# Patient Record
Sex: Female | Born: 1950 | Race: Black or African American | Hispanic: No | Smoking: Current every day smoker
Health system: Southern US, Community
[De-identification: ages and names within clinical notes are randomized; demographics above are authoritative.]

## PROBLEM LIST (undated history)

## (undated) DIAGNOSIS — F329 Major depressive disorder, single episode, unspecified: Secondary | ICD-10-CM

## (undated) DIAGNOSIS — G4730 Sleep apnea, unspecified: Secondary | ICD-10-CM

## (undated) DIAGNOSIS — I10 Essential (primary) hypertension: Secondary | ICD-10-CM

## (undated) DIAGNOSIS — E785 Hyperlipidemia, unspecified: Secondary | ICD-10-CM

## (undated) DIAGNOSIS — K219 Gastro-esophageal reflux disease without esophagitis: Secondary | ICD-10-CM

## (undated) DIAGNOSIS — K5790 Diverticulosis of intestine, part unspecified, without perforation or abscess without bleeding: Secondary | ICD-10-CM

## (undated) DIAGNOSIS — G5601 Carpal tunnel syndrome, right upper limb: Secondary | ICD-10-CM

## (undated) DIAGNOSIS — K5520 Angiodysplasia of colon without hemorrhage: Secondary | ICD-10-CM

## (undated) DIAGNOSIS — R519 Headache, unspecified: Secondary | ICD-10-CM

## (undated) DIAGNOSIS — F32A Depression, unspecified: Secondary | ICD-10-CM

## (undated) DIAGNOSIS — E079 Disorder of thyroid, unspecified: Secondary | ICD-10-CM

## (undated) DIAGNOSIS — N289 Disorder of kidney and ureter, unspecified: Secondary | ICD-10-CM

## (undated) DIAGNOSIS — G4700 Insomnia, unspecified: Secondary | ICD-10-CM

## (undated) DIAGNOSIS — R809 Proteinuria, unspecified: Secondary | ICD-10-CM

## (undated) HISTORY — DX: Diverticulosis of intestine, part unspecified, without perforation or abscess without bleeding: K57.90

## (undated) HISTORY — DX: Hyperlipidemia, unspecified: E78.5

## (undated) HISTORY — DX: Proteinuria, unspecified: R80.9

## (undated) HISTORY — DX: Insomnia, unspecified: G47.00

## (undated) HISTORY — DX: Headache, unspecified: R51.9

## (undated) HISTORY — DX: Gastro-esophageal reflux disease without esophagitis: K21.9

## (undated) HISTORY — DX: Essential (primary) hypertension: I10

## (undated) HISTORY — DX: Angiodysplasia of colon without hemorrhage: K55.20

## (undated) HISTORY — DX: Sleep apnea, unspecified: G47.30

## (undated) HISTORY — PX: BACK SURGERY: SHX140

## (undated) HISTORY — PX: KNEE ARTHROSCOPY: SUR90

## (undated) HISTORY — DX: Carpal tunnel syndrome, right upper limb: G56.01

## (undated) HISTORY — DX: Disorder of thyroid, unspecified: E07.9

## (undated) HISTORY — PX: TOTAL ABDOMINAL HYSTERECTOMY W/ BILATERAL SALPINGOOPHORECTOMY: SHX83

## (undated) HISTORY — PX: BILATERAL OOPHORECTOMY: SHX1221

## (undated) HISTORY — DX: Disorder of kidney and ureter, unspecified: N28.9

## (undated) HISTORY — DX: Depression, unspecified: F32.A

## (undated) HISTORY — DX: Major depressive disorder, single episode, unspecified: F32.9

---

## 1982-10-16 HISTORY — PX: ABDOMINAL HYSTERECTOMY: SHX81

## 2000-05-08 ENCOUNTER — Other Ambulatory Visit: Admission: RE | Admit: 2000-05-08 | Discharge: 2000-05-08 | Payer: Self-pay | Admitting: *Deleted

## 2001-03-25 ENCOUNTER — Encounter: Payer: Self-pay | Admitting: Family Medicine

## 2001-03-25 ENCOUNTER — Ambulatory Visit (HOSPITAL_COMMUNITY): Admission: RE | Admit: 2001-03-25 | Discharge: 2001-03-25 | Payer: Self-pay | Admitting: Family Medicine

## 2001-07-04 ENCOUNTER — Emergency Department (HOSPITAL_COMMUNITY): Admission: EM | Admit: 2001-07-04 | Discharge: 2001-07-04 | Payer: Self-pay | Admitting: Emergency Medicine

## 2002-08-29 ENCOUNTER — Ambulatory Visit (HOSPITAL_COMMUNITY): Admission: RE | Admit: 2002-08-29 | Discharge: 2002-08-29 | Payer: Self-pay | Admitting: Family Medicine

## 2002-08-29 ENCOUNTER — Encounter: Payer: Self-pay | Admitting: Family Medicine

## 2003-09-03 ENCOUNTER — Ambulatory Visit (HOSPITAL_COMMUNITY): Admission: RE | Admit: 2003-09-03 | Discharge: 2003-09-03 | Payer: Self-pay | Admitting: Family Medicine

## 2005-01-03 ENCOUNTER — Ambulatory Visit (HOSPITAL_COMMUNITY): Admission: RE | Admit: 2005-01-03 | Discharge: 2005-01-03 | Payer: Self-pay | Admitting: Family Medicine

## 2005-01-17 ENCOUNTER — Encounter: Admission: RE | Admit: 2005-01-17 | Discharge: 2005-01-17 | Payer: Self-pay | Admitting: Family Medicine

## 2005-03-09 ENCOUNTER — Ambulatory Visit (HOSPITAL_COMMUNITY): Admission: RE | Admit: 2005-03-09 | Discharge: 2005-03-09 | Payer: Self-pay | Admitting: Family Medicine

## 2005-08-07 ENCOUNTER — Emergency Department (HOSPITAL_COMMUNITY): Admission: EM | Admit: 2005-08-07 | Discharge: 2005-08-07 | Payer: Self-pay | Admitting: Emergency Medicine

## 2005-08-09 ENCOUNTER — Ambulatory Visit (HOSPITAL_COMMUNITY): Admission: RE | Admit: 2005-08-09 | Discharge: 2005-08-09 | Payer: Self-pay | Admitting: Family Medicine

## 2005-12-21 ENCOUNTER — Ambulatory Visit (HOSPITAL_COMMUNITY): Admission: RE | Admit: 2005-12-21 | Discharge: 2005-12-21 | Payer: Self-pay | Admitting: Family Medicine

## 2007-05-16 ENCOUNTER — Ambulatory Visit (HOSPITAL_BASED_OUTPATIENT_CLINIC_OR_DEPARTMENT_OTHER): Admission: RE | Admit: 2007-05-16 | Discharge: 2007-05-16 | Payer: Self-pay | Admitting: Orthopedic Surgery

## 2007-11-08 ENCOUNTER — Ambulatory Visit (HOSPITAL_COMMUNITY): Admission: RE | Admit: 2007-11-08 | Discharge: 2007-11-08 | Payer: Self-pay | Admitting: Family Medicine

## 2007-11-22 ENCOUNTER — Encounter: Admission: RE | Admit: 2007-11-22 | Discharge: 2007-11-22 | Payer: Self-pay | Admitting: Family Medicine

## 2008-10-16 DIAGNOSIS — N289 Disorder of kidney and ureter, unspecified: Secondary | ICD-10-CM

## 2008-10-16 HISTORY — DX: Disorder of kidney and ureter, unspecified: N28.9

## 2008-11-10 ENCOUNTER — Ambulatory Visit (HOSPITAL_COMMUNITY): Admission: RE | Admit: 2008-11-10 | Discharge: 2008-11-10 | Payer: Self-pay | Admitting: Family Medicine

## 2008-11-17 ENCOUNTER — Ambulatory Visit (HOSPITAL_COMMUNITY): Admission: RE | Admit: 2008-11-17 | Discharge: 2008-11-17 | Payer: Self-pay | Admitting: Family Medicine

## 2008-12-29 ENCOUNTER — Ambulatory Visit: Payer: Self-pay | Admitting: Thoracic Surgery

## 2009-03-24 ENCOUNTER — Encounter: Admission: RE | Admit: 2009-03-24 | Discharge: 2009-03-24 | Payer: Self-pay | Admitting: Thoracic Surgery

## 2009-03-24 ENCOUNTER — Ambulatory Visit: Payer: Self-pay | Admitting: Thoracic Surgery

## 2009-12-22 ENCOUNTER — Encounter: Admission: RE | Admit: 2009-12-22 | Discharge: 2009-12-22 | Payer: Self-pay | Admitting: Cardiothoracic Surgery

## 2009-12-22 ENCOUNTER — Ambulatory Visit: Payer: Self-pay | Admitting: Thoracic Surgery

## 2011-01-10 ENCOUNTER — Ambulatory Visit (HOSPITAL_COMMUNITY)
Admission: RE | Admit: 2011-01-10 | Discharge: 2011-01-10 | Disposition: A | Discharge status: Home / Self Care | Payer: BC Managed Care – PPO | Source: Ambulatory Visit | Attending: Family Medicine | Admitting: Family Medicine

## 2011-01-10 ENCOUNTER — Other Ambulatory Visit (HOSPITAL_COMMUNITY): Payer: Self-pay | Admitting: Family Medicine

## 2011-01-10 DIAGNOSIS — M25559 Pain in unspecified hip: Secondary | ICD-10-CM | POA: Insufficient documentation

## 2011-01-10 DIAGNOSIS — M545 Low back pain, unspecified: Secondary | ICD-10-CM | POA: Insufficient documentation

## 2011-01-10 DIAGNOSIS — M5430 Sciatica, unspecified side: Secondary | ICD-10-CM

## 2011-02-28 NOTE — Op Note (Signed)
Joanna [name], Draghi Asmaa                  ACCOUNT NO.:  [id number]   MEDICAL RECORD NO.:  [id number]          PATIENT TYPE:  AMB   LOCATION:  [facility]                         FACILITY:  [facility]   PHYSICIAN:  Gaynelle Arabian, M.D.    DATE OF BIRTH:  24-Mar-1951   DATE OF PROCEDURE:  05/16/2007  DATE OF DISCHARGE:                               OPERATIVE REPORT   PREOPERATIVE DIAGNOSIS:  Right knee medial meniscal tear.   POSTOPERATIVE DIAGNOSIS:  Right knee medial meniscal tear.   PROCEDURE:  Right knee arthroscopy with debridement.   SURGEON:  Gaynelle Arabian, M.D.   ASSISTANT:  None.   ANESTHESIA:  General.   BLOOD LOSS:  Minimal.   DRAINS:  None.   COMPLICATIONS:  None.   CONDITION:  Stable to the recovery room.   CLINICAL NOTE:  [name] is a 60 year old female with a long history right  knee pain and mechanical symptoms.  She had a medial meniscal tear with  progressively worsening discomfort.  She presents now for arthroscopy  and debridement.   PROCEDURE IN DETAIL:  After the successful administration of general  anesthetic, a tourniquet was placed high on the right thigh.  The right  lower extremity was prepped and draped in the usual sterile fashion.  Standard superomedial and inferolateral incisions were made and inflow  cannula passed superomedial, camera passed inferolateral.  Arthroscopic  visualization proceeds.  The under surface of the patella and trochlea  looked normal.  The medial and lateral gutters were visualized and  looked normal.  Flexion and valgus force applied to the knee and the  medial compartment was entered.  The spinal needle was used to localize  the inferomedial portal.  A small incision made, dilator placed, and we  noted that there was a significant tear in the body and posterior horn  of the medial meniscus.  The chondral surfaces looked normal.  I used a  combination of baskets and 4.2 mm shaver to debride the meniscus back to  a stable base and then  sealed it off with the ArthroCare device.  It is  found to be stable post debridement.  Again, the chondral surfaces are  inspected and they looked normal.  I then visualized the intercondylar  notch and the ACL was normal.  The lateral compartment was also normal.  The joint was again inspected and there was no evidence of any other  tears, loose bodies, or chondral defects.  I then removed the  arthroscopic equipment from the inferior portals which are closed with  interrupted 4-0 nylon.  20 mL of 0.25% Marcaine with epi are injected  through the inflow cannula and then that is removed and that portal  closed with nylon.  A bulky sterile dressing is applied and she is  awakened and transported to recovery in stable condition.      Gaynelle Arabian, M.D.  Electronically Signed     FA/MEDQ  D:  05/16/2007  T:  05/16/2007  Job:  [id number]

## 2011-02-28 NOTE — Letter (Signed)
March 24, 2009   W. Rosemary Holms, MD  [address] [place], [place] [address]   Re:  [name], Joanna R                  DOB:  12-Jan-1951   Dear Dr. Wolfgang Phoenix:   I saw the patient back today.  Her blood pressure is 137/100, pulse 88,  respirations 18, and saturations were 95%.  Her lungs were clear to  auscultation and percussion.  Her CT scan showed no other change in her  right lower lobe nodule.  Because this has been stable and given the  size of this nodule, we will see her again in 9 months with a CT scan.   Nicanor Alcon, M.D.  Electronically Signed   DPB/MEDQ  D:  03/24/2009  T:  03/25/2009  Job:  [id number]

## 2011-02-28 NOTE — Letter (Signed)
December 29, 2008   [name]. Wolfgang Phoenix, [place], [place], [place] [address]   Re:  Joanna [name], Joanna [name] [place] R                  DOB:  03-22-1951   Dear Dr. Wolfgang Phoenix:   I appreciate the opportunity of seeing the patient.  She is a 60-year-  old smoker was found to have a 3-mm nodule on the right lower lobe on CT  scan.  She is referred here for evaluation.  This is not evidenced  apparently on previous x-rays.  She also has chronic disease and chronic  obstructive pulmonary disease.  Her problems include hypertension,  diabetes mellitus type 2, hypothyroidism, and depression.   ALLERGIES:  She has no allergies.   FAMILY HISTORY:  Positive for cardiac disease.   MEDICATIONS:  Norvasc 10 mg a day, Synthroid 0.15 mcg a day, potassium,  Demadex 20 mg a day, omeprazole 40 mg a day, Wellbutrin 150 mg twice a  day, and metformin 500 mg twice a day, Ambien 10 mg p.r.n., indapamide  2.5 mg daily, and benzoate p.r.n.   SOCIAL HISTORY:  She is single and has 2 children.  Works as an  Mining engineer.  She smokes a 13 pack a day.  Does not drink alcohol on a  regular basis.   REVIEW OF SYSTEMS:  Vital Signs:  She is 5 feet 2 inches.  Cardiac:  Her  weight has been stable.  Cardiac:  No angina or atrial fibrillation.  Pulmonary:  She has had bronchitis or wheezing.  GI:  She got reflux,  diarrhea, and constipation.  GU:  She had frequent urination.  No kidney  disease.  Vascular:  No claudication, DVT, or TIAs.  Neurological:  He  has got headaches.  No blackouts or seizures.  Musculoskeletal:  She has  had a rash.  No arthritis.  Psychiatric:  Depression.  Eye/ENT:  No  change in eyesight or hearing.  Hematological:  No problems with  bleeding, clotting disorders, or anemia.   PHYSICAL EXAMINATION:  VITAL SIGNS:  Her blood pressure is 132/92, pulse  100, respirations 18, and sats are 95%.  HEAD, EYES, EARS, NOSE, AND THROAT:  Unremarkable.  NECK:  Supple without thyromegaly.  There is no  supraclavicular or  axillary adenopathy.  LUNGS:  Clear to auscultation and percussion.  HEART:  Regular sinus rhythm.  No murmur.  ABDOMEN:  Soft.  There is no hepatosplenomegaly.  EXTREMITIES:  Pulses are 2+.  There is no clubbing or edema.  NEUROLOGICAL:  She is oriented x3.  Sensory and motor intact.  Cranial  nerves intact.   IMPRESSION:  I feel that a 3-mm nodule just needed to be followed.  I  did explain to her that there is no chest that we can do other than open  biopsy to find out about this fresh her and I would not recommend  surgery at this time.  We will see her again in 3 months with another CT  scan to explain to her if this starts getting larger, then intervention  would be recommended.   Nicanor Alcon, M.D.  Electronically Signed   DPB/MEDQ  D:  12/29/2008  T:  12/29/2008  Job:  [id number]

## 2011-02-28 NOTE — Letter (Signed)
December 22, 2009   [name]. Wolfgang Phoenix, MD  [address] [place], [place] [address]   Re:  Joanna [name], Joanna R                  DOB:  December 28, 1950   Dear Dr. Wolfgang Phoenix:   I saw the patient back today.  We were following her for a small 2-mm  nodule in the right lower lobe.  We got a chest x-ray today and really  did not show the nodule.  I feel since there is although that is a  really small nodule, I have seen that not increased in size over the  last 9 months.  I would recommend that she get a CT scan, chest x-ray at  least once a year for the next 2-3 years, and I will be happy to see her  back if there is any more concern regarding her nodule.  Her blood  pressure is 132/93, pulse 100, respirations 16, sats were 97%.   Joanna [name], M.D.  Electronically Signed   DPB/MEDQ  D:  12/22/2009  T:  12/23/2009  Job:  [id number]

## 2011-05-26 ENCOUNTER — Other Ambulatory Visit: Payer: Self-pay | Admitting: Family Medicine

## 2011-05-26 DIAGNOSIS — M549 Dorsalgia, unspecified: Secondary | ICD-10-CM

## 2011-05-30 ENCOUNTER — Ambulatory Visit (HOSPITAL_COMMUNITY)
Admission: RE | Admit: 2011-05-30 | Discharge: 2011-05-30 | Disposition: A | Discharge status: Home / Self Care | Payer: BC Managed Care – PPO | Source: Ambulatory Visit | Attending: Family Medicine | Admitting: Family Medicine

## 2011-05-30 DIAGNOSIS — M5126 Other intervertebral disc displacement, lumbar region: Secondary | ICD-10-CM | POA: Insufficient documentation

## 2011-05-30 DIAGNOSIS — M79609 Pain in unspecified limb: Secondary | ICD-10-CM | POA: Insufficient documentation

## 2011-05-30 DIAGNOSIS — M545 Low back pain, unspecified: Secondary | ICD-10-CM | POA: Insufficient documentation

## 2011-05-30 DIAGNOSIS — M549 Dorsalgia, unspecified: Secondary | ICD-10-CM

## 2011-07-31 LAB — POCT I-STAT 4, (NA,K, GLUC, HGB,HCT)
Glucose, Bld: 104 — ABNORMAL HIGH
HCT: 38
Hemoglobin: 12.9
Operator id: 268271
Potassium: 3.6
Sodium: 142

## 2011-12-22 ENCOUNTER — Telehealth: Payer: Self-pay

## 2011-12-22 NOTE — Telephone Encounter (Signed)
Per Crystal, Dr. [name] called and wanted this pt referred for colonoscopy. When I called pt, she is not having any rectal bleeding, diarrhea or constipation but is having some reflux. OV scheduled for 12/27/2011 with Vickey Huger, NP. Letter being sent to Dr. Wolfgang Phoenix to inform of appt.

## 2011-12-27 ENCOUNTER — Encounter: Payer: Self-pay | Admitting: Urgent Care

## 2011-12-27 ENCOUNTER — Ambulatory Visit (INDEPENDENT_AMBULATORY_CARE_PROVIDER_SITE_OTHER): Payer: BC Managed Care – PPO | Admitting: Urgent Care

## 2011-12-27 DIAGNOSIS — K219 Gastro-esophageal reflux disease without esophagitis: Secondary | ICD-10-CM

## 2011-12-27 DIAGNOSIS — Z1211 Encounter for screening for malignant neoplasm of colon: Secondary | ICD-10-CM

## 2011-12-27 MED ORDER — SOD PICOSULFATE-MAG OX-CIT ACD 10-3.5-12 MG-GM-GM PO PACK: 1.0000 | PACK | Freq: Once | ORAL | Status: DC

## 2011-12-27 NOTE — Progress Notes (Signed)
Referring Provider: [name], MD Primary Care Physician:  [name], MD, MD Primary Gastroenterologist:  Dr. Oneida Alar  Chief Complaint  Patient presents with  . Colonoscopy  . Gastrophageal Reflux    Refractory   HPI:  Joanna [name] is a 61 y.o. female here as a referral from Dr. Wolfgang Phoenix for screening colonoscopy. Upon triage by the nurse, she complains of refractory GERD. She was brought in the office for further evaluation. She describes a 12 yr hx of GERD.  She admits to eating late due to working 12-hr shifts.  +Heartburn several days per month.  Takes omeprazole 20mg  daily.  Not skipping doses.  Denies dysphagia or pdynophagia.  Denies anorexia, N/V.   Denies any lower GI symptoms including constipation, diarrhea, rectal bleeding, melena or weight loss. She an EGD 11 years ago elsewhere but does not remember findings. She denies any problems with sedation.  Past Medical History  Diagnosis Date  . GERD (gastroesophageal reflux disease)   . Diabetes mellitus   . Hypertension   . Depression   . Insomnia     Past Surgical History  Procedure Date  . Total abdominal hysterectomy w/ bilateral salpingoophorectomy   . Knee arthroscopy     Current Outpatient Prescriptions  Medication Sig Dispense Refill  . buPROPion (WELLBUTRIN SR) 150 MG 12 hr tablet Take 150 mg by mouth 2 (two) times daily. for tobacco cessation      . HYDROcodone-acetaminophen (NORCO) 10-325 MG per tablet Take 1 tablet by mouth every 6 (six) hours as needed.       . indapamide (LOZOL) 2.5 MG tablet Take 2.5 mg by mouth every morning.      Marland Kitchen ketorolac (TORADOL) 10 MG tablet Take 10 mg by mouth Once daily as needed.      Marland Kitchen levothyroxine (SYNTHROID, LEVOTHROID) 100 MCG tablet Take 100 mcg by mouth daily.       Marland Kitchen losartan (COZAAR) 100 MG tablet Take 100 mg by mouth daily.       . metFORMIN (GLUCOPHAGE) 500 MG tablet Take 500 mg by mouth 2 (two) times daily with a meal.       . omeprazole (PRILOSEC) 40 MG capsule  Take 40 mg by mouth daily.       . pravastatin (PRAVACHOL) 80 MG tablet Take 40 mg by mouth daily. 1/2 tablet      . triamcinolone cream (KENALOG) 0.1 %       . zolpidem (AMBIEN) 5 MG tablet Take 5 mg by mouth at bedtime as needed. 2 tablets at bedtime-only when she works nights       . Sod Picosulfate-Mag Ox-Cit Acd ([place]) 10-3.5-12 MG-GM-GM PACK Take 1 kit by mouth once.  1 each  0    Allergies as of 12/27/2011  . (No Known Allergies)    Family History:There is no known family history of colorectal carcinoma , liver disease, or inflammatory bowel disease.  Problem Relation Age of Onset  . Cardiomyopathy Father   . Heart failure Mother   . Stroke Brother   . Kidney cancer Brother     History   Social History  . Marital Status: Divorced    Spouse Name: N/A    Number of Children: 2  . 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 Smoking status: Former Smoker -- 0.5 packs/day for 30 years    Types: Cigarettes    Quit date: 10/16/2010  .  Smokeless tobacco: Former Systems developer    Quit date: 10/28/2010  . Alcohol Use: Yes     once per mo, socially  . Drug Use: No  . Sexually Active: Not on file  Review of Systems: Gen: Denies any fever, chills, sweats, anorexia, fatigue, weakness, malaise, weight loss, and sleep disorder CV: Denies chest pain, angina, palpitations, syncope, orthopnea, PND, peripheral edema, and claudication. Resp: Denies dyspnea at rest, dyspnea with exercise, cough, sputum, wheezing, coughing up blood, and pleurisy. GI: Denies vomiting blood, jaundice, and fecal incontinence.  GU : Denies urinary burning, blood in urine, urinary frequency, urinary hesitancy, nocturnal urination, and urinary incontinence. MS: Denies joint pain, limitation of movement, and swelling, stiffness, low back pain, extremity pain. Denies muscle weakness, cramps, atrophy.  Derm: Denies rash, itching, dry skin, hives, moles, warts, or  unhealing ulcers.  Psych: Denies anxiety, memory loss, suicidal ideation, hallucinations, paranoia, and confusion. Heme: Denies bruising, bleeding, and enlarged lymph nodes. Neuro:  Denies any headaches, dizziness, paresthesias. Endo:  Denies any problems with DM, thyroid, adrenal function.  Physical Exam: BP 121/80  Pulse 114  Temp(Src) 97.3 F (36.3 C) (Temporal)  Ht 5\' 2"  (1.575 m)  Wt 185 lb (83.915 kg)  BMI 33.84 kg/m2 General:   Alert,  Well-developed, well-nourished, pleasant and cooperative in NAD Head:  Normocephalic and atraumatic. Eyes:  Sclera clear, no icterus.   Conjunctiva pink. Ears:  Normal auditory acuity. Nose:  No deformity, discharge, or lesions. Mouth:  No deformity or lesions,oropharynx pink & moist. Neck:  Supple; no masses or thyromegaly. Lungs:  Clear throughout to auscultation.   No wheezes, crackles, or rhonchi. No acute distress. Heart:  Regular rate and rhythm; no murmurs, clicks, rubs,  or gallops. Abdomen:  Normal bowel sounds.  No bruits.  Soft, non-tender and non-distended without masses, hepatosplenomegaly or hernias noted.  No guarding or rebound tenderness.   Rectal:  Deferred. Msk:  Symmetrical without gross deformities. Normal posture. Pulses:  Normal pulses noted. Extremities:  No clubbing or edema. Neurologic:  Alert and oriented x4;  grossly normal neurologically. Skin:  Intact without significant lesions or rashes. Lymph Nodes:  No significant cervical adenopathy. Psych:  Alert and cooperative. Normal mood and affect.

## 2011-12-27 NOTE — Progress Notes (Signed)
Faxed to PCP

## 2011-12-27 NOTE — Patient Instructions (Signed)
You need a colonoscopy & EGD with Dr. Oneida Alar  Continue omeprazole 20mg  daily for now, this may need to be increased or changed after procedure  Gastroesophageal Reflux Disease, Adult Gastroesophageal reflux disease (GERD) happens when acid from your stomach flows up into the esophagus. When acid comes in contact with the esophagus, the acid causes soreness (inflammation) in the esophagus. Over time, GERD may create small holes (ulcers) in the lining of the esophagus. CAUSES   Increased body weight. This puts pressure on the stomach, making acid rise from the stomach into the esophagus.   Smoking. This increases acid production in the stomach.   Drinking alcohol. This causes decreased pressure in the lower esophageal sphincter (valve or ring of muscle between the esophagus and stomach), allowing acid from the stomach into the esophagus.   Late evening meals and a full stomach. This increases pressure and acid production in the stomach.   A malformed lower esophageal sphincter.  Sometimes, no cause is found. SYMPTOMS   Burning pain in the lower part of the mid-chest behind the breastbone and in the mid-stomach area. This may occur twice a week or more often.   Trouble swallowing.   Sore throat.   Dry cough.   Asthma-like symptoms including chest tightness, shortness of breath, or wheezing.  DIAGNOSIS  Your caregiver may be able to diagnose GERD based on your symptoms. In some cases, X-rays and other tests may be done to check for complications or to check the condition of your stomach and esophagus. TREATMENT  Your caregiver may recommend over-the-counter or prescription medicines to help decrease acid production. Ask your caregiver before starting or adding any new medicines.  HOME CARE INSTRUCTIONS   Change the factors that you can control. Ask your caregiver for guidance concerning weight loss, quitting smoking, and alcohol consumption.   Avoid foods and drinks that make your  symptoms worse, such as:   Caffeine or alcoholic drinks.   Chocolate.   Peppermint or mint flavorings.   Garlic and onions.   Spicy foods.   Citrus fruits, such as oranges, lemons, or limes.   Tomato-based foods such as sauce, chili, salsa, and pizza.   Fried and fatty foods.   Avoid lying down for the 3 hours prior to your bedtime or prior to taking a nap.   Eat small, frequent meals instead of large meals.   Wear loose-fitting clothing. Do not wear anything tight around your waist that causes pressure on your stomach.   Raise the head of your bed 6 to 8 inches with wood blocks to help you sleep. Extra pillows will not help.   Only take over-the-counter or prescription medicines for pain, discomfort, or fever as directed by your caregiver.   Do not take aspirin, ibuprofen, or other nonsteroidal anti-inflammatory drugs (NSAIDs).  SEEK IMMEDIATE MEDICAL CARE IF:   You have pain in your arms, neck, jaw, teeth, or back.   Your pain increases or changes in intensity or duration.   You develop nausea, vomiting, or sweating (diaphoresis).   You develop shortness of breath, or you faint.   Your vomit is green, yellow, black, or looks like coffee grounds or blood.   Your stool is red, bloody, or black.  These symptoms could be signs of other problems, such as heart disease, gastric bleeding, or esophageal bleeding. MAKE SURE YOU:   Understand these instructions.   Will watch your condition.   Will get help right away if you are not doing well or get  worse.  Document Released: 07/12/2005 Document Revised: 09/21/2011 Document Reviewed: 04/21/2011 [facility] Patient Information 2012 [place].

## 2011-12-27 NOTE — Assessment & Plan Note (Signed)
12 history of chronic GERD with refractory heartburn despite daily omeprazole. EGD for further evaluation rule out complicated esophageal reflux disease.  I have discussed risks & benefits which include, but are not limited to, bleeding, infection, perforation & drug reaction.  The patient agrees with this plan & written consent will be obtained.    Continue omeprazole 20 mg daily, although PPI may need to be increased to twice a day or change to a different agent pending endoscopic findings. GERD precautions given including no late night eating.

## 2011-12-27 NOTE — Assessment & Plan Note (Signed)
Joanna [name] is a pleasant 61 y.o. female due for screening colonoscopy.  She denies any lower GI concerns.  I have discussed risks & benefits which include, but are not limited to, bleeding, infection, perforation & drug reaction.  The patient agrees with this plan & written consent will be obtained.

## 2012-01-26 MED ORDER — SODIUM CHLORIDE 0.45 % IV SOLN
Freq: Once | INTRAVENOUS | Status: AC
  Administered 2012-01-29: 10:00:00 via INTRAVENOUS

## 2012-01-28 NOTE — Progress Notes (Signed)
REVIEWED.  

## 2012-01-29 ENCOUNTER — Encounter (HOSPITAL_COMMUNITY)
Admission: RE | Disposition: A | Discharge status: Home / Self Care | Payer: Self-pay | Source: Ambulatory Visit | Attending: Gastroenterology

## 2012-01-29 ENCOUNTER — Encounter (HOSPITAL_COMMUNITY): Payer: Self-pay | Admitting: *Deleted

## 2012-01-29 ENCOUNTER — Ambulatory Visit (HOSPITAL_COMMUNITY)
Admission: RE | Admit: 2012-01-29 | Discharge: 2012-01-29 | Disposition: A | Discharge status: Home / Self Care | Payer: BC Managed Care – PPO | Source: Ambulatory Visit | Attending: Gastroenterology | Admitting: Gastroenterology

## 2012-01-29 DIAGNOSIS — D126 Benign neoplasm of colon, unspecified: Secondary | ICD-10-CM

## 2012-01-29 DIAGNOSIS — Z1211 Encounter for screening for malignant neoplasm of colon: Secondary | ICD-10-CM | POA: Insufficient documentation

## 2012-01-29 DIAGNOSIS — K5730 Diverticulosis of large intestine without perforation or abscess without bleeding: Secondary | ICD-10-CM | POA: Insufficient documentation

## 2012-01-29 DIAGNOSIS — K2970 Gastritis, unspecified, without bleeding: Secondary | ICD-10-CM

## 2012-01-29 DIAGNOSIS — K3189 Other diseases of stomach and duodenum: Secondary | ICD-10-CM

## 2012-01-29 DIAGNOSIS — K449 Diaphragmatic hernia without obstruction or gangrene: Secondary | ICD-10-CM | POA: Insufficient documentation

## 2012-01-29 DIAGNOSIS — K2990 Gastroduodenitis, unspecified, without bleeding: Secondary | ICD-10-CM

## 2012-01-29 DIAGNOSIS — I10 Essential (primary) hypertension: Secondary | ICD-10-CM | POA: Insufficient documentation

## 2012-01-29 DIAGNOSIS — K621 Rectal polyp: Secondary | ICD-10-CM

## 2012-01-29 DIAGNOSIS — D128 Benign neoplasm of rectum: Secondary | ICD-10-CM | POA: Insufficient documentation

## 2012-01-29 DIAGNOSIS — K620 Anal polyp: Secondary | ICD-10-CM

## 2012-01-29 DIAGNOSIS — K219 Gastro-esophageal reflux disease without esophagitis: Secondary | ICD-10-CM

## 2012-01-29 DIAGNOSIS — R1013 Epigastric pain: Secondary | ICD-10-CM

## 2012-01-29 DIAGNOSIS — K2940 Chronic atrophic gastritis without bleeding: Secondary | ICD-10-CM | POA: Insufficient documentation

## 2012-01-29 DIAGNOSIS — Z79899 Other long term (current) drug therapy: Secondary | ICD-10-CM | POA: Insufficient documentation

## 2012-01-29 DIAGNOSIS — K648 Other hemorrhoids: Secondary | ICD-10-CM | POA: Insufficient documentation

## 2012-01-29 DIAGNOSIS — Z6833 Body mass index (BMI) 33.0-33.9, adult: Secondary | ICD-10-CM | POA: Insufficient documentation

## 2012-01-29 DIAGNOSIS — E119 Type 2 diabetes mellitus without complications: Secondary | ICD-10-CM | POA: Insufficient documentation

## 2012-01-29 HISTORY — PX: ESOPHAGOGASTRODUODENOSCOPY: SHX1529

## 2012-01-29 HISTORY — PX: COLONOSCOPY: SHX174

## 2012-01-29 LAB — GLUCOSE, CAPILLARY: Glucose-Capillary: 119 mg/dL — ABNORMAL HIGH (ref 70–99)

## 2012-01-29 SURGERY — COLONOSCOPY WITH ESOPHAGOGASTRODUODENOSCOPY (EGD)
Anesthesia: Moderate Sedation

## 2012-01-29 MED ORDER — MEPERIDINE HCL 100 MG/ML IJ SOLN
INTRAMUSCULAR | Status: DC | PRN
  Administered 2012-01-29: 25 mg via INTRAVENOUS
  Administered 2012-01-29: 50 mg via INTRAVENOUS

## 2012-01-29 MED ORDER — STERILE WATER FOR IRRIGATION IR SOLN
Status: DC | PRN
  Administered 2012-01-29: 11:00:00

## 2012-01-29 MED ORDER — PROMETHAZINE HCL 25 MG/ML IJ SOLN
INTRAMUSCULAR | Status: DC | PRN
  Administered 2012-01-29: 12.5 mg via INTRAVENOUS

## 2012-01-29 MED ORDER — MEPERIDINE HCL 100 MG/ML IJ SOLN
INTRAMUSCULAR | Status: AC
  Filled 2012-01-29: qty 2

## 2012-01-29 MED ORDER — MIDAZOLAM HCL 5 MG/5ML IJ SOLN
INTRAMUSCULAR | Status: AC
  Filled 2012-01-29: qty 10

## 2012-01-29 MED ORDER — HYDRALAZINE HCL 20 MG/ML IJ SOLN
10.0000 mg | Freq: Once | INTRAMUSCULAR | Status: AC
  Administered 2012-01-29: 10 mg via INTRAVENOUS

## 2012-01-29 MED ORDER — HYDRALAZINE HCL 20 MG/ML IJ SOLN
INTRAMUSCULAR | Status: AC
  Filled 2012-01-29: qty 1

## 2012-01-29 MED ORDER — MIDAZOLAM HCL 5 MG/5ML IJ SOLN
INTRAMUSCULAR | Status: DC | PRN
  Administered 2012-01-29: 1 mg via INTRAVENOUS
  Administered 2012-01-29: 2 mg via INTRAVENOUS
  Administered 2012-01-29 (×2): 1 mg via INTRAVENOUS

## 2012-01-29 NOTE — Discharge Instructions (Signed)
YOUR UPPER ENDOSCOPY SHOWED A HIATAL HERNIA AND GASTRITIS DUE TO ASPIRIN USE. YOU HAD 3 small POLYPs REMOVED. You have internal hemorrhoids, WHICH can CAUSE RECTAL BLEEDING. I biopsied your stomach, & colon.  CONTINUE OMEPRAZOLE.  AVOID ITEMS THAT TRIGGER GASTRITIS. SEE INFO BELOW.  FOLLOW YOUR HIGH FIBER/LOW FAT DIET. AVOID ITEMS THAT CAUSE BLOATING. SEE INFO BELOW.  LOSE 10 TO 20 LBS. IT WILL HELP YOUR REFLUX BE BETTER CONTROLLED AND DECREASE YOUR RISK FOR COLON CANCER.  YOUR BIOPSY RESULTS WILL BE BACK IN 7 DAYS.  FOLLOW UP IN 4 MOS.   ENDOSCOPY Care After Read the instructions outlined below and refer to this sheet in the next week. These discharge instructions provide you with general information on caring for yourself after you leave the hospital. While your treatment has been planned according to the most current medical practices available, unavoidable complications occasionally occur. If you have any problems or questions after discharge, call DR. Gudrun Axe, [phone].  ACTIVITY  You may resume your regular activity, but move at a slower pace for the next 24 hours.   Take frequent rest periods for the next 24 hours.   Walking will help get rid of the air and reduce the bloated feeling in your belly (abdomen).   No driving for 24 hours (because of the medicine (anesthesia) used during the test).   You may shower.   Do not sign any important legal documents or operate any machinery for 24 hours (because of the anesthesia used during the test).    NUTRITION  Drink plenty of fluids.   You may resume your normal diet as instructed by your doctor.   Begin with a light meal and progress to your normal diet. Heavy or fried foods are harder to digest and may make you feel sick to your stomach (nauseated).   Avoid alcoholic beverages for 24 hours or as instructed.    MEDICATIONS  You may resume your normal medications.   WHAT YOU CAN EXPECT TODAY  Some feelings of  bloating in the abdomen.   Passage of more gas than usual.   Spotting of blood in your stool or on the toilet paper  .  IF YOU HAD POLYPS REMOVED DURING THE ENDOSCOPY:  Eat a soft diet IF YOU HAVE NAUSEA, BLOATING, ABDOMINAL PAIN, OR VOMITING.    FINDING OUT THE RESULTS OF YOUR TEST Not all test results are available during your visit. DR. Oneida Alar WILL CALL YOU WITHIN 7 DAYS OF YOUR PROCEDUE WITH YOUR RESULTS. Do not assume everything is normal if you have not heard from DR. Gyneth Hubka IN ONE WEEK, CALL HER OFFICE AT [phone].  SEEK IMMEDIATE MEDICAL ATTENTION AND CALL THE OFFICE: [phone] IF:  You have more than a spotting of blood in your stool.   Your belly is swollen (abdominal distention).   You are nauseated or vomiting.   You have a temperature over 101F.   You have abdominal pain or discomfort that is severe or gets worse throughout the day.  Polyps, Colon  A polyp is extra tissue that grows inside your body. Colon polyps grow in the large intestine. The large intestine, also called the colon, is part of your digestive system. It is a long, hollow tube at the end of your digestive tract where your body makes and stores stool. Most polyps are not dangerous. They are benign. This means they are not cancerous. But over time, some types of polyps can turn into cancer. Polyps that are smaller than a  pea are usually not harmful. But larger polyps could someday become or may already be cancerous. To be safe, doctors remove all polyps and test them.   WHO GETS POLYPS? Anyone can get polyps, but certain people are more likely than others. You may have a greater chance of getting polyps if:  You are over 50.   You have had polyps before.   Someone in your family has had polyps.   Someone in your family has had cancer of the large intestine.   Find out if someone in your family has had polyps. You may also be more likely to get polyps if you:   Eat a lot of fatty foods    Smoke   Drink alcohol   Do not exercise  Eat too much   TREATMENT  The caregiver will remove the polyp during sigmoidoscopy or colonoscopy.  PREVENTION There is not one sure way to prevent polyps. You might be able to lower your risk of getting them if you:  Eat more fruits and vegetables and less fatty food.   Do not smoke.   Avoid alcohol.   Exercise every day.   Lose weight if you are overweight.   Eating more calcium and folate can also lower your risk of getting polyps. Some foods that are rich in calcium are milk, cheese, and broccoli. Some foods that are rich in folate are chickpeas, kidney beans, and spinach.     Gastritis  Gastritis is an inflammation (the body's way of reacting to injury and/or infection) of the stomach. It is often caused by viral or bacterial (germ) infections. It can also be caused BY ASPIRIN, TORADOL, BC/GOODY POWDER'S, (IBUPROFEN) MOTRIN, OR ALEVE (NAPROXEN), chemicals (including alcohol), SPICY FOODS, and medications. This illness may be associated with generalized malaise (feeling tired, not well), UPPER ABDOMINAL STOMACH cramps, and fever. One common bacterial cause of gastritis is an organism known as H. Pylori. This can be treated with antibiotics.   Hiatal Hernia A hiatal hernia occurs when a part of the stomach slides above the diaphragm. The diaphragm is the thin muscle separating the belly (abdomen) from the chest. A hiatal hernia can be something you are born with or develop over time. Hiatal hernias may allow stomach acid to flow back into your esophagus, the tube which carries food from your mouth to your stomach. If this acid causes problems it is called GERD (gastro-esophageal reflux disease).   SYMPTOMS Common symptoms of GERD are heartburn (burning in your chest). This is worse when lying down or bending over. It may also cause belching and indigestion. Some of the things which make GERD worse are:  Increased weight pushes on  stomach making acid rise more easily.   Smoking markedly increases acid production.   Alcohol decreases lower esophageal sphincter pressure (valve between stomach and esophagus), allowing acid from stomach into esophagus.   Late evening meals and going to bed with a full stomach increases pressure.   Anything that causes an increase in acid production.    HOME CARE INSTRUCTIONS  Try to achieve and maintain an ideal body weight.   Avoid drinking alcoholic beverages.   DO NOT smokE.   Do not wear tight clothing around your chest or stomach.   Eat smaller meals and eat more frequently. This keeps your stomach from getting too full. Eat slowly.   Do not lie down for 2 or 3 hours after eating. Do not eat or drink anything 1 to 2 hours before going  to bed.   Avoid caffeine beverages (colas, coffee, cocoa, tea), fatty foods, citrus fruits and all other foods and drinks that contain acid and that seem to increase the problems.   Avoid bending over, especially after eating OR STRAINING. Anything that increases the pressure in your belly increases the amount of acid that may be pushed up into your esophagus.    High-Fiber Diet A high-fiber diet changes your normal diet to include more whole grains, legumes, fruits, and vegetables. Changes in the diet involve replacing refined carbohydrates with unrefined foods. The calorie level of the diet is essentially unchanged. The Dietary Reference Intake (recommended amount) for adult males is 38 grams per day. For adult females, it is 25 grams per day. Pregnant and lactating women should consume 28 grams of fiber per day. Fiber is the intact part of a plant that is not broken down during digestion. Functional fiber is fiber that has been isolated from the plant to provide a beneficial effect in the body. PURPOSE  Increase stool bulk.   Ease and regulate bowel movements.   Lower cholesterol.  INDICATIONS THAT YOU NEED MORE FIBER  Constipation  and hemorrhoids.   Uncomplicated diverticulosis (intestine condition) and irritable bowel syndrome.   Weight management.   As a protective measure against hardening of the arteries (atherosclerosis), diabetes, and cancer.   GUIDELINES FOR INCREASING FIBER IN THE DIET  Start adding fiber to the diet slowly. A gradual increase of about 5 more grams (2 slices of whole-wheat bread, 2 servings of most fruits or vegetables, or 1 bowl of high-fiber cereal) per day is best. Too rapid an increase in fiber may result in constipation, flatulence, and bloating.   Drink enough water and fluids to keep your urine clear or pale yellow. Water, juice, or caffeine-free drinks are recommended. Not drinking enough fluid may cause constipation.   Eat a variety of high-fiber foods rather than one type of fiber.   Try to increase your intake of fiber through using high-fiber foods rather than fiber pills or supplements that contain small amounts of fiber.   The goal is to change the types of food eaten. Do not supplement your present diet with high-fiber foods, but replace foods in your present diet.  INCLUDE A VARIETY OF FIBER SOURCES  Replace refined and processed grains with whole grains, canned fruits with fresh fruits, and incorporate other fiber sources. White rice, white breads, and most bakery goods contain little or no fiber.   Brown whole-grain rice, buckwheat oats, and many fruits and vegetables are all good sources of fiber. These include: broccoli, Brussels sprouts, cabbage, cauliflower, beets, sweet potatoes, white potatoes (skin on), carrots, tomatoes, eggplant, squash, berries, fresh fruits, and dried fruits.   Cereals appear to be the richest source of fiber. Cereal fiber is found in whole grains and bran. Bran is the fiber-rich outer coat of cereal grain, which is largely removed in refining. In whole-grain cereals, the bran remains. In breakfast cereals, the largest amount of fiber is found in  those with "bran" in their names. The fiber content is sometimes indicated on the label.   You may need to include additional fruits and vegetables each day.   In baking, for 1 cup white flour, you may use the following substitutions:   1 cup whole-wheat flour minus 2 tablespoons.   1/2 cup white flour plus 1/2 cup whole-wheat flour.   Low-Fat Diet BREADS, CEREALS, PASTA, RICE, DRIED PEAS, AND BEANS These products are high in carbohydrates  and most are low in fat. Therefore, they can be increased in the diet as substitutes for fatty foods. They too, however, contain calories and should not be eaten in excess. Cereals can be eaten for snacks as well as for breakfast.  Include foods that contain fiber (fruits, vegetables, whole grains, and legumes). Research shows that fiber may lower blood cholesterol levels, especially the water-soluble fiber found in fruits, vegetables, oat products, and legumes. FRUITS AND VEGETABLES It is good to eat fruits and vegetables. Besides being sources of fiber, both are rich in vitamins and some minerals. They help you get the daily allowances of these nutrients. Fruits and vegetables can be used for snacks and desserts. MEATS Limit lean meat, chicken, [place], and fish to no more than 6 ounces per day. Beef, Pork, and Lamb Use lean cuts of beef, pork, and lamb. Lean cuts include:  Extra-lean ground beef.  Arm roast.  Sirloin tip.  Center-cut ham.  Round steak.  Loin chops.  Rump roast.  Tenderloin.  Trim all fat off the outside of meats before cooking. It is not necessary to severely decrease the intake of red meat, but lean choices should be made. Lean meat is rich in protein and contains a highly absorbable form of iron. Premenopausal women, in particular, should avoid reducing lean red meat because this could increase the risk for low red blood cells (iron-deficiency anemia). The organ meats, such as liver, sweetbreads, kidneys, and brain are very rich in  cholesterol. They should be limited. Chicken and [place] These are good sources of protein. The fat of poultry can be reduced by removing the skin and underlying fat layers before cooking. Chicken and [place] can be substituted for lean red meat in the diet. Poultry should not be fried or covered with high-fat sauces. Fish and Shellfish Fish is a good source of protein. Shellfish contain cholesterol, but they usually are low in saturated fatty acids. The preparation of fish is important. Like chicken and [place], they should not be fried or covered with high-fat sauces. EGGS Egg whites contain no fat or cholesterol. They can be eaten often. Try 1 to 2 egg whites instead of whole eggs in recipes or use egg substitutes that do not contain yolk. MILK AND DAIRY PRODUCTS Use skim or 1% milk instead of 2% or whole milk. Decrease whole milk, natural, and processed cheeses. Use nonfat or low-fat (2%) cottage cheese or low-fat cheeses made from vegetable oils. Choose nonfat or low-fat (1 to 2%) yogurt. Experiment with evaporated skim milk in recipes that call for heavy cream. Substitute low-fat yogurt or low-fat cottage cheese for sour cream in dips and salad dressings. Have at least 2 servings of low-fat dairy products, such as 2 glasses of skim (or 1%) milk each day to help get your daily calcium intake.  FATS AND OILS Reduce the total intake of fats, especially saturated fat. Butterfat, lard, and beef fats are high in saturated fat and cholesterol. These should be avoided as much as possible. Vegetable fats do not contain cholesterol, but certain vegetable fats, such as coconut oil, palm oil, and palm kernel oil are very high in saturated fats. These should be limited. These fats are often used in bakery goods, processed foods, popcorn, oils, and nondairy creamers. Vegetable shortenings and some peanut butters contain hydrogenated oils, which are also saturated fats. Read the labels on these foods and check for  saturated vegetable oils. Unsaturated vegetable oils and fats do not raise blood cholesterol. However, they  should be limited because they are fats and are high in calories. Total fat should still be limited to 30% of your daily caloric intake. Desirable liquid vegetable oils are corn oil, cottonseed oil, olive oil, canola oil, safflower oil, soybean oil, and sunflower oil. Peanut oil is not as good, but small amounts are acceptable. Buy a heart-healthy tub margarine that has no partially hydrogenated oils in the ingredients. Mayonnaise and salad dressings often are made from unsaturated fats, but they should also be limited because of their high calorie and fat content. Seeds, nuts, peanut butter, olives, and avocados are high in fat, but the fat is mainly the unsaturated type. These foods should be limited mainly to avoid excess calories and fat. OTHER EATING TIPS Snacks  Most sweets should be limited as snacks. They tend to be rich in calories and fats, and their caloric content outweighs their nutritional value. Some good choices in snacks are graham crackers, melba toast, soda crackers, bagels (no egg), English muffins, fruits, and vegetables. These snacks are preferable to snack crackers, [place] fries, and chips. Popcorn should be air-popped or cooked in small amounts of liquid vegetable oil. Desserts Eat fruit, low-fat yogurt, and fruit ices. AVOID pastries, cake, and cookies. Sherbet, angel food cake, gelatin dessert, frozen low-fat yogurt, or other frozen products that do not contain saturated fat (pure fruit juice bars, frozen ice pops) are also acceptable.  COOKING METHODS Choose those methods that use little or no fat. They include: Poaching.  Braising.  Steaming.  Grilling.  Baking.  Stir-frying.  Broiling.  Microwaving.  Foods can be cooked in a nonstick pan without added fat, or use a nonfat cooking spray in regular cookware. Limit fried foods and avoid frying in saturated fat. Add  moisture to lean meats by using water, broth, cooking wines, and other nonfat or low-fat sauces along with the cooking methods mentioned above. Soups and stews should be chilled after cooking. The fat that forms on top after a few hours in the refrigerator should be skimmed off. When preparing meals, avoid using excess salt. Salt can contribute to raising blood pressure in some people. EATING AWAY FROM HOME Order entres, potatoes, and vegetables without sauces or butter. When meat exceeds the size of a deck of cards (3 to 4 ounces), the rest can be taken home for another meal. Choose vegetable or fruit salads and ask for low-calorie salad dressings to be served on the side. Use dressings sparingly. Limit high-fat toppings, such as bacon, crumbled eggs, cheese, sunflower seeds, and olives. Ask for heart-healthy tub margarine instead of butter.   Diverticulosis Diverticulosis is a common condition that develops when small pouches (diverticula) form in the wall of the colon. The risk of diverticulosis increases with age. It happens more often in people who eat a low-fiber diet. Most individuals with diverticulosis have no symptoms. Those individuals with symptoms usually experience belly (abdominal) pain, constipation, or loose stools (diarrhea).  HOME CARE INSTRUCTIONS  Increase the amount of fiber in your diet as directed by your caregiver or dietician. This may reduce symptoms of diverticulosis.   Drink at least 6 to 8 glasses of water each day to prevent constipation.   Try not to strain when you have a bowel movement.   Avoiding nuts and seeds to prevent complications is still an uncertain benefit.       FOODS HAVING HIGH FIBER CONTENT INCLUDE:  Fruits. Apple, peach, pear, tangerine, raisins, prunes.   Vegetables. Brussels sprouts, asparagus, broccoli, cabbage, carrot,  cauliflower, romaine lettuce, spinach, summer squash, tomato, winter squash, zucchini.   Starchy Vegetables. Baked  beans, kidney beans, lima beans, split peas, lentils, potatoes (with skin).   Grains. Whole wheat bread, brown rice, bran flake cereal, plain oatmeal, white rice, shredded wheat, bran muffins.    SEEK IMMEDIATE MEDICAL CARE IF:  You develop increasing pain or severe bloating.   You have an oral temperature above 101F.   You develop vomiting or bowel movements that are bloody or black.    Hemorrhoids Hemorrhoids are dilated (enlarged) veins around the rectum. Sometimes clots will form in the veins. This makes them swollen and painful. These are called thrombosed hemorrhoids. Causes of hemorrhoids include:  Constipation.   Straining to have a bowel movement.   HEAVY LIFTING HOME CARE INSTRUCTIONS  Eat a well balanced diet and drink 6 to 8 glasses of water every day to avoid constipation. You may also use a bulk laxative.   Avoid straining to have bowel movements.   Keep anal area dry and clean.   Do not use a donut shaped pillow or sit on the toilet for long periods. This increases blood pooling and pain.   Move your bowels when your body has the urge; this will require less straining and will decrease pain and pressure.

## 2012-01-29 NOTE — OR Nursing (Signed)
Dr. Oneida Alar notified of increased BP. Orders received and carried out.

## 2012-01-29 NOTE — Op Note (Signed)
[facility] [address] [place], [place]  [address]  DR. FIELD'S TCS EGD PROCEDURE REPORT  PATIENT:  Joanna [name], Joanna [name]  MR#:  [id number] BIRTHDATE:   25 yrs. old  GENDER:  female  ENDOSCOPIST:  Barney Drain, MD REF. BY:  [name], M.D. ASSISTANT:  PROCEDURE DATE:  01/29/2012 PROCEDURE:  EGD with biopsy, Colonoscopy with biopsy  INDICATIONS:  SCREENING & DYSPEPSIA. REFLUX CONTROLLED IF SHE EATS THE RIGHT THING, BMI 33.  MEDICATIONS:   Demerol 75 mg IV, promethazine (Phenergan) 12.5 mg IV, Versed 5 mg IV  DESCRIPTION OF PROCEDURE:    Physical exam was performed. Informed consent was obtained from the patient after explaining the benefits, risks, and alternatives to procedure.  The patient was connected to monitor and placed in left lateral position. Continuous oxygen was provided by nasal cannula and IV medicine administered through an indwelling cannula.  After administration of sedation, the patient's esophagus was intubated and the [id number]) and [id number]) endoscope was advanced under direct visualization to the second portion of the duodenum. The scope was removed slowly by carefully examining the color, texture, anatomy, and integrity of the mucosa on the way out.  After administration of sedation and rectal exam, the patient's rectum was intubated and the [id number]) and [id number]) colonoscope was advanced under direct visualization to the cecum.  The scope was removed slowly by carefully examining the color, texture, anatomy, and integrity mucosa on the way out. The patient was recovered in endoscopy and discharged home in satisfactory condition. <<PROCEDUREIMAGES>>  FINDINGS:  There were THREE (2-39M) polyps identified and removed. in the rectum VIA COLD FORCEPS.  Diverticula were found throughout the colon.  MODERATE Internal Hemorrhoids were found. NO BARRETT'S. 2-3 CM SLIDING HIATAL HERNIA. DIFFUSE ERYTHEM/EDEMAIN THE  STOMACH WITH RARE EROSIONS IN THE ANTRU BIOPSIED VIA COLD FORCEPS. NL DUODENUM.  PREP QUALITY: GOOD CECAL W/D TIME:    18 minutes  COMPLICATIONS:    None  ENDOSCOPIC IMPRESSION: 1) Polyps, multiple in the rectum-HYPERPLASTIC APPEARING 2) PAN-COLONIC DiverticulOSIS, MODERATE 3) Internal hemorrhoids 4) SMALL SLIDING HIATAL HERNIA 5) MODERATE GASTRITIS  RECOMMENDATIONS: OMEPRAZOLE DAILY-30 MINUTES PRIOR TO MEALS ADHERE TO A LOW FAT/HIGH FIBER DIET LOSE 10-20 LBS AWAIT BIOPSIES OPV IN 4 MOS TCS IN 10 YEARS  REPEAT EXAM:  No  ______________________________ Barney Drain, MD  CC:  [name], M.D.  n. eSIGNED:   Lidiya Reise at 01/29/2012 12:15 PM  Awanda Mink, [id number]

## 2012-01-29 NOTE — H&P (Signed)
Primary Care Physician:  [name], MD, MD Primary Gastroenterologist:  Dr. Oneida Alar  Pre-Procedure History & Physical: HPI:  Joanna [name] is a 61 y.o. female here for dyspepsia & screening.  Past Medical History  Diagnosis Date  . GERD (gastroesophageal reflux disease)   . Diabetes mellitus   . Hypertension   . Depression   . Insomnia     Past Surgical History  Procedure Date  . Total abdominal hysterectomy w/ bilateral salpingoophorectomy   . Knee arthroscopy     Prior to Admission medications   Medication Sig Start Date End Date Taking? Authorizing Provider  aspirin 81 MG tablet Take 81 mg by mouth daily.   Yes Historical Provider, MD  buPROPion (WELLBUTRIN SR) 150 MG 12 hr tablet Take 150 mg by mouth 2 (two) times daily. for tobacco cessation 09/23/11  Yes Historical Provider, MD  levothyroxine (SYNTHROID, LEVOTHROID) 100 MCG tablet Take 100 mcg by mouth daily.  10/12/11  Yes Historical Provider, MD  losartan-hydrochlorothiazide (HYZAAR) 100-12.5 MG per tablet Take 1 tablet by mouth daily.   Yes Historical Provider, MD  metFORMIN (GLUCOPHAGE) 500 MG tablet Take 500 mg by mouth 2 (two) times daily with a meal.  09/23/11  Yes Historical Provider, MD  NIFEdipine (PROCARDIA XL/ADALAT-CC) 30 MG 24 hr tablet Take 30 mg by mouth daily.   Yes Historical Provider, MD  omeprazole (PRILOSEC) 40 MG capsule Take 40 mg by mouth daily.  10/17/11  Yes Historical Provider, MD  pravastatin (PRAVACHOL) 80 MG tablet Take 40 mg by mouth daily. 1/2 tablet 11/22/11  Yes Historical Provider, MD  Sod Picosulfate-Mag Ox-Cit Acd ([place]) 10-3.5-12 MG-GM-GM PACK Take 1 kit by mouth once. 12/27/11  Yes [name], MD  HYDROcodone-acetaminophen (NORCO) 10-325 MG per tablet Take 1 tablet by mouth every 6 (six) hours as needed.  10/06/11   Historical Provider, MD  indapamide (LOZOL) 2.5 MG tablet Take 2.5 mg by mouth every morning.    Historical Provider, MD  ketorolac (TORADOL) 10 MG tablet Take 10 mg by  mouth Once daily as needed. 10/11/11   Historical Provider, MD  losartan (COZAAR) 100 MG tablet Take 100 mg by mouth daily.  10/03/11   Historical Provider, MD  triamcinolone cream (KENALOG) 0.1 %  12/22/11   Historical Provider, MD  zolpidem (AMBIEN) 5 MG tablet Take 5 mg by mouth at bedtime as needed. 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 (No Known Allergies)    Family History  Problem Relation Age of Onset  . Cardiomyopathy Father   . Heart failure Mother   . Stroke Brother   . Kidney cancer Brother   NO COLON CA OR POLYPS   History   Social History  . Marital Status: Divorced    Spouse Name: N/A    Number of Children: 2  . 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 Smoking status: Former Smoker -- 0.5 packs/day for 30 years    Types: Cigarettes    Quit date: 10/16/2010  . Smokeless tobacco: Former Systems developer    Quit date: 10/28/2010  . Alcohol Use: Yes     once per mo, socially  . Drug Use: No  . Sexually Active: Not on file   Other Topics Concern  . Not on file   Social History Narrative  . No narrative on file  Review of Systems: See HPI, otherwise negative ROS   Physical Exam: BP 151/103  Resp 18  Ht 5\' 2"  (1.575 m)  Wt 185 lb (83.915 kg)  BMI 33.84 kg/m2  SpO2 96% General:   Alert,  pleasant and cooperative in NAD Head:  Normocephalic and atraumatic. Neck:  Supple; Lungs:  Clear throughout to auscultation.    Heart:  Regular rate and rhythm. Abdomen:  Soft, nontender and nondistended. Normal bowel sounds, without guarding, and without rebound.   Neurologic:  Alert and  oriented x4;  grossly normal neurologically.  Impression/Plan:    dyspepsia AVERAGE RISK  PLAN: /egd/TCS TODAY

## 2012-02-07 ENCOUNTER — Telehealth: Payer: Self-pay | Admitting: Gastroenterology

## 2012-02-07 NOTE — Telephone Encounter (Signed)
Called and informed pt.  

## 2012-02-07 NOTE — Telephone Encounter (Addendum)
Please call pt. She had HYPERPLASTIC POLYPS removed from her colon. HER STOMACH BIOPSIES SHOW BENIGN STOMACH POLYPS.   CONTINUE OMEPRAZOLE. AVOID ITEMS THAT TRIGGER GASTRITIS. SEE INFO BELOW. FOLLOW YOUR HIGH FIBER/LOW FAT DIET. AVOID ITEMS THAT CAUSE BLOATING.  LOSE 10 TO 20 LBS. IT WILL HELP YOUR REFLUX BE BETTER CONTROLLED AND DECREASE YOUR RISK FOR COLON CANCER. FOLLOW UP IN 4 MOS. COLONOSCOPY IN 10 YEARS.

## 2012-02-07 NOTE — Telephone Encounter (Signed)
Results Cc to PCP  

## 2012-02-22 NOTE — Telephone Encounter (Signed)
Pt is aware of OV on 8/15 at 0930 with KJ and reminder in epic to have tcs in 10 years

## 2012-05-02 ENCOUNTER — Other Ambulatory Visit (HOSPITAL_COMMUNITY): Payer: Self-pay | Admitting: Physical Medicine and Rehabilitation

## 2012-05-02 DIAGNOSIS — M5137 Other intervertebral disc degeneration, lumbosacral region: Secondary | ICD-10-CM

## 2012-05-06 ENCOUNTER — Ambulatory Visit (HOSPITAL_COMMUNITY)
Admission: RE | Admit: 2012-05-06 | Discharge: 2012-05-06 | Disposition: A | Discharge status: Home / Self Care | Payer: BC Managed Care – PPO | Source: Ambulatory Visit | Attending: Physical Medicine and Rehabilitation | Admitting: Physical Medicine and Rehabilitation

## 2012-05-06 DIAGNOSIS — M5137 Other intervertebral disc degeneration, lumbosacral region: Secondary | ICD-10-CM

## 2012-05-07 ENCOUNTER — Ambulatory Visit (HOSPITAL_COMMUNITY)
Admission: RE | Admit: 2012-05-07 | Discharge: 2012-05-07 | Disposition: A | Discharge status: Home / Self Care | Payer: BC Managed Care – PPO | Source: Ambulatory Visit | Attending: Physical Medicine and Rehabilitation | Admitting: Physical Medicine and Rehabilitation

## 2012-05-07 DIAGNOSIS — M545 Low back pain, unspecified: Secondary | ICD-10-CM | POA: Insufficient documentation

## 2012-05-07 DIAGNOSIS — M79609 Pain in unspecified limb: Secondary | ICD-10-CM | POA: Insufficient documentation

## 2012-05-07 DIAGNOSIS — M5126 Other intervertebral disc displacement, lumbar region: Secondary | ICD-10-CM | POA: Insufficient documentation

## 2012-05-29 ENCOUNTER — Encounter: Payer: Self-pay | Admitting: Gastroenterology

## 2012-05-30 ENCOUNTER — Encounter: Payer: Self-pay | Admitting: Urgent Care

## 2012-05-30 ENCOUNTER — Ambulatory Visit (INDEPENDENT_AMBULATORY_CARE_PROVIDER_SITE_OTHER): Payer: BC Managed Care – PPO | Admitting: Urgent Care

## 2012-05-30 VITALS — BP 117/89 | HR 90 | Temp 97.8°F | Ht 62.0 in | Wt 182.4 lb

## 2012-05-30 DIAGNOSIS — K219 Gastro-esophageal reflux disease without esophagitis: Secondary | ICD-10-CM

## 2012-05-30 MED ORDER — PANTOPRAZOLE SODIUM 40 MG PO TBEC: 40.0000 mg | DELAYED_RELEASE_TABLET | Freq: Two times a day (BID) | ORAL | Status: DC

## 2012-05-30 NOTE — Progress Notes (Signed)
Faxed to PCP

## 2012-05-30 NOTE — Progress Notes (Signed)
Referring Provider: [name], MD Primary Care Physician:  [name], MD Primary Gastroenterologist:  Dr. Barney Drain  Chief Complaint  Patient presents with  . Follow-up    HPI:  Joanna [name] is a 61 y.o. female here for follow up for GERD.  Recent EGD & screening colonoscopy benign as below.  Pt was supposed to increase omeprazole to 20mg  BID, but doesn't remember being told that so it was never done. She does have occasional heartburn or water brash, but otherwise feels fine. She is having some chronic back pain.  Previously taking BC powders since back pain x 1 yr, worse lately followed by Dr Ace Gins.  Last injection 2 days ago and she is feeling a bit better.    Past Medical History  Diagnosis Date  . GERD (gastroesophageal reflux disease)   . Diabetes mellitus   . Hypertension   . Depression   . Insomnia   . Diverticulosis     Past Surgical History  Procedure Date  . Total abdominal hysterectomy w/ bilateral salpingoophorectomy   . Knee arthroscopy   . Esophagogastroduodenoscopy  01/29/2012    Fields-2-3cm sliding HH, fundic gland polyps, NEGATIVE H pylori  . Colonoscopy 01/29/12    Fields-2-3 hyperplastic polyps, mod internal hemorrhoids, diverticulosis throughout colon    Current Outpatient Prescriptions  Medication Sig Dispense Refill  . aspirin 81 MG tablet Take 81 mg by mouth daily.      Marland Kitchen buPROPion (WELLBUTRIN SR) 150 MG 12 hr tablet Take 150 mg by mouth 2 (two) times daily. for tobacco cessation      . desipramine (NORPRAMIN) 25 MG tablet Take 25 mg by mouth 2 (two) times daily.       Marland Kitchen levothyroxine (SYNTHROID, LEVOTHROID) 100 MCG tablet Take 100 mcg by mouth daily.       Marland Kitchen losartan-hydrochlorothiazide (HYZAAR) 100-12.5 MG per tablet Take 1 tablet by mouth daily.      Marland Kitchen NIFEdipine (PROCARDIA XL/ADALAT-CC) 30 MG 24 hr tablet Take 30 mg by mouth daily.      Marland Kitchen omeprazole (PRILOSEC) 40 MG capsule Take 40 mg by mouth daily.       . pravastatin (PRAVACHOL) 80  MG tablet Take 40 mg by mouth daily. 1/2 tablet      . HYDROcodone-acetaminophen (NORCO) 10-325 MG per tablet Take 1 tablet by mouth every 6 (six) hours as needed.       . indapamide (LOZOL) 2.5 MG tablet Take 2.5 mg by mouth every morning.      Marland Kitchen ketorolac (TORADOL) 10 MG tablet Take 10 mg by mouth Once daily as needed.      Marland Kitchen losartan (COZAAR) 100 MG tablet Take 100 mg by mouth daily.       . metFORMIN (GLUCOPHAGE) 500 MG tablet Take 500 mg by mouth 2 (two) times daily with a meal.       . triamcinolone cream (KENALOG) 0.1 % Apply 1 application topically 2 (two) times daily.       Marland Kitchen zolpidem (AMBIEN) 5 MG tablet Take 5 mg by mouth at bedtime as needed. 2 tablets at bedtime-only when she works nights         Allergies as of 05/30/2012  . (No Known Allergies)    Review of Systems: See history of present illness otherwise negative review of systems  Physical Exam: BP 117/89  Pulse 113  Temp 97.8 F (36.6 C) (Temporal)  Ht 5\' 2"  (1.575 m)  Wt 182 lb 6.4 oz (82.736 kg)  BMI  33.36 kg/m2 General:   Alert,  Well-developed, well-nourished, pleasant and cooperative in NAD Eyes:  Sclera clear, no icterus.   Conjunctiva pink. Mouth:  No deformity or lesions, oropharynx pink and moist. Neck:  Supple; no masses or thyromegaly. Heart:  Regular rate and rhythm; no murmurs, clicks, rubs,  or gallops. Abdomen:  Normal bowel sounds.  No bruits.  Soft, non-tender and non-distended without masses, hepatosplenomegaly or hernias noted.  No guarding or rebound tenderness.   Rectal:  Deferred. Msk:  Symmetrical without gross deformities.  Pulses:  Normal pulses noted. Extremities:  No clubbing or edema. Neurologic:  Alert and oriented x4;  grossly normal neurologically. Skin:  Intact without significant lesions or rashes.

## 2012-05-30 NOTE — Patient Instructions (Addendum)
Stop omeprazole  Try protonix 40mg  before breakfast & supper for 3 months. If you aren't doing better at that time, would suggest decreasing to once daily.  Please call with a progress report for a new prescription. Office visit in one year with Dr. Oneida Alar Next colonoscopy April 2023 unless problems

## 2012-05-30 NOTE — Assessment & Plan Note (Addendum)
Breakthrough on once daily omeprazole. Trial Protonix 40 mg before breakfast and dinner for 3 months.  If she is doing better at that time, would suggest decreasing to Protonix 40 mg daily.  Please call with a progress report for a new prescription. Stop omeprazole   Office visit in one year with Dr. Oneida Alar Next colonoscopy April 2023 unless problems

## 2012-06-20 ENCOUNTER — Other Ambulatory Visit: Payer: Self-pay | Admitting: Family Medicine

## 2012-06-20 DIAGNOSIS — N289 Disorder of kidney and ureter, unspecified: Secondary | ICD-10-CM

## 2012-06-21 ENCOUNTER — Ambulatory Visit (HOSPITAL_COMMUNITY)
Admission: RE | Admit: 2012-06-21 | Discharge: 2012-06-21 | Disposition: A | Discharge status: Home / Self Care | Payer: BC Managed Care – PPO | Source: Ambulatory Visit | Attending: Family Medicine | Admitting: Family Medicine

## 2012-06-21 DIAGNOSIS — N289 Disorder of kidney and ureter, unspecified: Secondary | ICD-10-CM

## 2012-12-11 ENCOUNTER — Emergency Department (HOSPITAL_COMMUNITY): Payer: BC Managed Care – PPO

## 2012-12-11 ENCOUNTER — Emergency Department (HOSPITAL_COMMUNITY)
Admission: EM | Admit: 2012-12-11 | Discharge: 2012-12-12 | Disposition: A | Discharge status: Home / Self Care | Payer: BC Managed Care – PPO | Attending: Emergency Medicine | Admitting: Emergency Medicine

## 2012-12-11 ENCOUNTER — Encounter (HOSPITAL_COMMUNITY): Payer: Self-pay | Admitting: Emergency Medicine

## 2012-12-11 DIAGNOSIS — K219 Gastro-esophageal reflux disease without esophagitis: Secondary | ICD-10-CM | POA: Insufficient documentation

## 2012-12-11 DIAGNOSIS — M65979 Unspecified synovitis and tenosynovitis, unspecified ankle and foot: Secondary | ICD-10-CM | POA: Insufficient documentation

## 2012-12-11 DIAGNOSIS — F329 Major depressive disorder, single episode, unspecified: Secondary | ICD-10-CM | POA: Insufficient documentation

## 2012-12-11 DIAGNOSIS — Z7982 Long term (current) use of aspirin: Secondary | ICD-10-CM | POA: Insufficient documentation

## 2012-12-11 DIAGNOSIS — I10 Essential (primary) hypertension: Secondary | ICD-10-CM | POA: Insufficient documentation

## 2012-12-11 DIAGNOSIS — M659 Synovitis and tenosynovitis, unspecified: Secondary | ICD-10-CM | POA: Insufficient documentation

## 2012-12-11 DIAGNOSIS — F3289 Other specified depressive episodes: Secondary | ICD-10-CM | POA: Insufficient documentation

## 2012-12-11 DIAGNOSIS — E119 Type 2 diabetes mellitus without complications: Secondary | ICD-10-CM | POA: Insufficient documentation

## 2012-12-11 DIAGNOSIS — M7750 Other enthesopathy of unspecified foot: Secondary | ICD-10-CM

## 2012-12-11 DIAGNOSIS — Z87891 Personal history of nicotine dependence: Secondary | ICD-10-CM | POA: Insufficient documentation

## 2012-12-11 DIAGNOSIS — Z8719 Personal history of other diseases of the digestive system: Secondary | ICD-10-CM | POA: Insufficient documentation

## 2012-12-11 DIAGNOSIS — Z79899 Other long term (current) drug therapy: Secondary | ICD-10-CM | POA: Insufficient documentation

## 2012-12-11 NOTE — ED Provider Notes (Signed)
History     CSN: [id number]  Arrival date & time 12/11/12  2222   First MD Initiated Contact with Patient 12/11/12 2346      Chief Complaint  Patient presents with  . Foot Pain    (Consider location/radiation/quality/duration/timing/severity/associated sxs/prior treatment) HPI Joanna [name] is a 62 y.o. female who presents to the ED with foot pain. The pain is located in the right foot. The pain starts at the little toe and goes up the side of the foot.  The pain started 2 weeks ago. She is unsure of any injury. The onset was gradual and has gotten worse. She denies any other problems. The history was provided by the patient.  Past Medical History  Diagnosis Date  . GERD (gastroesophageal reflux disease)   . Diabetes mellitus   . Hypertension   . Depression   . Insomnia   . Diverticulosis     Past Surgical History  Procedure Laterality Date  . Total abdominal hysterectomy w/ bilateral salpingoophorectomy    . Knee arthroscopy    . Esophagogastroduodenoscopy   01/29/2012    Fields-2-3cm sliding HH, fundic gland polyps, NEGATIVE H pylori  . Colonoscopy  01/29/12    Fields-2-3 hyperplastic polyps, mod internal hemorrhoids, diverticulosis throughout colon  . Back surgery      Family History  Problem Relation Age of Onset  . Cardiomyopathy Father   . Heart failure Mother   . Stroke Brother   . Kidney cancer Brother     History  Substance Use Topics  . Smoking status: Former Smoker -- 0.50 packs/day for 30 years    Types: Cigarettes    Quit date: 10/16/2010  . Smokeless tobacco: Former Systems developer    Quit date: 10/28/2010  . Alcohol Use: Yes     Comment: once per mo, socially    OB History   Grav Para Term Preterm Abortions TAB SAB Ect Mult Living                  Review of Systems  Constitutional: Negative for fever, chills, diaphoresis and fatigue.  HENT: Negative for ear pain, congestion, sore throat, facial swelling, neck pain, neck stiffness, dental problem and  sinus pressure.   Eyes: Negative for photophobia, pain and discharge.  Respiratory: Negative for cough, chest tightness and wheezing.   Cardiovascular: Negative for chest pain and palpitations.  Gastrointestinal: Negative for nausea, vomiting, abdominal pain, diarrhea, constipation and abdominal distention.  Genitourinary: Negative for dysuria, urgency, frequency, flank pain and difficulty urinating.  Musculoskeletal: Positive for back pain (since surgery 5 weeks ago for disc problem). Negative for myalgias.       Left foot pain  Skin: Negative for color change and rash.  Allergic/Immunologic: Negative for immunocompromised state.  Neurological: Negative for dizziness, speech difficulty, weakness, light-headedness, numbness and headaches.  Psychiatric/Behavioral: Negative for confusion and agitation. Nervous/anxious: dx with depression.     Allergies  Review of patient's allergies indicates no known allergies.  Home Medications   Current Outpatient Rx  Name  Route  Sig  Dispense  Refill  . aspirin 81 MG tablet   Oral   Take 81 mg by mouth daily.         Marland Kitchen buPROPion (WELLBUTRIN SR) 150 MG 12 hr tablet   Oral   Take 150 mg by mouth 2 (two) times daily. for tobacco cessation         . desipramine (NORPRAMIN) 25 MG tablet   Oral   Take 25 mg by  mouth 2 (two) times daily.          . fluticasone (FLONASE) 50 MCG/ACT nasal spray   Nasal   Place 2 sprays into the nose as needed.         Marland Kitchen levothyroxine (SYNTHROID, LEVOTHROID) 100 MCG tablet   Oral   Take 100 mcg by mouth daily.          Marland Kitchen losartan-hydrochlorothiazide (HYZAAR) 100-12.5 MG per tablet   Oral   Take 1 tablet by mouth daily.         Marland Kitchen NIFEdipine (PROCARDIA XL/ADALAT-CC) 30 MG 24 hr tablet   Oral   Take 30 mg by mouth daily.         Marland Kitchen omeprazole (PRILOSEC) 40 MG capsule   Oral   Take 40 mg by mouth daily.          . pantoprazole (PROTONIX) 40 MG tablet   Oral   Take 1 tablet (40 mg total) by  mouth 2 (two) times daily before a meal.   180 tablet   3   . pravastatin (PRAVACHOL) 80 MG tablet   Oral   Take 40 mg by mouth daily. 1/2 tablet           BP 124/93  Pulse 121  Temp(Src) 98.1 F (36.7 C) (Oral)  Resp 18  Ht 5\' 2"  (1.575 m)  Wt 180 lb (81.647 kg)  BMI 32.91 kg/m2  SpO2 100%  Physical Exam  Nursing note and vitals reviewed. Constitutional: She is oriented to person, place, and time. She appears well-developed and well-nourished. No distress.  HENT:  Head: Normocephalic and atraumatic.  Eyes: EOM are normal.  Neck: Neck supple.  Cardiovascular:  tachycardia  Pulmonary/Chest: Effort normal. No respiratory distress.  Musculoskeletal:       Right foot: She exhibits tenderness. She exhibits normal range of motion, normal capillary refill and no deformity.       Feet:  Pedal pulses strong and equal bilateral. Adequate circulation. Good touch sensation. Tenderness lateral aspect of the right foot with palpation.   Neurological: She is alert and oriented to person, place, and time. No cranial nerve deficit.  Skin: Skin is warm and dry.  Psychiatric: She has a normal mood and affect. Her behavior is normal. Judgment and thought content normal.    ED Course  Procedures Dg Foot Complete Right  12/12/2012  *RADIOLOGY REPORT*  Clinical Data: Right foot pain.  RIGHT FOOT COMPLETE - 3+ VIEW  Comparison: None  Findings: There is no evidence of acute fracture, subluxation, or dislocation. The Lisfranc joints are intact. No focal bony lesions are identified. There is no evidence of radiopaque foreign body.  The joint spaces are unremarkable.  IMPRESSION: Unremarkable right foot.   Original Report Authenticated By: Margarette Canada, M.D.     Assessment: 62 y.o. female with tendonitis right foot  Plan:  Patient has oxycodone that she is taking for her recent back surgery  Discussed with the patient and all questioned fully answered. She will follow up with Dr. Wolfgang Phoenix or  return here if any problems arise.    Medication List    TAKE these medications       ibuprofen 600 MG tablet  Commonly known as:  ADVIL,MOTRIN  Take 1 tablet (600 mg total) by mouth every 6 (six) hours as needed for pain.      ASK your doctor about these medications       aspirin 81 MG tablet  Take 81 mg by  mouth daily.     buPROPion 150 MG 12 hr tablet  Commonly known as:  WELLBUTRIN SR  Take 150 mg by mouth 2 (two) times daily. for tobacco cessation     desipramine 25 MG tablet  Commonly known as:  NORPRAMIN  Take 25 mg by mouth 2 (two) times daily.     fluticasone 50 MCG/ACT nasal spray  Commonly known as:  FLONASE  Place 2 sprays into the nose as needed.     levothyroxine 100 MCG tablet  Commonly known as:  SYNTHROID, LEVOTHROID  Take 100 mcg by mouth daily.     losartan-hydrochlorothiazide 100-12.5 MG per tablet  Commonly known as:  HYZAAR  Take 1 tablet by mouth daily.     NIFEdipine 30 MG 24 hr tablet  Commonly known as:  PROCARDIA-XL/ADALAT-CC/NIFEDICAL-XL  Take 30 mg by mouth daily.     omeprazole 40 MG capsule  Commonly known as:  PRILOSEC  Take 40 mg by mouth daily.     pantoprazole 40 MG tablet  Commonly known as:  PROTONIX  Take 1 tablet (40 mg total) by mouth 2 (two) times daily before a meal.     pravastatin 80 MG tablet  Commonly known as:  PRAVACHOL  Take 40 mg by mouth daily. 1/2 tablet             [place], [place] 12/12/12 [phone]

## 2012-12-11 NOTE — ED Notes (Signed)
Patient complaining of right foot pain and swelling. Reports started two weeks ago and has gotten worse.

## 2012-12-12 MED ORDER — IBUPROFEN 600 MG PO TABS: 600.0000 mg | ORAL_TABLET | Freq: Four times a day (QID) | ORAL | Status: DC | PRN

## 2012-12-12 NOTE — ED Provider Notes (Signed)
Medical screening examination/treatment/procedure(s) were performed by non-physician practitioner and as supervising physician I was immediately available for consultation/collaboration.   Imogene Gravelle M Orman Matsumura, MD 12/12/12 0351 

## 2012-12-16 ENCOUNTER — Other Ambulatory Visit (HOSPITAL_COMMUNITY): Payer: Self-pay | Admitting: Family Medicine

## 2012-12-16 DIAGNOSIS — M79671 Pain in right foot: Secondary | ICD-10-CM

## 2012-12-23 ENCOUNTER — Encounter (HOSPITAL_COMMUNITY)
Admission: RE | Admit: 2012-12-23 | Discharge: 2012-12-23 | Disposition: A | Discharge status: Home / Self Care | Payer: BC Managed Care – PPO | Source: Ambulatory Visit | Attending: Family Medicine | Admitting: Family Medicine

## 2012-12-23 ENCOUNTER — Encounter (HOSPITAL_COMMUNITY): Payer: Self-pay

## 2012-12-23 DIAGNOSIS — M25579 Pain in unspecified ankle and joints of unspecified foot: Secondary | ICD-10-CM | POA: Insufficient documentation

## 2012-12-23 DIAGNOSIS — M79671 Pain in right foot: Secondary | ICD-10-CM

## 2012-12-23 DIAGNOSIS — R937 Abnormal findings on diagnostic imaging of other parts of musculoskeletal system: Secondary | ICD-10-CM | POA: Insufficient documentation

## 2012-12-23 MED ORDER — TECHNETIUM TC 99M MEDRONATE IV KIT
25.0000 | PACK | Freq: Once | INTRAVENOUS | Status: AC | PRN
  Administered 2012-12-23: 24 via INTRAVENOUS

## 2013-01-16 ENCOUNTER — Ambulatory Visit (HOSPITAL_COMMUNITY)
Admission: RE | Admit: 2013-01-16 | Discharge: 2013-01-16 | Disposition: A | Discharge status: Home / Self Care | Payer: BC Managed Care – PPO | Source: Ambulatory Visit | Attending: Family Medicine | Admitting: Family Medicine

## 2013-01-16 ENCOUNTER — Ambulatory Visit (INDEPENDENT_AMBULATORY_CARE_PROVIDER_SITE_OTHER): Payer: BC Managed Care – PPO | Admitting: Family Medicine

## 2013-01-16 ENCOUNTER — Encounter: Payer: Self-pay | Admitting: Family Medicine

## 2013-01-16 VITALS — BP 126/88 | HR 80 | Ht 61.75 in | Wt 181.0 lb

## 2013-01-16 DIAGNOSIS — E785 Hyperlipidemia, unspecified: Secondary | ICD-10-CM | POA: Insufficient documentation

## 2013-01-16 DIAGNOSIS — M2550 Pain in unspecified joint: Secondary | ICD-10-CM

## 2013-01-16 DIAGNOSIS — M25559 Pain in unspecified hip: Secondary | ICD-10-CM | POA: Insufficient documentation

## 2013-01-16 DIAGNOSIS — E1122 Type 2 diabetes mellitus with diabetic chronic kidney disease: Secondary | ICD-10-CM | POA: Insufficient documentation

## 2013-01-16 DIAGNOSIS — J019 Acute sinusitis, unspecified: Secondary | ICD-10-CM

## 2013-01-16 DIAGNOSIS — E119 Type 2 diabetes mellitus without complications: Secondary | ICD-10-CM | POA: Insufficient documentation

## 2013-01-16 LAB — POCT GLYCOSYLATED HEMOGLOBIN (HGB A1C): Hemoglobin A1C: 7.8

## 2013-01-16 MED ORDER — LEVOFLOXACIN 500 MG PO TABS: 500.0000 mg | ORAL_TABLET | Freq: Every day | ORAL | Status: DC

## 2013-01-16 MED ORDER — GLIPIZIDE 5 MG PO TABS: 5.0000 mg | ORAL_TABLET | Freq: Two times a day (BID) | ORAL | Status: DC

## 2013-01-16 NOTE — Progress Notes (Signed)
  Subjective:    Patient ID: Joanna [name], female    DOB: [birth year], 62 y.o.   MRN: [id number]  Sinusitis This is a new problem. The current episode started 1 to 4 weeks ago. The problem has been gradually worsening since onset. There has been no fever. Her pain is at a severity of 8/10. The pain is moderate. Associated symptoms include congestion, headaches and sinus pressure. Treatments tried: allegra. The treatment provided no relief.   Patient relates head congestion sinus pressure drainage coughing not feeling good symptoms over this past several weeks. She also relates multiple other issues She is having back pain discomfort radiates into both hips worse on the right side than left side hurts with certain movements twisting bending sitting. She has been seen by the neurosurgeon who told her that she may have some hip arthritis has never had x-rays of that area She also relates chronic pain in both legs she takes pain medication and Neurontin a try to help this out she needs it states very difficult to move around and twists lift. She is currently on leave from work. She also has a stress fracture in the right foot she is wearing a postop shoe on a regular basis to try to help with this is been doing so for the past several weeks  Patient also has diabetes she's trying to watch the sugars in her diet as best she can she take the medicine as directed she also has renal insufficiency she cannot take metformin. She is taking glipizide 5 mg in the morning and half of that at supper time. Review of Systems  HENT: Positive for congestion and sinus pressure.   Neurological: Positive for headaches.       Objective:   Physical Exam Eardrums normal throat is normal neck is supple moderate sinus tenderness Lungs are clear no crackle heart is regular Abdomen is soft Hips fair R. With him subjective discomfort more so on the right side than the left side Right foot some tenderness in the fifth  metatarsal where the stress fracture is.       Assessment & Plan:  Arthralgia - Plan: DG Hip Complete Right  Type II or unspecified type diabetes mellitus without mention of complication, not stated as uncontrolled - Plan: POCT glycosylated hemoglobin (Hb A1C), glipiZIDE (GLUCOTROL) 5 MG tablet  Acute sinusitis - Plan: levofloxacin (LEVAQUIN) 500 MG tablet  Watch diet closely keep carbs down in addition to this continue to wear the boot for the stress fracture or for the next 6 weeks followup in 6 weeks. Followup with back specialist as well x-rays of the hip recommended. Glipizide 5 mg in the morning 5 mg at suppertime notify us if any problems with her sugar A1c looks better than what it was but it still could see some improvement. Currently 7.8. 25-30 minutes spent with patient. Recheck within 3 months. [id number].

## 2013-01-16 NOTE — Patient Instructions (Addendum)
#  1 sinus infection-Levaquin 500 milligram 1 daily for the next 10 days this should clear you updated does not please call we may have to do an additional antibiotic.  #2 diabetes-try to minimize starches in the diet. Occasionally check your sugar in the morning. In the morning sugars should run less than 120. If you check it later in the day I would recommend checking a sugar level II hours after eating. The goal would be being less than 150. You will now start taking glipizide 5 mg 1 in the morning and one at supper time. If you start experiencing any low sugar spells please let us know.  #3 hip arthralgia-please do the x-ray at hospital the order is already over there. When we get the results we will notify you of the room of what it findings.  #4 stress fracture of the foot this should gradually get better continue to wear the boot and I will see back in 6 weeks hopefully at that point we can take that off  #5 disability-I. Would support shoe with disability please think about the decision talk with your family if you filed for disability please give me a heads up that you've done so.

## 2013-02-15 ENCOUNTER — Encounter: Payer: Self-pay | Admitting: *Deleted

## 2013-02-15 ENCOUNTER — Other Ambulatory Visit: Payer: Self-pay | Admitting: Family Medicine

## 2013-02-18 ENCOUNTER — Other Ambulatory Visit: Payer: Self-pay | Admitting: Neurosurgery

## 2013-02-18 DIAGNOSIS — IMO0002 Reserved for concepts with insufficient information to code with codable children: Secondary | ICD-10-CM

## 2013-02-18 DIAGNOSIS — M47817 Spondylosis without myelopathy or radiculopathy, lumbosacral region: Secondary | ICD-10-CM

## 2013-02-25 ENCOUNTER — Ambulatory Visit
Admission: RE | Admit: 2013-02-25 | Discharge: 2013-02-25 | Disposition: A | Discharge status: Home / Self Care | Payer: BC Managed Care – PPO | Source: Ambulatory Visit | Attending: Neurosurgery | Admitting: Neurosurgery

## 2013-02-25 DIAGNOSIS — IMO0002 Reserved for concepts with insufficient information to code with codable children: Secondary | ICD-10-CM

## 2013-02-25 DIAGNOSIS — M47817 Spondylosis without myelopathy or radiculopathy, lumbosacral region: Secondary | ICD-10-CM

## 2013-02-25 MED ORDER — GADOBENATE DIMEGLUMINE 529 MG/ML IV SOLN
8.0000 mL | Freq: Once | INTRAVENOUS | Status: AC | PRN
  Administered 2013-02-25: 8 mL via INTRAVENOUS

## 2013-02-27 ENCOUNTER — Encounter: Payer: Self-pay | Admitting: Family Medicine

## 2013-02-27 ENCOUNTER — Ambulatory Visit (INDEPENDENT_AMBULATORY_CARE_PROVIDER_SITE_OTHER): Payer: BC Managed Care – PPO | Admitting: Family Medicine

## 2013-02-27 VITALS — BP 130/82 | Wt 183.4 lb

## 2013-02-27 DIAGNOSIS — IMO0002 Reserved for concepts with insufficient information to code with codable children: Secondary | ICD-10-CM

## 2013-02-27 DIAGNOSIS — M84374D Stress fracture, right foot, subsequent encounter for fracture with routine healing: Secondary | ICD-10-CM

## 2013-02-27 DIAGNOSIS — E119 Type 2 diabetes mellitus without complications: Secondary | ICD-10-CM

## 2013-02-27 DIAGNOSIS — M84374A Stress fracture, right foot, initial encounter for fracture: Secondary | ICD-10-CM | POA: Insufficient documentation

## 2013-02-27 DIAGNOSIS — E785 Hyperlipidemia, unspecified: Secondary | ICD-10-CM

## 2013-02-27 DIAGNOSIS — G8929 Other chronic pain: Secondary | ICD-10-CM | POA: Insufficient documentation

## 2013-02-27 DIAGNOSIS — M84469D Pathological fracture, unspecified tibia and fibula, subsequent encounter for fracture with routine healing: Secondary | ICD-10-CM

## 2013-02-27 DIAGNOSIS — M5416 Radiculopathy, lumbar region: Secondary | ICD-10-CM

## 2013-02-27 MED ORDER — OMEPRAZOLE 40 MG PO CPDR: 40.0000 mg | DELAYED_RELEASE_CAPSULE | Freq: Every day | ORAL | Status: DC

## 2013-02-27 MED ORDER — LEVOTHYROXINE SODIUM 100 MCG PO TABS: 100.0000 ug | ORAL_TABLET | Freq: Every day | ORAL | Status: DC

## 2013-02-27 NOTE — Progress Notes (Signed)
  Subjective:    Patient ID: Joanna [name], female    DOB: 1950-12-12, 62 y.o.   MRN: [id number]  Foot Pain This is a chronic problem. The current episode started more than 1 month ago. The problem occurs constantly. The problem has been unchanged. Associated symptoms include arthralgias (Lumbar) and weakness (Weakness secondary to the pain). Pertinent negatives include no abdominal pain, anorexia, headaches, joint swelling, myalgias or nausea. The symptoms are aggravated by walking. She has tried acetaminophen and immobilization for the symptoms. The treatment provided mild relief.  Back Pain This is a chronic problem. The current episode started more than 1 year ago. The problem occurs constantly. The problem has been gradually worsening since onset. The pain is present in the lumbar spine and sacro-iliac. The quality of the pain is described as aching and burning. The pain radiates to the right thigh. The pain is at a severity of 7/10. The pain is moderate. The pain is the same all the time. The symptoms are aggravated by stress, twisting, standing, position and bending. Stiffness is present all day. Associated symptoms include leg pain, paresis (Occasional tingling in the right buttock) and weakness (Weakness secondary to the pain). Pertinent negatives include no abdominal pain, bladder incontinence, bowel incontinence, dysuria, headaches or weight loss. Risk factors include obesity and lack of exercise. She has tried analgesics, home exercises, ice and walking for the symptoms. The treatment provided no relief.      Review of Systems  Constitutional: Negative for weight loss.  Gastrointestinal: Negative for nausea, abdominal pain, anorexia and bowel incontinence.  Genitourinary: Negative for bladder incontinence and dysuria.  Musculoskeletal: Positive for back pain and arthralgias (Lumbar). Negative for myalgias and joint swelling.  Neurological: Positive for weakness (Weakness secondary to the  pain). Negative for headaches.       Objective:   Physical Exam  Neck no masses lungs are clear no crackles. Heart is regular no murmurs. Pulses normal. Blood pressure noted. Subjective tenderness in the lower back. Positive straight leg raise on the right. Also tenderness at the base of the fifth metatarsal.      Assessment & Plan:  Foot pain-she will use postop shoe intermittently gradually increase activity over the next 4 weeks if unable to walk without significant discomfort then call us we will set up with podiatry Chronic lumbar pain-sshe had a recent MRI completed. She will see the back specialist on Monday. They will evaluate whether or not to do further surgery. If no further surgery then they may need other medications or may send her back to [place] regarding pain medications. In addition to this patient is limited on her range of motion and her activity there is no way that she can return to work currently. She is thinking about applying for disability which I think is reasonable. She ought to followup in 3 months time.

## 2013-03-03 ENCOUNTER — Telehealth: Payer: Self-pay | Admitting: Family Medicine

## 2013-03-03 NOTE — Telephone Encounter (Signed)
Ms Joanna Fuller, RN is wanting to know what Ms Joanna [name] is supposed to be doing for her diabetes? Can we please call to advise her on the care for this patient.  Thanks

## 2013-03-03 NOTE — Telephone Encounter (Signed)
We can only communicate this information once the patient has signed a release allowing [place] to do so

## 2013-03-03 NOTE — Telephone Encounter (Signed)
Notified Ivin Booty that we can only communicate this information once the patient has signed a release allowing [place] to do so. She verbalized understanding.

## 2013-03-14 ENCOUNTER — Other Ambulatory Visit: Payer: Self-pay | Admitting: Family Medicine

## 2013-03-21 ENCOUNTER — Other Ambulatory Visit: Payer: Self-pay | Admitting: Family Medicine

## 2013-03-28 ENCOUNTER — Ambulatory Visit (INDEPENDENT_AMBULATORY_CARE_PROVIDER_SITE_OTHER): Payer: BC Managed Care – PPO | Admitting: Nurse Practitioner

## 2013-03-28 ENCOUNTER — Encounter: Payer: Self-pay | Admitting: Nurse Practitioner

## 2013-03-28 VITALS — BP 114/86 | Temp 98.7°F | Wt 181.0 lb

## 2013-03-28 DIAGNOSIS — J0100 Acute maxillary sinusitis, unspecified: Secondary | ICD-10-CM

## 2013-03-28 MED ORDER — METHYLPREDNISOLONE ACETATE 40 MG/ML IJ SUSP
40.0000 mg | Freq: Once | INTRAMUSCULAR | Status: AC
  Administered 2013-03-28: 40 mg via INTRAMUSCULAR

## 2013-03-28 MED ORDER — METHYLPREDNISOLONE ACETATE 40 MG/ML IJ SUSP: 40.0000 mg | Freq: Once | INTRAMUSCULAR | Status: DC

## 2013-03-28 MED ORDER — ALPRAZOLAM 0.5 MG PO TABS: ORAL_TABLET | ORAL | Status: DC

## 2013-03-28 MED ORDER — AMOXICILLIN-POT CLAVULANATE 875-125 MG PO TABS: 1.0000 | ORAL_TABLET | Freq: Two times a day (BID) | ORAL | Status: DC

## 2013-03-28 MED ORDER — PREDNISONE 20 MG PO TABS: ORAL_TABLET | ORAL | Status: DC

## 2013-04-01 ENCOUNTER — Encounter: Payer: Self-pay | Admitting: Nurse Practitioner

## 2013-04-01 NOTE — Progress Notes (Signed)
Subjective:  Presents for complaints of sinus headache for the past 5 days. No fever. Maxillary area sinus pressure. Slight cough. Head congestion. Slight wheeze at nighttime. No sore throat. Some ear pain.  Objective:   BP 114/86  Temp(Src) 98.7 F (37.1 C) (Oral)  Wt 181 lb (82.101 kg)  BMI 33.39 kg/m2 NAD. Alert, oriented. TMs significant clear effusion, no erythema. Pharynx injected with PND noted. Neck supple with mild soft slightly tender adenopathy. Lungs clear. Heart regular rate rhythm.  Assessment:Maxillary sinusitis, acute - Plan: methylPREDNISolone acetate (DEPO-MEDROL) injection 40 mg, methylPREDNISolone acetate (DEPO-MEDROL) injection 40 mg  Plan: Meds ordered this encounter  Medications  . methylPREDNISolone acetate (DEPO-MEDROL) injection 40 mg    Sig:   . amoxicillin-clavulanate (AUGMENTIN) 875-125 MG per tablet    Sig: Take 1 tablet by mouth 2 (two) times daily.    Dispense:  20 tablet    Refill:  0    Order Specific Question:  Supervising Provider    Answer:  [name] [[id number]]  . ALPRAZolam (XANAX) 0.5 MG tablet    Sig: 1/2 to 1 po BID prn anxiety    Dispense:  30 tablet    Refill:  0    Order Specific Question:  Supervising Provider    Answer:  [name] [[id number]]  . predniSONE (DELTASONE) 20 MG tablet    Sig: 3 po qd x 3 d then 2 po qd x 3 d then 1 po qd x 3 d    Dispense:  18 tablet    Refill:  0    Order Specific Question:  Supervising Provider    Answer:  [name] [[id number]]  . methylPREDNISolone acetate (DEPO-MEDROL) injection 40 mg    Sig:    Given prescription for prednisone taper in case symptoms are not any better over the next 48 hours. Call back next week if no improvement, sooner if worse. Also end of visit patient requested refill on her Xanax which she is taking sparingly. Recommend routine followup for other health issues.

## 2013-04-10 ENCOUNTER — Telehealth: Payer: Self-pay | Admitting: Family Medicine

## 2013-04-10 NOTE — Telephone Encounter (Signed)
Continue Omeprazole.  Add Carafate 1GM po AC and HS as needed for acid reflux.  Call back next week if no better.  Recommend OV with [place] or GI specialist.

## 2013-04-10 NOTE — Telephone Encounter (Signed)
Pt states her acid reflux meds are not working for her, can you call in any additional meds to [facility]

## 2013-04-11 ENCOUNTER — Other Ambulatory Visit: Payer: Self-pay

## 2013-04-11 MED ORDER — SUCRALFATE 1 G PO TABS: ORAL_TABLET | ORAL | Status: DC

## 2013-04-11 NOTE — Telephone Encounter (Signed)
Notified patient continue Omeprazole. Add Carafate 1GM po AC and HS as needed for acid reflux. Call back next week if no better. Recommend OV with [place] or GI specialist. [facility]. Patient verbalized understanding.

## 2013-05-14 ENCOUNTER — Other Ambulatory Visit: Payer: Self-pay | Admitting: Family Medicine

## 2013-05-21 ENCOUNTER — Ambulatory Visit (INDEPENDENT_AMBULATORY_CARE_PROVIDER_SITE_OTHER): Payer: BC Managed Care – PPO | Admitting: Family Medicine

## 2013-05-21 ENCOUNTER — Encounter: Payer: Self-pay | Admitting: Family Medicine

## 2013-05-21 VITALS — BP 130/82 | Temp 98.3°F | Ht 62.5 in | Wt 191.4 lb

## 2013-05-21 DIAGNOSIS — E785 Hyperlipidemia, unspecified: Secondary | ICD-10-CM

## 2013-05-21 DIAGNOSIS — M109 Gout, unspecified: Secondary | ICD-10-CM

## 2013-05-21 DIAGNOSIS — E119 Type 2 diabetes mellitus without complications: Secondary | ICD-10-CM

## 2013-05-21 DIAGNOSIS — E039 Hypothyroidism, unspecified: Secondary | ICD-10-CM

## 2013-05-21 DIAGNOSIS — Z79899 Other long term (current) drug therapy: Secondary | ICD-10-CM

## 2013-05-21 DIAGNOSIS — R5381 Other malaise: Secondary | ICD-10-CM

## 2013-05-21 DIAGNOSIS — R5383 Other fatigue: Secondary | ICD-10-CM

## 2013-05-21 LAB — POCT GLYCOSYLATED HEMOGLOBIN (HGB A1C): Hemoglobin A1C: 7.1

## 2013-05-21 NOTE — Progress Notes (Signed)
  Subjective:    Patient ID: Joanna [name], female    DOB: 1951/07/01, 62 y.o.   MRN: [id number]  Rash This is a new problem. The current episode started 1 to 4 weeks ago. The problem has been gradually worsening since onset. Pain location: all over body. The rash is characterized by itchiness. It is unknown if there was an exposure to a precipitant. Past treatments include nothing. The treatment provided no relief.   Patient states that she has had a gout flare up now for about 3 days in her feet. She also needs a refill on her Accu Check strips.  Patient is also having significant ongoing trouble with low back pain radiates down the legs she relates that it's very uncomfortable. She has tried multiple different things for it she even is seen a back specialist they or contemplating further intervention. They're going to be doing some injections as well.  Review of Systems  Skin: Positive for rash.   it should be noted that the patient did submit a letter regarding the problems that her back is causing her. Between her multiple health problems including blood pressure diabetes thyroid in addition to this flareups of gallop plus also renal insufficiency and also significant back problems not responding so far to injections and therapy this patient has not been able to work. I do not believe that she is able to do her work. I do not believe that she can do any significant pushing pulling lifting twisting. Any significant standing past 30-40 minutes require sitting down. Sitting for any length of time is also something for which she has to change frequently in positions and often has to lay down. She is currently seeing a pain management doctor for injections.     Objective:   Physical Exam Lungs are clear hearts regular pulse normal subjective discomfort in lower back positive straight leg raise bilateral foot exam is normal pulses are normal calves nontender  Hemoglobin A1c much improved 7.1      Assessment & Plan:  Diabetes-improvement she will get lab work done followup next scheduled visit to discuss this #2 patient has significant renal insufficiency will look at this issue again. If creatinine starts getting in the 2.0 range or greater she will need referral back to the nephrologist #3 chronic back pain-patient right now does not appear that she's can be of a work in in my opinion I feel she is disabled. Please see the medications above. S patient submitted will be scanned into the record.

## 2013-05-26 LAB — URIC ACID: Uric Acid, Serum: 9.6 mg/dL — ABNORMAL HIGH (ref 2.4–7.0)

## 2013-05-26 LAB — BASIC METABOLIC PANEL
BUN: 26 mg/dL — ABNORMAL HIGH (ref 6–23)
CO2: 24 mEq/L (ref 19–32)
Calcium: 10.2 mg/dL (ref 8.4–10.5)
Chloride: 106 mEq/L (ref 96–112)
Creat: 1.68 mg/dL — ABNORMAL HIGH (ref 0.50–1.10)
Glucose, Bld: 135 mg/dL — ABNORMAL HIGH (ref 70–99)
Potassium: 4.2 mEq/L (ref 3.5–5.3)
Sodium: 141 mEq/L (ref 135–145)

## 2013-05-26 LAB — LIPID PANEL
Cholesterol: 173 mg/dL (ref 0–200)
HDL: 55 mg/dL (ref >39)
LDL Cholesterol: 88 mg/dL (ref 0–99)
Total CHOL/HDL Ratio: 3.1 Ratio
Triglycerides: 151 mg/dL — ABNORMAL HIGH (ref <150)
VLDL: 30 mg/dL (ref 0–40)

## 2013-05-26 LAB — HEPATIC FUNCTION PANEL
ALT: 13 U/L (ref 0–35)
AST: 14 U/L (ref 0–37)
Albumin: 4 g/dL (ref 3.5–5.2)
Alkaline Phosphatase: 85 U/L (ref 39–117)
Bilirubin, Direct: <0.1 mg/dL (ref 0.0–0.3)
Total Bilirubin: 0.2 mg/dL — ABNORMAL LOW (ref 0.3–1.2)
Total Protein: 6.5 g/dL (ref 6.0–8.3)

## 2013-05-26 LAB — VITAMIN B12: Vitamin B-12: 531 pg/mL (ref 211–911)

## 2013-05-26 LAB — TSH: TSH: 2.142 u[IU]/mL (ref 0.350–4.500)

## 2013-05-30 ENCOUNTER — Ambulatory Visit: Payer: BC Managed Care – PPO | Admitting: Family Medicine

## 2013-06-04 ENCOUNTER — Ambulatory Visit (INDEPENDENT_AMBULATORY_CARE_PROVIDER_SITE_OTHER): Payer: BC Managed Care – PPO | Admitting: Family Medicine

## 2013-06-04 ENCOUNTER — Encounter: Payer: Self-pay | Admitting: Family Medicine

## 2013-06-04 VITALS — BP 100/68 | Ht 63.0 in | Wt 189.6 lb

## 2013-06-04 DIAGNOSIS — M109 Gout, unspecified: Secondary | ICD-10-CM

## 2013-06-04 DIAGNOSIS — E039 Hypothyroidism, unspecified: Secondary | ICD-10-CM

## 2013-06-04 DIAGNOSIS — N289 Disorder of kidney and ureter, unspecified: Secondary | ICD-10-CM

## 2013-06-04 DIAGNOSIS — E119 Type 2 diabetes mellitus without complications: Secondary | ICD-10-CM

## 2013-06-04 DIAGNOSIS — I10 Essential (primary) hypertension: Secondary | ICD-10-CM

## 2013-06-04 DIAGNOSIS — E785 Hyperlipidemia, unspecified: Secondary | ICD-10-CM

## 2013-06-04 MED ORDER — TRIAMCINOLONE ACETONIDE 0.1 % EX CREA: TOPICAL_CREAM | Freq: Two times a day (BID) | CUTANEOUS | Status: DC

## 2013-06-04 MED ORDER — COLCHICINE 0.6 MG PO TABS: 0.6000 mg | ORAL_TABLET | Freq: Two times a day (BID) | ORAL | Status: DC

## 2013-06-04 MED ORDER — FEBUXOSTAT 40 MG PO TABS: 80.0000 mg | ORAL_TABLET | Freq: Every day | ORAL | Status: DC

## 2013-06-04 MED ORDER — LOSARTAN POTASSIUM 50 MG PO TABS: 50.0000 mg | ORAL_TABLET | Freq: Every day | ORAL | Status: DC

## 2013-06-04 NOTE — Patient Instructions (Addendum)
Gout Gout is an inflammatory condition (arthritis) caused by a buildup of uric acid crystals in the joints. Uric acid is a chemical that is normally present in the blood. Under some circumstances, uric acid can form into crystals in your joints. This causes joint redness, soreness, and swelling (inflammation). Repeat attacks are common. Over time, uric acid crystals can form into masses (tophi) near a joint, causing disfigurement. Gout is treatable and often preventable. CAUSES  The disease begins with elevated levels of uric acid in the blood. Uric acid is produced by your body when it breaks down a naturally found substance called purines. This also happens when you eat certain foods such as meats and fish. Causes of an elevated uric acid level include:  Being passed down from parent to child (heredity).  Diseases that cause increased uric acid production (obesity, psoriasis, some cancers).  Excessive alcohol use.  Diet, especially diets rich in meat and seafood.  Medicines, including certain cancer-fighting drugs (chemotherapy), diuretics, and aspirin.  Chronic kidney disease. The kidneys are no longer able to remove uric acid well.  Problems with metabolism. Conditions strongly associated with gout include:  Obesity.  High blood pressure.  High cholesterol.  Diabetes. Not everyone with elevated uric acid levels gets gout. It is not understood why some people get gout and others do not. Surgery, joint injury, and eating too much of certain foods are some of the factors that can lead to gout. SYMPTOMS   An attack of gout comes on quickly. It causes intense pain with redness, swelling, and warmth in a joint.  Fever can occur.  Often, only one joint is involved. Certain joints are more commonly involved:  Base of the big toe.  Knee.  Ankle.  Wrist.  Finger. Without treatment, an attack usually goes away in a few days to weeks. Between attacks, you usually will not have  symptoms, which is different from many other forms of arthritis. DIAGNOSIS  Your caregiver will suspect gout based on your symptoms and exam. Removal of fluid from the joint (arthrocentesis) is done to check for uric acid crystals. Your caregiver will give you a medicine that numbs the area (local anesthetic) and use a needle to remove joint fluid for exam. Gout is confirmed when uric acid crystals are seen in joint fluid, using a special microscope. Sometimes, blood, urine, and X-ray tests are also used. TREATMENT  There are 2 phases to gout treatment: treating the sudden onset (acute) attack and preventing attacks (prophylaxis). Treatment of an Acute Attack  Medicines are used. These include anti-inflammatory medicines or steroid medicines.  An injection of steroid medicine into the affected joint is sometimes necessary.  The painful joint is rested. Movement can worsen the arthritis.  You may use warm or cold treatments on painful joints, depending which works best for you.  Discuss the use of coffee, vitamin C, or cherries with your caregiver. These may be helpful treatment options. Treatment to Prevent Attacks After the acute attack subsides, your caregiver may advise prophylactic medicine. These medicines either help your kidneys eliminate uric acid from your body or decrease your uric acid production. You may need to stay on these medicines for a very long time. The early phase of treatment with prophylactic medicine can be associated with an increase in acute gout attacks. For this reason, during the first few months of treatment, your caregiver may also advise you to take medicines usually used for acute gout treatment. Be sure you understand your caregiver's directions.   You should also discuss dietary treatment with your caregiver. Certain foods such as meats and fish can increase uric acid levels. Other foods such as dairy can decrease levels. Your caregiver can give you a list of foods  to avoid. HOME CARE INSTRUCTIONS   Do not take aspirin to relieve pain. This raises uric acid levels.  Only take over-the-counter or prescription medicines for pain, discomfort, or fever as directed by your caregiver.  Rest the joint as much as possible. When in bed, keep sheets and blankets off painful areas.  Keep the affected joint raised (elevated).  Use crutches if the painful joint is in your leg.  Drink enough water and fluids to keep your urine clear or pale yellow. This helps your body get rid of uric acid. Do not drink alcoholic beverages. They slow the passage of uric acid.  Follow your caregiver's dietary instructions. Pay careful attention to the amount of protein you eat. Your daily diet should emphasize fruits, vegetables, whole grains, and fat-free or low-fat milk products.  Maintain a healthy body weight. SEEK MEDICAL CARE IF:   You have an oral temperature above 102 F (38.9 C).  You develop diarrhea, vomiting, or any side effects from medicines.  You do not feel better in 24 hours, or you are getting worse. SEEK IMMEDIATE MEDICAL CARE IF:   Your joint becomes suddenly more tender and you have:  Chills.  An oral temperature above 102 F (38.9 C), not controlled by medicine. MAKE SURE YOU:   Understand these instructions.  Will watch your condition.  Will get help right away if you are not doing well or get worse. Document Released: 09/29/2000 Document Revised: 12/25/2011 Document Reviewed: 01/10/2010 [facility] Patient Information 2014 [place].  Stop losartan [id number]  Use losartan 50 one daily  Gout colcyrs one twice a day for 3 weeks (if diarrhea then 1 per day) Uloric one per day  Recheck 4 to 6 weeks

## 2013-06-04 NOTE — Progress Notes (Addendum)
  Subjective:    Patient ID: Joanna [name], female    DOB: [birth year], 62 y.o.   MRN: [id number]  HPI Patient is here for 3 month follow up  Patient is concerned about an rash that is now peeling. Patient says the rash is all over her body and it came up after she had an injection from the neurosurgeon Long discussion in today to go over the results of her lab work. We went over both glucose control thyroid triglyceride and uric acid results. We also discussed at length renal insufficiency and the treatment help keep it under control. We also discussed blood pressure control  Review of Systems Denies chest tightness shortness breath swelling in the legs denies vomiting diarrhea rectal bleeding    Objective:   Physical Exam Lungs are clear no crackles heart is regular pulse normal blood pressure is low laying sitting standing was low abdomen soft extremities no edema       Assessment & Plan:  Gout- Uloric recommended. Recheck uric acid level in approximately 6-8 weeks, colcrys will be used twice daily for the next few weeks to reduce risk of flaring a gout attack with starting the other medicine. Renal insufficiency keep blood pressure cholesterol and control low-protein diet also reduce uric acid level. Check metabolic 7 level 6-8 weeks, stop all anti-inflammatories. Hypertension reduce medication losartan 50 mg once daily. Patient followup in 3 months time Diabetes good control  It should also be noted that because patient did have multiple hives all over when she took the medication she would like to have an EpiPen. She states that she has used EpiPen before in the past with other allergies. [facility]

## 2013-06-05 ENCOUNTER — Telehealth: Payer: Self-pay | Admitting: Family Medicine

## 2013-06-05 DIAGNOSIS — E039 Hypothyroidism, unspecified: Secondary | ICD-10-CM | POA: Insufficient documentation

## 2013-06-05 DIAGNOSIS — N289 Disorder of kidney and ureter, unspecified: Secondary | ICD-10-CM | POA: Insufficient documentation

## 2013-06-05 DIAGNOSIS — I10 Essential (primary) hypertension: Secondary | ICD-10-CM | POA: Insufficient documentation

## 2013-06-05 NOTE — Telephone Encounter (Signed)
Pharmacy does have prescriptions. Patient notified.

## 2013-06-05 NOTE — Telephone Encounter (Signed)
[facility] [place] [facility] that was supposed to be called in yesterday for Uric acid, gout and epi pen

## 2013-06-20 DIAGNOSIS — Z0289 Encounter for other administrative examinations: Secondary | ICD-10-CM

## 2013-06-30 LAB — BASIC METABOLIC PANEL
BUN: 14 mg/dL (ref 6–23)
CO2: 26 mEq/L (ref 19–32)
Calcium: 10 mg/dL (ref 8.4–10.5)
Chloride: 109 mEq/L (ref 96–112)
Creat: 1.33 mg/dL — ABNORMAL HIGH (ref 0.50–1.10)
Glucose, Bld: 130 mg/dL — ABNORMAL HIGH (ref 70–99)
Potassium: 3.9 mEq/L (ref 3.5–5.3)
Sodium: 142 mEq/L (ref 135–145)

## 2013-06-30 LAB — URIC ACID: Uric Acid, Serum: 3.5 mg/dL (ref 2.4–7.0)

## 2013-07-07 ENCOUNTER — Encounter: Payer: Self-pay | Admitting: Family Medicine

## 2013-07-07 ENCOUNTER — Ambulatory Visit (INDEPENDENT_AMBULATORY_CARE_PROVIDER_SITE_OTHER): Payer: BC Managed Care – PPO | Admitting: Family Medicine

## 2013-07-07 VITALS — BP 130/98 | Temp 98.6°F | Ht 62.0 in | Wt 189.4 lb

## 2013-07-07 DIAGNOSIS — E119 Type 2 diabetes mellitus without complications: Secondary | ICD-10-CM

## 2013-07-07 DIAGNOSIS — J019 Acute sinusitis, unspecified: Secondary | ICD-10-CM

## 2013-07-07 DIAGNOSIS — Z23 Encounter for immunization: Secondary | ICD-10-CM

## 2013-07-07 MED ORDER — NIFEDIPINE ER OSMOTIC RELEASE 30 MG PO TB24: ORAL_TABLET | ORAL | Status: DC

## 2013-07-07 MED ORDER — AMOXICILLIN 500 MG PO TABS: ORAL_TABLET | ORAL | Status: DC

## 2013-07-07 MED ORDER — GLIPIZIDE 5 MG PO TABS: ORAL_TABLET | ORAL | Status: DC

## 2013-07-07 MED ORDER — LOSARTAN POTASSIUM 50 MG PO TABS: 50.0000 mg | ORAL_TABLET | Freq: Every day | ORAL | Status: DC

## 2013-07-07 MED ORDER — FLUTICASONE PROPIONATE 50 MCG/ACT NA SUSP: 2.0000 | NASAL | Status: DC | PRN

## 2013-07-07 MED ORDER — HYDROCODONE-ACETAMINOPHEN 5-325 MG PO TABS: 1.0000 | ORAL_TABLET | Freq: Four times a day (QID) | ORAL | Status: DC | PRN

## 2013-07-07 MED ORDER — PRAVASTATIN SODIUM 20 MG PO TABS: ORAL_TABLET | ORAL | Status: DC

## 2013-07-07 NOTE — Progress Notes (Signed)
  Subjective:    Patient ID: Joanna [name], female    DOB: [birth year], 62 y.o.   MRN: [id number]  HPI Patient here today for f/u visit from 8/6. She got BW done and is ready to discuss results.  She's had some problems with renal insufficiency as well as gout. She is currently taking her medicines watching her diet. Also complaining of sinus infection that started Saturday. Denies fever.  The patient was seen today as part of a comprehensive diabetic check up. The patient had the following elements completed: -Review of medication compliance -Review of glucose monitoring results -Review of any complications do to high or low sugars -Diabetic foot exam was completed as part of today's visit. The following was also discussed: -Importance of yearly eye exams -Importance of following diabetic/low sugar-starch diet -Importance of exercise and regular activity -Importance of regular followup visits. -Most recent hemoglobin A1c were reviewed with the patient along with goals regarding diabetes.   Review of Systems Denies any chest tightness joint pains sweats chills nausea vomiting    Objective:   Physical Exam Lungs are clear hearts regular pulse normal foot exam normal pulses are good       Assessment & Plan:  #1 diabetes decent control check hemoglobin A1c later this year #2 uric acid level great. Continue Uloric no reason to take colchicine currently #3 renal insufficiency actually much improved continue current measures Patient currently is disabled. She is seeing specialists for her back they've tried injections without help she's had surgery. She is not capable of doing her job currently. She is found for disability. I support her with this. I do not feel that she is capable of holding her regular job I do not feel she is capable of any significant squatting pushing pulling. She has to take pain medicines on a regular basis to help her with her discomfort.

## 2013-08-01 ENCOUNTER — Other Ambulatory Visit: Payer: Self-pay | Admitting: *Deleted

## 2013-08-01 MED ORDER — LOSARTAN POTASSIUM 50 MG PO TABS: 50.0000 mg | ORAL_TABLET | Freq: Every day | ORAL | Status: DC

## 2013-08-25 ENCOUNTER — Ambulatory Visit (INDEPENDENT_AMBULATORY_CARE_PROVIDER_SITE_OTHER): Payer: BC Managed Care – PPO | Admitting: Family Medicine

## 2013-08-25 ENCOUNTER — Encounter: Payer: Self-pay | Admitting: Family Medicine

## 2013-08-25 VITALS — BP 132/90 | Temp 98.6°F | Ht 62.0 in | Wt 194.0 lb

## 2013-08-25 DIAGNOSIS — E119 Type 2 diabetes mellitus without complications: Secondary | ICD-10-CM

## 2013-08-25 DIAGNOSIS — I10 Essential (primary) hypertension: Secondary | ICD-10-CM

## 2013-08-25 DIAGNOSIS — M109 Gout, unspecified: Secondary | ICD-10-CM

## 2013-08-25 DIAGNOSIS — E785 Hyperlipidemia, unspecified: Secondary | ICD-10-CM

## 2013-08-25 DIAGNOSIS — N289 Disorder of kidney and ureter, unspecified: Secondary | ICD-10-CM

## 2013-08-25 MED ORDER — LEVOFLOXACIN 500 MG PO TABS: 500.0000 mg | ORAL_TABLET | Freq: Every day | ORAL | Status: AC

## 2013-08-25 NOTE — Progress Notes (Signed)
  Subjective:    Patient ID: Joanna [name], female    DOB: Jan 09, 1951, 62 y.o.   MRN: [id number]  Cough This is a new problem. The current episode started in the past 7 days. Associated symptoms include headaches. Treatments tried: tylenol.   she relates head congestion drainage coughing sinus pressure symptoms been going on for the past several days denies any high fever denies nausea vomiting diarrhea This patient also relates needs followup regarding her other medications. Also her blood pressure is slightly elevated.   Review of Systems  Respiratory: Positive for cough.   Neurological: Positive for headaches.       Objective:   Physical Exam  Vitals reviewed. Constitutional: She appears well-developed and well-nourished.  HENT:  Head: Normocephalic.  Right Ear: External ear normal.  Left Ear: External ear normal.  Neck: Neck supple.  Cardiovascular: Normal rate, regular rhythm and normal heart sounds.   No murmur heard. Pulmonary/Chest: Effort normal and breath sounds normal. No respiratory distress. She has no wheezes.  Musculoskeletal: She exhibits no edema.  Lymphadenopathy:    She has no cervical adenopathy.  Skin: No erythema.          Assessment & Plan:  #1 adjust blood pressure upward. Number to followup her diabetes mid-December do lab work performed following up #3 sinusitis antibiotics prescribed

## 2013-09-01 ENCOUNTER — Telehealth: Payer: Self-pay | Admitting: Family Medicine

## 2013-09-01 ENCOUNTER — Other Ambulatory Visit: Payer: Self-pay

## 2013-09-01 MED ORDER — NIFEDIPINE ER 60 MG PO TB24: 60.0000 mg | ORAL_TABLET | Freq: Every day | ORAL | Status: DC

## 2013-09-01 MED ORDER — FEBUXOSTAT 40 MG PO TABS: 80.0000 mg | ORAL_TABLET | Freq: Every day | ORAL | Status: DC

## 2013-09-01 NOTE — Telephone Encounter (Signed)
Medication sent to pharmacy in [place]. Patient was notified and to schedule follow up visit in 2-3 months to keep log of blood pressure readings. Patient stated that she is having swelling in her ankles also. Please advise.

## 2013-09-01 NOTE — Telephone Encounter (Signed)
Patient would also like [facility] day Rx [facility]

## 2013-09-01 NOTE — Telephone Encounter (Signed)
She may have 90 day prescription for Uloric. Also increase nifedipine extended release from 30 mg to 60 mg. Have patient followup office visit in approximately 2-3 months to recheck blood pressure. Let her know I did review over her blood pressure readings I would like to see the majority of her readings in the 130s/80 range or less

## 2013-09-01 NOTE — Telephone Encounter (Signed)
BP Readings for last week  Tues-132/89 Wed-124/80 Thurs-135/91 Fri-125/78 Sat-135/100 Sun-130/95 Mon-130/91

## 2013-09-02 MED ORDER — VERAPAMIL HCL ER 240 MG PO TBCR: 240.0000 mg | EXTENDED_RELEASE_TABLET | Freq: Every day | ORAL | Status: DC

## 2013-09-02 NOTE — Telephone Encounter (Signed)
Due to edema in ankles stop Nifedipine ( as discussed with Calandra) instead now start verapamil SR 240 mg,#30,1 qd. I rec f/u OV in a few weeks to check BP please

## 2013-09-02 NOTE — Telephone Encounter (Signed)
Notified patient due to edema in ankles to stop Nifedipine and start Verapamil SR 240 mg #30 1 qd. Rec f.u OV in a few weeks to check BP. Medication was sent to pharmacy. Patient verbalized understanding.

## 2013-09-15 ENCOUNTER — Encounter: Payer: Self-pay | Admitting: Family Medicine

## 2013-09-15 ENCOUNTER — Ambulatory Visit (INDEPENDENT_AMBULATORY_CARE_PROVIDER_SITE_OTHER): Payer: BC Managed Care – PPO | Admitting: Family Medicine

## 2013-09-15 VITALS — BP 136/86 | Ht 62.0 in | Wt 200.0 lb

## 2013-09-15 DIAGNOSIS — I10 Essential (primary) hypertension: Secondary | ICD-10-CM

## 2013-09-15 MED ORDER — LOSARTAN POTASSIUM 100 MG PO TABS: 100.0000 mg | ORAL_TABLET | Freq: Every day | ORAL | Status: DC

## 2013-09-15 MED ORDER — COLCHICINE 0.6 MG PO TABS: 0.6000 mg | ORAL_TABLET | Freq: Two times a day (BID) | ORAL | Status: DC

## 2013-09-15 NOTE — Progress Notes (Signed)
   Subjective:    Patient ID: Joanna [name], female    DOB: June 13, 1951, 62 y.o.   MRN: [id number]  HPI  Patient arrives to discuss high blood pressure and swelling in legs and ankles. Verapamil was tried but unfortunately patient had problems with it Also Cozaar was reduced from 100/25 down to 50 because her renal insufficiency/patient will be getting lab work later this month in followup for diabetes. Denies any chest tightness pressure pain shortness breath history of hypertension family history hypertension Review of Systems     Objective:   Physical Exam Lungs clear heart regular blood pressure are borderline but acceptable pedal edema 1+ more in the ankles and mid shin      Assessment & Plan:  #1 stop verapamil due to side effects of swelling #2 HTN increase Cozaar 100 mg daily Send [place] blood pressure readings next week may need to add Diuril take if still having swelling. Lab work in a few weeks time for followup diabetic care

## 2013-09-15 NOTE — Patient Instructions (Addendum)
Stop Verapamil  We will increase Losartan from 50 to 100mg   We may need to start a diuretic BUT I would like to see if your swelling goes down AND your blood pressure improves by the changes above  Send me some readings later next week

## 2013-09-25 ENCOUNTER — Telehealth: Payer: Self-pay | Admitting: Family Medicine

## 2013-09-25 MED ORDER — INDAPAMIDE 1.25 MG PO TABS: 1.2500 mg | ORAL_TABLET | Freq: Every day | ORAL | Status: DC

## 2013-09-25 NOTE — Telephone Encounter (Signed)
pts BP readings for this week are:  12/04 - 159/115 12/05 - 163/118 120/6 - 165/117 120/7 - 148/85 120/8 - 152/95 12/09 - 159/123 12/10 - 159/106 12/11 - 144/100

## 2013-09-25 NOTE — Telephone Encounter (Signed)
Add Lozol 1.25 daily , this is a diarrhea reticulocyte. It should help with bringing blood pressure down. I would recommend #30 with 4 refills. One each morning. I also recommend repeat metabolic 7 in 2-3 weeks followup office visit in 6-8 weeks. Send [place] the readings in a few weeks time.

## 2013-09-25 NOTE — Telephone Encounter (Signed)
Notified patient add Lozol 1.25 daily , this is a diarrhea reticulocyte. It should help with bringing blood pressure.  Recommend repeat metabolic 7 in 2-3 weeks followup office visit in 6-8 weeks. Send [place] the readings in a few weeks time. Blood work order already in the system from 08/25/13. Patient states she will have blood work done and already has appointment scheduled for the 23rd. Medication sent to pharmacy.

## 2013-09-27 ENCOUNTER — Other Ambulatory Visit: Payer: Self-pay | Admitting: Family Medicine

## 2013-09-27 DIAGNOSIS — Z0289 Encounter for other administrative examinations: Secondary | ICD-10-CM

## 2013-09-29 LAB — LIPID PANEL
Cholesterol: 150 mg/dL (ref 0–200)
HDL: 53 mg/dL (ref >39)
LDL Cholesterol: 67 mg/dL (ref 0–99)
Total CHOL/HDL Ratio: 2.8 Ratio
Triglycerides: 150 mg/dL — ABNORMAL HIGH (ref <150)
VLDL: 30 mg/dL (ref 0–40)

## 2013-09-29 LAB — BASIC METABOLIC PANEL
BUN: 20 mg/dL (ref 6–23)
CO2: 26 mEq/L (ref 19–32)
Calcium: 10.1 mg/dL (ref 8.4–10.5)
Chloride: 106 mEq/L (ref 96–112)
Creat: 1.43 mg/dL — ABNORMAL HIGH (ref 0.50–1.10)
Glucose, Bld: 114 mg/dL — ABNORMAL HIGH (ref 70–99)
Potassium: 4.2 mEq/L (ref 3.5–5.3)
Sodium: 140 mEq/L (ref 135–145)

## 2013-09-29 LAB — HEPATIC FUNCTION PANEL
ALT: 30 U/L (ref 0–35)
AST: 22 U/L (ref 0–37)
Albumin: 4.2 g/dL (ref 3.5–5.2)
Alkaline Phosphatase: 104 U/L (ref 39–117)
Bilirubin, Direct: 0.1 mg/dL (ref 0.0–0.3)
Indirect Bilirubin: 0.2 mg/dL (ref 0.0–0.9)
Total Bilirubin: 0.3 mg/dL (ref 0.3–1.2)
Total Protein: 6.4 g/dL (ref 6.0–8.3)

## 2013-09-29 LAB — URIC ACID: Uric Acid, Serum: 5.8 mg/dL (ref 2.4–7.0)

## 2013-09-29 LAB — HEMOGLOBIN A1C
Hgb A1c MFr Bld: 6.9 % — ABNORMAL HIGH (ref <5.7)
Mean Plasma Glucose: 151 mg/dL — ABNORMAL HIGH (ref <117)

## 2013-09-30 LAB — MICROALBUMIN, URINE: Microalb, Ur: 58.4 mg/dL — ABNORMAL HIGH (ref 0.00–1.89)

## 2013-10-07 ENCOUNTER — Encounter: Payer: Self-pay | Admitting: Family Medicine

## 2013-10-07 ENCOUNTER — Ambulatory Visit (INDEPENDENT_AMBULATORY_CARE_PROVIDER_SITE_OTHER): Payer: BC Managed Care – PPO | Admitting: Family Medicine

## 2013-10-07 VITALS — BP 136/90 | Ht 62.0 in | Wt 196.1 lb

## 2013-10-07 DIAGNOSIS — E119 Type 2 diabetes mellitus without complications: Secondary | ICD-10-CM

## 2013-10-07 DIAGNOSIS — I10 Essential (primary) hypertension: Secondary | ICD-10-CM

## 2013-10-07 DIAGNOSIS — N289 Disorder of kidney and ureter, unspecified: Secondary | ICD-10-CM

## 2013-10-07 MED ORDER — HYDROCODONE-ACETAMINOPHEN 5-325 MG PO TABS: 1.0000 | ORAL_TABLET | Freq: Four times a day (QID) | ORAL | Status: DC | PRN

## 2013-10-07 MED ORDER — INDAPAMIDE 2.5 MG PO TABS: 2.5000 mg | ORAL_TABLET | Freq: Every day | ORAL | Status: DC

## 2013-10-07 MED ORDER — CEFPROZIL 500 MG PO TABS: 500.0000 mg | ORAL_TABLET | Freq: Two times a day (BID) | ORAL | Status: DC

## 2013-10-07 NOTE — Patient Instructions (Signed)
DASH Diet  The DASH diet stands for "Dietary Approaches to Stop Hypertension." It is a healthy eating plan that has been shown to reduce high blood pressure (hypertension) in as little as 14 days, while also possibly providing other significant health benefits. These other health benefits include reducing the risk of breast cancer after menopause and reducing the risk of type 2 diabetes, heart disease, colon cancer, and stroke. Health benefits also include weight loss and slowing kidney failure in patients with chronic kidney disease.   DIET GUIDELINES  · Limit salt (sodium). Your diet should contain less than 1500 mg of sodium daily.  · Limit refined or processed carbohydrates. Your diet should include mostly whole grains. Desserts and added sugars should be used sparingly.  · Include small amounts of heart-healthy fats. These types of fats include nuts, oils, and tub margarine. Limit saturated and trans fats. These fats have been shown to be harmful in the body.  CHOOSING FOODS   The following food groups are based on a 2000 calorie diet. See your Registered Dietitian for individual calorie needs.  Grains and Grain Products (6 to 8 servings daily)  · Eat More Often: Whole-wheat bread, brown rice, whole-grain or wheat pasta, quinoa, popcorn without added fat or salt (air popped).  · Eat Less Often: White bread, white pasta, white rice, cornbread.  Vegetables (4 to 5 servings daily)  · Eat More Often: Fresh, frozen, and canned vegetables. Vegetables may be raw, steamed, roasted, or grilled with a minimal amount of fat.  · Eat Less Often/Avoid: Creamed or fried vegetables. Vegetables in a cheese sauce.  Fruit (4 to 5 servings daily)  · Eat More Often: All fresh, canned (in natural juice), or frozen fruits. Dried fruits without added sugar. One hundred percent fruit juice (½ cup [237 mL] daily).  · Eat Less Often: Dried fruits with added sugar. Canned fruit in light or heavy syrup.  Lean Meats, Fish, and Poultry (2  servings or less daily. One serving is 3 to 4 oz [85-114 g]).  · Eat More Often: Ninety percent or leaner ground beef, tenderloin, sirloin. Round cuts of beef, chicken breast, turkey breast. All fish. Grill, bake, or broil your meat. Nothing should be fried.  · Eat Less Often/Avoid: Fatty cuts of meat, turkey, or chicken leg, thigh, or wing. Fried cuts of meat or fish.  Dairy (2 to 3 servings)  · Eat More Often: Low-fat or fat-free milk, low-fat plain or light yogurt, reduced-fat or part-skim cheese.  · Eat Less Often/Avoid: Milk (whole, 2%). Whole milk yogurt. Full-fat cheeses.  Nuts, Seeds, and Legumes (4 to 5 servings per week)  · Eat More Often: All without added salt.  · Eat Less Often/Avoid: Salted nuts and seeds, canned beans with added salt.  Fats and Sweets (limited)  · Eat More Often: Vegetable oils, tub margarines without trans fats, sugar-free gelatin. Mayonnaise and salad dressings.  · Eat Less Often/Avoid: Coconut oils, palm oils, butter, stick margarine, cream, half and half, cookies, candy, pie.  FOR MORE INFORMATION  The Dash Diet Eating Plan: www.dashdiet.org  Document Released: 09/21/2011 Document Revised: 12/25/2011 Document Reviewed: 09/21/2011  [employer]® Patient Information ©2014 [employer].

## 2013-10-07 NOTE — Progress Notes (Signed)
   Subjective:    Patient ID: Joanna [name], female    DOB: 1951-08-19, 62 y.o.   MRN: [id number]  Diabetes She presents for her follow-up diabetic visit. She has type 2 diabetes mellitus. Her disease course has been stable. There are no hypoglycemic associated symptoms. Pertinent negatives for hypoglycemia include no confusion. There are no diabetic associated symptoms. Pertinent negatives for diabetes include no fatigue, no polydipsia, no polyphagia and no weakness. There are no hypoglycemic complications. Symptoms are stable. There are no diabetic complications. There are no known risk factors for coronary artery disease.  Patient already had blood work completed. Lab work reviewed in detail including her cholesterol looks good liver looks good A1c is much improved urine micro-protein present and mild renal insufficiency  Patient states she is having problems with her allergies. We discussed these at length it seems to be more allergies rather than true infection at this point in time if progressive troubles may need to be on antibiotics.  Patient is having high BP readings also.     Review of Systems  Constitutional: Negative for activity change, appetite change and fatigue.  Endocrine: Negative for polydipsia and polyphagia.  Genitourinary: Negative for frequency.  Neurological: Negative for weakness.  Psychiatric/Behavioral: Negative for confusion.       Objective:   Physical Exam  Vitals reviewed. Constitutional: She appears well-nourished. No distress.  Cardiovascular: Normal rate, regular rhythm and normal heart sounds.   No murmur heard. Pulmonary/Chest: Effort normal and breath sounds normal. No respiratory distress.  Musculoskeletal: She exhibits no edema.  Lymphadenopathy:    She has no cervical adenopathy.  Neurological: She is alert. She exhibits normal muscle tone.  Psychiatric: Her behavior is normal.          Assessment & Plan:  HTN subpar control and adjust  diuretic patient to send [place] some readings followup again in 4 weeks' time bring blood pressure cuff with her when she comes. We will watch renal insufficiency very closely over the course of the next year if it progressively gets worse will get nephrology involved

## 2013-10-13 ENCOUNTER — Ambulatory Visit: Payer: BC Managed Care – PPO | Admitting: Family Medicine

## 2013-10-21 ENCOUNTER — Other Ambulatory Visit: Payer: Self-pay | Admitting: Family Medicine

## 2013-11-04 ENCOUNTER — Ambulatory Visit (INDEPENDENT_AMBULATORY_CARE_PROVIDER_SITE_OTHER): Payer: BC Managed Care – PPO | Admitting: Family Medicine

## 2013-11-04 ENCOUNTER — Encounter: Payer: Self-pay | Admitting: Family Medicine

## 2013-11-04 VITALS — BP 150/98 | Ht 62.0 in | Wt 194.2 lb

## 2013-11-04 DIAGNOSIS — N289 Disorder of kidney and ureter, unspecified: Secondary | ICD-10-CM

## 2013-11-04 DIAGNOSIS — I10 Essential (primary) hypertension: Secondary | ICD-10-CM

## 2013-11-04 DIAGNOSIS — E785 Hyperlipidemia, unspecified: Secondary | ICD-10-CM

## 2013-11-04 LAB — BASIC METABOLIC PANEL
BUN: 23 mg/dL (ref 6–23)
CO2: 27 mEq/L (ref 19–32)
Calcium: 9.7 mg/dL (ref 8.4–10.5)
Chloride: 104 mEq/L (ref 96–112)
Creat: 1.54 mg/dL — ABNORMAL HIGH (ref 0.50–1.10)
Glucose, Bld: 135 mg/dL — ABNORMAL HIGH (ref 70–99)
Potassium: 4 mEq/L (ref 3.5–5.3)
Sodium: 138 mEq/L (ref 135–145)

## 2013-11-04 MED ORDER — DOXAZOSIN MESYLATE 2 MG PO TABS: 2.0000 mg | ORAL_TABLET | Freq: Every day | ORAL | Status: DC

## 2013-11-04 NOTE — Progress Notes (Signed)
   Subjective:    Patient ID: Joanna [name], female    DOB: April 29, 1951, 63 y.o.   MRN: [id number]  Hypertension This is a chronic problem. The current episode started more than 1 year ago. The problem has been gradually worsening since onset. The problem is uncontrolled. Pertinent negatives include no chest pain. There are no associated agents to hypertension. There are no known risk factors for coronary artery disease. The current treatment provides mild improvement. There are no compliance problems.   Foot Injury  The incident occurred more than 1 week ago. The incident occurred at home. The injury mechanism was a direct blow. The pain is present in the right foot. The quality of the pain is described as shooting. The pain is at a severity of 7/10. The pain is moderate. The pain has been constant since onset. Associated symptoms include an inability to bear weight. She reports no foreign bodies present. The symptoms are aggravated by movement. She has tried immobilization for the symptoms. The treatment provided no relief.  happened 9 days ago, can fell on it. Using post op shoe and buddy tape    Review of Systems  Constitutional: Negative for activity change, appetite change and fatigue.  Respiratory: Negative for cough and chest tightness.   Cardiovascular: Negative for chest pain.  Gastrointestinal: Negative for abdominal pain.  Endocrine: Negative for polydipsia and polyphagia.  Genitourinary: Negative for frequency.  Neurological: Negative for weakness.  Psychiatric/Behavioral: Negative for confusion.       Objective:   Physical Exam  Vitals reviewed. Constitutional: She appears well-nourished. No distress.  Cardiovascular: Normal rate, regular rhythm and normal heart sounds.   No murmur heard. Pulmonary/Chest: Effort normal and breath sounds normal. No respiratory distress.  Musculoskeletal: She exhibits no edema.  Mild tenderness on the fourth toe of the right foot. No  deformity noted. Some bruising noted.  Lymphadenopathy:    She has no cervical adenopathy.  Neurological: She is alert. She exhibits normal muscle tone.  Psychiatric: Her behavior is normal.          Assessment & Plan:  #1 HTN-subpar control add Cardura 2 mg each evening. She will check her blood pressure readings on a regular basis and is back to [place] over the next month her blood pressure reading does correlate with our wall unit #2 proteinuria her creatinine will be followed if it is going up she will need a 24-hour urine for protein and creatinine. #3 patient encouraged stay physically active and active watch diet try to reduce weight #4 possible toe fracture no x-ray indicated she would prefer to just wear the postop shoe the next 3 weeks she will followup if ongoing troubles

## 2013-11-06 ENCOUNTER — Other Ambulatory Visit (HOSPITAL_COMMUNITY): Payer: Self-pay | Admitting: Family Medicine

## 2013-11-06 ENCOUNTER — Ambulatory Visit (HOSPITAL_COMMUNITY)
Admission: RE | Admit: 2013-11-06 | Discharge: 2013-11-06 | Disposition: A | Discharge status: Home / Self Care | Payer: Disability Insurance | Source: Ambulatory Visit | Attending: Family Medicine | Admitting: Family Medicine

## 2013-11-06 DIAGNOSIS — M25559 Pain in unspecified hip: Secondary | ICD-10-CM

## 2013-11-06 DIAGNOSIS — M545 Low back pain, unspecified: Secondary | ICD-10-CM

## 2013-11-06 DIAGNOSIS — M5137 Other intervertebral disc degeneration, lumbosacral region: Secondary | ICD-10-CM | POA: Insufficient documentation

## 2013-11-06 DIAGNOSIS — M51379 Other intervertebral disc degeneration, lumbosacral region without mention of lumbar back pain or lower extremity pain: Secondary | ICD-10-CM | POA: Insufficient documentation

## 2013-11-07 ENCOUNTER — Encounter: Payer: Self-pay | Admitting: Family Medicine

## 2013-11-17 ENCOUNTER — Other Ambulatory Visit: Payer: Self-pay | Admitting: Family Medicine

## 2013-11-20 DIAGNOSIS — Z0289 Encounter for other administrative examinations: Secondary | ICD-10-CM

## 2013-11-24 ENCOUNTER — Other Ambulatory Visit: Payer: Self-pay | Admitting: Family Medicine

## 2014-01-07 ENCOUNTER — Encounter: Payer: Self-pay | Admitting: Family Medicine

## 2014-01-07 ENCOUNTER — Ambulatory Visit (INDEPENDENT_AMBULATORY_CARE_PROVIDER_SITE_OTHER): Payer: BC Managed Care – PPO | Admitting: Family Medicine

## 2014-01-07 VITALS — BP 136/82 | Ht 62.0 in | Wt 198.0 lb

## 2014-01-07 DIAGNOSIS — R6884 Jaw pain: Secondary | ICD-10-CM

## 2014-01-07 MED ORDER — PREDNISONE 20 MG PO TABS: ORAL_TABLET | ORAL | Status: AC

## 2014-01-07 MED ORDER — FEBUXOSTAT 40 MG PO TABS: 80.0000 mg | ORAL_TABLET | Freq: Every day | ORAL | Status: DC

## 2014-01-07 MED ORDER — DOXAZOSIN MESYLATE 2 MG PO TABS: 2.0000 mg | ORAL_TABLET | Freq: Every day | ORAL | Status: DC

## 2014-01-07 NOTE — Progress Notes (Signed)
   Subjective:    Patient ID: Joanna [name], female    DOB: 1951-10-01, 63 y.o.   MRN: [id number]  HPI  Patient states she woke up Sunday morning with left side ear and jaw pain- ear is popping and face seems swollen in jaw area. Tender when she chews denies any injury denies head congestion drainage coughing. She has history of sinus but she really doesn't feel it's her sinus. 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 Due to patient's kidneys cannot due to NSAIDs. Followup in April as previously planned. Followup sooner if any issues. Warnings discussed. Minimize chewing for the next several days. May need oral specialist if ongoing troubles

## 2014-02-02 ENCOUNTER — Encounter: Payer: Self-pay | Admitting: Family Medicine

## 2014-02-02 ENCOUNTER — Ambulatory Visit (INDEPENDENT_AMBULATORY_CARE_PROVIDER_SITE_OTHER): Payer: BC Managed Care – PPO | Admitting: Family Medicine

## 2014-02-02 VITALS — BP 130/82 | Ht 62.0 in | Wt 199.4 lb

## 2014-02-02 DIAGNOSIS — Z23 Encounter for immunization: Secondary | ICD-10-CM

## 2014-02-02 DIAGNOSIS — I10 Essential (primary) hypertension: Secondary | ICD-10-CM

## 2014-02-02 DIAGNOSIS — E119 Type 2 diabetes mellitus without complications: Secondary | ICD-10-CM

## 2014-02-02 LAB — POCT GLYCOSYLATED HEMOGLOBIN (HGB A1C): Hemoglobin A1C: 7.3

## 2014-02-02 MED ORDER — COLCHICINE 0.6 MG PO TABS: 0.6000 mg | ORAL_TABLET | Freq: Two times a day (BID) | ORAL | Status: DC

## 2014-02-02 MED ORDER — DULOXETINE HCL 30 MG PO CPEP: 30.0000 mg | ORAL_CAPSULE | Freq: Every day | ORAL | Status: DC

## 2014-02-02 MED ORDER — HYDROCODONE-ACETAMINOPHEN 5-325 MG PO TABS: 1.0000 | ORAL_TABLET | Freq: Four times a day (QID) | ORAL | Status: DC | PRN

## 2014-02-02 NOTE — Progress Notes (Signed)
   Subjective:    Patient ID: Joanna [name], female    DOB: [birth year], 63 y.o.   MRN: [id number]  Diabetes She presents for her follow-up diabetic visit. She has type 2 diabetes mellitus. Current diabetic treatment includes oral agent (monotherapy). She is compliant with treatment all of the time. She is following a diabetic diet.  Patient reports trouble sleeping and crying spells lately. Patient states some loss of interest. She also has chronic pain which frustrates her. She denies being suicidal.    Review of Systems    negative chest pain nausea vomiting shortness breath vomiting diarrhea fever chills sweats Objective:   Physical Exam  Lungs clear heart regular pulse normal blood pressure good extremities no edema      Assessment & Plan:  #1 diabetes-fair control. Exercise watch the starches try to bring weight down  #2 chronic pain hydrocodone as prescribed she uses anywhere between one or 2 per day some days none 30 tablets usually last her for while we gave her 14 for today she will call back when she needs a new prescription  #3 cholesterol continue watch diet and take medication  #4 gout-changed to generic colchicine

## 2014-03-25 LAB — HM DIABETES EYE EXAM

## 2014-03-31 ENCOUNTER — Other Ambulatory Visit: Payer: Self-pay | Admitting: *Deleted

## 2014-03-31 MED ORDER — DULOXETINE HCL 30 MG PO CPEP: 30.0000 mg | ORAL_CAPSULE | Freq: Every day | ORAL | Status: DC

## 2014-04-01 ENCOUNTER — Telehealth: Payer: Self-pay | Admitting: Family Medicine

## 2014-04-01 MED ORDER — DULOXETINE HCL 30 MG PO CPEP: 30.0000 mg | ORAL_CAPSULE | Freq: Every day | ORAL | Status: DC

## 2014-04-01 NOTE — Telephone Encounter (Signed)
Patient needs Rx for duloxetine HCL DR 30 mg caps

## 2014-04-03 DIAGNOSIS — Z0289 Encounter for other administrative examinations: Secondary | ICD-10-CM

## 2014-04-07 ENCOUNTER — Encounter: Payer: Self-pay | Admitting: Family Medicine

## 2014-04-07 ENCOUNTER — Ambulatory Visit (INDEPENDENT_AMBULATORY_CARE_PROVIDER_SITE_OTHER): Payer: BC Managed Care – PPO | Admitting: Family Medicine

## 2014-04-07 VITALS — BP 130/84 | Ht 62.0 in | Wt 195.6 lb

## 2014-04-07 DIAGNOSIS — M6530 Trigger finger, unspecified finger: Secondary | ICD-10-CM

## 2014-04-07 DIAGNOSIS — I10 Essential (primary) hypertension: Secondary | ICD-10-CM

## 2014-04-07 DIAGNOSIS — D509 Iron deficiency anemia, unspecified: Secondary | ICD-10-CM

## 2014-04-07 DIAGNOSIS — E785 Hyperlipidemia, unspecified: Secondary | ICD-10-CM

## 2014-04-07 DIAGNOSIS — N289 Disorder of kidney and ureter, unspecified: Secondary | ICD-10-CM

## 2014-04-07 DIAGNOSIS — E119 Type 2 diabetes mellitus without complications: Secondary | ICD-10-CM

## 2014-04-07 DIAGNOSIS — E031 Congenital hypothyroidism without goiter: Secondary | ICD-10-CM

## 2014-04-07 DIAGNOSIS — J3489 Other specified disorders of nose and nasal sinuses: Secondary | ICD-10-CM

## 2014-04-07 MED ORDER — HYDROCODONE-ACETAMINOPHEN 7.5-325 MG PO TABS: 1.0000 | ORAL_TABLET | Freq: Four times a day (QID) | ORAL | Status: DC | PRN

## 2014-04-07 NOTE — Progress Notes (Signed)
   Subjective:    Patient ID: Joanna [name], female    DOB: 14-Sep-1951, 63 y.o.   MRN: [id number]  HPI Patient arrives with complaint of headaches off and on for 3 weeks- currently taking Tylenol and Hydrocodone without relief.some nausea Had migraines years ago Also with sinus pain  Most headahes across the top of frontal sinus She denies vomiting diarrhea sweats chills.  She does need her lab work completed. She also relates that she's eating a lot of ice and craves ice  Patient also complaining of knot on right thumb for 2 weeks. She also has trigger finger has difficult time opening the ring finger on the right hand. She denies rectal bleeding Review of Systems  Constitutional: Negative for fever and activity change.  HENT: Positive for congestion and rhinorrhea. Negative for ear pain.   Eyes: Negative for discharge.  Respiratory: Positive for cough. Negative for shortness of breath and wheezing.   Cardiovascular: Negative for chest pain.       Objective:   Physical Exam  Vitals reviewed. Constitutional: She appears well-nourished. No distress.  HENT:  Head: Normocephalic.  Right Ear: External ear normal.  Left Ear: External ear normal.  Mouth/Throat: No oropharyngeal exudate.  Cardiovascular: Normal rate, regular rhythm and normal heart sounds.   No murmur heard. Pulmonary/Chest: Effort normal and breath sounds normal. No respiratory distress.  Musculoskeletal: She exhibits no edema.  Lymphadenopathy:    She has no cervical adenopathy.  Neurological: She is alert. She exhibits normal muscle tone.  Psychiatric: Her behavior is normal.          Assessment & Plan:  Sinus-related headaches I believe this patient would be best served by going ahead and being seen by ear nose throat specialist. I believe that she will need to have fiberoptic evaluation with possible dilation of the sinuses she would benefit from having this done this summer.  Trigger finger in the hand  she will benefit from seeing orthopedics specialist she also has ganglionic cyst  HTN good control  Hyperlipidemia check lab work  Hyperglycemia check lab work she ought to keep her followup visit later this summer

## 2014-04-27 ENCOUNTER — Encounter: Payer: Self-pay | Admitting: Family Medicine

## 2014-04-29 LAB — LIPID PANEL
Cholesterol: 133 mg/dL (ref 0–200)
HDL: 47 mg/dL (ref >39)
LDL Cholesterol: 57 mg/dL (ref 0–99)
Total CHOL/HDL Ratio: 2.8 Ratio
Triglycerides: 145 mg/dL (ref <150)
VLDL: 29 mg/dL (ref 0–40)

## 2014-04-29 LAB — CBC WITH DIFFERENTIAL/PLATELET
Basophils Absolute: 0.1 10*3/uL (ref 0.0–0.1)
Basophils Relative: 1 % (ref 0–1)
Eosinophils Absolute: 0.2 10*3/uL (ref 0.0–0.7)
Eosinophils Relative: 3 % (ref 0–5)
HCT: 28.1 % — ABNORMAL LOW (ref 36.0–46.0)
Hemoglobin: 8.8 g/dL — ABNORMAL LOW (ref 12.0–15.0)
Lymphocytes Relative: 54 % — ABNORMAL HIGH (ref 12–46)
Lymphs Abs: 3.2 10*3/uL (ref 0.7–4.0)
MCH: 22.3 pg — ABNORMAL LOW (ref 26.0–34.0)
MCHC: 31.3 g/dL (ref 30.0–36.0)
MCV: 71.3 fL — ABNORMAL LOW (ref 78.0–100.0)
Monocytes Absolute: 0.4 10*3/uL (ref 0.1–1.0)
Monocytes Relative: 7 % (ref 3–12)
Neutro Abs: 2.1 10*3/uL (ref 1.7–7.7)
Neutrophils Relative %: 35 % — ABNORMAL LOW (ref 43–77)
Platelets: 371 10*3/uL (ref 150–400)
RBC: 3.94 MIL/uL (ref 3.87–5.11)
RDW: 18.7 % — ABNORMAL HIGH (ref 11.5–15.5)
WBC: 5.9 10*3/uL (ref 4.0–10.5)

## 2014-04-29 LAB — BASIC METABOLIC PANEL
BUN: 22 mg/dL (ref 6–23)
CO2: 26 mEq/L (ref 19–32)
Calcium: 9.8 mg/dL (ref 8.4–10.5)
Chloride: 106 mEq/L (ref 96–112)
Creat: 1.6 mg/dL — ABNORMAL HIGH (ref 0.50–1.10)
Glucose, Bld: 98 mg/dL (ref 70–99)
Potassium: 4 mEq/L (ref 3.5–5.3)
Sodium: 142 mEq/L (ref 135–145)

## 2014-04-29 LAB — TSH: TSH: 0.086 u[IU]/mL — ABNORMAL LOW (ref 0.350–4.500)

## 2014-04-29 LAB — HEPATIC FUNCTION PANEL
ALT: 19 U/L (ref 0–35)
AST: 22 U/L (ref 0–37)
Albumin: 3.9 g/dL (ref 3.5–5.2)
Alkaline Phosphatase: 86 U/L (ref 39–117)
Bilirubin, Direct: 0.1 mg/dL (ref 0.0–0.3)
Indirect Bilirubin: 0.2 mg/dL (ref 0.2–1.2)
Total Bilirubin: 0.3 mg/dL (ref 0.2–1.2)
Total Protein: 6.2 g/dL (ref 6.0–8.3)

## 2014-04-29 LAB — FERRITIN: Ferritin: 10 ng/mL (ref 10–291)

## 2014-04-29 LAB — HEMOGLOBIN A1C
Hgb A1c MFr Bld: 7.2 % — ABNORMAL HIGH (ref <5.7)
Mean Plasma Glucose: 160 mg/dL — ABNORMAL HIGH (ref <117)

## 2014-05-04 ENCOUNTER — Encounter: Payer: Self-pay | Admitting: Family Medicine

## 2014-05-04 ENCOUNTER — Ambulatory Visit (INDEPENDENT_AMBULATORY_CARE_PROVIDER_SITE_OTHER): Payer: BC Managed Care – PPO | Admitting: Family Medicine

## 2014-05-04 VITALS — BP 122/84 | Ht 62.0 in | Wt 194.2 lb

## 2014-05-04 DIAGNOSIS — E038 Other specified hypothyroidism: Secondary | ICD-10-CM

## 2014-05-04 DIAGNOSIS — D649 Anemia, unspecified: Secondary | ICD-10-CM

## 2014-05-04 DIAGNOSIS — E119 Type 2 diabetes mellitus without complications: Secondary | ICD-10-CM

## 2014-05-04 DIAGNOSIS — I10 Essential (primary) hypertension: Secondary | ICD-10-CM

## 2014-05-04 DIAGNOSIS — G4700 Insomnia, unspecified: Secondary | ICD-10-CM

## 2014-05-04 LAB — IRON AND TIBC
%SAT: 8 % — ABNORMAL LOW (ref 20–55)
Iron: 34 ug/dL — ABNORMAL LOW (ref 42–145)
TIBC: 442 ug/dL (ref 250–470)
UIBC: 408 ug/dL — ABNORMAL HIGH (ref 125–400)

## 2014-05-04 MED ORDER — GLIPIZIDE 5 MG PO TABS: 5.0000 mg | ORAL_TABLET | Freq: Two times a day (BID) | ORAL | Status: DC

## 2014-05-04 MED ORDER — TRAZODONE HCL 100 MG PO TABS: ORAL_TABLET | ORAL | Status: DC

## 2014-05-04 MED ORDER — LOSARTAN POTASSIUM 100 MG PO TABS: 100.0000 mg | ORAL_TABLET | Freq: Every day | ORAL | Status: DC

## 2014-05-04 MED ORDER — OMEPRAZOLE 40 MG PO CPDR: DELAYED_RELEASE_CAPSULE | ORAL | Status: DC

## 2014-05-04 MED ORDER — PRAVASTATIN SODIUM 20 MG PO TABS: ORAL_TABLET | ORAL | Status: DC

## 2014-05-04 NOTE — Addendum Note (Signed)
Addended by: [name] A on: 05/04/2014 09:12 PM   Modules accepted: Orders, Medications

## 2014-05-04 NOTE — Progress Notes (Signed)
   Subjective:    Patient ID: Joanna [name], female    DOB: Jun 19, 1951, 63 y.o.   MRN: [id number]  Diabetes She presents for her follow-up diabetic visit. She has type 2 diabetes mellitus. Pertinent negatives for hypoglycemia include no confusion. Pertinent negatives for diabetes include no chest pain, no fatigue, no polydipsia, no polyphagia and no weakness. Risk factors for coronary artery disease include diabetes mellitus, dyslipidemia and hypertension. Current diabetic treatment includes oral agent (monotherapy).   Discuss blood work results. She tries to do that she can't watching her diet she relates she takes her medications she does not exercise ways that she should her physical disabilities prevent her from doing so. Her lab work was reviewed showing what probable arm deficient anemia Dr. Oneida Alar as her gastroenterologist. She has had a colonoscopy but not EGD Her thyroid testing shows it needs adjusting renal function shows stability yet renal insufficiency Cholesterol looks good Liver functions look good A1c acceptable patient should try to improve diet and activity continue medications Review of Systems  Constitutional: Negative for activity change, appetite change and fatigue.  HENT: Negative for congestion.   Respiratory: Negative for cough and shortness of breath.   Cardiovascular: Negative for chest pain.  Gastrointestinal: Negative for abdominal pain.  Endocrine: Negative for polydipsia and polyphagia.  Genitourinary: Negative for frequency.  Neurological: Negative for weakness.  Psychiatric/Behavioral: Negative for confusion.       Objective:   Physical Exam  Vitals reviewed. Constitutional: She appears well-nourished. No distress.  Cardiovascular: Normal rate, regular rhythm and normal heart sounds.   No murmur heard. Pulmonary/Chest: Effort normal and breath sounds normal. No respiratory distress.  Musculoskeletal: She exhibits no edema.  Lymphadenopathy:    She  has no cervical adenopathy.  Neurological: She is alert. She exhibits normal muscle tone.  Psychiatric: Her behavior is normal.          Assessment & Plan:  htn stable  Iron def anemia- check heme cards x3, referral back to gastroenterology  Renal insuff-stable continue current measures  hypothyr-overtreatment reduce the medication to a half tablet on Monday one on all other days  hyperlip-continue current measures LDL at goal  Chronic back pain- meds, exercises prescriptions given  Insomnia- trazadone hopefully this will help. Could not perform Cymbalta.  Gout- can't afford Uloric , diet recommended.  The patient followup in 3-4 months

## 2014-05-06 MED ORDER — GLIPIZIDE 5 MG PO TABS: 5.0000 mg | ORAL_TABLET | Freq: Two times a day (BID) | ORAL | Status: DC

## 2014-05-06 NOTE — Addendum Note (Signed)
Addended byCharolotte Capuchin D on: 05/06/2014 01:57 PM   Modules accepted: Orders

## 2014-05-06 NOTE — Addendum Note (Signed)
Addended byCharolotte Capuchin D on: 05/06/2014 01:46 PM   Modules accepted: Orders

## 2014-05-07 MED ORDER — LEVOTHYROXINE SODIUM 88 MCG PO TABS: 88.0000 ug | ORAL_TABLET | Freq: Every day | ORAL | Status: DC

## 2014-05-07 NOTE — Progress Notes (Signed)
Patient notified and verbalized understanding. 

## 2014-05-07 NOTE — Addendum Note (Signed)
Addended byCharolotte Capuchin D on: 05/07/2014 08:44 AM   Modules accepted: Orders, Medications

## 2014-05-15 ENCOUNTER — Encounter: Payer: Self-pay | Admitting: Gastroenterology

## 2014-05-16 ENCOUNTER — Other Ambulatory Visit: Payer: Self-pay | Admitting: Family Medicine

## 2014-05-29 DIAGNOSIS — Z0289 Encounter for other administrative examinations: Secondary | ICD-10-CM

## 2014-06-10 LAB — POC HEMOCCULT BLD/STL (HOME/3-CARD/SCREEN)
Card #2 Fecal Occult Blod, POC: NEGATIVE
Card #3 Fecal Occult Blood, POC: NEGATIVE
Fecal Occult Blood, POC: NEGATIVE

## 2014-06-17 ENCOUNTER — Ambulatory Visit (INDEPENDENT_AMBULATORY_CARE_PROVIDER_SITE_OTHER): Payer: 59 | Admitting: Gastroenterology

## 2014-06-17 ENCOUNTER — Encounter: Payer: Self-pay | Admitting: Gastroenterology

## 2014-06-17 ENCOUNTER — Other Ambulatory Visit: Payer: Self-pay | Admitting: Gastroenterology

## 2014-06-17 ENCOUNTER — Encounter (INDEPENDENT_AMBULATORY_CARE_PROVIDER_SITE_OTHER): Payer: Self-pay

## 2014-06-17 ENCOUNTER — Other Ambulatory Visit: Payer: Self-pay

## 2014-06-17 VITALS — BP 128/85 | HR 111 | Temp 98.5°F | Ht 62.0 in | Wt 193.4 lb

## 2014-06-17 DIAGNOSIS — K625 Hemorrhage of anus and rectum: Secondary | ICD-10-CM

## 2014-06-17 DIAGNOSIS — D509 Iron deficiency anemia, unspecified: Secondary | ICD-10-CM | POA: Insufficient documentation

## 2014-06-17 NOTE — Progress Notes (Signed)
PATIENT NIC'D FOR 4 MONTH FU

## 2014-06-17 NOTE — Assessment & Plan Note (Signed)
[facility] BRBPR-LAST TCS/EGD 2013-HYPERPLASTIC POLYPS/GASTRITIS  TCS/EGD SEP 18-MOVIPREP MAY HOLD ASA HOLD GLIPIZIDE ON PM PRIRO TO AND AM OF ENDOSCOPY HOLD INDAPAMIDE ON DAY BEFORE AND DAY OF ENDO FOLLOW UP IN 4 MOS.

## 2014-06-17 NOTE — Progress Notes (Signed)
Subjective:    Patient ID: Joanna [name], female    DOB: [birth year], 63 y.o.   MRN: [id number]  Joanna Lange, MD  HPI Reports BRBPR IN JUL 2015-LASTED ONE DAY JUST WHEN SHE WIPED. NO RECTAL PAIN OR PRESSURE. OCCASIONAL RECTAL ITCHING DURING THAT TIME USED PREP H AND THINGS GOT BETTER. Marland Kitchen HAD TCS 2 YRS AGO.NL Hb 2008 AND THEN NOT CHECKED AGAIN UNTIL 2015 HB 8.8 WITH MCV 71.4. WEIGHT UP SINCE 2014: 188 TO 193.4 LBS. BMs: DAILY(#5). REMEMBERED TRYING WAKE UP DURING THE ENDO. APPETITE: SO/SO. FEELING STRESSED. RETIRED FROM [employer]. HAD TO DUE TO BACK PROBLEMS.  PT DENIES FEVER, CHILLS, HEMATEMESIS, nausea, vomiting, melena, diarrhea, CHEST PAIN, SHORTNESS OF BREATH,  CHANGE IN BOWEL IN HABITS, constipation, abdominal pain, problems swallowing, OR heartburn or indigestion.  Past Medical History  Diagnosis Date  . GERD (gastroesophageal reflux disease)   . Diabetes mellitus   . Hypertension   . Depression   . Insomnia   . Diverticulosis   . Thyroid disease     hypothyroid  . Sleep apnea   . Dyslipidemia   . Microproteinuria   . Renal insufficiency 2010   Past Surgical History  Procedure Laterality Date  . Total abdominal hysterectomy w/ bilateral salpingoophorectomy    . Knee arthroscopy Right   . Esophagogastroduodenoscopy   01/29/2012    Joanna [name] sliding HH, fundic gland polyps, NEGATIVE H pylori  . Colonoscopy  01/29/12    Joanna [name] hyperplastic polyps, mod internal hemorrhoids, diverticulosis throughout colon  . Back surgery    . Abdominal hysterectomy  1984  . Bilateral oophorectomy     Allergies  Allergen Reactions  . Ivp Dye [Iodinated Diagnostic Agents]     Current Outpatient Prescriptions  Medication Sig Dispense Refill  . aspirin 81 MG tablet Take 81 mg by mouth daily. NOT TAKING   . doxazosin (CARDURA) 2 MG tablet Take 1 tablet (2 mg total) by mouth daily.    . fexofenadine (ALLEGRA) 180 MG tablet Take 180 mg by mouth daily. PRN   . glipiZIDE (GLUCOTROL) 5  MG tablet  TAKE 1 TABLET AM AND 1 TABLET IN THE EVENING    . indapamide (LOZOL) 2.5 MG tablet Take 1 tablet (2.5 mg total) by mouth daily.    Marland Kitchen levothyroxine (SYNTHROID, LEVOTHROID) 88 MCG tablet Take 1 tablet (88 mcg total) by mouth daily.    Marland Kitchen losartan (COZAAR) 100 MG tablet TAKE 1 TABLET BY MOUTH EVERY DAY ~3 YEARS   . omeprazole (PRILOSEC) 40 MG capsule TAKE 1 CAPSULE EVERY DAY    . pravastatin (PRAVACHOL) 20 MG tablet TAKE 1 TABLET EVERY DAY    . ALPRAZolam (XANAX) 0.5 MG tablet 1/2 to 1 po BID prn anxiety    . colchicine 0.6 MG tablet Take 1 tablet (0.6 mg total) by mouth 2 (two) times daily. Patient wants to switch to generic PRN   . cyclobenzaprine (FLEXERIL) 5 MG tablet     . fluticasone (FLONASE) 50 MCG/ACT nasal spray Place 2 sprays into the nose as needed. PRN   .      Marland Kitchen HYDROcodone-acetaminophen (NORCO) 7.5-325 MG per tablet Take 1 tablet by mouth every 6 (six) hours as needed. PRN   . Magnesium 250 MG TABS Take by mouth daily. PRN   . traZODone (DESYREL) 100 MG tablet 0.5 to one tablet at bedtime PRN   . triamcinolone cream (KENALOG) 0.1 % Apply topically 2 (two) times daily.     Family History  Problem Relation Age  of Onset  . Cardiomyopathy Father   . Heart failure Mother   . Hyperlipidemia Mother   . Stroke Brother   . Cancer Brother     renal  . Kidney cancer Brother   . Diabetes Paternal Uncle   . Heart disease Paternal Grandmother     History   Social History Narrative  . No narrative on file     Review of Systems PER HPI OTHERWISE ALL SYSTEMS ARE NEGATIVE.     Objective:   Physical Exam  Vitals reviewed. Constitutional: She is oriented to person, place, and time. She appears well-developed and well-nourished. No distress.  HENT:  Head: Normocephalic and atraumatic.  Mouth/Throat: Oropharynx is clear and moist. No oropharyngeal exudate.  Eyes: Pupils are equal, round, and reactive to light. No scleral icterus.  Neck: Normal range of motion. Neck  supple.  Cardiovascular: Normal rate, regular rhythm and normal heart sounds.   Pulmonary/Chest: Effort normal and breath sounds normal. No respiratory distress.  Abdominal: Soft. Bowel sounds are normal. She exhibits no distension. There is no tenderness.  Musculoskeletal: She exhibits no edema.  Lymphadenopathy:    She has no cervical adenopathy.  Neurological: She is alert and oriented to person, place, and time.  NO  NEW FOCAL DEFICITS   Psychiatric: She has a normal mood and affect.          Assessment & Plan:

## 2014-06-17 NOTE — Progress Notes (Signed)
Cc to pcp °

## 2014-06-17 NOTE — Patient Instructions (Signed)
HOLD ASPIRIN.   ON SEP 17-FULL LIQUID DIET. SEE INFO BELOW.  HOLD GLIPIZIDE AND INDAPAMINE.   ON SEP 18: HOLD GLIPIZIDE AND INDAPAMINE. UPPER AND LOWER ENDOSCOPY ON SEP 18.   FOLLOW UP IN 4 MOS.    Full Liquid Diet A high-calorie, high-protein supplement should be used to meet your nutritional requirements when the full liquid diet is continued for more than 2 or 3 days. If this diet is to be used for an extended period of time (more than 7 days), a multivitamin should be considered.  Breads and Starches  Allowed: None are allowed except crackers WHOLE OR pureed (made into a thick, smooth soup) in soup.   Avoid: Any others.    Potatoes/Pasta/Rice  Allowed: ANY ITEM AS A SOUP OR SMALL PLATE OF MASHED POTATOES.       Vegetables  Allowed: Strained tomato or vegetable juice. Vegetables pureed in soup.   Avoid: Any others.    Fruit  Allowed: Any strained fruit juices and fruit drinks. Include 1 serving of citrus or vitamin C-enriched fruit juice daily.   Avoid: Any others.  Meat and Meat Substitutes  Allowed: Egg  Avoid: Any meat, fish, or fowl. All cheese.  Milk  Allowed: Milk beverages, including milk shakes and instant breakfast mixes. Smooth yogurt.   Avoid: Any others. Avoid dairy products if not tolerated.    Soups and Combination Foods  Allowed: Broth, strained cream soups. Strained, broth-based soups.   Avoid: Any others.    Desserts and Sweets  Allowed: flavored gelatin, tapioca, plain ice cream, sherbet, smooth pudding, junket, fruit ices, frozen ice pops, pudding pops,, frozen fudge pops, chocolate syrup. Sugar, honey, jelly, syrup.   Avoid: Any others.  Fats and Oils  Allowed: Margarine, butter, cream, sour cream, oils.   Avoid: Any others.  Beverages  Allowed: All.   Avoid: None.  Condiments  Allowed: Iodized salt, pepper, spices, flavorings. Cocoa powder.   Avoid: Any others.    SAMPLE MEAL PLAN Breakfast   cup orange juice.    1 cup cooked wheat cereal.   1 cup  milk.   1 cup beverage (coffee or tea).   Cream or sugar, if desired.    Midmorning Snack  2 SCRAMBLED OR HARD BOILED EGG   Lunch  1 cup cream soup.    cup fruit juice.   1 cup milk.    cup custard.   1 cup beverage (coffee or tea).   Cream or sugar, if desired.    Midafternoon Snack  1 cup milk shake.  Dinner  1 cup cream soup.    cup fruit juice.   1 cup milk.    cup pudding.   1 cup beverage (coffee or tea).   Cream or sugar, if desired.  Evening Snack  1 cup supplement.  To increase calories, add sugar, cream, butter, or margarine if possible. Nutritional supplements will also increase the total calories.

## 2014-06-23 ENCOUNTER — Encounter (HOSPITAL_COMMUNITY): Payer: Self-pay | Admitting: Pharmacy Technician

## 2014-06-30 ENCOUNTER — Telehealth: Payer: Self-pay | Admitting: Gastroenterology

## 2014-06-30 ENCOUNTER — Other Ambulatory Visit: Payer: Self-pay

## 2014-06-30 MED ORDER — PEG 3350-KCL-NA BICARB-NACL 420 G PO SOLR: 4000.0000 mL | ORAL | Status: DC

## 2014-06-30 NOTE — Telephone Encounter (Signed)
Pt called this afternoon to let us know that her TCS/EGD with [facility] is this Friday and she said that her pharmacy said that they haven't received a rx for her prep. She uses [facility] in [place].

## 2014-06-30 NOTE — Telephone Encounter (Signed)
Sent prep to pharmacy and patient is aware

## 2014-07-03 ENCOUNTER — Encounter (HOSPITAL_COMMUNITY): Payer: Self-pay | Admitting: *Deleted

## 2014-07-03 ENCOUNTER — Ambulatory Visit (HOSPITAL_COMMUNITY)
Admission: RE | Admit: 2014-07-03 | Discharge: 2014-07-03 | Disposition: A | Discharge status: Home / Self Care | Payer: 59 | Source: Ambulatory Visit | Attending: Gastroenterology | Admitting: Gastroenterology

## 2014-07-03 ENCOUNTER — Encounter (HOSPITAL_COMMUNITY)
Admission: RE | Disposition: A | Discharge status: Home / Self Care | Payer: Self-pay | Source: Ambulatory Visit | Attending: Gastroenterology

## 2014-07-03 DIAGNOSIS — K621 Rectal polyp: Secondary | ICD-10-CM | POA: Diagnosis not present

## 2014-07-03 DIAGNOSIS — Z7982 Long term (current) use of aspirin: Secondary | ICD-10-CM | POA: Diagnosis not present

## 2014-07-03 DIAGNOSIS — K2970 Gastritis, unspecified, without bleeding: Secondary | ICD-10-CM | POA: Diagnosis not present

## 2014-07-03 DIAGNOSIS — K648 Other hemorrhoids: Secondary | ICD-10-CM | POA: Diagnosis not present

## 2014-07-03 DIAGNOSIS — I10 Essential (primary) hypertension: Secondary | ICD-10-CM | POA: Diagnosis not present

## 2014-07-03 DIAGNOSIS — Z79899 Other long term (current) drug therapy: Secondary | ICD-10-CM | POA: Diagnosis not present

## 2014-07-03 DIAGNOSIS — K625 Hemorrhage of anus and rectum: Secondary | ICD-10-CM

## 2014-07-03 DIAGNOSIS — Q438 Other specified congenital malformations of intestine: Secondary | ICD-10-CM | POA: Diagnosis not present

## 2014-07-03 DIAGNOSIS — D509 Iron deficiency anemia, unspecified: Secondary | ICD-10-CM

## 2014-07-03 DIAGNOSIS — F329 Major depressive disorder, single episode, unspecified: Secondary | ICD-10-CM | POA: Insufficient documentation

## 2014-07-03 DIAGNOSIS — N289 Disorder of kidney and ureter, unspecified: Secondary | ICD-10-CM | POA: Diagnosis not present

## 2014-07-03 DIAGNOSIS — K2990 Gastroduodenitis, unspecified, without bleeding: Secondary | ICD-10-CM

## 2014-07-03 DIAGNOSIS — E785 Hyperlipidemia, unspecified: Secondary | ICD-10-CM | POA: Diagnosis not present

## 2014-07-03 DIAGNOSIS — K620 Anal polyp: Secondary | ICD-10-CM | POA: Diagnosis not present

## 2014-07-03 DIAGNOSIS — F3289 Other specified depressive episodes: Secondary | ICD-10-CM | POA: Diagnosis not present

## 2014-07-03 DIAGNOSIS — E039 Hypothyroidism, unspecified: Secondary | ICD-10-CM | POA: Insufficient documentation

## 2014-07-03 DIAGNOSIS — Z90710 Acquired absence of both cervix and uterus: Secondary | ICD-10-CM | POA: Diagnosis not present

## 2014-07-03 DIAGNOSIS — D126 Benign neoplasm of colon, unspecified: Secondary | ICD-10-CM

## 2014-07-03 DIAGNOSIS — E119 Type 2 diabetes mellitus without complications: Secondary | ICD-10-CM | POA: Insufficient documentation

## 2014-07-03 HISTORY — PX: ESOPHAGOGASTRODUODENOSCOPY: SHX5428

## 2014-07-03 HISTORY — PX: COLONOSCOPY: SHX5424

## 2014-07-03 LAB — GLUCOSE, CAPILLARY: Glucose-Capillary: 140 mg/dL — ABNORMAL HIGH (ref 70–99)

## 2014-07-03 SURGERY — COLONOSCOPY
Anesthesia: Moderate Sedation

## 2014-07-03 MED ORDER — PROMETHAZINE HCL 25 MG/ML IJ SOLN
12.5000 mg | Freq: Once | INTRAMUSCULAR | Status: AC
  Administered 2014-07-03: 12.5 mg via INTRAVENOUS

## 2014-07-03 MED ORDER — MEPERIDINE HCL 100 MG/ML IJ SOLN
INTRAMUSCULAR | Status: DC | PRN
  Administered 2014-07-03 (×3): 25 mg via INTRAVENOUS

## 2014-07-03 MED ORDER — LIDOCAINE VISCOUS 2 % MT SOLN
OROMUCOSAL | Status: DC | PRN
  Administered 2014-07-03: 1 via OROMUCOSAL

## 2014-07-03 MED ORDER — MEPERIDINE HCL 100 MG/ML IJ SOLN
INTRAMUSCULAR | Status: DC
Start: 2014-07-03 — End: 2014-07-03
  Filled 2014-07-03: qty 2

## 2014-07-03 MED ORDER — LIDOCAINE VISCOUS 2 % MT SOLN
OROMUCOSAL | Status: AC
  Filled 2014-07-03: qty 15

## 2014-07-03 MED ORDER — SIMETHICONE 40 MG/0.6ML PO SUSP
ORAL | Status: DC | PRN
  Administered 2014-07-03: 09:00:00

## 2014-07-03 MED ORDER — PROMETHAZINE HCL 25 MG/ML IJ SOLN
INTRAMUSCULAR | Status: AC
  Filled 2014-07-03: qty 1

## 2014-07-03 MED ORDER — SODIUM CHLORIDE 0.9 % IJ SOLN
INTRAMUSCULAR | Status: AC
  Filled 2014-07-03: qty 10

## 2014-07-03 MED ORDER — MIDAZOLAM HCL 5 MG/5ML IJ SOLN
INTRAMUSCULAR | Status: DC | PRN
  Administered 2014-07-03 (×2): 1 mg via INTRAVENOUS
  Administered 2014-07-03: 2 mg via INTRAVENOUS
  Administered 2014-07-03: 1 mg via INTRAVENOUS

## 2014-07-03 MED ORDER — MIDAZOLAM HCL 5 MG/5ML IJ SOLN
INTRAMUSCULAR | Status: AC
  Filled 2014-07-03: qty 10

## 2014-07-03 MED ORDER — SODIUM CHLORIDE 0.9 % IV SOLN
INTRAVENOUS | Status: DC
  Administered 2014-07-03: 08:00:00 via INTRAVENOUS

## 2014-07-03 NOTE — Op Note (Signed)
Joanna [name], [id number]   COLONOSCOPY PROCEDURE REPORT  PATIENT: Joanna [name], Joanna [name]  MR#: [id number] BIRTHDATE: 11-07-1950 , 63  yrs. old GENDER: Female ENDOSCOPIST: Barney Drain, MD REFERRED [id number] Wolfgang Phoenix, M.D. PROCEDURE DATE:  07/03/2014 PROCEDURE:   Colonoscopy with cold biopsy polypectomy INDICATIONS:microcytic anemia-JUL 2015 Hb 8.8 MCV 71, FERRITIN 10, Cr 1.60.  LAST EGD/TCS  APR 2013: HYPERPLASTIC POLYPS/IH MEDICATIONS: PREOP: Promethazine (Phenergan) 12.5mg  IV, Demerol 50 mg IV, and Versed 3 mg IV  DESCRIPTION OF PROCEDURE:    Physical exam was performed.  Informed consent was obtained from the patient after explaining the benefits, risks, and alternatives to procedure.  The patient was connected to monitor and placed in left lateral position. Continuous oxygen was provided by nasal cannula and IV medicine administered through an indwelling cannula.  After administration of sedation and rectal exam, the patients rectum was intubated and the [id number] and [id number])  colonoscope was advanced under direct visualization to the ileum.  The scope was removed slowly by carefully examining the color, texture, anatomy, and integrity mucosa on the way out.  The patient was recovered in endoscopy and discharged home in satisfactory condition.    COLON FINDINGS: The mucosa appeared normal in the terminal ileum.  , There was severe diverticulosis noted throughout the entire examined colon with associated angulation, tortuosity and muscular hypertrophy.  , The LEFT colon IS redundant.  Manual abdominal counter-pressure was used to reach the cecum, Small internal hemorrhoids were found.  , and Three polyps measuring 2-3 mm in size were found in the rectum.  A polypectomy was performed with cold forceps.  PREP QUALITY: good.  CECAL W/D TIME: 18 minutes     COMPLICATIONS: None  ENDOSCOPIC IMPRESSION: 1.   Normal mucosa in the  terminal ileum 2.   Severe diverticulosis hroughout the entire examined colon 3.   The LEFT COLON colon IS redundant 4.   Small internal hemorrhoids 5.   Three COLON polyps REMOVED  RECOMMENDATIONS: Use OMEPRAZOLE 30 minutes prior to your first meal. AVOID ITEMS THAT TRIGGER GASTRITIS. FOLLOW A HIGH FIBER/LOW FAT DIET.  AVOID ITEMS THAT CAUSE BLOATING.  BIOPSY RESULTS WILL BE BACK IN 14 DAYS.  Next colonoscopy in 5-10 years WITH AN OVERTUBE.     _______________________________ Lorrin MaisBarney Drain, MD 07/03/2014 2:09 PM

## 2014-07-03 NOTE — Op Note (Addendum)
[name], [id number]   ENDOSCOPY PROCEDURE REPORT  PATIENT: Joanna [name], Joanna [name]  MR#: [id number] BIRTHDATE: [birth year] , 63  yrs. old GENDER: Female  ENDOSCOPIST: Barney Drain, MD REFERRED [id number] Wolfgang Phoenix, M.D.  PROCEDURE DATE: 07/03/2014 PROCEDURE:   EGD w/ biopsy  INDICATIONS:Anemia. JUL 2015 FERRITIN 10 Hb 8.8 MCV 71 MEDICATIONS: TCS+ Demerol 25 mg IV and Versed 2 mg IV TOPICAL ANESTHETIC:   Viscous Xylocaine  DESCRIPTION OF PROCEDURE:     Physical exam was performed.  Informed consent was obtained from the patient after explaining the benefits, risks, and alternatives to the procedure.  The patient was connected to the monitor and placed in the left lateral position.  Continuous oxygen was provided by nasal cannula and IV medicine administered through an indwelling cannula.  After administration of sedation, the patients esophagus was intubated and the [id number]  endoscope was advanced under direct visualization to the second portion of the duodenum.  The scope was removed slowly by carefully examining the color, texture, anatomy, and integrity of the mucosa on the way out.  The patient was recovered in endoscopy and discharged home in satisfactory condition.   ESOPHAGUS: The mucosa of the esophagus appeared normal.   STOMACH: Moderate non-erosive gastritis (inflammation) was found in the gastric antrum and gastric body.  Multiple biopsies were performed using cold forceps.   DUODENUM: The duodenal mucosa showed no abnormalities in the bulb and second portion of the duodenum.  Cold forceps biopsies were taken in the bulb and second portion. COMPLICATIONS:   None  ENDOSCOPIC IMPRESSION: 1.   NO OBVIOUS SOURCE FOR EARLY IRON DEFICINCY ANEMIA IDENTIFIED 2.   MODERATE Non-erosive gastritis  RECOMMENDATIONS: Use OMEPRAZOLE 30 minutes prior to your first meal. AVOID ITEMS THAT TRIGGER GASTRITIS. FOLLOW A HIGH FIBER/LOW FAT  DIET.  AVOID ITEMS THAT CAUSE BLOATING.  BIOPSY RESULTS WILL BE BACK IN 14 DAYS.  Next colonoscopy in 5-10 years WITH AN OVERTUBE.   REPEAT EXAM:   _______________________________ Lorrin MaisBarney Drain, MD 07/03/2014 1:57 PM

## 2014-07-03 NOTE — Interval H&P Note (Signed)
History and Physical Interval Note:  07/03/2014 8:41 AM  Joanna [name]  has presented today for surgery, with the diagnosis of rectal bleeding  The various methods of treatment have been discussed with the patient and family. After consideration of risks, benefits and other options for treatment, the patient has consented to  Procedure(s) with comments: COLONOSCOPY (N/A) - 230-moved to 8:30 - Ginger notified pt ESOPHAGOGASTRODUODENOSCOPY (EGD) (N/A) as a surgical intervention .  The patient's history has been reviewed, patient examined, no change in status, stable for surgery.  I have reviewed the patient's chart and labs.  Questions were answered to the patient's satisfaction.     [employer]

## 2014-07-03 NOTE — H&P (View-Only) (Signed)
Subjective:    Patient ID: Joanna [name], female    DOB: 1951/04/05, 63 y.o.   MRN: [id number]  [name], MD  HPI Reports BRBPR IN JUL 2015-LASTED ONE DAY JUST WHEN SHE WIPED. NO RECTAL PAIN OR PRESSURE. OCCASIONAL RECTAL ITCHING DURING THAT TIME USED PREP H AND THINGS GOT BETTER. Marland Kitchen HAD TCS 2 YRS AGO.NL Hb 2008 AND THEN NOT CHECKED AGAIN UNTIL 2015 HB 8.8 WITH MCV 71.4. WEIGHT UP SINCE 2014: 188 TO 193.4 LBS. BMs: DAILY(#5). REMEMBERED TRYING WAKE UP DURING THE ENDO. APPETITE: SO/SO. FEELING STRESSED. RETIRED FROM [employer]. HAD TO DUE TO BACK PROBLEMS.  PT DENIES FEVER, CHILLS, HEMATEMESIS, nausea, vomiting, melena, diarrhea, CHEST PAIN, SHORTNESS OF BREATH,  CHANGE IN BOWEL IN HABITS, constipation, abdominal pain, problems swallowing, OR heartburn or indigestion.  Past Medical History  Diagnosis Date  . GERD (gastroesophageal reflux disease)   . Diabetes mellitus   . Hypertension   . Depression   . Insomnia   . Diverticulosis   . Thyroid disease     hypothyroid  . Sleep apnea   . Dyslipidemia   . Microproteinuria   . Renal insufficiency 2010   Past Surgical History  Procedure Laterality Date  . Total abdominal hysterectomy w/ bilateral salpingoophorectomy    . Knee arthroscopy Right   . Esophagogastroduodenoscopy   01/29/2012    Sarea Fyfe-2-3cm sliding HH, fundic gland polyps, NEGATIVE H pylori  . Colonoscopy  01/29/12    Slayde Brault-2-3 hyperplastic polyps, mod internal hemorrhoids, diverticulosis throughout colon  . Back surgery    . Abdominal hysterectomy  1984  . Bilateral oophorectomy     Allergies  Allergen Reactions  . Ivp Dye [Iodinated Diagnostic Agents]     Current Outpatient Prescriptions  Medication Sig Dispense Refill  . aspirin 81 MG tablet Take 81 mg by mouth daily. NOT TAKING   . doxazosin (CARDURA) 2 MG tablet Take 1 tablet (2 mg total) by mouth daily.    . fexofenadine (ALLEGRA) 180 MG tablet Take 180 mg by mouth daily. PRN   . glipiZIDE (GLUCOTROL) 5  MG tablet  TAKE 1 TABLET AM AND 1 TABLET IN THE EVENING    . indapamide (LOZOL) 2.5 MG tablet Take 1 tablet (2.5 mg total) by mouth daily.    Marland Kitchen levothyroxine (SYNTHROID, LEVOTHROID) 88 MCG tablet Take 1 tablet (88 mcg total) by mouth daily.    Marland Kitchen losartan (COZAAR) 100 MG tablet TAKE 1 TABLET BY MOUTH EVERY DAY ~3 YEARS   . omeprazole (PRILOSEC) 40 MG capsule TAKE 1 CAPSULE EVERY DAY    . pravastatin (PRAVACHOL) 20 MG tablet TAKE 1 TABLET EVERY DAY    . ALPRAZolam (XANAX) 0.5 MG tablet 1/2 to 1 po BID prn anxiety    . colchicine 0.6 MG tablet Take 1 tablet (0.6 mg total) by mouth 2 (two) times daily. Patient wants to switch to generic PRN   . cyclobenzaprine (FLEXERIL) 5 MG tablet     . fluticasone (FLONASE) 50 MCG/ACT nasal spray Place 2 sprays into the nose as needed. PRN   .      Marland Kitchen HYDROcodone-acetaminophen (NORCO) 7.5-325 MG per tablet Take 1 tablet by mouth every 6 (six) hours as needed. PRN   . Magnesium 250 MG TABS Take by mouth daily. PRN   . traZODone (DESYREL) 100 MG tablet 0.5 to one tablet at bedtime PRN   . triamcinolone cream (KENALOG) 0.1 % Apply topically 2 (two) times daily.     Family History  Problem Relation Age  of Onset  . Cardiomyopathy Father   . Heart failure Mother   . Hyperlipidemia Mother   . Stroke Brother   . Cancer Brother     renal  . Kidney cancer Brother   . Diabetes Paternal Uncle   . Heart disease Paternal Grandmother     History   Social History Narrative  . No narrative on file     Review of Systems PER HPI OTHERWISE ALL SYSTEMS ARE NEGATIVE.     Objective:   Physical Exam  Vitals reviewed. Constitutional: She is oriented to person, place, and time. She appears well-developed and well-nourished. No distress.  HENT:  Head: Normocephalic and atraumatic.  Mouth/Throat: Oropharynx is clear and moist. No oropharyngeal exudate.  Eyes: Pupils are equal, round, and reactive to light. No scleral icterus.  Neck: Normal range of motion. Neck  supple.  Cardiovascular: Normal rate, regular rhythm and normal heart sounds.   Pulmonary/Chest: Effort normal and breath sounds normal. No respiratory distress.  Abdominal: Soft. Bowel sounds are normal. She exhibits no distension. There is no tenderness.  Musculoskeletal: She exhibits no edema.  Lymphadenopathy:    She has no cervical adenopathy.  Neurological: She is alert and oriented to person, place, and time.  NO  NEW FOCAL DEFICITS   Psychiatric: She has a normal mood and affect.          Assessment & Plan:

## 2014-07-03 NOTE — Discharge Instructions (Signed)
NO SOURCE FOR YOUR LOW BLOOD COUNT WAS IDENTIFIED. You had 3 polyps removed. YOU HAVE DIVERTICULOSIS IN THE ENTIRE COLON. Your RECTAL BLEEDING IS MOST LIKELY DUE TO internal hemorrhoids. You have gastritis.  I biopsied your stomach.   Use OMEPRAZOLE 30 minutes prior to your first meal.  AVOID ITEMS THAT TRIGGER GASTRITIS. SEE INFO BELOW.  FOLLOW A HIGH FIBER/LOW FAT DIET. AVOID ITEMS THAT CAUSE BLOATING. SEE INFO BELOW.  YOUR BIOPSY RESULTS WILL BE BACK IN 14 DAYS.  Next colonoscopy in 5-10 years.   ENDOSCOPY Care After Read the instructions outlined below and refer to this sheet in the next week. These discharge instructions provide you with general information on caring for yourself after you leave the hospital. While your treatment has been planned according to the most current medical practices available, unavoidable complications occasionally occur. If you have any problems or questions after discharge, call DR. Farooq Petrovich, [phone].  ACTIVITY  You may resume your regular activity, but move at a slower pace for the next 24 hours.   Take frequent rest periods for the next 24 hours.   Walking will help get rid of the air and reduce the bloated feeling in your belly (abdomen).   No driving for 24 hours (because of the medicine (anesthesia) used during the test).   You may shower.   Do not sign any important legal documents or operate any machinery for 24 hours (because of the anesthesia used during the test).    NUTRITION  Drink plenty of fluids.   You may resume your normal diet as instructed by your doctor.   Begin with a light meal and progress to your normal diet. Heavy or fried foods are harder to digest and may make you feel sick to your stomach (nauseated).   Avoid alcoholic beverages for 24 hours or as instructed.    MEDICATIONS  You may resume your normal medications.   WHAT YOU CAN EXPECT TODAY  Some feelings of bloating in the abdomen.   Passage of  more gas than usual.   Spotting of blood in your stool or on the toilet paper  .  IF YOU HAD POLYPS REMOVED DURING THE ENDOSCOPY:  Eat a soft diet IF YOU HAVE NAUSEA, BLOATING, ABDOMINAL PAIN, OR VOMITING.    FINDING OUT THE RESULTS OF YOUR TEST Not all test results are available during your visit. DR. Oneida Alar WILL CALL YOU WITHIN 14 DAYS OF YOUR PROCEDUE WITH YOUR RESULTS. Do not assume everything is normal if you have not heard from DR. Nakai Yard IN TWO WEEKS, CALL HER OFFICE AT [phone].  SEEK IMMEDIATE MEDICAL ATTENTION AND CALL THE OFFICE: [phone] IF:  You have more than a spotting of blood in your stool.   Your belly is swollen (abdominal distention).   You are nauseated or vomiting.   You have a temperature over 101F.   You have abdominal pain or discomfort that is severe or gets worse throughout the day.   Gastritis  Gastritis is an inflammation (the body's way of reacting to injury and/or infection) of the stomach. It is often caused by viral or bacterial (germ) infections. It can also be caused BY ALCOHOL, ASPIRIN, BC/GOODY POWDER'S, (IBUPROFEN) MOTRIN, OR ALEVE (NAPROXEN), chemicals (including alcohol), SPICY FOODS, and medications. This illness may be associated with generalized malaise (feeling tired, not well), UPPER ABDOMINAL STOMACH cramps, and fever. One common bacterial cause of gastritis is an organism known as H. Pylori. This can be treated with antibiotics.    High-Fiber  Diet A high-fiber diet changes your normal diet to include more whole grains, legumes, fruits, and vegetables. Changes in the diet involve replacing refined carbohydrates with unrefined foods. The calorie level of the diet is essentially unchanged. The Dietary Reference Intake (recommended amount) for adult males is 38 grams per day. For adult females, it is 25 grams per day. Pregnant and lactating women should consume 28 grams of fiber per day. Fiber is the intact part of a plant that is  not broken down during digestion. Functional fiber is fiber that has been isolated from the plant to provide a beneficial effect in the body. PURPOSE  Increase stool bulk.   Ease and regulate bowel movements.   Lower cholesterol.  INDICATIONS THAT YOU NEED MORE FIBER  Constipation and hemorrhoids.   Uncomplicated diverticulosis (intestine condition) and irritable bowel syndrome.   Weight management.   As a protective measure against hardening of the arteries (atherosclerosis), diabetes, and cancer.   GUIDELINES FOR INCREASING FIBER IN THE DIET  Start adding fiber to the diet slowly. A gradual increase of about 5 more grams (2 slices of whole-wheat bread, 2 servings of most fruits or vegetables, or 1 bowl of high-fiber cereal) per day is best. Too rapid an increase in fiber may result in constipation, flatulence, and bloating.   Drink enough water and fluids to keep your urine clear or pale yellow. Water, juice, or caffeine-free drinks are recommended. Not drinking enough fluid may cause constipation.   Eat a variety of high-fiber foods rather than one type of fiber.   Try to increase your intake of fiber through using high-fiber foods rather than fiber pills or supplements that contain small amounts of fiber.   The goal is to change the types of food eaten. Do not supplement your present diet with high-fiber foods, but replace foods in your present diet.  INCLUDE A VARIETY OF FIBER SOURCES  Replace refined and processed grains with whole grains, canned fruits with fresh fruits, and incorporate other fiber sources. White rice, white breads, and most bakery goods contain little or no fiber.   Brown whole-grain rice, buckwheat oats, and many fruits and vegetables are all good sources of fiber. These include: broccoli, Brussels sprouts, cabbage, cauliflower, beets, sweet potatoes, white potatoes (skin on), carrots, tomatoes, eggplant, squash, berries, fresh fruits, and dried fruits.    Cereals appear to be the richest source of fiber. Cereal fiber is found in whole grains and bran. Bran is the fiber-rich outer coat of cereal grain, which is largely removed in refining. In whole-grain cereals, the bran remains. In breakfast cereals, the largest amount of fiber is found in those with "bran" in their names. The fiber content is sometimes indicated on the label.   You may need to include additional fruits and vegetables each day.   In baking, for 1 cup white flour, you may use the following substitutions:   1 cup whole-wheat flour minus 2 tablespoons.   1/2 cup white flour plus 1/2 cup whole-wheat flour.   Low-Fat Diet BREADS, CEREALS, PASTA, RICE, DRIED PEAS, AND BEANS These products are high in carbohydrates and most are low in fat. Therefore, they can be increased in the diet as substitutes for fatty foods. They too, however, contain calories and should not be eaten in excess. Cereals can be eaten for snacks as well as for breakfast.  Include foods that contain fiber (fruits, vegetables, whole grains, and legumes). Research shows that fiber may lower blood cholesterol levels, especially the water-soluble  fiber found in fruits, vegetables, oat products, and legumes. FRUITS AND VEGETABLES It is good to eat fruits and vegetables. Besides being sources of fiber, both are rich in vitamins and some minerals. They help you get the daily allowances of these nutrients. Fruits and vegetables can be used for snacks and desserts. MEATS Limit lean meat, chicken, [place], and fish to no more than 6 ounces per day. Beef, Pork, and Lamb Use lean cuts of beef, pork, and lamb. Lean cuts include:  Extra-lean ground beef.  Arm roast.  Sirloin tip.  Center-cut ham.  Round steak.  Loin chops.  Rump roast.  Tenderloin.  Trim all fat off the outside of meats before cooking. It is not necessary to severely decrease the intake of red meat, but lean choices should be made. Lean meat is rich in  protein and contains a highly absorbable form of iron. Premenopausal women, in particular, should avoid reducing lean red meat because this could increase the risk for low red blood cells (iron-deficiency anemia). The organ meats, such as liver, sweetbreads, kidneys, and brain are very rich in cholesterol. They should be limited. Chicken and [place] These are good sources of protein. The fat of poultry can be reduced by removing the skin and underlying fat layers before cooking. Chicken and [place] can be substituted for lean red meat in the diet. Poultry should not be fried or covered with high-fat sauces. Fish and Shellfish Fish is a good source of protein. Shellfish contain cholesterol, but they usually are low in saturated fatty acids. The preparation of fish is important. Like chicken and [place], they should not be fried or covered with high-fat sauces. EGGS Egg whites contain no fat or cholesterol. They can be eaten often. Try 1 to 2 egg whites instead of whole eggs in recipes or use egg substitutes that do not contain yolk. MILK AND DAIRY PRODUCTS Use skim or 1% milk instead of 2% or whole milk. Decrease whole milk, natural, and processed cheeses. Use nonfat or low-fat (2%) cottage cheese or low-fat cheeses made from vegetable oils. Choose nonfat or low-fat (1 to 2%) yogurt. Experiment with evaporated skim milk in recipes that call for heavy cream. Substitute low-fat yogurt or low-fat cottage cheese for sour cream in dips and salad dressings. Have at least 2 servings of low-fat dairy products, such as 2 glasses of skim (or 1%) milk each day to help get your daily calcium intake.  FATS AND OILS Reduce the total intake of fats, especially saturated fat. Butterfat, lard, and beef fats are high in saturated fat and cholesterol. These should be avoided as much as possible. Vegetable fats do not contain cholesterol, but certain vegetable fats, such as coconut oil, palm oil, and palm kernel oil are very  high in saturated fats. These should be limited. These fats are often used in bakery goods, processed foods, popcorn, oils, and nondairy creamers. Vegetable shortenings and some peanut butters contain hydrogenated oils, which are also saturated fats. Read the labels on these foods and check for saturated vegetable oils. Unsaturated vegetable oils and fats do not raise blood cholesterol. However, they should be limited because they are fats and are high in calories. Total fat should still be limited to 30% of your daily caloric intake. Desirable liquid vegetable oils are corn oil, cottonseed oil, olive oil, canola oil, safflower oil, soybean oil, and sunflower oil. Peanut oil is not as good, but small amounts are acceptable. Buy a heart-healthy tub margarine that has no partially hydrogenated  oils in the ingredients. Mayonnaise and salad dressings often are made from unsaturated fats, but they should also be limited because of their high calorie and fat content. Seeds, nuts, peanut butter, olives, and avocados are high in fat, but the fat is mainly the unsaturated type. These foods should be limited mainly to avoid excess calories and fat. OTHER EATING TIPS Snacks  Most sweets should be limited as snacks. They tend to be rich in calories and fats, and their caloric content outweighs their nutritional value. Some good choices in snacks are graham crackers, melba toast, soda crackers, bagels (no egg), English muffins, fruits, and vegetables. These snacks are preferable to snack crackers, [place] fries, and chips. Popcorn should be air-popped or cooked in small amounts of liquid vegetable oil. Desserts Eat fruit, low-fat yogurt, and fruit ices. AVOID pastries, cake, and cookies. Sherbet, angel food cake, gelatin dessert, frozen low-fat yogurt, or other frozen products that do not contain saturated fat (pure fruit juice bars, frozen ice pops) are also acceptable.  COOKING METHODS Choose those methods that use  little or no fat. They include: Poaching.  Braising.  Steaming.  Grilling.  Baking.  Stir-frying.  Broiling.  Microwaving.  Foods can be cooked in a nonstick pan without added fat, or use a nonfat cooking spray in regular cookware. Limit fried foods and avoid frying in saturated fat. Add moisture to lean meats by using water, broth, cooking wines, and other nonfat or low-fat sauces along with the cooking methods mentioned above. Soups and stews should be chilled after cooking. The fat that forms on top after a few hours in the refrigerator should be skimmed off. When preparing meals, avoid using excess salt. Salt can contribute to raising blood pressure in some people. EATING AWAY FROM HOME Order entres, potatoes, and vegetables without sauces or butter. When meat exceeds the size of a deck of cards (3 to 4 ounces), the rest can be taken home for another meal. Choose vegetable or fruit salads and ask for low-calorie salad dressings to be served on the side. Use dressings sparingly. Limit high-fat toppings, such as bacon, crumbled eggs, cheese, sunflower seeds, and olives. Ask for heart-healthy tub margarine instead of butter.  Diverticulosis Diverticulosis is a common condition that develops when small pouches (diverticula) form in the wall of the colon. The risk of diverticulosis increases with age. It happens more often in people who eat a low-fiber diet. Most individuals with diverticulosis have no symptoms. Those individuals with symptoms usually experience belly (abdominal) pain, constipation, or loose stools (diarrhea).  HOME CARE INSTRUCTIONS  Increase the amount of fiber in your diet as directed by your caregiver or dietician. This may reduce symptoms of diverticulosis.   Drink at least 6 to 8 glasses of water each day to prevent constipation.   Try not to strain when you have a bowel movement.   NO NEED TO Avoid nuts and seeds to prevent AN INFECTION.  FOODS HAVING HIGH FIBER  CONTENT INCLUDE:  Fruits. Apple, peach, pear, tangerine, raisins, prunes.   Vegetables. Brussels sprouts, asparagus, broccoli, cabbage, carrot, cauliflower, romaine lettuce, spinach, summer squash, tomato, winter squash, zucchini.   Starchy Vegetables. Baked beans, kidney beans, lima beans, split peas, lentils, potatoes (with skin).   Grains. Whole wheat bread, brown rice, bran flake cereal, plain oatmeal, white rice, shredded wheat, bran muffins.    SEEK IMMEDIATE MEDICAL CARE IF:  You develop increasing pain or severe bloating.   You have an oral temperature above 101F.  You develop vomiting or bowel movements that are bloody or black.   Hemorrhoids Hemorrhoids are dilated (enlarged) veins around the rectum. Sometimes clots will form in the veins. This makes them swollen and painful. These are called thrombosed hemorrhoids. Causes of hemorrhoids include:  Constipation.   Straining to have a bowel movement.   HEAVY LIFTING HOME CARE INSTRUCTIONS  Eat a well balanced diet and drink 6 to 8 glasses of water every day to avoid constipation. You may also use a bulk laxative.   Avoid straining to have bowel movements.   Keep anal area dry and clean.   Do not use a donut shaped pillow or sit on the toilet for long periods. This increases blood pooling and pain.   Move your bowels when your body has the urge; this will require less straining and will decrease pain and pressure.

## 2014-07-03 NOTE — Progress Notes (Signed)
Pt placed on CPAP auto with nasal mask with a 3L oxygen bleed in.  Pt tolerating well at this time.  RT will continue to monitor.

## 2014-07-20 ENCOUNTER — Encounter: Payer: Self-pay | Admitting: Family Medicine

## 2014-08-04 ENCOUNTER — Other Ambulatory Visit: Payer: Self-pay

## 2014-08-04 ENCOUNTER — Telehealth: Payer: Self-pay | Admitting: Gastroenterology

## 2014-08-04 DIAGNOSIS — D509 Iron deficiency anemia, unspecified: Secondary | ICD-10-CM

## 2014-08-04 NOTE — Telephone Encounter (Signed)
Referral has been made and she is set up for [facility] on 08/13/14 @ 730

## 2014-08-04 NOTE — Telephone Encounter (Signed)
Please call pt. She had HYPERPLASTIC POLYPS removed. HER stomach Bx shows gastritis.  HER SMALL BOWEL BIOPSIES ARE NORMAL. NO SOURCE FOR HER LOW BLOOD COUNT WAS IDENTIFIED ON BIOPSY.   SHE NEEDS A [facility] CAPSULE STUDY WITHIN THE NEXT 2 WEEKS, [id number] GI BLEED/IRON DEFICIENCY ANEMIA(FEDA). SHE NEEDS TO SEE THE BLOOD SPECIALIST(HEMATOLOGY) AS WELL TO CONSIDER IV IRON  Use OMEPRAZOLE 30 minutes prior to your first meal.  AVOID ITEMS THAT TRIGGER GASTRITIS.   FOLLOW A HIGH FIBER/LOW FAT DIET. AVOID ITEMS THAT CAUSE BLOATING.  FOLLOW UP IN FEB 2016 E30 ANEMIA/OBSCURE GI BLEED.  Next colonoscopy in 10 years WITH AN OVERTUBE.

## 2014-08-04 NOTE — Telephone Encounter (Signed)
OV NIC'D

## 2014-08-04 NOTE — Telephone Encounter (Signed)
Reminders in epic °

## 2014-08-04 NOTE — Telephone Encounter (Signed)
Pt is aware.  

## 2014-08-04 NOTE — Telephone Encounter (Signed)
REVIEWED-NO ADDITIONAL RECOMMENDATIONS. 

## 2014-08-05 ENCOUNTER — Encounter (HOSPITAL_COMMUNITY): Payer: Self-pay | Admitting: Pharmacy Technician

## 2014-08-06 ENCOUNTER — Encounter: Payer: Self-pay | Admitting: Family Medicine

## 2014-08-06 ENCOUNTER — Ambulatory Visit (INDEPENDENT_AMBULATORY_CARE_PROVIDER_SITE_OTHER): Payer: 59 | Admitting: Family Medicine

## 2014-08-06 VITALS — BP 124/86 | Ht 62.0 in | Wt 196.0 lb

## 2014-08-06 DIAGNOSIS — Z23 Encounter for immunization: Secondary | ICD-10-CM

## 2014-08-06 DIAGNOSIS — M25511 Pain in right shoulder: Secondary | ICD-10-CM

## 2014-08-06 DIAGNOSIS — M6530 Trigger finger, unspecified finger: Secondary | ICD-10-CM

## 2014-08-06 DIAGNOSIS — E038 Other specified hypothyroidism: Secondary | ICD-10-CM

## 2014-08-06 DIAGNOSIS — E119 Type 2 diabetes mellitus without complications: Secondary | ICD-10-CM

## 2014-08-06 DIAGNOSIS — I10 Essential (primary) hypertension: Secondary | ICD-10-CM

## 2014-08-06 LAB — BASIC METABOLIC PANEL
BUN: 19 mg/dL (ref 6–23)
CO2: 27 mEq/L (ref 19–32)
Calcium: 9.4 mg/dL (ref 8.4–10.5)
Chloride: 104 mEq/L (ref 96–112)
Creat: 1.41 mg/dL — ABNORMAL HIGH (ref 0.50–1.10)
Glucose, Bld: 133 mg/dL — ABNORMAL HIGH (ref 70–99)
Potassium: 3.7 mEq/L (ref 3.5–5.3)
Sodium: 139 mEq/L (ref 135–145)

## 2014-08-06 LAB — CBC WITH DIFFERENTIAL/PLATELET
Basophils Absolute: 0.1 10*3/uL (ref 0.0–0.1)
Basophils Relative: 1 % (ref 0–1)
Eosinophils Absolute: 0.2 10*3/uL (ref 0.0–0.7)
Eosinophils Relative: 3 % (ref 0–5)
HCT: 34.6 % — ABNORMAL LOW (ref 36.0–46.0)
Hemoglobin: 11.2 g/dL — ABNORMAL LOW (ref 12.0–15.0)
Lymphocytes Relative: 42 % (ref 12–46)
Lymphs Abs: 2.4 10*3/uL (ref 0.7–4.0)
MCH: 25.3 pg — ABNORMAL LOW (ref 26.0–34.0)
MCHC: 32.4 g/dL (ref 30.0–36.0)
MCV: 78.1 fL (ref 78.0–100.0)
Monocytes Absolute: 0.3 10*3/uL (ref 0.1–1.0)
Monocytes Relative: 6 % (ref 3–12)
Neutro Abs: 2.7 10*3/uL (ref 1.7–7.7)
Neutrophils Relative %: 48 % (ref 43–77)
Platelets: 314 10*3/uL (ref 150–400)
RBC: 4.43 MIL/uL (ref 3.87–5.11)
RDW: 18.5 % — ABNORMAL HIGH (ref 11.5–15.5)
WBC: 5.7 10*3/uL (ref 4.0–10.5)

## 2014-08-06 LAB — TSH: TSH: 1.393 u[IU]/mL (ref 0.350–4.500)

## 2014-08-06 LAB — POCT GLYCOSYLATED HEMOGLOBIN (HGB A1C): Hemoglobin A1C: 8.2

## 2014-08-06 MED ORDER — ALPRAZOLAM 0.5 MG PO TABS: ORAL_TABLET | ORAL | Status: DC

## 2014-08-06 MED ORDER — TRAZODONE HCL 100 MG PO TABS: ORAL_TABLET | ORAL | Status: DC

## 2014-08-06 MED ORDER — HYDROCODONE-ACETAMINOPHEN 10-325 MG PO TABS: 1.0000 | ORAL_TABLET | ORAL | Status: DC | PRN

## 2014-08-06 MED ORDER — INDAPAMIDE 2.5 MG PO TABS: 2.5000 mg | ORAL_TABLET | Freq: Every day | ORAL | Status: DC

## 2014-08-06 NOTE — Progress Notes (Signed)
   Subjective:    Patient ID: Joanna [name], female    DOB: [birth year], 64 y.o.   MRN: [id number]  Diabetes She presents for her follow-up diabetic visit. She has type 2 diabetes mellitus. There are no hypoglycemic associated symptoms. Pertinent negatives for hypoglycemia include no confusion. There are no diabetic associated symptoms. Pertinent negatives for diabetes include no fatigue, no polydipsia, no polyphagia and no weakness. Symptoms are stable. Current diabetic treatment includes oral agent (monotherapy). She is compliant with treatment all of the time. She monitors blood glucose at home 1-2 x per day. Blood glucose monitoring compliance is good. There is no change in her home blood glucose trend. Her overall blood glucose range is 110-130 mg/dl. She does not see a podiatrist.Eye exam is current.   Would like flu vaccine.  Right shoulder pain for a week now. She cannot remember if she did anything to injure it.  We talked at length about this causes pain and discomfort with rotation and extension She also has trigger finger and right hand would like to see orthopedics  Review of Systems  Constitutional: Negative for activity change, appetite change and fatigue.  Endocrine: Negative for polydipsia and polyphagia.  Genitourinary: Negative for frequency.  Neurological: Negative for weakness.  Psychiatric/Behavioral: Negative for confusion.       Objective:   Physical Exam Lungs clear heart regular pulse normal blood pressure good extremities no edema abdomen soft      trigger finger right small fingfer and right shoulder bursitis Assessment & Plan:  Trigger finger in the hand referral to orthopedics Right shoulder pain exercises shown probable bursitis cannot take anti-inflammatories do stretching exercises this should help if not doing better by the time she sees orthopedics consider injection Diabetes subpar control she did not do well with watching her diet she is going to do a  better job and try to stay more physically active as well  We increase the strength of her pain medicine today to hopefully give her some better relief

## 2014-08-13 ENCOUNTER — Ambulatory Visit (HOSPITAL_COMMUNITY): Admission: RE | Admit: 2014-08-13 | Payer: 59 | Source: Ambulatory Visit | Admitting: Gastroenterology

## 2014-08-13 ENCOUNTER — Ambulatory Visit (HOSPITAL_COMMUNITY): Payer: 59

## 2014-08-13 SURGERY — IMAGING PROCEDURE, GI TRACT, INTRALUMINAL, VIA CAPSULE

## 2014-08-13 NOTE — Progress Notes (Signed)
This encounter was created in error - please disregard.

## 2014-08-25 ENCOUNTER — Encounter (HOSPITAL_COMMUNITY): Payer: 59 | Attending: Hematology and Oncology

## 2014-08-25 ENCOUNTER — Encounter (HOSPITAL_COMMUNITY): Payer: Self-pay

## 2014-08-25 VITALS — BP 134/86 | HR 102 | Temp 98.5°F | Resp 18 | Wt 197.8 lb

## 2014-08-25 DIAGNOSIS — E039 Hypothyroidism, unspecified: Secondary | ICD-10-CM | POA: Diagnosis not present

## 2014-08-25 DIAGNOSIS — I10 Essential (primary) hypertension: Secondary | ICD-10-CM | POA: Diagnosis not present

## 2014-08-25 DIAGNOSIS — M109 Gout, unspecified: Secondary | ICD-10-CM | POA: Diagnosis not present

## 2014-08-25 DIAGNOSIS — J309 Allergic rhinitis, unspecified: Secondary | ICD-10-CM | POA: Diagnosis not present

## 2014-08-25 DIAGNOSIS — K909 Intestinal malabsorption, unspecified: Secondary | ICD-10-CM

## 2014-08-25 DIAGNOSIS — E119 Type 2 diabetes mellitus without complications: Secondary | ICD-10-CM | POA: Insufficient documentation

## 2014-08-25 DIAGNOSIS — K219 Gastro-esophageal reflux disease without esophagitis: Secondary | ICD-10-CM

## 2014-08-25 DIAGNOSIS — E611 Iron deficiency: Secondary | ICD-10-CM | POA: Diagnosis not present

## 2014-08-25 DIAGNOSIS — E780 Pure hypercholesterolemia: Secondary | ICD-10-CM | POA: Insufficient documentation

## 2014-08-25 DIAGNOSIS — Z86010 Personal history of colonic polyps: Secondary | ICD-10-CM

## 2014-08-25 DIAGNOSIS — R58 Hemorrhage, not elsewhere classified: Secondary | ICD-10-CM | POA: Diagnosis not present

## 2014-08-25 LAB — IRON AND TIBC
Iron: 52 ug/dL (ref 42–135)
Saturation Ratios: 15 % — ABNORMAL LOW (ref 20–55)
TIBC: 350 ug/dL (ref 250–470)
UIBC: 298 ug/dL (ref 125–400)

## 2014-08-25 LAB — CBC WITH DIFFERENTIAL/PLATELET
Basophils Absolute: 0 10*3/uL (ref 0.0–0.1)
Basophils Relative: 1 % (ref 0–1)
Eosinophils Absolute: 0.1 10*3/uL (ref 0.0–0.7)
Eosinophils Relative: 2 % (ref 0–5)
HCT: 34.2 % — ABNORMAL LOW (ref 36.0–46.0)
Hemoglobin: 11.2 g/dL — ABNORMAL LOW (ref 12.0–15.0)
Lymphocytes Relative: 36 % (ref 12–46)
Lymphs Abs: 1.9 10*3/uL (ref 0.7–4.0)
MCH: 26.5 pg (ref 26.0–34.0)
MCHC: 32.7 g/dL (ref 30.0–36.0)
MCV: 81 fL (ref 78.0–100.0)
Monocytes Absolute: 0.3 10*3/uL (ref 0.1–1.0)
Monocytes Relative: 6 % (ref 3–12)
Neutro Abs: 3 10*3/uL (ref 1.7–7.7)
Neutrophils Relative %: 55 % (ref 43–77)
Platelets: 290 10*3/uL (ref 150–400)
RBC: 4.22 MIL/uL (ref 3.87–5.11)
RDW: 16.5 % — ABNORMAL HIGH (ref 11.5–15.5)
WBC: 5.3 10*3/uL (ref 4.0–10.5)

## 2014-08-25 LAB — FOLATE: Folate: >20 ng/mL

## 2014-08-25 LAB — LACTATE DEHYDROGENASE: LDH: 219 U/L (ref 94–250)

## 2014-08-25 LAB — VITAMIN B12: Vitamin B-12: 692 pg/mL (ref 211–911)

## 2014-08-25 LAB — FERRITIN: Ferritin: 29 ng/mL (ref 10–291)

## 2014-08-25 NOTE — Progress Notes (Signed)
[name]'s reason for visit today is for labs as scheduled per MD orders.  Venipuncture performed with a 23 gauge butterfly needle to L Antecubital.  Joanna [name] tolerated procedure well and without incident; questions were answered and patient was discharged.

## 2014-08-25 NOTE — Patient Instructions (Signed)
[facility] Discharge Instructions  RECOMMENDATIONS MADE BY THE CONSULTANT AND ANY TEST RESULTS WILL BE SENT TO YOUR REFERRING PHYSICIAN.  We will see you on the 12th for Feraheme infusion and again on the 19th for your repeat infusion. You will see the doctor in 6 weeks for follow up and repeat lab work.  Please call for any questions or concerns.   Thank you for choosing [facility] to provide your oncology and hematology care.  To afford each patient quality time with our providers, please arrive at least 15 minutes before your scheduled appointment time.  With your help, our goal is to use those 15 minutes to complete the necessary work-up to ensure our physicians have the information they need to help with your evaluation and healthcare recommendations.    Effective January 1st, 2014, we ask that you re-schedule your appointment with our physicians should you arrive 10 or more minutes late for your appointment.  We strive to give you quality time with our providers, and arriving late affects you and other patients whose appointments are after yours.    Again, thank you for choosing [facility].  Our hope is that these requests will decrease the amount of time that you wait before being seen by our physicians.       _____________________________________________________________  Should you have questions after your visit to [facility], please contact our office at [phone] between the hours of 8:30 a.m. and 4:30 p.m.  Voicemails left after 4:30 p.m. will not be returned until the following business day.  For prescription refill requests, have your pharmacy contact our office with your prescription refill request.    _______________________________________________________________  We hope that we have given you very good care.  You may receive a patient satisfaction survey in the mail, please complete it and return it as soon  as possible.  We value your feedback!  _______________________________________________________________  Have you asked about our STAR program?  STAR stands for Survivorship Training and Rehabilitation, and this is a nationally recognized cancer care program that focuses on survivorship and rehabilitation.  Cancer and cancer treatments may cause problems, such as, pain, making you feel tired and keeping you from doing the things that you need or want to do. Cancer rehabilitation can help. Our goal is to reduce these troubling effects and help you have the best quality of life possible.  You may receive a survey from a nurse that asks questions about your current state of health.  Based on the survey results, all eligible patients will be referred to the [facility] program for an evaluation so we can better serve you!  A frequently asked questions sheet is available upon request.

## 2014-08-25 NOTE — Progress Notes (Signed)
[place] A. Barnet Glasgow, M.D.  NEW PATIENT EVALUATION   Name: Joanna [name] Date: 08/26/2014 MRN: [id number] DOB: [birth year]  PCP: [name], MD   REFERRING PHYSICIAN: [name], MD  REASON FOR REFERRAL: IRON DEFICIENCY     HISTORY OF PRESENT ILLNESS:Joanna [name] is a 63 y.o. female who is referred by family physician and gastroenterologist for evaluation and management of iron deficiency with a history of gastroesophageal reflux disease on long-term proton pump inhibitors as well as findings of colonic polyps on colonoscopy. Outpatient ferritin determination on 04/28/2014 was 10.  She does crave ice and has been more fatigued of late. She was started on iron supplements with consequent melena. She denies any nausea, vomiting, but is constipated since being on oral iron supplements and analgesics. She denies any cough, shortness of breath, wheezing, lower extremity swelling or redness, nail changes, hair changes, skin rash, muscle aches, hematuria, vaginal bleeding, hemoptysis, epistaxis, or hematochezia.   PAST MEDICAL HISTORY:  has a past medical history of GERD (gastroesophageal reflux disease); Diabetes mellitus; Hypertension; Depression; Insomnia; Diverticulosis; Thyroid disease; Sleep apnea; Dyslipidemia; Microproteinuria; and Renal insufficiency (2010).     PAST SURGICAL HISTORY: Past Surgical History  Procedure Laterality Date  . Total abdominal hysterectomy w/ bilateral salpingoophorectomy    . Knee arthroscopy Right   . Esophagogastroduodenoscopy   01/29/2012    Fields-2-3cm sliding HH, fundic gland polyps, NEGATIVE H pylori  . Colonoscopy  01/29/12    Fields-2-3 hyperplastic polyps, mod internal hemorrhoids, diverticulosis throughout colon  . Back surgery    . Abdominal hysterectomy  1984  . Bilateral oophorectomy    . Colonoscopy N/A 07/03/2014    Procedure: COLONOSCOPY;  Surgeon: [name], MD;  Location: [facility]  ENDO SUITE;  Service: Endoscopy;  Laterality: N/A;  230-moved to 8:30 - Ginger notified pt  . Esophagogastroduodenoscopy N/A 07/03/2014    Procedure: ESOPHAGOGASTRODUODENOSCOPY (EGD);  Surgeon: [name], MD;  Location: [facility] ENDO SUITE;  Service: Endoscopy;  Laterality: N/A;     CURRENT MEDICATIONS: has a current medication list which includes the following prescription(s): alprazolam, colchicine, fexofenadine, fluticasone, glipizide, hydrocodone-acetaminophen, indapamide, levothyroxine, losartan, magnesium, omeprazole, pravastatin, and trazodone.   ALLERGIES: Ivp dye   SOCIAL HISTORY:  reports that she quit smoking about 3 years ago. Her smoking use included Cigarettes. She has a 15 pack-year smoking history. She quit smokeless tobacco use about 3 years ago. She reports that she drinks alcohol. She reports that she does not use illicit drugs.   FAMILY HISTORY: family history includes Cancer in her brother; Cardiomyopathy in her father; Diabetes in her paternal uncle; Heart disease in her paternal grandmother; Heart failure in her mother; Hyperlipidemia in her mother; Kidney cancer in her brother; Stroke in her brother.    REVIEW OF SYSTEMS:  Other than that discussed above is noncontributory.    PHYSICAL EXAM:  weight is 197 lb 12.8 oz (89.721 kg). Her oral temperature is 98.5 F (36.9 C). Her blood pressure is 134/86 and her pulse is 102. Her respiration is 18 and oxygen saturation is 98%.    GENERAL:alert, no distress and comfortable. Moderately obese. SKIN: skin color, texture, turgor are normal, no rashes or significant lesions EYES: normal, Conjunctiva are pink and non-injected, sclera clear OROPHARYNX:no exudate, no erythema and lips, buccal mucosa, and tongue normal  NECK: supple, thyroid normal size, non-tender, without nodularity CHEST: normal AP diameter with no breast masses. LYMPH:  no palpable  lymphadenopathy in the cervical, axillary or inguinal LUNGS: clear to  auscultation and percussion with normal breathing effort HEART: regular rate & rhythm and no murmurs ABDOMEN:abdomen soft, non-tender and normal bowel sounds. Obese and soft with no organomegaly, ascites, or CVA tenderness. MUSCULOSKELETALl:no cyanosis of digits, no clubbing or edema  NEURO: alert & oriented x 3 with fluent speech, no focal motor/sensory deficits    LABORATORY DATA:   04/28/2014: Ferritin 10  Office Visit on 08/25/2014  Component Date Value Ref Range Status  . WBC 08/25/2014 5.3  4.0 - 10.5 K/uL Final  . RBC 08/25/2014 4.22  3.87 - 5.11 MIL/uL Final  . Hemoglobin 08/25/2014 11.2* 12.0 - 15.0 g/dL Final  . HCT 08/25/2014 34.2* 36.0 - 46.0 % Final  . MCV 08/25/2014 81.0  78.0 - 100.0 fL Final  . MCH 08/25/2014 26.5  26.0 - 34.0 pg Final  . MCHC 08/25/2014 32.7  30.0 - 36.0 g/dL Final  . RDW 08/25/2014 16.5* 11.5 - 15.5 % Final  . Platelets 08/25/2014 290  150 - 400 K/uL Final  . Neutrophils Relative % 08/25/2014 55  43 - 77 % Final  . Neutro Abs 08/25/2014 3.0  1.7 - 7.7 K/uL Final  . Lymphocytes Relative 08/25/2014 36  12 - 46 % Final  . Lymphs Abs 08/25/2014 1.9  0.7 - 4.0 K/uL Final  . Monocytes Relative 08/25/2014 6  3 - 12 % Final  . Monocytes Absolute 08/25/2014 0.3  0.1 - 1.0 K/uL Final  . Eosinophils Relative 08/25/2014 2  0 - 5 % Final  . Eosinophils Absolute 08/25/2014 0.1  0.0 - 0.7 K/uL Final  . Basophils Relative 08/25/2014 1  0 - 1 % Final  . Basophils Absolute 08/25/2014 0.0  0.0 - 0.1 K/uL Final  . LDH 08/25/2014 219  94 - 250 U/L Final  . Iron 08/25/2014 52  42 - 135 ug/dL Final  . TIBC 08/25/2014 350  250 - 470 ug/dL Final  . Saturation Ratios 08/25/2014 15* 20 - 55 % Final  . UIBC 08/25/2014 298  125 - 400 ug/dL Final   Performed at [facility]  . Ferritin 08/25/2014 29  10 - 291 ng/mL Final   Performed at [facility]  . Vitamin B-12 08/25/2014 692  211 - 911 pg/mL Final   Performed at [facility]  . Folate  08/25/2014 >20.0   Final   Comment: (NOTE) Reference Ranges        Deficient:       0.4 - 3.3 ng/mL        Indeterminate:   3.4 - 5.4 ng/mL        Normal:              > 5.4 ng/mL Performed at [facility] Visit on 08/06/2014  Component Date Value Ref Range Status  . Hemoglobin A1C 08/06/2014 8.2   Final  . TSH 08/06/2014 1.393  0.350 - 4.500 uIU/mL Final  . Sodium 08/06/2014 139  135 - 145 mEq/L Final  . Potassium 08/06/2014 3.7  3.5 - 5.3 mEq/L Final  . Chloride 08/06/2014 104  96 - 112 mEq/L Final  . CO2 08/06/2014 27  19 - 32 mEq/L Final  . Glucose, Bld 08/06/2014 133* 70 - 99 mg/dL Final  . BUN 08/06/2014 19  6 - 23 mg/dL Final  . Creat 08/06/2014 1.41* 0.50 - 1.10 mg/dL Final  . Calcium 08/06/2014 9.4  8.4 - 10.5 mg/dL Final  .  WBC 08/06/2014 5.7  4.0 - 10.5 K/uL Final  . RBC 08/06/2014 4.43  3.87 - 5.11 MIL/uL Final  . Hemoglobin 08/06/2014 11.2* 12.0 - 15.0 g/dL Final  . HCT 08/06/2014 34.6* 36.0 - 46.0 % Final  . MCV 08/06/2014 78.1  78.0 - 100.0 fL Final  . MCH 08/06/2014 25.3* 26.0 - 34.0 pg Final  . MCHC 08/06/2014 32.4  30.0 - 36.0 g/dL Final  . RDW 08/06/2014 18.5* 11.5 - 15.5 % Final  . Platelets 08/06/2014 314  150 - 400 K/uL Final  . Neutrophils Relative % 08/06/2014 48  43 - 77 % Final  . Neutro Abs 08/06/2014 2.7  1.7 - 7.7 K/uL Final  . Lymphocytes Relative 08/06/2014 42  12 - 46 % Final  . Lymphs Abs 08/06/2014 2.4  0.7 - 4.0 K/uL Final  . Monocytes Relative 08/06/2014 6  3 - 12 % Final  . Monocytes Absolute 08/06/2014 0.3  0.1 - 1.0 K/uL Final  . Eosinophils Relative 08/06/2014 3  0 - 5 % Final  . Eosinophils Absolute 08/06/2014 0.2  0.0 - 0.7 K/uL Final  . Basophils Relative 08/06/2014 1  0 - 1 % Final  . Basophils Absolute 08/06/2014 0.1  0.0 - 0.1 K/uL Final  . Smear Review 08/06/2014 Criteria for review not met   Final    Urinalysis No results found for: COLORURINE, APPEARANCEUR, LABSPEC, PHURINE, GLUCOSEU, HGBUR, BILIRUBINUR,  KETONESUR, PROTEINUR, UROBILINOGEN, NITRITE, LEUKOCYTESUR    _0 : No results found.   ENDOSCOPY:   07/03/2014: EGD ENDOSCOPIC IMPRESSION: 1. NO OBVIOUS SOURCE FOR EARLY IRON DEFICINCY ANEMIA IDENTIFIED 2. MODERATE Non-erosive gastritis RECOMMENDATIONS: Use OMEPRAZOLE 30 minutes prior to your first meal. AVOID ITEMS THAT TRIGGER GASTRITIS. FOLLOW A HIGH FIBER/LOW FAT DIET. AVOID ITEMS THAT CAUSE BLOATING. BIOPSY RESULTS WILL   COLONOSCOPY: 07/03/2014 ENDOSCOPIC IMPRESSION: 1. Normal mucosa in the terminal ileum 2. Severe diverticulosis hroughout the entire examined colon 3. The LEFT COLON colon IS redundant 4. Small internal hemorrhoids 5. Three COLON polyps REMOVED  PATHOLOGY:  9/18/2015NOSIS Diagnosis 1. Rectum, polyp(s) - HYPERPLASTIC POLYPS AND POLYPOID FRAGMENTS OF BENIGN COLONIC MUCOSA WITH INTRAMUCOSAL LYMPHOID AGGREGATES. - NO EVIDENCE OF ADENOMATOUS CHANGES OR MALIGNANCY. 2. Duodenum, NOS biopsy - BENIGN SMALL BOWEL MUCOSA. NO VILLOUS ATROPHY, INFLAMMATION OR OTHER ABNORMALITIES PRESENT. 3. Stomach, biopsy - BENIGN GASTRIC BODY AND ANTRAL TYPE MUCOSA WITH SURFACE EROSION. - NO EVIDENCE OF HELICOBACTER PYLORI, INTESTINAL METAPLASIA, DYSPLASIA OR MALIGNANCY.    IMPRESSION:  #1. 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 #2. Gastroesophageal reflux disease, on treatment. #3. Hypothyroidism, on treatment. #4. Allergic rhinitis, on treatment. #5. Hypertension, controlled. #6. Hypercholesterolemia, on treatment. #7. Diabetes mellitus, type II, non-insulin requiring. #8. Gout, controlled with culture seen.   PLAN:  #1. Intravenous Feraheme on 08/27/2014 and 09/03/2014. #2. May discontinue oral iron. #3. Follow-up in 6 weeks with CBC, ferritin.  I appreciate the opportunity of sharing in her care.   Doroteo Bradford, MD 08/26/2014  6:51 AM   DISCLAIMER:  This note was dictated with voice recognition softwre.  Similar sounding words can inadvertently be transcribed inaccurately and may not be corrected upon review.

## 2014-08-26 LAB — INTRINSIC FACTOR ANTIBODIES: Intrinsic Factor: NEGATIVE

## 2014-08-27 ENCOUNTER — Encounter (HOSPITAL_BASED_OUTPATIENT_CLINIC_OR_DEPARTMENT_OTHER): Payer: 59

## 2014-08-27 DIAGNOSIS — E611 Iron deficiency: Secondary | ICD-10-CM

## 2014-08-27 DIAGNOSIS — D500 Iron deficiency anemia secondary to blood loss (chronic): Secondary | ICD-10-CM

## 2014-08-27 LAB — ANTI-PARIETAL ANTIBODY: Parietal Cell Antibody-IgG: NEGATIVE

## 2014-08-27 MED ORDER — SODIUM CHLORIDE 0.9 % IJ SOLN
10.0000 mL | INTRAMUSCULAR | Status: DC | PRN
  Administered 2014-08-27: 10 mL
  Filled 2014-08-27: qty 10

## 2014-08-27 MED ORDER — SODIUM CHLORIDE 0.9 % IV SOLN
Freq: Once | INTRAVENOUS | Status: AC
  Administered 2014-08-27: 13:00:00 via INTRAVENOUS

## 2014-08-27 MED ORDER — SODIUM CHLORIDE 0.9 % IV SOLN
510.0000 mg | Freq: Once | INTRAVENOUS | Status: AC
  Administered 2014-08-27: 510 mg via INTRAVENOUS
  Filled 2014-08-27: qty 17

## 2014-08-27 NOTE — Progress Notes (Signed)
Joanna [name] Tolerated iron infusion well today.  Discharged ambulatory

## 2014-08-27 NOTE — Patient Instructions (Signed)
Feraheme(iron infusion) today. Return for dose # 2 next Thursday

## 2014-09-03 ENCOUNTER — Encounter (HOSPITAL_BASED_OUTPATIENT_CLINIC_OR_DEPARTMENT_OTHER): Payer: 59

## 2014-09-03 ENCOUNTER — Encounter (HOSPITAL_COMMUNITY): Payer: Self-pay

## 2014-09-03 DIAGNOSIS — Z79899 Other long term (current) drug therapy: Secondary | ICD-10-CM

## 2014-09-03 DIAGNOSIS — D508 Other iron deficiency anemias: Secondary | ICD-10-CM

## 2014-09-03 DIAGNOSIS — E611 Iron deficiency: Secondary | ICD-10-CM

## 2014-09-03 DIAGNOSIS — D500 Iron deficiency anemia secondary to blood loss (chronic): Secondary | ICD-10-CM

## 2014-09-03 MED ORDER — FERUMOXYTOL INJECTION 510 MG/17 ML
510.0000 mg | Freq: Once | INTRAVENOUS | Status: AC
  Administered 2014-09-03: 510 mg via INTRAVENOUS
  Filled 2014-09-03: qty 17

## 2014-09-03 MED ORDER — SODIUM CHLORIDE 0.9 % IV SOLN
Freq: Once | INTRAVENOUS | Status: AC
  Administered 2014-09-03: 14:00:00 via INTRAVENOUS

## 2014-09-03 NOTE — Progress Notes (Signed)
Patient tolerated infusion well.

## 2014-09-03 NOTE — Patient Instructions (Signed)
[facility] Discharge Instructions  RECOMMENDATIONS MADE BY THE CONSULTANT AND ANY TEST RESULTS WILL BE SENT TO YOUR REFERRING PHYSICIAN. Today your were given Feraheme. Please call for any questions or concerns. Follow up as scheduled.   Thank you for choosing [facility] to provide your oncology and hematology care.  To afford each patient quality time with our providers, please arrive at least 15 minutes before your scheduled appointment time.  With your help, our goal is to use those 15 minutes to complete the necessary work-up to ensure our physicians have the information they need to help with your evaluation and healthcare recommendations.    Effective January 1st, 2014, we ask that you re-schedule your appointment with our physicians should you arrive 10 or more minutes late for your appointment.  We strive to give you quality time with our providers, and arriving late affects you and other patients whose appointments are after yours.    Again, thank you for choosing [facility].  Our hope is that these requests will decrease the amount of time that you wait before being seen by our physicians.       _____________________________________________________________  Should you have questions after your visit to [facility], please contact our office at [phone] between the hours of 8:30 a.m. and 4:30 p.m.  Voicemails left after 4:30 p.m. will not be returned until the following business day.  For prescription refill requests, have your pharmacy contact our office with your prescription refill request.    _______________________________________________________________  We hope that we have given you very good care.  You may receive a patient satisfaction survey in the mail, please complete it and return it as soon as possible.  We value your feedback!  _______________________________________________________________  Have you  asked about our STAR program?  STAR stands for Survivorship Training and Rehabilitation, and this is a nationally recognized cancer care program that focuses on survivorship and rehabilitation.  Cancer and cancer treatments may cause problems, such as, pain, making you feel tired and keeping you from doing the things that you need or want to do. Cancer rehabilitation can help. Our goal is to reduce these troubling effects and help you have the best quality of life possible.  You may receive a survey from a nurse that asks questions about your current state of health.  Based on the survey results, all eligible patients will be referred to the [facility] program for an evaluation so we can better serve you!  A frequently asked questions sheet is available upon request.

## 2014-09-21 ENCOUNTER — Encounter: Payer: Self-pay | Admitting: Orthopedic Surgery

## 2014-09-24 ENCOUNTER — Other Ambulatory Visit: Payer: Self-pay | Admitting: Family Medicine

## 2014-10-01 ENCOUNTER — Ambulatory Visit (INDEPENDENT_AMBULATORY_CARE_PROVIDER_SITE_OTHER): Payer: 59 | Admitting: Orthopedic Surgery

## 2014-10-01 ENCOUNTER — Encounter: Payer: Self-pay | Admitting: Orthopedic Surgery

## 2014-10-01 ENCOUNTER — Ambulatory Visit (INDEPENDENT_AMBULATORY_CARE_PROVIDER_SITE_OTHER): Payer: 59

## 2014-10-01 VITALS — BP 117/66 | Ht 62.0 in | Wt 197.8 lb

## 2014-10-01 DIAGNOSIS — M75101 Unspecified rotator cuff tear or rupture of right shoulder, not specified as traumatic: Secondary | ICD-10-CM

## 2014-10-01 DIAGNOSIS — M25511 Pain in right shoulder: Secondary | ICD-10-CM

## 2014-10-01 NOTE — Progress Notes (Signed)
Patient ID: Joanna [name], female   DOB: [birth year], 63 y.o.   MRN: [id number]  Chief Complaint  Patient presents with  . Shoulder Pain    right shoulder pain with radicular pain down right arm to elbow, no known injury REFER SCOTT [name]     Joanna [name] is a 63 y.o. female who presents with right shoulder pain radiating into the upper right arm to the elbow but not below denies numbness or tingling in the right hand. She reached for something thought she felt some pain in her right shoulder about 4 months ago and has continued pain since then. As a side note she also complains of occasional triggering in the right ring finger.  Shoulder pain is described as sharp aching constant 9 out of 10 associated with for elevation loss of motion and using her right arm. Unrelieved by Tylenol and hydrocodone 10 mg. Patient was taken off anti-inflammatories in the past for bleeding and is on iron supplementation will have another check on that soon  Medical history has been reviewed.   Review of Systems Review of Systems Review of systems fatigue sinus problems vision disturbance depression anxiety joint pain back pain chronic. Seasonal allergies constipation all other systems reviewed were negative    Past Medical History  Diagnosis Date  . GERD (gastroesophageal reflux disease)   . Diabetes mellitus   . Hypertension   . Depression   . Insomnia   . Diverticulosis   . Thyroid disease     hypothyroid  . Sleep apnea   . Dyslipidemia   . Microproteinuria   . Renal insufficiency 2010    Past Surgical History  Procedure Laterality Date  . Total abdominal hysterectomy w/ bilateral salpingoophorectomy    . Knee arthroscopy Right   . Esophagogastroduodenoscopy   01/29/2012    Fields-2-3cm sliding HH, fundic gland polyps, NEGATIVE H pylori  . Colonoscopy  01/29/12    Fields-2-3 hyperplastic polyps, mod internal hemorrhoids, diverticulosis throughout colon  . Back surgery    . Abdominal  hysterectomy  1984  . Bilateral oophorectomy    . Colonoscopy N/A 07/03/2014    Procedure: COLONOSCOPY;  Surgeon: [name], MD;  [facility];  Service: Endoscopy;  Laterality: N/A;  230-moved to 8:30 - Ginger notified pt  . Esophagogastroduodenoscopy N/A 07/03/2014    Procedure: ESOPHAGOGASTRODUODENOSCOPY (EGD);  Surgeon: [name], MD;  [facility]: [facility];  Service: Endoscopy;  Laterality: N/A;    Family History  Problem Relation Age of Onset  . Cardiomyopathy Father   . Heart failure Mother   . Hyperlipidemia Mother   . Stroke Brother   . Cancer Brother     renal  . Kidney cancer Brother   . Diabetes Paternal Uncle   . Heart disease Paternal Grandmother     Social History History  Substance Use Topics  . Smoking status: Former Smoker -- 0.50 packs/day for 30 years    Types: Cigarettes    Quit date: 10/16/2010  . Smokeless tobacco: Former Systems developer    Quit date: 10/28/2010  . Alcohol Use: Yes     Comment: once per mo, socially    Allergies  Allergen Reactions  . Ivp Dye [Iodinated Diagnostic Agents]     Current Outpatient Prescriptions  Medication Sig Dispense Refill  . ALPRAZolam (XANAX) 0.5 MG tablet 1/2 to 1 po BID prn anxiety 30 tablet 2  . colchicine 0.6 MG tablet Take 0.6 mg by mouth daily as needed (gout).    Marland Kitchen  doxazosin (CARDURA) 2 MG tablet Take 2 mg by mouth daily.    . fexofenadine (ALLEGRA) 180 MG tablet Take 180 mg by mouth daily.    . fluticasone (FLONASE) 50 MCG/ACT nasal spray Place 2 sprays into the nose as needed. 16 g 12  . glipiZIDE (GLUCOTROL) 5 MG tablet TAKE 1 TABLET TWICE A DAY BEFORE A MEAL 180 tablet 0  . HYDROcodone-acetaminophen (NORCO) 10-325 MG per tablet Take 1 tablet by mouth every 4 (four) hours as needed. 60 tablet 0  . indapamide (LOZOL) 2.5 MG tablet Take 1 tablet (2.5 mg total) by mouth daily. 90 tablet 6  . levothyroxine (SYNTHROID, LEVOTHROID) 88 MCG tablet Take 1 tablet (88 mcg total) by mouth daily. 30  tablet 5  . losartan (COZAAR) 100 MG tablet TAKE 1 TABLET BY MOUTH EVERY DAY 90 tablet 1  . Magnesium 250 MG TABS Take 1 tablet by mouth daily.     Marland Kitchen omeprazole (PRILOSEC) 40 MG capsule TAKE 1 CAPSULE EVERY DAY 90 capsule 1  . pravastatin (PRAVACHOL) 20 MG tablet TAKE 1 TABLET EVERY DAY 90 tablet 1  . traZODone (DESYREL) 100 MG tablet 0.5 to one tablet at bedtime 30 tablet 6   No current facility-administered medications for this visit.       Physical Exam Blood pressure 117/66, height 5\' 2"  (1.575 m), weight 197 lb 12.8 oz (89.721 kg). Physical Exam  Objective:    General:  alert, cooperative, appears stated age, no distress and Normal grooming normal hygiene  Gait:  Normal.   The patient is oriented to person place and time and the mood and affect are normal   Cervical spine exam no palpable tenderness slight decreased range of motion normal muscle tone and skin  Lumbar spine mild tenderness decreased range of motion no increase in muscle tension normal skin except for surgical incision which is healed.  Lower extremities no contracture subluxation atrophy or tremor skin normal  Lymph nodes are negative in the upper extremities axillary regions and cervical regions.  2+ symmetric reflexes are noted in the upper extremities and coordination is normal as well.  Left shoulder full range of motion apprehension negative cuff strength grade 5 skin normal no palpable tenderness or swelling  Right shoulder diffuse palpable tenderness around the acromion upper arm decreased active range of motion painful external rotation 40 elevation positive impingement sign negative apprehension sign negative drop test skin intact again good pulses and sensation and no lymphadenopathy SKIN normal times   Data Reviewed My interpretation of the xrays: Normal x-ray today  Assessment Encounter Diagnoses  Name Primary?  . Right shoulder pain Yes  . Rotator cuff syndrome, right      Plan Subacromial injection Physical therapy Return if symptoms persist after therapy   Procedure note the subacromial injection shoulder RT  Verbal consent was obtained to inject the  RT  Shoulder  Timeout was completed to confirm the injection site is a subacromial space of the  RT shoulder   Medication used Depo-Medrol 40 mg and lidocaine 1% 3 cc  Anesthesia was provided by ethyl chloride  The injection was performed in the RT posterior subacromial space. After pinning the skin with alcohol and anesthetized the skin with ethyl chloride the subacromial space was injected using a 20-gauge needle. There were no complications  Sterile dressing was applied.

## 2014-10-01 NOTE — Patient Instructions (Addendum)
Call [facility] therapy dept to schedule therapy  Joint Injection Care After Refer to this sheet in the next few days. These instructions provide you with information on caring for yourself after you have had a joint injection. Your caregiver also may give you more specific instructions. Your treatment has been planned according to current medical practices, but problems sometimes occur. Call your caregiver if you have any problems or questions after your procedure. After any type of joint injection, it is not uncommon to experience:  Soreness, swelling, or bruising around the injection site.  Mild numbness, tingling, or weakness around the injection site caused by the numbing medicine used before or with the injection. It also is possible to experience the following effects associated with the specific agent after injection:  Iodine-based contrast agents:  Allergic reaction (itching, hives, widespread redness, and swelling beyond the injection site).  Corticosteroids (These effects are rare.):  Allergic reaction.  Increased blood sugar levels (If you have diabetes and you notice that your blood sugar levels have increased, notify your caregiver).  Increased blood pressure levels.  Mood swings.  Hyaluronic acid in the use of viscosupplementation.  Temporary heat or redness.  Temporary rash and itching.  Increased fluid accumulation in the injected joint. These effects all should resolve within a day after your procedure.  HOME CARE INSTRUCTIONS  Limit yourself to light activity the day of your procedure. Avoid lifting heavy objects, bending, stooping, or twisting.  Take prescription or over-the-counter pain medication as directed by your caregiver.  You may apply ice to your injection site to reduce pain and swelling the day of your procedure. Ice may be applied 03-04 times:  Put ice in a plastic bag.  Place a towel between your skin and the bag.  Leave the ice on for no longer  than 15-20 minutes each time. SEEK IMMEDIATE MEDICAL CARE IF:   Pain and swelling get worse rather than better or extend beyond the injection site.  Numbness does not go away.  Blood or fluid continues to leak from the injection site.  You have chest pain.  You have swelling of your face or tongue.  You have trouble breathing or you become dizzy.  You develop a fever, chills, or severe tenderness at the injection site that last longer than 1 day. MAKE SURE YOU:  Understand these instructions.  Watch your condition.  Get help right away if you are not doing well or if you get worse. Document Released: 06/15/2011 Document Revised: 12/25/2011 Document Reviewed: 06/15/2011 [facility] Patient Information 2015 [place], [place]. This information is not intended to replace advice given to you by your health care provider. Make sure you discuss any questions you have with your health care provider.

## 2014-10-05 ENCOUNTER — Encounter (HOSPITAL_COMMUNITY): Payer: Self-pay

## 2014-10-05 ENCOUNTER — Ambulatory Visit (HOSPITAL_COMMUNITY)
Admission: RE | Admit: 2014-10-05 | Discharge: 2014-10-05 | Disposition: A | Discharge status: Home / Self Care | Payer: 59 | Source: Ambulatory Visit | Attending: Family Medicine | Admitting: Family Medicine

## 2014-10-05 ENCOUNTER — Other Ambulatory Visit: Payer: Self-pay | Admitting: Family Medicine

## 2014-10-05 ENCOUNTER — Ambulatory Visit (HOSPITAL_COMMUNITY): Payer: 59 | Admitting: Hematology & Oncology

## 2014-10-05 DIAGNOSIS — Z5189 Encounter for other specified aftercare: Secondary | ICD-10-CM | POA: Diagnosis not present

## 2014-10-05 DIAGNOSIS — M25611 Stiffness of right shoulder, not elsewhere classified: Secondary | ICD-10-CM | POA: Insufficient documentation

## 2014-10-05 DIAGNOSIS — M25511 Pain in right shoulder: Secondary | ICD-10-CM | POA: Diagnosis not present

## 2014-10-05 DIAGNOSIS — R29898 Other symptoms and signs involving the musculoskeletal system: Secondary | ICD-10-CM

## 2014-10-05 NOTE — Patient Instructions (Signed)
SHOULDER: Flexion On Table   Place hands on table, elbows straight. Gently lean forward, stretching arms forward, before sitting tall again.  Repeat for 1-2 minutes, 2-3 times per day.   Abduction (Passive)   With arm out to side, resting on table, lean to the side toward the table, making sure to keep body facing forward.  Lean tall again.  Repeat for 1-2 minutes, 2-3 times per day.    Copyright  VHI. All rights reserved.     Internal Rotation (Assistive)   Seated with elbow bent at right angle and held against side, slide arm on table surface in an inward arc, and back outward again. Repeat for 1-2 minutes, 2-3 times per day.   Activity: Use this motion to brush crumbs off the table.  Copyright  VHI. All rights reserved.     Bea Graff Vernette Moise, MS, OTR/L [place]

## 2014-10-05 NOTE — Therapy (Signed)
[place], [place], [address] Phone: [phone]   Fax:  [phone]  Occupational Therapy Evaluation  Patient Details  Name: KIRTI MOYNAHAN MRN: [id number] Date of Birth: 1951/09/11  Encounter Date: 10/05/2014      OT End of Session - 10/05/14 1607    Visit Number 1   Number of Visits 24   Date for OT Re-Evaluation 11/02/14   Authorization Type [employer]   Authorization Time Period 46 visits   Authorization - Visit Number 1   Authorization - Number of Visits 29   OT Start Time 1350   OT Stop Time 1425   OT Time Calculation (min) 35 min   Activity Tolerance Patient tolerated treatment well   Behavior During Therapy [facility] for tasks assessed/performed      Past Medical History  Diagnosis Date  . GERD (gastroesophageal reflux disease)   . Diabetes mellitus   . Hypertension   . Depression   . Insomnia   . Diverticulosis   . Thyroid disease     hypothyroid  . Sleep apnea   . Dyslipidemia   . Microproteinuria   . Renal insufficiency 2010    Past Surgical History  Procedure Laterality Date  . Total abdominal hysterectomy w/ bilateral salpingoophorectomy    . Knee arthroscopy Right   . Esophagogastroduodenoscopy   01/29/2012    Fields-2-3cm sliding HH, fundic gland polyps, NEGATIVE H pylori  . Colonoscopy  01/29/12    Fields-2-3 hyperplastic polyps, mod internal hemorrhoids, diverticulosis throughout colon  . Back surgery    . Abdominal hysterectomy  1984  . Bilateral oophorectomy    . Colonoscopy N/A 07/03/2014    Procedure: COLONOSCOPY;  Surgeon: [name], MD;  [facility];  Service: Endoscopy;  Laterality: N/A;  230-moved to 8:30 - Ginger notified pt  . Esophagogastroduodenoscopy N/A 07/03/2014    Procedure: ESOPHAGOGASTRODUODENOSCOPY (EGD);  Surgeon: [name], MD;  [facility];  Service: Endoscopy;  Laterality: N/A;    There were no vitals taken for this visit.  Visit  Diagnosis:  Pain in joint, shoulder region, right  Decreased range of motion of shoulder, right  Weakness of shoulder      Subjective Assessment - 10/05/14 1425    Symptoms "i don't know what i did. It just started hurting. But today is the first day since that shot that its really felt good!"   Pertinent History Pt is 63 yo female presenting to outpatient OT with rotator cuff syndrome of her right shoulder.  pt began exerpeincing increased pain about 4 months ago, which got progressively worse and began hindering her ADL and IADL engagement.  Pt began having increased pain with all tasks involving RUE.  pt with to Dr. Wolfgang Phoenix in October, who reccommended ROM exercises, and had an appointment with Dr. Aline Brochure on thursday 12/17, where she received a cortisone injection.  the injection had helped significanly with pain and ROM.   Limitations Progress as tolerated   Special Tests FOTO 31/100   Currently in Pain? No/denies  10/10 average prior to cortisone injection, with all activites          [facility] OT Assessment - 10/05/14 1431    Assessment   Diagnosis Right Rotator Cuff Syndrome   Onset Date 06/05/14   Prior Therapy "all over my body"   Precautions   Precautions None   Restrictions   Weight Bearing Restrictions No   Balance Screen   Has  the patient fallen in the past 6 months No   Has the patient had a decrease in activity level because of a fear of falling?  No   Is the patient reluctant to leave their home because of a fear of falling?  No   Home  Environment   Family/patient expects to be discharged to: Private residence   Living Arrangements Alone   Prior Function   Level of Independence Independent with basic ADLs;Independent with homemaking with ambulation   Vocation Retired   [employer] Previously worked for [employer], [employer] reading, cooking   ADL   ADL comments pt reports increased difficulty with all ADL and IADL tasks.  particulary mixing  and reaching into cabinets when in the kitchen   Written Expression   Dominant Hand Right   Vision - History   Baseline Vision Wears glasses only for reading   Cognition   Overall Cognitive Status Within Functional Limits for tasks assessed   Observation/Other Assessments   Observations pt has moderate fascial restrictions and moderate tenderness in RUE upper arm, upper bicep, upper trap, and scapular regions.   Focus on Therapeutic Outcomes (FOTO)  FOTO 31/100   Sensation   Light Touch Appears Intact   Additional Comments Pt reports tingling over the past month in RUE fingertips   Coordination   Gross Motor Movements are Fluid and Coordinated Yes   Fine Motor Movements are Fluid and Coordinated Yes   AROM   Overall AROM  Deficits   Overall AROM Comments Assessed in sitting, with ER/IR adducted   Right Shoulder Flexion 88 Degrees  with 8/10 pain   Right Shoulder ABduction 123 Degrees   Right Shoulder Internal Rotation 94 Degrees   Right Shoulder External Rotation 40 Degrees   PROM   Overall PROM  Deficits   Overall PROM Comments Assessed in supine, with ER/IR adducted   Right Shoulder Flexion 112 Degrees   Right Shoulder ABduction 164 Degrees   Right Shoulder Internal Rotation 98 Degrees   Right Shoulder External Rotation 38 Degrees            OT Education - 10/05/14 1606    Education provided Yes   Education Details Table Slides HEP   Person(s) Educated Patient   Methods Explanation;Demonstration;Handout   Comprehension Verbalized understanding          OT Short Term Goals - 10/05/14 1612    OT SHORT TERM GOAL #1   Title Pt will be educated on HEP   Time 4   Period Weeks   Status New   OT SHORT TERM GOAL #2   Title Pt will improve RUE PROM to [facility] for improved ability to reach into kitchen cabinets.   Time 4   Period Weeks   Status New   OT SHORT TERM GOAL #3   Title Pt will imporve fascial restrictions in RUE to minimal, for decreased pain with motion    Time 4   Period Weeks   Status New   OT SHORT TERM GOAL #4   Title Pt will have pain of less than 5/10 when engaging in reaching tasks.   Time 4   Period Weeks   Status New           OT Long Term Goals - 10/05/14 1616    OT LONG TERM GOAL #1   Title Pt will achieve highest level of functioning in all ADL, IADL, and leisure tasks.   Time 8  Period Weeks   Status New   OT LONG TERM GOAL #2   Title Pt will improve RUE AROM to atleast [facility] for improved ability to get mixing bowls out of cabinets.   Time 8   Period Weeks   Status New   OT LONG TERM GOAL #3   Title pt will improve strength in RUE to atleast 4+/5 for improved ability to mix batters in the kitchen   Time 8   Period Weeks   Status New   OT LONG TERM GOAL #4   Title pt will decrease fascial restrictions to trace, for decreased pain with motion   Time 8   Period Weeks   Status New   OT LONG TERM GOAL #5   Title Pt will have pain in RUE of less than 3/10 when engaged in reaching tasks   Time 8   Period Weeks   Status New               Plan - 10/05/14 1608    Clinical Impression Statement Pt is presenting to outpatient OT services with R rotator cuff syndrome.  Pt has increased pain, decreased ROM, increased fascial restrictions, decreased strength, and decreased overall RUE functional use, leading to ADL and IADL deficits.   Rehab Potential Good   OT Frequency 3x / week   OT Duration 8 weeks   OT Treatment/Interventions Self-care/ADL training;Therapeutic exercise;Patient/family education;Manual Therapy;Therapeutic exercises;Therapeutic activities;Moist Heat;Cryotherapy;Passive range of motion   Plan Pt will benefit from skilled OT services to improve ROM, decrease fascial restrictions, decrase pain, promote inproved strength, and improve overall RUE functional use.      Treatment Plan: Myofascial release (MFR) and manual stretching, PROM, AAROM, AROM, general strengthening, proximal shoudler strengthening,  scapular strengthening, HEP updates   OT Home Exercise Plan Table Slides 12/21   Consulted and Agree with Plan of Care Patient        Problem List Patient Active Problem List   Diagnosis Date Noted  . Iron deficiency 08/25/2014  . Microcytic anemia 06/17/2014  . Insomnia 05/04/2014  . Hypothyroidism 06/05/2013  . Renal insufficiency 06/05/2013  . Essential hypertension, benign 06/05/2013  . Stress fracture of right foot 02/27/2013  . Chronic radicular lumbar pain 02/27/2013  . Other and unspecified hyperlipidemia 01/16/2013  . Type 2 diabetes mellitus with HbA1C goal below 7.5 01/16/2013  . GERD (gastroesophageal reflux disease) 12/27/2011    Bea Graff Vitor Overbaugh, MS, OTR/L [place] [phone] 10/05/2014, 4:22 PM  [place] [address] [place], [place], [address] Phone: [phone]   Fax:  [phone]

## 2014-10-06 ENCOUNTER — Encounter (HOSPITAL_COMMUNITY): Payer: Self-pay

## 2014-10-06 ENCOUNTER — Ambulatory Visit (HOSPITAL_COMMUNITY)
Admission: RE | Admit: 2014-10-06 | Discharge: 2014-10-06 | Disposition: A | Discharge status: Home / Self Care | Payer: 59 | Source: Ambulatory Visit | Attending: Family Medicine | Admitting: Family Medicine

## 2014-10-06 DIAGNOSIS — M25611 Stiffness of right shoulder, not elsewhere classified: Secondary | ICD-10-CM

## 2014-10-06 DIAGNOSIS — R29898 Other symptoms and signs involving the musculoskeletal system: Secondary | ICD-10-CM

## 2014-10-06 DIAGNOSIS — Z5189 Encounter for other specified aftercare: Secondary | ICD-10-CM | POA: Diagnosis not present

## 2014-10-06 DIAGNOSIS — M25511 Pain in right shoulder: Secondary | ICD-10-CM

## 2014-10-06 NOTE — Progress Notes (Signed)
Joanna Lange, MD [place] [address]  Iron deficiency anemia Pagophagia Colonoscopy 07/03/2014 with severe diverticulosis/polyps EGD 07/03/2014 with moderate non-erosive gastritis Capsule Endoscopy scheduled 08/04/2014 (patient cancelled)  CURRENT THERAPY: Joanna [name] 08/2014  INTERVAL HISTORY: Joanna [name] 63 y.o. female returns for iron deficiency anemia. She has noticed a definite change in her pagophagia since iron replacement. She had a capsule endoscopy scheduled but cancelled because she did not have a ride.  She did not reschedule.  This time of the year is difficult for her as she has lost many relatives during the Holidays.  Her back bothers her, that is chronic. She has had surgery and "injections." She is overdue on her mammogram.  MEDICAL HISTORY: Past Medical History  Diagnosis Date  . GERD (gastroesophageal reflux disease)   . Diabetes mellitus   . Hypertension   . Depression   . Insomnia   . Diverticulosis   . Thyroid disease     hypothyroid  . Sleep apnea   . Dyslipidemia   . Microproteinuria   . Renal insufficiency 2010    has GERD (gastroesophageal reflux disease); Other and unspecified hyperlipidemia; Type 2 diabetes mellitus with HbA1C goal below 7.5; Stress fracture of right foot; Chronic radicular lumbar pain; Hypothyroidism; Renal insufficiency; Essential hypertension, benign; Insomnia; Microcytic anemia; and Iron deficiency on her problem list.     No history exists.     is allergic to ivp dye.  Joanna [name] does not currently have medications on file.  SURGICAL HISTORY: Past Surgical History  Procedure Laterality Date  . Total abdominal hysterectomy w/ bilateral salpingoophorectomy    . Knee arthroscopy Right   . Esophagogastroduodenoscopy   01/29/2012    Fields-2-3cm sliding HH, fundic gland polyps, NEGATIVE H pylori  . Colonoscopy  01/29/12    Fields-2-3 hyperplastic polyps, mod internal hemorrhoids, diverticulosis  throughout colon  . Back surgery    . Abdominal hysterectomy  1984  . Bilateral oophorectomy    . Colonoscopy N/A 07/03/2014    Procedure: COLONOSCOPY;  Surgeon: [name], MD;  [facility];  Service: Endoscopy;  Laterality: N/A;  230-moved to 8:30 - Ginger notified pt  . Esophagogastroduodenoscopy N/A 07/03/2014    Procedure: ESOPHAGOGASTRODUODENOSCOPY (EGD);  Surgeon: [name], MD;  [facility];  Service: Endoscopy;  Laterality: N/A;    SOCIAL HISTORY: History   Social History  . Marital Status: Divorced    Spouse Name: N/A    Number of Children: 2  . 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 Smoking status: Former Smoker -- 0.50 packs/day for 30 years    Types: Cigarettes    Quit date: 10/16/2010  . Smokeless tobacco: Former Systems developer    Quit date: 10/28/2010  . Alcohol Use: Yes     Comment: once per mo, socially  . Drug Use: No  . Sexual Activity: Not on file   Other Topics Concern  . Not on file   Social History Narrative    FAMILY HISTORY: Family History  Problem Relation Age of Onset  . Cardiomyopathy Father   . Heart failure Mother   . Hyperlipidemia Mother   . Stroke Brother   . Cancer Brother     renal  . Kidney cancer Brother   . Diabetes Paternal Uncle   . Heart disease Paternal Grandmother     Review of Systems  Constitutional: Negative  for fever, chills, weight loss, malaise/fatigue and diaphoresis.  HENT: Negative for congestion, hearing loss, nosebleeds, sore throat and tinnitus.   Eyes: Negative.   Respiratory: Negative.  Negative for stridor.   Cardiovascular: Negative.   Gastrointestinal: Negative for heartburn, nausea, vomiting, abdominal pain, diarrhea, constipation, blood in stool and melena.  Genitourinary: Negative.   Musculoskeletal: Positive for back pain and joint pain. Negative for myalgias, falls and neck pain.  Skin: Negative for itching and rash.    Neurological: Positive for tingling and headaches. Negative for dizziness, tremors, sensory change, speech change, focal weakness, seizures, loss of consciousness and weakness.       Tingling in R hand from rotator cuff syndrome  Endo/Heme/Allergies: Negative.   Psychiatric/Behavioral: Negative.     PHYSICAL EXAMINATION  ECOG PERFORMANCE STATUS: 0 - Asymptomatic  Filed Vitals:   10/07/14 1506  BP: 121/83  Pulse: 97  Temp: 97.8 F (36.6 C)  Resp: 18    Physical Exam  Constitutional: She is oriented to person, place, and time and well-developed, well-nourished, and in no distress.  HENT:  Head: Normocephalic and atraumatic.  Nose: Nose normal.  Mouth/Throat: Oropharynx is clear and moist. No oropharyngeal exudate.  Eyes: Conjunctivae and EOM are normal. Pupils are equal, round, and reactive to light. Right eye exhibits no discharge. Left eye exhibits no discharge. No scleral icterus.  Neck: Normal range of motion. Neck supple. No JVD present. No tracheal deviation present. No thyromegaly present.  Cardiovascular: Normal rate, regular rhythm and normal heart sounds.   No murmur heard. Pulmonary/Chest: Effort normal and breath sounds normal. No stridor. No respiratory distress. She has no wheezes. She has no rales. She exhibits no tenderness.  Abdominal: Soft. Bowel sounds are normal. She exhibits no distension and no mass. There is no tenderness. There is no rebound and no guarding.  Musculoskeletal: Normal range of motion. She exhibits no edema.  Lymphadenopathy:    She has no cervical adenopathy.  Neurological: She is alert and oriented to person, place, and time. No cranial nerve deficit. Coordination normal.  Skin: Skin is warm and dry. She is not diaphoretic.  Psychiatric: Mood, memory, affect and judgment normal.    LABORATORY DATA:  CBC    Component Value Date/Time   WBC 7.1 10/07/2014 1530   RBC 4.27 10/07/2014 1530   HGB 12.0 10/07/2014 1530   HCT 37.0  10/07/2014 1530   PLT 282 10/07/2014 1530   MCV 86.7 10/07/2014 1530   MCH 28.1 10/07/2014 1530   MCHC 32.4 10/07/2014 1530   RDW 16.0* 10/07/2014 1530   LYMPHSABS 2.3 10/07/2014 1530   MONOABS 0.4 10/07/2014 1530   EOSABS 0.1 10/07/2014 1530   BASOSABS 0.0 10/07/2014 1530   CMP     Component Value Date/Time   NA 140 10/07/2014 1530   K 3.7 10/07/2014 1530   CL 107 10/07/2014 1530   CO2 25 10/07/2014 1530   GLUCOSE 111* 10/07/2014 1530   BUN 26* 10/07/2014 1530   CREATININE 1.39* 10/07/2014 1530   CREATININE 1.41* 08/06/2014 1029   CALCIUM 9.8 10/07/2014 1530   PROT 6.2 04/28/2014 1031   ALBUMIN 3.9 04/28/2014 1031   AST 22 04/28/2014 1031   ALT 19 04/28/2014 1031   ALKPHOS 86 04/28/2014 1031   BILITOT 0.3 04/28/2014 1031   GFRNONAA 40* 10/07/2014 1530   GFRAA 46* 10/07/2014 1530     PENDING LABS: Ferritin CBC Iron studies   ASSESSMENT and THERAPY PLAN:    Iron deficiency 63 year old female  with iron deficiency anemia. Counts are improved after iron replacement. She notices that her pagophagia is also decreased. She has to reschedule her capsule endoscopy as she had to cancel the procedure but did not reschedule. Her remaining GI evaluation is complete. We addressed causes of iron deficiency including malabsorption and GI related losses. We will call her with the results of her ferritin when available and advise if she needs additional replacement.  I will see her back in 3 months with labs including CBC, ferritin and iron studies. Sooner should she have any problems or concerns prior.  Microcytic anemia Her counts are markedly improved. Microcytosis has resolved.  She may still need additional iron to completely replete her iron stores. She will be notified of ferritin when available.  If her H/H do not completely normalize prior to f/u labs we will investigate further then. I anticipate however given her current H/H and RDW that she still has ongoing iron  requirements.    All questions were answered. The patient knows to call the clinic with any problems, questions or concerns. We can certainly see the patient much sooner if necessary.  Molli Hazard 10/11/2014

## 2014-10-06 NOTE — Therapy (Signed)
[place], [place], [address] Phone: [phone]   Fax:  [phone]  Occupational Therapy Treatment  Patient Details  Name: Joanna [name] MRN: [id number] Date of Birth: 1951/09/13  Encounter Date: 10/06/2014      OT End of Session - 10/06/14 1158    Visit Number 2   Number of Visits 24   Date for OT Re-Evaluation 11/02/14   Authorization Type UHC   Authorization Time Period 65 visits   Authorization - Visit Number 2   Authorization - Number of Visits 29   OT Start Time 1017   OT Stop Time 1055   OT Time Calculation (min) 38 min   Behavior During Therapy [facility] for tasks assessed/performed      Past Medical History  Diagnosis Date  . GERD (gastroesophageal reflux disease)   . Diabetes mellitus   . Hypertension   . Depression   . Insomnia   . Diverticulosis   . Thyroid disease     hypothyroid  . Sleep apnea   . Dyslipidemia   . Microproteinuria   . Renal insufficiency 2010    Past Surgical History  Procedure Laterality Date  . Total abdominal hysterectomy w/ bilateral salpingoophorectomy    . Knee arthroscopy Right   . Esophagogastroduodenoscopy   01/29/2012    Fields-2-3cm sliding HH, fundic gland polyps, NEGATIVE H pylori  . Colonoscopy  01/29/12    Fields-2-3 hyperplastic polyps, mod internal hemorrhoids, diverticulosis throughout colon  . Back surgery    . Abdominal hysterectomy  1984  . Bilateral oophorectomy    . Colonoscopy N/A 07/03/2014    Procedure: COLONOSCOPY;  Surgeon: [name], MD;  [facility];  Service: Endoscopy;  Laterality: N/A;  230-moved to 8:30 - Ginger notified pt  . Esophagogastroduodenoscopy N/A 07/03/2014    Procedure: ESOPHAGOGASTRODUODENOSCOPY (EGD);  Surgeon: [name], MD;  [facility];  Service: Endoscopy;  Laterality: N/A;    There were no vitals taken for this visit.  Visit Diagnosis:  Pain in joint, shoulder region,  right  Decreased range of motion of shoulder, right  Weakness of shoulder      Subjective Assessment - 10/06/14 1017    Symptoms "I tried some of thiem last night (the stretches), so much was going on."   Currently in Pain? No/denies          [facility] OT Assessment - 10/05/14 1431    Assessment   Diagnosis Right Rotator Cuff Syndrome   Onset Date 06/05/14   Prior Therapy "all over my body"   Precautions   Precautions None   Restrictions   Weight Bearing Restrictions No   Balance Screen   Has the patient fallen in the past 6 months No   Has the patient had a decrease in activity level because of a fear of falling?  No   Is the patient reluctant to leave their home because of a fear of falling?  No   Home  Environment   Family/patient expects to be discharged to: Private residence   Living Arrangements Alone   Prior Function   Level of Independence Independent with basic ADLs;Independent with homemaking with ambulation   Vocation Retired   [employer] Previously worked for [employer], [employer] reading, cooking   ADL   ADL comments pt reports increased difficulty with all ADL and IADL tasks.  particulary mixing and reaching into cabinets when in the kitchen  Written Expression   Dominant Hand Right   Vision - History   Baseline Vision Wears glasses only for reading   Cognition   Overall Cognitive Status Within Functional Limits for tasks assessed   Observation/Other Assessments   Observations pt has moderate fascial restrictions and moderate tenderness in RUE upper arm, upper bicep, upper trap, and scapular regions.   Focus on Therapeutic Outcomes (FOTO)  FOTO 31/100   Sensation   Light Touch Appears Intact   Additional Comments Pt reports tingling over the past month in RUE fingertips   Coordination   Gross Motor Movements are Fluid and Coordinated Yes   Fine Motor Movements are Fluid and Coordinated Yes   AROM   Overall AROM  Deficits    Overall AROM Comments Assessed in sitting, with ER/IR adducted   Right Shoulder Flexion 88 Degrees  with 8/10 pain   Right Shoulder ABduction 123 Degrees   Right Shoulder Internal Rotation 94 Degrees   Right Shoulder External Rotation 40 Degrees   PROM   Overall PROM  Deficits   Overall PROM Comments Assessed in supine, with ER/IR adducted   Right Shoulder Flexion 112 Degrees   Right Shoulder ABduction 164 Degrees   Right Shoulder Internal Rotation 98 Degrees   Right Shoulder External Rotation 38 Degrees               OT Treatments/Exercises (OP) - 10/06/14 1019    Shoulder Exercises: Supine   Protraction PROM;10 reps   Horizontal ABduction PROM;10 reps   External Rotation PROM;10 reps   Internal Rotation PROM;10 reps   Flexion PROM;10 reps   ABduction PROM;10 reps   Shoulder Exercises: Seated   Elevation AROM;10 reps   Extension AROM;10 reps   Row AROM;10 reps   Shoulder Exercises: Therapy Ball   Flexion 10 reps   ABduction 10 reps   Shoulder Exercises: ROM/Strengthening   Thumb Tacks 1 min   Prot/Ret//Elev/Dep 1 min   Shoulder Exercises: Stretch   Corner Stretch 5 reps;10 seconds   Manual Therapy   Manual Therapy Myofascial release   Myofascial Release MFR and manual stretching to RUE bicep, upper arm, anterior shoulder, and scapular regions                OT Education - 10/05/14 1606    Education provided Yes   Education Details Table Slides HEP   Person(s) Educated Patient   Methods Explanation;Demonstration;Handout   Comprehension Verbalized understanding          OT Short Term Goals - 10/05/14 1612    OT SHORT TERM GOAL #1   Title Pt will be educated on HEP   Time 4   Period Weeks   Status New   OT SHORT TERM GOAL #2   Title Pt will improve RUE PROM to [facility] for improved ability to reach into kitchen cabinets.   Time 4   Period Weeks   Status New   OT SHORT TERM GOAL #3   Title Pt will imporve fascial restrictions in RUE to minimal,  for decreased pain with motion   Time 4   Period Weeks   Status New   OT SHORT TERM GOAL #4   Title Pt will have pain of less than 5/10 when engaging in reaching tasks.   Time 4   Period Weeks   Status New           OT Long Term Goals - 10/05/14 1616    OT LONG TERM GOAL #1  Title Pt will achieve highest level of functioning in all ADL, IADL, and leisure tasks.   Time 8   Period Weeks   Status New   OT LONG TERM GOAL #2   Title Pt will improve RUE AROM to atleast [facility] for improved ability to get mixing bowls out of cabinets.   Time 8   Period Weeks   Status New   OT LONG TERM GOAL #3   Title pt will improve strength in RUE to atleast 4+/5 for improved ability to mix batters in the kitchen   Time 8   Period Weeks   Status New   OT LONG TERM GOAL #4   Title pt will decrease fascial restrictions to trace, for decreased pain with motion   Time 8   Period Weeks   Status New   OT LONG TERM GOAL #5   Title Pt will have pain in RUE of less than 3/10 when engaged in reaching tasks   Time 8   Period Weeks   Status New               Plan - 10/06/14 1159    Clinical Impression Statement Initiated MFR, PROM, seated AROM stretches this session. Pt tolerated all stretches well, with some increased pain.  Demonstrated good form with thumbtacks and pro/ret/elev/dep.     Plan P: Add AAROM supine.        Problem List Patient Active Problem List   Diagnosis Date Noted  . Iron deficiency 08/25/2014  . Microcytic anemia 06/17/2014  . Insomnia 05/04/2014  . Hypothyroidism 06/05/2013  . Renal insufficiency 06/05/2013  . Essential hypertension, benign 06/05/2013  . Stress fracture of right foot 02/27/2013  . Chronic radicular lumbar pain 02/27/2013  . Other and unspecified hyperlipidemia 01/16/2013  . Type 2 diabetes mellitus with HbA1C goal below 7.5 01/16/2013  . GERD (gastroesophageal reflux disease) 12/27/2011    Bea Graff Arno Cullers, MS, OTR/L [place] [phone] 10/06/2014, 12:00 PM  [facility] [address] [place], [place], [address] Phone: [phone]   Fax:  [phone]

## 2014-10-07 ENCOUNTER — Encounter (HOSPITAL_COMMUNITY): Payer: 59 | Attending: Hematology and Oncology | Admitting: Hematology & Oncology

## 2014-10-07 ENCOUNTER — Encounter (HOSPITAL_COMMUNITY): Payer: Self-pay | Admitting: Hematology & Oncology

## 2014-10-07 VITALS — BP 121/83 | HR 97 | Temp 97.8°F | Resp 18 | Wt 193.8 lb

## 2014-10-07 DIAGNOSIS — D509 Iron deficiency anemia, unspecified: Secondary | ICD-10-CM

## 2014-10-07 DIAGNOSIS — E611 Iron deficiency: Secondary | ICD-10-CM

## 2014-10-07 DIAGNOSIS — R58 Hemorrhage, not elsewhere classified: Secondary | ICD-10-CM | POA: Insufficient documentation

## 2014-10-07 DIAGNOSIS — M109 Gout, unspecified: Secondary | ICD-10-CM | POA: Insufficient documentation

## 2014-10-07 DIAGNOSIS — I10 Essential (primary) hypertension: Secondary | ICD-10-CM | POA: Diagnosis not present

## 2014-10-07 DIAGNOSIS — E039 Hypothyroidism, unspecified: Secondary | ICD-10-CM | POA: Insufficient documentation

## 2014-10-07 DIAGNOSIS — E780 Pure hypercholesterolemia: Secondary | ICD-10-CM | POA: Insufficient documentation

## 2014-10-07 DIAGNOSIS — K219 Gastro-esophageal reflux disease without esophagitis: Secondary | ICD-10-CM | POA: Diagnosis not present

## 2014-10-07 DIAGNOSIS — J309 Allergic rhinitis, unspecified: Secondary | ICD-10-CM | POA: Insufficient documentation

## 2014-10-07 DIAGNOSIS — E119 Type 2 diabetes mellitus without complications: Secondary | ICD-10-CM | POA: Insufficient documentation

## 2014-10-07 LAB — CBC WITH DIFFERENTIAL/PLATELET
Basophils Absolute: 0 10*3/uL (ref 0.0–0.1)
Basophils Relative: 0 % (ref 0–1)
Eosinophils Absolute: 0.1 10*3/uL (ref 0.0–0.7)
Eosinophils Relative: 2 % (ref 0–5)
HCT: 37 % (ref 36.0–46.0)
Hemoglobin: 12 g/dL (ref 12.0–15.0)
Lymphocytes Relative: 33 % (ref 12–46)
Lymphs Abs: 2.3 10*3/uL (ref 0.7–4.0)
MCH: 28.1 pg (ref 26.0–34.0)
MCHC: 32.4 g/dL (ref 30.0–36.0)
MCV: 86.7 fL (ref 78.0–100.0)
Monocytes Absolute: 0.4 10*3/uL (ref 0.1–1.0)
Monocytes Relative: 6 % (ref 3–12)
Neutro Abs: 4.2 10*3/uL (ref 1.7–7.7)
Neutrophils Relative %: 59 % (ref 43–77)
Platelets: 282 10*3/uL (ref 150–400)
RBC: 4.27 MIL/uL (ref 3.87–5.11)
RDW: 16 % — ABNORMAL HIGH (ref 11.5–15.5)
WBC: 7.1 10*3/uL (ref 4.0–10.5)

## 2014-10-07 LAB — BASIC METABOLIC PANEL
Anion gap: 8 (ref 5–15)
BUN: 26 mg/dL — ABNORMAL HIGH (ref 6–23)
CO2: 25 mmol/L (ref 19–32)
Calcium: 9.8 mg/dL (ref 8.4–10.5)
Chloride: 107 mEq/L (ref 96–112)
Creatinine, Ser: 1.39 mg/dL — ABNORMAL HIGH (ref 0.50–1.10)
GFR calc Af Amer: 46 mL/min — ABNORMAL LOW (ref >90)
GFR calc non Af Amer: 40 mL/min — ABNORMAL LOW (ref >90)
Glucose, Bld: 111 mg/dL — ABNORMAL HIGH (ref 70–99)
Potassium: 3.7 mmol/L (ref 3.5–5.1)
Sodium: 140 mmol/L (ref 135–145)

## 2014-10-07 LAB — IRON AND TIBC
Iron: 74 ug/dL (ref 42–135)
Saturation Ratios: 27 % (ref 20–55)
TIBC: 270 ug/dL (ref 250–470)
UIBC: 196 ug/dL (ref 125–400)

## 2014-10-07 NOTE — Patient Instructions (Signed)
[facility] Discharge Instructions  RECOMMENDATIONS MADE BY THE CONSULTANT AND ANY TEST RESULTS WILL BE SENT TO YOUR REFERRING PHYSICIAN. Call Dr. Oneida Alar office to reschedule your pill cam Please call us with problems or concerns We will call you next week with your lab results. If you need more iron we will get that arranged for you.  Thank you for choosing [facility] to provide your oncology and hematology care.  To afford each patient quality time with our providers, please arrive at least 15 minutes before your scheduled appointment time.  With your help, our goal is to use those 15 minutes to complete the necessary work-up to ensure our physicians have the information they need to help with your evaluation and healthcare recommendations.    Effective January 1st, 2014, we ask that you re-schedule your appointment with our physicians should you arrive 10 or more minutes late for your appointment.  We strive to give you quality time with our providers, and arriving late affects you and other patients whose appointments are after yours.    Again, thank you for choosing [facility].  Our hope is that these requests will decrease the amount of time that you wait before being seen by our physicians.       _____________________________________________________________  Should you have questions after your visit to [facility], please contact our office at [phone] between the hours of 8:30 a.m. and 5:00 p.m.  Voicemails left after 4:30 p.m. will not be returned until the following business day.  For prescription refill requests, have your pharmacy contact our office with your prescription refill request.

## 2014-10-07 NOTE — Progress Notes (Signed)
Joanna [name]'s reason for visit today is for labs as scheduled per MD orders.  Venipuncture performed with a 23 gauge butterfly needle to L Antecubital.  Joanna [name] tolerated procedure well and without incident; questions were answered and patient was discharged.

## 2014-10-08 ENCOUNTER — Ambulatory Visit (HOSPITAL_COMMUNITY)
Admission: RE | Admit: 2014-10-08 | Discharge: 2014-10-08 | Disposition: A | Discharge status: Home / Self Care | Payer: 59 | Source: Ambulatory Visit | Attending: Family Medicine | Admitting: Family Medicine

## 2014-10-08 ENCOUNTER — Encounter (HOSPITAL_COMMUNITY): Payer: Self-pay

## 2014-10-08 DIAGNOSIS — R29898 Other symptoms and signs involving the musculoskeletal system: Secondary | ICD-10-CM

## 2014-10-08 DIAGNOSIS — M25611 Stiffness of right shoulder, not elsewhere classified: Secondary | ICD-10-CM

## 2014-10-08 DIAGNOSIS — Z5189 Encounter for other specified aftercare: Secondary | ICD-10-CM | POA: Diagnosis not present

## 2014-10-08 DIAGNOSIS — M25511 Pain in right shoulder: Secondary | ICD-10-CM

## 2014-10-08 LAB — FERRITIN: Ferritin: 688 ng/mL — ABNORMAL HIGH (ref 10–291)

## 2014-10-08 NOTE — Therapy (Signed)
[place], [place], [address] Phone: [phone]   Fax:  [phone]  Occupational Therapy Treatment  Patient Details  Name: Joanna [name] MRN: [id number] Date of Birth: [birth year]  Encounter Date: 10/08/2014      OT End of Session - 10/08/14 0928    Visit Number 3   Number of Visits 24   Date for OT Re-Evaluation 11/02/14   Authorization Type UHC   Authorization Time Period 65 visits   Authorization - Visit Number 3   Authorization - Number of Visits 29   OT Start Time 0849   OT Stop Time 0927   OT Time Calculation (min) 38 min   Activity Tolerance Patient tolerated treatment well   Behavior During Therapy [facility] for tasks assessed/performed      Past Medical History  Diagnosis Date  . GERD (gastroesophageal reflux disease)   . Diabetes mellitus   . Hypertension   . Depression   . Insomnia   . Diverticulosis   . Thyroid disease     hypothyroid  . Sleep apnea   . Dyslipidemia   . Microproteinuria   . Renal insufficiency 2010    Past Surgical History  Procedure Laterality Date  . Total abdominal hysterectomy w/ bilateral salpingoophorectomy    . Knee arthroscopy Right   . Esophagogastroduodenoscopy   01/29/2012    Fields-2-3cm sliding HH, fundic gland polyps, NEGATIVE H pylori  . Colonoscopy  01/29/12    Fields-2-3 hyperplastic polyps, mod internal hemorrhoids, diverticulosis throughout colon  . Back surgery    . Abdominal hysterectomy  1984  . Bilateral oophorectomy    . Colonoscopy N/A 07/03/2014    Procedure: COLONOSCOPY;  Surgeon: [name], MD;  [facility];  Service: Endoscopy;  Laterality: N/A;  230-moved to 8:30 - Ginger notified pt  . Esophagogastroduodenoscopy N/A 07/03/2014    Procedure: ESOPHAGOGASTRODUODENOSCOPY (EGD);  Surgeon: [name], MD;  [facility];  Service: Endoscopy;  Laterality: N/A;    There were no vitals taken for this visit.  Visit  Diagnosis:  Pain in joint, shoulder region, right  Decreased range of motion of shoulder, right  Weakness of shoulder      Subjective Assessment - 10/08/14 0847    Limitations Progress as tolerated   Currently in Pain? Yes   Pain Score 5    Pain Location Arm   Pain Orientation Right  bicep   Pain Descriptors / Indicators Aching   Pain Type Acute pain          [facility] OT Assessment - 10/08/14 0850    Precautions   Precautions None               OT Treatments/Exercises (OP) - 10/08/14 0850    Shoulder Exercises: Supine   Protraction PROM;AAROM;10 reps   Horizontal ABduction PROM;AAROM;10 reps   External Rotation PROM;AAROM;10 reps   Internal Rotation PROM;AAROM;10 reps   Flexion PROM;AAROM;10 reps   ABduction PROM;AAROM;10 reps   Shoulder Exercises: Seated   Elevation AROM;12 reps   Extension AROM;12 reps   Row AROM;12 reps   Shoulder Exercises: ROM/Strengthening   Thumb Tacks 1 min   Prot/Ret//Elev/Dep 1 min   Manual Therapy   Manual Therapy Myofascial release   Myofascial Release MFR and manual stretching to RUE bicep, upper arm, anterior shoulder, and scapular regions.                    OT  Short Term Goals - 10/05/14 1612    OT SHORT TERM GOAL #1   Title Pt will be educated on HEP   Time 4   Period Weeks   Status New   OT SHORT TERM GOAL #2   Title Pt will improve RUE PROM to [facility] for improved ability to reach into kitchen cabinets.   Time 4   Period Weeks   Status New   OT SHORT TERM GOAL #3   Title Pt will imporve fascial restrictions in RUE to minimal, for decreased pain with motion   Time 4   Period Weeks   Status New   OT SHORT TERM GOAL #4   Title Pt will have pain of less than 5/10 when engaging in reaching tasks.   Time 4   Period Weeks   Status New           OT Long Term Goals - 10/05/14 1616    OT LONG TERM GOAL #1   Title Pt will achieve highest level of functioning in all ADL, IADL, and leisure tasks.   Time 8    Period Weeks   Status New   OT LONG TERM GOAL #2   Title Pt will improve RUE AROM to atleast [facility] for improved ability to get mixing bowls out of cabinets.   Time 8   Period Weeks   Status New   OT LONG TERM GOAL #3   Title pt will improve strength in RUE to atleast 4+/5 for improved ability to mix batters in the kitchen   Time 8   Period Weeks   Status New   OT LONG TERM GOAL #4   Title pt will decrease fascial restrictions to trace, for decreased pain with motion   Time 8   Period Weeks   Status New   OT LONG TERM GOAL #5   Title Pt will have pain in RUE of less than 3/10 when engaged in reaching tasks   Time 8   Period Weeks   Status New               Plan - 10/08/14 9574    Clinical Impression Statement pt with increased sorenss in BUe this session.  Continue MFR and PROM, with some increased pain.  Added supine AAROM this session.  Pt had good form and completed all exercises well, but with some discomfort in RUE.  Continued thumbtacks and pro/ret/elev/dep with good form.     Plan Add seated AAROM and red scapular theraband.        Problem List Patient Active Problem List   Diagnosis Date Noted  . Iron deficiency 08/25/2014  . Microcytic anemia 06/17/2014  . Insomnia 05/04/2014  . Hypothyroidism 06/05/2013  . Renal insufficiency 06/05/2013  . Essential hypertension, benign 06/05/2013  . Stress fracture of right foot 02/27/2013  . Chronic radicular lumbar pain 02/27/2013  . Other and unspecified hyperlipidemia 01/16/2013  . Type 2 diabetes mellitus with HbA1C goal below 7.5 01/16/2013  . GERD (gastroesophageal reflux disease) 12/27/2011    Bea Graff , MS, OTR/L [place] [phone] 10/08/2014, 9:30 AM  [place], [place], [address] Phone: [phone]   Fax:  [phone]

## 2014-10-11 NOTE — Assessment & Plan Note (Signed)
63 year old female with iron deficiency anemia. Counts are improved after iron replacement. She notices that her pagophagia is also decreased. She has to reschedule her capsule endoscopy as she had to cancel the procedure but did not reschedule. Her remaining GI evaluation is complete. We addressed causes of iron deficiency including malabsorption and GI related losses. We will call her with the results of her ferritin when available and advise if she needs additional replacement.  I will see her back in 3 months with labs including CBC, ferritin and iron studies. Sooner should she have any problems or concerns prior.

## 2014-10-11 NOTE — Assessment & Plan Note (Signed)
Her counts are markedly improved. Microcytosis has resolved.  She may still need additional iron to completely replete her iron stores. She will be notified of ferritin when available.  If her H/H do not completely normalize prior to f/u labs we will investigate further then. I anticipate however given her current H/H and RDW that she still has ongoing iron requirements.

## 2014-10-12 ENCOUNTER — Encounter (HOSPITAL_COMMUNITY): Payer: Self-pay

## 2014-10-12 ENCOUNTER — Telehealth: Payer: Self-pay

## 2014-10-12 ENCOUNTER — Ambulatory Visit (HOSPITAL_COMMUNITY)
Admission: RE | Admit: 2014-10-12 | Discharge: 2014-10-12 | Disposition: A | Discharge status: Home / Self Care | Payer: 59 | Source: Ambulatory Visit | Attending: Orthopedic Surgery | Admitting: Orthopedic Surgery

## 2014-10-12 DIAGNOSIS — M25611 Stiffness of right shoulder, not elsewhere classified: Secondary | ICD-10-CM

## 2014-10-12 DIAGNOSIS — R29898 Other symptoms and signs involving the musculoskeletal system: Secondary | ICD-10-CM

## 2014-10-12 DIAGNOSIS — Z5189 Encounter for other specified aftercare: Secondary | ICD-10-CM | POA: Diagnosis not present

## 2014-10-12 DIAGNOSIS — M25511 Pain in right shoulder: Secondary | ICD-10-CM

## 2014-10-12 NOTE — Therapy (Signed)
[place], [place], [address] Phone: [phone]   Fax:  [phone]  Occupational Therapy Treatment  Patient Details  Name: Joanna [name] MRN: [id number] Date of Birth: Dec 21, 1950  Encounter Date: 10/12/2014      OT End of Session - 10/12/14 1444    Visit Number 4   Number of Visits 24   Date for OT Re-Evaluation 11/02/14   Authorization Type UHC   Authorization Time Period 59 visits   Authorization - Visit Number 4   Authorization - Number of Visits 29   OT Start Time 1350   OT Stop Time 1429   OT Time Calculation (min) 39 min   Activity Tolerance Patient tolerated treatment well   Behavior During Therapy [facility] for tasks assessed/performed      Past Medical History  Diagnosis Date  . GERD (gastroesophageal reflux disease)   . Diabetes mellitus   . Hypertension   . Depression   . Insomnia   . Diverticulosis   . Thyroid disease     hypothyroid  . Sleep apnea   . Dyslipidemia   . Microproteinuria   . Renal insufficiency 2010    Past Surgical History  Procedure Laterality Date  . Total abdominal hysterectomy w/ bilateral salpingoophorectomy    . Knee arthroscopy Right   . Esophagogastroduodenoscopy   01/29/2012    Fields-2-3cm sliding HH, fundic gland polyps, NEGATIVE H pylori  . Colonoscopy  01/29/12    Fields-2-3 hyperplastic polyps, mod internal hemorrhoids, diverticulosis throughout colon  . Back surgery    . Abdominal hysterectomy  1984  . Bilateral oophorectomy    . Colonoscopy N/A 07/03/2014    Procedure: COLONOSCOPY;  Surgeon: [name], MD;  [facility];  Service: Endoscopy;  Laterality: N/A;  230-moved to 8:30 - Ginger notified pt  . Esophagogastroduodenoscopy N/A 07/03/2014    Procedure: ESOPHAGOGASTRODUODENOSCOPY (EGD);  Surgeon: [name], MD;  [facility];  Service: Endoscopy;  Laterality: N/A;    There were no vitals taken for this visit.  Visit  Diagnosis:  Pain in joint, shoulder region, right  Decreased range of motion of shoulder, right  Weakness of shoulder      Subjective Assessment - 10/12/14 1349    Symptoms "Its hurting today. And it was hurting Christmas day, too."   Limitations Progress as tolerated   Currently in Pain? Yes   Pain Score 5    Pain Location Shoulder   Pain Orientation Right   Pain Descriptors / Indicators Aching   Pain Type Acute pain          [facility] OT Assessment - 10/12/14 1352    Precautions   Precautions None               OT Treatments/Exercises (OP) - 10/12/14 0001    Shoulder Exercises: Supine   Protraction PROM;5 reps;AAROM;12 reps   Horizontal ABduction PROM;5 reps;AAROM;12 reps   External Rotation PROM;5 reps;AAROM;12 reps   Internal Rotation PROM;5 reps;AAROM;12 reps   Flexion PROM;5 reps;AAROM;12 reps   ABduction PROM;5 reps;AAROM;12 reps   Shoulder Exercises: Seated   Elevation AROM;12 reps   Extension AROM;12 reps   Row AROM;12 reps   Protraction AAROM;10 reps   Horizontal ABduction AAROM;10 reps   External Rotation AAROM;10 reps   Internal Rotation AAROM;10 reps   Flexion AAROM;10 reps   Shoulder Exercises: ROM/Strengthening   Wall Wash 1 min   Manual Therapy   Manual Therapy  Myofascial release   Myofascial Release MFR and manual stretching to RUE bicep, upper arm, anterior shoulder, and scapular regions.                    OT Short Term Goals - 10/05/14 1612    OT SHORT TERM GOAL #1   Title Pt will be educated on HEP   Time 4   Period Weeks   Status New   OT SHORT TERM GOAL #2   Title Pt will improve RUE PROM to [facility] for improved ability to reach into kitchen cabinets.   Time 4   Period Weeks   Status New   OT SHORT TERM GOAL #3   Title Pt will imporve fascial restrictions in RUE to minimal, for decreased pain with motion   Time 4   Period Weeks   Status New   OT SHORT TERM GOAL #4   Title Pt will have pain of less than 5/10 when  engaging in reaching tasks.   Time 4   Period Weeks   Status New           OT Long Term Goals - 10/05/14 1616    OT LONG TERM GOAL #1   Title Pt will achieve highest level of functioning in all ADL, IADL, and leisure tasks.   Time 8   Period Weeks   Status New   OT LONG TERM GOAL #2   Title Pt will improve RUE AROM to atleast [facility] for improved ability to get mixing bowls out of cabinets.   Time 8   Period Weeks   Status New   OT LONG TERM GOAL #3   Title pt will improve strength in RUE to atleast 4+/5 for improved ability to mix batters in the kitchen   Time 8   Period Weeks   Status New   OT LONG TERM GOAL #4   Title pt will decrease fascial restrictions to trace, for decreased pain with motion   Time 8   Period Weeks   Status New   OT LONG TERM GOAL #5   Title Pt will have pain in RUE of less than 3/10 when engaged in reaching tasks   Time 8   Period Weeks   Status New               Plan - 10/12/14 1445    Clinical Impression Statement Continued MFR, PROM, and supine AAROM this session, with good tolerance.  Added seated AAROM, with some pain and discomfort.  Educated pt on importance of pushing to point of stretch, rather than point of pain.  Added wall wash, and pt completed with good tolerance.     Plan P: Add scapular theraband.         Problem List Patient Active Problem List   Diagnosis Date Noted  . Iron deficiency 08/25/2014  . Microcytic anemia 06/17/2014  . Insomnia 05/04/2014  . Hypothyroidism 06/05/2013  . Renal insufficiency 06/05/2013  . Essential hypertension, benign 06/05/2013  . Stress fracture of right foot 02/27/2013  . Chronic radicular lumbar pain 02/27/2013  . Other and unspecified hyperlipidemia 01/16/2013  . Type 2 diabetes mellitus with HbA1C goal below 7.5 01/16/2013  . GERD (gastroesophageal reflux disease) 12/27/2011    Bea Graff Brennin Durfee, MS, OTR/L [place] [phone] 10/12/2014, 2:46  PM  [place] [address] [place], [place], [address] Phone: [phone]   Fax:  [phone]

## 2014-10-12 NOTE — Telephone Encounter (Signed)
Pt is calling to set up her [employer]ivens Capsule. She never had it done in Oct. Please advise if it is ok to go ahead with setting it up.

## 2014-10-13 ENCOUNTER — Other Ambulatory Visit: Payer: Self-pay

## 2014-10-13 ENCOUNTER — Encounter (HOSPITAL_COMMUNITY): Payer: 59 | Admitting: Specialist

## 2014-10-13 DIAGNOSIS — K922 Gastrointestinal hemorrhage, unspecified: Secondary | ICD-10-CM

## 2014-10-13 DIAGNOSIS — D509 Iron deficiency anemia, unspecified: Secondary | ICD-10-CM

## 2014-10-13 NOTE — Telephone Encounter (Signed)
Called [facility] and spoke with Loralee Pacas. And no pre-cert is required

## 2014-10-13 NOTE — Telephone Encounter (Signed)
OK TO SCHEDULE.  FOR DX: FEDA, OBSCURE GI BLEED

## 2014-10-13 NOTE — Telephone Encounter (Signed)
Pt is scheduled for Givens on 10/26/2014. Pt aware

## 2014-10-14 ENCOUNTER — Ambulatory Visit (HOSPITAL_COMMUNITY)
Admission: RE | Admit: 2014-10-14 | Discharge: 2014-10-14 | Disposition: A | Discharge status: Home / Self Care | Payer: 59 | Source: Ambulatory Visit | Attending: Family Medicine | Admitting: Family Medicine

## 2014-10-14 ENCOUNTER — Encounter (HOSPITAL_COMMUNITY): Payer: Self-pay

## 2014-10-14 DIAGNOSIS — M25511 Pain in right shoulder: Secondary | ICD-10-CM

## 2014-10-14 DIAGNOSIS — R29898 Other symptoms and signs involving the musculoskeletal system: Secondary | ICD-10-CM

## 2014-10-14 DIAGNOSIS — M25611 Stiffness of right shoulder, not elsewhere classified: Secondary | ICD-10-CM

## 2014-10-14 DIAGNOSIS — Z5189 Encounter for other specified aftercare: Secondary | ICD-10-CM | POA: Diagnosis not present

## 2014-10-14 NOTE — Therapy (Signed)
[place], [place], [address] Phone: [phone]   Fax:  [phone]  Occupational Therapy Treatment  Patient Details  Name: Joanna [name] MRN: [id number] Date of Birth: 01-Apr-1951  Encounter Date: 10/14/2014      OT End of Session - 10/14/14 1105    Visit Number 5   Number of Visits 24   Date for OT Re-Evaluation 11/02/14   Authorization Type UHC   Authorization Time Period 18 visits   Authorization - Visit Number 5   Authorization - Number of Visits 29   OT Start Time 1020   OT Stop Time 1105   OT Time Calculation (min) 45 min   Activity Tolerance Patient tolerated treatment well   Behavior During Therapy [facility] for tasks assessed/performed      Past Medical History  Diagnosis Date  . GERD (gastroesophageal reflux disease)   . Diabetes mellitus   . Hypertension   . Depression   . Insomnia   . Diverticulosis   . Thyroid disease     hypothyroid  . Sleep apnea   . Dyslipidemia   . Microproteinuria   . Renal insufficiency 2010    Past Surgical History  Procedure Laterality Date  . Total abdominal hysterectomy w/ bilateral salpingoophorectomy    . Knee arthroscopy Right   . Esophagogastroduodenoscopy   01/29/2012    Fields-2-3cm sliding HH, fundic gland polyps, NEGATIVE H pylori  . Colonoscopy  01/29/12    Fields-2-3 hyperplastic polyps, mod internal hemorrhoids, diverticulosis throughout colon  . Back surgery    . Abdominal hysterectomy  1984  . Bilateral oophorectomy    . Colonoscopy N/A 07/03/2014    Procedure: COLONOSCOPY;  Surgeon: [name], MD;  [facility];  Service: Endoscopy;  Laterality: N/A;  230-moved to 8:30 - Ginger notified pt  . Esophagogastroduodenoscopy N/A 07/03/2014    Procedure: ESOPHAGOGASTRODUODENOSCOPY (EGD);  Surgeon: [name], MD;  [facility];  Service: Endoscopy;  Laterality: N/A;    There were no vitals taken for this visit.  Visit  Diagnosis:  Pain in joint, shoulder region, right  Decreased range of motion of shoulder, right  Weakness of shoulder      Subjective Assessment - 10/14/14 1034    Symptoms S: It hurts a little today.    Currently in Pain? Yes   Pain Score 4    Pain Location Shoulder   Pain Orientation Right   Pain Descriptors / Indicators Aching   Pain Type Acute pain          [facility] OT Assessment - 10/14/14 1035    Precautions   Precautions None               OT Treatments/Exercises (OP) - 10/14/14 1035    Shoulder Exercises: Supine   Protraction PROM;5 reps;AAROM;12 reps   Horizontal ABduction PROM;5 reps;AAROM;12 reps   External Rotation PROM;5 reps;AAROM;12 reps   Internal Rotation PROM;5 reps;AAROM;12 reps   Flexion PROM;5 reps;AAROM;12 reps   ABduction PROM;5 reps;AAROM;12 reps   Shoulder Exercises: Seated   Elevation AROM;12 reps   Extension AROM;12 reps   Row AROM;12 reps   Protraction AAROM;12 reps   Horizontal ABduction AAROM;12 reps   External Rotation AAROM;12 reps   Internal Rotation AAROM;12 reps   Flexion AAROM;12 reps   Modalities   Modalities Electrical Stimulation;Moist Heat   Moist Heat Therapy   Number Minutes Moist Heat 15 Minutes   Moist Heat Location Shoulder  right   Acupuncturist Location right shoulder   Electrical Stimulation Action interferential   Electrical Stimulation Parameters 5.3 CV; 15'   Electrical Stimulation Goals Pain   Manual Therapy   Manual Therapy Myofascial release   Myofascial Release MFyofascial release and manual stretching to RUE bicep, upper arm, anterior shoulder, and scapular regions                   OT Short Term Goals - 10/05/14 1612    OT SHORT TERM GOAL #1   Title Pt will be educated on HEP   Time 4   Period Weeks   Status New   OT SHORT TERM GOAL #2   Title Pt will improve RUE PROM to [facility] for improved ability to reach into kitchen cabinets.   Time 4   Period  Weeks   Status New   OT SHORT TERM GOAL #3   Title Pt will imporve fascial restrictions in RUE to minimal, for decreased pain with motion   Time 4   Period Weeks   Status New   OT SHORT TERM GOAL #4   Title Pt will have pain of less than 5/10 when engaging in reaching tasks.   Time 4   Period Weeks   Status New           OT Long Term Goals - 10/05/14 1616    OT LONG TERM GOAL #1   Title Pt will achieve highest level of functioning in all ADL, IADL, and leisure tasks.   Time 8   Period Weeks   Status New   OT LONG TERM GOAL #2   Title Pt will improve RUE AROM to atleast [facility] for improved ability to get mixing bowls out of cabinets.   Time 8   Period Weeks   Status New   OT LONG TERM GOAL #3   Title pt will improve strength in RUE to atleast 4+/5 for improved ability to mix batters in the kitchen   Time 8   Period Weeks   Status New   OT LONG TERM GOAL #4   Title pt will decrease fascial restrictions to trace, for decreased pain with motion   Time 8   Period Weeks   Status New   OT LONG TERM GOAL #5   Title Pt will have pain in RUE of less than 3/10 when engaged in reaching tasks   Time 8   Period Weeks   Status New               Plan - 10/14/14 1105    Clinical Impression Statement A: Pt presented with increased tender points throughout upper right arm. Ended session with ES and moist heat for pain management. DId not complete scapular theraband due to pain level.    Plan P: Add scapular theraband.         Problem List Patient Active Problem List   Diagnosis Date Noted  . Iron deficiency 08/25/2014  . Microcytic anemia 06/17/2014  . Insomnia 05/04/2014  . Hypothyroidism 06/05/2013  . Renal insufficiency 06/05/2013  . Essential hypertension, benign 06/05/2013  . Stress fracture of right foot 02/27/2013  . Chronic radicular lumbar pain 02/27/2013  . Other and unspecified hyperlipidemia 01/16/2013  . Type 2 diabetes mellitus with HbA1C goal below  7.5 01/16/2013  . GERD (gastroesophageal reflux disease) 12/27/2011    Ailene Ravel, OTR/L,CBIS  [phone]  10/14/2014, 11:08 AM  [place]  [place], [place], [address] Phone: [phone]   Fax:  [phone]

## 2014-10-19 ENCOUNTER — Ambulatory Visit (HOSPITAL_COMMUNITY): Payer: 59 | Admitting: Specialist

## 2014-10-19 DIAGNOSIS — Z5189 Encounter for other specified aftercare: Secondary | ICD-10-CM | POA: Insufficient documentation

## 2014-10-19 DIAGNOSIS — M25511 Pain in right shoulder: Secondary | ICD-10-CM | POA: Insufficient documentation

## 2014-10-19 DIAGNOSIS — M25611 Stiffness of right shoulder, not elsewhere classified: Secondary | ICD-10-CM | POA: Insufficient documentation

## 2014-10-21 ENCOUNTER — Ambulatory Visit (HOSPITAL_COMMUNITY)
Admission: RE | Admit: 2014-10-21 | Discharge: 2014-10-21 | Disposition: A | Discharge status: Home / Self Care | Payer: 59 | Source: Ambulatory Visit | Attending: Family Medicine | Admitting: Family Medicine

## 2014-10-21 ENCOUNTER — Encounter (HOSPITAL_COMMUNITY): Payer: Self-pay

## 2014-10-21 DIAGNOSIS — R29898 Other symptoms and signs involving the musculoskeletal system: Secondary | ICD-10-CM

## 2014-10-21 DIAGNOSIS — M25511 Pain in right shoulder: Secondary | ICD-10-CM

## 2014-10-21 DIAGNOSIS — M25611 Stiffness of right shoulder, not elsewhere classified: Secondary | ICD-10-CM | POA: Diagnosis not present

## 2014-10-21 DIAGNOSIS — Z5189 Encounter for other specified aftercare: Secondary | ICD-10-CM | POA: Diagnosis not present

## 2014-10-21 NOTE — Therapy (Signed)
[place], [place], [address] Phone: [phone]   Fax:  [phone]  Occupational Therapy Treatment  Patient Details  Name: Joanna [name] MRN: [id number] Date of Birth: [birth year] Referring Provider:  [name], MD  Encounter Date: 10/21/2014      OT End of Session - 10/21/14 1620    Visit Number 6   Number of Visits 24   Date for OT Re-Evaluation 11/02/14   Authorization Type UHC   Authorization Time Period 65 visits   Authorization - Visit Number 6   Authorization - Number of Visits 29   OT Start Time 1351   OT Stop Time 1429   OT Time Calculation (min) 38 min   Activity Tolerance Patient tolerated treatment well   Behavior During Therapy [facility] for tasks assessed/performed      Past Medical History  Diagnosis Date  . GERD (gastroesophageal reflux disease)   . Diabetes mellitus   . Hypertension   . Depression   . Insomnia   . Diverticulosis   . Thyroid disease     hypothyroid  . Sleep apnea   . Dyslipidemia   . Microproteinuria   . Renal insufficiency 2010    Past Surgical History  Procedure Laterality Date  . Total abdominal hysterectomy w/ bilateral salpingoophorectomy    . Knee arthroscopy Right   . Esophagogastroduodenoscopy   01/29/2012    Fields-2-3cm sliding HH, fundic gland polyps, NEGATIVE H pylori  . Colonoscopy  01/29/12    Fields-2-3 hyperplastic polyps, mod internal hemorrhoids, diverticulosis throughout colon  . Back surgery    . Abdominal hysterectomy  1984  . Bilateral oophorectomy    . Colonoscopy N/A 07/03/2014    Procedure: COLONOSCOPY;  Surgeon: [name], MD;  [facility];  Service: Endoscopy;  Laterality: N/A;  230-moved to 8:30 - Ginger notified pt  . Esophagogastroduodenoscopy N/A 07/03/2014    Procedure: ESOPHAGOGASTRODUODENOSCOPY (EGD);  Surgeon: [name], MD;  [facility];  Service: Endoscopy;  Laterality: N/A;    There were  no vitals taken for this visit.  Visit Diagnosis:  Pain in joint, shoulder region, right  Decreased range of motion of shoulder, right  Weakness of shoulder      Subjective Assessment - 10/21/14 1354    Symptoms "My shoulder's not bothering me today... but my back's been real bad over the past 4 days or so."   Limitations Progress as tolerated   Currently in Pain? Yes   Pain Score 8    Pain Location Back   Pain Descriptors / Indicators Aching;Sharp   Pain Type Acute pain          [facility] OT Assessment - 10/21/14 0001    Precautions   Precautions None               OT Treatments/Exercises (OP) - 10/21/14 1355    Shoulder Exercises: Supine   Protraction PROM;5 reps;AAROM;15 reps   Horizontal ABduction PROM;5 reps;AAROM;15 reps   External Rotation PROM;5 reps;AAROM;15 reps   Internal Rotation PROM;5 reps;AAROM;15 reps   Flexion PROM;5 reps;AAROM;15 reps   ABduction PROM;5 reps;AAROM;15 reps   Shoulder Exercises: Standing   Extension Theraband;12 reps   Theraband Level (Shoulder Extension) Level 2 (Red)   Row Theraband;12 reps   Theraband Level (Shoulder Row) Level 2 (Red)   Retraction Theraband;12 reps   Theraband Level (Shoulder Retraction) Level 2 (Red)   Manual Therapy   Manual Therapy Myofascial  release   Myofascial Release MFR and manual stretching to RUE bicep, upper arm, anterior shoulder, and scapular regions.                    OT Short Term Goals - 10/05/14 1612    OT SHORT TERM GOAL #1   Title Pt will be educated on HEP   Time 4   Period Weeks   Status New   OT SHORT TERM GOAL #2   Title Pt will improve RUE PROM to [facility] for improved ability to reach into kitchen cabinets.   Time 4   Period Weeks   Status New   OT SHORT TERM GOAL #3   Title Pt will imporve fascial restrictions in RUE to minimal, for decreased pain with motion   Time 4   Period Weeks   Status New   OT SHORT TERM GOAL #4   Title Pt will have pain of less than 5/10  when engaging in reaching tasks.   Time 4   Period Weeks   Status New           OT Long Term Goals - 10/05/14 1616    OT LONG TERM GOAL #1   Title Pt will achieve highest level of functioning in all ADL, IADL, and leisure tasks.   Time 8   Period Weeks   Status New   OT LONG TERM GOAL #2   Title Pt will improve RUE AROM to [facility] for improved ability to get mixing bowls out of cabinets.   Time 8   Period Weeks   Status New   OT LONG TERM GOAL #3   Title pt will improve strength in RUE to atleast 4+/5 for improved ability to mix batters in the kitchen   Time 8   Period Weeks   Status New   OT LONG TERM GOAL #4   Title pt will decrease fascial restrictions to trace, for decreased pain with motion   Time 8   Period Weeks   Status New   OT LONG TERM GOAL #5   Title Pt will have pain in RUE of less than 3/10 when engaged in reaching tasks   Time 8   Period Weeks   Status New               Plan - 10/21/14 1621    Clinical Impression Statement Pt with increased tenderness in right upper arm this session - some releif with MFR.  Continued PROM with good tolerance and increased supine AAROM to 15 reps. Added red scapular theraband this session. pt verbalized feeling good stretch and no increased pain in shoulder at end of session.     Plan P: continued seated AAROM.  Attempt few reps supine AROM.        Problem List Patient Active Problem List   Diagnosis Date Noted  . Iron deficiency 08/25/2014  . Microcytic anemia 06/17/2014  . Insomnia 05/04/2014  . Hypothyroidism 06/05/2013  . Renal insufficiency 06/05/2013  . Essential hypertension, benign 06/05/2013  . Stress fracture of right foot 02/27/2013  . Chronic radicular lumbar pain 02/27/2013  . Other and unspecified hyperlipidemia 01/16/2013  . Type 2 diabetes mellitus with HbA1C goal below 7.5 01/16/2013  . GERD (gastroesophageal reflux disease) 12/27/2011    Bea Graff Frida Wahlstrom, MS, OTR/L [place] 10/21/2014, 4:23 PM  [place], [place], [address] Phone: [phone]  Fax:  [phone]

## 2014-10-21 NOTE — Patient Instructions (Signed)

## 2014-10-23 ENCOUNTER — Encounter (HOSPITAL_COMMUNITY): Payer: Self-pay

## 2014-10-23 ENCOUNTER — Ambulatory Visit (HOSPITAL_COMMUNITY)
Admission: RE | Admit: 2014-10-23 | Discharge: 2014-10-23 | Disposition: A | Discharge status: Home / Self Care | Payer: 59 | Source: Ambulatory Visit | Attending: Family Medicine | Admitting: Family Medicine

## 2014-10-23 DIAGNOSIS — M25511 Pain in right shoulder: Secondary | ICD-10-CM

## 2014-10-23 DIAGNOSIS — M25611 Stiffness of right shoulder, not elsewhere classified: Secondary | ICD-10-CM

## 2014-10-23 DIAGNOSIS — Z5189 Encounter for other specified aftercare: Secondary | ICD-10-CM | POA: Diagnosis not present

## 2014-10-23 DIAGNOSIS — R29898 Other symptoms and signs involving the musculoskeletal system: Secondary | ICD-10-CM

## 2014-10-23 NOTE — Therapy (Signed)
[place], [place], [address] Phone: [phone]   Fax:  [phone]  Occupational Therapy Treatment  Patient Details  Name: Joanna [name] MRN: [id number] Date of Birth: [birth year] Referring Provider:  [name], MD  Encounter Date: 10/23/2014      OT End of Session - 10/23/14 1610    Visit Number 7   Number of Visits 24   Date for OT Re-Evaluation 11/02/14   Authorization Type UHC   Authorization Time Period 59 visits   Authorization - Visit Number 7   Authorization - Number of Visits 29   OT Start Time 1435   OT Stop Time 1516   OT Time Calculation (min) 41 min   Activity Tolerance Patient tolerated treatment well   Behavior During Therapy [facility] for tasks assessed/performed      Past Medical History  Diagnosis Date  . GERD (gastroesophageal reflux disease)   . Diabetes mellitus   . Hypertension   . Depression   . Insomnia   . Diverticulosis   . Thyroid disease     hypothyroid  . Sleep apnea   . Dyslipidemia   . Microproteinuria   . Renal insufficiency 2010    Past Surgical History  Procedure Laterality Date  . Total abdominal hysterectomy w/ bilateral salpingoophorectomy    . Knee arthroscopy Right   . Esophagogastroduodenoscopy   01/29/2012    Fields-2-3cm sliding HH, fundic gland polyps, NEGATIVE H pylori  . Colonoscopy  01/29/12    Fields-2-3 hyperplastic polyps, mod internal hemorrhoids, diverticulosis throughout colon  . Back surgery    . Abdominal hysterectomy  1984  . Bilateral oophorectomy    . Colonoscopy N/A 07/03/2014    Procedure: COLONOSCOPY;  Surgeon: [name], MD;  [facility];  Service: Endoscopy;  Laterality: N/A;  230-moved to 8:30 - Ginger notified pt  . Esophagogastroduodenoscopy N/A 07/03/2014    Procedure: ESOPHAGOGASTRODUODENOSCOPY (EGD);  Surgeon: [name], MD;  [facility];  Service: Endoscopy;  Laterality: N/A;    There were  no vitals taken for this visit.  Visit Diagnosis:  Pain in joint, shoulder region, right  Decreased range of motion of shoulder, right  Weakness of shoulder      Subjective Assessment - 10/23/14 1437    Symptoms "it feels ok today."   Limitations Progress as tolerated   Currently in Pain? Yes   Pain Score 4    Pain Location Shoulder   Pain Orientation Right   Pain Descriptors / Indicators Aching   Pain Type Acute pain          [facility] OT Assessment - 10/23/14 0001    Precautions   Precautions None               OT Treatments/Exercises (OP) - 10/23/14 1437    Shoulder Exercises: Supine   Protraction PROM;5 reps;AROM;10 reps   Horizontal ABduction PROM;5 reps;AROM;10 reps   External Rotation PROM;5 reps;AROM;10 reps   Internal Rotation PROM;5 reps;AROM;10 reps   Flexion PROM;5 reps;AROM;10 reps   ABduction PROM;5 reps;AROM;10 reps   Shoulder Exercises: Standing   Extension Theraband;12 reps   Theraband Level (Shoulder Extension) Level 2 (Red)   Row Theraband;12 reps   Theraband Level (Shoulder Row) Level 2 (Red)   Retraction Theraband;12 reps   Theraband Level (Shoulder Retraction) Level 2 (Red)   Shoulder Exercises: Stretch   Other Shoulder Stretches Wall stretch in flexion and abduction.  3 reps  wtih 10 " holds.    Manual Therapy   Manual Therapy Myofascial release   Myofascial Release MFR and manual stretching to RUE bicep, upper arm, anterior shoulder, and scapular regions                  OT Education - 10/23/14 1610    Education provided Yes   Education Details Flexion and abduction wall stretches   Person(s) Educated Patient   Methods Explanation;Demonstration;Handout   Comprehension Verbalized understanding;Returned demonstration          OT Short Term Goals - 10/05/14 1612    OT SHORT TERM GOAL #1   Title Pt will be educated on HEP   Time 4   Period Weeks   Status New   OT SHORT TERM GOAL #2   Title Pt will improve RUE PROM to  [facility] for improved ability to reach into kitchen cabinets.   Time 4   Period Weeks   Status New   OT SHORT TERM GOAL #3   Title Pt will imporve fascial restrictions in RUE to minimal, for decreased pain with motion   Time 4   Period Weeks   Status New   OT SHORT TERM GOAL #4   Title Pt will have pain of less than 5/10 when engaging in reaching tasks.   Time 4   Period Weeks   Status New           OT Long Term Goals - 10/05/14 1616    OT LONG TERM GOAL #1   Title Pt will achieve highest level of functioning in all ADL, IADL, and leisure tasks.   Time 8   Period Weeks   Status New   OT LONG TERM GOAL #2   Title Pt will improve RUE AROM to atleast [facility] for improved ability to get mixing bowls out of cabinets.   Time 8   Period Weeks   Status New   OT LONG TERM GOAL #3   Title pt will improve strength in RUE to atleast 4+/5 for improved ability to mix batters in the kitchen   Time 8   Period Weeks   Status New   OT LONG TERM GOAL #4   Title pt will decrease fascial restrictions to trace, for decreased pain with motion   Time 8   Period Weeks   Status New   OT LONG TERM GOAL #5   Title Pt will have pain in RUE of less than 3/10 when engaged in reaching tasks   Time 8   Period Weeks   Status New               Plan - 10/23/14 1610    Clinical Impression Statement Pt remains with max fascial restrictison and tenderness in R upper arm - decrased tightness with MFR.  Progressed to supine AROM this session - pt tolerated well.  Pt only has increased pain during movement when her shoulder pops.  Educated pt on flexion and abduction wall slides for HEP - pt verbalized and demonstrated good understanding and good stretch.  continued red theraband.     Plan P: Add proximal shoulder strengthening supine.  follow up on wall slides for HEP.          Problem List Patient Active Problem List   Diagnosis Date Noted  . Iron deficiency 08/25/2014  . Microcytic anemia  06/17/2014  . Insomnia 05/04/2014  . Hypothyroidism 06/05/2013  . Renal insufficiency 06/05/2013  . Essential hypertension,  benign 06/05/2013  . Stress fracture of right foot 02/27/2013  . Chronic radicular lumbar pain 02/27/2013  . Other and unspecified hyperlipidemia 01/16/2013  . Type 2 diabetes mellitus with HbA1C goal below 7.5 01/16/2013  . GERD (gastroesophageal reflux disease) 12/27/2011     Bea Graff Savier Trickett, MS, OTR/L [place] 10/23/2014, [place], [place], [address] Phone: [phone]   Fax:  [phone]

## 2014-10-23 NOTE — Patient Instructions (Signed)
Closed Chain: Shoulder Abduction / Adduction - on Wall   One hand on wall to the side, slide hand up the wall and lean towards the wall for a good stretch. Hold 10 seconds, repeat 3-5 times. 2-3 times per day.   http://ss.exer.us/267   Copyright  VHI. All rights reserved.  SHOULDER: Flexion At Wall   Slide both arms up wall (or just right arm) while leaning gently into wall. Maintain upright posture and tuck in stomach. Hold 10 seconds. Repeat 3-5 times, 2-3 times per day.  Copyright  VHI. All rights reserved.    Bea Graff Annaly Skop, MS, OTR/L [place]

## 2014-10-26 ENCOUNTER — Ambulatory Visit (HOSPITAL_COMMUNITY)
Admission: RE | Admit: 2014-10-26 | Discharge: 2014-10-26 | Disposition: A | Discharge status: Home / Self Care | Payer: 59 | Source: Ambulatory Visit | Attending: Gastroenterology | Admitting: Gastroenterology

## 2014-10-26 ENCOUNTER — Encounter (HOSPITAL_COMMUNITY)
Admission: RE | Disposition: A | Discharge status: Home / Self Care | Payer: Self-pay | Source: Ambulatory Visit | Attending: Gastroenterology

## 2014-10-26 ENCOUNTER — Other Ambulatory Visit: Payer: Self-pay | Admitting: Family Medicine

## 2014-10-26 DIAGNOSIS — D509 Iron deficiency anemia, unspecified: Secondary | ICD-10-CM | POA: Diagnosis present

## 2014-10-26 DIAGNOSIS — Q2733 Arteriovenous malformation of digestive system vessel: Secondary | ICD-10-CM

## 2014-10-26 DIAGNOSIS — K922 Gastrointestinal hemorrhage, unspecified: Secondary | ICD-10-CM | POA: Insufficient documentation

## 2014-10-26 DIAGNOSIS — K921 Melena: Secondary | ICD-10-CM

## 2014-10-26 HISTORY — PX: GIVENS CAPSULE STUDY: SHX5432

## 2014-10-26 SURGERY — IMAGING PROCEDURE, GI TRACT, INTRALUMINAL, VIA CAPSULE

## 2014-10-27 ENCOUNTER — Encounter (HOSPITAL_COMMUNITY): Payer: Self-pay | Admitting: Gastroenterology

## 2014-10-28 ENCOUNTER — Ambulatory Visit (HOSPITAL_COMMUNITY)
Admission: RE | Admit: 2014-10-28 | Discharge: 2014-10-28 | Disposition: A | Discharge status: Home / Self Care | Payer: 59 | Source: Ambulatory Visit | Attending: Family Medicine | Admitting: Family Medicine

## 2014-10-28 ENCOUNTER — Encounter (HOSPITAL_COMMUNITY): Payer: Self-pay

## 2014-10-28 DIAGNOSIS — M25611 Stiffness of right shoulder, not elsewhere classified: Secondary | ICD-10-CM

## 2014-10-28 DIAGNOSIS — R29898 Other symptoms and signs involving the musculoskeletal system: Secondary | ICD-10-CM

## 2014-10-28 DIAGNOSIS — M25511 Pain in right shoulder: Secondary | ICD-10-CM

## 2014-10-28 DIAGNOSIS — Z5189 Encounter for other specified aftercare: Secondary | ICD-10-CM | POA: Diagnosis not present

## 2014-10-28 NOTE — Therapy (Signed)
[place], [place], [address] Phone: [phone]   Fax:  [phone]  Occupational Therapy Treatment  Patient Details  Name: Joanna [name] MRN: [id number] Date of Birth: [birth year] Referring Provider:  [name], MD  Encounter Date: 10/28/2014      OT End of Session - 10/28/14 1508    Visit Number 8   Number of Visits 24   Date for OT Re-Evaluation 11/02/14   Authorization Type UHC   Authorization Time Period 41 visits   Authorization - Visit Number 8   Authorization - Number of Visits 29   OT Start Time 1430   OT Stop Time 1518   OT Time Calculation (min) 48 min      Past Medical History  Diagnosis Date  . GERD (gastroesophageal reflux disease)   . Diabetes mellitus   . Hypertension   . Depression   . Insomnia   . Diverticulosis   . Thyroid disease     hypothyroid  . Sleep apnea   . Dyslipidemia   . Microproteinuria   . Renal insufficiency 2010    Past Surgical History  Procedure Laterality Date  . Total abdominal hysterectomy w/ bilateral salpingoophorectomy    . Knee arthroscopy Right   . Esophagogastroduodenoscopy   01/29/2012    Fields-2-3cm sliding HH, fundic gland polyps, NEGATIVE H pylori  . Colonoscopy  01/29/12    Fields-2-3 hyperplastic polyps, mod internal hemorrhoids, diverticulosis throughout colon  . Back surgery    . Abdominal hysterectomy  1984  . Bilateral oophorectomy    . Colonoscopy N/A 07/03/2014    Procedure: COLONOSCOPY;  Surgeon: [name], MD;  [facility];  Service: Endoscopy;  Laterality: N/A;  230-moved to 8:30 - Ginger notified pt  . Esophagogastroduodenoscopy N/A 07/03/2014    Procedure: ESOPHAGOGASTRODUODENOSCOPY (EGD);  Surgeon: [name], MD;  [facility];  Service: Endoscopy;  Laterality: N/A;  . Givens capsule study N/A 10/26/2014    Procedure: GIVENS CAPSULE STUDY;  Surgeon: [name], MD;  [facility];   Service: Endoscopy;  Laterality: N/A;    There were no vitals taken for this visit.  Visit Diagnosis:  Pain in joint, shoulder region, right  Decreased range of motion of shoulder, right  Weakness of shoulder      Subjective Assessment - 10/28/14 1428    Symptoms "its ok today."   Limitations Progress as tolerated   Currently in Pain? Yes   Pain Score 3    Pain Location Shoulder   Pain Orientation Right   Pain Descriptors / Indicators Aching   Pain Type Acute pain          [facility] OT Assessment - 10/28/14 0001    Precautions   Precautions None               OT Treatments/Exercises (OP) - 10/28/14 1430    Shoulder Exercises: Supine   Protraction PROM;5 reps;AROM;12 reps   Horizontal ABduction PROM;5 reps;AROM;12 reps   External Rotation PROM;5 reps;AROM;12 reps   Internal Rotation PROM;5 reps;AROM;12 reps   Flexion PROM;5 reps;AROM;12 reps   ABduction PROM;5 reps;AROM;12 reps   Shoulder Exercises: Seated   Protraction AAROM;12 reps   Horizontal ABduction AAROM;12 reps   External Rotation AAROM;12 reps   Internal Rotation AAROM;12 reps   Flexion AAROM;12 reps   Shoulder Exercises: ROM/Strengthening   Proximal Shoulder Strengthening, Supine 10x AROM    Shoulder Exercises: Stretch  Other Shoulder Stretches Posterior capsule stretch - 3 reps with 10" holds   Manual Therapy   Manual Therapy Myofascial release   Myofascial Release MFR and manual stretching to RUE bicep, upper arm, anterior shoulder, and scapular regions                    OT Short Term Goals - 10/05/14 1612    OT SHORT TERM GOAL #1   Title Pt will be educated on HEP   Time 4   Period Weeks   Status New   OT SHORT TERM GOAL #2   Title Pt will improve RUE PROM to [facility] for improved ability to reach into kitchen cabinets.   Time 4   Period Weeks   Status New   OT SHORT TERM GOAL #3   Title Pt will imporve fascial restrictions in RUE to minimal, for decreased pain with motion    Time 4   Period Weeks   Status New   OT SHORT TERM GOAL #4   Title Pt will have pain of less than 5/10 when engaging in reaching tasks.   Time 4   Period Weeks   Status New           OT Long Term Goals - 10/05/14 1616    OT LONG TERM GOAL #1   Title Pt will achieve highest level of functioning in all ADL, IADL, and leisure tasks.   Time 8   Period Weeks   Status New   OT LONG TERM GOAL #2   Title Pt will improve RUE AROM to atleast [facility] for improved ability to get mixing bowls out of cabinets.   Time 8   Period Weeks   Status New   OT LONG TERM GOAL #3   Title pt will improve strength in RUE to atleast 4+/5 for improved ability to mix batters in the kitchen   Time 8   Period Weeks   Status New   OT LONG TERM GOAL #4   Title pt will decrease fascial restrictions to trace, for decreased pain with motion   Time 8   Period Weeks   Status New   OT LONG TERM GOAL #5   Title Pt will have pain in RUE of less than 3/10 when engaged in reaching tasks   Time 8   Period Weeks   Status New               Plan - 10/28/14 1511    Clinical Impression Statement Pt with increased tenderness in right upper arm this session - minimal releif with MFR this date, but was able to tolerate exercises. Increased supine AROM reps to 12. Added proxiimal shoudler strenghting, with increased pain during circles. Pt indicates she is completing new flexion and abduction wall stretches at home and has no questions. Continued current AAROM reps, due to increased pain with AROM this session. Provided heatpack at end of session to decrease pain and tightness.     Plan P: Begin session with heatpack to right upper arm.  Continue posterior capsule stretch. Re-Evaluation.        Problem List Patient Active Problem List   Diagnosis Date Noted  . Iron deficiency 08/25/2014  . Microcytic anemia 06/17/2014  . Insomnia 05/04/2014  . Hypothyroidism 06/05/2013  . Renal insufficiency 06/05/2013  .  Essential hypertension, benign 06/05/2013  . Stress fracture of right foot 02/27/2013  . Chronic radicular lumbar pain 02/27/2013  . Other and unspecified hyperlipidemia  01/16/2013  . Type 2 diabetes mellitus with HbA1C goal below 7.5 01/16/2013  . GERD (gastroesophageal reflux disease) 12/27/2011     Bea Graff Tracey Stewart, MS, OTR/L [place] [phone] 10/28/2014, 3:14 PM   [place], [place], [address] Phone: [phone]   Fax:  [phone]

## 2014-10-30 ENCOUNTER — Ambulatory Visit (HOSPITAL_COMMUNITY)
Admission: RE | Admit: 2014-10-30 | Discharge: 2014-10-30 | Disposition: A | Discharge status: Home / Self Care | Payer: 59 | Source: Ambulatory Visit | Attending: Orthopedic Surgery | Admitting: Orthopedic Surgery

## 2014-10-30 ENCOUNTER — Encounter (HOSPITAL_COMMUNITY): Payer: Self-pay

## 2014-10-30 DIAGNOSIS — R29898 Other symptoms and signs involving the musculoskeletal system: Secondary | ICD-10-CM

## 2014-10-30 DIAGNOSIS — M25611 Stiffness of right shoulder, not elsewhere classified: Secondary | ICD-10-CM

## 2014-10-30 DIAGNOSIS — M25511 Pain in right shoulder: Secondary | ICD-10-CM

## 2014-10-30 DIAGNOSIS — Z5189 Encounter for other specified aftercare: Secondary | ICD-10-CM | POA: Diagnosis not present

## 2014-10-30 NOTE — Patient Instructions (Signed)
Complete each with canned soup/vegetables, water bottles. Start with 10 reps - increase to 15 as you're able.  Not pictured: -Punches forward -Arms out in front of you, opening to each side and closing back in front of you.   ROM: Abduction (Standing)   Bring arms straight out from sides and raise as high as possible without pain.  http://orth.exer.us/910   Copyright  VHI. All rights reserved.   Extension (Active) ROM: Extension (Standing)   Bring arms straight back as far as possible without pain.  http://orth.exer.us/916   Copyright  VHI. All rights reserved.   ROM: External / Internal Rotation - in Abduction (Standing)   With upper arms parallel to floor and elbows bent at right angles, gently rotate arms up then down as far as possible without pain.  **alternative completed in thrapy- elbow bent and at your sides, rotating in across your stomach and out to the side.**  http://orth.exer.us/912   Copyright  VHI. All rights reserved.    Flexors Stretch (Active)   Stand, arms straight at sides. Bring arms straight forward and upward as high as possible without pain. Copyright  VHI. All rights reserved.    Posterior Capsule Stretch   Stand or sit, one arm across body so hand rests over opposite shoulder. Gently push on crossed elbow with other hand until stretch is felt in shoulder of crossed arm. Hold 10-15 seconds.  Repeat 5 times per session.   Copyright  VHI. All rights reserved.    Bea Graff Deaisha Welborn, MS, OTR/L [place]

## 2014-10-30 NOTE — Therapy (Signed)
[place], [place], [address] Phone: [phone]   Fax:  [phone]  Occupational Therapy Treatment  Patient Details  Name: Joanna [name] MRN: [id number] Date of Birth: 1951-01-22 Referring Provider:  Carole Civil, MD  Encounter Date: 10/30/2014      OT End of Session - 10/30/14 1155    Visit Number 9   Number of Visits 24   Authorization Type UHC   Authorization Time Period 29 visits   Authorization - Visit Number 9   Authorization - Number of Visits 29   OT Start Time 1104   OT Stop Time 1151   OT Time Calculation (min) 47 min   Activity Tolerance Patient tolerated treatment well   Behavior During Therapy [facility] for tasks assessed/performed      Past Medical History  Diagnosis Date  . GERD (gastroesophageal reflux disease)   . Diabetes mellitus   . Hypertension   . Depression   . Insomnia   . Diverticulosis   . Thyroid disease     hypothyroid  . Sleep apnea   . Dyslipidemia   . Microproteinuria   . Renal insufficiency 2010    Past Surgical History  Procedure Laterality Date  . Total abdominal hysterectomy w/ bilateral salpingoophorectomy    . Knee arthroscopy Right   . Esophagogastroduodenoscopy   01/29/2012    Fields-2-3cm sliding HH, fundic gland polyps, NEGATIVE H pylori  . Colonoscopy  01/29/12    Fields-2-3 hyperplastic polyps, mod internal hemorrhoids, diverticulosis throughout colon  . Back surgery    . Abdominal hysterectomy  1984  . Bilateral oophorectomy    . Colonoscopy N/A 07/03/2014    Procedure: COLONOSCOPY;  Surgeon: [name], MD;  [facility];  Service: Endoscopy;  Laterality: N/A;  230-moved to 8:30 - Ginger notified pt  . Esophagogastroduodenoscopy N/A 07/03/2014    Procedure: ESOPHAGOGASTRODUODENOSCOPY (EGD);  Surgeon: [name], MD;  [facility];  Service: Endoscopy;  Laterality: N/A;  . Givens capsule study N/A 10/26/2014   Procedure: GIVENS CAPSULE STUDY;  Surgeon: [name], MD;  [facility];  Service: Endoscopy;  Laterality: N/A;    There were no vitals taken for this visit.  Visit Diagnosis:  Pain in joint, shoulder region, right  Decreased range of motion of shoulder, right  Weakness of shoulder      Subjective Assessment - 10/30/14 1108    Symptoms "I can move my arm now!"   Limitations Progress as tolerated   Currently in Pain? Yes   Pain Score 5    Pain Location Shoulder   Pain Orientation Right   Pain Descriptors / Indicators Aching;Sore   Pain Type Acute pain          [facility] OT Assessment - 10/30/14 0001    Precautions   Precautions None   ADL   ADL comments Pt reports improvement sin ADL and IADL activites.  She can now better toelrate mixing items in the kitchen, but still has difficulty reaching into kitchen cabinets.   Observation/Other Assessments   Focus on Therapeutic Outcomes (FOTO)  FOTO 68.7/100   Sensation   Additional Comments Pt reports less tingling in R fingertips.   AROM   Right Shoulder Flexion 170 Degrees  88 (no pain this date, previous 8/10)   Right Shoulder ABduction 170 Degrees  123   Right Shoulder Internal Rotation 100 Degrees  94   Right Shoulder External Rotation 71 Degrees  40   PROM   Right Shoulder Flexion 177 Degrees  112   Right Shoulder ABduction 180 Degrees  164   Right Shoulder Internal Rotation 90 Degrees  98   Right Shoulder External Rotation 90 Degrees  38   Strength   Right Shoulder Flexion 4+/5   Right Shoulder ABduction 4+/5   Right Shoulder Internal Rotation 4+/5   Right Shoulder External Rotation 4/5               OT Treatments/Exercises (OP) - 10/30/14 1154    Shoulder Exercises: Supine   Protraction PROM;5 reps   Horizontal ABduction PROM;5 reps   External Rotation PROM;5 reps   Internal Rotation PROM;5 reps   Flexion PROM;5 reps   ABduction PROM;5 reps   Moist Heat Therapy   Number Minutes  Moist Heat 10 Minutes   Moist Heat Location Shoulder  right   Manual Therapy   Manual Therapy Myofascial release   Myofascial Release MFR and manual stretching to RUE bicep, upper arm, anterior shoulder, and scapular regions                  OT Education - 10/30/14 1154    Education provided Yes   Education Details standing AROm and posterior capsule stretch   Person(s) Educated Patient   Methods Explanation;Demonstration;Handout   Comprehension Verbalized understanding;Returned demonstration          OT Short Term Goals - 10/30/14 1138    OT SHORT TERM GOAL #1   Title Pt will be educated on HEP   Status Achieved   OT SHORT TERM GOAL #2   Title Pt will improve RUE PROM to [facility] for improved ability to reach into kitchen cabinets.   Status Achieved   OT SHORT TERM GOAL #3   Title Pt will imporve fascial restrictions in RUE to minimal, for decreased pain with motion   Status Achieved   OT SHORT TERM GOAL #4   Title Pt will have pain of less than 5/10 when engaging in reaching tasks.   Status Achieved           OT Long Term Goals - 10/30/14 1139    OT LONG TERM GOAL #1   Title Pt will achieve highest level of functioning in all ADL, IADL, and leisure tasks.   Status Achieved   OT LONG TERM GOAL #2   Title Pt will improve RUE AROM to atleast [facility] for improved ability to get mixing bowls out of cabinets.   Status Achieved   OT LONG TERM GOAL #3   Title pt will improve strength in RUE to atleast 4+/5 for improved ability to mix batters in the kitchen   Status Partially Met   OT LONG TERM GOAL #4   Title pt will decrease fascial restrictions to trace, for decreased pain with motion   Status On-going   OT LONG TERM GOAL #5   Title Pt will have pain in RUE of less than 3/10 when engaged in reaching tasks   Status Achieved               Plan - 10/30/14 1155    Clinical Impression Statement Re-evaluation completed this session.  Pt has made great progress  during OT sessions, meeting all STG and all but one LTG.  Pt continues to have minimal tightness in R upper arm.  Educated pt on use of tennis ball for self massage and sue of heatpack.  Also provided pt with additional stretches and  updated AROM HEP for home sue.  Pt needs no further OT services at this time.     Plan Pt is discharged from OT services.        Problem List Patient Active Problem List   Diagnosis Date Noted  . Iron deficiency 08/25/2014  . Microcytic anemia 06/17/2014  . Insomnia 05/04/2014  . Hypothyroidism 06/05/2013  . Renal insufficiency 06/05/2013  . Essential hypertension, benign 06/05/2013  . Stress fracture of right foot 02/27/2013  . Chronic radicular lumbar pain 02/27/2013  . Other and unspecified hyperlipidemia 01/16/2013  . Type 2 diabetes mellitus with HbA1C goal below 7.5 01/16/2013  . GERD (gastroesophageal reflux disease) 12/27/2011     OCCUPATIONAL THERAPY DISCHARGE SUMMARY  Visits from Start of Care: 9  Current functional level related to goals / functional outcomes: Pt is indepdnent in all ADL/IADLs and has returned to PLOF.   Remaining deficits: Min fascial restrictions   Education / Equipment: Updated HEP Plan: Patient agrees to discharge.  Patient goals were met. Patient is being discharged due to meeting the stated rehab goals.  ?????         Bea Graff Rebekkah Powless, MS, OTR/L [place] [phone] 10/30/2014, 12:01 PM  [place], [place], [address] Phone: [phone]   Fax:  [phone]

## 2014-11-03 ENCOUNTER — Encounter (HOSPITAL_COMMUNITY): Payer: 59 | Admitting: Specialist

## 2014-11-04 ENCOUNTER — Telehealth: Payer: Self-pay | Admitting: Gastroenterology

## 2014-11-04 ENCOUNTER — Encounter: Payer: Self-pay | Admitting: Gastroenterology

## 2014-11-04 NOTE — Telephone Encounter (Signed)
PLEASE CALL PT. Joanna [name] GIVENS CAPSULE SHOW LOW BLOOD COUNT DUE TO SMALL BOWEL AVMs. THESE ARE RED SPOTS ON THE SURFACE OF TH SMALL BOWEL THAT HAVE A TENDENCY TO BLEED AND CAUSE Joanna [name] IRON STORES TO BE LOW.  Plan: 1. SHE SHOULD DISCUSS BENEFITS V. RISKS OF ASA USE WITH DR. Whitney Muse. DR. Oneida Alar WOULD FAVOR HOLDING ASA UNLESS MEDICALLY NECESSARY FOR MI OR CVA. 2. DISCUSS NEED FOR ORAL IRON  V. IV IRON WITH DR. Whitney Muse. 3. OPV JUL 2016 E30 ANEMIA/SMALL BOWEL AVMs.

## 2014-11-04 NOTE — Procedures (Signed)
Indication: 64 yo female with microcytic anemia( JUL 2015 HB 8.8 MCV 71.4.)and low normal FERRITIN(10) IN AUG 2015. RARE BRBPR. DEC 201 HB 12.0 MCV 86.7 AFTER IVFE.   PATIENT DATA: WEIGHT: 190 LBS     WAIST:  40 IN    HEIGHT:  62 IN   GASTRIC PASSAGE TIME: 63 m, SB PASSAGE TIME: 4H 67m  RESULTS: LIMITED views of gastric mucosa due to retained contents.  FREQUENT AVMS SEEN STARTING IN DUODENUM(36:34, 11 % SB TIME) AND EXTENDING TO THE JEJUNUM(3:00:22, 56% SB TIME)LIMITED VIEWS OF THE COLON DUE TO RETAINED CONTENTS. No old blood or fresh blood in the stomach. MELENA SEEN IN small bowel, AND colon.  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 IN THE DUODENUM  Plan: 1. PT SHOULD DISCUSS BENEFITS V. RISKS OF ASA USE WITH DR. Whitney Muse. I WOULD FAVOR HOLDING ASA UNLESS MEDICALLY NECESSARY 2. DISCUSS NEED FOR PO V. IVFE WITH DR. Whitney Muse. 3. OPV JUL 2016

## 2014-11-04 NOTE — Telephone Encounter (Signed)
Pt is aware.  

## 2014-11-04 NOTE — Telephone Encounter (Signed)
ON RECALL LIST  °

## 2014-11-05 ENCOUNTER — Encounter (HOSPITAL_COMMUNITY): Payer: 59

## 2014-11-10 ENCOUNTER — Encounter (HOSPITAL_COMMUNITY): Payer: 59 | Admitting: Specialist

## 2014-11-12 ENCOUNTER — Encounter (HOSPITAL_COMMUNITY): Payer: 59

## 2014-11-13 ENCOUNTER — Encounter: Payer: Self-pay | Admitting: Gastroenterology

## 2014-11-22 ENCOUNTER — Other Ambulatory Visit: Payer: Self-pay | Admitting: Family Medicine

## 2014-11-25 ENCOUNTER — Encounter: Payer: Self-pay | Admitting: Family Medicine

## 2014-11-25 ENCOUNTER — Ambulatory Visit (INDEPENDENT_AMBULATORY_CARE_PROVIDER_SITE_OTHER): Payer: 59 | Admitting: Family Medicine

## 2014-11-25 VITALS — BP 124/80 | Ht 62.0 in | Wt 192.0 lb

## 2014-11-25 DIAGNOSIS — I10 Essential (primary) hypertension: Secondary | ICD-10-CM

## 2014-11-25 DIAGNOSIS — E038 Other specified hypothyroidism: Secondary | ICD-10-CM

## 2014-11-25 DIAGNOSIS — E785 Hyperlipidemia, unspecified: Secondary | ICD-10-CM

## 2014-11-25 DIAGNOSIS — E119 Type 2 diabetes mellitus without complications: Secondary | ICD-10-CM

## 2014-11-25 DIAGNOSIS — G4700 Insomnia, unspecified: Secondary | ICD-10-CM

## 2014-11-25 DIAGNOSIS — Z79899 Other long term (current) drug therapy: Secondary | ICD-10-CM

## 2014-11-25 DIAGNOSIS — N289 Disorder of kidney and ureter, unspecified: Secondary | ICD-10-CM

## 2014-11-25 DIAGNOSIS — Z1231 Encounter for screening mammogram for malignant neoplasm of breast: Secondary | ICD-10-CM

## 2014-11-25 LAB — BASIC METABOLIC PANEL
BUN: 26 mg/dL — ABNORMAL HIGH (ref 6–23)
CO2: 27 mEq/L (ref 19–32)
Calcium: 9.4 mg/dL (ref 8.4–10.5)
Chloride: 106 mEq/L (ref 96–112)
Creat: 1.68 mg/dL — ABNORMAL HIGH (ref 0.50–1.10)
Glucose, Bld: 140 mg/dL — ABNORMAL HIGH (ref 70–99)
Potassium: 3.8 mEq/L (ref 3.5–5.3)
Sodium: 144 mEq/L (ref 135–145)

## 2014-11-25 LAB — HEPATIC FUNCTION PANEL
ALT: 19 U/L (ref 0–35)
AST: 18 U/L (ref 0–37)
Albumin: 4.1 g/dL (ref 3.5–5.2)
Alkaline Phosphatase: 94 U/L (ref 39–117)
Bilirubin, Direct: 0.1 mg/dL (ref 0.0–0.3)
Indirect Bilirubin: 0.2 mg/dL (ref 0.2–1.2)
Total Bilirubin: 0.3 mg/dL (ref 0.2–1.2)
Total Protein: 6.4 g/dL (ref 6.0–8.3)

## 2014-11-25 LAB — POCT GLYCOSYLATED HEMOGLOBIN (HGB A1C): Hemoglobin A1C: 7.1

## 2014-11-25 MED ORDER — OMEPRAZOLE 40 MG PO CPDR: 40.0000 mg | DELAYED_RELEASE_CAPSULE | Freq: Every day | ORAL | Status: DC

## 2014-11-25 MED ORDER — TRAZODONE HCL 100 MG PO TABS: ORAL_TABLET | ORAL | Status: DC

## 2014-11-25 MED ORDER — AMOXICILLIN-POT CLAVULANATE 875-125 MG PO TABS: 1.0000 | ORAL_TABLET | Freq: Two times a day (BID) | ORAL | Status: DC

## 2014-11-25 MED ORDER — LOSARTAN POTASSIUM 100 MG PO TABS: 100.0000 mg | ORAL_TABLET | Freq: Every day | ORAL | Status: DC

## 2014-11-25 MED ORDER — HYDROCODONE-ACETAMINOPHEN 10-325 MG PO TABS: 1.0000 | ORAL_TABLET | ORAL | Status: DC | PRN

## 2014-11-25 MED ORDER — LEVOTHYROXINE SODIUM 88 MCG PO TABS: ORAL_TABLET | ORAL | Status: DC

## 2014-11-25 MED ORDER — GLIPIZIDE 5 MG PO TABS: ORAL_TABLET | ORAL | Status: DC

## 2014-11-25 MED ORDER — PRAVASTATIN SODIUM 20 MG PO TABS: 20.0000 mg | ORAL_TABLET | Freq: Every day | ORAL | Status: DC

## 2014-11-25 NOTE — Progress Notes (Signed)
   Subjective:    Patient ID: Joanna [name], female    DOB: [birth year], 64 y.o.   MRN: [id number]  Diabetes She presents for her follow-up diabetic visit. She has type 2 diabetes mellitus. Pertinent negatives for diabetes include no chest pain. Current diabetic treatment includes oral agent (monotherapy). There is no compliance with monitoring of blood glucose. She does not see a podiatrist.Eye exam is current.   The patient was seen today as part of a comprehensive diabetic check up. The patient had the following elements completed: -Review of medication compliance -Review of glucose monitoring results -Review of any complications do to high or low sugars -Diabetic foot exam was completed as part of today's visit. The following was also discussed: -Importance of yearly eye exams -Importance of following diabetic/low sugar-starch diet -Importance of exercise and regular activity -Importance of regular followup visits. -Most recent hemoglobin A1c were reviewed with the patient along with goals regarding diabetes.  Also c/o of cough that started last Friday.   Review of Systems  Constitutional: Negative for fever and activity change.  HENT: Positive for congestion and rhinorrhea. Negative for ear pain.   Eyes: Negative for discharge.  Respiratory: Positive for cough. Negative for shortness of breath and wheezing.   Cardiovascular: Negative for chest pain.       Objective:   Physical Exam  Constitutional: She appears well-developed and well-nourished. No distress.  HENT:  Head: Normocephalic.  Nose: Nose normal.  Mouth/Throat: Oropharynx is clear and moist. No oropharyngeal exudate.  Neck: Neck supple.  Cardiovascular: Normal rate, regular rhythm and normal heart sounds.   No murmur heard. Pulmonary/Chest: Effort normal and breath sounds normal. No respiratory distress. She has no wheezes.  Musculoskeletal: She exhibits no edema.  Lymphadenopathy:    She has no cervical  adenopathy.  Neurological: She is alert. She exhibits normal muscle tone.  Skin: Skin is warm and dry.  Psychiatric: Her behavior is normal.  Nursing note and vitals reviewed.    The importance of watching diet staying physically active stressed. A1c does look improved     Assessment & Plan:  1. Type 2 diabetes mellitus without complication 123456 does look improved - POCT glycosylated hemoglobin (Hb A1C) - Microalbumin, urine  2. High risk medication use Patient has history of renal insufficiency also cholesterol medicine check lab work - Hepatic function panel - Basic metabolic panel  3. Visit for screening mammogram Mammogram recommended scheduled - MM DIGITAL SCREENING BILATERAL  4. Essential hypertension, benign Blood pressure under good control continue current measures  5. Other specified hypothyroidism Thyroid under good control continue current measures  6. Renal insufficiency Renal insufficiency check metabolic 7  7. Insomnia Insomnia trazodone  8. Hyperlipidemia Continue pravastatin watch diet  Sinusitis antibiotics prescribed

## 2014-11-26 LAB — MICROALBUMIN, URINE: Microalb, Ur: 12.1 mg/dL — ABNORMAL HIGH (ref <2.0)

## 2014-12-01 ENCOUNTER — Other Ambulatory Visit: Payer: Self-pay | Admitting: *Deleted

## 2014-12-01 DIAGNOSIS — Z79899 Other long term (current) drug therapy: Secondary | ICD-10-CM

## 2014-12-01 DIAGNOSIS — R7989 Other specified abnormal findings of blood chemistry: Secondary | ICD-10-CM

## 2014-12-02 ENCOUNTER — Encounter: Payer: Self-pay | Admitting: Gastroenterology

## 2014-12-03 ENCOUNTER — Ambulatory Visit (HOSPITAL_COMMUNITY): Payer: 59

## 2014-12-08 LAB — BASIC METABOLIC PANEL
BUN: 15 mg/dL (ref 6–23)
CO2: 28 mEq/L (ref 19–32)
Calcium: 9.7 mg/dL (ref 8.4–10.5)
Chloride: 106 mEq/L (ref 96–112)
Creat: 1.4 mg/dL — ABNORMAL HIGH (ref 0.50–1.10)
Glucose, Bld: 180 mg/dL — ABNORMAL HIGH (ref 70–99)
Potassium: 3.5 mEq/L (ref 3.5–5.3)
Sodium: 143 mEq/L (ref 135–145)

## 2014-12-09 ENCOUNTER — Ambulatory Visit: Payer: 59 | Admitting: Family Medicine

## 2014-12-15 ENCOUNTER — Ambulatory Visit (HOSPITAL_COMMUNITY)
Admission: RE | Admit: 2014-12-15 | Discharge: 2014-12-15 | Disposition: A | Discharge status: Home / Self Care | Payer: 59 | Source: Ambulatory Visit | Attending: Family Medicine | Admitting: Family Medicine

## 2014-12-15 DIAGNOSIS — Z1231 Encounter for screening mammogram for malignant neoplasm of breast: Secondary | ICD-10-CM | POA: Insufficient documentation

## 2014-12-22 ENCOUNTER — Encounter: Payer: Self-pay | Admitting: Family Medicine

## 2014-12-22 ENCOUNTER — Ambulatory Visit (INDEPENDENT_AMBULATORY_CARE_PROVIDER_SITE_OTHER): Payer: 59 | Admitting: Family Medicine

## 2014-12-22 VITALS — BP 136/90 | Ht 62.0 in | Wt 192.0 lb

## 2014-12-22 DIAGNOSIS — N289 Disorder of kidney and ureter, unspecified: Secondary | ICD-10-CM | POA: Diagnosis not present

## 2014-12-22 DIAGNOSIS — D509 Iron deficiency anemia, unspecified: Secondary | ICD-10-CM | POA: Diagnosis not present

## 2014-12-22 LAB — POCT HEMOGLOBIN: Hemoglobin: 12.1 g/dL — AB (ref 12.2–16.2)

## 2014-12-22 MED ORDER — DOXAZOSIN MESYLATE 4 MG PO TABS: 4.0000 mg | ORAL_TABLET | Freq: Every day | ORAL | Status: DC

## 2014-12-22 NOTE — Progress Notes (Signed)
   Subjective:    Patient ID: Joanna [name], female    DOB: Dec 05, 1950, 64 y.o.   MRN: [id number]  HPI Patient is here today for a follow up.  She got BW done on 2/10 and her creatinine level was elevated. Pt was advised to stop taking Lozol and to repeat Met7 in 7-10 days. We reviewed over her blood work. Her creatinine is improved. Pt had repeat BW and is here today to discuss results.  No questions/concerns.     Review of Systems  Constitutional: Negative for activity change, appetite change and fatigue.  HENT: Negative for congestion.   Respiratory: Negative for cough.   Cardiovascular: Negative for chest pain.  Gastrointestinal: Negative for abdominal pain.  Endocrine: Negative for polydipsia and polyphagia.  Neurological: Negative for weakness.  Psychiatric/Behavioral: Negative for confusion.       Objective:   Physical Exam  Constitutional: She appears well-nourished. No distress.  Cardiovascular: Normal rate, regular rhythm and normal heart sounds.   No murmur heard. Pulmonary/Chest: Effort normal and breath sounds normal. No respiratory distress.  Musculoskeletal: She exhibits no edema.  Lymphadenopathy:    She has no cervical adenopathy.  Neurological: She is alert. She exhibits normal muscle tone.  Psychiatric: Her behavior is normal.  Vitals reviewed.         Assessment & Plan:  HTN-increase Cardura, 4 mg each evening, watch diet closely, try to lose weight although it has been difficult for this patient  Chronic low back pain uses hydrocodone sparingly tries to do some exercise  Renal insufficiency-patient was told that the best way to keep this under control his avoid excessive protein in the diet. Also keep weight down. Keep blood pressure under good control. Keep glucose under good control. Avoid diuretics currently.  Anemia hemoglobin (or following up with hematology later this month. Patient denies any rectal bleeding recently.  Patient is to  follow-up for a nurse visit for blood pressure in 2 weeks. This blood pressure should be seen by myself as well.

## 2015-01-07 ENCOUNTER — Encounter (HOSPITAL_COMMUNITY): Payer: Self-pay | Admitting: Hematology & Oncology

## 2015-01-07 ENCOUNTER — Encounter (HOSPITAL_COMMUNITY): Payer: 59 | Attending: Hematology & Oncology | Admitting: Hematology & Oncology

## 2015-01-07 ENCOUNTER — Encounter (HOSPITAL_COMMUNITY): Payer: 59

## 2015-01-07 VITALS — BP 157/114 | HR 98 | Temp 98.0°F | Resp 20 | Wt 194.8 lb

## 2015-01-07 DIAGNOSIS — D500 Iron deficiency anemia secondary to blood loss (chronic): Secondary | ICD-10-CM

## 2015-01-07 DIAGNOSIS — K5520 Angiodysplasia of colon without hemorrhage: Secondary | ICD-10-CM | POA: Diagnosis not present

## 2015-01-07 DIAGNOSIS — K922 Gastrointestinal hemorrhage, unspecified: Secondary | ICD-10-CM | POA: Diagnosis not present

## 2015-01-07 DIAGNOSIS — D509 Iron deficiency anemia, unspecified: Secondary | ICD-10-CM | POA: Diagnosis present

## 2015-01-07 DIAGNOSIS — E611 Iron deficiency: Secondary | ICD-10-CM

## 2015-01-07 LAB — CBC WITH DIFFERENTIAL/PLATELET
Basophils Absolute: 0 10*3/uL (ref 0.0–0.1)
Basophils Relative: 1 % (ref 0–1)
Eosinophils Absolute: 0.1 10*3/uL (ref 0.0–0.7)
Eosinophils Relative: 2 % (ref 0–5)
HCT: 36.1 % (ref 36.0–46.0)
Hemoglobin: 11.9 g/dL — ABNORMAL LOW (ref 12.0–15.0)
Lymphocytes Relative: 40 % (ref 12–46)
Lymphs Abs: 2 10*3/uL (ref 0.7–4.0)
MCH: 28.9 pg (ref 26.0–34.0)
MCHC: 33 g/dL (ref 30.0–36.0)
MCV: 87.6 fL (ref 78.0–100.0)
Monocytes Absolute: 0.3 10*3/uL (ref 0.1–1.0)
Monocytes Relative: 6 % (ref 3–12)
Neutro Abs: 2.5 10*3/uL (ref 1.7–7.7)
Neutrophils Relative %: 51 % (ref 43–77)
Platelets: 290 10*3/uL (ref 150–400)
RBC: 4.12 MIL/uL (ref 3.87–5.11)
RDW: 13.9 % (ref 11.5–15.5)
WBC: 5 10*3/uL (ref 4.0–10.5)

## 2015-01-07 NOTE — Progress Notes (Signed)
Joanna [name] presented for labwork. Labs per MD order drawn via Peripheral Line 23 gauge needle inserted in left antecubital.  Good blood return present. Procedure without incident.  Needle removed intact. Patient tolerated procedure well.

## 2015-01-07 NOTE — Patient Instructions (Signed)
[place] at [facility] Discharge Instructions  RECOMMENDATIONS MADE BY THE CONSULTANT AND ANY TEST RESULTS WILL BE SENT TO YOUR REFERRING PHYSICIAN.  Lab work every 3 months.  MD appointment again in 6 months. Return as scheduled.  Thank you for choosing [place] at [facility] to provide your oncology and hematology care.  To afford each patient quality time with our provider, please arrive at least 15 minutes before your scheduled appointment time.    You need to re-schedule your appointment should you arrive 10 or more minutes late.  We strive to give you quality time with our providers, and arriving late affects you and other patients whose appointments are after yours.  Also, if you no show three or more times for appointments you may be dismissed from the clinic at the providers discretion.     Again, thank you for choosing [facility].  Our hope is that these requests will decrease the amount of time that you wait before being seen by our physicians.       _____________________________________________________________  Should you have questions after your visit to [facility], please contact our office at [phone] between the hours of 8:30 a.m. and 4:30 p.m.  Voicemails left after 4:30 p.m. will not be returned until the following business day.  For prescription refill requests, have your pharmacy contact our office.

## 2015-01-07 NOTE — Progress Notes (Signed)
[name], MD [place] [address]  Iron deficiency anemia Pagophagia Colonoscopy 07/03/2014 with severe diverticulosis/polyps EGD 07/03/2014 with moderate non-erosive gastritis Capsule Endoscopy scheduled 08/04/2014 (patient cancelled) Capsule endoscopy 10/26/2014 with small bowel AVM's in the duodenum  CURRENT THERAPY: Skipper Cliche 08/2014  INTERVAL HISTORY: Orion R Pattillo 64 y.o. female returns for iron deficiency anemia. She had an episode of gout over the weekend. She is currently better. She has colchicine at home. She sees Dr. Wolfgang Phoenix for her primary care needs. She states she follows up with him tomorrow as he has recently discontinued one of her blood pressure medications because she states "it was interfering with her kidney function." Her blood pressure today is high and we will is down for her to take to his office tomorrow. She currently denies any headaches, blurry vision, or chest pain. She denies any dark or tarry stool. She denies any pagophagia.  MEDICAL HISTORY: Past Medical History  Diagnosis Date  . GERD (gastroesophageal reflux disease)   . Diabetes mellitus   . Hypertension   . Depression   . Insomnia   . Diverticulosis   . Thyroid disease     hypothyroid  . Sleep apnea   . Dyslipidemia   . Microproteinuria   . Renal insufficiency 2010  . AVM (arteriovenous malformation) of small bowel, acquired JAN 2016 [facility]    has GERD (gastroesophageal reflux disease); Hyperlipidemia; Type 2 diabetes mellitus with HbA1C goal below 7.5; Stress fracture of right foot; Chronic radicular lumbar pain; Hypothyroidism; Renal insufficiency; Essential hypertension, benign; Insomnia; Microcytic anemia; and Iron deficiency on her problem list.     No history exists.     is allergic to ivp dye.  Ms. Lasyone does not currently have medications on file.  SURGICAL HISTORY: Past Surgical History  Procedure Laterality Date  . Total abdominal  hysterectomy w/ bilateral salpingoophorectomy    . Knee arthroscopy Right   . Esophagogastroduodenoscopy   01/29/2012    Fields-2-3cm sliding HH, fundic gland polyps, NEGATIVE H pylori  . Colonoscopy  01/29/12    Fields-2-3 hyperplastic polyps, mod internal hemorrhoids, diverticulosis throughout colon  . Back surgery    . Abdominal hysterectomy  1984  . Bilateral oophorectomy    . Colonoscopy N/A 07/03/2014    Procedure: COLONOSCOPY;  Surgeon: [name], MD;  Location: [facility];  Service: Endoscopy;  Laterality: N/A;  230-moved to 8:30 - Ginger notified pt  . Esophagogastroduodenoscopy N/A 07/03/2014    Procedure: ESOPHAGOGASTRODUODENOSCOPY (EGD);  Surgeon: [name], MD;  Location: [facility];  Service: Endoscopy;  Laterality: N/A;  . Givens capsule study N/A 10/26/2014    SMALL BOWEL AVMs    SOCIAL HISTORY: History   Social History  . Marital Status: Divorced    Spouse Name: N/A  . Number of Children: 2  . 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 Smoking status: Former Smoker -- 0.50 packs/day for 30 years    Types: Cigarettes    Quit date: 10/16/2010  . Smokeless tobacco: Former Systems developer    Quit date: 10/28/2010  . Alcohol Use: Yes     Comment: once per mo, socially  . Drug Use: No  . Sexual Activity: Not on file   Other Topics Concern  . Not on file   Social History Narrative    FAMILY HISTORY: Family History  Problem Relation Age of Onset  .  Cardiomyopathy Father   . Heart failure Mother   . Hyperlipidemia Mother   . Stroke Brother   . Cancer Brother     renal  . Kidney cancer Brother   . Diabetes Paternal Uncle   . Heart disease Paternal Grandmother     Review of Systems  Constitutional: Negative for fever, chills, weight loss and malaise/fatigue.  HENT: Negative for congestion, hearing loss, nosebleeds, sore throat and tinnitus.   Eyes: Negative for blurred vision, double vision, pain  and discharge.  Respiratory: Negative for cough, hemoptysis, sputum production, shortness of breath and wheezing.   Cardiovascular: Negative for chest pain, palpitations, claudication, leg swelling and PND.  Gastrointestinal: Negative for heartburn, nausea, vomiting, abdominal pain, diarrhea, constipation, blood in stool and melena.  Genitourinary: Negative for dysuria, urgency, frequency and hematuria.  Musculoskeletal: Negative for myalgias, joint pain and falls.  Skin: Negative for itching and rash.  Neurological: Negative for dizziness, tingling, tremors, sensory change, speech change, focal weakness, seizures, loss of consciousness, weakness and headaches.  Endo/Heme/Allergies: Does not bruise/bleed easily.  Psychiatric/Behavioral: Negative for depression, suicidal ideas, memory loss and substance abuse. The patient is not nervous/anxious and does not have insomnia.     PHYSICAL EXAMINATION  ECOG PERFORMANCE STATUS: 0 - Asymptomatic  There were no vitals filed for this visit.  Physical Exam  Constitutional: She is oriented to person, place, and time and well-developed, well-nourished, and in no distress.  HENT:  Head: Normocephalic and atraumatic.  Nose: Nose normal.  Mouth/Throat: Oropharynx is clear and moist. No oropharyngeal exudate.  Eyes: Conjunctivae and EOM are normal. Pupils are equal, round, and reactive to light. Right eye exhibits no discharge. Left eye exhibits no discharge. No scleral icterus.  Neck: Normal range of motion. Neck supple. No JVD present. No tracheal deviation present. No thyromegaly present.  Cardiovascular: Normal rate, regular rhythm and normal heart sounds.   No murmur heard. Pulmonary/Chest: Effort normal and breath sounds normal. No stridor. No respiratory distress. She has no wheezes. She has no rales. She exhibits no tenderness.  Abdominal: Soft. Bowel sounds are normal. She exhibits no distension and no mass. There is no tenderness. There is no  rebound and no guarding.  Musculoskeletal: Normal range of motion. She exhibits no edema.  Lymphadenopathy:    She has no cervical adenopathy.  Neurological: She is alert and oriented to person, place, and time. No cranial nerve deficit. Coordination normal.  Skin: Skin is warm and dry. She is not diaphoretic.  Psychiatric: Mood, memory, affect and judgment normal.    LABORATORY DATA:  CBC    Component Value Date/Time   WBC 7.1 10/07/2014 1530   RBC 4.27 10/07/2014 1530   HGB 12.1* 12/22/2014 0904   HGB 12.0 10/07/2014 1530   HCT 37.0 10/07/2014 1530   PLT 282 10/07/2014 1530   MCV 86.7 10/07/2014 1530   MCH 28.1 10/07/2014 1530   MCHC 32.4 10/07/2014 1530   RDW 16.0* 10/07/2014 1530   LYMPHSABS 2.3 10/07/2014 1530   MONOABS 0.4 10/07/2014 1530   EOSABS 0.1 10/07/2014 1530   BASOSABS 0.0 10/07/2014 1530   CMP     Component Value Date/Time   NA 143 12/07/2014 1450   K 3.5 12/07/2014 1450   CL 106 12/07/2014 1450   CO2 28 12/07/2014 1450   GLUCOSE 180* 12/07/2014 1450   BUN 15 12/07/2014 1450   CREATININE 1.40* 12/07/2014 1450   CREATININE 1.39* 10/07/2014 1530   CALCIUM 9.7 12/07/2014 1450   PROT 6.4 11/25/2014  1052   ALBUMIN 4.1 11/25/2014 1052   AST 18 11/25/2014 1052   ALT 19 11/25/2014 1052   ALKPHOS 94 11/25/2014 1052   BILITOT 0.3 11/25/2014 1052   GFRNONAA 40* 10/07/2014 1530   GFRAA 46* 10/07/2014 1530     PENDING LABS: Ferritin CBC Iron studies   ASSESSMENT and THERAPY PLAN:   Iron 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 She was instructed that should she develop fatigue or pagophagia prior to any laboratory study appointments she is to notify us and we can check labs sooner. Her labs today are pending; we will call her when the results are available. If she needs additional IV iron we will set her up. Otherwise I will  formally see her back in 6 months.  All questions were answered. The patient knows to call the clinic with any problems, questions or concerns. We can certainly see the patient much sooner if necessary.  Molli Hazard 01/07/2015

## 2015-01-08 ENCOUNTER — Ambulatory Visit: Payer: 59 | Admitting: *Deleted

## 2015-01-08 VITALS — BP 152/102

## 2015-01-08 DIAGNOSIS — Z0130 Encounter for examination of blood pressure without abnormal findings: Secondary | ICD-10-CM

## 2015-01-08 LAB — IRON AND TIBC
Iron: 72 ug/dL (ref 42–145)
Saturation Ratios: 27 % (ref 20–55)
TIBC: 262 ug/dL (ref 250–470)
UIBC: 190 ug/dL (ref 125–400)

## 2015-01-08 LAB — FERRITIN: Ferritin: 353 ng/mL — ABNORMAL HIGH (ref 10–291)

## 2015-01-08 MED ORDER — DOXAZOSIN MESYLATE 4 MG PO TABS: 4.0000 mg | ORAL_TABLET | Freq: Two times a day (BID) | ORAL | Status: DC

## 2015-01-08 MED ORDER — INDAPAMIDE 1.25 MG PO TABS: 1.2500 mg | ORAL_TABLET | ORAL | Status: DC

## 2015-01-08 NOTE — Progress Notes (Signed)
Cbc drawn

## 2015-01-21 LAB — HM DIABETES EYE EXAM

## 2015-01-22 ENCOUNTER — Ambulatory Visit: Payer: 59 | Admitting: *Deleted

## 2015-01-22 DIAGNOSIS — I10 Essential (primary) hypertension: Secondary | ICD-10-CM

## 2015-01-22 MED ORDER — AMLODIPINE BESYLATE 2.5 MG PO TABS: 2.5000 mg | ORAL_TABLET | Freq: Every day | ORAL | Status: DC

## 2015-01-22 NOTE — Progress Notes (Signed)
Patient ID: Joanna [name], female   DOB: 1951/08/25, 64 y.o.   MRN: [id number] Pt arrives today for a nurse visit for blood pressure check up. bp today 130/94. Pt states yesterday at eye dr it was 135/88. Pt taking doxazosin 4mg  one bid and indapamide 1.25mg  one qam. Consult with Dr.Scott. Add amlodipine 2.5mg  one daily. Recheck in office with DR. Scott in 3 weeks. Discussed with pt. Med sent to pharm. appt scheduled.

## 2015-02-05 ENCOUNTER — Ambulatory Visit: Payer: 59 | Admitting: Family Medicine

## 2015-02-11 ENCOUNTER — Encounter: Payer: Self-pay | Admitting: Family Medicine

## 2015-02-11 ENCOUNTER — Ambulatory Visit (INDEPENDENT_AMBULATORY_CARE_PROVIDER_SITE_OTHER): Payer: 59 | Admitting: Family Medicine

## 2015-02-11 ENCOUNTER — Encounter: Payer: Self-pay | Admitting: *Deleted

## 2015-02-11 VITALS — BP 110/80 | Ht 62.0 in | Wt 192.0 lb

## 2015-02-11 DIAGNOSIS — G8929 Other chronic pain: Secondary | ICD-10-CM

## 2015-02-11 DIAGNOSIS — M7581 Other shoulder lesions, right shoulder: Secondary | ICD-10-CM

## 2015-02-11 DIAGNOSIS — M5416 Radiculopathy, lumbar region: Secondary | ICD-10-CM

## 2015-02-11 DIAGNOSIS — I10 Essential (primary) hypertension: Secondary | ICD-10-CM

## 2015-02-11 DIAGNOSIS — M25561 Pain in right knee: Secondary | ICD-10-CM

## 2015-02-11 MED ORDER — HYDROCODONE-ACETAMINOPHEN 10-325 MG PO TABS: 1.0000 | ORAL_TABLET | Freq: Four times a day (QID) | ORAL | Status: DC | PRN

## 2015-02-11 NOTE — Progress Notes (Signed)
   Subjective:    Patient ID: Joanna [name], female    DOB: 1951/08/25, 64 y.o.   MRN: [id number]  Hypertension This is a chronic problem. The current episode started more than 1 year ago. The problem has been gradually improving since onset. Pertinent negatives include no chest pain. There are no associated agents to hypertension. There are no known risk factors for coronary artery disease. Treatments tried: Cardura. There are no compliance problems.    This is a follow up blood pressure check.  Patient states that she is having right shoulder and right knee pain that has been present for several days now.  Review of Systems  Constitutional: Negative for activity change, appetite change and fatigue.  HENT: Negative for congestion.   Respiratory: Negative for cough.   Cardiovascular: Negative for chest pain.  Gastrointestinal: Negative for abdominal pain.  Endocrine: Negative for polydipsia and polyphagia.  Neurological: Negative for weakness.  Psychiatric/Behavioral: Negative for confusion.       Objective:   Physical Exam  Constitutional: She appears well-nourished. No distress.  Cardiovascular: Normal rate, regular rhythm and normal heart sounds.   No murmur heard. Pulmonary/Chest: Effort normal and breath sounds normal. No respiratory distress.  Musculoskeletal: She exhibits no edema.  Lymphadenopathy:    She has no cervical adenopathy.  Neurological: She is alert. She exhibits normal muscle tone.  Psychiatric: Her behavior is normal.  Vitals reviewed.         Assessment & Plan:   HTN blood pressure doing much better continue current measures watch diet closely follow-up 3-4 months    patient with right shoulder pain discomfort 10 9 is should gradually get better with range of motion exercises   intermittent right knee pain probably osteoarthritis Tylenol when necessary   Patient with chronic low back pain uses hydrocodone sparingly    I believe between this  patient's chronic low back pain for osteoarthritis underneath her rotator cuff trouble in the right shoulder along with chronic pain I do not believe this patient is capable working in I support her disability

## 2015-02-18 ENCOUNTER — Other Ambulatory Visit: Payer: Self-pay | Admitting: Family Medicine

## 2015-03-18 ENCOUNTER — Ambulatory Visit (INDEPENDENT_AMBULATORY_CARE_PROVIDER_SITE_OTHER): Payer: 59 | Admitting: Family Medicine

## 2015-03-18 ENCOUNTER — Encounter: Payer: Self-pay | Admitting: Family Medicine

## 2015-03-18 VITALS — BP 122/84 | Temp 98.5°F | Ht 62.0 in | Wt 195.4 lb

## 2015-03-18 DIAGNOSIS — J329 Chronic sinusitis, unspecified: Secondary | ICD-10-CM

## 2015-03-18 DIAGNOSIS — J310 Chronic rhinitis: Secondary | ICD-10-CM

## 2015-03-18 MED ORDER — FLUCONAZOLE 150 MG PO TABS: ORAL_TABLET | ORAL | Status: DC

## 2015-03-18 MED ORDER — ALBUTEROL SULFATE HFA 108 (90 BASE) MCG/ACT IN AERS: 2.0000 | INHALATION_SPRAY | Freq: Four times a day (QID) | RESPIRATORY_TRACT | Status: DC | PRN

## 2015-03-18 MED ORDER — CEFDINIR 300 MG PO CAPS: 300.0000 mg | ORAL_CAPSULE | Freq: Two times a day (BID) | ORAL | Status: DC

## 2015-03-18 NOTE — Progress Notes (Signed)
   Subjective:    Patient ID: Joanna [name], female    DOB: [birth year], 64 y.o.   MRN: [id number]  Sinusitis This is a new problem. The current episode started in the past 7 days. The problem is unchanged. There has been no fever. The pain is moderate. Associated symptoms include congestion, coughing, headaches and sneezing. (Wheezing) Past treatments include oral decongestants (cough syrup otc). The treatment provided no relief.   Was exposed to sick folks  No fever sore all thru out  Whole thing headache  Energy level   Cough off and on, coughing bad again  Does not smoke   Review of Systems  HENT: Positive for congestion and sneezing.   Respiratory: Positive for cough.   Neurological: Positive for headaches.       Objective:   Physical Exam Alert moderate malaise. HEENT frontal maxillary tenderness pharynx normal. Neck supple. Lungs clear. Heart regular in rhythm.       Assessment & Plan:  Impression post viral rhinosinusitis patient may well have had parainfluenza at the start plan symptom care discussed. And bites prescribed. Warning signs discussed. WSL

## 2015-03-26 ENCOUNTER — Encounter: Payer: Self-pay | Admitting: Family Medicine

## 2015-03-26 ENCOUNTER — Ambulatory Visit (INDEPENDENT_AMBULATORY_CARE_PROVIDER_SITE_OTHER): Payer: 59 | Admitting: Family Medicine

## 2015-03-26 VITALS — BP 130/80 | Ht 62.0 in | Wt 193.5 lb

## 2015-03-26 DIAGNOSIS — D509 Iron deficiency anemia, unspecified: Secondary | ICD-10-CM

## 2015-03-26 DIAGNOSIS — N289 Disorder of kidney and ureter, unspecified: Secondary | ICD-10-CM

## 2015-03-26 DIAGNOSIS — E119 Type 2 diabetes mellitus without complications: Secondary | ICD-10-CM | POA: Diagnosis not present

## 2015-03-26 DIAGNOSIS — G4700 Insomnia, unspecified: Secondary | ICD-10-CM

## 2015-03-26 DIAGNOSIS — I10 Essential (primary) hypertension: Secondary | ICD-10-CM | POA: Diagnosis not present

## 2015-03-26 DIAGNOSIS — E785 Hyperlipidemia, unspecified: Secondary | ICD-10-CM | POA: Diagnosis not present

## 2015-03-26 DIAGNOSIS — E038 Other specified hypothyroidism: Secondary | ICD-10-CM | POA: Diagnosis not present

## 2015-03-26 LAB — POCT GLYCOSYLATED HEMOGLOBIN (HGB A1C): Hemoglobin A1C: 6.1

## 2015-03-26 NOTE — Progress Notes (Signed)
   Subjective:    Patient ID: Joanna [name], female    DOB: [birth year], 64 y.o.   MRN: [id number]  Diabetes She presents for her follow-up diabetic visit. She has type 2 diabetes mellitus. There are no hypoglycemic associated symptoms. Pertinent negatives for hypoglycemia include no confusion. There are no diabetic associated symptoms. Pertinent negatives for diabetes include no fatigue, no polydipsia and no polyphagia. There are no hypoglycemic complications. There are no diabetic complications. There are no known risk factors for coronary artery disease. Current diabetic treatment includes oral agent (monotherapy). She is compliant with treatment all of the time.  Thyroid Problem Presents for follow-up visit. Patient reports no fatigue or hair loss. The symptoms have been stable. The treatment provided moderate relief. Her past medical history is significant for hyperlipidemia.  Hypertension This is a chronic problem. The current episode started more than 1 year ago. The problem has been gradually improving since onset. The problem is controlled. Risk factors for coronary artery disease include dyslipidemia and family history. The current treatment provides significant improvement. There are no compliance problems.  Hypertensive end-organ damage includes a thyroid problem.  Hyperlipidemia This is a chronic problem. The current episode started more than 1 year ago. The problem is controlled. Recent lipid tests were reviewed and are normal. Current antihyperlipidemic treatment includes statins. The current treatment provides significant improvement of lipids. There are no compliance problems.    Patient has no concerns at this time.    Review of Systems  Constitutional: Negative for activity change, appetite change and fatigue.  HENT: Negative for congestion.   Respiratory: Negative for cough.   Endocrine: Negative for polydipsia and polyphagia.  Psychiatric/Behavioral: Negative for confusion.        Objective:   Physical Exam  Constitutional: She appears well-nourished. No distress.  Cardiovascular: Normal rate, regular rhythm and normal heart sounds.   No murmur heard. Pulmonary/Chest: Effort normal and breath sounds normal. No respiratory distress.  Musculoskeletal: She exhibits no edema.  Lymphadenopathy:    She has no cervical adenopathy.  Neurological: She is alert. She exhibits normal muscle tone.  Psychiatric: Her behavior is normal.  Vitals reviewed.         Assessment & Plan:  1. Type 2 diabetes mellitus without complication 123456 under very good control no low sugars. Watch diet continue medications if low sugars notify us. - POCT glycosylated hemoglobin (Hb A1C)  2. Essential hypertension, benign Blood pressure very good today check metabolic profile - Basic metabolic panel  3. Anemia, iron deficiency History of iron deficient anemia check lab work later this month - CBC with Differential/Platelet - Ferritin - Iron Binding Cap (TIBC)  4. Renal insufficiency Patient with renal insufficiency on low-dose Lozol because of peripheral edema I recommend seeing how her creatinine is doing  5. Other specified hypothyroidism Thyroid function is been good in the past on medication continue medicine check lab work - TSH - T4, free  6. Insomnia Uses trazodone helps sleep continue this  7. Hyperlipidemia Continue with pravastatin. Check lipid profile. - Lipid panel  Overall patient doing very well follow-up 4 months

## 2015-03-27 LAB — BASIC METABOLIC PANEL
BUN/Creatinine Ratio: 15 (ref 11–26)
BUN: 21 mg/dL (ref 8–27)
CO2: 25 mmol/L (ref 18–29)
Calcium: 10.2 mg/dL (ref 8.7–10.3)
Chloride: 104 mmol/L (ref 97–108)
Creatinine, Ser: 1.41 mg/dL — ABNORMAL HIGH (ref 0.57–1.00)
GFR calc Af Amer: 46 mL/min/{1.73_m2} — ABNORMAL LOW (ref >59)
GFR calc non Af Amer: 40 mL/min/{1.73_m2} — ABNORMAL LOW (ref >59)
Glucose: 136 mg/dL — ABNORMAL HIGH (ref 65–99)
Potassium: 3.8 mmol/L (ref 3.5–5.2)
Sodium: 146 mmol/L — ABNORMAL HIGH (ref 134–144)

## 2015-03-27 LAB — IRON AND TIBC
Iron Saturation: 24 % (ref 15–55)
Iron: 68 ug/dL (ref 27–139)
Total Iron Binding Capacity: 278 ug/dL (ref 250–450)
UIBC: 210 ug/dL (ref 118–369)

## 2015-03-27 LAB — CBC WITH DIFFERENTIAL/PLATELET
Basophils Absolute: 0.1 10*3/uL (ref 0.0–0.2)
Basos: 1 %
EOS (ABSOLUTE): 0.1 10*3/uL (ref 0.0–0.4)
Eos: 2 %
Hematocrit: 36.2 % (ref 34.0–46.6)
Hemoglobin: 12.3 g/dL (ref 11.1–15.9)
Immature Grans (Abs): 0 10*3/uL (ref 0.0–0.1)
Immature Granulocytes: 0 %
Lymphocytes Absolute: 2.7 10*3/uL (ref 0.7–3.1)
Lymphs: 42 %
MCH: 28.7 pg (ref 26.6–33.0)
MCHC: 34 g/dL (ref 31.5–35.7)
MCV: 84 fL (ref 79–97)
Monocytes Absolute: 0.4 10*3/uL (ref 0.1–0.9)
Monocytes: 7 %
Neutrophils Absolute: 3.1 10*3/uL (ref 1.4–7.0)
Neutrophils: 48 %
Platelets: 277 10*3/uL (ref 150–379)
RBC: 4.29 x10E6/uL (ref 3.77–5.28)
RDW: 14.1 % (ref 12.3–15.4)
WBC: 6.4 10*3/uL (ref 3.4–10.8)

## 2015-03-27 LAB — LIPID PANEL
Chol/HDL Ratio: 2.9 ratio units (ref 0.0–4.4)
Cholesterol, Total: 149 mg/dL (ref 100–199)
HDL: 52 mg/dL (ref >39)
LDL Calculated: 72 mg/dL (ref 0–99)
Triglycerides: 125 mg/dL (ref 0–149)
VLDL Cholesterol Cal: 25 mg/dL (ref 5–40)

## 2015-03-27 LAB — FERRITIN: Ferritin: 309 ng/mL — ABNORMAL HIGH (ref 15–150)

## 2015-03-27 LAB — TSH: TSH: 2.06 u[IU]/mL (ref 0.450–4.500)

## 2015-03-27 LAB — T4, FREE: Free T4: 1.5 ng/dL (ref 0.82–1.77)

## 2015-03-28 ENCOUNTER — Encounter: Payer: Self-pay | Admitting: Family Medicine

## 2015-03-31 ENCOUNTER — Encounter: Payer: Self-pay | Admitting: Gastroenterology

## 2015-04-08 ENCOUNTER — Other Ambulatory Visit (HOSPITAL_COMMUNITY): Payer: 59

## 2015-04-16 ENCOUNTER — Encounter (HOSPITAL_COMMUNITY): Payer: Self-pay

## 2015-04-20 ENCOUNTER — Telehealth: Payer: Self-pay | Admitting: Family Medicine

## 2015-04-20 MED ORDER — LEVOTHYROXINE SODIUM 88 MCG PO TABS: ORAL_TABLET | ORAL | Status: DC

## 2015-04-20 MED ORDER — GLIPIZIDE 5 MG PO TABS: ORAL_TABLET | ORAL | Status: DC

## 2015-04-20 MED ORDER — LOSARTAN POTASSIUM 100 MG PO TABS: 100.0000 mg | ORAL_TABLET | Freq: Every day | ORAL | Status: DC

## 2015-04-20 NOTE — Telephone Encounter (Signed)
levothyroxine (SYNTHROID, LEVOTHROID) 88 MCG tablet glipiZIDE (GLUCOTROL) 5 MG tablet losartan (COZAAR) 100 MG tablet   Pt had to change pharmacies, now [facility] Need refills sent on these meds to them please

## 2015-04-20 NOTE — Telephone Encounter (Signed)
Rx sent electronically to pharmacy. Patient notified. 

## 2015-06-09 ENCOUNTER — Other Ambulatory Visit: Payer: Self-pay | Admitting: *Deleted

## 2015-06-09 ENCOUNTER — Telehealth: Payer: Self-pay | Admitting: Family Medicine

## 2015-06-09 MED ORDER — HYDROCODONE-ACETAMINOPHEN 10-325 MG PO TABS: 1.0000 | ORAL_TABLET | Freq: Four times a day (QID) | ORAL | Status: DC | PRN

## 2015-06-09 NOTE — Telephone Encounter (Signed)
May have prescription refill. Keep follow-up visit in October.

## 2015-06-09 NOTE — Telephone Encounter (Signed)
Pt notified script ready for pick up.

## 2015-06-09 NOTE — Telephone Encounter (Signed)
Pt needs a refill on her hydrocodone.

## 2015-06-23 ENCOUNTER — Other Ambulatory Visit: Payer: Self-pay | Admitting: Family Medicine

## 2015-07-08 ENCOUNTER — Other Ambulatory Visit (HOSPITAL_COMMUNITY): Payer: 59

## 2015-07-08 ENCOUNTER — Ambulatory Visit (HOSPITAL_COMMUNITY): Payer: 59 | Admitting: Hematology & Oncology

## 2015-07-08 NOTE — Progress Notes (Signed)
This encounter was created in error - please disregard.

## 2015-07-12 ENCOUNTER — Encounter (HOSPITAL_COMMUNITY): Payer: Self-pay

## 2015-07-20 ENCOUNTER — Other Ambulatory Visit: Payer: Self-pay | Admitting: Family Medicine

## 2015-07-26 ENCOUNTER — Encounter: Payer: Self-pay | Admitting: Family Medicine

## 2015-07-26 ENCOUNTER — Ambulatory Visit (INDEPENDENT_AMBULATORY_CARE_PROVIDER_SITE_OTHER): Payer: 59 | Admitting: Family Medicine

## 2015-07-26 VITALS — BP 118/86 | Ht 62.0 in | Wt 196.0 lb

## 2015-07-26 DIAGNOSIS — E039 Hypothyroidism, unspecified: Secondary | ICD-10-CM

## 2015-07-26 DIAGNOSIS — N289 Disorder of kidney and ureter, unspecified: Secondary | ICD-10-CM

## 2015-07-26 DIAGNOSIS — E119 Type 2 diabetes mellitus without complications: Secondary | ICD-10-CM

## 2015-07-26 DIAGNOSIS — K219 Gastro-esophageal reflux disease without esophagitis: Secondary | ICD-10-CM

## 2015-07-26 DIAGNOSIS — Z23 Encounter for immunization: Secondary | ICD-10-CM | POA: Diagnosis not present

## 2015-07-26 DIAGNOSIS — I10 Essential (primary) hypertension: Secondary | ICD-10-CM | POA: Diagnosis not present

## 2015-07-26 LAB — POCT GLYCOSYLATED HEMOGLOBIN (HGB A1C)
Hemoglobin A1C: 6.4
Hemoglobin A1C: 6.4

## 2015-07-26 MED ORDER — PRAVASTATIN SODIUM 20 MG PO TABS: 20.0000 mg | ORAL_TABLET | Freq: Every day | ORAL | Status: DC

## 2015-07-26 MED ORDER — GLIPIZIDE 5 MG PO TABS: ORAL_TABLET | ORAL | Status: DC

## 2015-07-26 MED ORDER — HYDROCODONE-ACETAMINOPHEN 10-325 MG PO TABS: 1.0000 | ORAL_TABLET | Freq: Four times a day (QID) | ORAL | Status: DC | PRN

## 2015-07-26 MED ORDER — TRAZODONE HCL 50 MG PO TABS: ORAL_TABLET | ORAL | Status: DC

## 2015-07-26 MED ORDER — LEVOTHYROXINE SODIUM 88 MCG PO TABS: ORAL_TABLET | ORAL | Status: DC

## 2015-07-26 MED ORDER — LOSARTAN POTASSIUM 100 MG PO TABS: 100.0000 mg | ORAL_TABLET | Freq: Every day | ORAL | Status: DC

## 2015-07-26 MED ORDER — ALPRAZOLAM 0.5 MG PO TABS: ORAL_TABLET | ORAL | Status: DC

## 2015-07-26 NOTE — Progress Notes (Signed)
   Subjective:    Patient ID: Joanna [name], female    DOB: Mar 15, 1951, 64 y.o.   MRN: [id number]  Diabetes She presents for her follow-up diabetic visit. She has type 2 diabetes mellitus. Pertinent negatives for hypoglycemia include no confusion. Pertinent negatives for diabetes include no chest pain, no fatigue, no polydipsia, no polyphagia and no weakness. She is compliant with treatment all of the time. She is following a diabetic diet. She never participates in exercise. She does not see a podiatrist.Eye exam is current (april 2016).  A1C 6.4. Patient's cholesterol overall looks good from June no need to repeat Renal insufficiency stable from June repeat on follow-up office visit Patient taken thyroid medicine recent lab work in June looked good  Needs refill on hydrocodone. Pt states she takes one about three times a week. Takes for back pain. Pain all the time. Worse with activity. Walking worse pain. Trying low impact walkinmg. Patient taken her cholesterol medicine tolerating it well Patient is taking blood pressure medicine on a regular basis without difficulty blood pressure good today Patient not having any low sugar spells. Takes tradazone 100mg  one half tablet at night and still feels drowsy the next day.   Flu vaccine today.   Review of Systems  Constitutional: Negative for activity change, appetite change and fatigue.  HENT: Negative for congestion.   Respiratory: Negative for cough.   Cardiovascular: Negative for chest pain.  Gastrointestinal: Negative for abdominal pain.  Endocrine: Negative for polydipsia and polyphagia.  Neurological: Negative for weakness.  Psychiatric/Behavioral: Negative for confusion.       Objective:   Physical Exam  Constitutional: She appears well-nourished. No distress.  Cardiovascular: Normal rate, regular rhythm and normal heart sounds.   No murmur heard. Pulmonary/Chest: Effort normal and breath sounds normal. No respiratory distress.    Musculoskeletal: She exhibits no edema.  Lymphadenopathy:    She has no cervical adenopathy.  Neurological: She is alert. She exhibits normal muscle tone.  Psychiatric: Her behavior is normal.  Vitals reviewed.         Assessment & Plan:  Chronic low back pain patient unable to work. Patient is disabled with this. I do not feel patient can do any type of gainful employment on a regular basis.refill of hydrocodone given. Follow-up again in 3-4 months. Blood pressure good continue current measures Insomnia reduce trazodone 50 mg take a half at nighttime if this doesn't help next step Ambien 1. Type 2 diabetes mellitus without complication, without long-term current use of insulin (HCC) A1c looks good continue current measures work hard at keeping it down - POCT glycosylated hemoglobin (Hb A1C) - POCT glycosylated hemoglobin (Hb A1C)  2. Encounter for immunization Flu vaccine today

## 2015-08-18 ENCOUNTER — Other Ambulatory Visit: Payer: Self-pay | Admitting: Family Medicine

## 2015-08-21 ENCOUNTER — Other Ambulatory Visit: Payer: Self-pay | Admitting: Family Medicine

## 2015-08-31 ENCOUNTER — Other Ambulatory Visit: Payer: Self-pay | Admitting: Family Medicine

## 2015-09-19 ENCOUNTER — Other Ambulatory Visit: Payer: Self-pay | Admitting: Family Medicine

## 2015-10-22 ENCOUNTER — Other Ambulatory Visit: Payer: Self-pay | Admitting: Family Medicine

## 2015-12-22 ENCOUNTER — Other Ambulatory Visit: Payer: Self-pay | Admitting: Family Medicine

## 2015-12-23 ENCOUNTER — Ambulatory Visit (INDEPENDENT_AMBULATORY_CARE_PROVIDER_SITE_OTHER): Payer: BLUE CROSS/BLUE SHIELD | Admitting: Family Medicine

## 2015-12-23 ENCOUNTER — Encounter: Payer: Self-pay | Admitting: Family Medicine

## 2015-12-23 VITALS — BP 112/88 | Ht 62.0 in | Wt 199.0 lb

## 2015-12-23 DIAGNOSIS — I10 Essential (primary) hypertension: Secondary | ICD-10-CM | POA: Diagnosis not present

## 2015-12-23 DIAGNOSIS — G4700 Insomnia, unspecified: Secondary | ICD-10-CM

## 2015-12-23 DIAGNOSIS — E119 Type 2 diabetes mellitus without complications: Secondary | ICD-10-CM | POA: Diagnosis not present

## 2015-12-23 DIAGNOSIS — E785 Hyperlipidemia, unspecified: Secondary | ICD-10-CM | POA: Diagnosis not present

## 2015-12-23 DIAGNOSIS — E039 Hypothyroidism, unspecified: Secondary | ICD-10-CM | POA: Diagnosis not present

## 2015-12-23 DIAGNOSIS — M1000 Idiopathic gout, unspecified site: Secondary | ICD-10-CM

## 2015-12-23 LAB — POCT GLYCOSYLATED HEMOGLOBIN (HGB A1C): Hemoglobin A1C: 6.1

## 2015-12-23 MED ORDER — HYDROCODONE-ACETAMINOPHEN 10-325 MG PO TABS: 1.0000 | ORAL_TABLET | Freq: Four times a day (QID) | ORAL | Status: DC | PRN

## 2015-12-23 MED ORDER — ZOLPIDEM TARTRATE 5 MG PO TABS: 5.0000 mg | ORAL_TABLET | Freq: Every evening | ORAL | Status: DC | PRN

## 2015-12-23 NOTE — Progress Notes (Signed)
   Subjective:    Patient ID: Joanna [name], female    DOB: 12-Feb-1951, 65 y.o.   MRN: [id number]  Diabetes She presents for her follow-up diabetic visit. She has type 2 diabetes mellitus. There are no hypoglycemic associated symptoms. There are no diabetic associated symptoms. There are no hypoglycemic complications. There are no diabetic complications. There are no known risk factors for coronary artery disease. Current diabetic treatment includes oral agent (monotherapy). She is compliant with treatment all of the time.   Patient states that she has had some gout attacks. Having 2 or 3 per month. Describes minimal right foot. L1 time had to be on regular medications for this. Patient not really aware of dietary intake of excess meat.  Patient states that she is having knee pain and back pain. Chronic low back pain and chronic knee pain takes pain medication take the edge off she struggles with her weight. She knows that her weight puts increased pressure on this. She has had surgeries and injections of her back without much help   Patient would like to discuss weight loss options also.  Her weight is gone up she did tries to watch how she eats she is not able to exercise on a regular basis because of her back and knee pain   Insomnia -she has difficult time with sleep at night she has a hard time falling asleep in the past Ambien did help she would like to try Ambien again   Review of Systems  she denies any chest tightness pressure pain shortness breath nausea vomiting diarrhea she does relate intermittent gout attacks in her foot. Denies any low sugar spells.    Objective:   Physical Exam  lungs clear heart regular pulse normal abdomen obese extremities no edema diabetic foot exam normal      25 minutes was spent with the patient. Greater than half the time was spent in discussion and answering questions and counseling regarding the issues that the patient came in for  today.  Assessment & Plan:  1. Type 2 diabetes mellitus without complication, without long-term current use of insulin (HCC) We did discuss Victoza patient will look into this and let me know for now stick with current medication watch diet closely - POCT glycosylated hemoglobin (Hb A1C) - Lipid panel - Hepatic function panel - Basic metabolic panel - TSH - Microalbumin / creatinine urine ratio - Uric acid  2. Essential hypertension, benign Blood pressure good control continue current approach - Lipid panel - Hepatic function panel - Basic metabolic panel - TSH - Microalbumin / creatinine urine ratio - Uric acid  3. Type 2 diabetes mellitus with HbA1C goal below 7.5 See discussion above encourage healthy diet regular physical activity weight loss - Lipid panel - Hepatic function panel - Basic metabolic panel - TSH - Microalbumin / creatinine urine ratio - Uric acid  4. Hypothyroidism, unspecified hypothyroidism type Continue medication check TSH - Lipid panel - Hepatic function panel - Basic metabolic panel - TSH - Microalbumin / creatinine urine ratio - Uric acid  5. Hyperlipidemia Continue medication check lab work watch diet closely - Lipid panel - Hepatic function panel - Basic metabolic panel - TSH - Microalbumin / creatinine urine ratio - Uric acid  6. Insomnia Recommend Ambien 5 mg daily at bedtime.  Gout-check uric acid level

## 2015-12-24 LAB — BASIC METABOLIC PANEL
BUN/Creatinine Ratio: 13 (ref 11–26)
BUN: 17 mg/dL (ref 8–27)
CO2: 22 mmol/L (ref 18–29)
Calcium: 9.9 mg/dL (ref 8.7–10.3)
Chloride: 103 mmol/L (ref 96–106)
Creatinine, Ser: 1.33 mg/dL — ABNORMAL HIGH (ref 0.57–1.00)
GFR calc Af Amer: 49 mL/min/{1.73_m2} — ABNORMAL LOW (ref >59)
GFR calc non Af Amer: 43 mL/min/{1.73_m2} — ABNORMAL LOW (ref >59)
Glucose: 148 mg/dL — ABNORMAL HIGH (ref 65–99)
Potassium: 3.6 mmol/L (ref 3.5–5.2)
Sodium: 144 mmol/L (ref 134–144)

## 2015-12-24 LAB — HEPATIC FUNCTION PANEL
ALT: 21 IU/L (ref 0–32)
AST: 25 IU/L (ref 0–40)
Albumin: 4.1 g/dL (ref 3.6–4.8)
Alkaline Phosphatase: 84 IU/L (ref 39–117)
Bilirubin Total: 0.2 mg/dL (ref 0.0–1.2)
Bilirubin, Direct: 0.1 mg/dL (ref 0.00–0.40)
Total Protein: 6.7 g/dL (ref 6.0–8.5)

## 2015-12-24 LAB — LIPID PANEL
Chol/HDL Ratio: 2.7 ratio units (ref 0.0–4.4)
Cholesterol, Total: 145 mg/dL (ref 100–199)
HDL: 54 mg/dL (ref >39)
LDL Calculated: 62 mg/dL (ref 0–99)
Triglycerides: 145 mg/dL (ref 0–149)
VLDL Cholesterol Cal: 29 mg/dL (ref 5–40)

## 2015-12-24 LAB — MICROALBUMIN / CREATININE URINE RATIO
Creatinine, Urine: 224.2 mg/dL
MICROALB/CREAT RATIO: 127.3 mg/g creat — ABNORMAL HIGH (ref 0.0–30.0)
Microalbumin, Urine: 285.5 ug/mL

## 2015-12-24 LAB — TSH: TSH: 1.38 u[IU]/mL (ref 0.450–4.500)

## 2015-12-24 LAB — URIC ACID: Uric Acid: 5.1 mg/dL (ref 2.5–7.1)

## 2015-12-29 ENCOUNTER — Telehealth: Payer: Self-pay | Admitting: Family Medicine

## 2015-12-29 LAB — HM DIABETES EYE EXAM

## 2015-12-29 MED ORDER — LEVOTHYROXINE SODIUM 88 MCG PO TABS: ORAL_TABLET | ORAL | Status: DC

## 2015-12-29 MED ORDER — AMLODIPINE BESYLATE 2.5 MG PO TABS: 2.5000 mg | ORAL_TABLET | Freq: Every day | ORAL | Status: DC

## 2015-12-29 MED ORDER — OMEPRAZOLE 40 MG PO CPDR: DELAYED_RELEASE_CAPSULE | ORAL | Status: DC

## 2015-12-29 MED ORDER — LOSARTAN POTASSIUM 100 MG PO TABS: 100.0000 mg | ORAL_TABLET | Freq: Every day | ORAL | Status: DC

## 2015-12-29 MED ORDER — INDAPAMIDE 1.25 MG PO TABS: 1.2500 mg | ORAL_TABLET | Freq: Every morning | ORAL | Status: DC

## 2015-12-29 MED ORDER — PRAVASTATIN SODIUM 20 MG PO TABS: 20.0000 mg | ORAL_TABLET | Freq: Every day | ORAL | Status: DC

## 2015-12-29 MED ORDER — DOXAZOSIN MESYLATE 4 MG PO TABS: 4.0000 mg | ORAL_TABLET | Freq: Two times a day (BID) | ORAL | Status: DC

## 2015-12-29 MED ORDER — GLIPIZIDE 5 MG PO TABS: ORAL_TABLET | ORAL | Status: DC

## 2015-12-29 NOTE — Telephone Encounter (Signed)
It is possible that Victoza could allow her to be totally off of glipizide. I would recommend starting off Victoza 1.2 mg subcutaneous daily. I would also recommend stopping glipizide. In addition to this I recommend the patient periodically check her sugars in the morning and periodically check her sugars 2 hours after supper. She should check her morning sugars at least 3 days a week and later in the day at least 2 days a week. She should keep these on a sheet of paper and forward those readings to [place] in approximately 3 weeks. Then we can make a decision if the Victoza needs to be increased. It is important for the patient to know that a small number of people cannot tolerate Victoza because of nausea if this causes intense nausea she might 1 a consider stopping it. Also if it causes severe abdominal pain that would be necessary for her to let us know because sometimes it can trigger pancreatitis. The vast majority of people who take this medicine tolerate it well. If the patient needs help in being able to do the injection she may pick up her medicine schedule a nurse visit and have first injection completed here. Often the pharmacy will show the patient how to do an injection. Once she starts Victoza she needs to stop glipizide. Should she have any problems she is to let us know. She may have one month of Victoza with 3 refills. Standard follow-up visit in 4 to 6 weeks but patient should give [place] an update on how she is doing along with her glucose readings in 2 to 3 weeks.

## 2015-12-29 NOTE — Telephone Encounter (Signed)
Pt is calling to say her meds need to be sent to [facility]

## 2015-12-29 NOTE — Telephone Encounter (Signed)
Patient received 30 day supply of controlled substances at office visit last week.

## 2015-12-29 NOTE — Telephone Encounter (Signed)
Obviously controlled meds 30 day rx

## 2015-12-29 NOTE — Telephone Encounter (Signed)
Patient stated she has decided to try the [place] and would like the rx sent to mail order pharmacy.

## 2015-12-29 NOTE — Telephone Encounter (Addendum)
Other rxs [facility]. Patient notified.

## 2015-12-29 NOTE — Telephone Encounter (Signed)
plz assist pt 90 day with 2 rf fine with me

## 2015-12-30 MED ORDER — LIRAGLUTIDE 18 MG/3ML ~~LOC~~ SOPN: 1.2000 mg | PEN_INJECTOR | Freq: Every day | SUBCUTANEOUS | Status: DC

## 2015-12-30 NOTE — Telephone Encounter (Signed)
Notified patient it is possible that Victoza could allow her to be totally off of glipizide. Dr. Nicki Reaper would recommend starting off Victoza 1.2 mg subcutaneous daily. Dr. Nicki Reaper would also recommend stopping glipizide. In addition to this Dr. Nicki Reaper recommend the patient periodically check her sugars in the morning and periodically check her sugars 2 hours after supper. She should check her morning sugars at least 3 days a week and later in the day at least 2 days a week. She should keep these on a sheet of paper and forward those readings to [place] in approximately 3 weeks. Then we can make a decision if the Victoza needs to be increased. It is important for the patient to know that a small number of people cannot tolerate Victoza because of nausea if this causes intense nausea she might 1 a consider stopping it. Also if it causes severe abdominal pain that would be necessary for her to let us know because sometimes it can trigger pancreatitis. The vast majority of people who take this medicine tolerate it well. If the patient needs help in being able to do the injection she may pick up her medicine schedule a nurse visit and have first injection completed here. Often the pharmacy will show the patient how to do an injection. Once she starts Victoza she needs to stop glipizide. Should she have any problems she is to let us know. She may have one month of Victoza with 3 refills. Standard follow-up visit in 4 to 6 weeks but patient should give [place] an update on how she is doing along with her glucose readings in 2 to 3 weeks. Patient verbalized understanding. Patient wants script printed out and she will pick up later today.

## 2016-01-04 ENCOUNTER — Telehealth: Payer: Self-pay | Admitting: Family Medicine

## 2016-01-04 NOTE — Telephone Encounter (Signed)
Med sent to pharm. Pt notified.  

## 2016-01-04 NOTE — Telephone Encounter (Signed)
Pt is needing the needles for the victoza called in to [facility].

## 2016-01-27 ENCOUNTER — Other Ambulatory Visit: Payer: Self-pay | Admitting: Family Medicine

## 2016-02-04 ENCOUNTER — Telehealth: Payer: Self-pay | Admitting: Family Medicine

## 2016-02-04 NOTE — Telephone Encounter (Signed)
Pt dropped off glucose readings. In yellow folder in Dr's office.

## 2016-02-07 ENCOUNTER — Other Ambulatory Visit: Payer: Self-pay | Admitting: Family Medicine

## 2016-02-07 NOTE — Telephone Encounter (Signed)
Patient currently using Victoza. My understanding she has stop glipizide. Please verify that she has stop glipizide. Let her know her readings overall look good a few of him are higher than what I would like but most of them look good I would recommend that she periodically check sugars 2-3 times per week in the morning 2-3 times per week in the evening I recommend that she send [place] some readings again in 3-4 weeks time along with any concerns or problems she is having with the medicine. Possibly we will go up on the dose of the medicine at that time for now maintain at 1.2

## 2016-02-08 NOTE — Telephone Encounter (Signed)
[facility]

## 2016-02-08 NOTE — Telephone Encounter (Signed)
Patient is currently using Victoza. Patient states she has stopped glipizide. Notified patient her readings overall look good a few of them are higher than what Dr. Nicki Reaper would like but most of them look good he would recommend that she periodically check sugars 2-3 times per week in the morning 2-3 times per week in the evening recommend that she send [place] some readings again in 3-4 weeks time along with any concerns or problems she is having with the medicine. Possibly we will go up on the dose of the medicine at that time for now maintain at 1.2. Patient verbalized understanding.

## 2016-02-23 ENCOUNTER — Encounter: Payer: Self-pay | Admitting: Nurse Practitioner

## 2016-02-23 ENCOUNTER — Ambulatory Visit (INDEPENDENT_AMBULATORY_CARE_PROVIDER_SITE_OTHER): Payer: BLUE CROSS/BLUE SHIELD | Admitting: Nurse Practitioner

## 2016-02-23 VITALS — BP 112/74 | Ht 62.0 in | Wt 195.5 lb

## 2016-02-23 DIAGNOSIS — B372 Candidiasis of skin and nail: Secondary | ICD-10-CM

## 2016-02-23 DIAGNOSIS — E119 Type 2 diabetes mellitus without complications: Secondary | ICD-10-CM

## 2016-02-23 MED ORDER — NYSTATIN 100000 UNIT/GM EX CREA: 1.0000 "application " | TOPICAL_CREAM | Freq: Two times a day (BID) | CUTANEOUS | Status: DC

## 2016-02-23 MED ORDER — HYDROCODONE-ACETAMINOPHEN 10-325 MG PO TABS: 1.0000 | ORAL_TABLET | Freq: Four times a day (QID) | ORAL | Status: DC | PRN

## 2016-02-23 MED ORDER — ALPRAZOLAM 0.5 MG PO TABS: ORAL_TABLET | ORAL | Status: DC

## 2016-02-23 NOTE — Progress Notes (Signed)
Subjective:  Presents for c/o itching and burning around the border of the lips x 3 weeks. Has been very dry. Using petroleum jelly. Much improved. Some small bumps at times. No lesions or soreness inside the mouth. Requesting a new glucose testing meter and strips. Also needs refills on pain med and Xanax. Takes both very sparingly.   Objective:   BP 112/74 mmHg  Ht 5\' 2"  (1.575 m)  Wt 195 lb 8 oz (88.678 kg)  BMI 35.75 kg/m2 NAD. Alert, oriented. Faint pink mildly hyperpigmented area around the lips. No fissures noted. No excessive dryness at this time.   Assessment: Yeast dermatitis  Type 2 diabetes mellitus without complication, without long-term current use of insulin (HCC) - Plan: HYDROcodone-acetaminophen (NORCO) 10-325 MG tablet  Plan:  Meds ordered this encounter  Medications  . HYDROcodone-acetaminophen (NORCO) 10-325 MG tablet    Sig: Take 1 tablet by mouth every 6 (six) hours as needed.    Dispense:  60 tablet    Refill:  0    Use sparingly, no greater 2 per day    Order Specific Question:  Supervising Provider    Answer:  [name] [[id number]]  . ALPRAZolam (XANAX) 0.5 MG tablet    Sig: 1/2 to 1 po BID prn anxiety    Dispense:  30 tablet    Refill:  2    Order Specific Question:  Supervising Provider    Answer:  [name] [[id number]]  . nystatin cream (MYCOSTATIN)    Sig: Apply 1 application topically 2 (two) times daily. To affected area prn    Dispense:  30 g    Refill:  0    Order Specific Question:  Supervising Provider    Answer:  [name] [[id number]]   Call back if persists. Given Rx for glucose testing machine and strips.  Use meds sparingly; do not take together or with Ambien. Patient verbalizes understanding.

## 2016-04-21 ENCOUNTER — Ambulatory Visit: Payer: BLUE CROSS/BLUE SHIELD | Admitting: Family Medicine

## 2016-04-27 ENCOUNTER — Ambulatory Visit: Payer: BLUE CROSS/BLUE SHIELD | Admitting: Family Medicine

## 2016-05-05 ENCOUNTER — Ambulatory Visit (INDEPENDENT_AMBULATORY_CARE_PROVIDER_SITE_OTHER): Payer: BLUE CROSS/BLUE SHIELD | Admitting: Family Medicine

## 2016-05-05 VITALS — BP 128/88 | Ht 62.0 in | Wt 190.0 lb

## 2016-05-05 DIAGNOSIS — G4700 Insomnia, unspecified: Secondary | ICD-10-CM

## 2016-05-05 DIAGNOSIS — I10 Essential (primary) hypertension: Secondary | ICD-10-CM | POA: Diagnosis not present

## 2016-05-05 DIAGNOSIS — E039 Hypothyroidism, unspecified: Secondary | ICD-10-CM | POA: Diagnosis not present

## 2016-05-05 DIAGNOSIS — R5383 Other fatigue: Secondary | ICD-10-CM | POA: Diagnosis not present

## 2016-05-05 DIAGNOSIS — E119 Type 2 diabetes mellitus without complications: Secondary | ICD-10-CM | POA: Diagnosis not present

## 2016-05-05 DIAGNOSIS — E785 Hyperlipidemia, unspecified: Secondary | ICD-10-CM

## 2016-05-05 DIAGNOSIS — M1000 Idiopathic gout, unspecified site: Secondary | ICD-10-CM

## 2016-05-05 LAB — POCT GLYCOSYLATED HEMOGLOBIN (HGB A1C): Hemoglobin A1C: 6.7

## 2016-05-05 LAB — POCT HEMOGLOBIN: Hemoglobin: 12.1 g/dL — AB (ref 12.2–16.2)

## 2016-05-05 MED ORDER — ALPRAZOLAM 0.5 MG PO TABS: ORAL_TABLET | ORAL | Status: DC

## 2016-05-05 MED ORDER — LIRAGLUTIDE 18 MG/3ML ~~LOC~~ SOPN: 1.2000 mg | PEN_INJECTOR | Freq: Every day | SUBCUTANEOUS | Status: DC

## 2016-05-05 MED ORDER — NYSTATIN 100000 UNIT/GM EX CREA: 1.0000 "application " | TOPICAL_CREAM | Freq: Two times a day (BID) | CUTANEOUS | Status: DC

## 2016-05-05 MED ORDER — HYDROCODONE-ACETAMINOPHEN 10-325 MG PO TABS: 1.0000 | ORAL_TABLET | Freq: Four times a day (QID) | ORAL | Status: DC | PRN

## 2016-05-05 MED ORDER — LEVOTHYROXINE SODIUM 88 MCG PO TABS: ORAL_TABLET | ORAL | Status: DC

## 2016-05-05 MED ORDER — OMEPRAZOLE 40 MG PO CPDR: DELAYED_RELEASE_CAPSULE | ORAL | Status: DC

## 2016-05-05 MED ORDER — ZOLPIDEM TARTRATE 5 MG PO TABS: 5.0000 mg | ORAL_TABLET | Freq: Every evening | ORAL | Status: DC | PRN

## 2016-05-05 MED ORDER — INDAPAMIDE 1.25 MG PO TABS: 1.2500 mg | ORAL_TABLET | Freq: Every morning | ORAL | Status: DC

## 2016-05-05 MED ORDER — AMLODIPINE BESYLATE 2.5 MG PO TABS: 2.5000 mg | ORAL_TABLET | Freq: Every day | ORAL | Status: DC

## 2016-05-05 MED ORDER — LOSARTAN POTASSIUM 100 MG PO TABS: 100.0000 mg | ORAL_TABLET | Freq: Every day | ORAL | Status: DC

## 2016-05-05 MED ORDER — PRAVASTATIN SODIUM 20 MG PO TABS: 20.0000 mg | ORAL_TABLET | Freq: Every day | ORAL | Status: DC

## 2016-05-05 MED ORDER — DOXAZOSIN MESYLATE 4 MG PO TABS: 4.0000 mg | ORAL_TABLET | Freq: Two times a day (BID) | ORAL | Status: DC

## 2016-05-05 NOTE — Progress Notes (Signed)
Subjective:    Patient ID: Joanna [name], female    DOB: 1951-04-30, 65 y.o.   MRN: [id number]  Diabetes She presents for her follow-up diabetic visit. She has type 2 diabetes mellitus. Pertinent negatives for hypoglycemia include no headaches, mood changes, nervousness/anxiousness or pallor. Associated symptoms include fatigue and polyuria. Pertinent negatives for diabetes include no blurred vision, no chest pain, no foot ulcerations, no polydipsia and no polyphagia. Pertinent negatives for hypoglycemia complications include no blackouts. Risk factors for coronary artery disease include diabetes mellitus and dyslipidemia. Current diabetic treatment includes diet (Also on other treatments). She is compliant with treatment all of the time. Her weight is stable. She is following a diabetic diet. She rarely participates in exercise. Home blood sugar record trend: pt brought in blood sugar log. She does not see a podiatrist.Eye exam is current.  A1C 6.7  Right leg pain. Started 2 months ago.  Lower back and into the right leg that radiates down the right leg. Also with pain and discomfort with sitting standing and movement. In addition to this chronic pain in her back that makes her feel bad and at times makes her feel depressed denies being suicidal. Patient does use pain medication intermittently when necessary. Patient unable to do any significant lifting twisting bending. Patient also finds herself feeling distracted by the pain difficult time focusing difficult time staying on track.  Needs refill on nystatin for yeast infection on lip.  Refill given.  Fatigue. Wanted hb checked. 12.1 Hemoglobin is good patient has had other labs in the past that do not feel other labs are necessary right at the moment  Bilateral ankle swelling for 2 weeks.  Intermittent. Not all the time.  Needs refills on all meds. Some at mail order and some at [facility].  Refills were given.   Review of Systems    Constitutional: Positive for fatigue.  Eyes: Negative for blurred vision.  Cardiovascular: Negative for chest pain.  Endocrine: Positive for polyuria. Negative for polydipsia and polyphagia.  Skin: Negative for pallor.  Neurological: Negative for headaches.  Psychiatric/Behavioral: The patient is not nervous/anxious.        Objective:   Physical Exam  neck no masses lungs are clear no crackles heart regular increased pain in the right leg positive straight leg raise increase low back pain extremities no edema skin warm dry diabetic foot exam normal       Assessment & Plan:  1. Type 2 diabetes mellitus without complication, unspecified long term insulin use status (HCC)  A1c overall looks good she will do comprehensive lab work before next visit in 3-4 months - POCT glycosylated hemoglobin (Hb A1C) - CBC with Differential/Platelet - Lipid panel - Hepatic function panel - TSH - Basic metabolic panel - Hemoglobin A1c  2. Other fatigue  patient was encouraged to try to exercise but she's very limited in what she can do because of her severe back pain and sciatica - POCT hemoglobin - CBC with Differential/Platelet - Lipid panel - Hepatic function panel - TSH - Basic metabolic panel - Hemoglobin A1c  3. Type 2 diabetes mellitus without complication, without long-term current use of insulin (HCC)  diabetes doing well watch diet - HYDROcodone-acetaminophen (NORCO) 10-325 MG tablet; Take 1 tablet by mouth every 6 (six) hours as needed.  Dispense: 60 tablet; Refill: 0 - zolpidem (AMBIEN) 5 MG tablet; Take 1 tablet (5 mg total) by mouth at bedtime as needed for sleep.  Dispense: 30 tablet; Refill: 5 -  CBC with Differential/Platelet - Lipid panel - Hepatic function panel - TSH - Basic metabolic panel - Hemoglobin A1c  4. Essential hypertension, benign  blood pressure overall under good control avoid excessive salt - zolpidem (AMBIEN) 5 MG tablet; Take 1 tablet (5 mg total) by  mouth at bedtime as needed for sleep.  Dispense: 30 tablet; Refill: 5 - CBC with Differential/Platelet - Lipid panel - Hepatic function panel - TSH - Basic metabolic panel - Hemoglobin A1c  5. Type 2 diabetes mellitus with HbA1C goal below 7.5  see above - zolpidem (AMBIEN) 5 MG tablet; Take 1 tablet (5 mg total) by mouth at bedtime as needed for sleep.  Dispense: 30 tablet; Refill: 5 - CBC with Differential/Platelet - Lipid panel - Hepatic function panel - TSH - Basic metabolic panel - Hemoglobin A1c  6. Hypothyroidism, unspecified hypothyroidism type  thyroid under good control continue medication check lab work coming up previous labs including cholesterol thyroid kidney function reviewed with patient - zolpidem (AMBIEN) 5 MG tablet; Take 1 tablet (5 mg total) by mouth at bedtime as needed for sleep.  Dispense: 30 tablet; Refill: 5 - CBC with Differential/Platelet - Lipid panel - Hepatic function panel - TSH - Basic metabolic panel - Hemoglobin A1c  7. Hyperlipidemia  previous lab work reviewed future lab work ordered continue medication watch diet - zolpidem (AMBIEN) 5 MG tablet; Take 1 tablet (5 mg total) by mouth at bedtime as needed for sleep.  Dispense: 30 tablet; Refill: 5 - CBC with Differential/Platelet - Lipid panel - Hepatic function panel - TSH - Basic metabolic panel - Hemoglobin A1c  8. Insomnia  patient may use Ambien at nighttime to help her with sleep. - zolpidem (AMBIEN) 5 MG tablet; Take 1 tablet (5 mg total) by mouth at bedtime as needed for sleep.  Dispense: 30 tablet; Refill: 5 - CBC with Differential/Platelet - Lipid panel - Hepatic function panel - TSH - Basic metabolic panel - Hemoglobin A1c  9. Idiopathic gout, unspecified chronicity, unspecified site  I do not feel patient having gout currently - zolpidem (AMBIEN) 5 MG tablet; Take 1 tablet (5 mg total) by mouth at bedtime as needed for sleep.  Dispense: 30 tablet; Refill: 5 - CBC with  Differential/Platelet - Lipid panel - Hepatic function panel - TSH - Basic metabolic panel - Hemoglobin A1c   chronic back pain and sciatica pain medications when necessary Tylenol as needed avoid excessive use if ongoing troubles discuss with [place] gabapentin versus Lyrica we did talk about this today patient is thinking about it  Long discussion held regarding disability this patient is not capable working. There is no way that she would be able to maintain a steady job. Her back and her sciatica would flareup too often and would cause her to miss work too often to be able to work full-time in addition to this she has way too many limitations she cannot sit for more than 20 or 30 minutes without having to stand or move she cannot stand more than 2030 minutes at a time without having to lay down in addition to this she cannot move her legs frequently cannot go up and down ladders or steps or inclines. She cannot push or pull because of her back pain. In addition this the pain causes enough mental distraction keep her from being able to function properly at her workplace. Forms were filled out as requested by the patient see the patient back with him 4 months

## 2016-05-09 ENCOUNTER — Telehealth: Payer: Self-pay | Admitting: Family Medicine

## 2016-05-09 NOTE — Telephone Encounter (Signed)
Per patient's request her disability forms were filled out for physical as well as mental. Please put a copy into the chart please also for the originals to the patient it is permissible to call her and have her pick these up.

## 2016-05-22 DIAGNOSIS — Z0289 Encounter for other administrative examinations: Secondary | ICD-10-CM

## 2016-05-26 ENCOUNTER — Ambulatory Visit (INDEPENDENT_AMBULATORY_CARE_PROVIDER_SITE_OTHER): Payer: BLUE CROSS/BLUE SHIELD | Admitting: Family Medicine

## 2016-05-26 ENCOUNTER — Encounter: Payer: Self-pay | Admitting: Family Medicine

## 2016-05-26 VITALS — BP 128/84 | Ht 62.0 in | Wt 190.0 lb

## 2016-05-26 DIAGNOSIS — I951 Orthostatic hypotension: Secondary | ICD-10-CM | POA: Diagnosis not present

## 2016-05-26 DIAGNOSIS — E611 Iron deficiency: Secondary | ICD-10-CM | POA: Diagnosis not present

## 2016-05-26 DIAGNOSIS — M25561 Pain in right knee: Secondary | ICD-10-CM

## 2016-05-26 NOTE — Progress Notes (Signed)
   Subjective:    Patient ID: Joanna [name], female    DOB: January 12, 1951, 64 y.o.   MRN: [id number]  HPI  Patient arrives with c/o right knee and leg pain for 2 weeks-leg will give out on her and she almost falls at times. See below PMH HTN diabetes hypothyroidism Review of Systems Right knee locks gives way falls denies chest tightness pressure pain shortness breath denies wheezing difficulty breathing relates fatigue tiredness relates eats a lot of ice thinks she may have iron deficient anemia she has a history of this in addition to this feels lightheaded when she stands up    Objective:   Physical Exam Lungs clear heart regular pulse normal blood pressure is low is worse when she stands approximately 110/70 sitting and 90/60 standing extremities no edema right knee subjective discomfort and stiffness no swelling noted       Assessment & Plan:  Possible iron deficient anemia-lab work indicated await the results  Right knee locks referral to orthopedic surgery Orthostatic hypotension stop Doxyazo recheck in approximately recheck in a proximally 5 weekssin

## 2016-05-26 NOTE — Patient Instructions (Signed)
Stop doxazosin  Recheck here in about 6 weeks  We will set up ov with [facility]

## 2016-05-27 LAB — CBC WITH DIFFERENTIAL/PLATELET
Basophils Absolute: 0 10*3/uL (ref 0.0–0.2)
Basos: 1 %
EOS (ABSOLUTE): 0.1 10*3/uL (ref 0.0–0.4)
Eos: 2 %
Hematocrit: 36 % (ref 34.0–46.6)
Hemoglobin: 11.9 g/dL (ref 11.1–15.9)
Immature Grans (Abs): 0 10*3/uL (ref 0.0–0.1)
Immature Granulocytes: 0 %
Lymphocytes Absolute: 2 10*3/uL (ref 0.7–3.1)
Lymphs: 36 %
MCH: 26.9 pg (ref 26.6–33.0)
MCHC: 33.1 g/dL (ref 31.5–35.7)
MCV: 81 fL (ref 79–97)
Monocytes Absolute: 0.4 10*3/uL (ref 0.1–0.9)
Monocytes: 7 %
Neutrophils Absolute: 3.1 10*3/uL (ref 1.4–7.0)
Neutrophils: 54 %
Platelets: 313 10*3/uL (ref 150–379)
RBC: 4.43 x10E6/uL (ref 3.77–5.28)
RDW: 15.4 % (ref 12.3–15.4)
WBC: 5.7 10*3/uL (ref 3.4–10.8)

## 2016-05-27 LAB — FERRITIN: Ferritin: 115 ng/mL (ref 15–150)

## 2016-05-27 LAB — IRON AND TIBC
Iron Saturation: 22 % (ref 15–55)
Iron: 61 ug/dL (ref 27–139)
Total Iron Binding Capacity: 278 ug/dL (ref 250–450)
UIBC: 217 ug/dL (ref 118–369)

## 2016-06-15 ENCOUNTER — Encounter: Payer: Self-pay | Admitting: Family Medicine

## 2016-07-17 ENCOUNTER — Ambulatory Visit (INDEPENDENT_AMBULATORY_CARE_PROVIDER_SITE_OTHER): Payer: BLUE CROSS/BLUE SHIELD | Admitting: Family Medicine

## 2016-07-17 ENCOUNTER — Encounter: Payer: Self-pay | Admitting: Family Medicine

## 2016-07-17 VITALS — BP 132/82 | Ht 62.0 in | Wt 186.0 lb

## 2016-07-17 DIAGNOSIS — E119 Type 2 diabetes mellitus without complications: Secondary | ICD-10-CM

## 2016-07-17 DIAGNOSIS — J019 Acute sinusitis, unspecified: Secondary | ICD-10-CM

## 2016-07-17 DIAGNOSIS — B9689 Other specified bacterial agents as the cause of diseases classified elsewhere: Secondary | ICD-10-CM

## 2016-07-17 DIAGNOSIS — I10 Essential (primary) hypertension: Secondary | ICD-10-CM

## 2016-07-17 MED ORDER — AMOXICILLIN-POT CLAVULANATE 875-125 MG PO TABS: 1.0000 | ORAL_TABLET | Freq: Two times a day (BID) | ORAL | 0 refills | Status: DC

## 2016-07-17 MED ORDER — LINAGLIPTIN 5 MG PO TABS: 5.0000 mg | ORAL_TABLET | Freq: Every day | ORAL | 2 refills | Status: DC

## 2016-07-17 MED ORDER — AMLODIPINE BESYLATE 5 MG PO TABS: 5.0000 mg | ORAL_TABLET | Freq: Every day | ORAL | 2 refills | Status: DC

## 2016-07-17 NOTE — Progress Notes (Signed)
   Subjective:    Patient ID: Joanna [name], female    DOB: [birth year], 65 y.o.   MRN: [id number]  HPI Patient arrives with c/o sugars running higher than normal for a few weeks. Patient also reports her lips have not gotten better and she is having problems with a sinus infection. She states her sugars running higher because the orthopedist at a shot of prednisone as well as a taper of prednisone. She also relates her medicine and scaling ever since she started on Victoza she also relates that her sugar readings have been in the 200 range recently. Relates sinus pressure drainage coughing  Review of Systems Denies wheezing vomiting diarrhea high fever chills    Objective:   Physical Exam Mild sinus tenderness lips appear slightly dry and darker pigment lungs clear heart regular       Assessment & Plan:  Sinusitis antibiotics prescribed warning signs discussed  Diabetes stop Victoza because this could be causing side effect. I recommend Tradjenta 5 mg daily. She will send [place] some readings in a few weeks time  Comprehensive lab work in several weeks with a follow-up office visit in November

## 2016-07-21 ENCOUNTER — Telehealth: Payer: Self-pay

## 2016-07-21 DIAGNOSIS — Z79899 Other long term (current) drug therapy: Secondary | ICD-10-CM

## 2016-07-21 NOTE — Telephone Encounter (Signed)
Tradjenta was denied. Please see denial letter from insurance company in folder in [place] office.

## 2016-07-23 NOTE — Telephone Encounter (Signed)
Onglyza 2.5 mg 1 daily, #30, 3 refills, repeat metabolic 7 in approximately 2-3 weeks.(Lower dose was chosen because of mild renal impairment)

## 2016-07-24 MED ORDER — SAXAGLIPTIN HCL 2.5 MG PO TABS: 2.5000 mg | ORAL_TABLET | Freq: Every day | ORAL | 3 refills | Status: DC

## 2016-07-24 NOTE — Telephone Encounter (Signed)
Notified patient that Joanna [name] was denied and Onglyza was sent to pharmacy. Repeat Met 7 in 2-3 weeks. Patient verbalized understanding.

## 2016-09-01 LAB — BASIC METABOLIC PANEL
BUN/Creatinine Ratio: 13 (ref 12–28)
BUN: 17 mg/dL (ref 8–27)
CO2: 23 mmol/L (ref 18–29)
Calcium: 10.1 mg/dL (ref 8.7–10.3)
Chloride: 102 mmol/L (ref 96–106)
Creatinine, Ser: 1.3 mg/dL — ABNORMAL HIGH (ref 0.57–1.00)
GFR calc Af Amer: 50 mL/min/{1.73_m2} — ABNORMAL LOW (ref >59)
GFR calc non Af Amer: 43 mL/min/{1.73_m2} — ABNORMAL LOW (ref >59)
Glucose: 151 mg/dL — ABNORMAL HIGH (ref 65–99)
Potassium: 3.9 mmol/L (ref 3.5–5.2)
Sodium: 145 mmol/L — ABNORMAL HIGH (ref 134–144)

## 2016-09-05 ENCOUNTER — Encounter: Payer: Self-pay | Admitting: Family Medicine

## 2016-09-05 ENCOUNTER — Ambulatory Visit (INDEPENDENT_AMBULATORY_CARE_PROVIDER_SITE_OTHER): Payer: Medicare Other | Admitting: Family Medicine

## 2016-09-05 VITALS — Ht 62.0 in | Wt 188.0 lb

## 2016-09-05 DIAGNOSIS — I10 Essential (primary) hypertension: Secondary | ICD-10-CM | POA: Diagnosis not present

## 2016-09-05 DIAGNOSIS — E119 Type 2 diabetes mellitus without complications: Secondary | ICD-10-CM | POA: Diagnosis not present

## 2016-09-05 DIAGNOSIS — F5101 Primary insomnia: Secondary | ICD-10-CM | POA: Diagnosis not present

## 2016-09-05 DIAGNOSIS — E784 Other hyperlipidemia: Secondary | ICD-10-CM

## 2016-09-05 DIAGNOSIS — E7849 Other hyperlipidemia: Secondary | ICD-10-CM

## 2016-09-05 DIAGNOSIS — E039 Hypothyroidism, unspecified: Secondary | ICD-10-CM | POA: Diagnosis not present

## 2016-09-05 DIAGNOSIS — M1000 Idiopathic gout, unspecified site: Secondary | ICD-10-CM

## 2016-09-05 DIAGNOSIS — Z23 Encounter for immunization: Secondary | ICD-10-CM

## 2016-09-05 MED ORDER — LOSARTAN POTASSIUM 100 MG PO TABS: 100.0000 mg | ORAL_TABLET | Freq: Every day | ORAL | 1 refills | Status: DC

## 2016-09-05 MED ORDER — PRAVASTATIN SODIUM 20 MG PO TABS: 20.0000 mg | ORAL_TABLET | Freq: Every day | ORAL | 1 refills | Status: DC

## 2016-09-05 MED ORDER — LIRAGLUTIDE 18 MG/3ML ~~LOC~~ SOPN: PEN_INJECTOR | SUBCUTANEOUS | 0 refills | Status: DC

## 2016-09-05 MED ORDER — LEVOTHYROXINE SODIUM 88 MCG PO TABS: ORAL_TABLET | ORAL | 1 refills | Status: DC

## 2016-09-05 MED ORDER — ZOLPIDEM TARTRATE 5 MG PO TABS: 5.0000 mg | ORAL_TABLET | Freq: Every evening | ORAL | 5 refills | Status: DC | PRN

## 2016-09-05 MED ORDER — HYDROCODONE-ACETAMINOPHEN 10-325 MG PO TABS: 1.0000 | ORAL_TABLET | Freq: Four times a day (QID) | ORAL | 0 refills | Status: DC | PRN

## 2016-09-05 NOTE — Progress Notes (Signed)
   Subjective:    Patient ID: Joanna [name], female    DOB: 10/03/1951, 65 y.o.   MRN: [id number]  Diabetes  She presents for her follow-up diabetic visit. She has type 2 diabetes mellitus. Pertinent negatives for hypoglycemia include no confusion. Pertinent negatives for diabetes include no chest pain, no fatigue, no polydipsia, no polyphagia and no weakness. Risk factors for coronary artery disease include diabetes mellitus, hypertension and dyslipidemia. Current diabetic treatment includes oral agent (monotherapy). She is compliant with treatment all of the time. Her weight is stable. She is following a diabetic diet.   Greater than 25 minutes was spent with the patient discussing her diabetes options for diabetes importance again A1c down into the 6.5 range also discussing her compliance with thyroid cholesterol blood pressure medicines diet exercise. Patient denies any depression uses Xanax infrequently. Blood pressure under decent control continue current measures she is watching diet as best he can She states medication is helping her sleep. Without it she would not sleep well. She denies abusing it She does relate having to take pain medicine intermittently Review of Systems  Constitutional: Negative for activity change, appetite change and fatigue.  HENT: Negative for congestion.   Respiratory: Negative for cough.   Cardiovascular: Negative for chest pain.  Gastrointestinal: Negative for abdominal pain.  Endocrine: Negative for polydipsia and polyphagia.  Neurological: Negative for weakness.  Psychiatric/Behavioral: Negative for confusion.       Objective:   Physical Exam  Constitutional: She appears well-nourished. No distress.  Cardiovascular: Normal rate, regular rhythm and normal heart sounds.   No murmur heard. Pulmonary/Chest: Effort normal and breath sounds normal. No respiratory distress.  Musculoskeletal: She exhibits no edema.  Lymphadenopathy:    She has no cervical  adenopathy.  Neurological: She is alert. She exhibits normal muscle tone.  Psychiatric: Her behavior is normal.  Vitals reviewed. Diabetic foot exam completed normal        Assessment & Plan:  Diabetes-A1c was doing better on Victoza. After further discussion patient states she wasn't allergic Victoza. She thought it caused dry lips but she states she kept having it even after stopping Victoza. Therefore we will start Victoza again 1.2 mg daily. Stop Onglyza. Recheck A1c again in 3 months time watch diet stay active try to lose weight  Blood pressure decent control continue current measures watch diet minimize salt  Hyperlipidemia continue current measures watch diet previous labs reviewed check Before office visit  Insomnia Ambien nighttime patient is aware to give at least 8 hours of sleep to this caution drowsiness  History of gout but none recently check uric acid level  Thyroid issues takes medicine does well with it check lab work before next visit  Anxiety related issues uses Xanax sparingly is aware not to use it frequently  Chronic back pain and knee pain hydrocodone when necessary cautioned drowsiness she denies abusing the medication. Drug registry was checked.

## 2016-10-02 ENCOUNTER — Other Ambulatory Visit: Payer: Self-pay | Admitting: Family Medicine

## 2016-10-16 ENCOUNTER — Other Ambulatory Visit: Payer: Self-pay | Admitting: Family Medicine

## 2016-12-14 ENCOUNTER — Ambulatory Visit (INDEPENDENT_AMBULATORY_CARE_PROVIDER_SITE_OTHER): Payer: Medicare Other | Admitting: Family Medicine

## 2016-12-14 ENCOUNTER — Encounter: Payer: Self-pay | Admitting: Family Medicine

## 2016-12-14 VITALS — BP 118/86 | Ht 62.0 in

## 2016-12-14 DIAGNOSIS — E039 Hypothyroidism, unspecified: Secondary | ICD-10-CM

## 2016-12-14 DIAGNOSIS — E784 Other hyperlipidemia: Secondary | ICD-10-CM

## 2016-12-14 DIAGNOSIS — F320 Major depressive disorder, single episode, mild: Secondary | ICD-10-CM | POA: Diagnosis not present

## 2016-12-14 DIAGNOSIS — E611 Iron deficiency: Secondary | ICD-10-CM | POA: Diagnosis not present

## 2016-12-14 DIAGNOSIS — N289 Disorder of kidney and ureter, unspecified: Secondary | ICD-10-CM

## 2016-12-14 DIAGNOSIS — Z79899 Other long term (current) drug therapy: Secondary | ICD-10-CM

## 2016-12-14 DIAGNOSIS — E7849 Other hyperlipidemia: Secondary | ICD-10-CM

## 2016-12-14 DIAGNOSIS — E119 Type 2 diabetes mellitus without complications: Secondary | ICD-10-CM

## 2016-12-14 LAB — POCT GLYCOSYLATED HEMOGLOBIN (HGB A1C): Hemoglobin A1C: 6.3

## 2016-12-14 MED ORDER — CITALOPRAM HYDROBROMIDE 10 MG PO TABS: 10.0000 mg | ORAL_TABLET | Freq: Every day | ORAL | 2 refills | Status: DC

## 2016-12-14 NOTE — Progress Notes (Signed)
   Subjective:    Patient ID: Joanna [name], female    DOB: 1951/03/28, 66 y.o.   MRN: [id number]  Diabetes  She presents for her follow-up diabetic visit. She has type 2 diabetes mellitus. She is following a diabetic diet. Home blood sugar record trend: 150 's  She does not see a podiatrist.Eye exam is current.  A1C 6.3.  Headaches off and on for a few months.  Patient with frequent headaches off and on past few months feeling drained tired fatigue difficult time concentrating she admits to feeling down depressed blue partly because she is not working partly because a complexity of the healthcare system and also some personal stresses she denies being in suicidal  She does use Ambien at times to help her sleep She takes her cholesterol medicine on a regular basis watches her diet Takes her blood pressure medicine on a regular basis does not have any problem with it Does take pain medicine when necessary Uses Xanax sparingly she is wearing a cane cause drowsiness Takes a thyroid medicine regular basis Takes her acid reflux medicine on a regular basis does well Victoza has become expensive she is looking into a less expensive options  Review of Systems Patient denies any chest signs pressure pain shortness breath she does relate fatigue tiredness feeling rundown she has not done her labs in a while    Objective:   Physical Exam Lungs are clear hearts regular pulse normal BP good extremities no edema skin warm dry diabetic foot exam completed       Assessment & Plan:  Diabetes good control continue Victoza should patient find a less expensive measure she will let us know  History are deficiency check ferritin  Renal insufficiency continue blood pressure medicines Check kidney function  Thyroid continue current medicine check TSH  Hyperlipidemia watch diet previous labs reviewed check Labs  Depression add Celexa 10 mg daily patient not suicidal follow-up in 6 weeks'  time  Hypertension good control continue current measures  25 minutes was spent with the patient. Greater than half the time was spent in discussion and answering questions and counseling regarding the issues that the patient came in for today.

## 2017-01-02 ENCOUNTER — Other Ambulatory Visit: Payer: Self-pay | Admitting: Family Medicine

## 2017-01-02 DIAGNOSIS — Z1231 Encounter for screening mammogram for malignant neoplasm of breast: Secondary | ICD-10-CM

## 2017-01-18 ENCOUNTER — Ambulatory Visit (HOSPITAL_COMMUNITY)
Admission: RE | Admit: 2017-01-18 | Discharge: 2017-01-18 | Disposition: A | Discharge status: Home / Self Care | Payer: Medicare Other | Source: Ambulatory Visit | Attending: Family Medicine | Admitting: Family Medicine

## 2017-01-18 DIAGNOSIS — Z1231 Encounter for screening mammogram for malignant neoplasm of breast: Secondary | ICD-10-CM | POA: Insufficient documentation

## 2017-01-18 DIAGNOSIS — E784 Other hyperlipidemia: Secondary | ICD-10-CM | POA: Diagnosis not present

## 2017-01-18 DIAGNOSIS — Z79899 Other long term (current) drug therapy: Secondary | ICD-10-CM | POA: Diagnosis not present

## 2017-01-18 DIAGNOSIS — E611 Iron deficiency: Secondary | ICD-10-CM | POA: Diagnosis not present

## 2017-01-18 DIAGNOSIS — E039 Hypothyroidism, unspecified: Secondary | ICD-10-CM | POA: Diagnosis not present

## 2017-01-18 DIAGNOSIS — E119 Type 2 diabetes mellitus without complications: Secondary | ICD-10-CM | POA: Diagnosis not present

## 2017-01-19 LAB — TSH: TSH: 0.652 u[IU]/mL (ref 0.450–4.500)

## 2017-01-19 LAB — LIPID PANEL
Chol/HDL Ratio: 2.9 ratio (ref 0.0–4.4)
Cholesterol, Total: 168 mg/dL (ref 100–199)
HDL: 57 mg/dL (ref >39)
LDL Calculated: 87 mg/dL (ref 0–99)
Triglycerides: 118 mg/dL (ref 0–149)
VLDL Cholesterol Cal: 24 mg/dL (ref 5–40)

## 2017-01-19 LAB — HEPATIC FUNCTION PANEL
ALT: 19 IU/L (ref 0–32)
AST: 20 IU/L (ref 0–40)
Albumin: 4.2 g/dL (ref 3.6–4.8)
Alkaline Phosphatase: 87 IU/L (ref 39–117)
Bilirubin Total: 0.2 mg/dL (ref 0.0–1.2)
Bilirubin, Direct: 0.08 mg/dL (ref 0.00–0.40)
Total Protein: 6.7 g/dL (ref 6.0–8.5)

## 2017-01-19 LAB — MICROALBUMIN / CREATININE URINE RATIO
Creatinine, Urine: 72.2 mg/dL
Microalb/Creat Ratio: 524.8 mg/g creat — ABNORMAL HIGH (ref 0.0–30.0)
Microalbumin, Urine: 378.9 ug/mL

## 2017-01-19 LAB — FERRITIN: Ferritin: 58 ng/mL (ref 15–150)

## 2017-01-25 ENCOUNTER — Other Ambulatory Visit: Payer: Self-pay | Admitting: *Deleted

## 2017-01-25 ENCOUNTER — Encounter: Payer: Self-pay | Admitting: Family Medicine

## 2017-01-25 ENCOUNTER — Ambulatory Visit (INDEPENDENT_AMBULATORY_CARE_PROVIDER_SITE_OTHER): Payer: Medicare Other | Admitting: Family Medicine

## 2017-01-25 VITALS — BP 136/84 | Ht 62.0 in | Wt 184.4 lb

## 2017-01-25 DIAGNOSIS — E119 Type 2 diabetes mellitus without complications: Secondary | ICD-10-CM | POA: Diagnosis not present

## 2017-01-25 MED ORDER — PRAVASTATIN SODIUM 40 MG PO TABS: 40.0000 mg | ORAL_TABLET | Freq: Every day | ORAL | 1 refills | Status: DC

## 2017-01-25 NOTE — Progress Notes (Signed)
   Subjective:    Patient ID: Joanna [name], female    DOB: 10-13-1951, 66 y.o.   MRN: [id number]  Depression         This is a recurrent problem.  The current episode started more than 1 month ago.  States her diabetes doing okay lab work was reviewed with patient in detail  Patient states has not been able to take Celexa prescribed due to insurance. Patient not suicidal Has concerns of skin tag to inner thigh.  Review of Systems  Psychiatric/Behavioral: Positive for depression.  She is been through a lot of stressful things dealing with some mild depression but she plans on getting her Celexa filled in taking it she will follow-up in June sooner if any problems Denies any chest tightness pressure pain shortness of breath    Objective:   Physical Exam Lungs clear heart regular skin tag noted on the left leg       Assessment & Plan:  She will get skin tag removed 1 she is stable with her insurance  Unable to afford Victoza currently but she has a Medicare plan kicking in early May to get the prescription  Cholesterol patient will need to be on a higher dose of cholesterol medicine she'll start this once her new insurance kicks in in May

## 2017-02-13 ENCOUNTER — Ambulatory Visit (INDEPENDENT_AMBULATORY_CARE_PROVIDER_SITE_OTHER): Payer: Medicare Other | Admitting: Family Medicine

## 2017-02-13 ENCOUNTER — Encounter: Payer: Self-pay | Admitting: Family Medicine

## 2017-02-13 VITALS — BP 134/86 | Temp 98.5°F | Ht 62.0 in | Wt 184.0 lb

## 2017-02-13 DIAGNOSIS — M1000 Idiopathic gout, unspecified site: Secondary | ICD-10-CM

## 2017-02-13 DIAGNOSIS — J019 Acute sinusitis, unspecified: Secondary | ICD-10-CM | POA: Diagnosis not present

## 2017-02-13 DIAGNOSIS — F5101 Primary insomnia: Secondary | ICD-10-CM

## 2017-02-13 DIAGNOSIS — I10 Essential (primary) hypertension: Secondary | ICD-10-CM

## 2017-02-13 DIAGNOSIS — E7849 Other hyperlipidemia: Secondary | ICD-10-CM

## 2017-02-13 DIAGNOSIS — M5416 Radiculopathy, lumbar region: Secondary | ICD-10-CM | POA: Diagnosis not present

## 2017-02-13 DIAGNOSIS — E119 Type 2 diabetes mellitus without complications: Secondary | ICD-10-CM

## 2017-02-13 DIAGNOSIS — F419 Anxiety disorder, unspecified: Secondary | ICD-10-CM

## 2017-02-13 DIAGNOSIS — E784 Other hyperlipidemia: Secondary | ICD-10-CM

## 2017-02-13 DIAGNOSIS — G8929 Other chronic pain: Secondary | ICD-10-CM

## 2017-02-13 MED ORDER — OMEPRAZOLE 40 MG PO CPDR: DELAYED_RELEASE_CAPSULE | ORAL | 1 refills | Status: DC

## 2017-02-13 MED ORDER — HYDROCODONE-ACETAMINOPHEN 10-325 MG PO TABS: 1.0000 | ORAL_TABLET | Freq: Four times a day (QID) | ORAL | 0 refills | Status: DC | PRN

## 2017-02-13 MED ORDER — ALPRAZOLAM 0.5 MG PO TABS: ORAL_TABLET | ORAL | 5 refills | Status: DC

## 2017-02-13 MED ORDER — COLCHICINE 0.6 MG PO TABS: ORAL_TABLET | ORAL | 5 refills | Status: DC

## 2017-02-13 MED ORDER — AMOXICILLIN 500 MG PO TABS: 500.0000 mg | ORAL_TABLET | Freq: Three times a day (TID) | ORAL | 0 refills | Status: DC

## 2017-02-13 MED ORDER — LIRAGLUTIDE 18 MG/3ML ~~LOC~~ SOPN: PEN_INJECTOR | SUBCUTANEOUS | 5 refills | Status: DC

## 2017-02-13 MED ORDER — CITALOPRAM HYDROBROMIDE 10 MG PO TABS: 10.0000 mg | ORAL_TABLET | Freq: Every day | ORAL | 1 refills | Status: DC

## 2017-02-13 MED ORDER — ZOLPIDEM TARTRATE 5 MG PO TABS: 5.0000 mg | ORAL_TABLET | Freq: Every evening | ORAL | 5 refills | Status: DC | PRN

## 2017-02-13 MED ORDER — LEVOTHYROXINE SODIUM 88 MCG PO TABS: ORAL_TABLET | ORAL | 1 refills | Status: DC

## 2017-02-13 MED ORDER — PRAVASTATIN SODIUM 40 MG PO TABS: 40.0000 mg | ORAL_TABLET | Freq: Every day | ORAL | 1 refills | Status: DC

## 2017-02-13 MED ORDER — LOSARTAN POTASSIUM 100 MG PO TABS: 100.0000 mg | ORAL_TABLET | Freq: Every day | ORAL | 1 refills | Status: DC

## 2017-02-13 MED ORDER — INDAPAMIDE 1.25 MG PO TABS
1.2500 mg | ORAL_TABLET | Freq: Every morning | ORAL | 1 refills | Status: DC
Start: 2017-02-13 — End: 2017-02-23

## 2017-02-13 NOTE — Progress Notes (Signed)
   Subjective:    Patient ID: Joanna [name], female    DOB: [birth year], 66 y.o.   MRN: [id number]  Sinusitis  This is a new problem. Associated symptoms include coughing, ear pain and headaches. (Fever, wheezing)   Wants all prescriptions refilled. Had med check up on April 12. Patient with sinus pressure pain discomfort denies high fever chills sweats relates moderate allergies no wheezing or difficulty breathing  Review of Systems  HENT: Positive for ear pain.   Respiratory: Positive for cough.   Neurological: Positive for headaches.       Objective:   Physical Exam Mild sinus tenderness eardrums normal neck no masses lungs clear heart regular       Assessment & Plan:  Viral syndrome with possible allergies Sinusitis along with antibiotics prescribed warning signs discussed  Medication refills were given to the patient she had a medication recheck earlier this month  Patient was counseled to use pain medicine sparingly caution drowsiness  Patient was cautioned not to use nerve medication and pain medicine together on the same day. And not to take nerve medicine with her sleeping pill. She was cautioned regarding drowsiness. She was also cautioned regarding the potential for pain medicine the nerve medicine interaction with potential for accidental overdose

## 2017-02-23 ENCOUNTER — Other Ambulatory Visit: Payer: Self-pay

## 2017-02-23 ENCOUNTER — Telehealth: Payer: Self-pay | Admitting: Family Medicine

## 2017-02-23 MED ORDER — PRAVASTATIN SODIUM 40 MG PO TABS
40.0000 mg | ORAL_TABLET | Freq: Every day | ORAL | 0 refills | Status: DC
Start: 2017-02-23 — End: 2017-08-21

## 2017-02-23 MED ORDER — CITALOPRAM HYDROBROMIDE 10 MG PO TABS: 10.0000 mg | ORAL_TABLET | Freq: Every day | ORAL | 0 refills | Status: DC

## 2017-02-23 MED ORDER — INDAPAMIDE 1.25 MG PO TABS: 1.2500 mg | ORAL_TABLET | Freq: Every morning | ORAL | 0 refills | Status: DC

## 2017-02-23 MED ORDER — LEVOTHYROXINE SODIUM 88 MCG PO TABS: ORAL_TABLET | ORAL | 0 refills | Status: DC

## 2017-02-23 MED ORDER — LOSARTAN POTASSIUM 100 MG PO TABS: 100.0000 mg | ORAL_TABLET | Freq: Every day | ORAL | 0 refills | Status: DC

## 2017-02-23 MED ORDER — OMEPRAZOLE 40 MG PO CPDR: DELAYED_RELEASE_CAPSULE | ORAL | 0 refills | Status: DC

## 2017-02-23 MED ORDER — PRAVASTATIN SODIUM 40 MG PO TABS: 40.0000 mg | ORAL_TABLET | Freq: Every day | ORAL | 0 refills | Status: DC

## 2017-02-23 NOTE — Telephone Encounter (Signed)
Patient was seen 5/1 and got refills on medications for mail order but she is out of most of her prescription and needing them now and requesting refills on celexa 10 mg, lozol 1.25 mg, pravastatin 40 mg, synthroid 88 mcg, cozaar 100 mg, prilosec 40 mg called into her new pharmacy [facility]

## 2017-02-23 NOTE — Telephone Encounter (Signed)
Spoke with patient and informed her that refills were sent into local pharmacy. Patient verbalized understanding.

## 2017-02-26 ENCOUNTER — Telehealth: Payer: Self-pay | Admitting: Family Medicine

## 2017-02-26 NOTE — Telephone Encounter (Signed)
Error wasn't closed by nurse

## 2017-02-26 NOTE — Telephone Encounter (Addendum)
Nurse didn't close message when she finished.please close message clinical dept.

## 2017-04-03 DIAGNOSIS — H40053 Ocular hypertension, bilateral: Secondary | ICD-10-CM | POA: Diagnosis not present

## 2017-04-03 DIAGNOSIS — H524 Presbyopia: Secondary | ICD-10-CM | POA: Diagnosis not present

## 2017-04-24 ENCOUNTER — Ambulatory Visit (INDEPENDENT_AMBULATORY_CARE_PROVIDER_SITE_OTHER): Payer: Medicare Other | Admitting: Family Medicine

## 2017-04-24 ENCOUNTER — Encounter: Payer: Self-pay | Admitting: Family Medicine

## 2017-04-24 VITALS — BP 122/78 | Ht 62.0 in | Wt 175.6 lb

## 2017-04-24 DIAGNOSIS — I10 Essential (primary) hypertension: Secondary | ICD-10-CM | POA: Diagnosis not present

## 2017-04-24 DIAGNOSIS — E784 Other hyperlipidemia: Secondary | ICD-10-CM | POA: Diagnosis not present

## 2017-04-24 DIAGNOSIS — G4709 Other insomnia: Secondary | ICD-10-CM

## 2017-04-24 DIAGNOSIS — E119 Type 2 diabetes mellitus without complications: Secondary | ICD-10-CM | POA: Diagnosis not present

## 2017-04-24 DIAGNOSIS — F321 Major depressive disorder, single episode, moderate: Secondary | ICD-10-CM | POA: Diagnosis not present

## 2017-04-24 DIAGNOSIS — E039 Hypothyroidism, unspecified: Secondary | ICD-10-CM | POA: Diagnosis not present

## 2017-04-24 DIAGNOSIS — N289 Disorder of kidney and ureter, unspecified: Secondary | ICD-10-CM | POA: Diagnosis not present

## 2017-04-24 DIAGNOSIS — E7849 Other hyperlipidemia: Secondary | ICD-10-CM

## 2017-04-24 LAB — POCT GLYCOSYLATED HEMOGLOBIN (HGB A1C): Hemoglobin A1C: 6.3

## 2017-04-24 MED ORDER — SERTRALINE HCL 50 MG PO TABS: 50.0000 mg | ORAL_TABLET | Freq: Every day | ORAL | 3 refills | Status: DC

## 2017-04-24 MED ORDER — TRIAMCINOLONE ACETONIDE 0.1 % EX CREA: 1.0000 "application " | TOPICAL_CREAM | Freq: Two times a day (BID) | CUTANEOUS | 4 refills | Status: DC

## 2017-04-24 MED ORDER — INDAPAMIDE 2.5 MG PO TABS: 2.5000 mg | ORAL_TABLET | Freq: Every morning | ORAL | 5 refills | Status: DC

## 2017-04-24 NOTE — Progress Notes (Addendum)
   Subjective:    Patient ID: Joanna [name], female    DOB: 01/05/1951, 66 y.o.   MRN: [id number]  Diabetes  She presents for her follow-up diabetic visit. She has type 2 diabetes mellitus. Hypoglycemia symptoms include headaches. Risk factors for coronary artery disease include diabetes mellitus, post-menopausal, hypertension and dyslipidemia. Current diabetic treatment includes insulin injections. She is compliant with treatment all of the time. Her weight is stable. She is following a diabetic diet. She has not had a previous visit with a dietitian. She does not see a podiatrist.Eye exam is current.  Headache   This is a chronic problem. The current episode started more than 1 month ago. The problem occurs constantly. The pain is located in the right unilateral region. The pain does not radiate. The pain quality is not similar to prior headaches. The quality of the pain is described as throbbing. The pain is at a severity of 8/10. The pain is severe. Associated symptoms include nausea and vomiting. Pertinent negatives include no fever. Nothing aggravates the symptoms. She has tried acetaminophen for the symptoms. The treatment provided mild relief.   Patient with c/o rash on right arm -Papular rash on right arm around the wrist itches in intermittently   constant headaches-See above Patient very depressed recently feels down lost several family members also has another family member having open heart surgery at times more than she can take she does pray about things and has friends that talk to her she denies being suicidal  Review of Systems  Constitutional: Negative for fever.  Gastrointestinal: Positive for nausea and vomiting.  Neurological: Positive for headaches.       Objective:   Physical Exam Lungs clear hearts regular blood pressure elevated extremities no edema skin warm dry  The patient is not on insulin but is a type II diabetic. It is recommended for her to check her glucose  readings on a once daily basis no need to check more often.    patient not suicidal Assessment & Plan:  Diabetes decent control, watch diet stay physically active  HTN renal insufficiency adjust medications to get better improvement of blood pressure recheck metabolic 7 in approximate 3-4 weeks  Hypothyroidism previous labs reviewed continue current measures  Insomnia have chosen not to prescribe any medications other than occasional Xanax cautioned drowsiness  Hyperlipidemia previous labs reviewed continue current measures  Chronic back pain hydrocodone when necessary does not need any prescriptions currently  Under a tremendous amount of stress her grandson is having open heart surgery sometime later this fall in [place] she is also had several family members pass away recently dealing with some mild depression we will switch to Zoloft we will recheck her again in one month's time

## 2017-05-21 ENCOUNTER — Ambulatory Visit (INDEPENDENT_AMBULATORY_CARE_PROVIDER_SITE_OTHER): Payer: Medicare Other | Admitting: Family Medicine

## 2017-05-21 ENCOUNTER — Encounter: Payer: Self-pay | Admitting: Family Medicine

## 2017-05-21 VITALS — BP 122/78 | Ht 62.0 in | Wt 174.6 lb

## 2017-05-21 DIAGNOSIS — I10 Essential (primary) hypertension: Secondary | ICD-10-CM | POA: Diagnosis not present

## 2017-05-21 DIAGNOSIS — E119 Type 2 diabetes mellitus without complications: Secondary | ICD-10-CM | POA: Diagnosis not present

## 2017-05-21 DIAGNOSIS — N289 Disorder of kidney and ureter, unspecified: Secondary | ICD-10-CM | POA: Diagnosis not present

## 2017-05-21 NOTE — Progress Notes (Signed)
   Subjective:    Patient ID: Joanna [name], female    DOB: 1951-03-09, 66 y.o.   MRN: [id number]  HPI States she's been taking blood pressure medicine doing better with this denies any problems States she had side effects with Zoloft she does not want to take it She denies the depression occurring currently Patient arrives for a follow up on blood pressure. Review of Systems Denies chest tightness pressure pain shortness breath vomiting diarrhea fever chills    Objective:   Physical Exam  Lungs clear heart regular pulse normal extremities no edema blood pressure good      Assessment & Plan:  Blood pressure is doing surprisingly well on current medication but she has not been taking amlodipine for the past 6 weeks therefore I told her to hold off on amlodipine we are taking it off of her medicine list  She states her moods overall are doing well to Zoloft caused her problem she does not feel she needs an antidepressant situations are going better she will probably go see her grandson after his heart surgery  She will keep her regular follow-up visit with [place]

## 2017-05-22 LAB — BASIC METABOLIC PANEL
BUN/Creatinine Ratio: 12 (ref 12–28)
BUN: 18 mg/dL (ref 8–27)
CO2: 26 mmol/L (ref 20–29)
Calcium: 10.3 mg/dL (ref 8.7–10.3)
Chloride: 103 mmol/L (ref 96–106)
Creatinine, Ser: 1.54 mg/dL — ABNORMAL HIGH (ref 0.57–1.00)
GFR calc Af Amer: 41 mL/min/{1.73_m2} — ABNORMAL LOW (ref >59)
GFR calc non Af Amer: 35 mL/min/{1.73_m2} — ABNORMAL LOW (ref >59)
Glucose: 93 mg/dL (ref 65–99)
Potassium: 3.9 mmol/L (ref 3.5–5.2)
Sodium: 142 mmol/L (ref 134–144)

## 2017-05-23 NOTE — Addendum Note (Signed)
Addended by: Carmelina Noun on: 05/23/2017 12:06 PM   Modules accepted: Orders

## 2017-06-19 ENCOUNTER — Telehealth: Payer: Self-pay | Admitting: *Deleted

## 2017-06-19 NOTE — Telephone Encounter (Signed)
I believe it would be reasonable for this patient to do a follow-up office visit later this week to recheck her blood pressure. Patient should bring her medications with her when she comes

## 2017-06-19 NOTE — Telephone Encounter (Signed)
Discussed with pt. Pt transferred to front to schedule office visit this week for bp check up.

## 2017-06-19 NOTE — Telephone Encounter (Signed)
Pt called she was at dentist today and they would not pull her tooth because her bp was 146/114. Seen here on 8/6 for bp check up. Takes losartan 100mg  qd and indapamide 2.5mg  qd. Pt states she takes everyday. Has been having some headaches. One today also. Feels ok today.   [phone]

## 2017-06-21 ENCOUNTER — Ambulatory Visit (INDEPENDENT_AMBULATORY_CARE_PROVIDER_SITE_OTHER): Payer: Medicare Other | Admitting: Family Medicine

## 2017-06-21 ENCOUNTER — Encounter: Payer: Self-pay | Admitting: Family Medicine

## 2017-06-21 VITALS — BP 130/86 | Ht 62.0 in | Wt 172.6 lb

## 2017-06-21 DIAGNOSIS — Z23 Encounter for immunization: Secondary | ICD-10-CM | POA: Diagnosis not present

## 2017-06-21 DIAGNOSIS — F5101 Primary insomnia: Secondary | ICD-10-CM

## 2017-06-21 DIAGNOSIS — G4733 Obstructive sleep apnea (adult) (pediatric): Secondary | ICD-10-CM

## 2017-06-21 DIAGNOSIS — E7849 Other hyperlipidemia: Secondary | ICD-10-CM

## 2017-06-21 DIAGNOSIS — M1000 Idiopathic gout, unspecified site: Secondary | ICD-10-CM

## 2017-06-21 DIAGNOSIS — I10 Essential (primary) hypertension: Secondary | ICD-10-CM

## 2017-06-21 DIAGNOSIS — E119 Type 2 diabetes mellitus without complications: Secondary | ICD-10-CM

## 2017-06-21 MED ORDER — AMLODIPINE BESYLATE 2.5 MG PO TABS: 2.5000 mg | ORAL_TABLET | Freq: Every day | ORAL | 1 refills | Status: DC

## 2017-06-21 NOTE — Progress Notes (Signed)
   Subjective:    Patient ID: Joanna [name], female    DOB: Jan 09, 1951, 66 y.o.   MRN: [id number]  HPI  Patient arrives with c/o elevated blood pressure at dentist tis week- patient states she has fear of dentist. Patient states that she was somewhat stressed out at the dentist office and because of this her blood pressure went way up and they refused to pull her tooth should they have referred her to a oral surgeon  She does state she had atypical discomfort in her chest that last less than a minute occurred 2 separate times at rest she denies any chest pressure tightness or pain with walking or with activity. Review of Systems  Constitutional: Negative for activity change, fatigue and fever.  HENT: Negative for congestion.   Respiratory: Negative for cough, chest tightness and shortness of breath.   Cardiovascular: Negative for chest pain and leg swelling.  Gastrointestinal: Negative for abdominal pain.  Skin: Negative for color change.  Neurological: Negative for headaches.  Psychiatric/Behavioral: Negative for behavioral problems.       Objective:   Physical Exam  Constitutional: She appears well-developed and well-nourished. No distress.  HENT:  Head: Normocephalic and atraumatic.  Eyes: Right eye exhibits no discharge. Left eye exhibits no discharge.  Neck: No tracheal deviation present.  Cardiovascular: Normal rate, regular rhythm and normal heart sounds.   No murmur heard. Pulmonary/Chest: Effort normal and breath sounds normal. No respiratory distress. She has no wheezes. She has no rales.  Musculoskeletal: She exhibits no edema.  Lymphadenopathy:    She has no cervical adenopathy.  Neurological: She is alert. She exhibits normal muscle tone.  Skin: Skin is warm and dry. No erythema.  Psychiatric: Her behavior is normal.  Vitals reviewed.         Assessment & Plan:  High blood pressure-not as controlled as what I would like to see-add amlodipine 2.5 mg  daily-follow-up if progressive symptoms-follow-up later this year as previously scheduled  She will let us know which oral surgeon she will be seeing so we can send him a note regarding her blood pressure-she does have a tendency for blood pressure to rise when she is at the dental office for procedures  I think the likelihood of this being heart disease is low. But I will be bringing the patient back to have a EKG completed to be on the safe side

## 2017-06-22 NOTE — Progress Notes (Signed)
Spoke with patient and informed her that Dr.Scott would like to do a nurse visit for an EKG. Patient verbalized understanding and was transferred the front desk to schedule nurse visit for EKG.

## 2017-06-26 ENCOUNTER — Other Ambulatory Visit (INDEPENDENT_AMBULATORY_CARE_PROVIDER_SITE_OTHER): Payer: Medicare Other

## 2017-06-26 DIAGNOSIS — R079 Chest pain, unspecified: Secondary | ICD-10-CM

## 2017-06-26 MED ORDER — INDAPAMIDE 2.5 MG PO TABS: 2.5000 mg | ORAL_TABLET | Freq: Every morning | ORAL | 2 refills | Status: DC

## 2017-06-28 ENCOUNTER — Telehealth: Payer: Self-pay | Admitting: Family Medicine

## 2017-06-28 NOTE — Telephone Encounter (Signed)
EKG was completed on 06/26/2017 at my request patient when she was last seen in the office several days ago states she had atypical chest pain. This was not with activity did not include heaviness or tightness or shortness of breath. No angina pain. Her EKG has a normal appearance normal QRS no ST segment changes. Patient states she is no longer having chest discomfort of any sort she will report to [place] if she has any further.

## 2017-06-29 ENCOUNTER — Encounter: Payer: Self-pay | Admitting: Family Medicine

## 2017-07-02 ENCOUNTER — Encounter: Payer: Self-pay | Admitting: Family Medicine

## 2017-07-23 ENCOUNTER — Telehealth: Payer: Self-pay | Admitting: *Deleted

## 2017-07-23 NOTE — Telephone Encounter (Signed)
[facility] calling to get rx for cpap and supplies. Fax to [phone].

## 2017-07-23 NOTE — Telephone Encounter (Signed)
FYI - Spoke with patient, previous machine is broken & it's been about 10 years since sleep study Explained to pt that she'll need an OV to discuss, a new sleep study then we can order a new machine Pt wants to include this in her November OV, noted on appt notes Pt states she prefers to use a local DME company once machine is ordered Faxed notice to [place] that pt's preference

## 2017-07-30 ENCOUNTER — Encounter: Payer: Self-pay | Admitting: Family Medicine

## 2017-07-30 ENCOUNTER — Ambulatory Visit (INDEPENDENT_AMBULATORY_CARE_PROVIDER_SITE_OTHER): Payer: Medicare Other | Admitting: Family Medicine

## 2017-07-30 VITALS — BP 128/80 | Temp 98.2°F | Wt 168.1 lb

## 2017-07-30 DIAGNOSIS — J019 Acute sinusitis, unspecified: Secondary | ICD-10-CM | POA: Diagnosis not present

## 2017-07-30 DIAGNOSIS — J4520 Mild intermittent asthma, uncomplicated: Secondary | ICD-10-CM

## 2017-07-30 MED ORDER — ALBUTEROL SULFATE HFA 108 (90 BASE) MCG/ACT IN AERS: 2.0000 | INHALATION_SPRAY | Freq: Four times a day (QID) | RESPIRATORY_TRACT | 2 refills | Status: DC | PRN

## 2017-07-30 MED ORDER — AMOXICILLIN-POT CLAVULANATE 875-125 MG PO TABS: 1.0000 | ORAL_TABLET | Freq: Two times a day (BID) | ORAL | 0 refills | Status: AC

## 2017-07-30 NOTE — Progress Notes (Signed)
   Subjective:    Patient ID: Joanna [name], female    DOB: 1950-11-11, 66 y.o.   MRN: [id number]  Cough  This is a new problem. The current episode started in the past 7 days. Associated symptoms include headaches, rhinorrhea, a sore throat and wheezing. Associated symptoms comments: cough. Treatments tried: Tylenol Cold sinus.   Sudden onset last week  Cough cong and headache  Cough productive  Now wheezing   Dim enrergy   Patient states no other concerns this visit.   Review of Systems  HENT: Positive for rhinorrhea and sore throat.   Respiratory: Positive for cough and wheezing.   Neurological: Positive for headaches.       Objective:   Physical Exam Alert, mild malaise. Hydration good Vitals stable. frontal/ maxillary tenderness evident positive nasal congestion. pharynx normal neck supple  lungs clear/no crackles , positive rare wheezes. heart regular in rhythm        Assessment & Plan:  Impression rhinosinusitis/bronchitis elements of reactive airways advise albuterol when necessary likely post viral, discussed with patient. plan antibiotics prescribed. Questions answered. Symptomatic care discussed. warning signs discussed. WSL

## 2017-08-13 ENCOUNTER — Ambulatory Visit (HOSPITAL_COMMUNITY)
Admission: RE | Admit: 2017-08-13 | Discharge: 2017-08-13 | Disposition: A | Discharge status: Home / Self Care | Payer: Medicare Other | Source: Ambulatory Visit | Attending: Family Medicine | Admitting: Family Medicine

## 2017-08-13 ENCOUNTER — Other Ambulatory Visit: Payer: Self-pay | Admitting: *Deleted

## 2017-08-13 ENCOUNTER — Telehealth: Payer: Self-pay | Admitting: Family Medicine

## 2017-08-13 DIAGNOSIS — R05 Cough: Secondary | ICD-10-CM

## 2017-08-13 DIAGNOSIS — R0602 Shortness of breath: Secondary | ICD-10-CM | POA: Diagnosis not present

## 2017-08-13 DIAGNOSIS — R059 Cough, unspecified: Secondary | ICD-10-CM

## 2017-08-13 MED ORDER — DOXYCYCLINE HYCLATE 100 MG PO TABS: 100.0000 mg | ORAL_TABLET | Freq: Two times a day (BID) | ORAL | 0 refills | Status: DC

## 2017-08-13 NOTE — Telephone Encounter (Signed)
Recommend doxycycline 1 twice daily for 10 days take with a snack in the tall glass of liquids not with milk, also recommend a chest x-ray to be done either this evening or in the morning.  Also recommend the patient to do a follow-up office visit tomorrow afternoon

## 2017-08-13 NOTE — Telephone Encounter (Signed)
Patient seen Dr. Richardson Landry on 07/30/17 for rhinosinusitis.  She is still having cough, sneezing, congestion with green phlegm.  Please advise.  [name]

## 2017-08-13 NOTE — Telephone Encounter (Signed)
Discussed with pt. Pt verbalized understanding. Med sent to pharm. cxr order put in. Pt states she will do it tonight. Transferred to front to schedule office visit tomorrow.

## 2017-08-13 NOTE — Telephone Encounter (Signed)
Still wheezing, using inhaler, helps some. Low grade fever yesterday. None today. finsihed antbiotic last Friday. Got a little better on antibiotic. Started 4 weeks ago.

## 2017-08-14 ENCOUNTER — Ambulatory Visit (INDEPENDENT_AMBULATORY_CARE_PROVIDER_SITE_OTHER): Payer: Medicare Other | Admitting: Family Medicine

## 2017-08-14 ENCOUNTER — Encounter: Payer: Self-pay | Admitting: Family Medicine

## 2017-08-14 VITALS — BP 132/82 | Temp 98.7°F | Ht 62.0 in | Wt 172.4 lb

## 2017-08-14 DIAGNOSIS — R509 Fever, unspecified: Secondary | ICD-10-CM

## 2017-08-14 DIAGNOSIS — J019 Acute sinusitis, unspecified: Secondary | ICD-10-CM

## 2017-08-14 DIAGNOSIS — J209 Acute bronchitis, unspecified: Secondary | ICD-10-CM

## 2017-08-14 MED ORDER — CEFTRIAXONE SODIUM 1 G IJ SOLR
500.0000 mg | Freq: Once | INTRAMUSCULAR | Status: AC
  Administered 2017-08-14: 500 mg via INTRAMUSCULAR

## 2017-08-14 NOTE — Progress Notes (Signed)
   Subjective:    Patient ID: Joanna [name], female    DOB: 03/15/1951, 66 y.o.   MRN: [id number]  HPI  Patient arrives for a recheck on wheezing.  Patient relates it started 5-6 weeks ago head congestion drainage and coughing then started having sinus pressure pain discomfort with drainage along with chest congestion coughing wheezing some shortness of breath some low-grade fevers no vomiting or diarrhea.  Energy level subpar appetite subpar had tried antibiotics that she was prescribed did not really seem to help a whole lot.  Using albuterol as needed.  Recently called back regarding this and we directed her to do a chest x-ray.  Chest x-ray look good no pneumonia.  PMH benign.  Review of Systems  Constitutional: Positive for chills and fatigue. Negative for activity change and fever.  HENT: Positive for congestion and rhinorrhea. Negative for ear pain.   Eyes: Negative for discharge.  Respiratory: Positive for cough and wheezing. Negative for shortness of breath.   Cardiovascular: Negative for chest pain.       Objective:   Physical Exam  Constitutional: She appears well-developed.  HENT:  Head: Normocephalic.  Right Ear: External ear normal.  Left Ear: External ear normal.  Nose: Nose normal.  Mouth/Throat: Oropharynx is clear and moist. No oropharyngeal exudate.  Eyes: Right eye exhibits no discharge. Left eye exhibits no discharge.  Neck: Neck supple. No tracheal deviation present.  Cardiovascular: Normal rate and normal heart sounds.   No murmur heard. Pulmonary/Chest: Effort normal and breath sounds normal. She has no wheezes. She has no rales.  Lymphadenopathy:    She has no cervical adenopathy.  Skin: Skin is warm and dry.  Nursing note and vitals reviewed.         Assessment & Plan:  Viral syndrome Acute rhinosinusitis Febrile illness Acute bronchitis Reactive airway Albuterol as needed X-ray negative for pneumonia Because of persistent illness additional  antibiotics doxycycline 10 days as well as Rocephin 500 mg IM if progressive troubles or if worse follow-up

## 2017-08-17 DIAGNOSIS — E119 Type 2 diabetes mellitus without complications: Secondary | ICD-10-CM | POA: Diagnosis not present

## 2017-08-17 DIAGNOSIS — I10 Essential (primary) hypertension: Secondary | ICD-10-CM | POA: Diagnosis not present

## 2017-08-18 LAB — BASIC METABOLIC PANEL
BUN/Creatinine Ratio: 13 (ref 12–28)
BUN: 20 mg/dL (ref 8–27)
CO2: 27 mmol/L (ref 20–29)
Calcium: 10.1 mg/dL (ref 8.7–10.3)
Chloride: 102 mmol/L (ref 96–106)
Creatinine, Ser: 1.51 mg/dL — ABNORMAL HIGH (ref 0.57–1.00)
GFR calc Af Amer: 42 mL/min/{1.73_m2} — ABNORMAL LOW (ref >59)
GFR calc non Af Amer: 36 mL/min/{1.73_m2} — ABNORMAL LOW (ref >59)
Glucose: 97 mg/dL (ref 65–99)
Potassium: 3.4 mmol/L — ABNORMAL LOW (ref 3.5–5.2)
Sodium: 143 mmol/L (ref 134–144)

## 2017-08-18 LAB — HEMOGLOBIN A1C
Est. average glucose Bld gHb Est-mCnc: 143 mg/dL
Hgb A1c MFr Bld: 6.6 % — ABNORMAL HIGH (ref 4.8–5.6)

## 2017-08-18 LAB — MICROALBUMIN / CREATININE URINE RATIO
Creatinine, Urine: 215 mg/dL
Microalb/Creat Ratio: 390 mg/g creat — ABNORMAL HIGH (ref 0.0–30.0)
Microalbumin, Urine: 838.6 ug/mL

## 2017-08-21 ENCOUNTER — Ambulatory Visit (INDEPENDENT_AMBULATORY_CARE_PROVIDER_SITE_OTHER): Payer: Medicare Other | Admitting: Family Medicine

## 2017-08-21 ENCOUNTER — Encounter: Payer: Self-pay | Admitting: Family Medicine

## 2017-08-21 VITALS — BP 130/90 | Ht 62.0 in | Wt 173.0 lb

## 2017-08-21 DIAGNOSIS — I10 Essential (primary) hypertension: Secondary | ICD-10-CM

## 2017-08-21 DIAGNOSIS — E119 Type 2 diabetes mellitus without complications: Secondary | ICD-10-CM | POA: Diagnosis not present

## 2017-08-21 DIAGNOSIS — G4709 Other insomnia: Secondary | ICD-10-CM

## 2017-08-21 DIAGNOSIS — N289 Disorder of kidney and ureter, unspecified: Secondary | ICD-10-CM | POA: Diagnosis not present

## 2017-08-21 DIAGNOSIS — E039 Hypothyroidism, unspecified: Secondary | ICD-10-CM | POA: Diagnosis not present

## 2017-08-21 DIAGNOSIS — M5416 Radiculopathy, lumbar region: Secondary | ICD-10-CM | POA: Diagnosis not present

## 2017-08-21 DIAGNOSIS — G8929 Other chronic pain: Secondary | ICD-10-CM | POA: Diagnosis not present

## 2017-08-21 DIAGNOSIS — Z23 Encounter for immunization: Secondary | ICD-10-CM | POA: Diagnosis not present

## 2017-08-21 DIAGNOSIS — E7849 Other hyperlipidemia: Secondary | ICD-10-CM

## 2017-08-21 MED ORDER — LOSARTAN POTASSIUM 100 MG PO TABS: 100.0000 mg | ORAL_TABLET | Freq: Every day | ORAL | 1 refills | Status: DC

## 2017-08-21 MED ORDER — TRIAMCINOLONE ACETONIDE 0.1 % EX CREA: 1.0000 "application " | TOPICAL_CREAM | Freq: Two times a day (BID) | CUTANEOUS | 4 refills | Status: DC

## 2017-08-21 MED ORDER — ALPRAZOLAM 0.5 MG PO TABS: ORAL_TABLET | ORAL | 5 refills | Status: DC

## 2017-08-21 MED ORDER — FLUCONAZOLE 150 MG PO TABS: ORAL_TABLET | ORAL | 4 refills | Status: DC

## 2017-08-21 MED ORDER — OMEPRAZOLE 40 MG PO CPDR: DELAYED_RELEASE_CAPSULE | ORAL | 1 refills | Status: DC

## 2017-08-21 MED ORDER — PRAVASTATIN SODIUM 40 MG PO TABS: 40.0000 mg | ORAL_TABLET | Freq: Every day | ORAL | 1 refills | Status: DC

## 2017-08-21 MED ORDER — ZOLPIDEM TARTRATE 5 MG PO TABS: 5.0000 mg | ORAL_TABLET | Freq: Every evening | ORAL | 5 refills | Status: DC | PRN

## 2017-08-21 MED ORDER — LEVOTHYROXINE SODIUM 88 MCG PO TABS: ORAL_TABLET | ORAL | 1 refills | Status: DC

## 2017-08-21 MED ORDER — HYDROCODONE-ACETAMINOPHEN 10-325 MG PO TABS: 1.0000 | ORAL_TABLET | Freq: Four times a day (QID) | ORAL | 0 refills | Status: DC | PRN

## 2017-08-21 MED ORDER — COLCHICINE 0.6 MG PO TABS: ORAL_TABLET | ORAL | 1 refills | Status: DC

## 2017-08-21 MED ORDER — AMLODIPINE BESYLATE 5 MG PO TABS: 5.0000 mg | ORAL_TABLET | Freq: Every day | ORAL | 1 refills | Status: DC

## 2017-08-21 NOTE — Progress Notes (Signed)
   Subjective:    Patient ID: Joanna [name], female    DOB: 01/22/1951, 66 y.o.   MRN: [id number]  HPI Patient is here today to discuss getting a sleep study done and possibly another cpap machine-she had sleep study years ago but now needs a repeat sleep study in order to get new CPAP machine she has had sleep apnea for years Patient cannot afford her Victoza she is in the low currently unfortunately there are no good dietary choices  We went over her lab work in detail today  .Ptaient also states she has a rash that has come up on her all over her body it itches pretty bad. She has been using Kenalog cream on this and it is helping some.  She states it does help some Recently could not afford her toes are because of donut hole with Medicare she is trying to watch her diet   Her urine does show up obtain she does try to watch and in her diet She does have some flareup of the back pain she is trying to do the best she can with it It is Dr. Nicki Reaper looking at his history is trace called me regarding the lab results the her A1c 6.6 which is fair controlled diabetes she does have renal insufficiency we went over her the ways to keep this under control   Review of Systems  Constitutional: Negative for activity change, appetite change and fatigue.  HENT: Negative for congestion.   Respiratory: Negative for cough.   Cardiovascular: Negative for chest pain.  Gastrointestinal: Negative for abdominal pain.  Endocrine: Negative for polydipsia and polyphagia.  Skin: Negative for color change.  Neurological: Negative for weakness.  Psychiatric/Behavioral: Negative for confusion.       Objective:   Physical Exam  Constitutional: She appears well-developed and well-nourished. No distress.  HENT:  Head: Normocephalic and atraumatic.  Eyes: Right eye exhibits no discharge. Left eye exhibits no discharge.  Neck: No tracheal deviation present.  Cardiovascular: Normal rate, regular rhythm and normal  heart sounds.  No murmur heard. Pulmonary/Chest: Effort normal and breath sounds normal. No respiratory distress. She has no wheezes. She has no rales.  Musculoskeletal: She exhibits no edema.  Lymphadenopathy:    She has no cervical adenopathy.  Neurological: She is alert. She exhibits normal muscle tone.  Skin: Skin is warm and dry. No erythema.  Psychiatric: Her behavior is normal.  Vitals reviewed. She does have a rash although appears to be possibly due to by scan I doubt a drug related rash Diabetic foot exam normal 25 minutes was spent with the patient. Greater than half the time was spent in discussion and answering questions and counseling regarding the issues that the patient came in for today.       Assessment & Plan:  Hypothyroidism continue medication watch diet stay physically active Blood pressure decent control continue current measures minimize protein in diet Diabetes fair control fully she can afford her big toes and Renal insufficiency will monitor closely watch protein in diet stay active Hyperlipidemia continue medication watch diet closely old labs reviewed Insomnia Ambien only when necessary cautioned drowsiness Moderate amount of pain with her back hydrocodone when necessary not with any sleeping medicine also not with any nerve medicine she uses that infrequently

## 2017-08-21 NOTE — Addendum Note (Signed)
Addended by: [name] A on: 08/21/2017 03:05 PM   Modules accepted: Orders

## 2017-08-28 ENCOUNTER — Telehealth: Payer: Self-pay | Admitting: Family Medicine

## 2017-08-28 DIAGNOSIS — G4730 Sleep apnea, unspecified: Secondary | ICD-10-CM

## 2017-08-28 NOTE — Addendum Note (Signed)
Addended by: Ofilia Neas R on: 08/28/2017 08:20 AM   Modules accepted: Orders

## 2017-08-28 NOTE — Telephone Encounter (Signed)
Referral ordered in epic for sleep study. Message being forwarded to [facility]

## 2017-08-28 NOTE — Telephone Encounter (Signed)
This patient has sleep apnea.  She was seen just recently.  She completed her Peterson which rates her as a high risk of having sleep apnea.  Please proceed forward with referral for sleep study.  This can be done inpatient or at home depending on her insurance please put in referral then notify Izora Ribas thank you

## 2017-08-29 ENCOUNTER — Telehealth: Payer: Self-pay | Admitting: *Deleted

## 2017-08-29 MED ORDER — AMLODIPINE BESYLATE 5 MG PO TABS: 5.0000 mg | ORAL_TABLET | Freq: Every day | ORAL | 1 refills | Status: DC

## 2017-08-29 NOTE — Telephone Encounter (Signed)
[facility] called to clarify pt's med. Pt was filling amlodipine 2.5mg  and on 11/6 they got rx for 5mg . Pt was seen that day but I did not see in note where med was changed. Please advise.  Laurin Coder would like a call back [phone]

## 2017-08-29 NOTE — Telephone Encounter (Signed)
Nurses 5 mg amlodipine recommended her blood pressure was borderline at 130/90 we would like to see it better-it would be fine to send in 90-day prescription with refill-please inform the patient we are using 5 mg tablets to try to get her blood pressure more in the 120/70 if possible

## 2017-08-29 NOTE — Telephone Encounter (Signed)
New prescription sent to pharmacy with new dose-old prescription cancelled.

## 2017-09-10 ENCOUNTER — Encounter: Payer: Self-pay | Admitting: Family Medicine

## 2017-10-01 ENCOUNTER — Telehealth: Payer: Self-pay | Admitting: Family Medicine

## 2017-10-01 NOTE — Telephone Encounter (Signed)
Pt is requesting that her liraglutide (VICTOZA) 18 MG/3ML SOPN  Be changed to something cheaper. Pt states that it is now 598.   Joanna [name]

## 2017-10-01 NOTE — Telephone Encounter (Signed)
Patient advised Obviously this is now very expensive co-pay.  Unfortunately it would be difficult for [place] to know what her co-pays will be for her other medicines.  Even though this classification of medications tends to be rather expensive usually insurance companies will cover 1 of the choices at a more reasonable cost.  Dr Nicki Reaper would recommend that she speak with her insurance company or her pharmacist to try to find out which would have the lowest co-pay and if it is affordable to her she can tell us the name of that medicine and we can prescribe it.  The other medicine similar to Victoza is Trulicity, Byetta, Bydureon.  These other 3 medicines worked very similar but she will need to discuss this with her insurance company for her drug medicines-in other words Medicare drug plan to find out which one is the most affordable then let us know and we will go from there.  If all of them are tremendously expensive then we will need to change strategies. Patient verbalized understanding and will contact her insurance company and let us know the most cost effective option.

## 2017-10-01 NOTE — Telephone Encounter (Signed)
Patient states the co pay on her victoza went up to almost $600 dollars a month and she needs an alternative.

## 2017-10-01 NOTE — Telephone Encounter (Signed)
Obviously this is now very expensive co-pay.  Unfortunately it would be difficult for [place] to know what her co-pays will be for her other medicines.  Even though this classification of medications tends to be rather expensive usually insurance companies will cover 1 of the choices at a more reasonable cost.  I would recommend that she speak with her insurance company or her pharmacist to try to find out which would have the lowest co-pay and if it is affordable to her she can tell us the name of that medicine and we can prescribe it.  The other medicine similar to Victoza is Trulicity, Byetta, Bydureon.  These other 3 medicines worked very similar but she will need to discuss this with her insurance company for her drug medicines-in other words Medicare drug plan to find out which one is the most affordable then let us know and we will go from there.  If all of them are tremendously expensive then we will need to change strategies

## 2017-10-29 ENCOUNTER — Encounter: Payer: Medicare Other | Admitting: Family Medicine

## 2017-11-06 ENCOUNTER — Encounter: Payer: Medicare Other | Admitting: Family Medicine

## 2017-11-16 ENCOUNTER — Other Ambulatory Visit: Payer: Self-pay | Admitting: *Deleted

## 2017-11-16 MED ORDER — LIRAGLUTIDE 18 MG/3ML ~~LOC~~ SOPN: PEN_INJECTOR | SUBCUTANEOUS | 0 refills | Status: DC

## 2017-11-21 ENCOUNTER — Ambulatory Visit (INDEPENDENT_AMBULATORY_CARE_PROVIDER_SITE_OTHER): Payer: PPO | Admitting: Family Medicine

## 2017-11-21 ENCOUNTER — Encounter: Payer: Self-pay | Admitting: Family Medicine

## 2017-11-21 VITALS — BP 122/74 | Ht 62.0 in | Wt 168.0 lb

## 2017-11-21 DIAGNOSIS — R29898 Other symptoms and signs involving the musculoskeletal system: Secondary | ICD-10-CM

## 2017-11-21 DIAGNOSIS — Z0001 Encounter for general adult medical examination with abnormal findings: Secondary | ICD-10-CM

## 2017-11-21 DIAGNOSIS — M79643 Pain in unspecified hand: Secondary | ICD-10-CM | POA: Diagnosis not present

## 2017-11-21 DIAGNOSIS — Z0000 Encounter for general adult medical examination without abnormal findings: Secondary | ICD-10-CM

## 2017-11-21 DIAGNOSIS — G4730 Sleep apnea, unspecified: Secondary | ICD-10-CM | POA: Diagnosis not present

## 2017-11-21 MED ORDER — ZOSTER VAC RECOMB ADJUVANTED 50 MCG/0.5ML IM SUSR: 0.5000 mL | Freq: Once | INTRAMUSCULAR | 0 refills | Status: AC

## 2017-11-21 NOTE — Progress Notes (Signed)
Subjective:    Patient ID: Joanna [name], female    DOB: 01-03-1951, 67 y.o.   MRN: [id number]  HPI AWV- Annual Wellness Visit  The patient was seen for their annual wellness visit. The patient's past medical history, surgical history, and family history were reviewed. Pertinent vaccines were reviewed ( tetanus, pneumonia, shingles, flu) The patient's medication list was reviewed and updated.  The height and weight were entered.  BMI recorded in electronic record elsewhere  Cognitive screening was completed. Outcome of Mini - Cog: pass   Falls /depression screening electronically recorded within record elsewhere. Pt has had about 4 falls in the past year due to knee problem. Pt states no injuries with falls.   Current tobacco usage: none (All patients who use tobacco were given written and verbal information on quitting)  Recent listing of emergency department/hospitalizations over the past year were reviewed.  current specialist the patient sees on a regular basis: none   Medicare annual wellness visit patient questionnaire was reviewed.  A written screening schedule for the patient for the next 5-10 years was given. Appropriate discussion of followup regarding next visit was discussed.   Hard to grip things with left hand. Started about 5 -6 months ago.  She relates having some intermittent tingling in her hand but she also relates increasing weakness in her left hand had a difficult time grabbing things manipulating things she is having some issues where she dropped stuff she is never had this before  Pt states she was seeing a NP for her knee and was told her heart stopped twice during the exam.  The patient denies any chest tightness pressure pain shortness of breath.  Denies feeling her heart rhythm      Review of Systems  Constitutional: Negative for activity change, appetite change and fatigue.  HENT: Negative for congestion and rhinorrhea.   Eyes: Negative for  discharge.  Respiratory: Negative for cough, chest tightness and wheezing.   Cardiovascular: Negative for chest pain.  Gastrointestinal: Negative for abdominal pain, blood in stool and vomiting.  Endocrine: Negative for polyphagia.  Genitourinary: Negative for difficulty urinating and frequency.  Musculoskeletal: Negative for neck pain.  Skin: Negative for color change.  Allergic/Immunologic: Negative for environmental allergies and food allergies.  Neurological: Negative for weakness and headaches.  Psychiatric/Behavioral: Negative for agitation and behavioral problems.       Objective:   Physical Exam  Constitutional: She is oriented to person, place, and time. She appears well-developed and well-nourished.  HENT:  Head: Normocephalic and atraumatic.  Right Ear: External ear normal.  Left Ear: External ear normal.  Eyes: Right eye exhibits no discharge. Left eye exhibits no discharge.  Neck: Normal range of motion. No tracheal deviation present.  Cardiovascular: Normal rate, regular rhythm, normal heart sounds and intact distal pulses. Exam reveals no gallop.  No murmur heard. Pulmonary/Chest: Effort normal and breath sounds normal. No stridor. No respiratory distress. She has no wheezes. She has no rales.  Abdominal: Soft. Bowel sounds are normal. She exhibits no distension and no mass. There is no tenderness. There is no rebound and no guarding.  Musculoskeletal: Normal range of motion. She exhibits no edema or tenderness.  Lymphadenopathy:    She has no cervical adenopathy.  Neurological: She is alert and oriented to person, place, and time. She exhibits normal muscle tone.  Skin: Skin is warm and dry.  Psychiatric: She has a normal mood and affect. Her behavior is normal.  Significant weakness of the  intrinsic muscles of the left hand positive Tinel's no atrophy seen No sign of any type of skipped beats or heart arrhythmia issue  Pelvic exam normal no masses felt scar tissue  at the end of the  uterus is noted no masses felt also breast exam normal bilateral Patient denies being depressed    Assessment & Plan:  Adult wellness-complete.wellness physical was conducted today. Importance of diet and exercise were discussed in detail. In addition to this a discussion regarding safety was also covered. We also reviewed over immunizations and gave recommendations regarding current immunization needed for age. In addition to this additional areas were also touched on including: Preventative health exams needed: Colonoscopy up-to-date on colonoscopy  Patient was advised yearly wellness exam 1. Routine general medical examination at a health care facility See commentary above patient was encouraged to eat healthy continue her medication  2. Pain of hand, unspecified laterality Patient will need nerve conduction study possibly EMG having some tingling in the hand - Ambulatory referral to Neurology  3. Sleep apnea, unspecified type Patient will need sleep study it was scheduled a while back but it got canceled because of sickness she needs it rescheduled - Ambulatory referral to Sleep Studies  4. Left hand weakness Significant left hand weakness on physical exam this needs further looking into nerve conduction with possible EMG it is possible it could be a nerve impingement either in her wrist elbow or neck

## 2017-11-28 ENCOUNTER — Encounter: Payer: Self-pay | Admitting: Family Medicine

## 2017-11-29 ENCOUNTER — Ambulatory Visit: Payer: PPO | Attending: Family Medicine | Admitting: Neurology

## 2017-11-29 DIAGNOSIS — R5383 Other fatigue: Secondary | ICD-10-CM | POA: Insufficient documentation

## 2017-11-29 DIAGNOSIS — G4733 Obstructive sleep apnea (adult) (pediatric): Secondary | ICD-10-CM | POA: Insufficient documentation

## 2017-11-29 DIAGNOSIS — R400 Somnolence: Secondary | ICD-10-CM | POA: Insufficient documentation

## 2017-11-29 DIAGNOSIS — R0683 Snoring: Secondary | ICD-10-CM | POA: Diagnosis not present

## 2017-12-03 ENCOUNTER — Other Ambulatory Visit (HOSPITAL_BASED_OUTPATIENT_CLINIC_OR_DEPARTMENT_OTHER): Payer: Self-pay

## 2017-12-03 DIAGNOSIS — G4710 Hypersomnia, unspecified: Secondary | ICD-10-CM

## 2017-12-03 DIAGNOSIS — R0683 Snoring: Secondary | ICD-10-CM

## 2017-12-03 DIAGNOSIS — G4733 Obstructive sleep apnea (adult) (pediatric): Secondary | ICD-10-CM

## 2017-12-03 DIAGNOSIS — R5383 Other fatigue: Secondary | ICD-10-CM

## 2017-12-10 ENCOUNTER — Telehealth: Payer: Self-pay | Admitting: Family Medicine

## 2017-12-10 NOTE — Telephone Encounter (Signed)
Written on the counter for signature.

## 2017-12-10 NOTE — Telephone Encounter (Signed)
Patient is requesting new prescription for AcuChek Guide Meter with test stripes and prescription for needles called into [facility] [address]

## 2017-12-11 ENCOUNTER — Telehealth: Payer: Self-pay | Admitting: Family Medicine

## 2017-12-11 MED ORDER — INSULIN PEN NEEDLE 32G X 4 MM MISC: 1 refills | Status: DC

## 2017-12-11 NOTE — Telephone Encounter (Signed)
Pt is needing a prescription for nano needles for her victoza injections.     [facility] [address]

## 2017-12-11 NOTE — Telephone Encounter (Signed)
Prescription sent electronically to pharmacy. Patient notified. 

## 2017-12-11 NOTE — Procedures (Signed)
Orinda A. Merlene Laughter, MD     [email or web address]             NOCTURNAL POLYSOMNOGRAPHY   LOCATION: [facility]   Patient Name: Joanna [name], Joanna [name] Date: 11/29/2017 Gender: Female D.O.B: [birth year] Age (years): 65 Referring Provider: [name] Height (inches): 62 Interpreting Physician: Phillips Odor MD, ABSM Weight (lbs): 173 RPSGT: Peak, Robert BMI: 32 MRN: [id number] Neck Size: 15.50 CLINICAL INFORMATION Sleep Study Type: Split Night CPAP  Indication for sleep study: N/A  Epworth Sleepiness Score: 4  SLEEP STUDY TECHNIQUE As per the AASM Manual for the Scoring of Sleep and Associated Events v2.3 (April 2016) with a hypopnea requiring 4% desaturations.  The channels recorded and monitored were frontal, central and occipital EEG, electrooculogram (EOG), submentalis EMG (chin), nasal and oral airflow, thoracic and abdominal wall motion, anterior tibialis EMG, snore microphone, electrocardiogram, and pulse oximetry. Continuous positive airway pressure (CPAP) was initiated when the patient met split night criteria and was titrated according to treat sleep-disordered breathing.  MEDICATIONS Medications self-administered by patient taken the night of the study : N/A  Current Outpatient Medications:  .  albuterol (PROVENTIL HFA;VENTOLIN HFA) 108 (90 Base) MCG/ACT inhaler, Inhale 2 puffs into the lungs every 6 (six) hours as needed for wheezing or shortness of breath., Disp: 1 Inhaler, Rfl: 2 .  ALPRAZolam (XANAX) 0.5 MG tablet, 1/2 to 1 po BID prn anxiety, Disp: 30 tablet, Rfl: 5 .  amLODipine (NORVASC) 5 MG tablet, Take 1 tablet (5 mg total) daily by mouth., Disp: 90 tablet, Rfl: 1 .  colchicine 0.6 MG tablet, 1 bid prn gout, Disp: 90 tablet, Rfl: 1 .  fexofenadine (ALLEGRA) 180 MG tablet, Take 180 mg by mouth daily., Disp: , Rfl:  .  fluticasone (FLONASE) 50 MCG/ACT nasal spray, PLACE 2 SPRAYS INTO THE NOSE AS NEEDED., Disp: 16 g, Rfl: 5 .   HYDROcodone-acetaminophen (NORCO) 10-325 MG tablet, Take 1 tablet every 6 (six) hours as needed by mouth., Disp: 60 tablet, Rfl: 0 .  indapamide (LOZOL) 2.5 MG tablet, Take 1 tablet (2.5 mg total) by mouth every morning., Disp: 90 tablet, Rfl: 2 .  Insulin Pen Needle 32G X 4 MM MISC, Use with Victoza as directed, Disp: 100 each, Rfl: 1 .  levothyroxine (SYNTHROID, LEVOTHROID) 88 MCG tablet, TAKE 1 TABLET BY MOUTH DAILY. GENERIC EQUIVALENT FOR SYNTHROID, Disp: 90 tablet, Rfl: 1 .  liraglutide (VICTOZA) 18 MG/3ML SOPN, INJECT 1.2 MG INTO THE SKIN DAILY, Disp: 6 pen, Rfl: 0 .  losartan (COZAAR) 100 MG tablet, Take 1 tablet (100 mg total) daily by mouth., Disp: 90 tablet, Rfl: 1 .  Magnesium 250 MG TABS, Take 250 mg by mouth daily. , Disp: , Rfl:  .  omeprazole (PRILOSEC) 40 MG capsule, TAKE 1 CAPSULE BY MOUTH EVERY DAY, Disp: 90 capsule, Rfl: 1 .  pravastatin (PRAVACHOL) 40 MG tablet, Take 1 tablet (40 mg total) daily by mouth., Disp: 90 tablet, Rfl: 1 .  triamcinolone cream (KENALOG) 0.1 %, Apply 1 application topically 2 (two) times daily., Disp: 45 g, Rfl: 4 .  triamcinolone cream (KENALOG) 0.1 %, Apply 1 application 2 (two) times daily topically., Disp: 90 g, Rfl: 4 .  zolpidem (AMBIEN) 5 MG tablet, Take 1 tablet (5 mg total) at bedtime as needed by mouth for sleep., Disp: 30 tablet, Rfl: 5  RESPIRATORY PARAMETERS Diagnostic  Total AHI (/hr): 62.6 RDI (/hr): 68.8 OA Index (/hr): 12.3 CA Index (/hr): 0.0 REM AHI (/hr): N/A NREM AHI (/hr): 62.6  Supine AHI (/hr): N/A Non-supine AHI (/hr): 62.61 Min O2 Sat (%): 73.00 Mean O2 (%): 88.52 Time below 88% (min): 59.6   Titration  Optimal Pressure (cm): 11 AHI at Optimal Pressure (/hr): 1.2 Min O2 at Optimal Pressure (%): 92.0 Supine % at Optimal (%): 0 Sleep % at Optimal (%): 91   SLEEP ARCHITECTURE The recording time for the entire night was 421.5 minutes.  During a baseline period of 197.3 minutes, the patient slept for 126.5 minutes in REM and  nonREM, yielding a sleep efficiency of 64.1%. Sleep onset after lights out was 11.8 minutes with a REM latency of N/A minutes. The patient spent 37.15% of the night in stage N1 sleep, 62.45% in stage N2 sleep, 0.40% in stage N3 and 0.00% in REM.  During the titration period of 222.1 minutes, the patient slept for 200.1 minutes in REM and nonREM, yielding a sleep efficiency of 90.1%. Sleep onset after CPAP initiation was 10.5 minutes with a REM latency of 3.5 minutes. The patient spent 8.49% of the night in stage N1 sleep, 69.77% in stage N2 sleep, 0.25% in stage N3 and 21.48% in REM.  CARDIAC DATA The 2 lead EKG demonstrated sinus rhythm. The mean heart rate was 77.15 beats per minute. Other EKG findings include: None. LEG MOVEMENT DATA The total Periodic Limb Movements of Sleep (PLMS) were 0. The PLMS index was 0.00.  IMPRESSIONS Severe obstructive sleep apnea occurred during the diagnostic portion of the study (AHI = 62.6/hour). The optimal CPAP selected for this patient is (11 cm of water).   Delano Metz, MD Diplomate, American Board of Sleep Medicine.  ELECTRONICALLY SIGNED ON:  12/11/2017, 10:46 PM [place] PH: [phone]   FX: [phone] [place]

## 2017-12-12 ENCOUNTER — Telehealth: Payer: Self-pay | Admitting: Family Medicine

## 2017-12-12 NOTE — Telephone Encounter (Signed)
Results discussed with patient. Patient advised per Dr Nicki Reaper Sleep Study showed severe sleep apnea.  It is recommended for this patient to be on a CPAP machine.  Nurses-it is important talk with the patient let the patient know that very important for the patient to use a CPAP machine we can go ahead and issue a order for this, if the patient once to come in to discuss sleep apnea further we will be happy to do so with an office visit.  Otherwise we will help arrange for CPAP machine-please find out what the patient would like to do at this point. Patient verbalized understanding and declines appointment and would like to proceed with CPAP. Patient states she will contact her insurance company to see where she needs to get the CPAP machine and call us back and notify us.

## 2017-12-12 NOTE — Telephone Encounter (Signed)
Patient states her insurance states [place] would be the best place to have done at. [facility] let me know if there is something I can help with.

## 2017-12-12 NOTE — Telephone Encounter (Signed)
Patient with severe sleep apnea.  It is recommended for this patient to be on a CPAP machine.  Nurses-it is important talk with the patient let the patient know that very important for the patient to use a CPAP machine we can go ahead and issue a order for this, if the patient once to come in to discuss sleep apnea further we will be happy to do so with an office visit.  Otherwise we will help arrange for CPAP machine-please find out what the patient would like to do at this point

## 2017-12-12 NOTE — Telephone Encounter (Signed)
Please sign order for [employer] & forward to [facility] to fax with all required documentation  In red folder in yellow box

## 2017-12-14 ENCOUNTER — Encounter: Payer: Self-pay | Admitting: Nurse Practitioner

## 2017-12-14 ENCOUNTER — Ambulatory Visit (INDEPENDENT_AMBULATORY_CARE_PROVIDER_SITE_OTHER): Payer: PPO | Admitting: Nurse Practitioner

## 2017-12-14 VITALS — BP 124/86 | Temp 98.0°F | Ht 62.0 in | Wt 167.4 lb

## 2017-12-14 DIAGNOSIS — J019 Acute sinusitis, unspecified: Secondary | ICD-10-CM | POA: Diagnosis not present

## 2017-12-14 MED ORDER — AMOXICILLIN-POT CLAVULANATE 875-125 MG PO TABS: 1.0000 | ORAL_TABLET | Freq: Two times a day (BID) | ORAL | 0 refills | Status: DC

## 2017-12-14 NOTE — Progress Notes (Signed)
Subjective:    Patient ID: Joanna [name], female    DOB: 07-09-1951, 67 y.o.   MRN: [id number]  HPI patient presents today with complaints of R ear pain that has gradually worsened through the night. She describes the pain as a throbbing, stabbing pain. She denies fever, chill, or drainage. Patient states she has developed a dry cough and sneezing this morning. Nasal drainage is clear.   Past Medical History:  Diagnosis Date  . AVM (arteriovenous malformation) of small bowel, acquired ([place]) JAN 2016 GIVENS  . Depression   . Diabetes mellitus   . Diverticulosis   . Dyslipidemia   . GERD (gastroesophageal reflux disease)   . Hypertension   . Insomnia   . Microproteinuria   . Renal insufficiency 2010  . Sleep apnea   . Thyroid disease    hypothyroid    Past Surgical History:  Procedure Laterality Date  . ABDOMINAL HYSTERECTOMY  1984  . BACK SURGERY    . BILATERAL OOPHORECTOMY    . COLONOSCOPY  01/29/12   Fields-2-3 hyperplastic polyps, mod internal hemorrhoids, diverticulosis throughout colon  . COLONOSCOPY N/A 07/03/2014   Procedure: COLONOSCOPY;  Surgeon: [name], MD;  Location: [facility];  Service: Endoscopy;  Laterality: N/A;  230-moved to 8:30 - Ginger notified pt  . ESOPHAGOGASTRODUODENOSCOPY   01/29/2012   Fields-2-3cm sliding HH, fundic gland polyps, NEGATIVE H pylori  . ESOPHAGOGASTRODUODENOSCOPY N/A 07/03/2014   Procedure: ESOPHAGOGASTRODUODENOSCOPY (EGD);  Surgeon: [name], MD;  Location: [facility];  Service: Endoscopy;  Laterality: N/A;  . GIVENS CAPSULE STUDY N/A 10/26/2014   SMALL BOWEL AVMs  . KNEE ARTHROSCOPY Right   . TOTAL ABDOMINAL HYSTERECTOMY W/ BILATERAL SALPINGOOPHORECTOMY      Family History  Problem Relation Age of Onset  . Cardiomyopathy Father   . Heart failure Mother   . Hyperlipidemia Mother   . Stroke Brother   . Cancer Brother        renal  . Kidney cancer Brother   . Diabetes Paternal Uncle   . Heart disease Paternal  Grandmother    Social History   Tobacco Use  . Smoking status: Former Smoker    Packs/day: 0.50    Years: 30.00    Pack years: 15.00    Types: Cigarettes    Last attempt to quit: 10/16/2010    Years since quitting: 7.1  . Smokeless tobacco: Former Systems developer    Quit date: 10/28/2010  Substance Use Topics  . Alcohol use: Yes    Comment: once per mo, socially  . Drug use: No   Current Meds  Medication Sig  . albuterol (PROVENTIL HFA;VENTOLIN HFA) 108 (90 Base) MCG/ACT inhaler Inhale 2 puffs into the lungs every 6 (six) hours as needed for wheezing or shortness of breath.  . ALPRAZolam (XANAX) 0.5 MG tablet 1/2 to 1 po BID prn anxiety  . amLODipine (NORVASC) 5 MG tablet Take 1 tablet (5 mg total) daily by mouth.  . colchicine 0.6 MG tablet 1 bid prn gout  . fexofenadine (ALLEGRA) 180 MG tablet Take 180 mg by mouth daily.  . fluticasone (FLONASE) 50 MCG/ACT nasal spray PLACE 2 SPRAYS INTO THE NOSE AS NEEDED.  Marland Kitchen HYDROcodone-acetaminophen (NORCO) 10-325 MG tablet Take 1 tablet every 6 (six) hours as needed by mouth.  . indapamide (LOZOL) 2.5 MG tablet Take 1 tablet (2.5 mg total) by mouth every morning.  . Insulin Pen Needle 32G X 4 MM MISC Use with Victoza as directed  .  levothyroxine (SYNTHROID, LEVOTHROID) 88 MCG tablet TAKE 1 TABLET BY MOUTH DAILY. GENERIC EQUIVALENT FOR SYNTHROID  . liraglutide (VICTOZA) 18 MG/3ML SOPN INJECT 1.2 MG INTO THE SKIN DAILY  . losartan (COZAAR) 100 MG tablet Take 1 tablet (100 mg total) daily by mouth.  . Magnesium 250 MG TABS Take 250 mg by mouth daily.   Marland Kitchen omeprazole (PRILOSEC) 40 MG capsule TAKE 1 CAPSULE BY MOUTH EVERY DAY  . pravastatin (PRAVACHOL) 40 MG tablet Take 1 tablet (40 mg total) daily by mouth.  . triamcinolone cream (KENALOG) 0.1 % Apply 1 application topically 2 (two) times daily.  Marland Kitchen triamcinolone cream (KENALOG) 0.1 % Apply 1 application 2 (two) times daily topically.  Marland Kitchen zolpidem (AMBIEN) 5 MG tablet Take 1 tablet (5 mg total) at bedtime as  needed by mouth for sleep.   Allergies  Allergen Reactions  . Ivp Dye [Iodinated Diagnostic Agents]   . Zoloft [Sertraline Hcl]     Withdrawn,bad dreams     Review of Systems  Constitutional: Negative for chills and fever.  HENT: Positive for rhinorrhea, sinus pressure, sinus pain and sneezing. Negative for sore throat.        R ear pain. No drainage  Respiratory: Positive for cough. Negative for chest tightness and shortness of breath.        Dry, non productive cough       Objective:   Physical Exam  Constitutional: She appears well-developed and well-nourished.  HENT:  Sinuses-Frontal, non-tender. Maxillary, tenderness to palpation.  R Ear- External: no lesions, erythema, or edema. Canal: No erythema, exudate, or lesions. TM with cloudy fluid. No bulging or erythema to TM.  L Ear- External: no lesions, erythema, or edema. Canal: no erythema, exudate or lesions. TM with mild retraction and sclerotic changes.  Oropharynx- mild erythema with cloudy post nasal drainage.   Neck:  Mild, non-tender, anterior cervical adenopathy  Cardiovascular: Normal rate, regular rhythm and normal heart sounds.  Pulmonary/Chest: Effort normal and breath sounds normal.   Vitals:   12/14/17 1323  BP: 124/86  Temp: 98 F (36.7 C)       Assessment & Plan:   Problem List Items Addressed This Visit    None    Visit Diagnoses    Acute rhinosinusitis    -  Primary   Relevant Medications   amoxicillin-clavulanate (AUGMENTIN) 875-125 MG tablet     Meds ordered this encounter  Medications  . amoxicillin-clavulanate (AUGMENTIN) 875-125 MG tablet    Sig: Take 1 tablet by mouth 2 (two) times daily.    Dispense:  20 tablet    Refill:  0    Order Specific Question:   Supervising Provider    Answer:   [name] [[id number]]    Plan -Medication: begin taking Augmentin for acute rhinosinusitis. Ibuprofen and heating pad for ear pain. Flonase and allegra for congestion and allergies.    -Follow up: Warning signs reviewed. Return if symptoms persist or worsen.

## 2017-12-15 ENCOUNTER — Encounter: Payer: Self-pay | Admitting: Nurse Practitioner

## 2017-12-17 NOTE — Telephone Encounter (Signed)
These were signed

## 2017-12-17 NOTE — Telephone Encounter (Signed)
Order for AutoCPAP & all required documentation faxed to [facility] & notified pt that they will contact her to set up  [facility]

## 2017-12-24 ENCOUNTER — Ambulatory Visit (INDEPENDENT_AMBULATORY_CARE_PROVIDER_SITE_OTHER): Payer: PPO | Admitting: Neurology

## 2017-12-24 ENCOUNTER — Encounter: Payer: Self-pay | Admitting: Neurology

## 2017-12-24 DIAGNOSIS — G5601 Carpal tunnel syndrome, right upper limb: Secondary | ICD-10-CM | POA: Insufficient documentation

## 2017-12-24 HISTORY — DX: Carpal tunnel syndrome, right upper limb: G56.01

## 2017-12-24 NOTE — Procedures (Signed)
     HISTORY:  Joanna [name] is a 67 year old patient with a history of left hand numbness in the fourth and fifth fingers unassociated with any neck or shoulder discomfort that has been going on for about 10 months.  The patient has some slight tingling in the right hand as well.  She is being evaluated for the left hand symptoms.  NERVE CONDUCTION STUDIES:  Nerve conduction studies were performed on both upper extremities.  The distal motor latency for the right median nerve was prolonged with a low motor amplitude.  The distal motor latency and motor amplitude for the left median nerve is normal, and the study of the left ulnar nerve is also normal.  The nerve conduction velocities for the median nerves bilaterally and for the left ulnar nerve were normal. The sensory latencies for the median nerves were prolonged on the right and normal on the left and normal for the ulnar nerves bilaterally.  The F-wave latency for the left ulnar nerve was normal.   EMG STUDIES:  EMG study was performed on the left upper extremity:  The first dorsal interosseous muscle reveals 2 to 4 K units with full recruitment. No fibrillations or positive waves were noted. The abductor pollicis brevis muscle reveals 2 to 4 K units with full recruitment. No fibrillations or positive waves were noted. The extensor indicis proprius muscle reveals 1 to 3 K units with full recruitment. No fibrillations or positive waves were noted. The pronator teres muscle reveals 2 to 3 K units with full recruitment. No fibrillations or positive waves were noted. The biceps muscle reveals 1 to 2 K units with full recruitment. No fibrillations or positive waves were noted. The triceps muscle reveals 2 to 4 K units with full recruitment. No fibrillations or positive waves were noted. The anterior deltoid muscle reveals 2 to 3 K units with full recruitment. No fibrillations or positive waves were noted. The cervical paraspinal muscles were  tested at 2 levels. No abnormalities of insertional activity were seen at either level tested. There was good relaxation.   IMPRESSION:  The nerve conduction studies of both upper extremities shows evidence of a right mild carpal tunnel syndrome.  Nerve conduction studies of the left upper extremity do not show evidence of a neuropathy.  EMG evaluation of the left upper extremity was unremarkable, no evidence of an overlying cervical radiculopathy was seen.  Jill Alexanders MD 12/24/2017 1:38 PM  [facility] [address] [place], [place] [address]  Phone [phone] Fax [phone]

## 2017-12-24 NOTE — Progress Notes (Signed)
Please refer to EMG and nerve conduction study procedure note. 

## 2017-12-25 ENCOUNTER — Encounter: Payer: Self-pay | Admitting: Family Medicine

## 2017-12-25 ENCOUNTER — Telehealth: Payer: Self-pay | Admitting: Family Medicine

## 2017-12-25 DIAGNOSIS — G4730 Sleep apnea, unspecified: Secondary | ICD-10-CM

## 2017-12-25 HISTORY — DX: Sleep apnea, unspecified: G47.30

## 2017-12-25 NOTE — Telephone Encounter (Signed)
Patient is aware of all and wanting a follow up appt.I sent the pt up front to set up an appt.

## 2017-12-25 NOTE — Progress Notes (Signed)
Patient has been started on CPAP

## 2017-12-25 NOTE — Telephone Encounter (Signed)
Nurses-please tell the patient that I did receive the nerve conduction studies she had.  It does not show any evidence of cervical impingement causing her numbness.  She does have a with mild carpal tunnel in the right hand.  The left side came out okay.  Typically it is recommended to wear a Velcro brace at nighttime on the wrist that typically will help hold the wrist straight.  That can minimize some of the numbness.  Also if the patient is interested we can set her up with a hand specialist they can do injections to help this or in some cases surgery.  Please talk with the patient and find out what she would like to do-also if she would like to schedule a follow-up visit to discuss this further that would be perfectly fine

## 2017-12-26 NOTE — Progress Notes (Signed)
   [place]    Nerve / Sites Muscle Latency Ref. Amplitude Ref. Rel Amp Segments Distance Velocity Ref. Area    ms ms mV mV %  cm m/s m/s mVms  L Median - APB     Wrist APB 3.2 ?4.4 8.8 ?4.0 100 Wrist - APB 7   33.2     Upper arm APB 7.1  7.7  88.1 Upper arm - Wrist 21 53 ?49 30.1  R Median - APB     Wrist APB 4.4 ?4.4 3.6 ?4.0 100 Wrist - APB 7   15.9     Upper arm APB 8.4  3.2  87.7 Upper arm - Wrist 21 52 ?49 11.7  L Ulnar - ADM     Wrist ADM 2.5 ?3.3 9.7 ?6.0 100 Wrist - ADM 7   31.2     B.Elbow ADM 5.9  8.3  84.7 B.Elbow - Wrist 18 52 ?49 25.1     A.Elbow ADM 7.8  8.9  108 A.Elbow - B.Elbow 10 55 ?49 28.4         A.Elbow - Wrist               SNC    Nerve / Sites Rec. Site Peak Lat Ref.  Amp Ref. Segments Distance Peak Diff Ref.    ms ms V V  cm ms ms  L Median, Ulnar - Transcarpal comparison     Median Palm Wrist 2.2 ?2.2 36 ?35 Median Palm - Wrist 8       Ulnar Palm Wrist 1.8 ?2.2 15 ?12 Ulnar Palm - Wrist 8          Median Palm - Ulnar Palm  0.4 ?0.4  R Median, Ulnar - Transcarpal comparison     Median Palm Wrist 3.0 ?2.2 10 ?35 Median Palm - Wrist 8       Ulnar Palm Wrist 1.8 ?2.2 9 ?12 Ulnar Palm - Wrist 8          Median Palm - Ulnar Palm  1.2 ?0.4  L Median - Orthodromic (Dig II, Mid palm)     Dig II Wrist 2.9 ?3.4 13 ?10 Dig II - Wrist 13    R Median - Orthodromic (Dig II, Mid palm)     Dig II Wrist 4.0 ?3.4 7 ?10 Dig II - Wrist 13    L Ulnar - Orthodromic, (Dig V, Mid palm)     Dig V Wrist 2.3 ?3.1 11 ?5 Dig V - Wrist 11    R Ulnar - Orthodromic, (Dig V, Mid palm)     Dig V Wrist 2.2 ?3.1 9 ?5 Dig V - Wrist 54                   F  Wave    Nerve F Lat Ref.   ms ms  L Ulnar - ADM 25.7 ?32.0       EMG full

## 2017-12-27 ENCOUNTER — Ambulatory Visit (INDEPENDENT_AMBULATORY_CARE_PROVIDER_SITE_OTHER): Payer: PPO | Admitting: Family Medicine

## 2017-12-27 ENCOUNTER — Encounter: Payer: Self-pay | Admitting: Family Medicine

## 2017-12-27 VITALS — BP 112/80 | Ht 62.0 in | Wt 167.0 lb

## 2017-12-27 DIAGNOSIS — E039 Hypothyroidism, unspecified: Secondary | ICD-10-CM

## 2017-12-27 DIAGNOSIS — D509 Iron deficiency anemia, unspecified: Secondary | ICD-10-CM

## 2017-12-27 DIAGNOSIS — G5603 Carpal tunnel syndrome, bilateral upper limbs: Secondary | ICD-10-CM

## 2017-12-27 DIAGNOSIS — Z79899 Other long term (current) drug therapy: Secondary | ICD-10-CM | POA: Diagnosis not present

## 2017-12-27 DIAGNOSIS — E119 Type 2 diabetes mellitus without complications: Secondary | ICD-10-CM | POA: Diagnosis not present

## 2017-12-27 DIAGNOSIS — J019 Acute sinusitis, unspecified: Secondary | ICD-10-CM | POA: Diagnosis not present

## 2017-12-27 DIAGNOSIS — N289 Disorder of kidney and ureter, unspecified: Secondary | ICD-10-CM | POA: Diagnosis not present

## 2017-12-27 DIAGNOSIS — Z780 Asymptomatic menopausal state: Secondary | ICD-10-CM | POA: Diagnosis not present

## 2017-12-27 DIAGNOSIS — E7849 Other hyperlipidemia: Secondary | ICD-10-CM

## 2017-12-27 DIAGNOSIS — I10 Essential (primary) hypertension: Secondary | ICD-10-CM

## 2017-12-27 MED ORDER — CEFDINIR 300 MG PO CAPS: 300.0000 mg | ORAL_CAPSULE | Freq: Two times a day (BID) | ORAL | 0 refills | Status: AC

## 2017-12-27 NOTE — Progress Notes (Signed)
Subjective:    Patient ID: Joanna [name], female    DOB: [birth year], 67 y.o.   MRN: [id number]  HPI  Patient is here today to discuss bilateral carpal tunnel.Patient recently had a nerve conduction study,which showed mild carpal tunnel in right hand. Left side came out ok. She has not been wearing the night time brace as she states she does not have one. She states she does not know if she is willing to be referred to hand specialist as of yet.  The patient relates that she does not want to have any type of treatment for this at this time because her grandson has been drinking  Patient here for follow-up regarding cholesterol.  Patient does try to maintain a reasonable diet.  Patient does take the medication on a regular basis.  Denies missing a dose.  The patient denies any obvious side effects.  Prior blood work results reviewed with the patient.  The patient is aware of his cholesterol goals and the need to keep it under good control to lessen the risk of disease.  Patient suffers with insomnia.  This is been going on for a while.  The patient finds it necessary to use medication to help sleep.  Patient finds it if not using medication has significant troubles.  Denies abusing the medication.  Denies any negative side effects.  Patient has thyroid condition.  Takes thyroid medication on a regular basis.  States that the proper way.  Relates compliance.  States no negative side effects.  States condition seems to be under good control.  Patient for blood pressure check up. Patient relates compliance with meds. Todays BP reviewed with the patient. Patient denies issues with medication. Patient relates reasonable diet. Patient tries to minimize salt. Patient aware of BP goals.  Patient has intermittent history of anemia has not had any rectal bleeding recently we are at the point where we need to do testing on her  Patient has a history of postmenopausal she does need bone density  testing.  Review of Systems  Constitutional: Negative for activity change, appetite change, fatigue and fever.  HENT: Positive for congestion and rhinorrhea. Negative for ear pain.   Eyes: Negative for discharge.  Respiratory: Positive for cough. Negative for shortness of breath and wheezing.   Cardiovascular: Negative for chest pain.  Gastrointestinal: Negative for abdominal pain.  Endocrine: Negative for polydipsia and polyphagia.  Genitourinary: Negative for dysuria, flank pain and frequency.  Skin: Negative for color change.  Neurological: Negative for weakness.  Psychiatric/Behavioral: Negative for confusion.       Objective:   Physical Exam  Constitutional: She appears well-developed and well-nourished. No distress.  HENT:  Head: Normocephalic and atraumatic.  Eyes: Right eye exhibits no discharge. Left eye exhibits no discharge.  Neck: No tracheal deviation present.  Cardiovascular: Normal rate, regular rhythm and normal heart sounds.  No murmur heard. Pulmonary/Chest: Effort normal and breath sounds normal. No respiratory distress. She has no wheezes. She has no rales.  Musculoskeletal: She exhibits no edema.  Lymphadenopathy:    She has no cervical adenopathy.  Neurological: She is alert. She exhibits normal muscle tone.  Skin: Skin is warm and dry. No erythema.  Psychiatric: Her behavior is normal.  Vitals reviewed.  Patient with positive [place] Nerve conduction study reviewed in detail       Assessment & Plan:  Carpal tunnel-worse on the right than the left patient chooses to use braces for now she has good strength in  her hands she states that she will request referral after her grandsons heart surgery in June she will let us know  Sinusitis-Patient was seen today for upper respiratory illness. It is felt that the patient is dealing with sinusitis. Antibiotics were prescribed today. Importance of compliance with medication was discussed. Symptoms should  gradually resolve over the course of the next several days. If high fevers, progressive illness, difficulty breathing, worsening condition or failure for symptoms to improve over the next several days then the patient is to follow-up. If any emergent conditions the patient is to follow-up in the emergency department otherwise to follow-up in the office.  Moderate renal insufficiency she understands the importance of healthy eating avoiding excessive fluids plus also minimizing protein is  She has a history of anemia denies any rectal bleeding but we will check CBC  The patient was seen today as part of an evaluation regarding hyperlipidemia. Recent lab work has been reviewed with the patient as well as the goals for good cholesterol care. In addition to this medications have been discussed the importance of compliance with diet and medications discussed as well. Patient has been informed of potential side effects of medications in the importance to notify us should any problems occur. Finally the patient is aware that poor control of cholesterol, noncompliance can dramatically increase her risk of heart attack strokes and premature death. The patient will keep regular office visits and the patient does agreed to periodic lab work.  Patient does have hypothyroidism and takes medication will check TSH await the results  HTN- Patient was seen today as part of a visit regarding hypertension. The importance of healthy diet and regular physical activity was discussed. The importance of compliance with medications discussed. Ideal goal is to keep blood pressure low elevated levels certainly below 295/28 when possible. The patient was counseled that keeping blood pressure under control lessen his risk of heart attack, stroke, kidney failure, and early death. The importance of regular follow-ups was discussed with the patient. Low-salt diet such as DASH recommended. Regular physical activity was recommended as  well. Patient was advised to keep regular follow-ups. Blood pressure under good control today  Also needs bone density testing  Diabetes A1c under good control  Uses Xanax rarely.  Basis for anxiety  Has moderate insomnia uses medication for this without it she does not sleep she denies it causing problems

## 2018-01-01 DIAGNOSIS — G4733 Obstructive sleep apnea (adult) (pediatric): Secondary | ICD-10-CM | POA: Diagnosis not present

## 2018-01-03 ENCOUNTER — Encounter: Payer: Self-pay | Admitting: Family Medicine

## 2018-01-03 ENCOUNTER — Ambulatory Visit (INDEPENDENT_AMBULATORY_CARE_PROVIDER_SITE_OTHER): Payer: PPO | Admitting: Family Medicine

## 2018-01-03 VITALS — BP 120/80 | Temp 98.1°F | Ht 62.0 in | Wt 168.0 lb

## 2018-01-03 DIAGNOSIS — I10 Essential (primary) hypertension: Secondary | ICD-10-CM | POA: Diagnosis not present

## 2018-01-03 DIAGNOSIS — R309 Painful micturition, unspecified: Secondary | ICD-10-CM

## 2018-01-03 DIAGNOSIS — N289 Disorder of kidney and ureter, unspecified: Secondary | ICD-10-CM | POA: Diagnosis not present

## 2018-01-03 DIAGNOSIS — E7849 Other hyperlipidemia: Secondary | ICD-10-CM | POA: Diagnosis not present

## 2018-01-03 DIAGNOSIS — E119 Type 2 diabetes mellitus without complications: Secondary | ICD-10-CM | POA: Diagnosis not present

## 2018-01-03 DIAGNOSIS — E039 Hypothyroidism, unspecified: Secondary | ICD-10-CM | POA: Diagnosis not present

## 2018-01-03 DIAGNOSIS — Z79899 Other long term (current) drug therapy: Secondary | ICD-10-CM | POA: Diagnosis not present

## 2018-01-03 LAB — POCT URINALYSIS DIPSTICK
Blood, UA: NEGATIVE
Leukocytes, UA: NEGATIVE
Spec Grav, UA: 1.02 (ref 1.010–1.025)
pH, UA: 5 (ref 5.0–8.0)

## 2018-01-03 MED ORDER — CEFPROZIL 500 MG PO TABS: 500.0000 mg | ORAL_TABLET | Freq: Two times a day (BID) | ORAL | 0 refills | Status: DC

## 2018-01-03 NOTE — Progress Notes (Signed)
   Subjective:    Patient ID: Joanna [name], female    DOB: [birth year], 67 y.o.   MRN: [id number]  HPI  Patient is here today with complaints of a UTI.She states she has some burning when she is urinating on going since this past Monday. She has not taken anything for this. Dysuria urinary frequency symptoms over the past several days denies vomiting diarrhea sweats chills Review of Systems  Constitutional: Negative for activity change and fever.  HENT: Negative for congestion, ear pain and rhinorrhea.   Eyes: Negative for discharge.  Respiratory: Negative for cough, shortness of breath and wheezing.   Cardiovascular: Negative for chest pain.  Genitourinary: Positive for dysuria and frequency. Negative for flank pain and hematuria.       Results for orders placed or performed in visit on 01/03/18  POCT urinalysis dipstick  Result Value Ref Range   Color, UA     Clarity, UA     Glucose, UA     Bilirubin, UA     Ketones, UA     Spec Grav, UA 1.020 1.010 - 1.025   Blood, UA Negative    pH, UA 5.0 5.0 - 8.0   Protein, UA trace    Urobilinogen, UA  0.2 or 1.0 E.U./dL   Nitrite, UA     Leukocytes, UA Negative Negative   Appearance     Odor      Objective:   Physical Exam  Constitutional: She appears well-developed.  HENT:  Head: Normocephalic.  Right Ear: External ear normal.  Left Ear: External ear normal.  Nose: Nose normal.  Mouth/Throat: Oropharynx is clear and moist. No oropharyngeal exudate.  Eyes: Right eye exhibits no discharge. Left eye exhibits no discharge.  Neck: Neck supple. No tracheal deviation present.  Cardiovascular: Normal rate and normal heart sounds.  No murmur heard. Pulmonary/Chest: Effort normal and breath sounds normal. She has no wheezes. She has no rales.  Abdominal: Soft. She exhibits no distension. There is no tenderness. There is no rebound.  Lymphadenopathy:    She has no cervical adenopathy.  Skin: Skin is warm and dry.  Nursing note and  vitals reviewed.         Assessment & Plan:  Probable UTI antibiotic prescribed Culture sent If progressive troubles or worse follow-up

## 2018-01-03 NOTE — Patient Instructions (Signed)

## 2018-01-04 LAB — LIPID PANEL
Chol/HDL Ratio: 2.8 ratio (ref 0.0–4.4)
Cholesterol, Total: 147 mg/dL (ref 100–199)
HDL: 53 mg/dL (ref >39)
LDL Calculated: 72 mg/dL (ref 0–99)
Triglycerides: 108 mg/dL (ref 0–149)
VLDL Cholesterol Cal: 22 mg/dL (ref 5–40)

## 2018-01-04 LAB — BASIC METABOLIC PANEL
BUN/Creatinine Ratio: 12 (ref 12–28)
BUN: 19 mg/dL (ref 8–27)
CO2: 25 mmol/L (ref 20–29)
Calcium: 10.4 mg/dL — ABNORMAL HIGH (ref 8.7–10.3)
Chloride: 103 mmol/L (ref 96–106)
Creatinine, Ser: 1.61 mg/dL — ABNORMAL HIGH (ref 0.57–1.00)
GFR calc Af Amer: 38 mL/min/{1.73_m2} — ABNORMAL LOW (ref >59)
GFR calc non Af Amer: 33 mL/min/{1.73_m2} — ABNORMAL LOW (ref >59)
Glucose: 104 mg/dL — ABNORMAL HIGH (ref 65–99)
Potassium: 3.7 mmol/L (ref 3.5–5.2)
Sodium: 149 mmol/L — ABNORMAL HIGH (ref 134–144)

## 2018-01-04 LAB — HEPATIC FUNCTION PANEL
ALT: 15 IU/L (ref 0–32)
AST: 23 IU/L (ref 0–40)
Albumin: 3.9 g/dL (ref 3.6–4.8)
Alkaline Phosphatase: 87 IU/L (ref 39–117)
Bilirubin Total: 0.2 mg/dL (ref 0.0–1.2)
Bilirubin, Direct: 0.08 mg/dL (ref 0.00–0.40)
Total Protein: 6.6 g/dL (ref 6.0–8.5)

## 2018-01-04 LAB — HEMOGLOBIN A1C
Est. average glucose Bld gHb Est-mCnc: 143 mg/dL
Hgb A1c MFr Bld: 6.6 % — ABNORMAL HIGH (ref 4.8–5.6)

## 2018-01-04 LAB — TSH: TSH: 0.266 u[IU]/mL — ABNORMAL LOW (ref 0.450–4.500)

## 2018-01-06 LAB — URINE CULTURE

## 2018-01-06 LAB — SPECIMEN STATUS REPORT

## 2018-01-07 ENCOUNTER — Other Ambulatory Visit: Payer: Self-pay | Admitting: *Deleted

## 2018-01-07 ENCOUNTER — Other Ambulatory Visit (HOSPITAL_COMMUNITY): Payer: PRIVATE HEALTH INSURANCE

## 2018-01-07 DIAGNOSIS — E039 Hypothyroidism, unspecified: Secondary | ICD-10-CM

## 2018-01-07 DIAGNOSIS — Z79899 Other long term (current) drug therapy: Secondary | ICD-10-CM

## 2018-01-07 MED ORDER — LEVOTHYROXINE SODIUM 88 MCG PO TABS: ORAL_TABLET | ORAL | 1 refills | Status: DC

## 2018-01-07 MED ORDER — CIPROFLOXACIN HCL 250 MG PO TABS: ORAL_TABLET | ORAL | 0 refills | Status: DC

## 2018-01-07 NOTE — Addendum Note (Signed)
Addended by: Karle Barr on: 01/07/2018 09:02 AM   Modules accepted: Orders

## 2018-01-23 ENCOUNTER — Ambulatory Visit (INDEPENDENT_AMBULATORY_CARE_PROVIDER_SITE_OTHER): Payer: PPO | Admitting: Family Medicine

## 2018-01-23 ENCOUNTER — Encounter: Payer: Self-pay | Admitting: Family Medicine

## 2018-01-23 VITALS — Temp 99.0°F | Ht 62.0 in | Wt 168.0 lb

## 2018-01-23 DIAGNOSIS — R062 Wheezing: Secondary | ICD-10-CM

## 2018-01-23 DIAGNOSIS — R05 Cough: Secondary | ICD-10-CM

## 2018-01-23 MED ORDER — ALBUTEROL SULFATE (2.5 MG/3ML) 0.083% IN NEBU: 2.5000 mg | INHALATION_SOLUTION | Freq: Every day | RESPIRATORY_TRACT | Status: DC

## 2018-01-23 MED ORDER — ALBUTEROL SULFATE (2.5 MG/3ML) 0.083% IN NEBU
2.5000 mg | INHALATION_SOLUTION | Freq: Once | RESPIRATORY_TRACT | Status: AC
  Administered 2018-01-23: 2.5 mg via RESPIRATORY_TRACT

## 2018-01-23 MED ORDER — AZITHROMYCIN 250 MG PO TABS: ORAL_TABLET | ORAL | 0 refills | Status: DC

## 2018-01-23 MED ORDER — ALBUTEROL SULFATE HFA 108 (90 BASE) MCG/ACT IN AERS: 2.0000 | INHALATION_SPRAY | RESPIRATORY_TRACT | 5 refills | Status: DC | PRN

## 2018-01-23 NOTE — Progress Notes (Signed)
   Subjective:    Patient ID: Joanna [name], female    DOB: 02/08/1951, 67 y.o.   MRN: [id number]  Cough  This is a new problem. The current episode started in the past 7 days. Associated symptoms include chills, a fever, nasal congestion, rhinorrhea and wheezing. Pertinent negatives include no chest pain, ear pain or shortness of breath.   Patient with significant coughing congestion wheezing feels short of breath in addition to this allergy issues.  No nausea vomiting diarrhea   Review of Systems  Constitutional: Positive for chills, fatigue and fever. Negative for activity change.  HENT: Positive for congestion and rhinorrhea. Negative for ear pain.   Eyes: Negative for discharge.  Respiratory: Positive for cough and wheezing. Negative for shortness of breath.   Cardiovascular: Negative for chest pain.       Objective:   Physical Exam  Constitutional: She appears well-developed.  HENT:  Head: Normocephalic.  Right Ear: External ear normal.  Left Ear: External ear normal.  Nose: Nose normal.  Mouth/Throat: Oropharynx is clear and moist. No oropharyngeal exudate.  Eyes: Right eye exhibits no discharge. Left eye exhibits no discharge.  Neck: Neck supple. No tracheal deviation present.  Cardiovascular: Normal rate and normal heart sounds.  No murmur heard. Pulmonary/Chest: Effort normal and breath sounds normal. She has no wheezes. She has no rales.  Lymphadenopathy:    She has no cervical adenopathy.  Skin: Skin is warm and dry.  Nursing note and vitals reviewed.   Neb treatment given moderate improvement O2 sat 100%     Assessment & Plan:  Flulike illness Secondary sinusitis Antibiotics prescribed use albuterol. Prednisone taper if necessary but for now hold off on this patient is to give [place] update in the next 48 hours how she is doing

## 2018-01-25 ENCOUNTER — Other Ambulatory Visit: Payer: Self-pay | Admitting: Nurse Practitioner

## 2018-01-25 ENCOUNTER — Telehealth: Payer: Self-pay | Admitting: Family Medicine

## 2018-01-25 MED ORDER — AMOXICILLIN-POT CLAVULANATE 875-125 MG PO TABS: 1.0000 | ORAL_TABLET | Freq: Two times a day (BID) | ORAL | 0 refills | Status: DC

## 2018-01-25 NOTE — Telephone Encounter (Signed)
Sent in another antibiotic. Did she start Prednisone?

## 2018-01-25 NOTE — Telephone Encounter (Signed)
Patient was told from last visit 4/10 to call back if not better by 4/12. She states still has congestion ,bad cough.[place]

## 2018-01-25 NOTE — Telephone Encounter (Signed)
Patient stated that doctor wanted to avoid prednisone is she started feeling better but patient states she doesn't feel like she is getting better. Patein would need a prescription for prednisone sent in and would also like diflucan sent in as well.

## 2018-01-26 ENCOUNTER — Other Ambulatory Visit: Payer: Self-pay | Admitting: Nurse Practitioner

## 2018-01-26 ENCOUNTER — Encounter: Payer: Self-pay | Admitting: Nurse Practitioner

## 2018-01-26 MED ORDER — PREDNISONE 20 MG PO TABS: ORAL_TABLET | ORAL | 0 refills | Status: DC

## 2018-01-26 MED ORDER — FLUCONAZOLE 150 MG PO TABS
ORAL_TABLET | ORAL | 0 refills | Status: DC
Start: 2018-01-26 — End: 2018-05-16

## 2018-01-26 NOTE — Telephone Encounter (Signed)
My chart message and Rx sent.

## 2018-01-31 ENCOUNTER — Ambulatory Visit: Payer: PPO | Admitting: Family Medicine

## 2018-02-01 DIAGNOSIS — G4733 Obstructive sleep apnea (adult) (pediatric): Secondary | ICD-10-CM | POA: Diagnosis not present

## 2018-02-18 ENCOUNTER — Other Ambulatory Visit: Payer: Self-pay | Admitting: Family Medicine

## 2018-02-18 DIAGNOSIS — Z1231 Encounter for screening mammogram for malignant neoplasm of breast: Secondary | ICD-10-CM

## 2018-02-22 ENCOUNTER — Ambulatory Visit (HOSPITAL_COMMUNITY)
Admission: RE | Admit: 2018-02-22 | Discharge: 2018-02-22 | Disposition: A | Discharge status: Home / Self Care | Payer: PPO | Source: Ambulatory Visit | Attending: Family Medicine | Admitting: Family Medicine

## 2018-02-22 ENCOUNTER — Encounter (HOSPITAL_COMMUNITY): Payer: Self-pay

## 2018-02-22 DIAGNOSIS — R928 Other abnormal and inconclusive findings on diagnostic imaging of breast: Secondary | ICD-10-CM | POA: Diagnosis not present

## 2018-02-22 DIAGNOSIS — G4733 Obstructive sleep apnea (adult) (pediatric): Secondary | ICD-10-CM | POA: Diagnosis not present

## 2018-02-22 DIAGNOSIS — Z1231 Encounter for screening mammogram for malignant neoplasm of breast: Secondary | ICD-10-CM | POA: Insufficient documentation

## 2018-02-25 ENCOUNTER — Other Ambulatory Visit: Payer: Self-pay | Admitting: Family Medicine

## 2018-02-25 DIAGNOSIS — R928 Other abnormal and inconclusive findings on diagnostic imaging of breast: Secondary | ICD-10-CM

## 2018-03-03 DIAGNOSIS — G4733 Obstructive sleep apnea (adult) (pediatric): Secondary | ICD-10-CM | POA: Diagnosis not present

## 2018-03-05 ENCOUNTER — Ambulatory Visit (HOSPITAL_COMMUNITY)
Admission: RE | Admit: 2018-03-05 | Discharge: 2018-03-05 | Disposition: A | Discharge status: Home / Self Care | Payer: PPO | Source: Ambulatory Visit | Attending: Family Medicine | Admitting: Family Medicine

## 2018-03-05 DIAGNOSIS — R928 Other abnormal and inconclusive findings on diagnostic imaging of breast: Secondary | ICD-10-CM | POA: Insufficient documentation

## 2018-03-06 ENCOUNTER — Other Ambulatory Visit: Payer: Self-pay

## 2018-03-06 ENCOUNTER — Encounter: Payer: Self-pay | Admitting: Family Medicine

## 2018-03-06 ENCOUNTER — Ambulatory Visit (INDEPENDENT_AMBULATORY_CARE_PROVIDER_SITE_OTHER): Payer: PPO | Admitting: Family Medicine

## 2018-03-06 VITALS — BP 142/90 | Temp 98.8°F | Ht 62.0 in | Wt 168.0 lb

## 2018-03-06 DIAGNOSIS — L989 Disorder of the skin and subcutaneous tissue, unspecified: Secondary | ICD-10-CM

## 2018-03-06 DIAGNOSIS — J019 Acute sinusitis, unspecified: Secondary | ICD-10-CM | POA: Diagnosis not present

## 2018-03-06 DIAGNOSIS — D1724 Benign lipomatous neoplasm of skin and subcutaneous tissue of left leg: Secondary | ICD-10-CM | POA: Diagnosis not present

## 2018-03-06 MED ORDER — CEFPROZIL 250 MG PO TABS: 250.0000 mg | ORAL_TABLET | Freq: Two times a day (BID) | ORAL | 0 refills | Status: DC

## 2018-03-06 NOTE — Progress Notes (Signed)
   Subjective:    Patient ID: Joanna [name], female    DOB: Feb 20, 1951, 67 y.o.   MRN: [id number]  HPIpt arrives for a skin tag removal on left upper thigh.  Warm skin On the left thigh region is present years.  Seems to be getting somewhat better.  Not bleeding.  It is very patient may need for her to have this she is asked asking for it to be removed  Finished antibiotic. Felt a little better but symptoms starting back. Cough and congestion.  Brings up phlegm intermittently denies shortness of breath denies high fever chills sweats    Review of Systems Please see above    Objective:   Physical Exam  Lungs clear respiratory rate normal HEENT is benign throat nonerythematous Resolved sinusitis and prescribed warnings discussed follow-up if problems  Large pedunculated skin tag on the left side with a broad base of the procedure was explained to the patient the patient did consent.  The area was cleansed with alcohol.  Injected with lidocaine with no complications.  Elliptical excision was used this area 3 sutures were placed of 5-0 Ethilon but bleeding was controlled no complications      Assessment & Plan:  Send it to pathology Sutures out in 9 to 10 days Warning signs for infections discussed  Persistent sinusitis antibiotics prescribed warning signs discussed

## 2018-03-15 ENCOUNTER — Encounter: Payer: Self-pay | Admitting: Family Medicine

## 2018-03-15 ENCOUNTER — Ambulatory Visit (INDEPENDENT_AMBULATORY_CARE_PROVIDER_SITE_OTHER): Payer: PPO | Admitting: Family Medicine

## 2018-03-15 VITALS — BP 132/88 | Temp 98.4°F | Ht 62.0 in | Wt 168.0 lb

## 2018-03-15 DIAGNOSIS — E119 Type 2 diabetes mellitus without complications: Secondary | ICD-10-CM

## 2018-03-15 DIAGNOSIS — J302 Other seasonal allergic rhinitis: Secondary | ICD-10-CM

## 2018-03-15 DIAGNOSIS — E785 Hyperlipidemia, unspecified: Secondary | ICD-10-CM | POA: Diagnosis not present

## 2018-03-15 DIAGNOSIS — E890 Postprocedural hypothyroidism: Secondary | ICD-10-CM | POA: Diagnosis not present

## 2018-03-15 MED ORDER — AMOXICILLIN-POT CLAVULANATE 875-125 MG PO TABS: 1.0000 | ORAL_TABLET | Freq: Two times a day (BID) | ORAL | 0 refills | Status: AC

## 2018-03-15 MED ORDER — INDAPAMIDE 2.5 MG PO TABS: 2.5000 mg | ORAL_TABLET | Freq: Every morning | ORAL | 3 refills | Status: DC

## 2018-03-15 MED ORDER — INDAPAMIDE 2.5 MG PO TABS: 2.5000 mg | ORAL_TABLET | Freq: Every morning | ORAL | 1 refills | Status: DC

## 2018-03-15 MED ORDER — LIRAGLUTIDE 18 MG/3ML ~~LOC~~ SOPN: PEN_INJECTOR | SUBCUTANEOUS | 1 refills | Status: DC

## 2018-03-15 MED ORDER — AZELASTINE HCL 0.1 % NA SOLN: 1.0000 | Freq: Two times a day (BID) | NASAL | 3 refills | Status: DC

## 2018-03-15 NOTE — Progress Notes (Signed)
   Subjective:    Patient ID: Joanna [name], female    DOB: [birth year], 67 y.o.   MRN: [id number]   Suture / Staple Removal   sutures placed on may 22nd on left upper thigh.  The pain in the she is here today because of suture removal from where she had lesion removed.  This came back benign.  I reviewed over the pathology with her. Needs refill on indapamide and victoza.  Patient also relates in addition to needing refills head congestion drainage coughing sinus congestion allergy issues.  No wheezing or difficulty breathing   Review of Systems  Constitutional: Negative for activity change and fever.  HENT: Positive for congestion and rhinorrhea. Negative for ear pain.   Eyes: Negative for discharge.  Respiratory: Positive for cough. Negative for shortness of breath and wheezing.   Cardiovascular: Negative for chest pain.       Objective:   Physical Exam  Constitutional: She appears well-developed.  HENT:  Head: Normocephalic.  Nose: Nose normal.  Mouth/Throat: Oropharynx is clear and moist. No oropharyngeal exudate.  Neck: Neck supple.  Cardiovascular: Normal rate and normal heart sounds.  No murmur heard. Pulmonary/Chest: Effort normal and breath sounds normal. She has no wheezes.  Lymphadenopathy:    She has no cervical adenopathy.  Skin: Skin is warm and dry.  Nursing note and vitals reviewed.     Where the sutures were placed looks very good no significant issues no sign of any infection    Assessment & Plan:  Suture removal without difficulty no sign of any infection warning signs were discussed in detail  Allergic rhinitis use allergy medicine add Astelin  Mild sinus infection but doing better now but patient will be going out of town in a few weeks I recommend for her to go ahead and have a prescription of antibiotics just in case  Follow-up in several months patient to do lab work before her follow-up visit

## 2018-03-16 LAB — LIPID PANEL
Chol/HDL Ratio: 2.5 ratio (ref 0.0–4.4)
Cholesterol, Total: 166 mg/dL (ref 100–199)
HDL: 67 mg/dL (ref >39)
LDL Calculated: 74 mg/dL (ref 0–99)
Triglycerides: 123 mg/dL (ref 0–149)
VLDL Cholesterol Cal: 25 mg/dL (ref 5–40)

## 2018-03-16 LAB — HEPATIC FUNCTION PANEL
ALT: 15 IU/L (ref 0–32)
AST: 15 IU/L (ref 0–40)
Albumin: 4.1 g/dL (ref 3.6–4.8)
Alkaline Phosphatase: 87 IU/L (ref 39–117)
Bilirubin Total: <0.2 mg/dL (ref 0.0–1.2)
Bilirubin, Direct: 0.07 mg/dL (ref 0.00–0.40)
Total Protein: 6.5 g/dL (ref 6.0–8.5)

## 2018-03-16 LAB — MICROALBUMIN / CREATININE URINE RATIO
Creatinine, Urine: 143.8 mg/dL
Microalb/Creat Ratio: 737.4 mg/g creat — ABNORMAL HIGH (ref 0.0–30.0)
Microalbumin, Urine: 1060.4 ug/mL

## 2018-03-16 LAB — HEMOGLOBIN A1C
Est. average glucose Bld gHb Est-mCnc: 143 mg/dL
Hgb A1c MFr Bld: 6.6 % — ABNORMAL HIGH (ref 4.8–5.6)

## 2018-03-16 LAB — TSH: TSH: 2.53 u[IU]/mL (ref 0.450–4.500)

## 2018-03-17 ENCOUNTER — Encounter: Payer: Self-pay | Admitting: Family Medicine

## 2018-03-20 LAB — HM DIABETES EYE EXAM

## 2018-03-29 ENCOUNTER — Encounter: Payer: Self-pay | Admitting: *Deleted

## 2018-04-03 DIAGNOSIS — G4733 Obstructive sleep apnea (adult) (pediatric): Secondary | ICD-10-CM | POA: Diagnosis not present

## 2018-04-14 ENCOUNTER — Other Ambulatory Visit: Payer: Self-pay | Admitting: Family Medicine

## 2018-04-17 ENCOUNTER — Telehealth: Payer: Self-pay | Admitting: Family Medicine

## 2018-04-17 ENCOUNTER — Other Ambulatory Visit: Payer: Self-pay

## 2018-04-17 MED ORDER — AZELASTINE HCL 0.1 % NA SOLN: 1.0000 | Freq: Two times a day (BID) | NASAL | 3 refills | Status: DC

## 2018-04-17 NOTE — Telephone Encounter (Signed)
Left detailed message on Kristys voicemail and updated epic list

## 2018-04-17 NOTE — Telephone Encounter (Signed)
Marita Kansas from [place] contacted office to see if patient could have 90 day supply of Azelastine 0.1% nasal spray. Place one spray into both nostril two times daily. Please advise. Thanks

## 2018-04-17 NOTE — Telephone Encounter (Signed)
90-day supply 3 refills

## 2018-04-18 ENCOUNTER — Encounter: Payer: Self-pay | Admitting: Family Medicine

## 2018-04-19 ENCOUNTER — Telehealth: Payer: Self-pay | Admitting: Family Medicine

## 2018-04-19 NOTE — Telephone Encounter (Signed)
Patient stated she is out of state for her grandson's heart surgery and will not return till 04/24/18/ Patient scheduled appointment for 04/24/18 and agreed to go to ER in the state she is in if she has any further issues.

## 2018-04-19 NOTE — Telephone Encounter (Signed)
Pt sent a MyChart message requesting an appointment 04/29/18 or later for being overly tired & passing out  Please advise so I may call pt (not sure how serious this could be)  [phone]

## 2018-04-19 NOTE — Telephone Encounter (Signed)
Left message to return call to get more information 

## 2018-04-19 NOTE — Telephone Encounter (Signed)
good

## 2018-04-20 ENCOUNTER — Other Ambulatory Visit: Payer: Self-pay | Admitting: Family Medicine

## 2018-04-24 ENCOUNTER — Encounter: Payer: Self-pay | Admitting: Nurse Practitioner

## 2018-04-24 ENCOUNTER — Ambulatory Visit (INDEPENDENT_AMBULATORY_CARE_PROVIDER_SITE_OTHER): Payer: PPO | Admitting: Nurse Practitioner

## 2018-04-24 VITALS — BP 128/82 | HR 83 | Ht 62.0 in | Wt 168.0 lb

## 2018-04-24 DIAGNOSIS — R55 Syncope and collapse: Secondary | ICD-10-CM

## 2018-04-27 ENCOUNTER — Encounter: Payer: Self-pay | Admitting: Nurse Practitioner

## 2018-04-27 NOTE — Progress Notes (Signed)
Subjective:  Presents to discuss an episode that occurred while out of town. She was with her grandson who just has heart surgery. Just came back into town today. Everything was going well. Went out to eat with family. Had a margarita. Had not had much sleep. Sat down while talking to family and either fell asleep or passed out. Described as very brief. No seizure activity. Did not check BS at the time; FBS next day was 70. Was tired afterwards and went to bed. Refused to go to ED at the time since she felt fine. Has not had any similar incidents before or since. No visual changes.  No difficulty speaking or swallowing.  No unusual numbness or weakness of the face arms or legs.  No chest pain/ischemic type pain shortness of breath or palpitations.  Had a recent eye exam which was normal.  Adherent to medication regimen.  No fever or recent illness.  No headaches.  Objective:   BP 128/82   Pulse 83   Ht 5\' 2"  (1.575 m)   Wt 168 lb (76.2 kg)   SpO2 99%   BMI 30.73 kg/m  NAD.  Alert, oriented.  TMs mild clear effusion, no erythema.  Funduscopic exam normal.  Pupils equal and reactive to light.  EOMs intact without nystagmus. Point to point localization. Hand strength 5+ bilat. Reflexes normal. Lungs clear. Heart RRR. Carotids no bruits or thrills. LE: no edema. Gait normal. Romberg neg.   Assessment:  Vasovagal episode    Plan:  With normal exam and no warning signs, episode was most likely related to stress and exhaustion.  Warning signs reviewed. Call or go to ED if symptoms recur or new ones develop.

## 2018-05-02 ENCOUNTER — Other Ambulatory Visit: Payer: Self-pay | Admitting: Family Medicine

## 2018-05-03 DIAGNOSIS — G4733 Obstructive sleep apnea (adult) (pediatric): Secondary | ICD-10-CM | POA: Diagnosis not present

## 2018-05-08 DIAGNOSIS — G4733 Obstructive sleep apnea (adult) (pediatric): Secondary | ICD-10-CM | POA: Diagnosis not present

## 2018-05-16 ENCOUNTER — Ambulatory Visit (INDEPENDENT_AMBULATORY_CARE_PROVIDER_SITE_OTHER): Payer: PPO | Admitting: Family Medicine

## 2018-05-16 VITALS — BP 126/82 | Ht 62.0 in | Wt 168.8 lb

## 2018-05-16 DIAGNOSIS — R519 Headache, unspecified: Secondary | ICD-10-CM

## 2018-05-16 DIAGNOSIS — R51 Headache: Secondary | ICD-10-CM | POA: Diagnosis not present

## 2018-05-16 DIAGNOSIS — D509 Iron deficiency anemia, unspecified: Secondary | ICD-10-CM

## 2018-05-16 MED ORDER — TOPIRAMATE 50 MG PO TABS: 50.0000 mg | ORAL_TABLET | Freq: Two times a day (BID) | ORAL | 3 refills | Status: DC

## 2018-05-16 NOTE — Progress Notes (Signed)
Headache logs mailed to patient. Called pt to notify, left message to return call

## 2018-05-16 NOTE — Patient Instructions (Signed)
topamax Start with 1 each evening for the first 5 days Then 1 twice a day  Recheck in 6 weeks

## 2018-05-16 NOTE — Progress Notes (Signed)
Pt returned call. Pt states she is out and will come by office and pick these up.

## 2018-05-16 NOTE — Progress Notes (Signed)
   Subjective:    Patient ID: Joanna [name], female    DOB: 11/07/1950, 67 y.o.   MRN: [id number]  Headache   The current episode started 1 to 4 weeks ago. The problem occurs daily. The problem has been gradually worsening. The pain is located in the occipital region. The pain does not radiate. The quality of the pain is described as aching. The pain is at a severity of 10/10. The pain is severe. Associated symptoms include nausea. Pertinent negatives include no abdominal pain, blurred vision, coughing, dizziness, numbness, rhinorrhea, vomiting or weakness. The symptoms are aggravated by activity. Treatments tried: BC powder. The treatment provided mild relief. There is no history of cancer.    Patient arrives with headaches for 2 weeks. Patient states that [facility] powders help with the headache but it comes back when the med wears off.  Review of Systems  Constitutional: Negative for activity change, appetite change and fatigue.  HENT: Negative for congestion and rhinorrhea.   Eyes: Negative for blurred vision.  Respiratory: Negative for cough and shortness of breath.   Cardiovascular: Negative for chest pain and leg swelling.  Gastrointestinal: Positive for nausea. Negative for abdominal pain, diarrhea and vomiting.  Endocrine: Negative for polydipsia and polyphagia.  Skin: Negative for color change.  Neurological: Positive for headaches. Negative for dizziness, weakness and numbness.  Psychiatric/Behavioral: Negative for behavioral problems and confusion.       Objective:   Physical Exam  Constitutional: She appears well-nourished. No distress.  HENT:  Head: Normocephalic and atraumatic.  Eyes: Right eye exhibits no discharge. Left eye exhibits no discharge.  Neck: No tracheal deviation present.  Cardiovascular: Normal rate, regular rhythm and normal heart sounds.  No murmur heard. Pulmonary/Chest: Effort normal and breath sounds normal. No respiratory distress.  Musculoskeletal: She  exhibits no edema.  Lymphadenopathy:    She has no cervical adenopathy.  Neurological: She is alert. Coordination normal.  Skin: Skin is warm and dry.  Psychiatric: She has a normal mood and affect. Her behavior is normal.  Vitals reviewed.   Patient feeling cold all the time fatigue tiredness we will check for iron deficiency she has had this before      Assessment & Plan:  Severe occipital headache Anti-inflammatories should be avoided May use pain medicine when necessary Tylenol when necessary Start Topamax twice daily start off 1/day after 5 days go to twice per day If persistent problems or if worse may need appointment with specialist/neurologist Follow-up in 6 weeks

## 2018-05-17 LAB — FERRITIN: Ferritin: 14 ng/mL — ABNORMAL LOW (ref 15–150)

## 2018-05-17 LAB — CBC WITH DIFFERENTIAL/PLATELET
Basophils Absolute: 0.1 10*3/uL (ref 0.0–0.2)
Basos: 2 %
EOS (ABSOLUTE): 0.2 10*3/uL (ref 0.0–0.4)
Eos: 4 %
Hematocrit: 33.3 % — ABNORMAL LOW (ref 34.0–46.6)
Hemoglobin: 10.8 g/dL — ABNORMAL LOW (ref 11.1–15.9)
Immature Grans (Abs): 0 10*3/uL (ref 0.0–0.1)
Immature Granulocytes: 0 %
Lymphocytes Absolute: 2.1 10*3/uL (ref 0.7–3.1)
Lymphs: 40 %
MCH: 26.7 pg (ref 26.6–33.0)
MCHC: 32.4 g/dL (ref 31.5–35.7)
MCV: 82 fL (ref 79–97)
Monocytes Absolute: 0.4 10*3/uL (ref 0.1–0.9)
Monocytes: 7 %
Neutrophils Absolute: 2.6 10*3/uL (ref 1.4–7.0)
Neutrophils: 47 %
Platelets: 370 10*3/uL (ref 150–450)
RBC: 4.05 x10E6/uL (ref 3.77–5.28)
RDW: 15.7 % — ABNORMAL HIGH (ref 12.3–15.4)
WBC: 5.3 10*3/uL (ref 3.4–10.8)

## 2018-05-21 ENCOUNTER — Other Ambulatory Visit: Payer: Self-pay | Admitting: *Deleted

## 2018-05-21 DIAGNOSIS — D649 Anemia, unspecified: Secondary | ICD-10-CM

## 2018-05-21 DIAGNOSIS — R790 Abnormal level of blood mineral: Secondary | ICD-10-CM

## 2018-05-23 ENCOUNTER — Encounter: Payer: Self-pay | Admitting: Gastroenterology

## 2018-05-29 ENCOUNTER — Other Ambulatory Visit: Payer: Self-pay | Admitting: Family Medicine

## 2018-05-31 ENCOUNTER — Emergency Department (HOSPITAL_COMMUNITY)
Admission: EM | Admit: 2018-05-31 | Discharge: 2018-05-31 | Disposition: A | Discharge status: Home / Self Care | Payer: PPO | Attending: Emergency Medicine | Admitting: Emergency Medicine

## 2018-05-31 ENCOUNTER — Encounter (HOSPITAL_COMMUNITY): Payer: Self-pay

## 2018-05-31 DIAGNOSIS — Z87891 Personal history of nicotine dependence: Secondary | ICD-10-CM | POA: Diagnosis not present

## 2018-05-31 DIAGNOSIS — E119 Type 2 diabetes mellitus without complications: Secondary | ICD-10-CM | POA: Insufficient documentation

## 2018-05-31 DIAGNOSIS — Z794 Long term (current) use of insulin: Secondary | ICD-10-CM | POA: Diagnosis not present

## 2018-05-31 DIAGNOSIS — Z79899 Other long term (current) drug therapy: Secondary | ICD-10-CM | POA: Diagnosis not present

## 2018-05-31 DIAGNOSIS — I10 Essential (primary) hypertension: Secondary | ICD-10-CM | POA: Insufficient documentation

## 2018-05-31 DIAGNOSIS — T63481A Toxic effect of venom of other arthropod, accidental (unintentional), initial encounter: Secondary | ICD-10-CM | POA: Diagnosis not present

## 2018-05-31 DIAGNOSIS — T63441A Toxic effect of venom of bees, accidental (unintentional), initial encounter: Secondary | ICD-10-CM | POA: Diagnosis not present

## 2018-05-31 NOTE — ED Provider Notes (Signed)
[facility] EMERGENCY DEPARTMENT Provider Note   CSN: [id number] Arrival date & time: 05/31/18  2154     History   Chief Complaint Chief Complaint  Patient presents with  . Insect Bite    HPI Joanna [name] is a 67 y.o. female.  Patient stung on left lower leg yesterday by a yellow jacket.  She states she thinks she is "allergic" to stings. She had a fleeting episode of shortness of breath after the sting, but did not develop wheezing, tongue swelling, difficulty swallowing, or hives. She states she had an episode of nausea with emesis earlier today, now resolved. No abdominal pain.  She has a small area of localized redness at site of sting, no surrounding erythema or increase in warmth of skin.  The history is provided by the patient. No language interpreter was used.    Past Medical History:  Diagnosis Date  . 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 Depression   . Diabetes mellitus   . Diverticulosis   . Dyslipidemia   . GERD (gastroesophageal reflux disease)   . Hypertension   . Insomnia   . Microproteinuria   . Renal insufficiency 2010  . Right carpal tunnel syndrome 12/24/2017  . Sleep apnea   . Sleep apnea 12/25/2017   Abnormal sleep study February 2019 CPAP ordered  . Thyroid disease    hypothyroid    Patient Active Problem List   Diagnosis Date Noted  . Sleep apnea 12/25/2017  . Right carpal tunnel syndrome 12/24/2017  . Chronic anxiety 02/13/2017  . Right rotator cuff tendinitis 02/11/2015  . Iron deficiency 08/25/2014  . Microcytic anemia 06/17/2014  . Insomnia 05/04/2014  . Hypothyroidism 06/05/2013  . Renal insufficiency 06/05/2013  . Essential hypertension, benign 06/05/2013  . Stress fracture of right foot 02/27/2013  . Chronic radicular lumbar pain 02/27/2013  . Hyperlipidemia 01/16/2013  . Type 2 diabetes mellitus with hemoglobin A1c goal of less than 7.5% ([place]) 01/16/2013    Past Surgical History:    Procedure Laterality Date  . ABDOMINAL HYSTERECTOMY  1984  . BACK SURGERY    . BILATERAL OOPHORECTOMY    . COLONOSCOPY  01/29/12   Fields-2-3 hyperplastic polyps, mod internal hemorrhoids, diverticulosis throughout colon  . COLONOSCOPY N/A 07/03/2014   Procedure: COLONOSCOPY;  Surgeon: [name], MD;  Location: [facility] SUITE;  Service: Endoscopy;  Laterality: N/A;  230-moved to 8:30 - Ginger notified pt  . ESOPHAGOGASTRODUODENOSCOPY   01/29/2012   Fields-2-3cm sliding HH, fundic gland polyps, NEGATIVE H pylori  . ESOPHAGOGASTRODUODENOSCOPY N/A 07/03/2014   Procedure: ESOPHAGOGASTRODUODENOSCOPY (EGD);  Surgeon: [name], MD;  Location: [facility] SUITE;  Service: Endoscopy;  Laterality: N/A;  . GIVENS CAPSULE STUDY N/A 10/26/2014   SMALL BOWEL AVMs  . KNEE ARTHROSCOPY Right   . TOTAL ABDOMINAL HYSTERECTOMY W/ BILATERAL SALPINGOOPHORECTOMY       OB History   None      Home Medications    Prior to Admission medications   Medication Sig Start Date End Date Taking? Authorizing Provider  albuterol (PROVENTIL HFA;VENTOLIN HFA) 108 (90 Base) MCG/ACT inhaler Inhale 2 puffs into the lungs every 4 (four) hours as needed for wheezing or shortness of breath. 01/23/18   [name], MD  ALPRAZolam Duanne Moron) 0.5 MG tablet 1/2 to 1 po BID prn anxiety 08/21/17   [name], MD  amLODipine (NORVASC) 5 MG tablet Take 1 tablet (5 mg total) daily by mouth. 08/29/17  [name], MD  azelastine (ASTELIN) 0.1 % nasal spray Place 1 spray into both nostrils 2 (two) times daily. Use in each nostril as directed 04/17/18   [name], MD  colchicine 0.6 MG tablet 1 bid prn gout 08/21/17   [name], MD  fexofenadine (ALLEGRA) 180 MG tablet Take 180 mg by mouth daily.    [provider]  fluticasone (FLONASE) 50 MCG/ACT nasal spray PLACE 2 SPRAYS INTO THE NOSE AS NEEDED. 02/18/15   [name], MD  HYDROcodone-acetaminophen (NORCO) 10-325 MG tablet Take 1 tablet every 6  (six) hours as needed by mouth. 08/21/17   [name], MD  indapamide (LOZOL) 2.5 MG tablet TAKE 1 TABLET(2.5 MG) BY MOUTH EVERY MORNING 04/22/18   [name], MD  Insulin Pen Needle 32G X 4 MM MISC Use with Victoza as directed 12/11/17   [name], MD  levothyroxine (SYNTHROID, LEVOTHROID) 88 MCG tablet Take 1/2 tablet by mouth on Mondays and 1 tablet on all other days (change in directions) 05/29/18   Wolfgang Phoenix, [name], MD  liraglutide (VICTOZA) 18 MG/3ML SOPN INJECT 1.2 MG INTO THE SKIN DAILY 03/15/18   [name], MD  losartan (COZAAR) 100 MG tablet Take 1 tablet by mouth every day 05/02/18   [name], MD  Magnesium 250 MG TABS Take 250 mg by mouth daily.     [provider]  omeprazole (PRILOSEC) 40 MG capsule Take 1 capsule by mouth every day 04/15/18   [name], MD  pravastatin (PRAVACHOL) 40 MG tablet Take 1 tablet (40 mg total) daily by mouth. 08/21/17   [name], MD  topiramate (TOPAMAX) 50 MG tablet Take 1 tablet (50 mg total) by mouth 2 (two) times daily. 05/16/18   [name], MD  triamcinolone cream (KENALOG) 0.1 % Apply 1 application topically 2 (two) times daily. 04/24/17   [name], MD  zolpidem (AMBIEN) 5 MG tablet Take 1 tablet (5 mg total) at bedtime as needed by mouth for sleep. 08/21/17   [name], MD    Family History Family History  Problem Relation Age of Onset  . Cardiomyopathy Father   . Heart failure Mother   . Hyperlipidemia Mother   . Stroke Brother   . Cancer Brother        renal  . Kidney cancer Brother   . Diabetes Paternal Uncle   . Heart disease Paternal Grandmother     Social History Social History   Tobacco Use  . Smoking status: Former Smoker    Packs/day: 0.50    Years: 30.00    Pack years: 15.00    Types: Cigarettes    Last attempt to quit: 10/16/2010    Years since quitting: 7.6  . Smokeless tobacco: Former Systems developer    Quit date: 10/28/2010  Substance Use Topics  . Alcohol use: Yes     Comment: once per mo, socially  . Drug use: No     Allergies   Ivp dye [iodinated diagnostic agents] and Zoloft [sertraline hcl]   Review of Systems Review of Systems  Respiratory: Negative for chest tightness and wheezing.   Cardiovascular: Negative for chest pain.  Gastrointestinal: Negative for abdominal pain.  Skin: Negative for rash.  All other systems reviewed and are negative.    Physical Exam Updated Vital Signs BP (!) 134/93 (BP Location: Left Arm)   Pulse 79   Temp 98.3 F (36.8 C) (Oral)  Resp 17   Ht 5\' 2"  (1.575 m)   Wt 74.8 kg   SpO2 98%   BMI 30.18 kg/m   Physical Exam  Constitutional: She is oriented to person, place, and time. She appears well-developed and well-nourished. No distress.  HENT:  Head: Atraumatic.  Eyes: Conjunctivae are normal.  Neck: Neck supple.  Cardiovascular: Normal rate.  Pulmonary/Chest: Effort normal and breath sounds normal.  Abdominal: Soft. Bowel sounds are normal. She exhibits no distension. There is no tenderness.  Musculoskeletal: Normal range of motion. She exhibits no edema or deformity.  Neurological: She is alert and oriented to person, place, and time.  Skin: Skin is warm and dry. No rash noted. No erythema.  Psychiatric: She has a normal mood and affect.  Nursing note and vitals reviewed.    ED Treatments / Results  Labs (all labs ordered are listed, but only abnormal results are displayed) Labs Reviewed - No data to display  EKG None  Radiology No results found.  Procedures Procedures (including critical care time)  Medications Ordered in ED Medications - No data to display   Initial Impression / Assessment and Plan / ED Course  I have reviewed the triage vital signs and the nursing notes.  Pertinent labs & imaging results that were available during my care of the patient were reviewed by me and considered in my medical decision making (see chart for details).     Patient re-evaluated prior  to dc, is hemodynamically stable, in no respiratory distress, and denies the feeling of throat closing, normal phonation. No wheezing, no vomiting, no syncope. Discussed signs and symptoms of anaphylaxis and severe allergic reaction. Pt advised to return for any worsening in symptoms or any concerns. Pt is to follow up with their PCP. Pt is agreeable with plan & verbalizes understanding.   Final Clinical Impressions(s) / ED Diagnoses   Final diagnoses:  Insect stings, accidental or unintentional, initial encounter    ED Discharge Orders    None       Etta Quill, NP 05/31/18 2357    Davonna Belling, MD 06/01/18 0005

## 2018-05-31 NOTE — Discharge Instructions (Signed)
Please refer to attached instructions

## 2018-05-31 NOTE — ED Triage Notes (Signed)
Pt states she was stung by a yellow jacket yesterday to her left lower leg, states she has some "numbness' around the bite site and some mild redness and pain.

## 2018-06-03 DIAGNOSIS — G4733 Obstructive sleep apnea (adult) (pediatric): Secondary | ICD-10-CM | POA: Diagnosis not present

## 2018-06-05 ENCOUNTER — Telehealth: Payer: Self-pay | Admitting: Family Medicine

## 2018-06-05 NOTE — Telephone Encounter (Signed)
Patient went to dentist today and her blood pressure was 142/108 and she is schedule for surgery on Friday to remove two teeth. She wants to know if she should cancel it.

## 2018-06-05 NOTE — Telephone Encounter (Signed)
Discussed with patient. Patient scheduled follow up appointment with Dr Nicki Reaper to recheck her blood pressure.

## 2018-06-05 NOTE — Telephone Encounter (Signed)
The blood pressure when she came here was normal It is hard for me to verify a blood pressure that was done at the dentist office because I do not know their technique or machinery We would be happy to do a blood pressure check tomorrow morning if she is interested she could be set up for a visit with myself and if that blood pressure looks good she could still do her surgery and we would write a letter if that blood pressure was elevated we obviously would advise differently the patient should keep taking her medications as directed

## 2018-06-05 NOTE — Telephone Encounter (Signed)
Patient seen 05/16/18 and bp 126/82

## 2018-06-06 ENCOUNTER — Telehealth: Payer: Self-pay | Admitting: Family Medicine

## 2018-06-06 ENCOUNTER — Other Ambulatory Visit: Payer: Self-pay | Admitting: Family Medicine

## 2018-06-06 ENCOUNTER — Encounter: Payer: Self-pay | Admitting: Family Medicine

## 2018-06-06 ENCOUNTER — Ambulatory Visit (INDEPENDENT_AMBULATORY_CARE_PROVIDER_SITE_OTHER): Payer: PPO | Admitting: Family Medicine

## 2018-06-06 VITALS — BP 124/98 | Ht 62.0 in | Wt 164.0 lb

## 2018-06-06 DIAGNOSIS — I10 Essential (primary) hypertension: Secondary | ICD-10-CM

## 2018-06-06 MED ORDER — AMLODIPINE BESYLATE 5 MG PO TABS: 5.0000 mg | ORAL_TABLET | Freq: Every day | ORAL | 1 refills | Status: DC

## 2018-06-06 NOTE — Telephone Encounter (Signed)
Pt.notified

## 2018-06-06 NOTE — Progress Notes (Signed)
   Subjective:    Patient ID: Joanna [name], female    DOB: 04-26-1951, 67 y.o.   MRN: [id number]  HPI Patient is here today to follow up on her high bp.She states her Bp yesterday at the dentist (automatic bp machine) was 142/110.She has been having some headaches,no dizziness.She is taking Amlodipine 5 mg per chart one daily( she states she thinks it is 2.5 mg once daily) and losartan 100 mg one daily.She states she eats healthy and tries to restrict sodium intake.  Patient states that she is taking 2.5 mg amlodipine even though we have her on 5 mg to help her home pharmacy is shipping 2.5 to her she denies any chest tightness pressure pain but she does relate mild fatigue tiredness and elevated blood pressure Review of Systems  Constitutional: Negative for activity change, fatigue and fever.  HENT: Negative for congestion and rhinorrhea.   Respiratory: Negative for cough, chest tightness and shortness of breath.   Cardiovascular: Negative for chest pain and leg swelling.  Gastrointestinal: Negative for abdominal pain and nausea.  Skin: Negative for color change.  Neurological: Negative for dizziness and headaches.  Psychiatric/Behavioral: Negative for agitation and behavioral problems.       Objective:   Physical Exam  Constitutional: She appears well-nourished. No distress.  HENT:  Head: Normocephalic and atraumatic.  Eyes: Right eye exhibits no discharge. Left eye exhibits no discharge.  Neck: No tracheal deviation present.  Cardiovascular: Normal rate, regular rhythm and normal heart sounds.  No murmur heard. Pulmonary/Chest: Effort normal and breath sounds normal. No respiratory distress.  Musculoskeletal: She exhibits no edema.  Lymphadenopathy:    She has no cervical adenopathy.  Neurological: She is alert. Coordination normal.  Skin: Skin is warm and dry.  Psychiatric: She has a normal mood and affect. Her behavior is normal.  Vitals reviewed.   Blood pressure  132/88      Assessment & Plan:  Elevated blood pressure Amlodipine 5 mg a better dose Another prescription was sent in regarding this She is to monitor her blood pressure Then she may do her dental consultation Hold off on any type of surgery until blood pressure is more in the range of 130/80

## 2018-06-06 NOTE — Patient Instructions (Addendum)
Please verify your meds  Call us regarding amlodipine Is it 2.5? Or 5?  Call us [phone]  Also tell us the dentist name

## 2018-06-06 NOTE — Telephone Encounter (Signed)
This was sent [address]-thanks please notify patient

## 2018-06-06 NOTE — Telephone Encounter (Signed)
Pt states she's completely out of amLODipine (NORVASC) 5 MG tablet   Please send in Rx for 90 day supply to [facility]  Please call pt when done

## 2018-06-26 ENCOUNTER — Other Ambulatory Visit: Payer: Self-pay | Admitting: Family Medicine

## 2018-06-26 ENCOUNTER — Ambulatory Visit (INDEPENDENT_AMBULATORY_CARE_PROVIDER_SITE_OTHER): Payer: PPO | Admitting: Family Medicine

## 2018-06-26 ENCOUNTER — Encounter: Payer: Self-pay | Admitting: Family Medicine

## 2018-06-26 VITALS — BP 114/72 | Ht 62.0 in | Wt 160.0 lb

## 2018-06-26 DIAGNOSIS — M545 Low back pain, unspecified: Secondary | ICD-10-CM

## 2018-06-26 DIAGNOSIS — I10 Essential (primary) hypertension: Secondary | ICD-10-CM | POA: Diagnosis not present

## 2018-06-26 DIAGNOSIS — G5601 Carpal tunnel syndrome, right upper limb: Secondary | ICD-10-CM

## 2018-06-26 DIAGNOSIS — Z23 Encounter for immunization: Secondary | ICD-10-CM

## 2018-06-26 DIAGNOSIS — E119 Type 2 diabetes mellitus without complications: Secondary | ICD-10-CM

## 2018-06-26 DIAGNOSIS — G5622 Lesion of ulnar nerve, left upper limb: Secondary | ICD-10-CM

## 2018-06-26 MED ORDER — HYDROCODONE-ACETAMINOPHEN 10-325 MG PO TABS: 1.0000 | ORAL_TABLET | Freq: Four times a day (QID) | ORAL | 0 refills | Status: DC | PRN

## 2018-06-26 NOTE — Progress Notes (Signed)
Referral placed and pt notified

## 2018-06-26 NOTE — Progress Notes (Signed)
   Subjective:    Patient ID: Joanna [name], female    DOB: May 24, 1951, 67 y.o.   MRN: [id number]  Hypertension  This is a chronic problem. Pertinent negatives include no chest pain or shortness of breath. Compliance problems include exercise (takes meds every day, eats healthy, not able to exercise due to knee pain).    Was suppose to bring in headache diary but states she accidentally shredded the papers. Headaches are not as bad as they were. Stopped taking hydrocodone and now just takes tylenol. New headache diary forms given to pt.   Left hand numbness for over one year.  She also relates at times her healing looks clumsy she also states he gets weak at times she denies neck or shoulder pain.  She does states she is tried stretches exercises without much success  Diabetes under decent control with medication.  Watches diet  Chronic low back pain uses hydrocodone sparingly  Recently completed her dental surgery  Flu vaccine given today.     Review of Systems  Constitutional: Negative for activity change, appetite change and fatigue.  HENT: Negative for congestion and rhinorrhea.   Respiratory: Negative for cough and shortness of breath.   Cardiovascular: Negative for chest pain and leg swelling.  Gastrointestinal: Negative for abdominal pain and diarrhea.  Endocrine: Negative for polydipsia and polyphagia.  Skin: Negative for color change.  Neurological: Negative for dizziness and weakness.  Psychiatric/Behavioral: Negative for behavioral problems and confusion.       Objective:   Physical Exam  Constitutional: She appears well-nourished. No distress.  HENT:  Head: Normocephalic and atraumatic.  Eyes: Right eye exhibits no discharge. Left eye exhibits no discharge.  Neck: No tracheal deviation present.  Cardiovascular: Normal rate, regular rhythm and normal heart sounds.  No murmur heard. Pulmonary/Chest: Effort normal and breath sounds normal. No respiratory distress.    Musculoskeletal: She exhibits no edema.  Lymphadenopathy:    She has no cervical adenopathy.  Neurological: She is alert. Coordination normal.  Skin: Skin is warm and dry.  Psychiatric: She has a normal mood and affect. Her behavior is normal.  Vitals reviewed.   Comprehensive lab work with office visit in 6 months      Assessment & Plan:  HTN good control continue current measures.  Entrapment of the left ulnar nerve along with some hand weakness referral to hand specialist for further evaluation of work-up  Diabetes good control currently  Intermittent lumbar pain hydrocodone sparingly.

## 2018-07-04 DIAGNOSIS — G4733 Obstructive sleep apnea (adult) (pediatric): Secondary | ICD-10-CM | POA: Diagnosis not present

## 2018-07-05 DIAGNOSIS — G4733 Obstructive sleep apnea (adult) (pediatric): Secondary | ICD-10-CM | POA: Diagnosis not present

## 2018-07-10 DIAGNOSIS — R200 Anesthesia of skin: Secondary | ICD-10-CM | POA: Insufficient documentation

## 2018-07-10 DIAGNOSIS — R52 Pain, unspecified: Secondary | ICD-10-CM | POA: Diagnosis not present

## 2018-07-29 ENCOUNTER — Other Ambulatory Visit: Payer: Self-pay

## 2018-07-29 ENCOUNTER — Telehealth: Payer: Self-pay | Admitting: Family Medicine

## 2018-07-29 MED ORDER — PRAVASTATIN SODIUM 40 MG PO TABS: 40.0000 mg | ORAL_TABLET | Freq: Every day | ORAL | 1 refills | Status: DC

## 2018-07-29 MED ORDER — LOSARTAN POTASSIUM 100 MG PO TABS: 100.0000 mg | ORAL_TABLET | Freq: Every day | ORAL | 1 refills | Status: DC

## 2018-07-29 NOTE — Telephone Encounter (Signed)
Needs refills for:   losartan (COZAAR) 100 MG tablet   pravastatin (PRAVACHOL) 40 MG tablet    Pharmacy: [facility] [address] [place], [place] [address]   Last appt: 06/26/18 6 week follow up Next appt: 12/25/18 6 month follow up

## 2018-07-29 NOTE — Telephone Encounter (Signed)
Refills sent in and pt is aware °

## 2018-08-03 DIAGNOSIS — G4733 Obstructive sleep apnea (adult) (pediatric): Secondary | ICD-10-CM | POA: Diagnosis not present

## 2018-08-07 ENCOUNTER — Ambulatory Visit: Payer: PPO | Admitting: Gastroenterology

## 2018-08-16 DIAGNOSIS — R29898 Other symptoms and signs involving the musculoskeletal system: Secondary | ICD-10-CM | POA: Diagnosis not present

## 2018-08-16 DIAGNOSIS — R200 Anesthesia of skin: Secondary | ICD-10-CM | POA: Diagnosis not present

## 2018-08-19 ENCOUNTER — Ambulatory Visit (INDEPENDENT_AMBULATORY_CARE_PROVIDER_SITE_OTHER): Payer: PPO | Admitting: Family Medicine

## 2018-08-19 ENCOUNTER — Encounter: Payer: Self-pay | Admitting: Family Medicine

## 2018-08-19 VITALS — BP 120/82 | Ht 62.0 in | Wt 161.6 lb

## 2018-08-19 DIAGNOSIS — R51 Headache: Secondary | ICD-10-CM | POA: Diagnosis not present

## 2018-08-19 DIAGNOSIS — R519 Headache, unspecified: Secondary | ICD-10-CM

## 2018-08-19 MED ORDER — NAPROXEN 500 MG PO TABS: 500.0000 mg | ORAL_TABLET | Freq: Two times a day (BID) | ORAL | 0 refills | Status: DC

## 2018-08-19 MED ORDER — AMOXICILLIN 500 MG PO CAPS: 500.0000 mg | ORAL_CAPSULE | Freq: Three times a day (TID) | ORAL | 0 refills | Status: DC

## 2018-08-19 NOTE — Progress Notes (Signed)
   Subjective:    Patient ID: Joanna [name], female    DOB: 01-03-1951, 67 y.o.   MRN: [id number]  HPI  Patient arrives with a headache on right side of head for a week. Patient has tried hydrocodone and tylenol. Patient also reports a cough, ear pain and vomiting,  Woke up with it, painful lateral head and neck and shoulder  Went into a fullblown migraine per pt   Pt had bad headache and vomiting  Light bothered her eyes   right sided   Still fairly significant tho not as bad as ast week  Pt does smoke    Sticking with bp meds  Had not had a migrain for over twlelve yrs    hyderocodone did not help a lto  Review of Systems No headache, no major weight loss or weight gain, no chest pain no back pain abdominal pain no change in bowel habits complete ROS otherwise negative     Objective:   Physical Exam Alert and oriented, vitals reviewed and stable, NAD ENT-TM's and ext canals WNL slight nasal congestion otherwise normal bilat via otoscopic exam Soft palate, tonsils and post pharynx WNL via oropharyngeal exam Neck-symmetric, no masses; thyroid nonpalpable and nontender Pulmonary-no tachypnea or accessory muscle use; Clear without wheezes via auscultation Card--no abnrml murmurs, rhythm reg and rate WNL Carotid pulses symmetric, without bruits Right lateral neck/right suprascapular region positive tender to deep palpation Cerebellar function intact      Assessment & Plan:  Impression 1 probable musculoskeletal headache with potential element of sinusitis.  Pain worse on the right side with certain motions.  Has had some congestion.  Will cover with both an antibiotic and anti-inflammatory.  Patient does have more serious risk factors for more serious etiologies and headaches but at this time not experiencing any true red flags.  Warning signs discussed carefully.  Call if headaches persist after trial of medicine  Greater than 50% of this 25 minute face to face visit  was spent in counseling and discussion and coordination of care regarding the above diagnosis/diagnosies

## 2018-08-30 ENCOUNTER — Telehealth: Payer: Self-pay | Admitting: Family Medicine

## 2018-08-30 MED ORDER — CEFPROZIL 500 MG PO TABS: 500.0000 mg | ORAL_TABLET | Freq: Two times a day (BID) | ORAL | 0 refills | Status: DC

## 2018-08-30 NOTE — Telephone Encounter (Signed)
Prescription sent electronically to pharmacy. Left message to return call to notify patient. 

## 2018-08-30 NOTE — Telephone Encounter (Signed)
cefzil 500 bid ten d 

## 2018-08-30 NOTE — Telephone Encounter (Signed)
Pt was seen 08/19/18, states still feels bad with sinus infection  Finished antibiotics and wonders if we can call in another?    Please advise & call pt when done     [facility]

## 2018-08-30 NOTE — Telephone Encounter (Signed)
Was given Amoxil 500 mg for 10 days

## 2018-09-03 DIAGNOSIS — G4733 Obstructive sleep apnea (adult) (pediatric): Secondary | ICD-10-CM | POA: Diagnosis not present

## 2018-09-03 NOTE — Telephone Encounter (Signed)
Contacted patient. Pt states that she has picked up this ABT

## 2018-09-27 ENCOUNTER — Other Ambulatory Visit: Payer: Self-pay | Admitting: Family Medicine

## 2018-10-03 DIAGNOSIS — G4733 Obstructive sleep apnea (adult) (pediatric): Secondary | ICD-10-CM | POA: Diagnosis not present

## 2018-10-23 ENCOUNTER — Other Ambulatory Visit: Payer: Self-pay | Admitting: Family Medicine

## 2018-10-30 ENCOUNTER — Other Ambulatory Visit: Payer: Self-pay | Admitting: Family Medicine

## 2018-11-03 DIAGNOSIS — G4733 Obstructive sleep apnea (adult) (pediatric): Secondary | ICD-10-CM | POA: Diagnosis not present

## 2018-11-12 ENCOUNTER — Other Ambulatory Visit: Payer: Self-pay

## 2018-11-12 MED ORDER — INDAPAMIDE 2.5 MG PO TABS: ORAL_TABLET | ORAL | 1 refills | Status: DC

## 2018-11-13 ENCOUNTER — Ambulatory Visit (INDEPENDENT_AMBULATORY_CARE_PROVIDER_SITE_OTHER): Payer: PPO | Admitting: Family Medicine

## 2018-11-13 ENCOUNTER — Encounter: Payer: Self-pay | Admitting: Family Medicine

## 2018-11-13 ENCOUNTER — Encounter: Payer: Self-pay | Admitting: Gastroenterology

## 2018-11-13 VITALS — BP 130/90 | Temp 98.4°F | Wt 162.0 lb

## 2018-11-13 DIAGNOSIS — J019 Acute sinusitis, unspecified: Secondary | ICD-10-CM | POA: Diagnosis not present

## 2018-11-13 DIAGNOSIS — D509 Iron deficiency anemia, unspecified: Secondary | ICD-10-CM | POA: Diagnosis not present

## 2018-11-13 DIAGNOSIS — R5383 Other fatigue: Secondary | ICD-10-CM

## 2018-11-13 LAB — POCT HEMOGLOBIN: Hemoglobin: 9.2 g/dL — AB (ref 11–14.6)

## 2018-11-13 MED ORDER — CEFPROZIL 500 MG PO TABS: 500.0000 mg | ORAL_TABLET | Freq: Two times a day (BID) | ORAL | 0 refills | Status: DC

## 2018-11-13 NOTE — Progress Notes (Addendum)
Subjective:    Patient ID: GUY TONEY, female    DOB: 1951/09/19, 68 y.o.   MRN: [id number]  HPI  Pt here today due to feeling tired, being cold and continually eating ice. Pt states her iron has been low before and she is having the same symptoms. Pt is experiencing  headaches also. Pt states she has also been sneezing, coughing and feels like she has been drug though the mud. Fatigue going on for about a month and congestion/sneezing about a week   Pt would like refill of Norvasc and Cozaar sent to [facility].  Pt had CBC in August 2019, referral placed for GI but pt did not follow through with this due to a lot going on at the time. Now feeling worse. Denies any bleeding. Bought some iron supplements last week - taking 1 everyday.  Reports 1 week ago started with sneezing, productive cough, and congestion. No fever. Symptoms staying the same no improvement. Has not tried anything otc for her symptoms. Reports also intermittent h/a right temple area and behind her eyes, states feels like pressure. No N/V, no photosensitivity.   Review of Systems  Constitutional: Negative for chills and fever.  HENT: Positive for congestion, sinus pressure and sneezing. Negative for ear pain and sore throat.   Eyes: Negative for photophobia and visual disturbance.  Respiratory: Positive for cough. Negative for shortness of breath and wheezing.   Gastrointestinal: Negative for anal bleeding and blood in stool.  Genitourinary: Negative for hematuria and vaginal bleeding.  Neurological: Positive for headaches. Negative for weakness and numbness.       Objective:   Physical Exam Vitals signs and nursing note reviewed.  Constitutional:      General: She is not in acute distress.    Appearance: Normal appearance. She is not toxic-appearing.  HENT:     Head: Normocephalic and atraumatic.     Right Ear: Tympanic membrane normal.     Left Ear: Tympanic membrane normal.     Nose:     Right  Sinus: Maxillary sinus tenderness and frontal sinus tenderness present.     Left Sinus: No maxillary sinus tenderness or frontal sinus tenderness.     Mouth/Throat:     Mouth: Mucous membranes are moist.     Pharynx: Oropharynx is clear.  Eyes:     General:        Right eye: No discharge.        Left eye: No discharge.     Pupils: Pupils are equal, round, and reactive to light.  Neck:     Musculoskeletal: Neck supple. No neck rigidity.  Cardiovascular:     Rate and Rhythm: Normal rate and regular rhythm.     Heart sounds: Normal heart sounds.  Pulmonary:     Effort: Pulmonary effort is normal. No respiratory distress.     Breath sounds: Normal breath sounds.  Lymphadenopathy:     Cervical: No cervical adenopathy.  Skin:    General: Skin is warm and dry.  Neurological:     General: No focal deficit present.     Mental Status: She is alert and oriented to person, place, and time.     Motor: No weakness.     Gait: Gait normal.  Psychiatric:        Mood and Affect: Mood normal.      Results for orders placed or performed in visit on 11/13/18  POCT hemoglobin  Result Value Ref Range   Hemoglobin  9.2 (A) 11 - 14.6 g/dL        Assessment & Plan:  1. Other fatigue - Plan: POCT hemoglobin, CBC with Differential, Ferritin, Iron Binding Cap (TIBC)([facility]), Retic, Ambulatory referral to Gastroenterology Patient with history of iron deficiency anemia.  Has not followed through with GI referral from August.  She is not having any active bleeding that she is aware of.  Discussed the concern for potential for GI bleeding based on steadily decreasing hemoglobin, and need for urgent GI referral for colonoscopy to evaluate this.  Patient has seen Dr. Eden Lathe in the past and would like to see her again.  Referral placed.  Lab work ordered.  Will notify of results.  2. Acute rhinosinusitis Discussed likely post viral etiology.  Will treat with antibiotics. Headache with no red flags,  likely r/t sinus pressure. Symptomatic care discussed.  Warning signs discussed.  Follow-up if symptoms worsen or fail to improve.  Dr. [name] was consulted on this case and is in agreement with the above treatment plan.  Pt has f/u office visit with Dr. Nicki Reaper in early March for her chronic disease f/u. She is aware of this appointment.

## 2018-11-14 ENCOUNTER — Other Ambulatory Visit: Payer: Self-pay | Admitting: Family Medicine

## 2018-11-14 LAB — CBC WITH DIFFERENTIAL/PLATELET
Basophils Absolute: 0.1 10*3/uL (ref 0.0–0.2)
Basos: 1 %
EOS (ABSOLUTE): 0.1 10*3/uL (ref 0.0–0.4)
Eos: 1 %
Hematocrit: 32.5 % — ABNORMAL LOW (ref 34.0–46.6)
Hemoglobin: 10.1 g/dL — ABNORMAL LOW (ref 11.1–15.9)
Immature Grans (Abs): 0 10*3/uL (ref 0.0–0.1)
Immature Granulocytes: 1 %
Lymphocytes Absolute: 2.4 10*3/uL (ref 0.7–3.1)
Lymphs: 36 %
MCH: 25 pg — ABNORMAL LOW (ref 26.6–33.0)
MCHC: 31.1 g/dL — ABNORMAL LOW (ref 31.5–35.7)
MCV: 80 fL (ref 79–97)
Monocytes Absolute: 0.4 10*3/uL (ref 0.1–0.9)
Monocytes: 7 %
Neutrophils Absolute: 3.6 10*3/uL (ref 1.4–7.0)
Neutrophils: 54 %
Platelets: 366 10*3/uL (ref 150–450)
RBC: 4.04 x10E6/uL (ref 3.77–5.28)
RDW: 16.5 % — ABNORMAL HIGH (ref 11.7–15.4)
WBC: 6.6 10*3/uL (ref 3.4–10.8)

## 2018-11-14 LAB — IRON AND TIBC
Iron Saturation: 13 % — ABNORMAL LOW (ref 15–55)
Iron: 49 ug/dL (ref 27–139)
Total Iron Binding Capacity: 366 ug/dL (ref 250–450)
UIBC: 317 ug/dL (ref 118–369)

## 2018-11-14 LAB — RETICULOCYTES: Retic Ct Pct: 1.1 % (ref 0.6–2.6)

## 2018-11-14 LAB — FERRITIN: Ferritin: 20 ng/mL (ref 15–150)

## 2018-11-16 ENCOUNTER — Emergency Department (HOSPITAL_COMMUNITY)
Admission: EM | Admit: 2018-11-16 | Discharge: 2018-11-16 | Disposition: A | Discharge status: Home / Self Care | Payer: PPO | Attending: Emergency Medicine | Admitting: Emergency Medicine

## 2018-11-16 ENCOUNTER — Emergency Department (HOSPITAL_COMMUNITY): Payer: PPO

## 2018-11-16 DIAGNOSIS — Z79899 Other long term (current) drug therapy: Secondary | ICD-10-CM | POA: Diagnosis not present

## 2018-11-16 DIAGNOSIS — S9032XA Contusion of left foot, initial encounter: Secondary | ICD-10-CM | POA: Diagnosis not present

## 2018-11-16 DIAGNOSIS — S99922A Unspecified injury of left foot, initial encounter: Secondary | ICD-10-CM | POA: Diagnosis present

## 2018-11-16 DIAGNOSIS — E119 Type 2 diabetes mellitus without complications: Secondary | ICD-10-CM | POA: Insufficient documentation

## 2018-11-16 DIAGNOSIS — Y939 Activity, unspecified: Secondary | ICD-10-CM | POA: Insufficient documentation

## 2018-11-16 DIAGNOSIS — E039 Hypothyroidism, unspecified: Secondary | ICD-10-CM | POA: Insufficient documentation

## 2018-11-16 DIAGNOSIS — M79672 Pain in left foot: Secondary | ICD-10-CM | POA: Diagnosis not present

## 2018-11-16 DIAGNOSIS — W228XXA Striking against or struck by other objects, initial encounter: Secondary | ICD-10-CM | POA: Insufficient documentation

## 2018-11-16 DIAGNOSIS — Y92000 Kitchen of unspecified non-institutional (private) residence as  the place of occurrence of the external cause: Secondary | ICD-10-CM | POA: Insufficient documentation

## 2018-11-16 DIAGNOSIS — Y999 Unspecified external cause status: Secondary | ICD-10-CM | POA: Insufficient documentation

## 2018-11-16 DIAGNOSIS — I10 Essential (primary) hypertension: Secondary | ICD-10-CM | POA: Insufficient documentation

## 2018-11-16 DIAGNOSIS — Z87891 Personal history of nicotine dependence: Secondary | ICD-10-CM | POA: Insufficient documentation

## 2018-11-16 DIAGNOSIS — M7989 Other specified soft tissue disorders: Secondary | ICD-10-CM | POA: Diagnosis not present

## 2018-11-16 MED ORDER — IBUPROFEN 400 MG PO TABS
400.0000 mg | ORAL_TABLET | Freq: Once | ORAL | Status: AC
  Administered 2018-11-16: 400 mg via ORAL
  Filled 2018-11-16: qty 1

## 2018-11-16 MED ORDER — ACETAMINOPHEN 325 MG PO TABS
650.0000 mg | ORAL_TABLET | Freq: Once | ORAL | Status: AC
  Administered 2018-11-16: 650 mg via ORAL
  Filled 2018-11-16: qty 2

## 2018-11-16 NOTE — ED Provider Notes (Signed)
[facility] EMERGENCY DEPARTMENT Provider Note   CSN: [id number] Arrival date & time: 11/16/18  0026  Time seen 3:38 AM   History   Chief Complaint Chief Complaint  Patient presents with  . Foot Injury    HPI Joanna [name] is a 68 y.o. female.  HPI patient states she dropped a glass spice jar that had red pepper in it and she estimates it held about 6 ounces on the top of her left foot on January 30.  She points to the area near the base of the little toe.  She states she has had pain in that area and it is getting worse.  She also relates about a year ago she had stubbed her toe on a bedpost but never got treated for it.  She states she has been to [facility] in the past.  She has not taken anything for pain this evening.  PCP [name], MD   Past Medical History:  Diagnosis Date  . 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 Depression   . Diabetes mellitus   . Diverticulosis   . Dyslipidemia   . GERD (gastroesophageal reflux disease)   . Hypertension   . Insomnia   . Microproteinuria   . Renal insufficiency 2010  . Right carpal tunnel syndrome 12/24/2017  . Sleep apnea   . Sleep apnea 12/25/2017   Abnormal sleep study February 2019 CPAP ordered  . Thyroid disease    hypothyroid    Patient Active Problem List   Diagnosis Date Noted  . Sleep apnea 12/25/2017  . Right carpal tunnel syndrome 12/24/2017  . Chronic anxiety 02/13/2017  . Right rotator cuff tendinitis 02/11/2015  . Iron deficiency 08/25/2014  . Microcytic anemia 06/17/2014  . Insomnia 05/04/2014  . Hypothyroidism 06/05/2013  . Renal insufficiency 06/05/2013  . Essential hypertension, benign 06/05/2013  . Stress fracture of right foot 02/27/2013  . Chronic radicular lumbar pain 02/27/2013  . Hyperlipidemia 01/16/2013  . Type 2 diabetes mellitus with hemoglobin A1c goal of less than 7.5% ([place]) 01/16/2013    Past Surgical History:  Procedure  Laterality Date  . ABDOMINAL HYSTERECTOMY  1984  . BACK SURGERY    . BILATERAL OOPHORECTOMY    . COLONOSCOPY  01/29/12   Fields-2-3 hyperplastic polyps, mod internal hemorrhoids, diverticulosis throughout colon  . COLONOSCOPY N/A 07/03/2014   Procedure: COLONOSCOPY;  Surgeon: [name], MD;  Location: [facility];  Service: Endoscopy;  Laterality: N/A;  230-moved to 8:30 - Ginger notified pt  . ESOPHAGOGASTRODUODENOSCOPY   01/29/2012   Fields-2-3cm sliding HH, fundic gland polyps, NEGATIVE H pylori  . ESOPHAGOGASTRODUODENOSCOPY N/A 07/03/2014   Procedure: ESOPHAGOGASTRODUODENOSCOPY (EGD);  Surgeon: [name], MD;  Location: [facility] SUITE;  Service: Endoscopy;  Laterality: N/A;  . GIVENS CAPSULE STUDY N/A 10/26/2014   SMALL BOWEL AVMs  . KNEE ARTHROSCOPY Right   . TOTAL ABDOMINAL HYSTERECTOMY W/ BILATERAL SALPINGOOPHORECTOMY       OB History   No obstetric history on file.      Home Medications    Prior to Admission medications   Medication Sig Start Date End Date Taking? Authorizing Provider  albuterol (PROVENTIL HFA;VENTOLIN HFA) 108 (90 Base) MCG/ACT inhaler Inhale 2 puffs into the lungs every 4 (four) hours as needed for wheezing or shortness of breath. 01/23/18   [name], MD  ALPRAZolam Duanne Moron) 0.5 MG tablet 1/2 to 1 po BID prn anxiety 08/21/17   [name]  A, MD  amLODipine (NORVASC) 5 MG tablet TAKE 1 TABLET BY MOUTH EVERY DAY 11/14/18   [name], MD  azelastine (ASTELIN) 0.1 % nasal spray Place 1 spray into both nostrils 2 (two) times daily. Use in each nostril as directed 04/17/18   [name], MD  cefPROZIL (CEFZIL) 500 MG tablet Take 1 tablet (500 mg total) by mouth 2 (two) times daily. 11/13/18   Cheyenne Adas, NP  colchicine 0.6 MG tablet 1 bid prn gout 08/21/17   [name], MD  fexofenadine (ALLEGRA) 180 MG tablet Take 180 mg by mouth daily.    [provider]  fluticasone (FLONASE) 50 MCG/ACT nasal spray PLACE 2 SPRAYS INTO  THE NOSE AS NEEDED. 02/18/15   [name], MD  HYDROcodone-acetaminophen (NORCO) 10-325 MG tablet Take 1 tablet by mouth every 6 (six) hours as needed. 06/26/18   [name], MD  indapamide (LOZOL) 2.5 MG tablet TAKE 1 TABLET(2.5 MG) BY MOUTH EVERY MORNING 11/12/18   [name], MD  Insulin Pen Needle 32G X 4 MM MISC Use with Victoza as directed 12/11/17   [name], MD  levothyroxine (SYNTHROID, LEVOTHROID) 88 MCG tablet Take 1/2 tablet by mouth on Monday and take 1 tablet on all other days (change in directions) 10/23/18   Wolfgang Phoenix, [name], MD  liraglutide (VICTOZA) 18 MG/3ML SOPN INJECT 1.2 MG INTO THE SKIN DAILY 03/15/18   [name], MD  losartan (COZAAR) 100 MG tablet TAKE 1 TABLET BY MOUTH EVERY DAY 11/14/18   [name], MD  Magnesium 250 MG TABS Take 250 mg by mouth daily.     [provider]  naproxen (NAPROSYN) 500 MG tablet Take 1 tablet (500 mg total) by mouth 2 (two) times daily with a meal. 08/19/18   [name], MD  omeprazole (PRILOSEC) 40 MG capsule Take 1 capsule by mouth every day 10/30/18   [name], MD  pravastatin (PRAVACHOL) 40 MG tablet Take 1 tablet by mouth every day 09/27/18   [name], MD  topiramate (TOPAMAX) 50 MG tablet Take 1 tablet (50 mg total) by mouth 2 (two) times daily. 05/16/18   [name], MD  triamcinolone cream (KENALOG) 0.1 % Apply 1 application topically 2 (two) times daily. 04/24/17   [name], MD  zolpidem (AMBIEN) 5 MG tablet Take 1 tablet (5 mg total) at bedtime as needed by mouth for sleep. 08/21/17   [name], MD    Family History Family History  Problem Relation Age of Onset  . Cardiomyopathy Father   . Heart failure Mother   . Hyperlipidemia Mother   . Stroke Brother   . Cancer Brother        renal  . Kidney cancer Brother   . Diabetes Paternal Uncle   . Heart disease Paternal Grandmother     Social History Social History   Tobacco Use  . Smoking status: Former  Smoker    Packs/day: 0.50    Years: 30.00    Pack years: 15.00    Types: Cigarettes    Last attempt to quit: 10/16/2010    Years since quitting: 8.0  . Smokeless tobacco: Former Systems developer    Quit date: 10/28/2010  Substance Use Topics  . Alcohol use: Yes    Comment: once per mo, socially  . Drug use: No     Allergies   Ivp dye [iodinated diagnostic agents] and Zoloft [sertraline hcl]  Review of Systems Review of Systems  All other systems reviewed and are negative.    Physical Exam Updated Vital Signs BP 121/87 (BP Location: Right Arm)   Pulse 93   Temp 98.8 F (37.1 C) (Oral)   Resp 16   Ht 5\' 2"  (1.575 m)   Wt 73.5 kg   BMI 29.63 kg/m   Physical Exam Vitals signs and nursing note reviewed.  Constitutional:      Appearance: Normal appearance.  HENT:     Head: Normocephalic and atraumatic.     Right Ear: External ear normal.     Left Ear: External ear normal.     Nose: Nose normal.  Eyes:     Extraocular Movements: Extraocular movements intact.     Conjunctiva/sclera: Conjunctivae normal.  Neck:     Musculoskeletal: Normal range of motion.  Cardiovascular:     Rate and Rhythm: Normal rate.  Pulmonary:     Effort: Pulmonary effort is normal. No respiratory distress.  Musculoskeletal:        General: Swelling and tenderness present.     Comments: Patient is nontender over the malleoli of her left ankle.  She is nontender over the dorsum or the medial aspect of her left foot.  She is very tender over the distal metatarsal of the fifth toe.  There is some mild diffuse swelling over the distal metatarsals of the fourth and fifth toes.  The toes themselves are nontender.  Skin:    General: Skin is warm and dry.     Capillary Refill: Capillary refill takes less than 2 seconds.     Findings: No bruising or erythema.  Neurological:     General: No focal deficit present.     Mental Status: She is alert and oriented to person, place, and time.     Cranial Nerves: No  cranial nerve deficit.  Psychiatric:        Mood and Affect: Mood normal.        Behavior: Behavior normal.        Thought Content: Thought content normal.      ED Treatments / Results  Labs (all labs ordered are listed, but only abnormal results are displayed) Labs Reviewed - No data to display  EKG None  Radiology Dg Foot Complete Left  Result Date: 11/16/2018 CLINICAL DATA:  Patient dropped a jar on her foot on Thursday. Pain is along the outer lateral edge of the foot. EXAM: LEFT FOOT - COMPLETE 3+ VIEW COMPARISON:  None. FINDINGS: Remote healed fracture deformity of the left fifth metatarsal neck is identified. Likewise there appears to be an age-indeterminate fracture deformity of the anterolateral aspect of the cuboid. There is mild soft tissue swelling along the lateral aspect of the forefoot. No joint dislocation or suspicious osseous lesions. The tibiotalar and subtalar joints are maintained. IMPRESSION: 1. Remote healed fracture deformity of the left fifth metatarsal neck. 2. Age-indeterminate fracture deformity of the anterolateral aspect of the cuboid. 3. Mild soft tissue swelling is noted along the lateral aspect of the forefoot. Electronically Signed   By: Ashley Royalty M.D.   On: 11/16/2018 02:24    Procedures Procedures (including critical care time)  Medications Ordered in ED Medications  ibuprofen (ADVIL,MOTRIN) tablet 400 mg (400 mg Oral Given 11/16/18 0358)  acetaminophen (TYLENOL) tablet 650 mg (650 mg Oral Given 11/16/18 0358)     Initial Impression / Assessment and Plan / ED Course  I have reviewed the triage vital signs and the  nursing notes.  Pertinent labs & imaging results that were available during my care of the patient were reviewed by me and considered in my medical decision making (see chart for details).     Patient is tender in the same area where she has a remote healed fracture of the left fifth metatarsal neck.  She does not have pain in the area  over the cuboid.  We discussed that she could have a small occult fracture in the old fracture that is not visible on x-ray but could be visible if she still having a lot of pain another 7 to 10 days.  She was placed in a postop shoe, she can use ice packs for comfort and take ibuprofen and Tylenol for pain.  She was referred back to her orthopedist if not improving over the next week.  Final Clinical Impressions(s) / ED Diagnoses   Final diagnoses:  Contusion of left foot, initial encounter    ED Discharge Orders    None    OTC ibuprofen and acetaminophen  Plan discharge   Rolland Porter, MD, Barbette Or, MD 11/16/18 [phone]

## 2018-11-16 NOTE — Discharge Instructions (Addendum)
Use ice packs for comfort.  Take ibuprofen 400 mg plus acetaminophen 650 mg every 6 hours for pain.  Wear the postop shoe for comfort.  If you are still painful in another 7 to 10 days, please be rechecked by your orthopedist.  [name] orth[facility]ics is now Pharmacologist.  Sometimes there can be a small crack in the bone that is not visible on the initial x-ray but it will show up later.

## 2018-11-16 NOTE — ED Triage Notes (Signed)
Pt states she dropped a spice jar on her left foot on Thursday, states is painful with ambulation.  Pt ambulates without diff.

## 2018-11-27 ENCOUNTER — Other Ambulatory Visit: Payer: Self-pay | Admitting: Pharmacist

## 2018-11-27 NOTE — Patient Outreach (Signed)
[facility]) Care Management  11/27/2018  KAMEE BOBST 12-10-1950 [id number]  Patient failed statin, ACEi/ARB, and diabetes adherence measures in 2019. Screening patient to determine if any pharmacy intervention would benefit medication adherence for 2020.  Per chart review, there are no alarming reasons why the patient appeared nonadherent to therapy.   Contacted patient, HIPAA verifiers identified. We discussed her medications. She understands dosing and indications for her medications. She notes that she uses a pill box to help organize her medications, and has started receiving medications from [facility] order pharmacy.   She notes that she was previously increased from amlodipine 2.5 mg daily to 5 mg daily, and has been taking 2 of the 2.5 mg tablets until she runs out. She notes she needs a prescription for the amlodipine 5 mg tablets sent to [facility].   I will message PCP Dr. Wolfgang Phoenix about the amlodipine refill. Patient denies any other questions or concerns. She has my contact information for any future needs.   Catie Darnelle Maffucci, PharmD, [place] PGY2 Ambulatory Care Pharmacy Resident, [facility] Phone: [phone]

## 2018-11-27 NOTE — Progress Notes (Signed)
Nurses please send her amlodipine prescription to her mail order may have 90 tablets with 3 refills

## 2018-11-28 ENCOUNTER — Other Ambulatory Visit: Payer: Self-pay | Admitting: *Deleted

## 2018-11-28 MED ORDER — AMLODIPINE BESYLATE 5 MG PO TABS: 5.0000 mg | ORAL_TABLET | Freq: Every day | ORAL | 1 refills | Status: DC

## 2018-11-28 NOTE — Progress Notes (Signed)
Refill sent to mail order. 

## 2018-12-02 NOTE — Progress Notes (Addendum)
REVIEWED-[facility] FOR ANEMIA AFTER JUN 1 DUE TO COVID 19 RESTRICTIONS.     Primary Care Physician:  [name], MD  Referring Physician: Cheyenne Adas, NP/Dr. Wolfgang Phoenix Primary Gastroenterologist:  Dr. Oneida Alar   Chief Complaint  Patient presents with  . Anemia    HPI:   Joanna [name] is a 68 y.o. female presenting today at the request of Cheyenne Adas, NP/Dr. Nicki Reaper [name] due to anemia. 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 Last seen in the office Sept 2015 and proceeded with evaluation for IDA late 2015 with colonoscopy/EGD. Hyperplastic polyps on colonoscopy and EGD with gastritis, normal small bowel biopsies. Capsule 2016: small bowel AVMs. After found to have AVMs, recommendations were to discuss benefits vs risks of continuing aspirin. GI felt it best to hold aspirin unless medically necessary to continue.   Recent labs reviewed from Jan 2020 with Hgb 10.1, ferritin 20 (was 58 a year ago). Iron 49, iron sats low at 13%. She had normal Hgb in 2016, mildly anemic in 2017, with Hgb in upper 10 range late 2019. Ferritin has also been drifting. Appears she was last seen in March 2016 by Dr. Whitney Muse. She has received iron infusions in the past.   Started to crave ice again and felt cold in Nov/Dec 2019. No overt GI bleeding. Restarted oral iron when she started craving ice. Prior to this had not been taking iron. No abdominal pain. States stomach growls a lot more than normal. No reflux, no dysphagia. No N/V. Some loss of appetite. Unintentional weight loss noted. Been stressed over physical health recently. Takes Goody powders if bad headaches. Occasional Ibuprofen for headaches. Takes NSAIDs a few times a week. Taking omeprazole 40 mg daily. Takes in the morning.   Creatinine 1.61 in March 2019. Will be having more blood work in March when she returns to see Dr. Wolfgang Phoenix.    Past Medical History:  Diagnosis Date  . AVM (arteriovenous malformation) of  small bowel, acquired JAN 2016 GIVENS  . Depression   . Diabetes mellitus   . Diverticulosis   . Dyslipidemia   . GERD (gastroesophageal reflux disease)   . Hypertension   . Insomnia   . Microproteinuria   . Renal insufficiency 2010  . Right carpal tunnel syndrome 12/24/2017  . Sleep apnea   . Sleep apnea 12/25/2017   Abnormal sleep study February 2019 CPAP ordered  . Thyroid disease    hypothyroid    Past Surgical History:  Procedure Laterality Date  . ABDOMINAL HYSTERECTOMY  1984  . BACK SURGERY    . BILATERAL OOPHORECTOMY    . COLONOSCOPY  01/29/12   Fields-2-3 hyperplastic polyps, mod internal hemorrhoids, diverticulosis throughout colon  . COLONOSCOPY N/A 07/03/2014   Dr. Oneida Alar: Hyperplastic polyps  . ESOPHAGOGASTRODUODENOSCOPY   01/29/2012   Fields-2-3cm sliding HH, fundic gland polyps, NEGATIVE H pylori  . ESOPHAGOGASTRODUODENOSCOPY N/A 07/03/2014   Dr. Oneida Alar: gastritis and normal small bowel biopsies  . GIVENS CAPSULE STUDY N/A 10/26/2014   SMALL BOWEL AVMs  . KNEE ARTHROSCOPY Right   . TOTAL ABDOMINAL HYSTERECTOMY W/ BILATERAL SALPINGOOPHORECTOMY      Current Outpatient Medications  Medication Sig Dispense Refill  . albuterol (PROVENTIL HFA;VENTOLIN HFA) 108 (90 Base) MCG/ACT inhaler Inhale 2 puffs into the lungs every 4 (four) hours as needed for wheezing or shortness of breath. 1 Inhaler 5  . ALPRAZolam (XANAX) 0.5 MG tablet 1/2 to 1 po BID prn anxiety  30 tablet 5  . amLODipine (NORVASC) 5 MG tablet Take 1 tablet (5 mg total) by mouth daily. 90 tablet 1  . colchicine 0.6 MG tablet 1 bid prn gout 90 tablet 1  . ferrous sulfate 325 (65 FE) MG tablet Take 325 mg by mouth daily with breakfast.    . fexofenadine (ALLEGRA) 180 MG tablet Take 180 mg by mouth daily.    . fluticasone (FLONASE) 50 MCG/ACT nasal spray PLACE 2 SPRAYS INTO THE NOSE AS NEEDED. 16 g 5  . HYDROcodone-acetaminophen (NORCO) 10-325 MG tablet Take 1 tablet by mouth every 6 (six) hours as needed. 20  tablet 0  . indapamide (LOZOL) 2.5 MG tablet TAKE 1 TABLET(2.5 MG) BY MOUTH EVERY MORNING 90 tablet 1  . Insulin Pen Needle 32G X 4 MM MISC Use with Victoza as directed 100 each 1  . levothyroxine (SYNTHROID, LEVOTHROID) 88 MCG tablet Take 1/2 tablet by mouth on Monday and take 1 tablet on all other days (change in directions) 84 tablet 0  . liraglutide (VICTOZA) 18 MG/3ML SOPN INJECT 1.2 MG INTO THE SKIN DAILY 6 pen 1  . losartan (COZAAR) 100 MG tablet TAKE 1 TABLET BY MOUTH EVERY DAY 90 tablet 1  . Magnesium 250 MG TABS Take 250 mg by mouth daily.     . Multiple Vitamin (MULTIVITAMIN) tablet Take 1 tablet by mouth daily.    Marland Kitchen omeprazole (PRILOSEC) 40 MG capsule Take 1 capsule by mouth every day 90 capsule 1  . pravastatin (PRAVACHOL) 40 MG tablet Take 1 tablet by mouth every day 90 tablet 0  . Simethicone (GAS-X PO) Take by mouth as needed.    . zolpidem (AMBIEN) 5 MG tablet Take 1 tablet (5 mg total) at bedtime as needed by mouth for sleep. 30 tablet 5   No current facility-administered medications for this visit.     Allergies as of 12/03/2018 - Review Complete 12/03/2018  Allergen Reaction Noted  . Ivp dye [iodinated diagnostic agents]  06/04/2013  . Zoloft [sertraline hcl]  05/21/2017    Family History  Problem Relation Age of Onset  . Cardiomyopathy Father   . Heart failure Mother   . Hyperlipidemia Mother   . Stroke Brother   . Cancer Brother        renal  . Kidney cancer Brother   . Diabetes Paternal Uncle   . Heart disease Paternal Grandmother   . Colon cancer Neg Hx   . Colon polyps Neg Hx     Social History   Socioeconomic History  . Marital status: Divorced    Spouse name: Not on file  . Number of children: 2  . Years of education: Not on file  . Highest education level: Not on file  Occupational History  . Occupation: retired    Comment: 2014 or so  Social Needs  . Financial resource strain: Not on file  . Food insecurity:    Worry: Not on file     Inability: Not on file  . Transportation needs:    Medical: Not on file    Non-medical: Not on file  Tobacco Use  . Smoking status: Current Every Day Smoker    Packs/day: 0.50    Years: 30.00    Pack years: 15.00    Types: Cigarettes    Last attempt to quit: 10/16/2010    Years since quitting: 8.1  . Smokeless tobacco: Former Systems developer    Quit date: 10/28/2010  . Tobacco comment: quit in 2012 then restarted  again May 2019.   Substance and Sexual Activity  . Alcohol use: Yes    Comment: socially, maybe 3 times a year   . Drug use: No  . Sexual activity: Not on file  Lifestyle  . Physical activity:    Days per week: Not on file    Minutes per session: Not on file  . Stress: Not on file  Relationships  . Social connections:    Talks on phone: Not on file    Gets together: Not on file    Attends religious service: Not on file    Active member of club or organization: Not on file    Attends meetings of clubs or organizations: Not on file    Relationship status: Not on file  . Intimate partner violence:    Fear of current or ex partner: Not on file    Emotionally abused: Not on file    Physically abused: Not on file    Forced sexual activity: Not on file  Other Topics Concern  . Not on file  Social History Narrative  . Not on file    Review of Systems: Gen: Denies any fever, chills, fatigue, weight loss, lack of appetite.  CV: Denies chest pain, heart palpitations, peripheral edema, syncope.  Resp: Denies shortness of breath at rest or with exertion. Denies wheezing or cough.  GI: see HPI GU : Denies urinary burning, urinary frequency, urinary hesitancy MS: Denies joint pain, muscle weakness, cramps, or limitation of movement.  Derm: Denies rash, itching, dry skin Psych: Denies depression, anxiety, memory loss, and confusion Heme: Denies bruising, bleeding, and enlarged lymph nodes.  Physical Exam: BP 114/78   Pulse (!) 105   Temp (!) 97.2 F (36.2 C) (Oral)   Ht 5\' 2"   (1.575 m)   Wt 157 lb 6.4 oz (71.4 kg)   BMI 28.79 kg/m  General:   Alert and oriented. Pleasant and cooperative. Well-nourished and well-developed.  Head:  Normocephalic and atraumatic. Eyes:  Without icterus, sclera clear and conjunctiva pink.  Ears:  Normal auditory acuity. Nose:  No deformity, discharge,  or lesions. Mouth:  No deformity or lesions, oral mucosa pink.  Lungs:  Clear to auscultation bilaterally.  Heart:  S1, S2 present without murmurs appreciated.  Abdomen:  +BS, soft, non-tender and non-distended. No HSM noted. No guarding or rebound. No masses appreciated.  Rectal:  Deferred  Msk:  Symmetrical without gross deformities. Normal posture. Extremities:  Without edema. Neurologic:  Alert and  oriented x4 Psych:  Alert and cooperative. Normal mood and affect.

## 2018-12-03 ENCOUNTER — Encounter: Payer: Self-pay | Admitting: Gastroenterology

## 2018-12-03 ENCOUNTER — Other Ambulatory Visit: Payer: Self-pay

## 2018-12-03 ENCOUNTER — Ambulatory Visit (INDEPENDENT_AMBULATORY_CARE_PROVIDER_SITE_OTHER): Payer: PPO | Admitting: Gastroenterology

## 2018-12-03 DIAGNOSIS — D500 Iron deficiency anemia secondary to blood loss (chronic): Secondary | ICD-10-CM

## 2018-12-03 DIAGNOSIS — D509 Iron deficiency anemia, unspecified: Secondary | ICD-10-CM | POA: Insufficient documentation

## 2018-12-03 NOTE — Patient Instructions (Signed)
We are arranging a colonoscopy and upper endoscopy with Dr. Oneida Alar in the near future.  Please stop iron 7 days before the procedure.  I am also referring you back to Hematology. In the meantime, we can arrange an outpatient iron infusion for you. It will be good for Hematology to follow you long-term due to your history.  Please avoid any Ibuprofen, Advil, Aleve, Motrin, Goody Powders, BC powders, etc. Continue with omeprazole once daily.  We will see you back in 3 months!  Further recommendations to follow!  It was a pleasure to see you today. I strive to create trusting relationships with patients to provide genuine, compassionate, and quality care. I value your feedback. If you receive a survey regarding your visit,  I greatly appreciate you taking time to fill this out.   Annitta Needs, PhD, ANP-BC [facility]

## 2018-12-04 DIAGNOSIS — G4733 Obstructive sleep apnea (adult) (pediatric): Secondary | ICD-10-CM | POA: Diagnosis not present

## 2018-12-05 ENCOUNTER — Telehealth: Payer: Self-pay

## 2018-12-05 NOTE — Telephone Encounter (Signed)
I have faxed the orders to the hospital  for the iron infusion.

## 2018-12-06 DIAGNOSIS — M79672 Pain in left foot: Secondary | ICD-10-CM | POA: Insufficient documentation

## 2018-12-09 NOTE — Assessment & Plan Note (Signed)
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 She has had drifting Hgb and ferritin noted recently, now with ice cravings and feeling fatigued. Notably, she takes NSAIDs a few times a week. Of concern is unintentional weight loss. Discussed updating colonoscopy+/- EGD in setting of worsening IDA, weight loss. As she is symptomatic, will also order Feraheme infusion and refer back to Hematology so she may be followed closely. Anticipate she may need iron infusions periodically.   Proceed with colonoscopy with Dr. Oneida Alar in the near future. The risks, benefits, and alternatives have been discussed in detail with the patient. They state understanding and desire to proceed.  Feraheme IV X 1 Hematology referral Avoid NSAIDs  Hold iron 7 days prior 3 month follow-up

## 2018-12-10 ENCOUNTER — Inpatient Hospital Stay (HOSPITAL_COMMUNITY): Payer: PPO | Attending: Hematology | Admitting: Hematology

## 2018-12-10 ENCOUNTER — Other Ambulatory Visit: Payer: Self-pay

## 2018-12-10 ENCOUNTER — Encounter (HOSPITAL_COMMUNITY): Payer: Self-pay | Admitting: Hematology

## 2018-12-10 ENCOUNTER — Inpatient Hospital Stay (HOSPITAL_COMMUNITY): Payer: PPO

## 2018-12-10 VITALS — BP 102/60 | HR 109 | Temp 98.2°F | Resp 20 | Ht 62.0 in | Wt 157.5 lb

## 2018-12-10 DIAGNOSIS — D500 Iron deficiency anemia secondary to blood loss (chronic): Secondary | ICD-10-CM

## 2018-12-10 DIAGNOSIS — R634 Abnormal weight loss: Secondary | ICD-10-CM | POA: Diagnosis not present

## 2018-12-10 DIAGNOSIS — Z9989 Dependence on other enabling machines and devices: Secondary | ICD-10-CM

## 2018-12-10 DIAGNOSIS — Z720 Tobacco use: Secondary | ICD-10-CM

## 2018-12-10 DIAGNOSIS — Z122 Encounter for screening for malignant neoplasm of respiratory organs: Secondary | ICD-10-CM

## 2018-12-10 DIAGNOSIS — G4733 Obstructive sleep apnea (adult) (pediatric): Secondary | ICD-10-CM | POA: Diagnosis not present

## 2018-12-10 DIAGNOSIS — D509 Iron deficiency anemia, unspecified: Secondary | ICD-10-CM

## 2018-12-10 DIAGNOSIS — R630 Anorexia: Secondary | ICD-10-CM | POA: Diagnosis not present

## 2018-12-10 LAB — IRON AND TIBC
Iron: 38 ug/dL (ref 28–170)
Saturation Ratios: 10 % — ABNORMAL LOW (ref 10.4–31.8)
TIBC: 399 ug/dL (ref 250–450)
UIBC: 361 ug/dL

## 2018-12-10 LAB — CBC WITH DIFFERENTIAL/PLATELET
Band Neutrophils: 0 %
Basophils Absolute: 0 10*3/uL (ref 0.0–0.1)
Basophils Relative: 0 %
Blasts: 0 %
Eosinophils Absolute: 0 10*3/uL (ref 0.0–0.5)
Eosinophils Relative: 0 %
HCT: 34 % — ABNORMAL LOW (ref 36.0–46.0)
Hemoglobin: 10 g/dL — ABNORMAL LOW (ref 12.0–15.0)
Lymphocytes Relative: 24 %
Lymphs Abs: 1.8 10*3/uL (ref 0.7–4.0)
MCH: 24.4 pg — ABNORMAL LOW (ref 26.0–34.0)
MCHC: 29.4 g/dL — ABNORMAL LOW (ref 30.0–36.0)
MCV: 82.9 fL (ref 80.0–100.0)
Metamyelocytes Relative: 0 %
Monocytes Absolute: 0.4 10*3/uL (ref 0.1–1.0)
Monocytes Relative: 6 %
Myelocytes: 0 %
Neutro Abs: 5.2 10*3/uL (ref 1.7–7.7)
Neutrophils Relative %: 70 %
Other: 0 %
Platelets: 319 10*3/uL (ref 150–400)
Promyelocytes Relative: 0 %
RBC: 4.1 MIL/uL (ref 3.87–5.11)
RDW: 17.2 % — ABNORMAL HIGH (ref 11.5–15.5)
WBC: 7.4 10*3/uL (ref 4.0–10.5)
nRBC: 0 % (ref 0.0–0.2)
nRBC: 0 /100 WBC

## 2018-12-10 LAB — LACTATE DEHYDROGENASE: LDH: 134 U/L (ref 98–192)

## 2018-12-10 LAB — RETICULOCYTES
Immature Retic Fract: 12 % (ref 2.3–15.9)
RBC.: 4.1 MIL/uL (ref 3.87–5.11)
Retic Count, Absolute: 49.2 10*3/uL (ref 19.0–186.0)
Retic Ct Pct: 1.2 % (ref 0.4–3.1)

## 2018-12-10 LAB — SAVE SMEAR (SSMR)

## 2018-12-10 LAB — DIRECT ANTIGLOBULIN TEST (NOT AT ARMC)
DAT, IgG: NEGATIVE
DAT, complement: NEGATIVE

## 2018-12-10 LAB — FERRITIN: Ferritin: 11 ng/mL (ref 11–307)

## 2018-12-10 LAB — VITAMIN B12: Vitamin B-12: 491 pg/mL (ref 180–914)

## 2018-12-10 LAB — FOLATE: Folate: 10.8 ng/mL (ref >5.9)

## 2018-12-10 NOTE — Progress Notes (Signed)
cc'ed to pcp °

## 2018-12-10 NOTE — Patient Instructions (Signed)
[facility] Discharge Instructions     Thank you for choosing  Can[facility] Center at [facility] to provide your oncology and hematology care.  To afford each patient quality time with our provider, please arrive at least 15 minutes before your scheduled appointment time.   If you have a lab appointment with the Cancer Center please come in thru the  Main Entrance and check in at the main information desk  You need to re-schedule your appointment should you arrive 10 or more minutes late.  We strive to give you quality time with our providers, and arriving late affects you and other patients whose appointments are after yours.  Also, if you no show three or more times for appointments you may be dismissed from the clinic at the providers discretion.     Again, thank you for choosing [facility].  Our hope is that these requests will decrease the amount of time that you wait before being seen by our physicians.       _____________________________________________________________  Should you have questions after your visit to [facility], please contact our office at [phone] between the hours of 8:00 a.m. and 4:30 p.m.  Voicemails left after 4:00 p.m. will not be returned until the following business day.  For prescription refill requests, have your pharmacy contact our office and allow 72 hours.    Cancer Center Support Programs:   > Cancer Support Group  2nd Tuesday of the month 1pm-2pm, Journey Room    

## 2018-12-10 NOTE — Progress Notes (Signed)
CONSULT NOTE  Patient Care Team: [name], MD as PCP - General (Family Medicine) [name], MD (Gastroenterology) Essenmacher, Clarene Duke, OT as Occupational Therapist (Occupational Therapy) Dagger, Orie Fisherman, OT (Inactive) as Occupational Therapist (Occupational Therapy)  CHIEF COMPLAINTS/PURPOSE OF CONSULTATION: Anemia   HISTORY OF PRESENTING ILLNESS:  Joanna [name] 68 y.o. female is here because of anemia. She was seen in our clinic over 3 years ago for iron deficiency anemia. She reports she stopped coming for an unknown reason. She has been doing well until recently she started having ice chip cravings and knew she was anemia again. She was seen by her primary care MD and Roseanne Kaufman NP with GI. [name] set her up for one iron infusion and sent her to [place]. She reports a 47 pound unintentional weight loss over the past one year due to loss in appetite. She has tried oral iron supplements several times and they cause constipation and abdominal pain and she doesn't tolerated them well. Her last iron infusion was in 08/2014. She is having dizziness and lightheadedness at times during the day. Denies any nausea, vomiting, or diarrhea. Denies any new pains. Had not noticed any recent bleeding such as epistaxis, hematuria or hematochezia. Denies recent chest pain on exertion, shortness of breath on minimal exertion, pre-syncopal episodes, or palpitations. Denies any numbness or tingling in hands or feet. Denies any recent fevers, infections, or recent hospitalizations. Patient reports appetite at 60% and energy level at 0%. She reports she has never had to receive a blood transfusion in the past.  Her last colonoscopy was in 2015 she saw her GI doctor last week and they are setting her up with one in the next couple of months. She reports she has Obstructive sleep apnea and has been wearing her CPAP mask for over 2 years. She started smoking at age 10. She smoked a half a pack per day for most  of her life. She stopped about 2-3 years ago and recently started back. She is only smoking forth of a pack per day now. She reports the last time she had a low dose lung screening scan was in 2010.  She reports her only family history was her brother had kidney cancer. Denies hemologic family history.  She lives alone and performs all her own ALDs and activities. She is able to drive and handle all her own finances.  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 She reports she was exposed to unknown chemicals and different type of metals.     MEDICAL HISTORY:  Past Medical History:  Diagnosis Date  . 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 Depression   . Diabetes mellitus   . Diverticulosis   . Dyslipidemia   . GERD (gastroesophageal reflux disease)   . Hypertension   . Insomnia   . Microproteinuria   . Renal insufficiency 2010  . Right carpal tunnel syndrome 12/24/2017  . Sleep apnea   . Sleep apnea 12/25/2017   Abnormal sleep study February 2019 CPAP ordered  . Thyroid disease    hypothyroid    SURGICAL HISTORY: Past Surgical History:  Procedure Laterality Date  . ABDOMINAL HYSTERECTOMY  1984  . BACK SURGERY    . BILATERAL OOPHORECTOMY    . COLONOSCOPY  01/29/12   Fields-2-3 hyperplastic polyps, mod internal hemorrhoids, diverticulosis throughout colon  . COLONOSCOPY N/A 07/03/2014   Dr. Oneida Alar: Hyperplastic polyps  .  ESOPHAGOGASTRODUODENOSCOPY   01/29/2012   Fields-2-3cm sliding HH, fundic gland polyps, NEGATIVE H pylori  . ESOPHAGOGASTRODUODENOSCOPY N/A 07/03/2014   Dr. Oneida Alar: gastritis and normal small bowel biopsies  . GIVENS CAPSULE STUDY N/A 10/26/2014   SMALL BOWEL AVMs  . KNEE ARTHROSCOPY Right   . TOTAL ABDOMINAL HYSTERECTOMY W/ BILATERAL SALPINGOOPHORECTOMY      SOCIAL HISTORY: Social History   Socioeconomic History  . Marital status: Divorced    Spouse name: Not on file  . Number of children: 2  .  Years of education: Not on file  . Highest education level: Not on file  Occupational History  . Occupation: retired    Comment: 2014 or so  Social Needs  . Financial resource strain: Not on file  . Food insecurity:    Worry: Not on file    Inability: Not on file  . Transportation needs:    Medical: Not on file    Non-medical: Not on file  Tobacco Use  . Smoking status: Current Some Day Smoker    Packs/day: 0.25    Years: 30.00    Pack years: 7.50    Types: Cigarettes    Last attempt to quit: 10/16/2010    Years since quitting: 8.1  . Smokeless tobacco: Former Systems developer    Quit date: 10/28/2010  . Tobacco comment: quit in 2012 then restarted again May 2019.   Substance and Sexual Activity  . Alcohol use: Yes    Comment: socially, maybe 3 times a year   . Drug use: No  . Sexual activity: Not on file  Lifestyle  . Physical activity:    Days per week: Not on file    Minutes per session: Not on file  . Stress: Not on file  Relationships  . Social connections:    Talks on phone: Not on file    Gets together: Not on file    Attends religious service: Not on file    Active member of club or organization: Not on file    Attends meetings of clubs or organizations: Not on file    Relationship status: Not on file  . Intimate partner violence:    Fear of current or ex partner: Not on file    Emotionally abused: Not on file    Physically abused: Not on file    Forced sexual activity: Not on file  Other Topics Concern  . Not on file  Social History Narrative  . Not on file    FAMILY HISTORY: Family History  Problem Relation Age of Onset  . Cardiomyopathy Father   . Heart failure Mother   . Hyperlipidemia Mother   . Stroke Brother   . Kidney cancer Brother   . Diabetes Paternal Uncle   . Heart disease Paternal Grandmother   . Colon cancer Neg Hx   . Colon polyps Neg Hx     ALLERGIES:  is allergic to ivp dye [iodinated diagnostic agents] and zoloft [sertraline  hcl].  MEDICATIONS:  Current Outpatient Medications  Medication Sig Dispense Refill  . albuterol (PROVENTIL HFA;VENTOLIN HFA) 108 (90 Base) MCG/ACT inhaler Inhale 2 puffs into the lungs every 4 (four) hours as needed for wheezing or shortness of breath. 1 Inhaler 5  . ALPRAZolam (XANAX) 0.5 MG tablet 1/2 to 1 po BID prn anxiety 30 tablet 5  . amLODipine (NORVASC) 5 MG tablet Take 1 tablet (5 mg total) by mouth daily. 90 tablet 1  . colchicine 0.6 MG tablet 1 bid prn gout 90  tablet 1  . ferrous sulfate 325 (65 FE) MG tablet Take 325 mg by mouth daily with breakfast.    . fexofenadine (ALLEGRA) 180 MG tablet Take 180 mg by mouth daily.    . fluticasone (FLONASE) 50 MCG/ACT nasal spray PLACE 2 SPRAYS INTO THE NOSE AS NEEDED. 16 g 5  . HYDROcodone-acetaminophen (NORCO) 10-325 MG tablet Take 1 tablet by mouth every 6 (six) hours as needed. 20 tablet 0  . indapamide (LOZOL) 2.5 MG tablet TAKE 1 TABLET(2.5 MG) BY MOUTH EVERY MORNING 90 tablet 1  . Insulin Pen Needle 32G X 4 MM MISC Use with Victoza as directed 100 each 1  . levothyroxine (SYNTHROID, LEVOTHROID) 88 MCG tablet Take 1/2 tablet by mouth on Monday and take 1 tablet on all other days (change in directions) 84 tablet 0  . liraglutide (VICTOZA) 18 MG/3ML SOPN INJECT 1.2 MG INTO THE SKIN DAILY 6 pen 1  . losartan (COZAAR) 100 MG tablet TAKE 1 TABLET BY MOUTH EVERY DAY 90 tablet 1  . Magnesium 250 MG TABS Take 250 mg by mouth daily.     . Multiple Vitamin (MULTIVITAMIN) tablet Take 1 tablet by mouth daily.    Marland Kitchen omeprazole (PRILOSEC) 40 MG capsule Take 1 capsule by mouth every day 90 capsule 1  . pravastatin (PRAVACHOL) 40 MG tablet Take 1 tablet by mouth every day 90 tablet 0  . Simethicone (GAS-X PO) Take by mouth as needed.    . zolpidem (AMBIEN) 5 MG tablet Take 1 tablet (5 mg total) at bedtime as needed by mouth for sleep. 30 tablet 5   No current facility-administered medications for this visit.     REVIEW OF SYSTEMS:    Constitutional: Denies fevers, chills or abnormal night sweats Eyes: Denies blurriness of vision, double vision or watery eyes Ears, nose, mouth, throat, and face: Denies mucositis or sore throat Respiratory: Denies cough, dyspnea or wheezes Cardiovascular: Denies palpitation, chest discomfort or lower extremity swelling Gastrointestinal:  Denies nausea, heartburn or change in bowel habits Skin: Denies abnormal skin rashes Lymphatics: Denies new lymphadenopathy or easy bruising Neurological:Denies numbness, tingling or new weaknesses Behavioral/Psych: Mood is stable, no new changes  All other systems were reviewed with the patient and are negative.  PHYSICAL EXAMINATION: ECOG PERFORMANCE STATUS: 1 - Symptomatic but completely ambulatory  Vitals:   12/10/18 1325  BP: 102/60  Pulse: (!) 109  Resp: 20  Temp: 98.2 F (36.8 C)  SpO2: 100%   Filed Weights   12/10/18 1325  Weight: 157 lb 8 oz (71.4 kg)    GENERAL:alert, no distress and comfortable SKIN: skin color, texture, turgor are normal, no rashes or significant lesions EYES: normal, conjunctiva are pink and non-injected, sclera clear OROPHARYNX:no exudate, no erythema and lips, buccal mucosa, and tongue normal  NECK: supple, thyroid normal size, non-tender, without nodularity LYMPH:  no palpable lymphadenopathy in the cervical, axillary or inguinal LUNGS: clear to auscultation and percussion with normal breathing effort HEART: regular rate & rhythm and no murmurs and no lower extremity edema ABDOMEN:abdomen soft, non-tender and normal bowel sounds Musculoskeletal:no cyanosis of digits and no clubbing  PSYCH: alert & oriented x 3 with fluent speech NEURO: no focal motor/sensory deficits  LABORATORY DATA:  I have reviewed the data as listed Recent Results (from the past 2160 hour(s))  POCT hemoglobin     Status: Abnormal   Collection Time: 11/13/18 11:33 AM  Result Value Ref Range   Hemoglobin 9.2 (A) 11 - 14.6 g/dL   CBC  with Differential     Status: Abnormal   Collection Time: 11/13/18 12:05 PM  Result Value Ref Range   WBC 6.6 3.4 - 10.8 x10E3/uL   RBC 4.04 3.77 - 5.28 x10E6/uL   Hemoglobin 10.1 (L) 11.1 - 15.9 g/dL   Hematocrit 32.5 (L) 34.0 - 46.6 %   MCV 80 79 - 97 fL   MCH 25.0 (L) 26.6 - 33.0 pg   MCHC 31.1 (L) 31.5 - 35.7 g/dL   RDW 16.5 (H) 11.7 - 15.4 %   Platelets 366 150 - 450 x10E3/uL   Neutrophils 54 Not Estab. %   Lymphs 36 Not Estab. %   Monocytes 7 Not Estab. %   Eos 1 Not Estab. %   Basos 1 Not Estab. %   Neutrophils Absolute 3.6 1.4 - 7.0 x10E3/uL   Lymphocytes Absolute 2.4 0.7 - 3.1 x10E3/uL   Monocytes Absolute 0.4 0.1 - 0.9 x10E3/uL   EOS (ABSOLUTE) 0.1 0.0 - 0.4 x10E3/uL   Basophils Absolute 0.1 0.0 - 0.2 x10E3/uL   Immature Granulocytes 1 Not Estab. %   Immature Grans (Abs) 0.0 0.0 - 0.1 x10E3/uL  Ferritin     Status: None   Collection Time: 11/13/18 12:05 PM  Result Value Ref Range   Ferritin 20 15 - 150 ng/mL  Iron Binding Cap (TIBC)([facility])     Status: Abnormal   Collection Time: 11/13/18 12:05 PM  Result Value Ref Range   Total Iron Binding Capacity 366 250 - 450 ug/dL   UIBC 317 118 - 369 ug/dL   Iron 49 27 - 139 ug/dL   Iron Saturation 13 (L) 15 - 55 %  Retic     Status: None   Collection Time: 11/13/18 12:05 PM  Result Value Ref Range   Retic Ct Pct 1.1 0.6 - 2.6 %  CBC with Differential/Platelet     Status: Abnormal (Preliminary result)   Collection Time: 12/10/18  2:40 PM  Result Value Ref Range   WBC 7.4 4.0 - 10.5 K/uL   RBC 4.10 3.87 - 5.11 MIL/uL   Hemoglobin 10.0 (L) 12.0 - 15.0 g/dL   HCT 34.0 (L) 36.0 - 46.0 %   MCV 82.9 80.0 - 100.0 fL   MCH 24.4 (L) 26.0 - 34.0 pg   MCHC 29.4 (L) 30.0 - 36.0 g/dL   RDW 17.2 (H) 11.5 - 15.5 %   Platelets 319 150 - 400 K/uL   nRBC 0.0 0.0 - 0.2 %    Comment: Performed at [facility], [address] [place], [place] [address]   Neutrophils Relative % PENDING %   Neutro Abs PENDING 1.7 - 7.7  K/uL   Band Neutrophils PENDING %   Lymphocytes Relative PENDING %   Lymphs Abs PENDING 0.7 - 4.0 K/uL   Monocytes Relative PENDING %   Monocytes Absolute PENDING 0.1 - 1.0 K/uL   Eosinophils Relative PENDING %   Eosinophils Absolute PENDING 0.0 - 0.5 K/uL   Basophils Relative PENDING %   Basophils Absolute PENDING 0.0 - 0.1 K/uL   WBC Morphology PENDING    RBC Morphology PENDING    Smear Review PENDING    Other PENDING %   nRBC PENDING 0 /100 WBC   Metamyelocytes Relative PENDING %   Myelocytes PENDING %   Promyelocytes Relative PENDING %   Blasts PENDING %  Save Smear (SSMR)     Status: None   Collection Time: 12/10/18  2:40 PM  Result Value Ref Range   Smear Review  SMEAR STAINED AND AVAILABLE FOR REVIEW     Comment: Performed at [facility], [address] [place], [place] [address]  Reticulocytes     Status: None   Collection Time: 12/10/18  2:40 PM  Result Value Ref Range   Retic Ct Pct 1.2 0.4 - 3.1 %   RBC. 4.10 3.87 - 5.11 MIL/uL   Retic Count, Absolute 49.2 19.0 - 186.0 K/uL   Immature Retic Fract 12.0 2.3 - 15.9 %    Comment: Performed at [facility], [address], [place], [place] [address]    RADIOGRAPHIC STUDIES: I have personally reviewed the radiological images as listed and agreed with the findings in the report. Dg Foot Complete Left  Result Date: 11/16/2018 CLINICAL DATA:  Patient dropped a jar on her foot on Thursday. Pain is along the outer lateral edge of the foot. EXAM: LEFT FOOT - COMPLETE 3+ VIEW COMPARISON:  None. FINDINGS: Remote healed fracture deformity of the left fifth metatarsal neck is identified. Likewise there appears to be an age-indeterminate fracture deformity of the anterolateral aspect of the cuboid. There is mild soft tissue swelling along the lateral aspect of the forefoot. No joint dislocation or suspicious osseous lesions. The tibiotalar and subtalar joints are maintained. IMPRESSION: 1. Remote healed fracture deformity of the left  fifth metatarsal neck. 2. Age-indeterminate fracture deformity of the anterolateral aspect of the cuboid. 3. Mild soft tissue swelling is noted along the lateral aspect of the forefoot. Electronically Signed   By: Ashley Royalty M.D.   On: 11/16/2018 02:24  I have reviewed Francene Finders, NP's note and agree with the documentation.  I personally performed a face-to-face visit, made revisions and my assessment and plan is as follows.   ASSESSMENT & PLAN:  IDA (iron deficiency anemia) 1.  Iron deficiency anemia: - Secondary to chronic GI blood loss from small bowel AVMs. - Colonoscopy on 07/03/2014 shows normal mucosa in the terminal ileum, severe diverticulosis throughout the entire examined colon, small internal hemorrhoids.  Pathology showed hyperplastic rectal polyp.  Benign small bowel and gastric mucosa. -EGD on 07/03/2014 shows no obvious source of bleeding.  Moderate nonerosive gastritis.  She also underwent a capsule study in 2016. -Last Feraheme infusion was on 08/27/2014 and 05/03/2014. -CBC on 11/13/2018 shows hemoglobin of 10.1.  MCV was 80.  Ferritin was 20.  Percent saturation was 13.  Patient reports ice pica. -Denies any bleeding per rectum or melena.  She denies severe lack of energy.  She could not tolerate oral iron because of constipation. -She was evaluated by Roseanne Kaufman and was ordered to have Feraheme tomorrow. -We will do work-up for her anemia including reticulocyte count, LDH, Coombs test, SPEP, L84, folic acid, copper, haptoglobin.  She is reportedly being planned for another colonoscopy soon. -We will schedule her for another Feraheme infusion 1 week from tomorrow.  2.  Smoking history: -Patient current active smoker, smokes half pack a day for the last 50 years. -We will also order a low-dose CT scan for lung cancer screening. -I will see her back in 2 to 3 weeks to discuss results.  3.  Health maintenance: -Mammogram on 03/05/2018 was BI-RADS Category 1.  4.  Weight  loss: -She has unexplained weight loss of 47 pounds in the last 1 year.  She reports having lost appetite. - We will consider doing a CT of the abdomen and pelvis after her CT of the chest and colonoscopy.     All questions were answered. The patient  knows to call the clinic with any problems, questions or concerns.    [name], MD 12/10/18 3:09 PM

## 2018-12-10 NOTE — Assessment & Plan Note (Addendum)
1.  Iron deficiency anemia: - Secondary to chronic GI blood loss from small bowel AVMs. - Colonoscopy on 07/03/2014 shows normal mucosa in the terminal ileum, severe diverticulosis throughout the entire examined colon, small internal hemorrhoids.  Pathology showed hyperplastic rectal polyp.  Benign small bowel and gastric mucosa. -EGD on 07/03/2014 shows no obvious source of bleeding.  Moderate nonerosive gastritis.  She also underwent a capsule study in 2016. -Last Feraheme infusion was on 08/27/2014 and 05/03/2014. -CBC on 11/13/2018 shows hemoglobin of 10.1.  MCV was 80.  Ferritin was 20.  Percent saturation was 13.  Patient reports ice pica. -Denies any bleeding per rectum or melena.  She denies severe lack of energy.  She could not tolerate oral iron because of constipation. -She was evaluated by Roseanne Kaufman and was ordered to have Feraheme tomorrow. -We will do work-up for her anemia including reticulocyte count, LDH, Coombs test, SPEP, B35, folic acid, copper, haptoglobin.  She is reportedly being planned for another colonoscopy soon. -We will schedule her for another Feraheme infusion 1 week from tomorrow.  2.  Smoking history: -Patient current active smoker, smokes half pack a day for the last 50 years. -We will also order a low-dose CT scan for lung cancer screening. -I will see her back in 2 to 3 weeks to discuss results.  3.  Health maintenance: -Mammogram on 03/05/2018 was BI-RADS Category 1.  4.  Weight loss: -She has unexplained weight loss of 47 pounds in the last 1 year.  She reports having lost appetite. - We will consider doing a CT of the abdomen and pelvis after her CT of the chest and colonoscopy.

## 2018-12-11 ENCOUNTER — Telehealth: Payer: Self-pay

## 2018-12-11 ENCOUNTER — Encounter (HOSPITAL_COMMUNITY)
Admission: RE | Admit: 2018-12-11 | Discharge: 2018-12-11 | Disposition: A | Discharge status: Home / Self Care | Payer: PPO | Source: Ambulatory Visit | Attending: Gastroenterology | Admitting: Gastroenterology

## 2018-12-11 ENCOUNTER — Encounter (HOSPITAL_COMMUNITY): Payer: Self-pay

## 2018-12-11 ENCOUNTER — Other Ambulatory Visit: Payer: Self-pay

## 2018-12-11 DIAGNOSIS — D509 Iron deficiency anemia, unspecified: Secondary | ICD-10-CM | POA: Insufficient documentation

## 2018-12-11 DIAGNOSIS — D500 Iron deficiency anemia secondary to blood loss (chronic): Secondary | ICD-10-CM

## 2018-12-11 LAB — VITAMIN D 25 HYDROXY (VIT D DEFICIENCY, FRACTURES): Vit D, 25-Hydroxy: 20.1 ng/mL — ABNORMAL LOW (ref 30.0–100.0)

## 2018-12-11 LAB — HAPTOGLOBIN: Haptoglobin: 238 mg/dL (ref 37–355)

## 2018-12-11 MED ORDER — SODIUM CHLORIDE 0.9 % IV SOLN
Freq: Once | INTRAVENOUS | Status: AC
  Administered 2018-12-11: 11:00:00 via INTRAVENOUS

## 2018-12-11 MED ORDER — SODIUM CHLORIDE 0.9 % IV SOLN
510.0000 mg | Freq: Once | INTRAVENOUS | Status: AC
  Administered 2018-12-11: 510 mg via INTRAVENOUS
  Filled 2018-12-11: qty 17

## 2018-12-11 MED ORDER — PEG 3350-KCL-NA BICARB-NACL 420 G PO SOLR: 4000.0000 mL | ORAL | 0 refills | Status: DC

## 2018-12-11 NOTE — Telephone Encounter (Signed)
Noted. Procedure instructions mailed to pt.

## 2018-12-11 NOTE — Telephone Encounter (Signed)
Called pt, TCS/-/+EGD w/SLF scheduled for 02/28/19 at 10:45am. Orders entered. Rx for prep sent to pharmacy.  [name], please advise diabetic medication adjustment prior to procedure.

## 2018-12-11 NOTE — Telephone Encounter (Signed)
Hold Victoza morning of procedure.

## 2018-12-12 DIAGNOSIS — M79672 Pain in left foot: Secondary | ICD-10-CM | POA: Diagnosis not present

## 2018-12-12 LAB — PROTEIN ELECTROPHORESIS, SERUM
A/G Ratio: 1.2 (ref 0.7–1.7)
Albumin ELP: 3.7 g/dL (ref 2.9–4.4)
Alpha-1-Globulin: 0.3 g/dL (ref 0.0–0.4)
Alpha-2-Globulin: 0.9 g/dL (ref 0.4–1.0)
Beta Globulin: 1.1 g/dL (ref 0.7–1.3)
Gamma Globulin: 0.7 g/dL (ref 0.4–1.8)
Globulin, Total: 3 g/dL (ref 2.2–3.9)
Total Protein ELP: 6.7 g/dL (ref 6.0–8.5)

## 2018-12-12 LAB — COPPER, SERUM: Copper: 124 ug/dL (ref 72–166)

## 2018-12-18 ENCOUNTER — Inpatient Hospital Stay (HOSPITAL_COMMUNITY): Payer: PPO | Attending: Hematology

## 2018-12-18 ENCOUNTER — Encounter (HOSPITAL_COMMUNITY): Payer: Self-pay

## 2018-12-18 VITALS — BP 115/68 | HR 78 | Temp 97.9°F | Resp 18

## 2018-12-18 DIAGNOSIS — E611 Iron deficiency: Secondary | ICD-10-CM

## 2018-12-18 DIAGNOSIS — Z720 Tobacco use: Secondary | ICD-10-CM | POA: Insufficient documentation

## 2018-12-18 DIAGNOSIS — D509 Iron deficiency anemia, unspecified: Secondary | ICD-10-CM | POA: Diagnosis not present

## 2018-12-18 DIAGNOSIS — R634 Abnormal weight loss: Secondary | ICD-10-CM | POA: Diagnosis not present

## 2018-12-18 MED ORDER — SODIUM CHLORIDE 0.9% FLUSH
10.0000 mL | Freq: Once | INTRAVENOUS | Status: AC
  Administered 2018-12-18: 10 mL via INTRAVENOUS

## 2018-12-18 MED ORDER — SODIUM CHLORIDE 0.9 % IV SOLN
INTRAVENOUS | Status: DC
  Administered 2018-12-18: 09:00:00 via INTRAVENOUS

## 2018-12-18 MED ORDER — SODIUM CHLORIDE 0.9 % IV SOLN
510.0000 mg | Freq: Once | INTRAVENOUS | Status: AC
  Administered 2018-12-18: 510 mg via INTRAVENOUS
  Filled 2018-12-18: qty 17

## 2018-12-18 NOTE — Patient Instructions (Signed)
[facility] at [facility]  Discharge Instructions:   _______________________________________________________________  Thank you for choosing [facility] at [facility] to provide your oncology and hematology care.  To afford each patient quality time with our providers, please arrive at least 15 minutes before your scheduled appointment.  You need to re-schedule your appointment if you arrive 10 or more minutes late.  We strive to give you quality time with our providers, and arriving late affects you and other patients whose appointments are after yours.  Also, if you no show three or more times for appointments you may be dismissed from the clinic.  Again, thank you for choosing [facility] at [facility]. Our hope is that these requests will allow you access to exceptional care and in a timely manner. _______________________________________________________________  If you have questions after your visit, please contact our office at [phone] between the hours of 8:30 a.m. and 5:00 p.m. Voicemails left after 4:30 p.m. will not be returned until the following business day. _______________________________________________________________  For prescription refill requests, have your pharmacy contact our office. _______________________________________________________________  Recommendations made by the consultant and any test results will be sent to your referring physician. _______________________________________________________________ 

## 2018-12-18 NOTE — Progress Notes (Signed)
Patient tolerated iron infusion with no complaints voiced.  Peripheral IV site with good blood return noted before and after treatment.  No bruising or swelling note at site.  Band aid applied.  Patient left ambulatory with VSS and no s/s of distress noted.

## 2018-12-19 ENCOUNTER — Other Ambulatory Visit (HOSPITAL_COMMUNITY): Payer: Self-pay | Admitting: Nurse Practitioner

## 2018-12-19 ENCOUNTER — Encounter: Payer: Self-pay | Admitting: Gastroenterology

## 2018-12-19 ENCOUNTER — Ambulatory Visit (INDEPENDENT_AMBULATORY_CARE_PROVIDER_SITE_OTHER): Payer: PPO | Admitting: Gastroenterology

## 2018-12-19 ENCOUNTER — Ambulatory Visit (HOSPITAL_COMMUNITY)
Admission: RE | Admit: 2018-12-19 | Discharge: 2018-12-19 | Disposition: A | Discharge status: Home / Self Care | Payer: PPO | Source: Ambulatory Visit | Attending: Nurse Practitioner | Admitting: Nurse Practitioner

## 2018-12-19 ENCOUNTER — Encounter (HOSPITAL_COMMUNITY): Payer: Self-pay

## 2018-12-19 DIAGNOSIS — Z122 Encounter for screening for malignant neoplasm of respiratory organs: Secondary | ICD-10-CM

## 2018-12-19 DIAGNOSIS — D509 Iron deficiency anemia, unspecified: Secondary | ICD-10-CM

## 2018-12-19 LAB — IFOBT (OCCULT BLOOD): IFOBT: NEGATIVE

## 2018-12-19 NOTE — Progress Notes (Signed)
Patient was sent for her low dose CT scan but told them she has only been smoking for 25 years not 30 so they cancelled her scan.

## 2018-12-20 NOTE — Progress Notes (Signed)
Heme negative. Continues with plans for TCS/EGD for now.

## 2018-12-20 NOTE — Progress Notes (Signed)
PT is aware.

## 2018-12-25 ENCOUNTER — Other Ambulatory Visit: Payer: Self-pay

## 2018-12-25 ENCOUNTER — Ambulatory Visit (INDEPENDENT_AMBULATORY_CARE_PROVIDER_SITE_OTHER): Payer: PPO | Admitting: Family Medicine

## 2018-12-25 ENCOUNTER — Encounter: Payer: Self-pay | Admitting: Family Medicine

## 2018-12-25 VITALS — BP 122/86 | Ht 62.0 in | Wt 157.0 lb

## 2018-12-25 DIAGNOSIS — M8580 Other specified disorders of bone density and structure, unspecified site: Secondary | ICD-10-CM | POA: Diagnosis not present

## 2018-12-25 DIAGNOSIS — M7542 Impingement syndrome of left shoulder: Secondary | ICD-10-CM

## 2018-12-25 DIAGNOSIS — I10 Essential (primary) hypertension: Secondary | ICD-10-CM | POA: Diagnosis not present

## 2018-12-25 DIAGNOSIS — N289 Disorder of kidney and ureter, unspecified: Secondary | ICD-10-CM

## 2018-12-25 DIAGNOSIS — M542 Cervicalgia: Secondary | ICD-10-CM

## 2018-12-25 DIAGNOSIS — G4709 Other insomnia: Secondary | ICD-10-CM

## 2018-12-25 DIAGNOSIS — Z1159 Encounter for screening for other viral diseases: Secondary | ICD-10-CM | POA: Diagnosis not present

## 2018-12-25 DIAGNOSIS — E039 Hypothyroidism, unspecified: Secondary | ICD-10-CM | POA: Diagnosis not present

## 2018-12-25 DIAGNOSIS — M109 Gout, unspecified: Secondary | ICD-10-CM

## 2018-12-25 DIAGNOSIS — R7303 Prediabetes: Secondary | ICD-10-CM | POA: Diagnosis not present

## 2018-12-25 DIAGNOSIS — Z780 Asymptomatic menopausal state: Secondary | ICD-10-CM

## 2018-12-25 DIAGNOSIS — R29898 Other symptoms and signs involving the musculoskeletal system: Secondary | ICD-10-CM | POA: Diagnosis not present

## 2018-12-25 DIAGNOSIS — E7849 Other hyperlipidemia: Secondary | ICD-10-CM

## 2018-12-25 MED ORDER — ALBUTEROL SULFATE HFA 108 (90 BASE) MCG/ACT IN AERS: 2.0000 | INHALATION_SPRAY | RESPIRATORY_TRACT | 5 refills | Status: DC | PRN

## 2018-12-25 MED ORDER — LEVOTHYROXINE SODIUM 88 MCG PO TABS: ORAL_TABLET | ORAL | 0 refills | Status: DC

## 2018-12-25 MED ORDER — LIRAGLUTIDE 18 MG/3ML ~~LOC~~ SOPN: PEN_INJECTOR | SUBCUTANEOUS | 1 refills | Status: DC

## 2018-12-25 MED ORDER — AMOXICILLIN 500 MG PO CAPS: 500.0000 mg | ORAL_CAPSULE | Freq: Three times a day (TID) | ORAL | 0 refills | Status: DC

## 2018-12-25 MED ORDER — PRAVASTATIN SODIUM 40 MG PO TABS: 40.0000 mg | ORAL_TABLET | Freq: Every day | ORAL | 1 refills | Status: DC

## 2018-12-25 NOTE — Progress Notes (Signed)
Subjective:    Patient ID: Joanna [name], female    DOB: Nov 25, 1950, 68 y.o.   MRN: [id number]  Hypertension  This is a chronic problem. The current episode started more than 1 year ago. Pertinent negatives include no chest pain or shortness of breath. Risk factors for coronary artery disease include post-menopausal state and dyslipidemia. Treatments tried: norvasc, lozol. There are no compliance problems.    Patient reports she is still having problems with her left foot and hand. Patient unfortunately fractured 2 bones in her foot when heavy can fell on it  Patient having left hand weakness and stiffness difficulty using it she was seen by specialist in [facility] had her see another specialist and they recommended some exercises and if it persisted to be rechecked  Patient has thyroid condition.  Takes thyroid medication on a regular basis.  States that the proper way.  Relates compliance.  States no negative side effects.  States condition seems to be under good control.  Patient here for follow-up regarding cholesterol.  The patient does have hyperlipidemia.  Patient does try to maintain a reasonable diet.  Patient does take the medication on a regular basis.  Denies missing a dose.  The patient denies any obvious side effects.  Prior blood work results reviewed with the patient.  The patient is aware of his cholesterol goals and the need to keep it under good control to lessen the risk of disease.  History renal insufficiency avoids salt tries to drink plenty of liquids  Insomnia uses melatonin.  I told her that it is no longer good idea to use Ambien  Cervical pain and discomfort intermittently we will do x-ray given her left hand weakness as well as progressive troubles will need an MRI  Patient did have a gout attack several days ago would like to have that looked at closer  She is postmenopausal needs bone density Review of Systems  Constitutional: Negative for activity change,  appetite change and fatigue.  HENT: Negative for congestion and rhinorrhea.   Respiratory: Negative for cough and shortness of breath.   Cardiovascular: Negative for chest pain and leg swelling.  Gastrointestinal: Negative for abdominal pain and diarrhea.  Endocrine: Negative for polydipsia and polyphagia.  Skin: Negative for color change.  Neurological: Negative for dizziness and weakness.  Psychiatric/Behavioral: Negative for behavioral problems and confusion.       Objective:   Physical Exam Vitals signs reviewed.  Constitutional:      General: She is not in acute distress. HENT:     Head: Normocephalic and atraumatic.  Eyes:     General:        Right eye: No discharge.        Left eye: No discharge.  Neck:     Trachea: No tracheal deviation.  Cardiovascular:     Rate and Rhythm: Normal rate and regular rhythm.     Heart sounds: Normal heart sounds. No murmur.  Pulmonary:     Effort: Pulmonary effort is normal. No respiratory distress.     Breath sounds: Normal breath sounds.  Lymphadenopathy:     Cervical: No cervical adenopathy.  Skin:    General: Skin is warm and dry.  Neurological:     Mental Status: She is alert.     Coordination: Coordination normal.  Psychiatric:        Behavior: Behavior normal.    Has significant impingement of the left shoulder will need referral to orthopedics  Has left hand weakness  that has evidence of upper motor neuron issue will need cervical spine as well as MRI may need consultation with neurology       Assessment & Plan:  Blood pressure good control continue current measures watch diet watch salt take medicines  Hypothyroidism take medicine every morning check lab work await the results previous labs reviewed  Hyperlipidemia take medicine watch diet continue healthy diet follow-up if ongoing troubles and previous labs reviewed new labs ordered  Renal insufficiency lab work ordered will watch diet avoid excessive salt   Insomnia may use melatonin as needed  Left hand weakness it is concerning.  X-ray of the neck then will need to have MRI she is already had nerve conduction studies of the hands which did show minimal carpal tunnel the issues she has going on appears to be potentially upper motor neuron-there could be myelopathy of the cervical spine is also possible this could be ALS this will need very close looking into  She will check A1c has had history of sugar related issues in the past  Due for a bone density  25 minutes was spent with the patient.  This statement verifies that 25 minutes was indeed spent with the patient.  More than 50% of this visit-total duration of the visit-was spent in counseling and coordination of care. The issues that the patient came in for today as reflected in the diagnosis (s) please refer to documentation for further details.

## 2018-12-25 NOTE — Patient Instructions (Signed)
Shoulder Exercises Ask your health care provider which exercises are safe for you. Do exercises exactly as told by your health care provider and adjust them as directed. It is normal to feel mild stretching, pulling, tightness, or discomfort as you do these exercises, but you should stop right away if you feel sudden pain or your pain gets worse.Do not begin these exercises until told by your health care provider. Range of Motion Exercises        These exercises warm up your muscles and joints and improve the movement and flexibility of your shoulder. These exercises also help to relieve pain, numbness, and tingling. These exercises involve stretching your injured shoulder directly. Exercise A: Pendulum 1. Stand near a wall or a surface that you can hold onto for balance. 2. Bend at the waist and let your left / right arm hang straight down. Use your other arm to support you. Keep your back straight and do not lock your knees. 3. Relax your left / right arm and shoulder muscles, and move your hips and your trunk so your left / right arm swings freely. Your arm should swing because of the motion of your body, not because you are using your arm or shoulder muscles. 4. Keep moving your body so your arm swings in the following directions, as told by your health care provider: ? Side to side. ? Forward and backward. ? In clockwise and counterclockwise circles. 5. Continue each motion for __________ seconds, or for as long as told by your health care provider. 6. Slowly return to the starting position. Repeat __________ times. Complete this exercise __________ times a day. Exercise B:Flexion, Standing 1. Stand and hold a broomstick, a cane, or a similar object. Place your hands a little more than shoulder-width apart on the object. Your left / right hand should be palm-up, and your other hand should be palm-down. 2. Keep your elbow straight and keep your shoulder muscles relaxed. Push the stick  down with your healthy arm to raise your left / right arm in front of your body, and then over your head until you feel a stretch in your shoulder. ? Avoid shrugging your shoulder while you raise your arm. Keep your shoulder blade tucked down toward the middle of your back. 3. Hold for __________ seconds. 4. Slowly return to the starting position. Repeat __________ times. Complete this exercise __________ times a day. Exercise C: Abduction, Standing 1. Stand and hold a broomstick, a cane, or a similar object. Place your hands a little more than shoulder-width apart on the object. Your left / right hand should be palm-up, and your other hand should be palm-down. 2. While keeping your elbow straight and your shoulder muscles relaxed, push the stick across your body toward your left / right side. Raise your left / right arm to the side of your body and then over your head until you feel a stretch in your shoulder. ? Do not raise your arm above shoulder height, unless your health care provider tells you to do that. ? Avoid shrugging your shoulder while you raise your arm. Keep your shoulder blade tucked down toward the middle of your back. 3. Hold for __________ seconds. 4. Slowly return to the starting position. Repeat __________ times. Complete this exercise __________ times a day. Exercise D:Internal Rotation 1. Place your left / right hand behind your back, palm-up. 2. Use your other hand to dangle an exercise band, a towel, or a similar object over your shoulder.   Grasp the band with your left / right hand so you are holding onto both ends. 3. Gently pull up on the band until you feel a stretch in the front of your left / right shoulder. ? Avoid shrugging your shoulder while you raise your arm. Keep your shoulder blade tucked down toward the middle of your back. 4. Hold for __________ seconds. 5. Release the stretch by letting go of the band and lowering your hands. Repeat __________ times.  Complete this exercise __________ times a day. Stretching Exercises  These exercises warm up your muscles and joints and improve the movement and flexibility of your shoulder. These exercises also help to relieve pain, numbness, and tingling. These exercises are done using your healthy shoulder to help stretch the muscles of your injured shoulder. Exercise E: Corner Stretch (External Rotation and Abduction) 1. Stand in a doorway with one of your feet slightly in front of the other. This is called a staggered stance. If you cannot reach your forearms to the door frame, stand facing a corner of a room. 2. Choose one of the following positions as told by your health care provider: ? Place your hands and forearms on the door frame above your head. ? Place your hands and forearms on the door frame at the height of your head. ? Place your hands on the door frame at the height of your elbows. 3. Slowly move your weight onto your front foot until you feel a stretch across your chest and in the front of your shoulders. Keep your head and chest upright and keep your abdominal muscles tight. 4. Hold for __________ seconds. 5. To release the stretch, shift your weight to your back foot. Repeat __________ times. Complete this stretch __________ times a day. Exercise F:Extension, Standing 1. Stand and hold a broomstick, a cane, or a similar object behind your back. ? Your hands should be a little wider than shoulder-width apart. ? Your palms should face away from your back. 2. Keeping your elbows straight and keeping your shoulder muscles relaxed, move the stick away from your body until you feel a stretch in your shoulder. ? Avoid shrugging your shoulders while you move the stick. Keep your shoulder blade tucked down toward the middle of your back. 3. Hold for __________ seconds. 4. Slowly return to the starting position. Repeat __________ times. Complete this exercise __________ times a  day. Strengthening Exercises           These exercises build strength and endurance in your shoulder. Endurance is the ability to use your muscles for a long time, even after they get tired. Exercise G:External Rotation 1. Sit in a stable chair without armrests. 2. Secure an exercise band at elbow height on your left / right side. 3. Place a soft object, such as a folded towel or a small pillow, between your left / right upper arm and your body to move your elbow a few inches away (about 10 cm) from your side. 4. Hold the end of the band so it is tight and there is no slack. 5. Keeping your elbow pressed against the soft object, move your left / right forearm out, away from your abdomen. Keep your body steady so only your forearm moves. 6. Hold for __________ seconds. 7. Slowly return to the starting position. Repeat __________ times. Complete this exercise __________ times a day. Exercise H:Shoulder Abduction 1. Sit in a stable chair without armrests, or stand. 2. Hold a __________ weight in your   left / right hand, or hold an exercise band with both hands. 3. Start with your arms straight down and your left / right palm facing in, toward your body. 4. Slowly lift your left / right hand out to your side. Do not lift your hand above shoulder height unless your health care provider tells you that this is safe. ? Keep your arms straight. ? Avoid shrugging your shoulder while you do this movement. Keep your shoulder blade tucked down toward the middle of your back. 5. Hold for __________ seconds. 6. Slowly lower your arm, and return to the starting position. Repeat __________ times. Complete this exercise __________ times a day. Exercise I:Shoulder Extension 1. Sit in a stable chair without armrests, or stand. 2. Secure an exercise band to a stable object in front of you where it is at shoulder height. 3. Hold one end of the exercise band in each hand. Your palms should face each  other. 4. Straighten your elbows and lift your hands up to shoulder height. 5. Step back, away from the secured end of the exercise band, until the band is tight and there is no slack. 6. Squeeze your shoulder blades together as you pull your hands down to the sides of your thighs. Stop when your hands are straight down by your sides. Do not let your hands go behind your body. 7. Hold for __________ seconds. 8. Slowly return to the starting position. Repeat __________ times. Complete this exercise __________ times a day. Exercise J:Standing Shoulder Row 1. Sit in a stable chair without armrests, or stand. 2. Secure an exercise band to a stable object in front of you so it is at waist height. 3. Hold one end of the exercise band in each hand. Your palms should be in a thumbs-up position. 4. Bend each of your elbows to an "L" shape (about 90 degrees) and keep your upper arms at your sides. 5. Step back until the band is tight and there is no slack. 6. Slowly pull your elbows back behind you. 7. Hold for __________ seconds. 8. Slowly return to the starting position. Repeat __________ times. Complete this exercise __________ times a day. Exercise K:Shoulder Press-Ups 1. Sit in a stable chair that has armrests. Sit upright, with your feet flat on the floor. 2. Put your hands on the armrests so your elbows are bent and your fingers are pointing forward. Your hands should be about even with the sides of your body. 3. Push down on the armrests and use your arms to lift yourself off of the chair. Straighten your elbows and lift yourself up as much as you comfortably can. ? Move your shoulder blades down, and avoid letting your shoulders move up toward your ears. ? Keep your feet on the ground. As you get stronger, your feet should support less of your body weight as you lift yourself up. 4. Hold for __________ seconds. 5. Slowly lower yourself back into the chair. Repeat __________ times. Complete  this exercise __________ times a day. Exercise L: Wall Push-Ups 1. Stand so you are facing a stable wall. Your feet should be about one arm-length away from the wall. 2. Lean forward and place your palms on the wall at shoulder height. 3. Keep your feet flat on the floor as you bend your elbows and lean forward toward the wall. 4. Hold for __________ seconds. 5. Straighten your elbows to push yourself back to the starting position. Repeat __________ times. Complete this exercise __________ times   a day. This information is not intended to replace advice given to you by your health care provider. Make sure you discuss any questions you have with your health care provider. Document Released: 08/16/2005 Document Revised: 02/05/2018 Document Reviewed: 06/13/2015 Elsevier Interactive Patient Education  2019 [employer]  

## 2018-12-26 LAB — HEPATIC FUNCTION PANEL
ALT: 13 IU/L (ref 0–32)
AST: 16 IU/L (ref 0–40)
Albumin: 4.2 g/dL (ref 3.8–4.8)
Alkaline Phosphatase: 96 IU/L (ref 39–117)
Bilirubin Total: <0.2 mg/dL (ref 0.0–1.2)
Bilirubin, Direct: 0.06 mg/dL (ref 0.00–0.40)
Total Protein: 6.5 g/dL (ref 6.0–8.5)

## 2018-12-26 LAB — LIPID PANEL
Chol/HDL Ratio: 2.5 ratio (ref 0.0–4.4)
Cholesterol, Total: 140 mg/dL (ref 100–199)
HDL: 57 mg/dL (ref >39)
LDL Calculated: 60 mg/dL (ref 0–99)
Triglycerides: 115 mg/dL (ref 0–149)
VLDL Cholesterol Cal: 23 mg/dL (ref 5–40)

## 2018-12-26 LAB — MICROALBUMIN / CREATININE URINE RATIO
Creatinine, Urine: 190.8 mg/dL
Microalb/Creat Ratio: 55 mg/g creat — ABNORMAL HIGH (ref 0–29)
Microalbumin, Urine: 105.1 ug/mL

## 2018-12-26 LAB — HEPATITIS C ANTIBODY: Hep C Virus Ab: <0.1 s/co ratio (ref 0.0–0.9)

## 2018-12-26 LAB — BASIC METABOLIC PANEL
BUN/Creatinine Ratio: 11 — ABNORMAL LOW (ref 12–28)
BUN: 20 mg/dL (ref 8–27)
CO2: 24 mmol/L (ref 20–29)
Calcium: 10.1 mg/dL (ref 8.7–10.3)
Chloride: 107 mmol/L — ABNORMAL HIGH (ref 96–106)
Creatinine, Ser: 1.86 mg/dL — ABNORMAL HIGH (ref 0.57–1.00)
GFR calc Af Amer: 32 mL/min/{1.73_m2} — ABNORMAL LOW (ref >59)
GFR calc non Af Amer: 28 mL/min/{1.73_m2} — ABNORMAL LOW (ref >59)
Glucose: 91 mg/dL (ref 65–99)
Potassium: 3.1 mmol/L — ABNORMAL LOW (ref 3.5–5.2)
Sodium: 150 mmol/L — ABNORMAL HIGH (ref 134–144)

## 2018-12-26 LAB — HEMOGLOBIN A1C
Est. average glucose Bld gHb Est-mCnc: 123 mg/dL
Hgb A1c MFr Bld: 5.9 % — ABNORMAL HIGH (ref 4.8–5.6)

## 2018-12-26 LAB — TSH: TSH: 0.203 u[IU]/mL — ABNORMAL LOW (ref 0.450–4.500)

## 2018-12-26 LAB — URIC ACID: Uric Acid: 9.3 mg/dL — ABNORMAL HIGH (ref 2.5–7.1)

## 2018-12-27 ENCOUNTER — Ambulatory Visit (HOSPITAL_COMMUNITY)
Admission: RE | Admit: 2018-12-27 | Discharge: 2018-12-27 | Disposition: A | Discharge status: Home / Self Care | Payer: PPO | Source: Ambulatory Visit | Attending: Family Medicine | Admitting: Family Medicine

## 2018-12-27 ENCOUNTER — Other Ambulatory Visit: Payer: Self-pay

## 2018-12-27 DIAGNOSIS — M542 Cervicalgia: Secondary | ICD-10-CM | POA: Diagnosis not present

## 2018-12-27 DIAGNOSIS — M7542 Impingement syndrome of left shoulder: Secondary | ICD-10-CM | POA: Insufficient documentation

## 2018-12-30 ENCOUNTER — Other Ambulatory Visit: Payer: Self-pay | Admitting: *Deleted

## 2018-12-30 DIAGNOSIS — M542 Cervicalgia: Secondary | ICD-10-CM

## 2018-12-30 DIAGNOSIS — G959 Disease of spinal cord, unspecified: Secondary | ICD-10-CM

## 2019-01-01 ENCOUNTER — Encounter (HOSPITAL_COMMUNITY): Payer: Self-pay | Admitting: Hematology

## 2019-01-01 ENCOUNTER — Inpatient Hospital Stay (HOSPITAL_BASED_OUTPATIENT_CLINIC_OR_DEPARTMENT_OTHER): Payer: PPO | Admitting: Hematology

## 2019-01-01 ENCOUNTER — Other Ambulatory Visit: Payer: Self-pay

## 2019-01-01 VITALS — BP 121/81 | HR 89 | Temp 98.1°F | Resp 18 | Wt 156.8 lb

## 2019-01-01 DIAGNOSIS — N189 Chronic kidney disease, unspecified: Secondary | ICD-10-CM | POA: Diagnosis not present

## 2019-01-01 DIAGNOSIS — D509 Iron deficiency anemia, unspecified: Secondary | ICD-10-CM | POA: Diagnosis not present

## 2019-01-01 DIAGNOSIS — E611 Iron deficiency: Secondary | ICD-10-CM

## 2019-01-01 DIAGNOSIS — Z720 Tobacco use: Secondary | ICD-10-CM | POA: Diagnosis not present

## 2019-01-01 DIAGNOSIS — Q2730 Arteriovenous malformation, site unspecified: Secondary | ICD-10-CM

## 2019-01-01 DIAGNOSIS — D500 Iron deficiency anemia secondary to blood loss (chronic): Secondary | ICD-10-CM

## 2019-01-01 DIAGNOSIS — R634 Abnormal weight loss: Secondary | ICD-10-CM

## 2019-01-01 NOTE — Assessment & Plan Note (Signed)
1.  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 - Colonoscopy on 07/03/2014 shows normal mucosa in the terminal ileum, severe diverticulosis throughout the entire examined colon, small internal hemorrhoids.  Pathology showed hyperplastic rectal polyp.  Benign small bowel and gastric mucosa. -EGD on 07/03/2014 shows no obvious source of bleeding.  Moderate nonerosive gastritis.  She also underwent a capsule study in 2016. -Last Feraheme infusion was on 08/27/2014 and 05/03/2014. -CBC on 11/13/2018 shows hemoglobin of 10.1.  MCV was 80.  Ferritin was 20.  Percent saturation was 13.  Patient reports ice pica. -Denied any bleeding per rectum or melena.  She could not tolerate iron orally because of constipation.  - We reviewed her blood work.  Stool for occult blood was negative.  Ferritin was low at 11.  Y65, folic acid and copper were normal.  SPEP was negative.  LDH was normal. - She did receive Feraheme on 12/11/2018 and 12/18/2018. - She felt better after Feraheme infusion. -We will see her back in 2 weeks and recheck her labs.  2.  Smoking history: -She is a current active smoker, smokes approximately half pack per day for 50 years. -A low-dose CT scan was ordered but was declined due to insurance purposes.   3.  Health maintenance: -Mammogram on 03/05/2018 was BI-RADS Category 1.  4.  Weight loss: -He has unexplained weight loss of 47 pounds in less than a year.  She reports no appetite. -We will order a CT scan of the CAP with oral contrast.

## 2019-01-01 NOTE — Patient Instructions (Addendum)
[place] at [facility] Discharge Instructions  You were seen today by Dr. Delton Coombes. He went over your recent lab results, your vitamin D level was low and your kidney number was elevated. He would like you to start taking over the counter vitamin D 2000 units daily. He will get you scheduled for CT scans. He will see you back in 2-3 weeks for labs and follow up.   Thank you for choosing [place] at [facility] to provide your oncology and hematology care.  To afford each patient quality time with our provider, please arrive at least 15 minutes before your scheduled appointment time.   If you have a lab appointment with the [facility] please come in thru the  Main Entrance and check in at the main information desk  You need to re-schedule your appointment should you arrive 10 or more minutes late.  We strive to give you quality time with our providers, and arriving late affects you and other patients whose appointments are after yours.  Also, if you no show three or more times for appointments you may be dismissed from the clinic at the providers discretion.     Again, thank you for choosing [facility]  Our hope is that these requests will decrease the amount of time that you wait before being seen by our physicians.       _____________________________________________________________  Should you have questions after your visit to [facility], please contact our office at [phone] between the hours of 8:00 a.m. and 4:30 p.m.  Voicemails left after 4:00 p.m. will not be returned until the following business day.  For prescription refill requests, have your pharmacy contact our office and allow 72 hours.    Cancer Center Support Programs:   > Cancer Support Group  2nd Tuesday of the month 1pm-2pm, Journey Room

## 2019-01-01 NOTE — Progress Notes (Signed)
[facility] [address], [place] [address]   CLINIC:  Medical Oncology/Hematology  PCP:  [name], MD [address] [place] [address] [phone]   REASON FOR VISIT:  Follow-up for  Anemia      INTERVAL HISTORY:  Joanna [name] 68 y.o. female returns for routine follow-up and consideration for next cycle of chemotherapy. She is here today alone. She states that she felt better after the iron infusion. She continues to smoke. Denies any nausea, vomiting, or diarrhea. Denies any new pains. Had not noticed any recent bleeding such as epistaxis, hematuria or hematochezia. Denies recent chest pain on exertion, shortness of breath on minimal exertion, pre-syncopal episodes, or palpitations. Denies any numbness or tingling in hands or feet. Denies any recent fevers, infections, or recent hospitalizations. Patient reports appetite at 75% and energy level at 50%.      REVIEW OF SYSTEMS:  Review of Systems  Constitutional: Positive for fatigue.  All other systems reviewed and are negative.    PAST MEDICAL/SURGICAL HISTORY:  Past Medical History:  Diagnosis Date  . 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 Depression   . Diabetes mellitus   . Diverticulosis   . Dyslipidemia   . GERD (gastroesophageal reflux disease)   . Hypertension   . Insomnia   . Microproteinuria   . Renal insufficiency 2010  . Right carpal tunnel syndrome 12/24/2017  . Sleep apnea   . Sleep apnea 12/25/2017   Abnormal sleep study February 2019 CPAP ordered  . Thyroid disease    hypothyroid   Past Surgical History:  Procedure Laterality Date  . ABDOMINAL HYSTERECTOMY  1984  . BACK SURGERY    . BILATERAL OOPHORECTOMY    . COLONOSCOPY  01/29/12   Fields-2-3 hyperplastic polyps, mod internal hemorrhoids, diverticulosis throughout colon  . COLONOSCOPY N/A 07/03/2014   Dr. Oneida Alar: Hyperplastic polyps  . ESOPHAGOGASTRODUODENOSCOPY    01/29/2012   Fields-2-3cm sliding HH, fundic gland polyps, NEGATIVE H pylori  . ESOPHAGOGASTRODUODENOSCOPY N/A 07/03/2014   Dr. Oneida Alar: gastritis and normal small bowel biopsies  . GIVENS CAPSULE STUDY N/A 10/26/2014   SMALL BOWEL AVMs  . KNEE ARTHROSCOPY Right   . TOTAL ABDOMINAL HYSTERECTOMY W/ BILATERAL SALPINGOOPHORECTOMY       SOCIAL HISTORY:  Social History   Socioeconomic History  . Marital status: Divorced    Spouse name: Not on file  . Number of children: 2  . Years of education: Not on file  . Highest education level: Not on file  Occupational History  . Occupation: retired    Comment: 2014 or so  Social Needs  . Financial resource strain: Not on file  . Food insecurity:    Worry: Not on file    Inability: Not on file  . Transportation needs:    Medical: Not on file    Non-medical: Not on file  Tobacco Use  . Smoking status: Current Some Day Smoker    Packs/day: 0.25    Years: 30.00    Pack years: 7.50    Types: Cigarettes    Last attempt to quit: 10/16/2010    Years since quitting: 8.2  . Smokeless tobacco: Former Systems developer    Quit date: 10/28/2010  . Tobacco comment: quit in 2012 then restarted again May 2019.   Substance and Sexual Activity  . Alcohol use: Yes    Comment: socially, maybe 3 times a year   . Drug use: No  .  Sexual activity: Not on file  Lifestyle  . Physical activity:    Days per week: Not on file    Minutes per session: Not on file  . Stress: Not on file  Relationships  . Social connections:    Talks on phone: Not on file    Gets together: Not on file    Attends religious service: Not on file    Active member of club or organization: Not on file    Attends meetings of clubs or organizations: Not on file    Relationship status: Not on file  . Intimate partner violence:    Fear of current or ex partner: Not on file    Emotionally abused: Not on file    Physically abused: Not on file    Forced sexual activity: Not on file  Other Topics  Concern  . Not on file  Social History Narrative  . Not on file    FAMILY HISTORY:  Family History  Problem Relation Age of Onset  . Cardiomyopathy Father   . Heart failure Mother   . Hyperlipidemia Mother   . Stroke Brother   . Kidney cancer Brother   . Diabetes Paternal Uncle   . Heart disease Paternal Grandmother   . Colon cancer Neg Hx   . Colon polyps Neg Hx     CURRENT MEDICATIONS:  Outpatient Encounter Medications as of 01/01/2019  Medication Sig Note  . albuterol (PROVENTIL HFA;VENTOLIN HFA) 108 (90 Base) MCG/ACT inhaler Inhale 2 puffs into the lungs every 4 (four) hours as needed for wheezing or shortness of breath.   . ALPRAZolam (XANAX) 0.5 MG tablet 1/2 to 1 po BID prn anxiety   . amLODipine (NORVASC) 5 MG tablet Take 1 tablet (5 mg total) by mouth daily.   . colchicine 0.6 MG tablet 1 bid prn gout   . ferrous sulfate 325 (65 FE) MG tablet Take 325 mg by mouth daily with breakfast.   . fexofenadine (ALLEGRA) 180 MG tablet Take 180 mg by mouth daily.   . fluticasone (FLONASE) 50 MCG/ACT nasal spray PLACE 2 SPRAYS INTO THE NOSE AS NEEDED. 05/05/2016: otc  . HYDROcodone-acetaminophen (NORCO) 10-325 MG tablet Take 1 tablet by mouth every 6 (six) hours as needed.   . indapamide (LOZOL) 2.5 MG tablet TAKE 1 TABLET(2.5 MG) BY MOUTH EVERY MORNING   . Insulin Pen Needle 32G X 4 MM MISC Use with Victoza as directed   . levothyroxine (SYNTHROID, LEVOTHROID) 88 MCG tablet Take 1/2 tablet by mouth on Monday and take 1 tablet on all other days (change in directions)   . liraglutide (VICTOZA) 18 MG/3ML SOPN INJECT 1.2 MG INTO THE SKIN DAILY   . losartan (COZAAR) 100 MG tablet TAKE 1 TABLET BY MOUTH EVERY DAY   . Magnesium 250 MG TABS Take 250 mg by mouth daily.    . Multiple Vitamin (MULTIVITAMIN) tablet Take 1 tablet by mouth daily.   Marland Kitchen omeprazole (PRILOSEC) 40 MG capsule Take 1 capsule by mouth every day   . polyethylene glycol-electrolytes (TRILYTE) 420 g solution Take 4,000 mLs  by mouth as directed.   . pravastatin (PRAVACHOL) 40 MG tablet Take 1 tablet (40 mg total) by mouth daily.   . Simethicone (GAS-X PO) Take by mouth as needed.   Marland Kitchen amoxicillin (AMOXIL) 500 MG capsule Take 1 capsule (500 mg total) by mouth 3 (three) times daily. (Patient not taking: Reported on 01/01/2019)    No facility-administered encounter medications on file as of 01/01/2019.  ALLERGIES:  Allergies  Allergen Reactions  . Ivp Dye [Iodinated Diagnostic Agents]   . Zoloft [Sertraline Hcl]     Withdrawn,bad dreams     PHYSICAL EXAM:  ECOG Performance status: 1  Vitals:   01/01/19 0841  BP: 121/81  Pulse: 89  Resp: 18  Temp: 98.1 F (36.7 C)  SpO2: 100%   Filed Weights   01/01/19 0841  Weight: 156 lb 12.8 oz (71.1 kg)    Physical Exam Constitutional:      Appearance: Normal appearance.  Cardiovascular:     Rate and Rhythm: Normal rate and regular rhythm.     Heart sounds: Normal heart sounds.  Pulmonary:     Effort: Pulmonary effort is normal.     Breath sounds: Normal breath sounds.  Abdominal:     General: There is no distension.     Palpations: Abdomen is soft.  Musculoskeletal:        General: No swelling.  Skin:    General: Skin is warm.  Neurological:     General: No focal deficit present.     Mental Status: She is alert and oriented to person, place, and time.  Psychiatric:        Mood and Affect: Mood normal.        Behavior: Behavior normal.      LABORATORY DATA:  I have reviewed the labs as listed.  CBC    Component Value Date/Time   WBC 7.4 12/10/2018 1440   RBC 4.10 12/10/2018 1440   RBC 4.10 12/10/2018 1440   HGB 10.0 (L) 12/10/2018 1440   HGB 10.1 (L) 11/13/2018 1205   HCT 34.0 (L) 12/10/2018 1440   HCT 32.5 (L) 11/13/2018 1205   PLT 319 12/10/2018 1440   PLT 366 11/13/2018 1205   MCV 82.9 12/10/2018 1440   MCV 80 11/13/2018 1205   MCH 24.4 (L) 12/10/2018 1440   MCHC 29.4 (L) 12/10/2018 1440   RDW 17.2 (H) 12/10/2018 1440    RDW 16.5 (H) 11/13/2018 1205   LYMPHSABS 1.8 12/10/2018 1440   LYMPHSABS 2.4 11/13/2018 1205   MONOABS 0.4 12/10/2018 1440   EOSABS 0.0 12/10/2018 1440   EOSABS 0.1 11/13/2018 1205   BASOSABS 0.0 12/10/2018 1440   BASOSABS 0.1 11/13/2018 1205   CMP Latest Ref Rng & Units 12/25/2018 03/15/2018 01/03/2018  Glucose 65 - 99 mg/dL 91 - 104(H)  BUN 8 - 27 mg/dL 20 - 19  Creatinine 0.57 - 1.00 mg/dL 1.86(H) - 1.61(H)  Sodium 134 - 144 mmol/L 150(H) - 149(H)  Potassium 3.5 - 5.2 mmol/L 3.1(L) - 3.7  Chloride 96 - 106 mmol/L 107(H) - 103  CO2 20 - 29 mmol/L 24 - 25  Calcium 8.7 - 10.3 mg/dL 10.1 - 10.4(H)  Total Protein 6.0 - 8.5 g/dL 6.5 6.5 6.6  Total Bilirubin 0.0 - 1.2 mg/dL <0.2 <0.2 0.2  Alkaline Phos 39 - 117 IU/L 96 87 87  AST 0 - 40 IU/L 16 15 23   ALT 0 - 32 IU/L 13 15 15        DIAGNOSTIC IMAGING:  I have independently reviewed the scans and discussed with the patient.   I have reviewed Venita Lick LPN's note and agree with the documentation.  I personally performed a face-to-face visit, made revisions and my assessment and plan is as follows.    ASSESSMENT & PLAN:   IDA (iron deficiency anemia) 1.  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 -  Colonoscopy on 07/03/2014 shows normal mucosa in the terminal ileum, severe diverticulosis throughout the entire examined colon, small internal hemorrhoids.  Pathology showed hyperplastic rectal polyp.  Benign small bowel and gastric mucosa. -EGD on 07/03/2014 shows no obvious source of bleeding.  Moderate nonerosive gastritis.  She also underwent a capsule study in 2016. -Last Feraheme infusion was on 08/27/2014 and 05/03/2014. -CBC on 11/13/2018 shows hemoglobin of 10.1.  MCV was 80.  Ferritin was 20.  Percent saturation was 13.  Patient reports ice pica. -Denied any bleeding per rectum or melena.  She could not tolerate iron orally because of constipation.  - We reviewed  her blood work.  Stool for occult blood was negative.  Ferritin was low at 11.  N22, folic acid and copper were normal.  SPEP was negative.  LDH was normal. - She did receive Feraheme on 12/11/2018 and 12/18/2018. - She felt better after Feraheme infusion. -We will see her back in 2 weeks and recheck her labs.  2.  Smoking history: -She is a current active smoker, smokes approximately half pack per day for 50 years. -A low-dose CT scan was ordered but was declined due to insurance purposes.   3.  Health maintenance: -Mammogram on 03/05/2018 was BI-RADS Category 1.  4.  Weight loss: -He has unexplained weight loss of 47 pounds in less than a year.  She reports no appetite. -We will order a CT scan of the CAP with oral contrast.       Orders placed this encounter:  Orders Placed This Encounter  Procedures  . CT Abdomen Pelvis Wo Contrast  . CT Chest Wo Contrast  . CBC with Differential/Platelet  . Iron and TIBC  . Ferritin  . Comprehensive metabolic panel      [name], MD [place] [phone]

## 2019-01-02 ENCOUNTER — Ambulatory Visit (HOSPITAL_COMMUNITY)
Admission: RE | Admit: 2019-01-02 | Discharge: 2019-01-02 | Disposition: A | Discharge status: Home / Self Care | Payer: PPO | Source: Ambulatory Visit | Attending: Family Medicine | Admitting: Family Medicine

## 2019-01-02 DIAGNOSIS — M85851 Other specified disorders of bone density and structure, right thigh: Secondary | ICD-10-CM | POA: Diagnosis not present

## 2019-01-02 DIAGNOSIS — Z780 Asymptomatic menopausal state: Secondary | ICD-10-CM | POA: Insufficient documentation

## 2019-01-02 DIAGNOSIS — G4733 Obstructive sleep apnea (adult) (pediatric): Secondary | ICD-10-CM | POA: Diagnosis not present

## 2019-01-03 ENCOUNTER — Other Ambulatory Visit: Payer: Self-pay

## 2019-01-03 ENCOUNTER — Ambulatory Visit (HOSPITAL_COMMUNITY)
Admission: RE | Admit: 2019-01-03 | Discharge: 2019-01-03 | Disposition: A | Discharge status: Home / Self Care | Payer: PPO | Source: Ambulatory Visit | Attending: Family Medicine | Admitting: Family Medicine

## 2019-01-03 DIAGNOSIS — M7542 Impingement syndrome of left shoulder: Secondary | ICD-10-CM | POA: Diagnosis not present

## 2019-01-03 DIAGNOSIS — M542 Cervicalgia: Secondary | ICD-10-CM | POA: Insufficient documentation

## 2019-01-03 DIAGNOSIS — M50123 Cervical disc disorder at C6-C7 level with radiculopathy: Secondary | ICD-10-CM | POA: Diagnosis not present

## 2019-01-05 ENCOUNTER — Other Ambulatory Visit: Payer: Self-pay | Admitting: Family Medicine

## 2019-01-06 ENCOUNTER — Other Ambulatory Visit: Payer: Self-pay | Admitting: *Deleted

## 2019-01-06 ENCOUNTER — Telehealth: Payer: Self-pay | Admitting: *Deleted

## 2019-01-06 ENCOUNTER — Encounter: Payer: Self-pay | Admitting: Family Medicine

## 2019-01-06 DIAGNOSIS — R9389 Abnormal findings on diagnostic imaging of other specified body structures: Secondary | ICD-10-CM

## 2019-01-06 DIAGNOSIS — E039 Hypothyroidism, unspecified: Secondary | ICD-10-CM

## 2019-01-06 DIAGNOSIS — I10 Essential (primary) hypertension: Secondary | ICD-10-CM

## 2019-01-06 MED ORDER — LEVOTHYROXINE SODIUM 88 MCG PO TABS: ORAL_TABLET | ORAL | 5 refills | Status: DC

## 2019-01-06 MED ORDER — POTASSIUM CHLORIDE ER 10 MEQ PO TBCR: 10.0000 meq | EXTENDED_RELEASE_TABLET | Freq: Every day | ORAL | 5 refills | Status: DC

## 2019-01-06 MED ORDER — INDAPAMIDE 1.25 MG PO TABS: ORAL_TABLET | ORAL | 5 refills | Status: DC

## 2019-01-06 NOTE — Telephone Encounter (Signed)
Pt needs rx for cpap supplies sent to [facility].

## 2019-01-07 DIAGNOSIS — M4722 Other spondylosis with radiculopathy, cervical region: Secondary | ICD-10-CM | POA: Diagnosis not present

## 2019-01-08 ENCOUNTER — Other Ambulatory Visit: Payer: Self-pay

## 2019-01-08 ENCOUNTER — Ambulatory Visit (HOSPITAL_COMMUNITY)
Admission: RE | Admit: 2019-01-08 | Discharge: 2019-01-08 | Disposition: A | Discharge status: Home / Self Care | Payer: PPO | Source: Ambulatory Visit | Attending: Hematology | Admitting: Hematology

## 2019-01-08 DIAGNOSIS — R911 Solitary pulmonary nodule: Secondary | ICD-10-CM | POA: Diagnosis not present

## 2019-01-08 DIAGNOSIS — Z6828 Body mass index (BMI) 28.0-28.9, adult: Secondary | ICD-10-CM | POA: Insufficient documentation

## 2019-01-08 DIAGNOSIS — R634 Abnormal weight loss: Secondary | ICD-10-CM | POA: Diagnosis not present

## 2019-01-08 DIAGNOSIS — M899 Disorder of bone, unspecified: Secondary | ICD-10-CM | POA: Insufficient documentation

## 2019-01-08 DIAGNOSIS — I2510 Atherosclerotic heart disease of native coronary artery without angina pectoris: Secondary | ICD-10-CM | POA: Diagnosis not present

## 2019-01-08 DIAGNOSIS — I700 Atherosclerosis of aorta: Secondary | ICD-10-CM | POA: Insufficient documentation

## 2019-01-08 NOTE — Telephone Encounter (Signed)
Faxed order to CPAP supplies to [facility] & notified patient

## 2019-01-13 DIAGNOSIS — G4733 Obstructive sleep apnea (adult) (pediatric): Secondary | ICD-10-CM | POA: Diagnosis not present

## 2019-01-16 DIAGNOSIS — Z79899 Other long term (current) drug therapy: Secondary | ICD-10-CM | POA: Diagnosis not present

## 2019-01-16 DIAGNOSIS — G4733 Obstructive sleep apnea (adult) (pediatric): Secondary | ICD-10-CM | POA: Diagnosis not present

## 2019-01-16 DIAGNOSIS — Z95811 Presence of heart assist device: Secondary | ICD-10-CM | POA: Diagnosis not present

## 2019-01-16 DIAGNOSIS — I10 Essential (primary) hypertension: Secondary | ICD-10-CM | POA: Diagnosis not present

## 2019-01-16 DIAGNOSIS — M47813 Spondylosis without myelopathy or radiculopathy, cervicothoracic region: Secondary | ICD-10-CM | POA: Diagnosis not present

## 2019-01-16 DIAGNOSIS — C61 Malignant neoplasm of prostate: Secondary | ICD-10-CM | POA: Diagnosis not present

## 2019-01-16 DIAGNOSIS — C44321 Squamous cell carcinoma of skin of nose: Secondary | ICD-10-CM | POA: Diagnosis not present

## 2019-01-16 DIAGNOSIS — J069 Acute upper respiratory infection, unspecified: Secondary | ICD-10-CM | POA: Diagnosis not present

## 2019-01-16 DIAGNOSIS — M4722 Other spondylosis with radiculopathy, cervical region: Secondary | ICD-10-CM | POA: Diagnosis not present

## 2019-01-16 DIAGNOSIS — C4499 Other specified malignant neoplasm of skin, unspecified: Secondary | ICD-10-CM | POA: Diagnosis not present

## 2019-01-16 DIAGNOSIS — Z299 Encounter for prophylactic measures, unspecified: Secondary | ICD-10-CM | POA: Diagnosis not present

## 2019-01-16 DIAGNOSIS — R399 Unspecified symptoms and signs involving the genitourinary system: Secondary | ICD-10-CM | POA: Diagnosis not present

## 2019-01-16 DIAGNOSIS — M4802 Spinal stenosis, cervical region: Secondary | ICD-10-CM | POA: Diagnosis not present

## 2019-01-16 DIAGNOSIS — C44329 Squamous cell carcinoma of skin of other parts of face: Secondary | ICD-10-CM | POA: Diagnosis not present

## 2019-01-16 DIAGNOSIS — Z6836 Body mass index (BMI) 36.0-36.9, adult: Secondary | ICD-10-CM | POA: Diagnosis not present

## 2019-01-16 HISTORY — PX: CERVICAL DISC SURGERY: SHX588

## 2019-01-21 ENCOUNTER — Telehealth: Payer: Self-pay | Admitting: *Deleted

## 2019-01-22 ENCOUNTER — Ambulatory Visit (HOSPITAL_COMMUNITY): Payer: PPO | Admitting: Nurse Practitioner

## 2019-01-22 ENCOUNTER — Other Ambulatory Visit (HOSPITAL_COMMUNITY): Payer: PPO

## 2019-01-22 MED ORDER — OMEPRAZOLE 40 MG PO CPDR: 40.0000 mg | DELAYED_RELEASE_CAPSULE | Freq: Every day | ORAL | 0 refills | Status: DC

## 2019-01-22 NOTE — Telephone Encounter (Signed)
May have 90, virtual visit for April

## 2019-02-06 ENCOUNTER — Other Ambulatory Visit: Payer: Self-pay | Admitting: *Deleted

## 2019-02-06 MED ORDER — OMEPRAZOLE 40 MG PO CPDR: 40.0000 mg | DELAYED_RELEASE_CAPSULE | Freq: Every day | ORAL | 0 refills | Status: DC

## 2019-02-06 NOTE — Telephone Encounter (Signed)
Refill sent and message sent to front to schedule visit

## 2019-02-10 ENCOUNTER — Telehealth: Payer: Self-pay

## 2019-02-10 DIAGNOSIS — M4722 Other spondylosis with radiculopathy, cervical region: Secondary | ICD-10-CM | POA: Diagnosis not present

## 2019-02-10 NOTE — Telephone Encounter (Signed)
Called pt, TCS/-/+EGD w/SLF that was for 02/28/19 rescheduled to 05/01/19 at 9:30am d/t COVID-19 restrictions. LMOVM for endo scheduler. New instructions mailed.

## 2019-02-11 NOTE — Telephone Encounter (Signed)
Pt has been scheduled.  °

## 2019-02-12 ENCOUNTER — Other Ambulatory Visit: Payer: Self-pay

## 2019-02-13 ENCOUNTER — Inpatient Hospital Stay (HOSPITAL_BASED_OUTPATIENT_CLINIC_OR_DEPARTMENT_OTHER): Payer: PPO | Admitting: Nurse Practitioner

## 2019-02-13 ENCOUNTER — Other Ambulatory Visit: Payer: Self-pay

## 2019-02-13 ENCOUNTER — Encounter (HOSPITAL_COMMUNITY): Payer: Self-pay | Admitting: Nurse Practitioner

## 2019-02-13 ENCOUNTER — Inpatient Hospital Stay (HOSPITAL_COMMUNITY): Payer: PPO | Attending: Hematology

## 2019-02-13 VITALS — BP 114/81 | HR 91 | Temp 99.1°F | Resp 18 | Wt 156.0 lb

## 2019-02-13 DIAGNOSIS — R911 Solitary pulmonary nodule: Secondary | ICD-10-CM | POA: Diagnosis not present

## 2019-02-13 DIAGNOSIS — Z720 Tobacco use: Secondary | ICD-10-CM

## 2019-02-13 DIAGNOSIS — N189 Chronic kidney disease, unspecified: Secondary | ICD-10-CM

## 2019-02-13 DIAGNOSIS — D500 Iron deficiency anemia secondary to blood loss (chronic): Secondary | ICD-10-CM | POA: Diagnosis not present

## 2019-02-13 DIAGNOSIS — Q2730 Arteriovenous malformation, site unspecified: Secondary | ICD-10-CM | POA: Diagnosis not present

## 2019-02-13 DIAGNOSIS — Z1239 Encounter for other screening for malignant neoplasm of breast: Secondary | ICD-10-CM

## 2019-02-13 DIAGNOSIS — R070 Pain in throat: Secondary | ICD-10-CM

## 2019-02-13 DIAGNOSIS — E611 Iron deficiency: Secondary | ICD-10-CM

## 2019-02-13 DIAGNOSIS — D509 Iron deficiency anemia, unspecified: Secondary | ICD-10-CM

## 2019-02-13 LAB — CBC WITH DIFFERENTIAL/PLATELET
Abs Immature Granulocytes: 0.02 10*3/uL (ref 0.00–0.07)
Basophils Absolute: 0 10*3/uL (ref 0.0–0.1)
Basophils Relative: 1 %
Eosinophils Absolute: 0.2 10*3/uL (ref 0.0–0.5)
Eosinophils Relative: 3 %
HCT: 34.2 % — ABNORMAL LOW (ref 36.0–46.0)
Hemoglobin: 10.8 g/dL — ABNORMAL LOW (ref 12.0–15.0)
Immature Granulocytes: 0 %
Lymphocytes Relative: 46 %
Lymphs Abs: 2.8 10*3/uL (ref 0.7–4.0)
MCH: 27.6 pg (ref 26.0–34.0)
MCHC: 31.6 g/dL (ref 30.0–36.0)
MCV: 87.2 fL (ref 80.0–100.0)
Monocytes Absolute: 0.5 10*3/uL (ref 0.1–1.0)
Monocytes Relative: 8 %
Neutro Abs: 2.5 10*3/uL (ref 1.7–7.7)
Neutrophils Relative %: 42 %
Platelets: 278 10*3/uL (ref 150–400)
RBC: 3.92 MIL/uL (ref 3.87–5.11)
RDW: 15.9 % — ABNORMAL HIGH (ref 11.5–15.5)
WBC: 5.9 10*3/uL (ref 4.0–10.5)
nRBC: 0 % (ref 0.0–0.2)

## 2019-02-13 LAB — COMPREHENSIVE METABOLIC PANEL
ALT: 11 U/L (ref 0–44)
AST: 16 U/L (ref 15–41)
Albumin: 3.9 g/dL (ref 3.5–5.0)
Alkaline Phosphatase: 98 U/L (ref 38–126)
Anion gap: 12 (ref 5–15)
BUN: 19 mg/dL (ref 8–23)
CO2: 26 mmol/L (ref 22–32)
Calcium: 9.9 mg/dL (ref 8.9–10.3)
Chloride: 104 mmol/L (ref 98–111)
Creatinine, Ser: 1.57 mg/dL — ABNORMAL HIGH (ref 0.44–1.00)
GFR calc Af Amer: 39 mL/min — ABNORMAL LOW (ref >60)
GFR calc non Af Amer: 34 mL/min — ABNORMAL LOW (ref >60)
Glucose, Bld: 104 mg/dL — ABNORMAL HIGH (ref 70–99)
Potassium: 2.8 mmol/L — ABNORMAL LOW (ref 3.5–5.1)
Sodium: 142 mmol/L (ref 135–145)
Total Bilirubin: 0.3 mg/dL (ref 0.3–1.2)
Total Protein: 7.3 g/dL (ref 6.5–8.1)

## 2019-02-13 LAB — IRON AND TIBC
Iron: 68 ug/dL (ref 28–170)
Saturation Ratios: 24 % (ref 10.4–31.8)
TIBC: 289 ug/dL (ref 250–450)
UIBC: 221 ug/dL

## 2019-02-13 LAB — FERRITIN: Ferritin: 228 ng/mL (ref 11–307)

## 2019-02-13 NOTE — Assessment & Plan Note (Addendum)
1.  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 -Her last Feraheme infusion was on 12/11/2018 and 12/18/2018. -Patient reports ice pica.  She denies any bleeding per rectum or melena. -She cannot tolerate oral iron because of constipation. -Labs on 02/13/2019 showed her hemoglobin at 10.8, platelets at 278, ferritin was 228, percent saturation was 24. - We will give her 1 more infusion of Feraheme to see if that helps her hemoglobin. -We talked about Procrit injections today. - She will follow-up in 2 months with repeat labs.  2.  Smoking history: -She is a current active smoker, smokes half a pack per day for 50 years. -A low-dose CT scan was ordered but was declined due to insurance purposes.  3.  Health maintenance: -Mammogram was on 03/05/2018 was B RADS category 1 -We will go ahead and schedule her mammogram for after Mar 06, 2019. - Her last colonoscopy was in 2015.  She reports she has a follow-up colonoscopy in July 2020.  4.  Unexplained weight loss: - Patient has lost 47 pounds in less than a year. -She reports she is not have an appetite. -We ordered a CT scan of the CAP with oral contrast on 01/08/2019 which showed a solitary right lower lobe solid 4 mm pulmonary nodule, mildly increased in size from 2 mm in 2010.  Likely benign nodule due to the slow growth. - We will follow-up with a chest CT in 6 to 12 months from last scan.

## 2019-02-13 NOTE — Progress Notes (Signed)
[facility] [place], [place] [id number]   CLINIC:  Medical Oncology/Hematology  PCP:  [name], MD [address] [place] [address] [phone]   REASON FOR VISIT: Follow-up for normocytic anemia  CURRENT THERAPY: Intermittent iron infusions   INTERVAL HISTORY:  Ms. Harries 68 y.o. female returns for routine follow-up for normocytic anemia.  She is here today and feeling better since her iron infusions.  She reports she is still not feeling 100% with her energy levels.  She recently had neck surgery.  She is still having a sore throat which is to be expected.  She has no other complaints at this time and denies any bleeding per rectum or melena.  She reports she has a follow-up colonoscopy in July. Denies any nausea, vomiting, or diarrhea. Denies any new pains. Had not noticed any recent bleeding such as epistaxis, hematuria or hematochezia. Denies recent chest pain on exertion, shortness of breath on minimal exertion, pre-syncopal episodes, or palpitations. Denies any numbness or tingling in hands or feet. Denies any recent fevers, infections, or recent hospitalizations. Patient reports appetite at 75% and energy level at 50%.  She is eating well and maintaining her weight at this time.    REVIEW OF SYSTEMS:  Review of Systems  Constitutional: Positive for fatigue.  HENT:   Positive for sore throat.   Gastrointestinal: Positive for constipation.  Neurological: Positive for numbness.  Psychiatric/Behavioral: Positive for sleep disturbance.  All other systems reviewed and are negative.    PAST MEDICAL/SURGICAL HISTORY:  Past Medical History:  Diagnosis Date  . 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 Depression   . Diabetes mellitus   . Diverticulosis   . Dyslipidemia   . GERD (gastroesophageal reflux disease)   . Hypertension   . Insomnia   . Microproteinuria   . Renal insufficiency 2010   . Right carpal tunnel syndrome 12/24/2017  . Sleep apnea   . Sleep apnea 12/25/2017   Abnormal sleep study February 2019 CPAP ordered  . Thyroid disease    hypothyroid   Past Surgical History:  Procedure Laterality Date  . ABDOMINAL HYSTERECTOMY  1984  . BACK SURGERY    . BILATERAL OOPHORECTOMY    . CERVICAL DISC SURGERY  01/16/2019  . COLONOSCOPY  01/29/12   Fields-2-3 hyperplastic polyps, mod internal hemorrhoids, diverticulosis throughout colon  . COLONOSCOPY N/A 07/03/2014   Dr. Oneida Alar: Hyperplastic polyps  . ESOPHAGOGASTRODUODENOSCOPY   01/29/2012   Fields-2-3cm sliding HH, fundic gland polyps, NEGATIVE H pylori  . ESOPHAGOGASTRODUODENOSCOPY N/A 07/03/2014   Dr. Oneida Alar: gastritis and normal small bowel biopsies  . G[facility] CAPSULE STUDY N/A 10/26/2014   SMALL BOWEL AVMs  . KNEE ARTHROSCOPY Right   . TOTAL ABDOMINAL HYSTERECTOMY W/ BILATERAL SALPINGOOPHORECTOMY       SOCIAL HISTORY:  Social History   Socioeconomic History  . Marital status: Divorced    Spouse name: Not on file  . Number of children: 2  . Years of education: Not on file  . Highest education level: Not on file  Occupational History  . Occupation: retired    Comment: 2014 or so  Social Needs  . Financial resource strain: Not on file  . Food insecurity:    Worry: Not on file    Inability: Not on file  . Transportation needs:    Medical: Not on file    Non-medical: Not on file  Tobacco Use  . Smoking status:  Current Some Day Smoker    Packs/day: 0.25    Years: 30.00    Pack years: 7.50    Types: Cigarettes    Last attempt to quit: 10/16/2010    Years since quitting: 8.3  . Smokeless tobacco: Former Systems developer    Quit date: 10/28/2010  . Tobacco comment: quit in 2012 then restarted again May 2019.   Substance and Sexual Activity  . Alcohol use: Yes    Comment: socially, maybe 3 times a year   . Drug use: No  . Sexual activity: Not on file  Lifestyle  . Physical activity:    Days per week: Not on file     Minutes per session: Not on file  . Stress: Not on file  Relationships  . Social connections:    Talks on phone: Not on file    Gets together: Not on file    Attends religious service: Not on file    Active member of club or organization: Not on file    Attends meetings of clubs or organizations: Not on file    Relationship status: Not on file  . Intimate partner violence:    Fear of current or ex partner: Not on file    Emotionally abused: Not on file    Physically abused: Not on file    Forced sexual activity: Not on file  Other Topics Concern  . Not on file  Social History Narrative  . Not on file    FAMILY HISTORY:  Family History  Problem Relation Age of Onset  . Cardiomyopathy Father   . Heart failure Mother   . Hyperlipidemia Mother   . Stroke Brother   . Kidney cancer Brother   . Diabetes Paternal Uncle   . Heart disease Paternal Grandmother   . Colon cancer Neg Hx   . Colon polyps Neg Hx     CURRENT MEDICATIONS:  Outpatient Encounter Medications as of 02/13/2019  Medication Sig Note  . albuterol (PROVENTIL HFA;VENTOLIN HFA) 108 (90 Base) MCG/ACT inhaler Inhale 2 puffs into the lungs every 4 (four) hours as needed for wheezing or shortness of breath.   . ALPRAZolam (XANAX) 0.5 MG tablet 1/2 to 1 po BID prn anxiety   . amLODipine (NORVASC) 5 MG tablet Take 1 tablet (5 mg total) by mouth daily.   . colchicine 0.6 MG tablet 1 bid prn gout   . cyclobenzaprine (FLEXERIL) 10 MG tablet TK 1 T PO TID PRN   . ferrous sulfate 325 (65 FE) MG tablet Take 325 mg by mouth daily with breakfast.   . fexofenadine (ALLEGRA) 180 MG tablet Take 180 mg by mouth daily.   . fluticasone (FLONASE) 50 MCG/ACT nasal spray PLACE 2 SPRAYS INTO THE NOSE AS NEEDED. 05/05/2016: otc  . HYDROcodone-acetaminophen (NORCO) 10-325 MG tablet Take 1 tablet by mouth every 6 (six) hours as needed.   . indapamide (LOZOL) 1.25 MG tablet TAKE 1 TABLET BY MOUTH EVERY MORNING   . Insulin Pen Needle 32G X 4  MM MISC Use with Victoza as directed   . levothyroxine (SYNTHROID, LEVOTHROID) 88 MCG tablet Take 1/2 tablet by mouth on Mondays, wednesdays, and fridays and 1 tablet on all other days   . liraglutide (VICTOZA) 18 MG/3ML SOPN INJECT 1.2 MG INTO THE SKIN DAILY   . losartan (COZAAR) 100 MG tablet TAKE 1 TABLET BY MOUTH EVERY DAY   . Magnesium 250 MG TABS Take 250 mg by mouth daily.    . Multiple Vitamin (MULTIVITAMIN)  tablet Take 1 tablet by mouth daily.   Marland Kitchen omeprazole (PRILOSEC) 40 MG capsule Take 1 capsule (40 mg total) by mouth daily.   . polyethylene glycol-electrolytes (TRILYTE) 420 g solution Take 4,000 mLs by mouth as directed.   . potassium chloride (K-DUR) 10 MEQ tablet Take 1 tablet (10 mEq total) by mouth daily.   . pravastatin (PRAVACHOL) 40 MG tablet Take 1 tablet (40 mg total) by mouth daily.   . Simethicone (GAS-X PO) Take by mouth as needed.   Marland Kitchen amoxicillin (AMOXIL) 500 MG capsule Take 1 capsule (500 mg total) by mouth 3 (three) times daily. (Patient not taking: Reported on 01/01/2019)   . HYDROcodone-acetaminophen (NORCO/VICODIN) 5-325 MG tablet TK 1 T PO Q 6 H PRF PAIN    No facility-administered encounter medications on file as of 02/13/2019.     ALLERGIES:  Allergies  Allergen Reactions  . Ivp Dye [Iodinated Diagnostic Agents]   . Zoloft [Sertraline Hcl]     Withdrawn,bad dreams     PHYSICAL EXAM:  ECOG Performance status: 1  Vitals:   02/13/19 0935  BP: 114/81  Pulse: 91  Resp: 18  Temp: 99.1 F (37.3 C)  SpO2: 100%   Filed Weights   02/13/19 0935  Weight: 156 lb (70.8 kg)    Physical Exam Constitutional:      Appearance: She is obese.  Neck:     Musculoskeletal: Normal range of motion.  Cardiovascular:     Rate and Rhythm: Normal rate and regular rhythm.     Heart sounds: Normal heart sounds.  Pulmonary:     Effort: Pulmonary effort is normal.     Breath sounds: Normal breath sounds.  Abdominal:     General: Bowel sounds are normal.      Palpations: Abdomen is soft.  Musculoskeletal: Normal range of motion.  Skin:    General: Skin is warm and dry.  Neurological:     Mental Status: She is alert and oriented to person, place, and time. Mental status is at baseline.  Psychiatric:        Mood and Affect: Mood normal.        Behavior: Behavior normal.        Thought Content: Thought content normal.        Judgment: Judgment normal.      LABORATORY DATA:  I have reviewed the labs as listed.  CBC    Component Value Date/Time   WBC 5.9 02/13/2019 0854   RBC 3.92 02/13/2019 0854   HGB 10.8 (L) 02/13/2019 0854   HGB 10.1 (L) 11/13/2018 1205   HCT 34.2 (L) 02/13/2019 0854   HCT 32.5 (L) 11/13/2018 1205   PLT 278 02/13/2019 0854   PLT 366 11/13/2018 1205   MCV 87.2 02/13/2019 0854   MCV 80 11/13/2018 1205   MCH 27.6 02/13/2019 0854   MCHC 31.6 02/13/2019 0854   RDW 15.9 (H) 02/13/2019 0854   RDW 16.5 (H) 11/13/2018 1205   LYMPHSABS 2.8 02/13/2019 0854   LYMPHSABS 2.4 11/13/2018 1205   MONOABS 0.5 02/13/2019 0854   EOSABS 0.2 02/13/2019 0854   EOSABS 0.1 11/13/2018 1205   BASOSABS 0.0 02/13/2019 0854   BASOSABS 0.1 11/13/2018 1205   CMP Latest Ref Rng & Units 02/13/2019 12/25/2018 03/15/2018  Glucose 70 - 99 mg/dL 104(H) 91 -  BUN 8 - 23 mg/dL 19 20 -  Creatinine 0.44 - 1.00 mg/dL 1.57(H) 1.86(H) -  Sodium 135 - 145 mmol/L 142 150(H) -  Potassium 3.5 -  5.1 mmol/L 2.8(L) 3.1(L) -  Chloride 98 - 111 mmol/L 104 107(H) -  CO2 22 - 32 mmol/L 26 24 -  Calcium 8.9 - 10.3 mg/dL 9.9 10.1 -  Total Protein 6.5 - 8.1 g/dL 7.3 6.5 6.5  Total Bilirubin 0.3 - 1.2 mg/dL 0.3 <0.2 <0.2  Alkaline Phos 38 - 126 U/L 98 96 87  AST 15 - 41 U/L 16 16 15   ALT 0 - 44 U/L 11 13 15        DIAGNOSTIC IMAGING:  I have independently reviewed the CT scans and discussed with the patient.   I personally performed a face-to-face visit.  All questions were answered to patient's stated satisfaction. Encouraged patient to call with any  new concerns or questions before his next visit to the cancer center and we can certain see him sooner, if needed.     ASSESSMENT & PLAN:   Microcytic anemia 1.  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 -Her last Feraheme infusion was on 12/11/2018 and 12/18/2018. -Patient reports ice pica.  She denies any bleeding per rectum or melena. -She cannot tolerate oral iron because of constipation. -Labs on 02/13/2019 showed her hemoglobin at 10.8, platelets at 278, ferritin was 228, percent saturation was 24. - We will give her 1 more infusion of Feraheme to see if that helps her hemoglobin. -We talked about Procrit injections today. - She will follow-up in 2 months with repeat labs.  2.  Smoking history: -She is a current active smoker, smokes half a pack per day for 50 years. -A low-dose CT scan was ordered but was declined due to insurance purposes.  3.  Health maintenance: -Mammogram was on 03/05/2018 was B RADS category 1 -We will go ahead and schedule her mammogram for after Mar 06, 2019. - Her last colonoscopy was in 2015.  She reports she has a follow-up colonoscopy in July 2020.  4.  Unexplained weight loss: - Patient has lost 47 pounds in less than a year. -She reports she is not have an appetite. -We ordered a CT scan of the CAP with oral contrast on 01/08/2019 which showed a solitary right lower lobe solid 4 mm pulmonary nodule, mildly increased in size from 2 mm in 2010.  Likely benign nodule due to the slow growth. - We will follow-up with a chest CT in 6 to 12 months from last scan.      Orders placed this encounter:  Orders Placed This Encounter  Procedures  . MM Digital Screening  . Lactate dehydrogenase  . CBC with Differential/Platelet  . Comprehensive metabolic panel  . Ferritin  . Iron and TIBC  . Vitamin B12  . VITAMIN D 25 Hydroxy (Vit-D Deficiency, Fractures)  . Folate     Francene Finders,  FNP-C [place] [phone]

## 2019-02-13 NOTE — Patient Instructions (Signed)
[facility] at [facility] Discharge Instructions  Follow up in 2 months with labs   Thank you for choosing [facility] at [facility] to provide your oncology and hematology care.  To afford each patient quality time with our provider, please arrive at least 15 minutes before your scheduled appointment time.   If you have a lab appointment with the Cancer Center please come in thru the  Main Entrance and check in at the main information desk  You need to re-schedule your appointment should you arrive 10 or more minutes late.  We strive to give you quality time with our providers, and arriving late affects you and other patients whose appointments are after yours.  Also, if you no show three or more times for appointments you may be dismissed from the clinic at the providers discretion.     Again, thank you for choosing [facility].  Our hope is that these requests will decrease the amount of time that you wait before being seen by our physicians.       _____________________________________________________________  Should you have questions after your visit to [facility], please contact our office at [phone] between the hours of 8:00 a.m. and 4:30 p.m.  Voicemails left after 4:00 p.m. will not be returned until the following business day.  For prescription refill requests, have your pharmacy contact our office and allow 72 hours.    Cancer Center Support Programs:   > Cancer Support Group  2nd Tuesday of the month 1pm-2pm, Journey Room    

## 2019-02-13 NOTE — Addendum Note (Signed)
Addended by: Glennie [place] on: 02/13/2019 11:06 AM   Modules accepted: Orders

## 2019-02-17 ENCOUNTER — Ambulatory Visit (INDEPENDENT_AMBULATORY_CARE_PROVIDER_SITE_OTHER): Payer: PPO | Admitting: Family Medicine

## 2019-02-17 ENCOUNTER — Other Ambulatory Visit: Payer: Self-pay

## 2019-02-17 DIAGNOSIS — E7849 Other hyperlipidemia: Secondary | ICD-10-CM

## 2019-02-17 DIAGNOSIS — N3000 Acute cystitis without hematuria: Secondary | ICD-10-CM | POA: Diagnosis not present

## 2019-02-17 DIAGNOSIS — E039 Hypothyroidism, unspecified: Secondary | ICD-10-CM | POA: Diagnosis not present

## 2019-02-17 DIAGNOSIS — I10 Essential (primary) hypertension: Secondary | ICD-10-CM

## 2019-02-17 DIAGNOSIS — E119 Type 2 diabetes mellitus without complications: Secondary | ICD-10-CM | POA: Diagnosis not present

## 2019-02-17 MED ORDER — AMLODIPINE BESYLATE 5 MG PO TABS: 5.0000 mg | ORAL_TABLET | Freq: Every day | ORAL | 1 refills | Status: DC

## 2019-02-17 MED ORDER — LEVOTHYROXINE SODIUM 88 MCG PO TABS: ORAL_TABLET | ORAL | 5 refills | Status: DC

## 2019-02-17 MED ORDER — HYDROCODONE-ACETAMINOPHEN 5-325 MG PO TABS: ORAL_TABLET | ORAL | 0 refills | Status: DC

## 2019-02-17 MED ORDER — CEFPROZIL 500 MG PO TABS: ORAL_TABLET | ORAL | 0 refills | Status: DC

## 2019-02-17 MED ORDER — INDAPAMIDE 1.25 MG PO TABS: ORAL_TABLET | ORAL | 1 refills | Status: DC

## 2019-02-17 MED ORDER — LOSARTAN POTASSIUM 100 MG PO TABS: 100.0000 mg | ORAL_TABLET | Freq: Every day | ORAL | 1 refills | Status: DC

## 2019-02-17 MED ORDER — POTASSIUM CHLORIDE ER 10 MEQ PO TBCR: 10.0000 meq | EXTENDED_RELEASE_TABLET | Freq: Every day | ORAL | 1 refills | Status: DC

## 2019-02-17 MED ORDER — PRAVASTATIN SODIUM 40 MG PO TABS: 40.0000 mg | ORAL_TABLET | Freq: Every day | ORAL | 1 refills | Status: DC

## 2019-02-17 NOTE — Progress Notes (Signed)
Subjective:    Patient ID: Joanna [name], female    DOB: November 12, 1950, 68 y.o.   MRN: [id number]   Hypertension  This is a chronic problem. There are no compliance problems.   pt states her BP has been doing good  The patient was seen today as part of a comprehensive diabetic check up.the patient does have diabetes.  The patient follows here on a regular basis.  The patient relates medication compliance. No significant side effects to the medications. Denies any low glucose spells. Relates compliance with diet to a reasonable level. Patient does do labwork intermittently and understands the dangers of diabetes.   Patient has thyroid condition.  Takes thyroid medication on a regular basis.  States that the proper way.  Relates compliance.  States no negative side effects.  States condition seems to be under good control.  Patient here for follow-up regarding cholesterol.  The patient does have hyperlipidemia.  Patient does try to maintain a reasonable diet.  Patient does take the medication on a regular basis.  Denies missing a dose.  The patient denies any obvious side effects.  Prior blood work results reviewed with the patient.  The patient is aware of his cholesterol goals and the need to keep it under good control to lessen the risk of disease.  .pt states that surgeon placed her on muscle relaxer and pain pills; but she has completed the course of those meds. Pt would like a refill on pain pills.  Patient relates that her pain levels are doing better but she still needs a refill on hydrocodone she denies abusing it  Pt states that she is having some burning while urination. Started 2 days ago. No treatments tried  Virtual Visit via Video Note  I connected with Joanna [name] on 02/17/19 at 10:00 AM EDT by a video enabled telemedicine application and verified that I am speaking with the correct person using two identifiers.  Location: Patient: home Provider: office   I discussed the  limitations of evaluation and management by telemedicine and the availability of in person appointments. The patient expressed understanding and agreed to proceed.  History of Present Illness:    Observations/Objective:   Assessment and Plan:   Follow Up Instructions:    I discussed the assessment and treatment plan with the patient. The patient was provided an opportunity to ask questions and all were answered. The patient agreed with the plan and demonstrated an understanding of the instructions.   The patient was advised to call back or seek an in-person evaluation if the symptoms worsen or if the condition fails to improve as anticipated.  I provided 25 minutes of non-face-to-face time during this encounter.   [name], LPN   Review of Systems     Objective:   Physical Exam        Assessment & Plan:  HTN- Patient was seen today as part of a visit regarding hypertension. The importance of healthy diet and regular physical activity was discussed. The importance of compliance with medications discussed.  Ideal goal is to keep blood pressure low elevated levels certainly below 263/78 when possible.  The patient was counseled that keeping blood pressure under control lessen his risk of complications.  The importance of regular follow-ups was discussed with the patient.  Low-salt diet such as DASH recommended.  Regular physical activity was recommended as well.  Patient was advised to keep regular follow-ups.  The patient was seen today as part of  an evaluation regarding hyperlipidemia.  Recent lab work has been reviewed with the patient as well as the goals for good cholesterol care.  In addition to this medications have been discussed the importance of compliance with diet and medications discussed as well.  Finally the patient is aware that poor control of cholesterol, noncompliance can dramatically increase the risk of complications. The patient will keep regular  office visits and the patient does agreed to periodic lab work.  The patient was seen today as part of a comprehensive visit for diabetes. The importance of keeping her A1c at or below 7 was discussed.  Importance of regular physical activity was discussed.   The importance of adherence to medication as well as a controlled low starch/sugar diet was also discussed.  Standard follow-up visit recommended.  Also patient aware failure to keep diabetes under control increases the risk of complications.  Patient was seen today regarding hypothyroidism.  Importance of healthy diet, regular physical activity was discussed.  Importance of compliance with medication and regular checks regarding this was discussed.   UTI antibiotics prescribed for 3 days  Significant postoperative pain refill of pain medicine #30 was given if having use long-term will have to establish chronic pain contract

## 2019-02-20 ENCOUNTER — Ambulatory Visit (HOSPITAL_COMMUNITY): Payer: PPO

## 2019-02-21 ENCOUNTER — Encounter (HOSPITAL_COMMUNITY): Payer: Self-pay

## 2019-02-21 ENCOUNTER — Other Ambulatory Visit: Payer: Self-pay

## 2019-02-21 ENCOUNTER — Inpatient Hospital Stay (HOSPITAL_COMMUNITY): Payer: PPO | Attending: Hematology

## 2019-02-21 VITALS — BP 121/78 | HR 77 | Temp 98.4°F | Resp 18

## 2019-02-21 DIAGNOSIS — D500 Iron deficiency anemia secondary to blood loss (chronic): Secondary | ICD-10-CM | POA: Insufficient documentation

## 2019-02-21 DIAGNOSIS — E611 Iron deficiency: Secondary | ICD-10-CM

## 2019-02-21 MED ORDER — SODIUM CHLORIDE 0.9 % IV SOLN
Freq: Once | INTRAVENOUS | Status: AC
  Administered 2019-02-21: 13:00:00 via INTRAVENOUS

## 2019-02-21 MED ORDER — SODIUM CHLORIDE 0.9 % IV SOLN
510.0000 mg | Freq: Once | INTRAVENOUS | Status: AC
  Administered 2019-02-21: 510 mg via INTRAVENOUS
  Filled 2019-02-21: qty 17

## 2019-02-21 NOTE — Progress Notes (Signed)
Feraheme given today per MD orders. Tolerated infusion without adverse affects. Vital signs stable. No complaints at this time. Discharged from clinic ambulatory. F/U with [facility] as scheduled.  

## 2019-03-03 ENCOUNTER — Telehealth: Payer: Self-pay | Admitting: Family Medicine

## 2019-03-03 ENCOUNTER — Other Ambulatory Visit: Payer: Self-pay

## 2019-03-03 ENCOUNTER — Ambulatory Visit (INDEPENDENT_AMBULATORY_CARE_PROVIDER_SITE_OTHER): Payer: PPO | Admitting: Gastroenterology

## 2019-03-03 ENCOUNTER — Encounter: Payer: Self-pay | Admitting: Gastroenterology

## 2019-03-03 DIAGNOSIS — E611 Iron deficiency: Secondary | ICD-10-CM | POA: Diagnosis not present

## 2019-03-03 NOTE — Telephone Encounter (Signed)
Pt contacted and informed that provider would recommend Lorazepam 0.5 mg nightly as needed. Pt verbalized understanding. Pt also instructed to hold off on Xanax; pt verbalized understanding. Pt transferred up front to set up an appt within the next few weeks for the tremors in hand.

## 2019-03-03 NOTE — Progress Notes (Signed)
cc'ed to pcp °

## 2019-03-03 NOTE — Telephone Encounter (Signed)
I would recommend lorazepam 0.5 mg 1 nightly as needed Do not take with Xanax matter-of-fact I would recommend holding off on the Xanax for now Finally as for the tremor I think it would be a good idea for [place] to see her in the office at her convenience somewhere in the next few weeks this can be done at her convenience

## 2019-03-03 NOTE — Progress Notes (Addendum)
REVIEWED-NO ADDITIONAL RECOMMENDATIONS.  Primary Care Physician:  [name], MD  Primary GI: Dr. Oneida Alar   Virtual Visit via Telephone Note Due to COVID-19, visit is conducted virtually and was requested by patient.   I connected with Joanna [name] on 03/03/19 at 11:00 AM EDT by telephone and verified that I am speaking with the correct person using two identifiers.   I discussed the limitations, risks, security and privacy concerns of performing an evaluation and management service by telephone and the availability of in person appointments. I also discussed with the patient that there may be a patient responsible charge related to this service. The patient expressed understanding and agreed to proceed.  Chief Complaint  Patient presents with  . Joanna [name]    f/u. 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 She has had drifting Hgb and ferritin noted a few months prior, with fatigue. Had been taking been taking NSAIDs but no longer. She had also reported unintentional weight loss. She is already scheduled for a colonoscopy/EGD with Dr. Oneida Alar; this had to be postponed due to COVID but is now on the schedule for July. She has been seeing Hematology and recently had Feraheme infusion. Hgb 10.8. Ferritin 228 (was 14 nine months ago).   No overt GI bleeding. No abdominal pain. Appetite is a bit better. No NSAIDs. Feels a lump in her throat but recently underwent anterior cervical surgery. Hasn't weighed herself recently.   Difficulty sleeping for past 2-3 weeks. Will be calling Dr. Wolfgang Phoenix today about this.   Past Medical History:  Diagnosis Date  . 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 Depression   . Diabetes mellitus   . Diverticulosis   . Dyslipidemia   . GERD (gastroesophageal reflux disease)    . Hypertension   . Insomnia   . Microproteinuria   . Renal insufficiency 2010  . Right carpal tunnel syndrome 12/24/2017  . Sleep apnea   . Sleep apnea 12/25/2017   Abnormal sleep study February 2019 CPAP ordered  . Thyroid disease    hypothyroid     Past Surgical History:  Procedure Laterality Date  . ABDOMINAL HYSTERECTOMY  1984  . BACK SURGERY    . BILATERAL OOPHORECTOMY    . CERVICAL DISC SURGERY  01/16/2019  . COLONOSCOPY  01/29/12   Fields-2-3 hyperplastic polyps, mod internal hemorrhoids, diverticulosis throughout colon  . COLONOSCOPY N/A 07/03/2014   Dr. Oneida Alar: Hyperplastic polyps  . ESOPHAGOGASTRODUODENOSCOPY   01/29/2012   Fields-2-3cm sliding HH, fundic gland polyps, NEGATIVE H pylori  . ESOPHAGOGASTRODUODENOSCOPY N/A 07/03/2014   Dr. Oneida Alar: gastritis and normal small bowel biopsies  . [facility] CAPSULE STUDY N/A 10/26/2014   SMALL BOWEL AVMs  . KNEE ARTHROSCOPY Right   . TOTAL ABDOMINAL HYSTERECTOMY W/ BILATERAL SALPINGOOPHORECTOMY       Current Meds  Medication Sig  . albuterol (PROVENTIL HFA;VENTOLIN HFA) 108 (90 Base) MCG/ACT inhaler Inhale 2 puffs into the lungs every 4 (four) hours as needed for wheezing or shortness of breath.  . ALPRAZolam (XANAX) 0.5 MG tablet 1/2 to 1 po BID prn anxiety  . amLODipine (NORVASC) 5 MG tablet Take 1 tablet (5 mg total) by mouth daily.  . colchicine 0.6 MG tablet 1 bid prn gout  . cyclobenzaprine (FLEXERIL) 10 MG tablet TK 1 T PO TID PRN  .  ferrous sulfate 325 (65 FE) MG tablet Take 325 mg by mouth daily with breakfast.  . fexofenadine (ALLEGRA) 180 MG tablet Take 180 mg by mouth daily.  . fluticasone (FLONASE) 50 MCG/ACT nasal spray PLACE 2 SPRAYS INTO THE NOSE AS NEEDED.  Marland Kitchen HYDROcodone-acetaminophen (NORCO/VICODIN) 5-325 MG tablet Take one tablet by mouth every 4 hours prn pain  . indapamide (LOZOL) 1.25 MG tablet TAKE 1 TABLET BY MOUTH EVERY MORNING  . Insulin Pen Needle 32G X 4 MM MISC Use with Victoza as directed  .  levothyroxine (SYNTHROID) 88 MCG tablet Take 1/2 tablet by mouth on Mondays, wednesdays, and fridays and 1 tablet on all other days  . liraglutide (VICTOZA) 18 MG/3ML SOPN INJECT 1.2 MG INTO THE SKIN DAILY  . losartan (COZAAR) 100 MG tablet Take 1 tablet (100 mg total) by mouth daily.  . Magnesium 250 MG TABS Take 250 mg by mouth daily.   . Multiple Vitamin (MULTIVITAMIN) tablet Take 1 tablet by mouth daily.  Marland Kitchen omeprazole (PRILOSEC) 40 MG capsule Take 1 capsule (40 mg total) by mouth daily.  . potassium chloride (K-DUR) 10 MEQ tablet Take 1 tablet (10 mEq total) by mouth daily.  . pravastatin (PRAVACHOL) 40 MG tablet Take 1 tablet (40 mg total) by mouth daily.  . Simethicone (GAS-X PO) Take by mouth as needed.     Family History  Problem Relation Age of Onset  . Cardiomyopathy Father   . Heart failure Mother   . Hyperlipidemia Mother   . Stroke Brother   . Kidney cancer Brother   . Diabetes Paternal Uncle   . Heart disease Paternal Grandmother   . Colon cancer Neg Hx   . Colon polyps Neg Hx     Social History   Socioeconomic History  . Marital status: Divorced    Spouse name: Not on file  . Number of children: 2  . Years of education: Not on file  . Highest education level: Not on file  Occupational History  . Occupation: retired    Comment: 2014 or so  Social Needs  . Financial resource strain: Not on file  . Food insecurity:    Worry: Not on file    Inability: Not on file  . Transportation needs:    Medical: Not on file    Non-medical: Not on file  Tobacco Use  . Smoking status: Former Smoker    Packs/day: 0.25    Years: 30.00    Pack years: 7.50    Types: Cigarettes    Last attempt to quit: 10/16/2010    Years since quitting: 8.3  . Smokeless tobacco: Former Systems developer    Quit date: 10/28/2010  Substance and Sexual Activity  . Alcohol use: Yes    Comment: socially, maybe 3 times a year   . Drug use: No  . Sexual activity: Not on file  Lifestyle  . Physical  activity:    Days per week: Not on file    Minutes per session: Not on file  . Stress: Not on file  Relationships  . Social connections:    Talks on phone: Not on file    Gets together: Not on file    Attends religious service: Not on file    Active member of club or organization: Not on file    Attends meetings of clubs or organizations: Not on file    Relationship status: Not on file  Other Topics Concern  . Not on file  Social History Narrative  .  Not on file       Review of Systems: Gen: Denies fever, chills, anorexia. Denies fatigue, weakness, weight loss.  CV: Denies chest pain, palpitations, syncope, peripheral edema, and claudication. Resp: Denies dyspnea at rest, cough, wheezing, coughing up blood, and pleurisy. GI: see HPI Derm: Denies rash, itching, dry skin Psych: see HPI, difficulty sleeping Heme: Denies bruising, bleeding, and enlarged lymph nodes.  Observations/Objective: No distress. Video call with patient alert, oriented, cooperative.   Assessment and Plan: 68 year old female with history of Joanna [name] in the past felt secondary to small bowel AVMs, with last evaluation in 2015/2016. Due to drifting Hgb/ferritin, evidence for recurrent Joanna [name], she will be undergoing colonoscopy/EGD in the near future. Continues to follow with Hematology and recently received Feraheme. Overall, clinically feels better. Continues with oral iron.   Hold iron 7 days prior Proceed with colonoscopy/EGD with Dr. Oneida Alar in the near future. The risks, benefits, and alternatives have been discussed in detail with the patient. They state understanding and desire to proceed.  Return in 4 months Continue to avoid NSAIDs   Follow Up Instructions:    I discussed the assessment and treatment plan with the patient. The patient was provided an opportunity to ask questions and all were answered. The patient agreed with the plan and demonstrated an understanding of the instructions.   The patient was  advised to call back or seek an in-person evaluation if the symptoms worsen or if the condition fails to improve as anticipated.  I provided 10 minutes of face-to-face time during this video encounter.  Annitta Needs, PhD, ANP-BC [facility] Gastroenter[facility]

## 2019-03-03 NOTE — Telephone Encounter (Signed)
Pt states she's not sleeping well, would like something called in to help with that  Also, pt states the cervical fusion surgery did not help her hand  Wonders if she needs referral to a doctor for her hand  Please advise & call pt    [facility]

## 2019-03-03 NOTE — Telephone Encounter (Signed)
Patient states she is not sleeping due to a lot on her mind she states she has not really slept for three days now. Was told not to take Ambien,but she needs something to help with this. And she also mentions she is still having tremors in her left hand even after the surgery. She states she is unable to hold anything in it.

## 2019-03-03 NOTE — Patient Instructions (Signed)
Please keep plans for colonoscopy and upper endoscopy with Dr. Oneida Alar.  Please stop the ion 7 days before the procedure.  We will see you back in 4 months or sooner if needed!  I enjoyed seeing you again today! As you know, I value our relationship and want to provide genuine, compassionate, and quality care. I welcome your feedback. If you receive a survey regarding your visit,  I greatly appreciate you taking time to fill this out. See you next time!  Annitta Needs, PhD, ANP-BC [facility]

## 2019-03-04 ENCOUNTER — Other Ambulatory Visit: Payer: Self-pay | Admitting: Family Medicine

## 2019-03-04 ENCOUNTER — Encounter: Payer: Self-pay | Admitting: Gastroenterology

## 2019-03-04 MED ORDER — LORAZEPAM 0.5 MG PO TABS: ORAL_TABLET | ORAL | 0 refills | Status: DC

## 2019-03-12 ENCOUNTER — Other Ambulatory Visit: Payer: Self-pay

## 2019-03-12 ENCOUNTER — Encounter: Payer: Self-pay | Admitting: Family Medicine

## 2019-03-12 ENCOUNTER — Ambulatory Visit (INDEPENDENT_AMBULATORY_CARE_PROVIDER_SITE_OTHER): Payer: PPO | Admitting: Family Medicine

## 2019-03-12 VITALS — BP 138/88 | Temp 98.4°F | Wt 157.4 lb

## 2019-03-12 DIAGNOSIS — M7542 Impingement syndrome of left shoulder: Secondary | ICD-10-CM

## 2019-03-12 DIAGNOSIS — N289 Disorder of kidney and ureter, unspecified: Secondary | ICD-10-CM | POA: Diagnosis not present

## 2019-03-12 DIAGNOSIS — M1A9XX0 Chronic gout, unspecified, without tophus (tophi): Secondary | ICD-10-CM | POA: Diagnosis not present

## 2019-03-12 DIAGNOSIS — R29898 Other symptoms and signs involving the musculoskeletal system: Secondary | ICD-10-CM | POA: Diagnosis not present

## 2019-03-12 MED ORDER — ALLOPURINOL 100 MG PO TABS: ORAL_TABLET | ORAL | 3 refills | Status: DC

## 2019-03-12 MED ORDER — COLCHICINE 0.6 MG PO TABS: ORAL_TABLET | ORAL | 1 refills | Status: DC

## 2019-03-12 NOTE — Progress Notes (Signed)
   Subjective:    Patient ID: Joanna [name], female    DOB: Feb 08, 1951, 68 y.o.   MRN: [id number]  HPI Pt here today due to tremors in left hand. Pt states she feels like she is losing the use of left arm. No numbness, no tingling. This patient did have surgery on her neck they felt that the surgery went well but she has some residual weakness in the left hand as well as some intermittent tremors    Pt is also have rounds of gout in right toe. She has flareup of gout happens intermittently.  Recent lab work showed elevated uric acid   Pt also states that she is sleeping a little bit better.  Patient does have some insomnia issues recent medication helps she is under a lot of stress worries and thanks a lot about various issues    Review of Systems  Constitutional: Negative for activity change and appetite change.  HENT: Negative for congestion and rhinorrhea.   Respiratory: Negative for cough and shortness of breath.   Cardiovascular: Negative for chest pain and leg swelling.  Gastrointestinal: Negative for abdominal pain, nausea and vomiting.  Skin: Negative for color change.  Neurological: Positive for tremors. Negative for dizziness, weakness, light-headedness and numbness.  Psychiatric/Behavioral: Negative for agitation and confusion.       Objective:   Physical Exam Lungs clear respiratory rate normal heart regular impingement of left shoulder is noted but no sign of rotator cuff tear She does have some weakness in the left hand with some loss of dexterity and strength with also intermittent tremor   25 minutes was spent with the patient.  This statement verifies that 25 minutes was indeed spent with the patient.  More than 50% of this visit-total duration of the visit-was spent in counseling and coordination of care. The issues that the patient came in for today as reflected in the diagnosis (s) please refer to documentation for further details.     Assessment & Plan:   Patient does have a flare of gout intermittently.  She needs to get her uric acid level down Start allopurinol 100 mg daily Colchicine do it once daily for the first 2 weeks then after that twice daily as necessary in order to keep gout flareups under control Gout diet discussed  She will do lab work 6 weeks after initiating allopurinol.  Left shoulder impingement needs to see orthopedics for injections and possible physical therapy  Significant issues of left hand weakness with tremor in the left hand we would like to have Dr. Saintclair Halsted send [place] her OR notes as well as most recent visit it is possible that this is residual damage from the previous impingement on the nerves patient does have some weakness to the hand with some tremor may need to see neurology as well

## 2019-03-17 ENCOUNTER — Ambulatory Visit (HOSPITAL_COMMUNITY)
Admission: RE | Admit: 2019-03-17 | Discharge: 2019-03-17 | Disposition: A | Discharge status: Home / Self Care | Payer: PPO | Source: Ambulatory Visit | Attending: Nurse Practitioner | Admitting: Nurse Practitioner

## 2019-03-17 ENCOUNTER — Other Ambulatory Visit (HOSPITAL_COMMUNITY): Payer: Self-pay | Admitting: Nurse Practitioner

## 2019-03-17 ENCOUNTER — Other Ambulatory Visit: Payer: Self-pay

## 2019-03-17 DIAGNOSIS — Z1239 Encounter for other screening for malignant neoplasm of breast: Secondary | ICD-10-CM

## 2019-03-17 DIAGNOSIS — Z1231 Encounter for screening mammogram for malignant neoplasm of breast: Secondary | ICD-10-CM | POA: Diagnosis not present

## 2019-03-25 ENCOUNTER — Other Ambulatory Visit: Payer: Self-pay | Admitting: Family Medicine

## 2019-04-01 ENCOUNTER — Encounter: Payer: Self-pay | Admitting: Family Medicine

## 2019-04-15 ENCOUNTER — Other Ambulatory Visit: Payer: Self-pay | Admitting: Family Medicine

## 2019-04-15 ENCOUNTER — Telehealth: Payer: Self-pay

## 2019-04-15 ENCOUNTER — Telehealth: Payer: Self-pay | Admitting: Family Medicine

## 2019-04-15 MED ORDER — LORAZEPAM 0.5 MG PO TABS: ORAL_TABLET | ORAL | 3 refills | Status: DC

## 2019-04-15 NOTE — Telephone Encounter (Signed)
Prescription with 3 refills was sent in

## 2019-04-15 NOTE — Telephone Encounter (Signed)
Pharmacy requesting refill on Lorazepam 0.5 mg tablets. Take one tablet by mouth every night as needed. Pt last seen 03/12/2019 for left hand weakness. Please advise. Thank you

## 2019-04-15 NOTE — Telephone Encounter (Signed)
Called pt to schedule COVID test prior to TCS-/+EGD scheduled for 05/01/19. COVID test scheduled for 04/28/19 at 3:00pm. Pt is aware to quarantine after test until procedure. Appt letter mailed.

## 2019-04-16 DIAGNOSIS — M25512 Pain in left shoulder: Secondary | ICD-10-CM | POA: Diagnosis not present

## 2019-04-16 DIAGNOSIS — M7502 Adhesive capsulitis of left shoulder: Secondary | ICD-10-CM | POA: Diagnosis not present

## 2019-04-17 ENCOUNTER — Inpatient Hospital Stay (HOSPITAL_COMMUNITY): Payer: PPO | Attending: Hematology

## 2019-04-17 ENCOUNTER — Encounter (HOSPITAL_COMMUNITY): Payer: Self-pay | Admitting: Nurse Practitioner

## 2019-04-17 ENCOUNTER — Inpatient Hospital Stay (HOSPITAL_BASED_OUTPATIENT_CLINIC_OR_DEPARTMENT_OTHER): Payer: PPO | Admitting: Nurse Practitioner

## 2019-04-17 ENCOUNTER — Other Ambulatory Visit: Payer: Self-pay

## 2019-04-17 VITALS — BP 145/93 | HR 73 | Temp 97.3°F | Resp 16 | Wt 161.0 lb

## 2019-04-17 DIAGNOSIS — R5383 Other fatigue: Secondary | ICD-10-CM | POA: Insufficient documentation

## 2019-04-17 DIAGNOSIS — E876 Hypokalemia: Secondary | ICD-10-CM | POA: Diagnosis not present

## 2019-04-17 DIAGNOSIS — R911 Solitary pulmonary nodule: Secondary | ICD-10-CM | POA: Diagnosis not present

## 2019-04-17 DIAGNOSIS — Z87891 Personal history of nicotine dependence: Secondary | ICD-10-CM | POA: Insufficient documentation

## 2019-04-17 DIAGNOSIS — R51 Headache: Secondary | ICD-10-CM

## 2019-04-17 DIAGNOSIS — D500 Iron deficiency anemia secondary to blood loss (chronic): Secondary | ICD-10-CM | POA: Insufficient documentation

## 2019-04-17 DIAGNOSIS — D509 Iron deficiency anemia, unspecified: Secondary | ICD-10-CM

## 2019-04-17 DIAGNOSIS — Q2730 Arteriovenous malformation, site unspecified: Secondary | ICD-10-CM

## 2019-04-17 DIAGNOSIS — R634 Abnormal weight loss: Secondary | ICD-10-CM | POA: Diagnosis not present

## 2019-04-17 DIAGNOSIS — N189 Chronic kidney disease, unspecified: Secondary | ICD-10-CM

## 2019-04-17 LAB — IRON AND TIBC
Iron: 77 ug/dL (ref 28–170)
Saturation Ratios: 27 % (ref 10.4–31.8)
TIBC: 289 ug/dL (ref 250–450)
UIBC: 212 ug/dL

## 2019-04-17 LAB — VITAMIN B12: Vitamin B-12: 758 pg/mL (ref 180–914)

## 2019-04-17 LAB — CBC WITH DIFFERENTIAL/PLATELET
Abs Immature Granulocytes: 0.04 10*3/uL (ref 0.00–0.07)
Basophils Absolute: 0 10*3/uL (ref 0.0–0.1)
Basophils Relative: 1 %
Eosinophils Absolute: 0.1 10*3/uL (ref 0.0–0.5)
Eosinophils Relative: 1 %
HCT: 34.8 % — ABNORMAL LOW (ref 36.0–46.0)
Hemoglobin: 11.2 g/dL — ABNORMAL LOW (ref 12.0–15.0)
Immature Granulocytes: 1 %
Lymphocytes Relative: 34 %
Lymphs Abs: 2.1 10*3/uL (ref 0.7–4.0)
MCH: 28.9 pg (ref 26.0–34.0)
MCHC: 32.2 g/dL (ref 30.0–36.0)
MCV: 89.7 fL (ref 80.0–100.0)
Monocytes Absolute: 0.4 10*3/uL (ref 0.1–1.0)
Monocytes Relative: 6 %
Neutro Abs: 3.5 10*3/uL (ref 1.7–7.7)
Neutrophils Relative %: 57 %
Platelets: 270 10*3/uL (ref 150–400)
RBC: 3.88 MIL/uL (ref 3.87–5.11)
RDW: 14 % (ref 11.5–15.5)
WBC: 6.1 10*3/uL (ref 4.0–10.5)
nRBC: 0 % (ref 0.0–0.2)

## 2019-04-17 LAB — COMPREHENSIVE METABOLIC PANEL
ALT: 10 U/L (ref 0–44)
AST: 18 U/L (ref 15–41)
Albumin: 3.8 g/dL (ref 3.5–5.0)
Alkaline Phosphatase: 81 U/L (ref 38–126)
Anion gap: 14 (ref 5–15)
BUN: 23 mg/dL (ref 8–23)
CO2: 30 mmol/L (ref 22–32)
Calcium: 10.1 mg/dL (ref 8.9–10.3)
Chloride: 99 mmol/L (ref 98–111)
Creatinine, Ser: 1.31 mg/dL — ABNORMAL HIGH (ref 0.44–1.00)
GFR calc Af Amer: 49 mL/min — ABNORMAL LOW (ref >60)
GFR calc non Af Amer: 42 mL/min — ABNORMAL LOW (ref >60)
Glucose, Bld: 99 mg/dL (ref 70–99)
Potassium: 2.9 mmol/L — ABNORMAL LOW (ref 3.5–5.1)
Sodium: 143 mmol/L (ref 135–145)
Total Bilirubin: 0.5 mg/dL (ref 0.3–1.2)
Total Protein: 6.8 g/dL (ref 6.5–8.1)

## 2019-04-17 LAB — FERRITIN: Ferritin: 253 ng/mL (ref 11–307)

## 2019-04-17 LAB — LACTATE DEHYDROGENASE: LDH: 169 U/L (ref 98–192)

## 2019-04-17 LAB — FOLATE: Folate: 7.2 ng/mL (ref >5.9)

## 2019-04-17 MED ORDER — POTASSIUM CHLORIDE CRYS ER 20 MEQ PO TBCR
EXTENDED_RELEASE_TABLET | ORAL | Status: AC
  Filled 2019-04-17: qty 2

## 2019-04-17 MED ORDER — POTASSIUM CHLORIDE CRYS ER 20 MEQ PO TBCR
40.0000 meq | EXTENDED_RELEASE_TABLET | Freq: Once | ORAL | Status: AC
  Administered 2019-04-17: 40 meq via ORAL

## 2019-04-17 NOTE — Assessment & Plan Note (Addendum)
1.  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 -Her last Feraheme infusion was on 12/11/2018 and 12/18/2018. -Patient reports ice pica.  She denies any bleeding per rectum or melena. -She cannot tolerate oral iron because of constipation. -We talked about Procrit injections on her last visit. -Labs today on 04/17/2019 showed her hemoglobin 11.2, ferritin 253, percent saturation 27 - She will follow-up in 3 months with repeat labs.  2.  Hypokalemia: - Patient reports she has had problems with her potassium being low for months.  She is being followed by her PCP for this. -Her PCP recently prescribed her 20 mEq of potassium to take daily.  However she has not been taking this as prescribed. -Labs on 04/17/2019 showed her potassium 2.9.  She did not want to stay for IV potassium.  I prescribed 40 mEq of potassium today. She will also take 20 mEq at bedtime. She reports she will start taking this as prescribed.    3.  Smoking history: -She is a current active smoker, smokes half a pack per day for 50 years. -A low-dose CT scan was ordered but was declined due to insurance purposes.  4.  Health maintenance: -Mammogram was on 03/05/2018 was B RADS category 1 -We will go ahead and schedule her mammogram for after Mar 06, 2019. - Her last colonoscopy was in 2015.  She reports she has a follow-up colonoscopy in July 2020.  5.  Unexplained weight loss: - Patient has lost 47 pounds in less than a year. -She reports she is not have an appetite. -We ordered a CT scan of the CAP with oral contrast on 01/08/2019 which showed a solitary right lower lobe solid 4 mm pulmonary nodule, mildly increased in size from 2 mm in 2010.  Likely benign nodule due to the slow growth. - We will follow-up with a chest CT in 6 to 12 months from last scan.

## 2019-04-17 NOTE — Patient Instructions (Addendum)
[place] at [facility]  Discharge Instructions: Follow up in 3 months with labs   You saw Francene Finders, NP, today. _______________________________________________________________  Thank you for choosing [place] at [facility] to provide your oncology and hematology care.  To afford each patient quality time with our providers, please arrive at least 15 minutes before your scheduled appointment.  You need to re-schedule your appointment if you arrive 10 or more minutes late.  We strive to give you quality time with our providers, and arriving late affects you and other patients whose appointments are after yours.  Also, if you no show three or more times for appointments you may be dismissed from the clinic.  Again, thank you for choosing [place] [facility] [place] hope is that these requests will allow you access to exceptional care and in a timely manner. _______________________________________________________________  If you have questions after your visit, please contact our office at [phone] between the hours of 8:30 a.m. and 5:00 p.m. Voicemails left after 4:30 p.m. will not be returned until the following business day. _______________________________________________________________  For prescription refill requests, have your pharmacy contact our office. _______________________________________________________________  Recommendations made by the consultant and any test results will be sent to your referring physician. _______________________________________________________________

## 2019-04-17 NOTE — Progress Notes (Signed)
[name], [place] [address]   CLINIC:  Medical Oncology/Hematology  PCP:  [name], MD [address] [place] [address] [phone]   REASON FOR VISIT: Follow-up for iron deficiency anemia  CURRENT THERAPY: Intermittent iron infusions   INTERVAL HISTORY:  [name] 68 y.o. female returns for routine follow-up for iron deficiency anemia.  Reports she has been feeling great since her iron infusions.  She does still have mild fatigue however it is much improved.  She does get occasional headaches. Denies any nausea, vomiting, or diarrhea. Denies any new pains. Had not noticed any recent bleeding such as epistaxis, hematuria or hematochezia. Denies recent chest pain on exertion, shortness of breath on minimal exertion, pre-syncopal episodes, or palpitations. Denies any numbness or tingling in hands or feet. Denies any recent fevers, infections, or recent hospitalizations. Patient reports appetite at 75% and energy level at 25%.  She is eating well maintaining her weight at this time.    REVIEW OF SYSTEMS:  Review of Systems  Constitutional: Positive for fatigue.  Gastrointestinal: Positive for constipation.  Psychiatric/Behavioral: Positive for sleep disturbance.  All other systems reviewed and are negative.    PAST MEDICAL/SURGICAL HISTORY:  Past Medical History:  Diagnosis Date  . 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 Depression   . Diabetes mellitus   . Diverticulosis   . Dyslipidemia   . GERD (gastroesophageal reflux disease)   . Hypertension   . Insomnia   . Microproteinuria   . Renal insufficiency 2010  . Right carpal tunnel syndrome 12/24/2017  . Sleep apnea   . Sleep apnea 12/25/2017   Abnormal sleep study February 2019 CPAP ordered  . Thyroid disease    hypothyroid   Past Surgical History:  Procedure Laterality Date  . ABDOMINAL HYSTERECTOMY  1984  . BACK  SURGERY    . BILATERAL OOPHORECTOMY    . CERVICAL DISC SURGERY  01/16/2019  . COLONOSCOPY  01/29/12   Fields-2-3 hyperplastic polyps, mod internal hemorrhoids, diverticulosis throughout colon  . COLONOSCOPY N/A 07/03/2014   Dr. Oneida Alar: Hyperplastic polyps  . ESOPHAGOGASTRODUODENOSCOPY   01/29/2012   Fields-2-3cm sliding HH, fundic gland polyps, NEGATIVE H pylori  . ESOPHAGOGASTRODUODENOSCOPY N/A 07/03/2014   Dr. Oneida Alar: gastritis and normal small bowel biopsies  . GIVENS CAPSULE STUDY N/A 10/26/2014   SMALL BOWEL AVMs  . KNEE ARTHROSCOPY Right   . TOTAL ABDOMINAL HYSTERECTOMY W/ BILATERAL SALPINGOOPHORECTOMY       SOCIAL HISTORY:  Social History   Socioeconomic History  . Marital status: Divorced    Spouse name: Not on file  . Number of children: 2  . Years of education: Not on file  . Highest education level: Not on file  Occupational History  . Occupation: retired    Comment: 2014 or so  Social Needs  . Financial resource strain: Not on file  . Food insecurity    Worry: Not on file    Inability: Not on file  . Transportation needs    Medical: Not on file    Non-medical: Not on file  Tobacco Use  . Smoking status: Former Smoker    Packs/day: 0.25    Years: 30.00    Pack years: 7.50    Types: Cigarettes    Quit date: 10/16/2010    Years since quitting: 8.5  . Smokeless tobacco: Former Systems developer    Quit date: 10/28/2010  Substance and Sexual Activity  .  Alcohol use: Yes    Comment: socially, maybe 3 times a year   . Drug use: No  . Sexual activity: Not on file  Lifestyle  . Physical activity    Days per week: Not on file    Minutes per session: Not on file  . Stress: Not on file  Relationships  . Social Herbalist on phone: Not on file    Gets together: Not on file    Attends religious service: Not on file    Active member of club or organization: Not on file    Attends meetings of clubs or organizations: Not on file    Relationship status: Not on file   . Intimate partner violence    Fear of current or ex partner: Not on file    Emotionally abused: Not on file    Physically abused: Not on file    Forced sexual activity: Not on file  Other Topics Concern  . Not on file  Social History Narrative  . Not on file    FAMILY HISTORY:  Family History  Problem Relation Age of Onset  . Cardiomyopathy Father   . Heart failure Mother   . Hyperlipidemia Mother   . Stroke Brother   . Kidney cancer Brother   . Diabetes Paternal Uncle   . Heart disease Paternal Grandmother   . Colon cancer Neg Hx   . Colon polyps Neg Hx     CURRENT MEDICATIONS:  Outpatient Encounter Medications as of 04/17/2019  Medication Sig Note  . albuterol (PROVENTIL HFA;VENTOLIN HFA) 108 (90 Base) MCG/ACT inhaler Inhale 2 puffs into the lungs every 4 (four) hours as needed for wheezing or shortness of breath.   . allopurinol (ZYLOPRIM) 100 MG tablet Take one tablet by mouth daily   . ALPRAZolam (XANAX) 0.5 MG tablet 1/2 to 1 po BID prn anxiety   . amLODipine (NORVASC) 5 MG tablet Take 1 tablet (5 mg total) by mouth daily.   . colchicine 0.6 MG tablet 1 bid prn gout   . cyclobenzaprine (FLEXERIL) 10 MG tablet TK 1 T PO TID PRN   . ferrous sulfate 325 (65 FE) MG tablet Take 325 mg by mouth daily with breakfast.   . fexofenadine (ALLEGRA) 180 MG tablet Take 180 mg by mouth daily.   . fluticasone (FLONASE) 50 MCG/ACT nasal spray PLACE 2 SPRAYS INTO THE NOSE AS NEEDED. 05/05/2016: otc  . HYDROcodone-acetaminophen (NORCO/VICODIN) 5-325 MG tablet Take one tablet by mouth every 4 hours prn pain   . indapamide (LOZOL) 1.25 MG tablet TAKE 1 TABLET BY MOUTH EVERY MORNING   . Insulin Pen Needle 32G X 4 MM MISC Use with Victoza as directed   . levothyroxine (SYNTHROID) 88 MCG tablet Take 1/2 tablet by mouth on Mondays, wednesdays, and fridays and 1 tablet on all other days   . liraglutide (VICTOZA) 18 MG/3ML SOPN Inject 1.2mg  into the skin daily   . LORazepam (ATIVAN) 0.5 MG tablet  Take one tab by mouth each night as needed   . losartan (COZAAR) 100 MG tablet Take 1 tablet (100 mg total) by mouth daily.   . Magnesium 250 MG TABS Take 250 mg by mouth daily.    . Multiple Vitamin (MULTIVITAMIN) tablet Take 1 tablet by mouth daily.   Marland Kitchen omeprazole (PRILOSEC) 40 MG capsule Take 1 capsule (40 mg total) by mouth daily.   . potassium chloride (K-DUR) 10 MEQ tablet Take 1 tablet (10 mEq total) by mouth daily.   Marland Kitchen  pravastatin (PRAVACHOL) 40 MG tablet Take 1 tablet (40 mg total) by mouth daily.   . Simethicone (GAS-X PO) Take by mouth as needed.   . [EXPIRED] potassium chloride SA (K-DUR) CR tablet 40 mEq     No facility-administered encounter medications on file as of 04/17/2019.     ALLERGIES:  Allergies  Allergen Reactions  . Ivp Dye [Iodinated Diagnostic Agents]   . Zoloft [Sertraline Hcl]     Withdrawn,bad dreams     PHYSICAL EXAM:  ECOG Performance status: 1  Vitals:   04/17/19 1040  BP: (!) 145/93  Pulse: 73  Resp: 16  Temp: (!) 97.3 F (36.3 C)  SpO2: 100%   Filed Weights   04/17/19 1040  Weight: 161 lb (73 kg)    Physical Exam Constitutional:      Appearance: Normal appearance. She is normal weight.  Cardiovascular:     Rate and Rhythm: Normal rate and regular rhythm.     Heart sounds: Normal heart sounds.  Pulmonary:     Effort: Pulmonary effort is normal.     Breath sounds: Normal breath sounds.  Abdominal:     General: Bowel sounds are normal.     Palpations: Abdomen is soft.  Musculoskeletal: Normal range of motion.  Skin:    General: Skin is warm and dry.  Neurological:     Mental Status: She is alert and oriented to person, place, and time. Mental status is at baseline.  Psychiatric:        Mood and Affect: Mood normal.        Behavior: Behavior normal.        Thought Content: Thought content normal.        Judgment: Judgment normal.      LABORATORY DATA:  I have reviewed the labs as listed.  CBC    Component Value  Date/Time   WBC 6.1 04/17/2019 1014   RBC 3.88 04/17/2019 1014   HGB 11.2 (L) 04/17/2019 1014   HGB 10.1 (L) 11/13/2018 1205   HCT 34.8 (L) 04/17/2019 1014   HCT 32.5 (L) 11/13/2018 1205   PLT 270 04/17/2019 1014   PLT 366 11/13/2018 1205   MCV 89.7 04/17/2019 1014   MCV 80 11/13/2018 1205   MCH 28.9 04/17/2019 1014   MCHC 32.2 04/17/2019 1014   RDW 14.0 04/17/2019 1014   RDW 16.5 (H) 11/13/2018 1205   LYMPHSABS 2.1 04/17/2019 1014   LYMPHSABS 2.4 11/13/2018 1205   MONOABS 0.4 04/17/2019 1014   EOSABS 0.1 04/17/2019 1014   EOSABS 0.1 11/13/2018 1205   BASOSABS 0.0 04/17/2019 1014   BASOSABS 0.1 11/13/2018 1205   CMP Latest Ref Rng & Units 04/17/2019 02/13/2019 12/25/2018  Glucose 70 - 99 mg/dL 99 104(H) 91  BUN 8 - 23 mg/dL 23 19 20   Creatinine 0.44 - 1.00 mg/dL 1.31(H) 1.57(H) 1.86(H)  Sodium 135 - 145 mmol/L 143 142 150(H)  Potassium 3.5 - 5.1 mmol/L 2.9(L) 2.8(L) 3.1(L)  Chloride 98 - 111 mmol/L 99 104 107(H)  CO2 22 - 32 mmol/L 30 26 24   Calcium 8.9 - 10.3 mg/dL 10.1 9.9 10.1  Total Protein 6.5 - 8.1 g/dL 6.8 7.3 6.5  Total Bilirubin 0.3 - 1.2 mg/dL 0.5 0.3 <0.2  Alkaline Phos 38 - 126 U/L 81 98 96  AST 15 - 41 U/L 18 16 16   ALT 0 - 44 U/L 10 11 13     I personally performed a face-to-face visit.  All questions were answered to patient's stated satisfaction.  Encouraged patient to call with any new concerns or questions before his next visit to the cancer center and we can certain see him sooner, if needed.     ASSESSMENT & PLAN:   Microcytic anemia 1.  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 -Her last Feraheme infusion was on 12/11/2018 and 12/18/2018. -Patient reports ice pica.  She denies any bleeding per rectum or melena. -She cannot tolerate oral iron because of constipation. -We talked about Procrit injections on her last visit. -Labs today on 04/17/2019 showed her hemoglobin 11.2, ferritin 253,  percent saturation 27 - She will follow-up in 3 months with repeat labs.  2.  Hypokalemia: - Patient reports she has had problems with her potassium being low for months.  She is being followed by her PCP for this. -Her PCP recently prescribed her 20 mEq of potassium to take daily.  However she has not been taking this as prescribed. -Labs on 04/17/2019 showed her potassium 2.9.  She did not want to stay for IV potassium.  I prescribed 40 mEq of potassium today. She will also take 20 mEq at bedtime. She reports she will start taking this as prescribed.    3.  Smoking history: -She is a current active smoker, smokes half a pack per day for 50 years. -A low-dose CT scan was ordered but was declined due to insurance purposes.  4.  Health maintenance: -Mammogram was on 03/05/2018 was B RADS category 1 -We will go ahead and schedule her mammogram for after Mar 06, 2019. - Her last colonoscopy was in 2015.  She reports she has a follow-up colonoscopy in July 2020.  5.  Unexplained weight loss: - Patient has lost 47 pounds in less than a year. -She reports she is not have an appetite. -We ordered a CT scan of the CAP with oral contrast on 01/08/2019 which showed a solitary right lower lobe solid 4 mm pulmonary nodule, mildly increased in size from 2 mm in 2010.  Likely benign nodule due to the slow growth. - We will follow-up with a chest CT in 6 to 12 months from last scan.      Orders placed this encounter:  Orders Placed This Encounter  Procedures  . Lactate dehydrogenase  . CBC with Differential/Platelet  . Comprehensive metabolic panel  . Ferritin  . Iron and TIBC  . Vitamin B12  . VITAMIN D 25 Hydroxy (Vit-D Deficiency, Fractures)  . Folate      Francene Finders, FNP-C [place] [phone]

## 2019-04-18 LAB — VITAMIN D 25 HYDROXY (VIT D DEFICIENCY, FRACTURES): Vit D, 25-Hydroxy: 32.7 ng/mL (ref 30.0–100.0)

## 2019-04-22 DIAGNOSIS — M7502 Adhesive capsulitis of left shoulder: Secondary | ICD-10-CM | POA: Diagnosis not present

## 2019-04-24 DIAGNOSIS — R29898 Other symptoms and signs involving the musculoskeletal system: Secondary | ICD-10-CM | POA: Diagnosis not present

## 2019-04-24 DIAGNOSIS — M79642 Pain in left hand: Secondary | ICD-10-CM | POA: Insufficient documentation

## 2019-04-25 DIAGNOSIS — M7502 Adhesive capsulitis of left shoulder: Secondary | ICD-10-CM | POA: Diagnosis not present

## 2019-04-28 ENCOUNTER — Other Ambulatory Visit: Payer: Self-pay

## 2019-04-28 ENCOUNTER — Other Ambulatory Visit (HOSPITAL_COMMUNITY)
Admission: RE | Admit: 2019-04-28 | Discharge: 2019-04-28 | Disposition: A | Discharge status: Home / Self Care | Payer: PPO | Source: Ambulatory Visit | Attending: Gastroenterology | Admitting: Gastroenterology

## 2019-04-29 ENCOUNTER — Telehealth: Payer: Self-pay

## 2019-04-29 ENCOUNTER — Other Ambulatory Visit: Payer: Self-pay

## 2019-04-29 DIAGNOSIS — D509 Iron deficiency anemia, unspecified: Secondary | ICD-10-CM

## 2019-04-29 NOTE — Telephone Encounter (Signed)
If she desires only colonoscopy, she can do that. We were doing TCS/EGD due to recurrent IDA to exclude any occult GI etiology. IDA likely to history of small bowel AVMs but she has also noted unintentional weight loss. Colonoscopy at a minimum recommended. No obvious upper GI signs/symptoms of concern.

## 2019-04-29 NOTE — Telephone Encounter (Signed)
Called pt, she wants to reschedule TCS but doesn't want to have EGD done because she had surgery on her neck and throat has been sore.  [name], is it ok for pt to have TCS only?

## 2019-04-29 NOTE — Telephone Encounter (Signed)
Called pt, TCS rescheduled to 06/06/19 at 9:15am. COVID test 06/04/19 at 3:00pm. Pt aware to quarantine at home after test until procedure. Orders entered.

## 2019-04-29 NOTE — Telephone Encounter (Signed)
Endo scheduler said yesterday pt wanted to reschedule TCS-/+EGD w/SLF that was for 05/01/19. She canceled procedure and advised pt to call office to reschedule.

## 2019-04-30 ENCOUNTER — Telehealth: Payer: Self-pay | Admitting: Family Medicine

## 2019-04-30 MED ORDER — BLOOD GLUCOSE MONITOR KIT: PACK | 0 refills | Status: DC

## 2019-04-30 NOTE — Telephone Encounter (Signed)
Her glucose meter broke and she needs an RX for a new one. Insurance will pay for a one touch or freestyle.  [facility] [address]

## 2019-04-30 NOTE — Telephone Encounter (Signed)
Appt letter and new procedure instructions mailed.

## 2019-04-30 NOTE — Telephone Encounter (Signed)
plz do plus strips

## 2019-04-30 NOTE — Telephone Encounter (Signed)
Await signature 

## 2019-05-01 ENCOUNTER — Other Ambulatory Visit: Payer: Self-pay | Admitting: Family Medicine

## 2019-05-01 ENCOUNTER — Encounter (HOSPITAL_COMMUNITY): Admission: RE | Payer: Self-pay | Source: Home / Self Care

## 2019-05-01 ENCOUNTER — Ambulatory Visit (HOSPITAL_COMMUNITY): Admission: RE | Admit: 2019-05-01 | Payer: PPO | Source: Home / Self Care | Admitting: Gastroenterology

## 2019-05-01 DIAGNOSIS — M7502 Adhesive capsulitis of left shoulder: Secondary | ICD-10-CM | POA: Diagnosis not present

## 2019-05-01 SURGERY — COLONOSCOPY
Anesthesia: Moderate Sedation

## 2019-05-01 NOTE — Telephone Encounter (Signed)
Prescription faxed to pharmacy. Patient notified. 

## 2019-05-02 DIAGNOSIS — R29898 Other symptoms and signs involving the musculoskeletal system: Secondary | ICD-10-CM | POA: Diagnosis not present

## 2019-05-05 DIAGNOSIS — R29898 Other symptoms and signs involving the musculoskeletal system: Secondary | ICD-10-CM | POA: Diagnosis not present

## 2019-05-06 DIAGNOSIS — M7502 Adhesive capsulitis of left shoulder: Secondary | ICD-10-CM | POA: Diagnosis not present

## 2019-05-07 DIAGNOSIS — I10 Essential (primary) hypertension: Secondary | ICD-10-CM | POA: Diagnosis not present

## 2019-05-07 DIAGNOSIS — E039 Hypothyroidism, unspecified: Secondary | ICD-10-CM | POA: Diagnosis not present

## 2019-05-08 DIAGNOSIS — M7502 Adhesive capsulitis of left shoulder: Secondary | ICD-10-CM | POA: Diagnosis not present

## 2019-05-08 LAB — BASIC METABOLIC PANEL
BUN/Creatinine Ratio: 15 (ref 12–28)
BUN: 25 mg/dL (ref 8–27)
CO2: 23 mmol/L (ref 20–29)
Calcium: 10.3 mg/dL (ref 8.7–10.3)
Chloride: 106 mmol/L (ref 96–106)
Creatinine, Ser: 1.65 mg/dL — ABNORMAL HIGH (ref 0.57–1.00)
GFR calc Af Amer: 37 mL/min/{1.73_m2} — ABNORMAL LOW (ref >59)
GFR calc non Af Amer: 32 mL/min/{1.73_m2} — ABNORMAL LOW (ref >59)
Glucose: 77 mg/dL (ref 65–99)
Potassium: 3.8 mmol/L (ref 3.5–5.2)
Sodium: 146 mmol/L — ABNORMAL HIGH (ref 134–144)

## 2019-05-08 LAB — TSH: TSH: 6.27 u[IU]/mL — ABNORMAL HIGH (ref 0.450–4.500)

## 2019-05-09 DIAGNOSIS — R29898 Other symptoms and signs involving the musculoskeletal system: Secondary | ICD-10-CM | POA: Diagnosis not present

## 2019-05-12 ENCOUNTER — Other Ambulatory Visit: Payer: Self-pay | Admitting: *Deleted

## 2019-05-12 ENCOUNTER — Telehealth: Payer: Self-pay | Admitting: *Deleted

## 2019-05-12 DIAGNOSIS — E785 Hyperlipidemia, unspecified: Secondary | ICD-10-CM

## 2019-05-12 DIAGNOSIS — E039 Hypothyroidism, unspecified: Secondary | ICD-10-CM

## 2019-05-12 DIAGNOSIS — I10 Essential (primary) hypertension: Secondary | ICD-10-CM

## 2019-05-12 DIAGNOSIS — E119 Type 2 diabetes mellitus without complications: Secondary | ICD-10-CM

## 2019-05-12 DIAGNOSIS — M109 Gout, unspecified: Secondary | ICD-10-CM

## 2019-05-12 MED ORDER — LEVOTHYROXINE SODIUM 88 MCG PO TABS: ORAL_TABLET | ORAL | 1 refills | Status: DC

## 2019-05-12 MED ORDER — TOPIRAMATE 50 MG PO TABS: 50.0000 mg | ORAL_TABLET | Freq: Two times a day (BID) | ORAL | 1 refills | Status: DC

## 2019-05-12 NOTE — Telephone Encounter (Signed)
Pt calling to get refill on topiramate 50mg . Not on current med list but under med history one bid. Last sent in aug 2019 with 3 refills. Pt states she stopped taking for awhile because her headaches went away and now she is back to having them every day again.  Uses mail order pharm in her chart. Next med check is due mid sept.

## 2019-05-12 NOTE — Telephone Encounter (Signed)
Med sent to pharm. Pt notified.  

## 2019-05-12 NOTE — Telephone Encounter (Signed)
May do topiramate 50 mg, 1 twice daily, 90-day supply, 1 refill

## 2019-05-13 ENCOUNTER — Other Ambulatory Visit: Payer: Self-pay | Admitting: Family Medicine

## 2019-05-13 DIAGNOSIS — M25512 Pain in left shoulder: Secondary | ICD-10-CM | POA: Insufficient documentation

## 2019-05-13 DIAGNOSIS — M7502 Adhesive capsulitis of left shoulder: Secondary | ICD-10-CM | POA: Diagnosis not present

## 2019-05-15 DIAGNOSIS — M7502 Adhesive capsulitis of left shoulder: Secondary | ICD-10-CM | POA: Diagnosis not present

## 2019-05-16 DIAGNOSIS — R29898 Other symptoms and signs involving the musculoskeletal system: Secondary | ICD-10-CM | POA: Diagnosis not present

## 2019-05-20 DIAGNOSIS — M7502 Adhesive capsulitis of left shoulder: Secondary | ICD-10-CM | POA: Diagnosis not present

## 2019-05-20 DIAGNOSIS — M79642 Pain in left hand: Secondary | ICD-10-CM | POA: Diagnosis not present

## 2019-05-23 DIAGNOSIS — R29898 Other symptoms and signs involving the musculoskeletal system: Secondary | ICD-10-CM | POA: Diagnosis not present

## 2019-05-30 DIAGNOSIS — R29898 Other symptoms and signs involving the musculoskeletal system: Secondary | ICD-10-CM | POA: Diagnosis not present

## 2019-06-02 ENCOUNTER — Other Ambulatory Visit (HOSPITAL_COMMUNITY): Payer: Self-pay | Admitting: *Deleted

## 2019-06-02 ENCOUNTER — Other Ambulatory Visit (HOSPITAL_COMMUNITY): Payer: Self-pay

## 2019-06-04 ENCOUNTER — Other Ambulatory Visit: Payer: Self-pay

## 2019-06-04 ENCOUNTER — Other Ambulatory Visit (HOSPITAL_COMMUNITY)
Admission: RE | Admit: 2019-06-04 | Discharge: 2019-06-04 | Disposition: A | Discharge status: Home / Self Care | Payer: PPO | Source: Ambulatory Visit | Attending: Gastroenterology | Admitting: Gastroenterology

## 2019-06-04 DIAGNOSIS — Z01812 Encounter for preprocedural laboratory examination: Secondary | ICD-10-CM | POA: Diagnosis not present

## 2019-06-04 DIAGNOSIS — Z20828 Contact with and (suspected) exposure to other viral communicable diseases: Secondary | ICD-10-CM | POA: Diagnosis not present

## 2019-06-04 LAB — SARS CORONAVIRUS 2 (TAT 6-24 HRS): SARS Coronavirus 2: NEGATIVE

## 2019-06-06 ENCOUNTER — Ambulatory Visit (HOSPITAL_COMMUNITY)
Admission: RE | Admit: 2019-06-06 | Discharge: 2019-06-06 | Disposition: A | Discharge status: Home / Self Care | Payer: PPO | Attending: Gastroenterology | Admitting: Gastroenterology

## 2019-06-06 ENCOUNTER — Telehealth: Payer: Self-pay | Admitting: Gastroenterology

## 2019-06-06 ENCOUNTER — Encounter (HOSPITAL_COMMUNITY): Payer: Self-pay | Admitting: *Deleted

## 2019-06-06 ENCOUNTER — Other Ambulatory Visit: Payer: Self-pay

## 2019-06-06 ENCOUNTER — Encounter (HOSPITAL_COMMUNITY)
Admission: RE | Disposition: A | Discharge status: Home / Self Care | Payer: Self-pay | Source: Home / Self Care | Attending: Gastroenterology

## 2019-06-06 DIAGNOSIS — K644 Residual hemorrhoidal skin tags: Secondary | ICD-10-CM | POA: Insufficient documentation

## 2019-06-06 DIAGNOSIS — G4730 Sleep apnea, unspecified: Secondary | ICD-10-CM | POA: Insufficient documentation

## 2019-06-06 DIAGNOSIS — Q438 Other specified congenital malformations of intestine: Secondary | ICD-10-CM | POA: Insufficient documentation

## 2019-06-06 DIAGNOSIS — D509 Iron deficiency anemia, unspecified: Secondary | ICD-10-CM | POA: Diagnosis present

## 2019-06-06 DIAGNOSIS — K219 Gastro-esophageal reflux disease without esophagitis: Secondary | ICD-10-CM | POA: Diagnosis not present

## 2019-06-06 DIAGNOSIS — D500 Iron deficiency anemia secondary to blood loss (chronic): Secondary | ICD-10-CM | POA: Diagnosis not present

## 2019-06-06 DIAGNOSIS — E119 Type 2 diabetes mellitus without complications: Secondary | ICD-10-CM | POA: Insufficient documentation

## 2019-06-06 DIAGNOSIS — Z79890 Hormone replacement therapy (postmenopausal): Secondary | ICD-10-CM | POA: Diagnosis not present

## 2019-06-06 DIAGNOSIS — K648 Other hemorrhoids: Secondary | ICD-10-CM | POA: Diagnosis not present

## 2019-06-06 DIAGNOSIS — K621 Rectal polyp: Secondary | ICD-10-CM | POA: Diagnosis not present

## 2019-06-06 DIAGNOSIS — E785 Hyperlipidemia, unspecified: Secondary | ICD-10-CM | POA: Diagnosis not present

## 2019-06-06 DIAGNOSIS — F17210 Nicotine dependence, cigarettes, uncomplicated: Secondary | ICD-10-CM | POA: Diagnosis not present

## 2019-06-06 DIAGNOSIS — I10 Essential (primary) hypertension: Secondary | ICD-10-CM | POA: Insufficient documentation

## 2019-06-06 DIAGNOSIS — D175 Benign lipomatous neoplasm of intra-abdominal organs: Secondary | ICD-10-CM | POA: Insufficient documentation

## 2019-06-06 DIAGNOSIS — K5730 Diverticulosis of large intestine without perforation or abscess without bleeding: Secondary | ICD-10-CM | POA: Insufficient documentation

## 2019-06-06 HISTORY — PX: POLYPECTOMY: SHX5525

## 2019-06-06 HISTORY — PX: COLONOSCOPY: SHX5424

## 2019-06-06 LAB — GLUCOSE, CAPILLARY: Glucose-Capillary: 91 mg/dL (ref 70–99)

## 2019-06-06 SURGERY — COLONOSCOPY
Anesthesia: Moderate Sedation

## 2019-06-06 MED ORDER — SODIUM CHLORIDE 0.9 % IV SOLN
INTRAVENOUS | Status: DC
  Administered 2019-06-06: 1000 mL via INTRAVENOUS

## 2019-06-06 MED ORDER — MEPERIDINE HCL 100 MG/ML IJ SOLN
INTRAMUSCULAR | Status: DC | PRN
  Administered 2019-06-06: 50 mg via INTRAVENOUS

## 2019-06-06 MED ORDER — MIDAZOLAM HCL 5 MG/5ML IJ SOLN
INTRAMUSCULAR | Status: DC | PRN
  Administered 2019-06-06: 2 mg via INTRAVENOUS
  Administered 2019-06-06: 1 mg via INTRAVENOUS

## 2019-06-06 MED ORDER — MIDAZOLAM HCL 5 MG/5ML IJ SOLN
INTRAMUSCULAR | Status: AC
  Filled 2019-06-06: qty 10

## 2019-06-06 MED ORDER — MEPERIDINE HCL 100 MG/ML IJ SOLN
INTRAMUSCULAR | Status: AC
  Filled 2019-06-06: qty 2

## 2019-06-06 MED ORDER — STERILE WATER FOR IRRIGATION IR SOLN
Status: DC | PRN
  Administered 2019-06-06: 1.5 mL

## 2019-06-06 NOTE — Op Note (Signed)
[facility] Patient Name: Joanna [name] Procedure Date: 06/06/2019 8:52 AM MRN: [id number] Date of Birth: [birth year] Attending MD: Barney Drain MD, MD CSN: [id number] Age: 68 Admit Type: Outpatient Procedure:                Colonoscopy WITH COLD SNARE POLYPECTOMY Indications:              Iron deficiency anemia secondary to chronic blood                            loss Providers:                Barney Drain MD, MD, Charlsie Quest. Theda Sers RN, RN,                            Nelma Rothman, Technician Referring MD:             [name]. [name] Medicines:                Meperidine 50 mg IV, Midazolam 3 mg IV Complications:            No immediate complications. Estimated Blood Loss:     Estimated blood loss was minimal. Procedure:                Pre-Anesthesia Assessment:                           - Prior to the procedure, a History and Physical                            was performed, and patient medications and                            allergies were reviewed. The patient's tolerance of                            previous anesthesia was also reviewed. The risks                            and benefits of the procedure and the sedation                            options and risks were discussed with the patient.                            All questions were answered, and informed consent                            was obtained. Prior Anticoagulants: The patient has                            taken no previous anticoagulant or antiplatelet                            agents except for NSAID medication. ASA Grade  Assessment: II - A patient with mild systemic                            disease. After reviewing the risks and benefits,                            the patient was deemed in satisfactory condition to                            undergo the procedure. After obtaining informed                            consent, the colonoscope was passed under direct                  vision. Throughout the procedure, the patient's                            blood pressure, pulse, and oxygen saturations were                            monitored continuously. The [id number]-[id number] ([id number])                            scope was introduced through the anus and advanced                            to the 5 cm into the ileum. The colonoscopy was                            somewhat difficult due to a tortuous colon.                            Successful completion of the procedure was aided by                            straightening and shortening the scope to obtain                            bowel loop reduction and COLOWRAP. The patient                            tolerated the procedure well. The quality of the                            bowel preparation was good. The terminal ileum,                            ileocecal valve, appendiceal orifice, and rectum                            were photographed. Scope In: 9:40:43 AM Scope Out: 10:07:18 AM Scope Withdrawal Time: 0 hours 22 minutes 28 seconds  Total Procedure Duration: 0 hours 26 minutes 35 seconds  Findings:      The terminal ileum appeared normal.      A 6 mm polyp was found in the rectum. The polyp was sessile. The polyp       was removed with a hot snare. Resection and retrieval were complete.      Multiple small and large-mouthed diverticula were found in the entire       colon.      External and internal hemorrhoids were found. The hemorrhoids were       moderate. Impression:               - The examined portion of the ileum was normal.                           - One 6 mm polyp in the rectum, removed with a hot                            snare. Resected and retrieved.                           - MODERATE Diverticulosis in the entire examined                            colon.                           - External and internal hemorrhoids. Moderate Sedation:      Moderate (conscious) sedation was  administered by the endoscopy nurse       and supervised by the endoscopist. The following parameters were       monitored: oxygen saturation, heart rate, blood pressure, and response       to care. Total physician intraservice time was 37 minutes. Recommendation:           - Patient has a contact number available for                            emergencies. The signs and symptoms of potential                            delayed complications were discussed with the                            patient. Return to normal activities tomorrow.                            Written discharge instructions were provided to the                            patient.                           - High fiber diet.                           - Continue present medications.                           -  Await pathology results.                           - Repeat colonoscopy date to be determined after                            pending pathology results are reviewed for                            surveillance. Procedure Code(s):        --- Professional ---                           [id number], Colonoscopy, flexible; with removal of                            tumor(s), polyp(s), or other lesion(s) by snare                            technique                           99153, Moderate sedation; each additional 15                            minutes intraservice time                           G0500, Moderate sedation services provided by the                            same physician or other qualified health care                            professional performing a gastrointestinal                            endoscopic service that sedation supports,                            requiring the presence of an independent trained                            observer to assist in the monitoring of the                            patient's level of consciousness and physiological                            status; initial 15  minutes of intra-service time;                            patient age 37 years or older (additional time may                            be reported with [phone],  as appropriate) Diagnosis Code(s):        --- Professional ---                           K64.8, Other hemorrhoids                           K62.1, Rectal polyp                           D50.0, Iron deficiency anemia secondary to blood                            loss (chronic)                           K57.30, Diverticulosis of large intestine without                            perforation or abscess without bleeding CPT copyright 2019 [employer]. All rights reserved. The codes documented in this report are preliminary and upon coder review may  be revised to meet current compliance requirements. Barney Drain, MD Barney Drain MD, MD 06/06/2019 53:29:92 AM This report has been signed electronically. Number of Addenda: 0

## 2019-06-06 NOTE — Telephone Encounter (Signed)
OPV IN 4 MOS DX: SMALL BOWEL AVMs/ANEMIA.

## 2019-06-06 NOTE — Discharge Instructions (Signed)
You have internal AND EXTERNAL hemorrhoids and diverticulosis IN YOUR LEFT AND RIGHT COLON. YOU HAD ONE SMALL POLYP REMOVED. YOUR LOW IRON/BLOOD COUNT IS DUE TO SMALL BOWEL AVMs.   TO DECREASE THE RISK OF BLEEDING, STRICTLY AVOID ASPIRIN, BC/GOODY POWDERS, IBUPROFEN/MOTRIN, OR NAPROXEN/ALEVE.   EAT TO LIVE AND THINK OF FOOD AS MEDICINE.  DRINK WATER TO KEEP YOUR URINE LIGHT YELLOW.  To have more energy and to lose weight:      1. CONTINUE YOUR WEIGHT LOSS EFFORTS.     2. FOLLOW A HIGH FIBER DIET. AVOID ITEMS THAT CAUSE BLOATING. If you must eat bread, EAT EZEKIEL BREAD. IT IS IN THE FROZEN SECTION OF THE GROCERY STORE.    3. Do not drink SODA, GATORADE, ENERGY DRINKS, OR DIET SODA.     4. AVOID HIGH FRUCTOSE CORN SYRUP.     5. DO NOT chew SUGAR FREE GUM OR USE ARTIFICIAL SWEETENERS. USE STEVIA AS A SWEETENER.    6. DO NOT EAT ENRICHED WHEAT FLOUR, PASTA, RICE, OR CEREAL.    7. DO NOT EAT FARM RAISED FISH. ONLY EAT WILD CAUGHT SEAFOOD, GRASS FED BEEF OR CHICKEN, PASTURE RAISED PORK, OR EGGS FROM PASTURE RAISED CHICKENS.    8. PRACTICE CHAIR YOGA FOR 15-30 MINS 3 OR 4 TIMES A WEEK AND PROGRESS TO HATHA YOGA OVER NEXT 6 MOS.    9. TAKE MULTIVITAMIN, VITAMIN B12, AND VITAMIN D3 2000 IU DAILY.   COMPLETE NEXT COLONOSCOPY IN 10 YEARS.   Please CALL or SEND me A MY CHART MESSAGE IF YOU HAVE RECTAL BLEEDING/BLACK TARRY STOOLS OR IF YOU HAVE QUESTIONS OR CONCERNS.  KEEP YOUR APPOINTMENT WITH HEMATOLOGY TO MANAGE YOUR BLOOD COUNT.  FOLLOW UP IN 4 MOS.    Colonoscopy Care After Read the instructions outlined below and refer to this sheet in the next week. These discharge instructions provide you with general information on caring for yourself after you leave the hospital. While your treatment has been planned according to the most current medical practices available, unavoidable complications occasionally occur. If you have any problems or questions after discharge, call DR. Brysen Shankman,  [phone].  ACTIVITY  You may resume your regular activity, but move at a slower pace for the next 24 hours.   Take frequent rest periods for the next 24 hours.   Walking will help get rid of the air and reduce the bloated feeling in your belly (abdomen).   No driving for 24 hours (because of the medicine (anesthesia) used during the test).   You may shower.   Do not sign any important legal documents or operate any machinery for 24 hours (because of the anesthesia used during the test).    NUTRITION  Drink plenty of fluids.   You may resume your normal diet as instructed by your doctor.   Begin with a light meal and progress to your normal diet. Heavy or fried foods are harder to digest and may make you feel sick to your stomach (nauseated).   Avoid alcoholic beverages for 24 hours or as instructed.    MEDICATIONS  You may resume your normal medications.   WHAT YOU CAN EXPECT TODAY  Some feelings of bloating in the abdomen.   Passage of more gas than usual.   Spotting of blood in your stool or on the toilet paper  .  IF YOU HAD POLYPS REMOVED DURING THE COLONOSCOPY:  Eat a soft diet IF YOU HAVE NAUSEA, BLOATING, ABDOMINAL PAIN, OR VOMITING.    FINDING OUT  THE RESULTS OF YOUR TEST Not all test results are available during your visit. DR. Oneida Alar WILL CALL YOU WITHIN 14 DAYS OF YOUR PROCEDUE WITH YOUR RESULTS. Do not assume everything is normal if you have not heard from DR. Tirrell Buchberger, CALL HER OFFICE AT [phone].  SEEK IMMEDIATE MEDICAL ATTENTION AND CALL THE OFFICE: [phone] IF:  You have more than a spotting of blood in your stool.   Your belly is swollen (abdominal distention).   You are nauseated or vomiting.   You have a temperature over 101F.   You have abdominal pain or discomfort that is severe or gets worse throughout the day.   Arteriovenous Malformation An arteriovenous malformation (AVM) is a disorder that CAUSE BLOOD VESSELS TO BE ON  THE SURFACE OF YOUR GI TRACT. It is characterized by a complex, tangled web of arteries and veins THAT HAVE A TENDENCY TO BLEED, ESPECIALLY IF YOU USE . An AVM may occur in the STOMACH, COLON, OR SMALL BOWEL.  SYMPTOMS  The most common problems (symptoms) of AVM include:  Bleeding (hemorrhaging).   ANEMIA  TREATMENT  The treatment for AVMs: **ABLATION WITH HEAT  High-Fiber Diet A high-fiber diet changes your normal diet to include more whole grains, legumes, fruits, and vegetables. Changes in the diet involve replacing refined carbohydrates with unrefined foods. The calorie level of the diet is essentially unchanged. The Dietary Reference Intake (recommended amount) for adult males is 38 grams per day. For adult females, it is 25 grams per day. Pregnant and lactating women should consume 28 grams of fiber per day. Fiber is the intact part of a plant that is not broken down during digestion. Functional fiber is fiber that has been isolated from the plant to provide a beneficial effect in the body.  PURPOSE  Increase stool bulk.   Ease and regulate bowel movements.   Lower cholesterol.   REDUCE RISK OF COLON CANCER  INDICATIONS THAT YOU NEED MORE FIBER  Constipation and hemorrhoids.   Uncomplicated diverticulosis (intestine condition) and irritable bowel syndrome.   Weight management.   As a protective measure against hardening of the arteries (atherosclerosis), diabetes, and cancer.   GUIDELINES FOR INCREASING FIBER IN THE DIET  Start adding fiber to the diet slowly. A gradual increase of about 5 more grams (2 servings of most fruits or vegetables) per day is best. Too rapid an increase in fiber may result in constipation, flatulence, and bloating.   Drink enough water and fluids to keep your urine clear or pale yellow. Water, juice, or caffeine-free drinks are recommended. Not drinking enough fluid may cause constipation.   Eat a variety of high-fiber foods rather than one  type of fiber.   Try to increase your intake of fiber through using high-fiber foods rather than fiber pills or supplements that contain small amounts of fiber.   The goal is to change the types of food eaten. Do not supplement your present diet with high-fiber foods, but replace foods in your present diet.   INCLUDE A VARIETY OF FIBER SOURCES  Many fruits and vegetables are all good sources of fiber. These include: broccoli, Brussels sprouts, cabbage, cauliflower, beets, sweet potatoes, white potatoes (skin on), carrots, tomatoes, eggplant, squash, berries, fresh fruits, and dried fruits.   You may need to include additional fruits and vegetables each day.

## 2019-06-06 NOTE — H&P (Signed)
Primary Care Physician:  [name], MD Primary Gastroenterologist:  Dr. Oneida Alar  Pre-Procedure History & Physical: HPI:  Joanna [name] is a 68 y.o. female here for   Past Medical History:  Diagnosis Date  . 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 Depression   . Diabetes mellitus   . Diverticulosis   . Dyslipidemia   . GERD (gastroesophageal reflux disease)   . Hypertension   . Insomnia   . Microproteinuria   . Renal insufficiency 2010  . Right carpal tunnel syndrome 12/24/2017  . Sleep apnea   . Sleep apnea 12/25/2017   Abnormal sleep study February 2019 CPAP ordered  . Thyroid disease    hypothyroid    Past Surgical History:  Procedure Laterality Date  . ABDOMINAL HYSTERECTOMY  1984  . BACK SURGERY    . BILATERAL OOPHORECTOMY    . CERVICAL DISC SURGERY  01/16/2019  . COLONOSCOPY  01/29/12   Diona Peregoy-2-3 hyperplastic polyps, mod internal hemorrhoids, diverticulosis throughout colon  . COLONOSCOPY N/A 07/03/2014   Dr. Oneida Alar: Hyperplastic polyps  . ESOPHAGOGASTRODUODENOSCOPY   01/29/2012   Damany Eastman-2-3cm sliding HH, fundic gland polyps, NEGATIVE H pylori  . ESOPHAGOGASTRODUODENOSCOPY N/A 07/03/2014   Dr. Oneida Alar: gastritis and normal small bowel biopsies  . GIVENS CAPSULE STUDY N/A 10/26/2014   SMALL BOWEL AVMs  . KNEE ARTHROSCOPY Right   . TOTAL ABDOMINAL HYSTERECTOMY W/ BILATERAL SALPINGOOPHORECTOMY      Prior to Admission medications   Medication Sig Start Date End Date Taking? Authorizing Provider  allopurinol (ZYLOPRIM) 100 MG tablet Take one tablet by mouth daily Patient taking differently: Take 100 mg by mouth daily.  03/12/19  Yes [name], [name], MD  amLODipine (NORVASC) 5 MG tablet Take 1 tablet (5 mg total) by mouth daily. 02/17/19  Yes [name], MD  colchicine 0.6 MG tablet 1 bid prn gout Patient taking differently: Take 0.6 mg by mouth 2 (two) times daily as needed (gout flare ups).  03/12/19  Yes [name], [name], MD   Cyanocobalamin (VITAMIN B-12 PO) Take 1 tablet by mouth daily.   Yes [provider]  ferrous sulfate 325 (65 FE) MG tablet Take 325 mg by mouth daily with breakfast.   Yes [provider]  fexofenadine (ALLEGRA) 180 MG tablet Take 180 mg by mouth daily.   Yes [provider]  HYDROcodone-acetaminophen (NORCO/VICODIN) 5-325 MG tablet Take one tablet by mouth every 4 hours prn pain Patient taking differently: Take 1 tablet by mouth every 4 (four) hours as needed (pain.). Take one tablet by mouth every 4 hours prn pain 02/17/19  Yes [name], [name], MD  indapamide (LOZOL) 1.25 MG tablet TAKE 1 TABLET BY MOUTH EVERY MORNING Patient taking differently: Take 1.25 mg by mouth daily.  02/17/19  Yes [name], MD  levothyroxine (SYNTHROID) 88 MCG tablet Take 1/2 tablet by mouth on Mondays and fridays and 1 tablet on all other days Patient taking differently: Take 44-88 mcg by mouth See admin instructions. Take 1/2 tablet by mouth on Mondays  and 1 tablet on all other days 05/12/19  Yes [name], [name], MD  liraglutide (VICTOZA) 18 MG/3ML SOPN Inject 1.71m into the skin daily Patient taking differently: Inject 1.2 mg into the skin at bedtime.  03/25/19  Yes [name], SElayne Snare MD  LORazepam (ATIVAN) 0.5 MG tablet Take one tab by mouth each night as needed Patient taking differently: Take 0.5 mg by mouth at bedtime as needed for  anxiety or sleep.  04/15/19  Yes [name], MD  losartan (COZAAR) 100 MG tablet TAKE 1 TABLET BY MOUTH EVERY DAY 05/13/19  Yes [name], [name], MD  Magnesium 250 MG TABS Take 250 mg by mouth daily.    Yes [provider]  omeprazole (PRILOSEC) 40 MG capsule Take 1 capsule (40 mg total) by mouth daily. 02/06/19  Yes [name], MD  ONETOUCH VERIO test strip TEST ONCE DAILY 05/01/19  Yes [name], MD  potassium chloride (K-DUR) 10 MEQ tablet Take 1 tablet (10 mEq total) by mouth daily. 02/17/19  Yes [name], MD  pravastatin  (PRAVACHOL) 40 MG tablet Take 1 tablet (40 mg total) by mouth daily. 02/17/19  Yes [name], [name], MD  Simethicone (GAS-X PO) Take 1 capsule by mouth as needed (gas/bloating.).    Yes [provider]  topiramate (TOPAMAX) 50 MG tablet Take 1 tablet (50 mg total) by mouth 2 (two) times daily. 05/12/19  Yes [name], [name], MD  albuterol (PROVENTIL HFA;VENTOLIN HFA) 108 (90 Base) MCG/ACT inhaler Inhale 2 puffs into the lungs every 4 (four) hours as needed for wheezing or shortness of breath. 12/25/18   [name], MD  ALPRAZolam Duanne Moron) 0.5 MG tablet 1/2 to 1 po BID prn anxiety 08/21/17   [name], MD  blood glucose meter kit and supplies KIT Tests once a day  E11.9 04/30/19   [name], [name], MD  fluticasone (FLONASE) 50 MCG/ACT nasal spray PLACE 2 SPRAYS INTO THE NOSE AS NEEDED. Patient taking differently: Place 2 sprays into both nostrils daily as needed (allergies.).  02/18/15   [name], MD  Insulin Pen Needle 32G X 4 MM MISC Use with Victoza as directed 12/11/17   [name], MD    Allergies as of 04/29/2019 - Review Complete 04/17/2019  Allergen Reaction Noted  . Ivp dye [iodinated diagnostic agents]  06/04/2013  . Zoloft [sertraline hcl]  05/21/2017    Family History  Problem Relation Age of Onset  . Cardiomyopathy Father   . Heart failure Mother   . Hyperlipidemia Mother   . Stroke Brother   . Kidney cancer Brother   . Diabetes Paternal Uncle   . Heart disease Paternal Grandmother   . Colon cancer Neg Hx   . Colon polyps Neg Hx     Social History   Socioeconomic History  . Marital status: Divorced    Spouse name: Not on file  . Number of children: 2  . Years of education: Not on file  . Highest education level: Not on file  Occupational History  . Occupation: retired    Comment: 2014 or so  Social Needs  . Financial resource strain: Not on file  . Food insecurity    Worry: Not on file    Inability: Not on file  . Transportation needs     Medical: Not on file    Non-medical: Not on file  Tobacco Use  . Smoking status: Current Every Day Smoker    Packs/day: 0.25    Years: 30.00    Pack years: 7.50    Types: Cigarettes    Last attempt to quit: 10/16/2010    Years since quitting: 8.6  . Smokeless tobacco: Former Systems developer    Quit date: 10/28/2010  Substance and Sexual Activity  . Alcohol use: Yes    Comment: socially, maybe 3 times a year   . Drug use: No  . Sexual activity:  Not on file  Lifestyle  . Physical activity    Days per week: Not on file    Minutes per session: Not on file  . Stress: Not on file  Relationships  . Social Herbalist on phone: Not on file    Gets together: Not on file    Attends religious service: Not on file    Active member of club or organization: Not on file    Attends meetings of clubs or organizations: Not on file    Relationship status: Not on file  . Intimate partner violence    Fear of current or ex partner: Not on file    Emotionally abused: Not on file    Physically abused: Not on file    Forced sexual activity: Not on file  Other Topics Concern  . Not on file  Social History Narrative  . Not on file    Review of Systems: See HPI, otherwise negative ROS   Physical Exam: BP (!) 132/94   Pulse 83   Temp 98.2 F (36.8 C) (Oral)   Resp 17   Ht 5' 2" (1.575 m)   Wt 69.9 kg   SpO2 100%   BMI 28.17 kg/m  General:   Alert,  pleasant and cooperative in NAD Head:  Normocephalic and atraumatic. Neck:  Supple; Lungs:  Clear throughout to auscultation.    Heart:  Regular rate and rhythm. Abdomen:  Soft, nontender and nondistended. Normal bowel sounds, without guarding, and without rebound.   Neurologic:  Alert and  oriented x4;  grossly normal neurologically.  Impression/Plan:    IRON DEFICIENCY ANEMIA.  Plan: 1. TCS today. DISCUSSED PROCEDURE, BENEFITS, & RISKS: < 1% chance of medication reaction, bleeding, perforation, ASPIRATION, or rupture of spleen/liver  requiring surgery to fix it and missed polyps < 1 cm 10-20% of the time.

## 2019-06-09 NOTE — Telephone Encounter (Signed)
PATIENT SCHEDULED  °

## 2019-06-11 NOTE — Progress Notes (Signed)
CC'D TO PCP AND ON RECALL  °

## 2019-06-11 NOTE — Progress Notes (Signed)
Pt is aware of results and plan.

## 2019-06-12 ENCOUNTER — Ambulatory Visit: Payer: PPO | Admitting: Family Medicine

## 2019-06-12 ENCOUNTER — Encounter (HOSPITAL_COMMUNITY): Payer: Self-pay | Admitting: Gastroenterology

## 2019-06-13 ENCOUNTER — Ambulatory Visit: Payer: PPO | Admitting: Family Medicine

## 2019-06-13 DIAGNOSIS — R29898 Other symptoms and signs involving the musculoskeletal system: Secondary | ICD-10-CM | POA: Diagnosis not present

## 2019-06-18 DIAGNOSIS — R29898 Other symptoms and signs involving the musculoskeletal system: Secondary | ICD-10-CM | POA: Diagnosis not present

## 2019-06-25 DIAGNOSIS — R29898 Other symptoms and signs involving the musculoskeletal system: Secondary | ICD-10-CM | POA: Diagnosis not present

## 2019-06-26 DIAGNOSIS — H2512 Age-related nuclear cataract, left eye: Secondary | ICD-10-CM | POA: Diagnosis not present

## 2019-06-26 DIAGNOSIS — H2513 Age-related nuclear cataract, bilateral: Secondary | ICD-10-CM | POA: Diagnosis not present

## 2019-06-26 DIAGNOSIS — H25013 Cortical age-related cataract, bilateral: Secondary | ICD-10-CM | POA: Diagnosis not present

## 2019-06-26 DIAGNOSIS — E113293 Type 2 diabetes mellitus with mild nonproliferative diabetic retinopathy without macular edema, bilateral: Secondary | ICD-10-CM | POA: Diagnosis not present

## 2019-06-26 DIAGNOSIS — H35033 Hypertensive retinopathy, bilateral: Secondary | ICD-10-CM | POA: Diagnosis not present

## 2019-06-26 LAB — HM DIABETES EYE EXAM

## 2019-06-27 ENCOUNTER — Encounter: Payer: Self-pay | Admitting: Family Medicine

## 2019-06-30 DIAGNOSIS — Z6829 Body mass index (BMI) 29.0-29.9, adult: Secondary | ICD-10-CM | POA: Diagnosis not present

## 2019-06-30 DIAGNOSIS — M4722 Other spondylosis with radiculopathy, cervical region: Secondary | ICD-10-CM | POA: Diagnosis not present

## 2019-06-30 DIAGNOSIS — I10 Essential (primary) hypertension: Secondary | ICD-10-CM | POA: Diagnosis not present

## 2019-07-02 ENCOUNTER — Other Ambulatory Visit: Payer: Self-pay

## 2019-07-02 ENCOUNTER — Ambulatory Visit (INDEPENDENT_AMBULATORY_CARE_PROVIDER_SITE_OTHER): Payer: PPO | Admitting: Family Medicine

## 2019-07-02 VITALS — BP 122/78 | Temp 98.5°F | Ht 62.0 in | Wt 160.0 lb

## 2019-07-02 DIAGNOSIS — R51 Headache: Secondary | ICD-10-CM | POA: Diagnosis not present

## 2019-07-02 DIAGNOSIS — Z23 Encounter for immunization: Secondary | ICD-10-CM | POA: Diagnosis not present

## 2019-07-02 DIAGNOSIS — E119 Type 2 diabetes mellitus without complications: Secondary | ICD-10-CM

## 2019-07-02 DIAGNOSIS — R519 Headache, unspecified: Secondary | ICD-10-CM

## 2019-07-02 DIAGNOSIS — I10 Essential (primary) hypertension: Secondary | ICD-10-CM | POA: Diagnosis not present

## 2019-07-02 DIAGNOSIS — E039 Hypothyroidism, unspecified: Secondary | ICD-10-CM

## 2019-07-02 DIAGNOSIS — E785 Hyperlipidemia, unspecified: Secondary | ICD-10-CM | POA: Diagnosis not present

## 2019-07-02 DIAGNOSIS — M109 Gout, unspecified: Secondary | ICD-10-CM

## 2019-07-02 DIAGNOSIS — R29898 Other symptoms and signs involving the musculoskeletal system: Secondary | ICD-10-CM

## 2019-07-02 LAB — POCT GLYCOSYLATED HEMOGLOBIN (HGB A1C): Hemoglobin A1C: 5.6 % (ref 4.0–5.6)

## 2019-07-02 MED ORDER — ALLOPURINOL 100 MG PO TABS: ORAL_TABLET | ORAL | 1 refills | Status: DC

## 2019-07-02 MED ORDER — ZOSTER VAC RECOMB ADJUVANTED 50 MCG/0.5ML IM SUSR: 0.5000 mL | Freq: Once | INTRAMUSCULAR | 0 refills | Status: AC

## 2019-07-02 NOTE — Progress Notes (Signed)
Subjective:    Patient ID: Joanna [name], female    DOB: Sep 28, 1951, 68 y.o.   MRN: [id number]  Diabetes She presents for her follow-up diabetic visit. She has type 2 diabetes mellitus. Pertinent negatives for hypoglycemia include no confusion or dizziness. Pertinent negatives for diabetes include no chest pain, no fatigue, no polydipsia, no polyphagia and no weakness. Current diabetic treatments: victoza. She is compliant with treatment all of the time. Home blood sugar record trend: 108 -140. Eye exam is current (sept).   Still having problems sleeping. Takes lorazepam for sleep.  She does take the medicine it does help to some degree.  Still not able to use left hand.  This is been going on for several months and started off back in February MRI showed significant problems she was referred to neurosurgery they did do surgery she has not had any recovery of her hand she states at times it is a little bit tremorous in addition to this she gets frequent headaches and wakes up with headaches in the morning.  No nausea or vomiting or double vision with it.  Patient has thyroid condition.  Takes thyroid medication on a regular basis.  States that the proper way.  Relates compliance.  States no negative side effects.  States condition seems to be under good control.  Patient here for follow-up regarding cholesterol.  The patient does have hyperlipidemia.  Patient does try to maintain a reasonable diet.  Patient does take the medication on a regular basis.  Denies missing a dose.  The patient denies any obvious side effects.  Prior blood work results reviewed with the patient.  The patient is aware of his cholesterol goals and the need to keep it under good control to lessen the risk of disease.  Patient for blood pressure check up.  The patient does have hypertension.  The patient is on medication.  Patient relates compliance with meds. Todays BP reviewed with the patient. Patient denies issues with  medication. Patient relates reasonable diet. Patient tries to minimize salt. Patient aware of BP goals.  Needs rx for shingles vaccine #2 and wants flu vaccine.   Results for orders placed or performed in visit on 07/02/19  POCT glycosylated hemoglobin (Hb A1C)  Result Value Ref Range   Hemoglobin A1C 5.6 4.0 - 5.6 %   HbA1c POC (<> result, manual entry)     HbA1c, POC (prediabetic range)     HbA1c, POC (controlled diabetic range)       Results for orders placed or performed in visit on 06/27/19  HM DIABETES EYE EXAM  Result Value Ref Range   HM Diabetic Eye Exam Retinopathy (A) No Retinopathy    Review of Systems  Constitutional: Negative for activity change, appetite change and fatigue.  HENT: Negative for congestion and rhinorrhea.   Respiratory: Negative for cough and shortness of breath.   Cardiovascular: Negative for chest pain and leg swelling.  Gastrointestinal: Negative for abdominal pain and diarrhea.  Endocrine: Negative for polydipsia and polyphagia.  Skin: Negative for color change.  Neurological: Negative for dizziness and weakness.  Psychiatric/Behavioral: Negative for behavioral problems and confusion.       Objective:   Physical Exam Vitals signs reviewed.  Constitutional:      General: She is not in acute distress. HENT:     Head: Normocephalic and atraumatic.  Eyes:     General:        Right eye: No discharge.  Left eye: No discharge.  Neck:     Trachea: No tracheal deviation.  Cardiovascular:     Rate and Rhythm: Normal rate and regular rhythm.     Heart sounds: Normal heart sounds. No murmur.  Pulmonary:     Effort: Pulmonary effort is normal. No respiratory distress.     Breath sounds: Normal breath sounds.  Lymphadenopathy:     Cervical: No cervical adenopathy.  Skin:    General: Skin is warm and dry.  Neurological:     Mental Status: She is alert.     Coordination: Coordination normal.  Psychiatric:        Behavior: Behavior  normal.    She has obvious weakness of the intrinsic muscles of the left hand.  It is necessary for her to get further evaluation for possibility of a degenerative nerve condition versus nerve damage from her abnormal MRI       Assessment & Plan:  1. Diabetes mellitus without complication ([place]) Diabetes good control overall doing a good job watching starches in diet - POCT glycosylated hemoglobin (Hb A1C)  2. Left hand weakness I am concerned about the left hand weakness this is been going on since earlier this year at first her MRI was abnormal she was seen by Dr. Christella Noa they get surgery earlier this year she has not had any recovery in her left hand this raises into question degenerative neurologic disease versus nerve damage we will refer to neurology for EMG testing and consultation - Ambulatory referral to Neurology  3. Frequent headaches She is also having frequent headaches wakes up in the morning time with headaches we will also refer her to neurology for further evaluation of this - Ambulatory referral to Neurology  4. Hyperlipidemia, unspecified hyperlipidemia type Hyperlipidemia check lipid profile previous labs reviewed continue medication - Lipid panel  5. Hypothyroidism, unspecified type Hypothyroidism doing well with medicine previous labs reviewed new labs ordered - TSH  6. Gout, unspecified cause, unspecified chronicity, unspecified site  Has had intermittent flareups of gout taking medication check uric acid level - Uric acid  7. Essential hypertension, benign Blood pressure issues check metabolic 7 to look at kidney function takes her medicine watches salt blood pressure good control - Basic metabolic panel  8. Need for vaccination Flu shot today - Flu Vaccine QUAD 6+ mos PF IM (Fluarix Quad PF) 25 minutes was spent with the patient.  This statement verifies that 25 minutes was indeed spent with the patient.  More than 50% of this visit-total duration of  the visit-was spent in counseling and coordination of care. The issues that the patient came in for today as reflected in the diagnosis (s) please refer to documentation for further details.  25 minutes was spent with the patient.  This statement verifies that 25 minutes was indeed spent with the patient.  More than 50% of this visit-total duration of the visit-was spent in counseling and coordination of care. The issues that the patient came in for today as reflected in the diagnosis (s) please refer to documentation for further details.

## 2019-07-04 DIAGNOSIS — G4733 Obstructive sleep apnea (adult) (pediatric): Secondary | ICD-10-CM | POA: Diagnosis not present

## 2019-07-07 ENCOUNTER — Ambulatory Visit: Payer: PPO | Admitting: Gastroenterology

## 2019-07-08 ENCOUNTER — Encounter: Payer: Self-pay | Admitting: Family Medicine

## 2019-07-09 ENCOUNTER — Encounter: Payer: Self-pay | Admitting: Family Medicine

## 2019-07-14 ENCOUNTER — Other Ambulatory Visit: Payer: Self-pay | Admitting: *Deleted

## 2019-07-14 MED ORDER — OMEPRAZOLE 40 MG PO CPDR: 40.0000 mg | DELAYED_RELEASE_CAPSULE | Freq: Every day | ORAL | 0 refills | Status: DC

## 2019-07-15 ENCOUNTER — Other Ambulatory Visit: Payer: Self-pay | Admitting: *Deleted

## 2019-07-15 MED ORDER — PRAVASTATIN SODIUM 40 MG PO TABS: 40.0000 mg | ORAL_TABLET | Freq: Every day | ORAL | 1 refills | Status: DC

## 2019-07-17 ENCOUNTER — Other Ambulatory Visit: Payer: Self-pay

## 2019-07-17 ENCOUNTER — Inpatient Hospital Stay (HOSPITAL_COMMUNITY): Payer: PPO | Attending: Hematology

## 2019-07-17 ENCOUNTER — Other Ambulatory Visit (HOSPITAL_COMMUNITY): Payer: Self-pay | Admitting: Nurse Practitioner

## 2019-07-17 DIAGNOSIS — Z79899 Other long term (current) drug therapy: Secondary | ICD-10-CM | POA: Diagnosis not present

## 2019-07-17 DIAGNOSIS — E039 Hypothyroidism, unspecified: Secondary | ICD-10-CM | POA: Diagnosis not present

## 2019-07-17 DIAGNOSIS — Z90710 Acquired absence of both cervix and uterus: Secondary | ICD-10-CM | POA: Insufficient documentation

## 2019-07-17 DIAGNOSIS — K5900 Constipation, unspecified: Secondary | ICD-10-CM | POA: Insufficient documentation

## 2019-07-17 DIAGNOSIS — Z794 Long term (current) use of insulin: Secondary | ICD-10-CM | POA: Diagnosis not present

## 2019-07-17 DIAGNOSIS — N189 Chronic kidney disease, unspecified: Secondary | ICD-10-CM | POA: Insufficient documentation

## 2019-07-17 DIAGNOSIS — F17210 Nicotine dependence, cigarettes, uncomplicated: Secondary | ICD-10-CM | POA: Insufficient documentation

## 2019-07-17 DIAGNOSIS — D509 Iron deficiency anemia, unspecified: Secondary | ICD-10-CM | POA: Insufficient documentation

## 2019-07-17 DIAGNOSIS — Z9079 Acquired absence of other genital organ(s): Secondary | ICD-10-CM | POA: Diagnosis not present

## 2019-07-17 DIAGNOSIS — I10 Essential (primary) hypertension: Secondary | ICD-10-CM | POA: Insufficient documentation

## 2019-07-17 DIAGNOSIS — E119 Type 2 diabetes mellitus without complications: Secondary | ICD-10-CM | POA: Insufficient documentation

## 2019-07-17 DIAGNOSIS — Z791 Long term (current) use of non-steroidal anti-inflammatories (NSAID): Secondary | ICD-10-CM | POA: Insufficient documentation

## 2019-07-17 DIAGNOSIS — E876 Hypokalemia: Secondary | ICD-10-CM | POA: Diagnosis not present

## 2019-07-17 DIAGNOSIS — Z90722 Acquired absence of ovaries, bilateral: Secondary | ICD-10-CM | POA: Insufficient documentation

## 2019-07-17 LAB — FOLATE: Folate: 6.9 ng/mL (ref >5.9)

## 2019-07-17 LAB — CBC WITH DIFFERENTIAL/PLATELET
Abs Immature Granulocytes: 0.02 10*3/uL (ref 0.00–0.07)
Basophils Absolute: 0 10*3/uL (ref 0.0–0.1)
Basophils Relative: 1 %
Eosinophils Absolute: 0.1 10*3/uL (ref 0.0–0.5)
Eosinophils Relative: 1 %
HCT: 38.1 % (ref 36.0–46.0)
Hemoglobin: 12 g/dL (ref 12.0–15.0)
Immature Granulocytes: 0 %
Lymphocytes Relative: 28 %
Lymphs Abs: 1.9 10*3/uL (ref 0.7–4.0)
MCH: 28.8 pg (ref 26.0–34.0)
MCHC: 31.5 g/dL (ref 30.0–36.0)
MCV: 91.4 fL (ref 80.0–100.0)
Monocytes Absolute: 0.4 10*3/uL (ref 0.1–1.0)
Monocytes Relative: 6 %
Neutro Abs: 4.3 10*3/uL (ref 1.7–7.7)
Neutrophils Relative %: 64 %
Platelets: 290 10*3/uL (ref 150–400)
RBC: 4.17 MIL/uL (ref 3.87–5.11)
RDW: 14.1 % (ref 11.5–15.5)
WBC: 6.7 10*3/uL (ref 4.0–10.5)
nRBC: 0 % (ref 0.0–0.2)

## 2019-07-17 LAB — COMPREHENSIVE METABOLIC PANEL
ALT: 9 U/L (ref 0–44)
AST: 14 U/L — ABNORMAL LOW (ref 15–41)
Albumin: 4 g/dL (ref 3.5–5.0)
Alkaline Phosphatase: 76 U/L (ref 38–126)
Anion gap: 10 (ref 5–15)
BUN: 24 mg/dL — ABNORMAL HIGH (ref 8–23)
CO2: 26 mmol/L (ref 22–32)
Calcium: 10.5 mg/dL — ABNORMAL HIGH (ref 8.9–10.3)
Chloride: 108 mmol/L (ref 98–111)
Creatinine, Ser: 1.53 mg/dL — ABNORMAL HIGH (ref 0.44–1.00)
GFR calc Af Amer: 40 mL/min — ABNORMAL LOW (ref >60)
GFR calc non Af Amer: 35 mL/min — ABNORMAL LOW (ref >60)
Glucose, Bld: 98 mg/dL (ref 70–99)
Potassium: 3.2 mmol/L — ABNORMAL LOW (ref 3.5–5.1)
Sodium: 144 mmol/L (ref 135–145)
Total Bilirubin: 0.4 mg/dL (ref 0.3–1.2)
Total Protein: 7 g/dL (ref 6.5–8.1)

## 2019-07-17 LAB — VITAMIN D 25 HYDROXY (VIT D DEFICIENCY, FRACTURES): Vit D, 25-Hydroxy: 50.93 ng/mL (ref 30–100)

## 2019-07-17 LAB — IRON AND TIBC
Iron: 71 ug/dL (ref 28–170)
Saturation Ratios: 23 % (ref 10.4–31.8)
TIBC: 307 ug/dL (ref 250–450)
UIBC: 236 ug/dL

## 2019-07-17 LAB — VITAMIN B12: Vitamin B-12: 1089 pg/mL — ABNORMAL HIGH (ref 180–914)

## 2019-07-17 LAB — LACTATE DEHYDROGENASE: LDH: 148 U/L (ref 98–192)

## 2019-07-17 LAB — FERRITIN: Ferritin: 144 ng/mL (ref 11–307)

## 2019-07-17 NOTE — Progress Notes (Signed)
Patient was called on 07/17/2019 and given her results for her potassium at 3.2. She reported she did not take her potassium the last few days. She was told to take 2 pills today and two tomorrow then go back on once daily.

## 2019-07-21 DIAGNOSIS — E785 Hyperlipidemia, unspecified: Secondary | ICD-10-CM | POA: Diagnosis not present

## 2019-07-21 DIAGNOSIS — E039 Hypothyroidism, unspecified: Secondary | ICD-10-CM | POA: Diagnosis not present

## 2019-07-21 DIAGNOSIS — I10 Essential (primary) hypertension: Secondary | ICD-10-CM | POA: Diagnosis not present

## 2019-07-21 DIAGNOSIS — M109 Gout, unspecified: Secondary | ICD-10-CM | POA: Diagnosis not present

## 2019-07-22 LAB — BASIC METABOLIC PANEL
BUN/Creatinine Ratio: 14 (ref 12–28)
BUN: 20 mg/dL (ref 8–27)
CO2: 24 mmol/L (ref 20–29)
Calcium: 10.5 mg/dL — ABNORMAL HIGH (ref 8.7–10.3)
Chloride: 107 mmol/L — ABNORMAL HIGH (ref 96–106)
Creatinine, Ser: 1.48 mg/dL — ABNORMAL HIGH (ref 0.57–1.00)
GFR calc Af Amer: 42 mL/min/{1.73_m2} — ABNORMAL LOW (ref >59)
GFR calc non Af Amer: 36 mL/min/{1.73_m2} — ABNORMAL LOW (ref >59)
Glucose: 122 mg/dL — ABNORMAL HIGH (ref 65–99)
Potassium: 3.3 mmol/L — ABNORMAL LOW (ref 3.5–5.2)
Sodium: 146 mmol/L — ABNORMAL HIGH (ref 134–144)

## 2019-07-22 LAB — LIPID PANEL
Chol/HDL Ratio: 2.4 ratio (ref 0.0–4.4)
Cholesterol, Total: 171 mg/dL (ref 100–199)
HDL: 72 mg/dL (ref >39)
LDL Chol Calc (NIH): 81 mg/dL (ref 0–99)
Triglycerides: 99 mg/dL (ref 0–149)
VLDL Cholesterol Cal: 18 mg/dL (ref 5–40)

## 2019-07-22 LAB — TSH: TSH: 0.79 u[IU]/mL (ref 0.450–4.500)

## 2019-07-22 LAB — URIC ACID: Uric Acid: 7.7 mg/dL — ABNORMAL HIGH (ref 2.5–7.1)

## 2019-07-24 ENCOUNTER — Inpatient Hospital Stay (HOSPITAL_BASED_OUTPATIENT_CLINIC_OR_DEPARTMENT_OTHER): Payer: PPO | Admitting: Nurse Practitioner

## 2019-07-24 ENCOUNTER — Other Ambulatory Visit: Payer: Self-pay

## 2019-07-24 ENCOUNTER — Encounter (HOSPITAL_COMMUNITY): Payer: Self-pay | Admitting: Nurse Practitioner

## 2019-07-24 VITALS — BP 133/86 | HR 86 | Temp 97.5°F | Resp 20 | Wt 161.6 lb

## 2019-07-24 DIAGNOSIS — D509 Iron deficiency anemia, unspecified: Secondary | ICD-10-CM | POA: Diagnosis not present

## 2019-07-24 DIAGNOSIS — R918 Other nonspecific abnormal finding of lung field: Secondary | ICD-10-CM

## 2019-07-24 DIAGNOSIS — R634 Abnormal weight loss: Secondary | ICD-10-CM

## 2019-07-24 NOTE — Patient Instructions (Signed)
[facility] Discharge Instructions     Thank you for choosing [facility] to provide your oncology and hematology care.  To afford each patient quality time with our provider, please arrive at least 15 minutes before your scheduled appointment time.   If you have a lab appointment with the Cancer Center please come in thru the Main Entrance and check in at the main information desk.  You need to re-schedule your appointment should you arrive 10 or more minutes late.  We strive to give you quality time with our providers, and arriving late affects you and other patients whose appointments are after yours.  Also, if you no show three or more times for appointments you may be dismissed from the clinic at the providers discretion.     Again, thank you for choosing [facility].  Our hope is that these requests will decrease the amount of time that you wait before being seen by our physicians.       _____________________________________________________________  Should you have questions after your visit to [facility], please contact our office at [phone] between the hours of 8:00 a.m. and 4:30 p.m.  Voicemails left after 4:00 p.m. will not be returned until the following business day.  For prescription refill requests, have your pharmacy contact our office and allow 72 hours.    Due to Covid, you will need to wear a mask upon entering the hospital. If you do not have a mask, a mask will be given to you at the Main Entrance upon arrival. For doctor visits, patients may have 1 support person with them. For treatment visits, patients can not have anyone with them due to social distancing guidelines and our immunocompromised population.      

## 2019-07-24 NOTE — Assessment & Plan Note (Addendum)
1.  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 -Her last Feraheme infusion was on 12/11/2018 and 12/18/2018. -Patient reports ice pica.  She denies any bleeding per rectum or melena. -She cannot tolerate oral iron because of constipation. -We talked about Procrit injections on her last visit. -Labs today on 07/17/2019 showed her hemoglobin 12.9, ferritin 144, percent saturation 23 - She will follow-up in 3 months with repeat labs.  2.  Hypokalemia: - Patient reports she has had problems with her potassium being low for months.  She is being followed by her PCP for this. -Her PCP recently prescribed her 20 mEq of potassium to take daily.  However she has not been taking this as prescribed. -Labs on 07/21/2019 showed her potassium 3.3.  She reports she did not take her potassium at the day of her labs.  However she has been taking it as normal since.  3.  Smoking history: -She is a current active smoker, smokes half a pack per day for 50 years. -A low-dose CT scan was ordered but was declined due to insurance purposes.  4.  Health maintenance: -Mammogram was on 03/17/2019 was B RADS category 1 - Her last colonoscopy was 06/06/2019 which showed the terminal ileum appeared normal.  A 6 mm polyp was found in the rectum.  The polyp was sessile.  The polyp was removed with a hot snare.  Multiple small and large mouth diverticula were found in the entire colon.  External and internal hemorrhoids were found.  The hemorrhoids were moderate.  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  Follow-up colonoscopy in 10 years  5.  Unexplained weight loss: - Patient has lost 47 pounds in less than a year. -She reports she is not have an appetite. -We ordered a CT scan of the CAP with oral contrast on 01/08/2019 which showed a solitary right lower lobe solid 4 mm pulmonary nodule, mildly increased in size from 2 mm in 2010.  Likely benign nodule due to the slow  growth. - We will follow-up with a chest CT in 6 to 12 months from last scan.  We will schedule this for her next visit.  6.  Chronic kidney disease: - Her creatinine has been elevated since 2014. - Labs done on 07/21/2019 showed her creatinine 1.48

## 2019-07-24 NOTE — Progress Notes (Signed)
Joanna [name],  [id number]   CLINIC:  Medical Oncology/Hematology  PCP:  [name], MD [address] [place] [address] [phone]   REASON FOR VISIT: Follow-up for microcytic anemia  CURRENT THERAPY: Intermittent iron infusions   INTERVAL HISTORY:  [name] 68 y.o. female returns for routine follow-up for microcytic anemia.  She reports she has been doing well since her last visit.  She had her mammogram and her colonoscopy completed.  She does have occasional constipation and headaches.  He denies any bright red bleeding per rectum or melena. Denies any nausea, vomiting, or diarrhea. Denies any new pains. Had not noticed any recent bleeding such as epistaxis, hematuria or hematochezia. Denies recent chest pain on exertion, shortness of breath on minimal exertion, pre-syncopal episodes, or palpitations. Denies any numbness or tingling in hands or feet. Denies any recent fevers, infections, or recent hospitalizations. Patient reports appetite at 25% and energy level at 25%.  She is eating well maintaining her weight at this time.    REVIEW OF SYSTEMS:  Review of Systems  Gastrointestinal: Positive for constipation.  Neurological: Positive for headaches.  Psychiatric/Behavioral: Positive for sleep disturbance.  All other systems reviewed and are negative.    PAST MEDICAL/SURGICAL HISTORY:  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 [facility]   Depression    Diabetes mellitus    Diverticulosis    Dyslipidemia    GERD (gastroesophageal reflux disease)    Hypertension    Insomnia    Microproteinuria    Renal insufficiency 2010   Right carpal tunnel syndrome 12/24/2017   Sleep apnea    Sleep apnea 12/25/2017   Abnormal sleep study February 2019 CPAP ordered   Thyroid disease    hypothyroid   Past Surgical History:  Procedure Laterality  Date   ABDOMINAL HYSTERECTOMY  [place] [facility]  01/16/2019   COLONOSCOPY  01/29/12   Fields-2-3 hyperplastic polyps, mod internal hemorrhoids, diverticulosis throughout colon   COLONOSCOPY N/A 07/03/2014   Dr. Oneida Alar: Hyperplastic polyps   COLONOSCOPY N/A 06/06/2019   Procedure: COLONOSCOPY;  Surgeon: [name], MD;  Location: [facility];  Service: Endoscopy;  Laterality: N/A;  9:15AM   ESOPHAGOGASTRODUODENOSCOPY   01/29/2012   Fields-2-3cm sliding HH, fundic gland polyps, NEGATIVE H pylori   ESOPHAGOGASTRODUODENOSCOPY N/A 07/03/2014   Dr. Oneida Alar: gastritis and normal small bowel biopsies   [facility] CAPSULE STUDY N/A 10/26/2014   SMALL BOWEL AVMs   KNEE ARTHROSCOPY Right    POLYPECTOMY  06/06/2019   Procedure: POLYPECTOMY;  Surgeon: [name], MD;  Location: [facility];  Service: Endoscopy;;   TOTAL ABDOMINAL HYSTERECTOMY W/ BILATERAL SALPINGOOPHORECTOMY       SOCIAL HISTORY:  Social History   Socioeconomic History   Marital status: Divorced    Spouse name: Not on file   Number of children: 2   Years of education: Not on file   Highest education level: Not on file  Occupational History   Occupation: retired    Comment: 2014 or so  Social Designer, fashion/clothing strain: Not on file   Food insecurity    Worry: Not on file    Inability: Not on file   Transportation needs    Medical: Not on file    Non-medical: Not on file  Tobacco  Use   Smoking status: Current Every Day Smoker    Packs/day: 0.25    Years: 30.00    Pack years: 7.50    Types: Cigarettes    Last attempt to quit: 10/16/2010    Years since quitting: 8.7   Smokeless tobacco: Former Systems developer    Quit date: 10/28/2010  Substance and Sexual Activity   Alcohol use: Yes    Comment: socially, maybe 3 times a year    Drug use: No   Sexual activity: Not on file  Lifestyle   Physical activity    Days per week: Not on  file    Minutes per session: Not on file   Stress: Not on file  Relationships   Social connections    Talks on phone: Not on file    Gets together: Not on file    Attends religious service: Not on file    Active member of club or organization: Not on file    Attends meetings of clubs or organizations: Not on file    Relationship status: Not on file   Intimate partner violence    Fear of current or ex partner: Not on file    Emotionally abused: Not on file    Physically abused: Not on file    Forced sexual activity: Not on file  Other Topics Concern   Not on file  Social History Narrative   Not on file    FAMILY HISTORY:  Family History  Problem Relation Age of Onset   Cardiomyopathy Father    Heart failure Mother    Hyperlipidemia Mother    Stroke Brother    Kidney cancer Brother    Diabetes Paternal Uncle    Heart disease Paternal Grandmother    Colon cancer Neg Hx    Colon polyps Neg Hx     CURRENT MEDICATIONS:  Outpatient Encounter Medications as of 07/24/2019  Medication Sig Note   allopurinol (ZYLOPRIM) 100 MG tablet Take one tablet by mouth daily    amLODipine (NORVASC) 5 MG tablet Take 1 tablet (5 mg total) by mouth daily.    blood glucose meter kit and supplies KIT Tests once a day  E11.9    Cyanocobalamin (VITAMIN B-12 PO) Take 1 tablet by mouth daily.    ferrous sulfate 325 (65 FE) MG tablet Take 325 mg by mouth daily with breakfast.    fexofenadine (ALLEGRA) 180 MG tablet Take 180 mg by mouth daily.    indapamide (LOZOL) 1.25 MG tablet TAKE 1 TABLET BY MOUTH EVERY MORNING (Patient taking differently: Take 1.25 mg by mouth daily. )    Insulin Pen Needle 32G X 4 MM MISC Use with Victoza as directed    levothyroxine (SYNTHROID) 88 MCG tablet Take 1/2 tablet by mouth on Mondays and fridays and 1 tablet on all other days (Patient taking differently: Take 44-88 mcg by mouth See admin instructions. Take 1/2 tablet by mouth on Mondays  and 1  tablet on all other days)    liraglutide (VICTOZA) 18 MG/3ML SOPN Inject 1.70m into the skin daily (Patient taking differently: Inject 1.2 mg into the skin at bedtime. )    losartan (COZAAR) 100 MG tablet TAKE 1 TABLET BY MOUTH EVERY DAY    Magnesium 250 MG TABS Take 250 mg by mouth daily.     omeprazole (PRILOSEC) 40 MG capsule Take 1 capsule (40 mg total) by mouth daily.    ONETOUCH VERIO test strip TEST ONCE DAILY    potassium chloride (K-DUR)  10 MEQ tablet Take 1 tablet (10 mEq total) by mouth daily.    pravastatin (PRAVACHOL) 40 MG tablet Take 1 tablet (40 mg total) by mouth daily.    topiramate (TOPAMAX) 50 MG tablet Take 1 tablet (50 mg total) by mouth 2 (two) times daily.    albuterol (PROVENTIL HFA;VENTOLIN HFA) 108 (90 Base) MCG/ACT inhaler Inhale 2 puffs into the lungs every 4 (four) hours as needed for wheezing or shortness of breath. (Patient not taking: Reported on 07/24/2019)    brimonidine (ALPHAGAN) 0.2 % ophthalmic solution INSTILL 1 DROP INTO THE LEFT EYE BID STARTING 48 HOURS PRIOR TO SURGERY    colchicine 0.6 MG tablet 1 bid prn gout (Patient not taking: Reported on 07/24/2019)    fluticasone (FLONASE) 50 MCG/ACT nasal spray PLACE 2 SPRAYS INTO THE NOSE AS NEEDED. (Patient not taking: No sig reported) 05/05/2016: otc   ketorolac (ACULAR) 0.5 % ophthalmic solution INSTILL 1 DROP INTO THE LEFT EYE QID STARTING 1 WEEK PRIOR TO SURGERY    LORazepam (ATIVAN) 0.5 MG tablet Take one tab by mouth each night as needed (Patient not taking: Reported on 07/24/2019)    ofloxacin (OCUFLOX) 0.3 % ophthalmic solution INSTILL 1 DROP INTO BOTH EYES 4 TIMES DAILY STARTING 72 HOURS PRIOR TO SURGERY    prednisoLONE acetate (PRED FORTE) 1 % ophthalmic suspension INSTILL ONE GTT IN LEFT EYE QID-USE ONLY AFTER SURGERY    Simethicone (GAS-X PO) Take 1 capsule by mouth as needed (gas/bloating.).     No facility-administered encounter medications on file as of 07/24/2019.     ALLERGIES:    Allergies  Allergen Reactions   Ivp Dye [Iodinated Diagnostic Agents]    Zoloft [Sertraline Hcl]     Withdrawn,bad dreams     PHYSICAL EXAM:  ECOG Performance status: 1  Vitals:   07/24/19 1034  BP: 133/86  Pulse: 86  Resp: 20  Temp: (!) 97.5 F (36.4 C)  SpO2: 100%   Filed Weights   07/24/19 1034  Weight: 161 lb 9.6 oz (73.3 kg)    Physical Exam Constitutional:      Appearance: Normal appearance. She is normal weight.  Cardiovascular:     Rate and Rhythm: Normal rate and regular rhythm.     Heart sounds: Normal heart sounds.  Pulmonary:     Effort: Pulmonary effort is normal.     Breath sounds: Normal breath sounds.  Abdominal:     General: Bowel sounds are normal.     Palpations: Abdomen is soft.  Musculoskeletal: Normal range of motion.  Skin:    General: Skin is warm.  Neurological:     Mental Status: She is alert and oriented to person, place, and time. Mental status is at baseline.  Psychiatric:        Mood and Affect: Mood normal.        Behavior: Behavior normal.        Thought Content: Thought content normal.        Judgment: Judgment normal.      LABORATORY DATA:  I have reviewed the labs as listed.  CBC    Component Value Date/Time   WBC 6.7 07/17/2019 1031   RBC 4.17 07/17/2019 1031   HGB 12.0 07/17/2019 1031   HGB 10.1 (L) 11/13/2018 1205   HCT 38.1 07/17/2019 1031   HCT 32.5 (L) 11/13/2018 1205   PLT 290 07/17/2019 1031   PLT 366 11/13/2018 1205   MCV 91.4 07/17/2019 1031   MCV 80 11/13/2018 1205  MCH 28.8 07/17/2019 1031   MCHC 31.5 07/17/2019 1031   RDW 14.1 07/17/2019 1031   RDW 16.5 (H) 11/13/2018 1205   LYMPHSABS 1.9 07/17/2019 1031   LYMPHSABS 2.4 11/13/2018 1205   MONOABS 0.4 07/17/2019 1031   EOSABS 0.1 07/17/2019 1031   EOSABS 0.1 11/13/2018 1205   BASOSABS 0.0 07/17/2019 1031   BASOSABS 0.1 11/13/2018 1205   CMP Latest Ref Rng & Units 07/21/2019 07/17/2019 05/07/2019  Glucose 65 - 99 mg/dL 122(H) 98 77  BUN 8 -  27 mg/dL 20 24(H) 25  Creatinine 0.57 - 1.00 mg/dL 1.48(H) 1.53(H) 1.65(H)  Sodium 134 - 144 mmol/L 146(H) 144 146(H)  Potassium 3.5 - 5.2 mmol/L 3.3(L) 3.2(L) 3.8  Chloride 96 - 106 mmol/L 107(H) 108 106  CO2 20 - 29 mmol/L '24 26 23  ' Calcium 8.7 - 10.3 mg/dL 10.5(H) 10.5(H) 10.3  Total Protein 6.5 - 8.1 g/dL - 7.0 -  Total Bilirubin 0.3 - 1.2 mg/dL - 0.4 -  Alkaline Phos 38 - 126 U/L - 76 -  AST 15 - 41 U/L - 14(L) -  ALT 0 - 44 U/L - 9 -       DIAGNOSTIC IMAGING:  I have independently reviewed the mammogram scans and discussed with the patient.    I personally performed a face-to-face visit.  All questions were answered to patient's stated satisfaction. Encouraged patient to call with any new concerns or questions before his next visit to the cancer center and we can certain see him sooner, if needed.     ASSESSMENT & PLAN:   Microcytic anemia 1.  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 -Her last Feraheme infusion was on 12/11/2018 and 12/18/2018. -Patient reports ice pica.  She denies any bleeding per rectum or melena. -She cannot tolerate oral iron because of constipation. -We talked about Procrit injections on her last visit. -Labs today on 07/17/2019 showed her hemoglobin 12.9, ferritin 144, percent saturation 23 - She will follow-up in 3 months with repeat labs.  2.  Hypokalemia: - Patient reports she has had problems with her potassium being low for months.  She is being followed by her PCP for this. -Her PCP recently prescribed her 20 mEq of potassium to take daily.  However she has not been taking this as prescribed. -Labs on 07/21/2019 showed her potassium 3.3.  She reports she did not take her potassium at the day of her labs.  However she has been taking it as normal since.  3.  Smoking history: -She is a current active smoker, smokes half a pack per day for 50 years. -A low-dose CT scan was  ordered but was declined due to insurance purposes.  4.  Health maintenance: -Mammogram was on 03/17/2019 was B RADS category 1 - Her last colonoscopy was 06/06/2019 which showed the terminal ileum appeared normal.  A 6 mm polyp was found in the rectum.  The polyp was sessile.  The polyp was removed with a hot snare.  Multiple small and large mouth diverticula were found in the entire colon.  External and internal hemorrhoids were found.  The hemorrhoids were moderate.  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  Follow-up colonoscopy in 10 years  5.  Unexplained weight loss: - Patient has lost 47 pounds in less than a year. -She reports she is not have an appetite. -We ordered a CT scan of the CAP with oral contrast on 01/08/2019 which showed a  solitary right lower lobe solid 4 mm pulmonary nodule, mildly increased in size from 2 mm in 2010.  Likely benign nodule due to the slow growth. - We will follow-up with a chest CT in 6 to 12 months from last scan.  We will schedule this for her next visit.  6.  Chronic kidney disease: - Her creatinine has been elevated since 2014. - Labs done on 07/21/2019 showed her creatinine 1.48      Orders placed this encounter:  Orders Placed This Encounter  Procedures   CT Chest Wo Contrast   CT Abdomen Pelvis Wo Contrast   Lactate dehydrogenase   CBC with Differential/Platelet   Comprehensive metabolic panel   Ferritin   Iron and TIBC   Vitamin B12   VITAMIN D 25 Hydroxy (Vit-D Deficiency, Fractures)   Folate      Francene Finders, FNP-C [place] [phone]

## 2019-07-25 ENCOUNTER — Other Ambulatory Visit: Payer: Self-pay | Admitting: Family Medicine

## 2019-07-29 MED ORDER — POTASSIUM CHLORIDE ER 10 MEQ PO TBCR: EXTENDED_RELEASE_TABLET | ORAL | 1 refills | Status: DC

## 2019-07-29 NOTE — Addendum Note (Signed)
Addended by: [name] on: 07/29/2019 04:36 PM   Modules accepted: Orders

## 2019-07-30 ENCOUNTER — Telehealth: Payer: Self-pay | Admitting: Family Medicine

## 2019-07-30 NOTE — Telephone Encounter (Signed)
Pt called to request appointment for possible UTI, schedule is booked for today & tomorrow  Consulted with the nurse, UTI shouldn't wait until Friday recommend Urgent Care  Explained to pt that a UTI can get bad quickly that we recommend Urgent Care, pt wanted appointment Friday with Dr. Nicki Reaper - explained that Dr. Nicki Reaper is not in the office on Friday this week

## 2019-08-01 ENCOUNTER — Other Ambulatory Visit: Payer: Self-pay

## 2019-08-01 DIAGNOSIS — G4733 Obstructive sleep apnea (adult) (pediatric): Secondary | ICD-10-CM | POA: Diagnosis not present

## 2019-08-01 MED ORDER — POTASSIUM CHLORIDE ER 10 MEQ PO TBCR: EXTENDED_RELEASE_TABLET | ORAL | 1 refills | Status: DC

## 2019-08-01 MED ORDER — INDAPAMIDE 1.25 MG PO TABS: ORAL_TABLET | ORAL | 1 refills | Status: DC

## 2019-08-05 DIAGNOSIS — H2512 Age-related nuclear cataract, left eye: Secondary | ICD-10-CM | POA: Diagnosis not present

## 2019-08-05 DIAGNOSIS — H25013 Cortical age-related cataract, bilateral: Secondary | ICD-10-CM | POA: Diagnosis not present

## 2019-08-05 DIAGNOSIS — H2513 Age-related nuclear cataract, bilateral: Secondary | ICD-10-CM | POA: Diagnosis not present

## 2019-08-05 DIAGNOSIS — H25812 Combined forms of age-related cataract, left eye: Secondary | ICD-10-CM | POA: Diagnosis not present

## 2019-08-25 ENCOUNTER — Encounter: Payer: Self-pay | Admitting: Family Medicine

## 2019-08-25 DIAGNOSIS — H25011 Cortical age-related cataract, right eye: Secondary | ICD-10-CM | POA: Diagnosis not present

## 2019-08-25 DIAGNOSIS — H2511 Age-related nuclear cataract, right eye: Secondary | ICD-10-CM | POA: Diagnosis not present

## 2019-08-25 LAB — HM DIABETES EYE EXAM

## 2019-09-02 DIAGNOSIS — H25011 Cortical age-related cataract, right eye: Secondary | ICD-10-CM | POA: Diagnosis not present

## 2019-09-02 DIAGNOSIS — H25811 Combined forms of age-related cataract, right eye: Secondary | ICD-10-CM | POA: Diagnosis not present

## 2019-09-02 DIAGNOSIS — H2511 Age-related nuclear cataract, right eye: Secondary | ICD-10-CM | POA: Diagnosis not present

## 2019-09-08 ENCOUNTER — Ambulatory Visit: Payer: PPO | Admitting: Neurology

## 2019-09-22 ENCOUNTER — Ambulatory Visit (INDEPENDENT_AMBULATORY_CARE_PROVIDER_SITE_OTHER): Payer: PPO | Admitting: Neurology

## 2019-09-22 ENCOUNTER — Other Ambulatory Visit: Payer: Self-pay

## 2019-09-22 ENCOUNTER — Encounter: Payer: Self-pay | Admitting: Neurology

## 2019-09-22 DIAGNOSIS — G8114 Spastic hemiplegia affecting left nondominant side: Secondary | ICD-10-CM

## 2019-09-22 MED ORDER — ALPRAZOLAM 0.5 MG PO TABS: ORAL_TABLET | ORAL | 0 refills | Status: DC

## 2019-09-22 NOTE — Progress Notes (Signed)
Reason for visit: Left upper extremity spasticity  Referring physician: Dr. Cresenciano Genre is a 68 y.o. [name]  History of present illness:  Joanna [name] is a 68 year old right-handed black [name] with a history of a gradually progressive problem with numbness and clumsiness of the left upper extremity.  The patient underwent cervical spine surgery in April 2020 after she was noted to have moderate spinal stenosis at the C4-5 level but there was evidence of myelomalacia affecting the left hemicord.  The patient claims that since the surgery her symptoms involving the left arm have not improved but they have stabilized.  She has undergone physical therapy for a frozen shoulder and underwent occupational therapy for clumsiness of the left hand.  She believes that the therapy has not really helped her much.  The patient does have a longstanding history of headaches that have been present since she was in her 37s.  She has about 2 headaches a week, sometimes 3.  She believes that the use of Topamax has been beneficial, this comes through her primary care physician.  The patient is mainly concerned about her left arm function.  The patient reports no photophobia or phonophobia with the headache, she reports no nausea or vomiting.  The headaches may last 3 to 4 hours, occasionally all day long.  The patient denies any significant neck pain.  She denies issues with balance or difficulty controlling the bowels or the bladder.  She has not had any falls.  She reports no speech or swallowing problems or any changes in vision.  She is sent to this office for evaluation.  Past Medical History:  Diagnosis Date  . 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 Bilateral headaches   . Depression   . Diabetes mellitus   . Diverticulosis   . Dyslipidemia   . GERD (gastroesophageal reflux disease)   . Hypertension   . Insomnia   . Microproteinuria   . Renal insufficiency  2010  . Right carpal tunnel syndrome 12/24/2017  . Sleep apnea   . Sleep apnea 12/25/2017   Abnormal sleep study February 2019 CPAP ordered  . Thyroid disease    hypothyroid    Past Surgical History:  Procedure Laterality Date  . ABDOMINAL HYSTERECTOMY  1984  . BACK SURGERY    . BILATERAL OOPHORECTOMY    . CERVICAL DISC SURGERY  01/16/2019  . COLONOSCOPY  01/29/12   Fields-2-3 hyperplastic polyps, mod internal hemorrhoids, diverticulosis throughout colon  . COLONOSCOPY N/A 07/03/2014   Dr. Oneida Alar: Hyperplastic polyps  . COLONOSCOPY N/A 06/06/2019   Procedure: COLONOSCOPY;  Surgeon: [name], MD;  Location: [facility];  Service: Endoscopy;  Laterality: N/A;  9:15AM  . ESOPHAGOGASTRODUODENOSCOPY   01/29/2012   Fields-2-3cm sliding HH, fundic gland polyps, NEGATIVE H pylori  . ESOPHAGOGASTRODUODENOSCOPY N/A 07/03/2014   Dr. Oneida Alar: gastritis and normal small bowel biopsies  . Joanna [name] CAPSULE STUDY N/A 10/26/2014   SMALL BOWEL AVMs  . KNEE ARTHROSCOPY Right   . POLYPECTOMY  06/06/2019   Procedure: POLYPECTOMY;  Surgeon: [name], MD;  Location: [facility];  Service: Endoscopy;;  . TOTAL ABDOMINAL HYSTERECTOMY W/ BILATERAL SALPINGOOPHORECTOMY      Family History  Problem Relation Age of Onset  . Cardiomyopathy Father   . Heart failure Mother   . Hyperlipidemia Mother   . Stroke Brother   . Kidney cancer Brother   . Diabetes Paternal Uncle   . Heart  disease Paternal Grandmother   . Colon cancer Neg Hx   . Colon polyps Neg Hx     Social history:  reports that she has been smoking cigarettes. She has a 7.50 pack-year smoking history. She quit smokeless tobacco use about 8 years ago. She reports current alcohol use. She reports that she does not use drugs.  Medications:  Prior to Admission medications   Medication Sig Start Date End Date Taking? Authorizing Provider  albuterol (PROVENTIL HFA;VENTOLIN HFA) 108 (90 Base) MCG/ACT inhaler Inhale 2 puffs into the lungs  every 4 (four) hours as needed for wheezing or shortness of breath. 12/25/18  Yes [name], MD  allopurinol (ZYLOPRIM) 100 MG tablet Take one tablet by mouth daily 07/02/19  Yes [name], [name], MD  amLODipine (NORVASC) 5 MG tablet Take 1 tablet (5 mg total) by mouth daily. 02/17/19  Yes [name], MD  colchicine 0.6 MG tablet 1 bid prn gout 03/12/19  Yes [name], [name], MD  Cyanocobalamin (VITAMIN B-12 PO) Take 1 tablet by mouth daily.   Yes [provider]  ferrous sulfate 325 (65 FE) MG tablet Take 325 mg by mouth daily with breakfast.   Yes [provider]  fexofenadine (ALLEGRA) 180 MG tablet Take 180 mg by mouth daily.   Yes [provider]  fluticasone (FLONASE) 50 MCG/ACT nasal spray PLACE 2 SPRAYS INTO THE NOSE AS NEEDED. 02/18/15  Yes [name], MD  indapamide (LOZOL) 1.25 MG tablet TAKE 1 TABLET BY MOUTH EVERY MORNING 08/01/19  Yes [name], [name], MD  ketorolac (ACULAR) 0.5 % ophthalmic solution INSTILL 1 DROP INTO THE LEFT EYE QID STARTING 1 WEEK PRIOR TO SURGERY 06/26/19  Yes [provider]  Lancets (ONETOUCH DELICA PLUS [id number]) [place] TEST ONCE DAILY 07/25/19  Yes [name], MD  levothyroxine (SYNTHROID) 88 MCG tablet Take 1/2 tablet by mouth on Mondays and fridays and 1 tablet on all other days Patient taking differently: Take 44-88 mcg by mouth See admin instructions. Take 1/2 tablet by mouth on Mondays  and 1 tablet on all other days 05/12/19  Yes [name], [name], MD  liraglutide (VICTOZA) 18 MG/3ML SOPN Inject 1.29m into the skin daily Patient taking differently: Inject 1.2 mg into the skin at bedtime.  03/25/19  Yes LKathyrn Drown MD  LORazepam (ATIVAN) 0.5 MG tablet Take one tab by mouth each night as needed 04/15/19  Yes [name], [name], MD  losartan (COZAAR) 100 MG tablet TAKE 1 TABLET BY MOUTH EVERY DAY 05/13/19  Yes [name], [name], MD  Magnesium 250 MG TABS Take 250 mg by mouth daily.    Yes [provider]   omeprazole (PRILOSEC) 40 MG capsule Take 1 capsule (40 mg total) by mouth daily. 07/14/19  Yes LKathyrn Drown MD  ONETOUCH VERIO test strip TEST ONCE DAILY 05/01/19  Yes LKathyrn Drown MD  potassium chloride (KLOR-CON) 10 MEQ tablet Take one tablet po in the morning and one po in the evening. 08/01/19  Yes LKathyrn Drown MD  pravastatin (PRAVACHOL) 40 MG tablet Take 1 tablet (40 mg total) by mouth daily. 07/15/19  Yes LKathyrn Drown MD  prednisoLONE acetate (PRED FORTE) 1 % ophthalmic suspension INSTILL ONE GTT IN LEFT EYE QID-USE ONLY AFTER SURGERY 06/26/19  Yes [provider]  Simethicone (GAS-X PO) Take 1 capsule by mouth as needed (gas/bloating.).    Yes [provider]  topiramate (TOPAMAX) 50 MG tablet Take 1 tablet (  50 mg total) by mouth 2 (two) times daily. 05/12/19  Yes [name], MD  blood glucose meter kit and supplies KIT Tests once a day  E11.9 04/30/19   [name], MD  Insulin Pen Needle 32G X 4 MM MISC Use with Victoza as directed 12/11/17   [name], MD      Allergies  Allergen Reactions  . Ivp Dye [Iodinated Diagnostic Agents]   . Zoloft [Sertraline Hcl]     Withdrawn,bad dreams    ROS:  Out of a complete 14 system review of symptoms, the patient complains only of the following symptoms, and all other reviewed systems are negative.  Left arm weakness, numbness, clumsiness Headache  Blood pressure (!) 131/93, pulse 99, temperature 97.8 F (36.6 C), temperature source Temporal, height '5\' 2"'  (1.575 m), weight 160 lb (72.6 kg).  Physical Exam  General: The patient is alert and cooperative at the time of the examination.  Eyes: Pupils are equal, round, and reactive to light. Discs are flat bilaterally.  Neck: The neck is supple, no carotid bruits are noted.  Respiratory: The respiratory examination is clear.  Cardiovascular: The cardiovascular examination reveals a regular rate and rhythm, no obvious murmurs or rubs are noted.   Skin: Extremities are without significant edema.  Neurologic Exam  Mental status: The patient is alert and oriented x 3 at the time of the examination. The patient has apparent normal recent and remote memory, with an apparently normal attention span and concentration ability.  Cranial nerves: Facial symmetry is present. There is good sensation of the face to pinprick and soft touch bilaterally. The strength of the facial muscles and the muscles to head turning and shoulder shrug are normal bilaterally. Speech is well enunciated, no aphasia or dysarthria is noted. Extraocular movements are full. Visual fields are full. The tongue is midline, and the patient has symmetric elevation of the soft palate. No obvious hearing deficits are noted.  Motor: The motor testing reveals 5 over 5 strength of all 4 extremities, with exception of 4/5 strength with intrinsic muscle strength of the left hand, some weakness with abduction of the left arm, increased motor tone throughout the left upper extremity. Good symmetric motor tone is otherwise noted throughout.  Sensory: Sensory testing is intact to pinprick, soft touch, vibration sensation, and position sense on all 4 extremities. No evidence of extinction is noted.  Coordination: Cerebellar testing reveals good finger-nose-finger and heel-to-shin bilaterally, with exception of some decreased rapid alternating movements with the left arm, she has difficulty with some tremor with finger-nose-finger on the left.  Gait and station: Gait is normal. Tandem gait is normal. Romberg is negative. No drift is seen.  Reflexes: Deep tendon reflexes are notable for slight increase in the left biceps and left knee jerk reflexes compared to the right, otherwise symmetric. Toes are downgoing bilaterally.   MRI cervical 01/03/19:  IMPRESSION: 1. Advanced multilevel cervical spondylosis as described above. Moderate spinal canal stenosis at C4-C5 with mild atrophy and  myelomalacia of the left hemicord. Diffuse moderate to severe neuroforaminal stenosis. 2. Loss of the normal flow void within the left internal jugular vein. Although this could reflect slow flow, ultrasound is recommended to exclude DVT.  * MRI scan images were reviewed online. I agree with the written report.    Assessment/Plan:  1.  Left upper extremity spasticity  2.  History of migraine headache  The patient has significant spasticity involving the left upper extremity, she has lost  quite a bit of use of the left arm.  Therapy has not been helpful.  The patient likely has spasticity as result of spinal cord injury demonstrated by MRI of the cervical spine.  However, I will check MRI of the brain to exclude the possibility of a slow-growing mass lesion such as a meningioma that may affect the left arm.  The patient could potentially benefit from medications for spasticity in the future.  She will follow-up in about 4 months.  Jill Alexanders MD 09/22/2019 10:26 AM  [facility] [address] [place], [place] [address]  Phone [phone] Fax [phone]

## 2019-10-03 ENCOUNTER — Telehealth: Payer: Self-pay | Admitting: Family Medicine

## 2019-10-03 MED ORDER — LEVOTHYROXINE SODIUM 88 MCG PO TABS: ORAL_TABLET | ORAL | 1 refills | Status: DC

## 2019-10-03 NOTE — Addendum Note (Signed)
Addended by: [name] on: 10/03/2019 09:43 AM   Modules accepted: Orders

## 2019-10-03 NOTE — Telephone Encounter (Signed)
I received a fax from [facility] order pharmacy Please see sheet Verify thyroid dose with patient Then it would be fine to send in 90-day with 1 refill

## 2019-10-03 NOTE — Telephone Encounter (Signed)
Pt contacted and verified thyroid med dose. 90 day supply with one refill sent to [place]

## 2019-10-06 ENCOUNTER — Other Ambulatory Visit: Payer: Self-pay

## 2019-10-06 ENCOUNTER — Encounter: Payer: Self-pay | Admitting: Gastroenterology

## 2019-10-06 ENCOUNTER — Ambulatory Visit (INDEPENDENT_AMBULATORY_CARE_PROVIDER_SITE_OTHER): Payer: PPO | Admitting: Gastroenterology

## 2019-10-06 VITALS — BP 125/89 | HR 86 | Temp 97.1°F | Ht 62.0 in | Wt 158.8 lb

## 2019-10-06 DIAGNOSIS — K5520 Angiodysplasia of colon without hemorrhage: Secondary | ICD-10-CM | POA: Diagnosis not present

## 2019-10-06 DIAGNOSIS — K5900 Constipation, unspecified: Secondary | ICD-10-CM | POA: Insufficient documentation

## 2019-10-06 DIAGNOSIS — D500 Iron deficiency anemia secondary to blood loss (chronic): Secondary | ICD-10-CM

## 2019-10-06 MED ORDER — POLYETHYLENE GLYCOL 3350 17 G PO PACK: 17.0000 g | PACK | Freq: Every day | ORAL | 11 refills | Status: DC | PRN

## 2019-10-06 NOTE — Patient Instructions (Signed)
1. Try MiraLAX for constipation.  Take 1 capful daily as needed.  If you not have a good bowel movement during the day, take a dose first thing the next morning.  If you do have a good bowel movement, you can skip a day.  Prescription sent to your pharmacy, if not covered by your insurance you can buy over-the-counter. 2. I will follow up on CT scans planned for January.  If any additional imaging of your stomach is needed, we will contact you to schedule. 3. Return to the office in 4 months or call sooner if needed.

## 2019-10-06 NOTE — Assessment & Plan Note (Addendum)
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  Follows with hematology.  Last iron infusion in March.  No overt GI bleeding.  Labs in October indicated ferritin of 144, normal hemoglobin.  Return to the office in 4 months.

## 2019-10-06 NOTE — Progress Notes (Signed)
Primary Care Physician: [name], MD  Primary Gastroenterologist:  Barney Drain, MD   Chief Complaint  Patient presents with  . Anemia    f/u    HPI: Joanna [name] is a 68 y.o. female here for follow-up.  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  She had complete evaluation with EGD/colonoscopy and capsule study in 2015/2016.  She has been receiving Feraheme infusions as needed by hematology.  She had a colonoscopy in August 2020 showing diverticulosis, internal/external hemorrhoids, single polyp removed, pathology revealed lipoma.  Plan for next colonoscopy in 10 years.  Labs on July 17, 2019: Normal folate, B12 1089, iron 71, iron saturations 23%, ferritin 144, globin 12, hematocrit 38.1, MCV 91.4, platelets 290,000, white blood cell count 6700.  Last iron infusion March 2020.  Unexplained weight loss, 17 pounds in the past 1 year.  Hematology/oncology ordered CT chest/abdomen/pelvis with contrast in March which showed a solitary right lower lobe solid 4 mm pulmonary nodule, mildly increased in size from 2 mm in 2010.  Likely benign.  Follow-up chest CT in 6 to 12 months planned.  She also had suggestion of wall thickening throughout the fundus in the proximal body of the stomach but no discrete gastric mass.  Stomach collapsed limited complete evaluation.  Weight has been stable since February 2020.  Overall feels well.  Feels with constipation.  Has not tried anything. BM every other day or two. Sometimes hard/strains. No melena, brbpr.  No upper symptoms.  No heartburn on Prilosec.  Continues to take oral iron.  Discussed stomach findings noted on CT in the spring.  Since upper endoscopy not regularly because not evaluated.  Offered upper GI series but she reminded me that she has a follow-up next month.  If stomach looks abnormal on upcoming CT, then would recommend further evaluation.     Current Outpatient Medications  Medication Sig Dispense  Refill  . albuterol (PROVENTIL HFA;VENTOLIN HFA) 108 (90 Base) MCG/ACT inhaler Inhale 2 puffs into the lungs every 4 (four) hours as needed for wheezing or shortness of breath. 1 Inhaler 5  . allopurinol (ZYLOPRIM) 100 MG tablet Take one tablet by mouth daily 90 tablet 1  . amLODipine (NORVASC) 5 MG tablet Take 1 tablet (5 mg total) by mouth daily. 90 tablet 1  . colchicine 0.6 MG tablet 1 bid prn gout 60 tablet 1  . Cyanocobalamin (VITAMIN B-12 PO) Take 1 tablet by mouth daily.    . ferrous sulfate 325 (65 FE) MG tablet Take 325 mg by mouth daily with breakfast.    . fexofenadine (ALLEGRA) 180 MG tablet Take 180 mg by mouth daily.    . fluticasone (FLONASE) 50 MCG/ACT nasal spray PLACE 2 SPRAYS INTO THE NOSE AS NEEDED. 16 g 5  . indapamide (LOZOL) 1.25 MG tablet TAKE 1 TABLET BY MOUTH EVERY MORNING 90 tablet 1  . ketorolac (ACULAR) 0.5 % ophthalmic solution INSTILL 1 DROP INTO THE LEFT EYE QID STARTING 1 WEEK PRIOR TO SURGERY    . Lancets (ONETOUCH DELICA PLUS [id number]) MISC TEST ONCE DAILY 100 each 0  . levothyroxine (SYNTHROID) 88 MCG tablet Take 1/2 tablet by mouth on Mondays and fridays and 1 tablet on all other days 90 tablet 1  . liraglutide (VICTOZA) 18 MG/3ML SOPN Inject 1.39m into the skin daily (Patient taking differently: Inject 1.2 mg into the skin at bedtime. ) 18 mL 4  . LORazepam (ATIVAN) 0.5 MG tablet  Take one tab by mouth each night as needed 30 tablet 3  . losartan (COZAAR) 100 MG tablet TAKE 1 TABLET BY MOUTH EVERY DAY 90 tablet 1  . Magnesium 250 MG TABS Take 250 mg by mouth daily.     Marland Kitchen omeprazole (PRILOSEC) 40 MG capsule Take 1 capsule (40 mg total) by mouth daily. 90 capsule 0  . ONETOUCH VERIO test strip TEST ONCE DAILY 150 strip 0  . potassium chloride (KLOR-CON) 10 MEQ tablet Take one tablet po in the morning and one po in the evening. 180 tablet 1  . pravastatin (PRAVACHOL) 40 MG tablet Take 1 tablet (40 mg total) by mouth daily. 90 tablet 1  . Simethicone (GAS-X PO)  Take 1 capsule by mouth as needed (gas/bloating.).     Marland Kitchen topiramate (TOPAMAX) 50 MG tablet Take 1 tablet (50 mg total) by mouth 2 (two) times daily. 180 tablet 1  . blood glucose meter kit and supplies KIT Tests once a day  E11.9 1 each 0  . Insulin Pen Needle 32G X 4 MM MISC Use with Victoza as directed 100 each 1   No current facility-administered medications for this visit.    Allergies as of 10/06/2019 - Review Complete 10/06/2019  Allergen Reaction Noted  . Ivp dye [iodinated diagnostic agents]  06/04/2013  . Zoloft [sertraline hcl]  05/21/2017    ROS:  General: Negative for anorexia, weight loss, fever, chills, fatigue, weakness. ENT: Negative for hoarseness, difficulty swallowing , nasal congestion. CV: Negative for chest pain, angina, palpitations, dyspnea on exertion, peripheral edema.  Respiratory: Negative for dyspnea at rest, dyspnea on exertion, cough, sputum, wheezing.  GI: See history of present illness. GU:  Negative for dysuria, hematuria, urinary incontinence, urinary frequency, nocturnal urination.  Endo: Negative for unusual weight change.    Physical Examination:   BP 125/89   Pulse 86   Temp (!) 97.1 F (36.2 C) (Oral)   Ht '5\' 2"'  (1.575 m)   Wt 158 lb 12.8 oz (72 kg)   BMI 29.04 kg/m   General: Well-nourished, well-developed in no acute distress.  Eyes: No icterus. Mouth: Masked Abdomen: Bowel sounds are normal, nontender, nondistended, no hepatosplenomegaly or masses, no abdominal bruits or hernia , no rebound or guarding.   Extremities: No lower extremity edema. No clubbing or deformities. Neuro: Alert and oriented x 4   Skin: Warm and dry, no jaundice.   Psych: Alert and cooperative, normal mood and affect.  Labs:  Lab Results  Component Value Date   CREATININE 1.48 (H) 07/21/2019   BUN 20 07/21/2019   NA 146 (H) 07/21/2019   K 3.3 (L) 07/21/2019   CL 107 (H) 07/21/2019   CO2 24 07/21/2019   Lab Results  Component Value Date   ALT 9  07/17/2019   AST 14 (L) 07/17/2019   ALKPHOS 76 07/17/2019   BILITOT 0.4 07/17/2019   Lab Results  Component Value Date   WBC 6.7 07/17/2019   HGB 12.0 07/17/2019   HCT 38.1 07/17/2019   MCV 91.4 07/17/2019   PLT 290 07/17/2019   Lab Results  Component Value Date   IRON 71 07/17/2019   TIBC 307 07/17/2019   FERRITIN 144 07/17/2019   Lab Results  Component Value Date   VITAMINB12 1,089 (H) 07/17/2019   Lab Results  Component Value Date   FOLATE 6.9 07/17/2019    Imaging Studies: No results found.

## 2019-10-06 NOTE — Assessment & Plan Note (Signed)
Mild constipation.  Add MiraLAX 17 g daily as needed.  Return to the office in 4 months.

## 2019-10-24 ENCOUNTER — Inpatient Hospital Stay (HOSPITAL_COMMUNITY): Payer: PPO

## 2019-10-24 ENCOUNTER — Other Ambulatory Visit: Payer: PPO

## 2019-10-24 ENCOUNTER — Ambulatory Visit (HOSPITAL_COMMUNITY): Payer: PPO

## 2019-10-27 ENCOUNTER — Other Ambulatory Visit: Payer: Self-pay

## 2019-10-27 ENCOUNTER — Inpatient Hospital Stay (HOSPITAL_COMMUNITY): Payer: PPO | Attending: Hematology

## 2019-10-27 DIAGNOSIS — N189 Chronic kidney disease, unspecified: Secondary | ICD-10-CM | POA: Insufficient documentation

## 2019-10-27 DIAGNOSIS — E785 Hyperlipidemia, unspecified: Secondary | ICD-10-CM | POA: Diagnosis not present

## 2019-10-27 DIAGNOSIS — Z90722 Acquired absence of ovaries, bilateral: Secondary | ICD-10-CM | POA: Insufficient documentation

## 2019-10-27 DIAGNOSIS — E119 Type 2 diabetes mellitus without complications: Secondary | ICD-10-CM | POA: Diagnosis not present

## 2019-10-27 DIAGNOSIS — I10 Essential (primary) hypertension: Secondary | ICD-10-CM | POA: Diagnosis not present

## 2019-10-27 DIAGNOSIS — Z8249 Family history of ischemic heart disease and other diseases of the circulatory system: Secondary | ICD-10-CM | POA: Diagnosis not present

## 2019-10-27 DIAGNOSIS — D509 Iron deficiency anemia, unspecified: Secondary | ICD-10-CM | POA: Insufficient documentation

## 2019-10-27 DIAGNOSIS — Z791 Long term (current) use of non-steroidal anti-inflammatories (NSAID): Secondary | ICD-10-CM | POA: Insufficient documentation

## 2019-10-27 DIAGNOSIS — Z79899 Other long term (current) drug therapy: Secondary | ICD-10-CM | POA: Diagnosis not present

## 2019-10-27 DIAGNOSIS — F17210 Nicotine dependence, cigarettes, uncomplicated: Secondary | ICD-10-CM | POA: Diagnosis not present

## 2019-10-27 DIAGNOSIS — E538 Deficiency of other specified B group vitamins: Secondary | ICD-10-CM | POA: Diagnosis not present

## 2019-10-27 DIAGNOSIS — Z8349 Family history of other endocrine, nutritional and metabolic diseases: Secondary | ICD-10-CM | POA: Insufficient documentation

## 2019-10-27 DIAGNOSIS — E039 Hypothyroidism, unspecified: Secondary | ICD-10-CM | POA: Insufficient documentation

## 2019-10-27 DIAGNOSIS — Z90710 Acquired absence of both cervix and uterus: Secondary | ICD-10-CM | POA: Insufficient documentation

## 2019-10-27 DIAGNOSIS — Z833 Family history of diabetes mellitus: Secondary | ICD-10-CM | POA: Insufficient documentation

## 2019-10-27 DIAGNOSIS — Z8051 Family history of malignant neoplasm of kidney: Secondary | ICD-10-CM | POA: Diagnosis not present

## 2019-10-27 DIAGNOSIS — Z9079 Acquired absence of other genital organ(s): Secondary | ICD-10-CM | POA: Insufficient documentation

## 2019-10-27 DIAGNOSIS — G4730 Sleep apnea, unspecified: Secondary | ICD-10-CM | POA: Insufficient documentation

## 2019-10-27 LAB — COMPREHENSIVE METABOLIC PANEL
ALT: 14 U/L (ref 0–44)
AST: 15 U/L (ref 15–41)
Albumin: 3.8 g/dL (ref 3.5–5.0)
Alkaline Phosphatase: 79 U/L (ref 38–126)
Anion gap: 10 (ref 5–15)
BUN: 20 mg/dL (ref 8–23)
CO2: 25 mmol/L (ref 22–32)
Calcium: 10.5 mg/dL — ABNORMAL HIGH (ref 8.9–10.3)
Chloride: 110 mmol/L (ref 98–111)
Creatinine, Ser: 1.59 mg/dL — ABNORMAL HIGH (ref 0.44–1.00)
GFR calc Af Amer: 39 mL/min — ABNORMAL LOW (ref >60)
GFR calc non Af Amer: 33 mL/min — ABNORMAL LOW (ref >60)
Glucose, Bld: 101 mg/dL — ABNORMAL HIGH (ref 70–99)
Potassium: 3.2 mmol/L — ABNORMAL LOW (ref 3.5–5.1)
Sodium: 145 mmol/L (ref 135–145)
Total Bilirubin: 0.4 mg/dL (ref 0.3–1.2)
Total Protein: 7 g/dL (ref 6.5–8.1)

## 2019-10-27 LAB — IRON AND TIBC
Iron: 75 ug/dL (ref 28–170)
Saturation Ratios: 26 % (ref 10.4–31.8)
TIBC: 284 ug/dL (ref 250–450)
UIBC: 209 ug/dL

## 2019-10-27 LAB — CBC WITH DIFFERENTIAL/PLATELET
Abs Immature Granulocytes: 0.04 10*3/uL (ref 0.00–0.07)
Basophils Absolute: 0.1 10*3/uL (ref 0.0–0.1)
Basophils Relative: 1 %
Eosinophils Absolute: 0.1 10*3/uL (ref 0.0–0.5)
Eosinophils Relative: 2 %
HCT: 36.4 % (ref 36.0–46.0)
Hemoglobin: 11.4 g/dL — ABNORMAL LOW (ref 12.0–15.0)
Immature Granulocytes: 1 %
Lymphocytes Relative: 29 %
Lymphs Abs: 1.8 10*3/uL (ref 0.7–4.0)
MCH: 28.4 pg (ref 26.0–34.0)
MCHC: 31.3 g/dL (ref 30.0–36.0)
MCV: 90.8 fL (ref 80.0–100.0)
Monocytes Absolute: 0.3 10*3/uL (ref 0.1–1.0)
Monocytes Relative: 6 %
Neutro Abs: 3.7 10*3/uL (ref 1.7–7.7)
Neutrophils Relative %: 61 %
Platelets: 300 10*3/uL (ref 150–400)
RBC: 4.01 MIL/uL (ref 3.87–5.11)
RDW: 14.1 % (ref 11.5–15.5)
WBC: 6 10*3/uL (ref 4.0–10.5)
nRBC: 0 % (ref 0.0–0.2)

## 2019-10-27 LAB — LACTATE DEHYDROGENASE: LDH: 135 U/L (ref 98–192)

## 2019-10-27 LAB — VITAMIN B12: Vitamin B-12: 1377 pg/mL — ABNORMAL HIGH (ref 180–914)

## 2019-10-27 LAB — FOLATE: Folate: 4.5 ng/mL — ABNORMAL LOW (ref >5.9)

## 2019-10-27 LAB — VITAMIN D 25 HYDROXY (VIT D DEFICIENCY, FRACTURES): Vit D, 25-Hydroxy: 46.92 ng/mL (ref 30–100)

## 2019-10-27 LAB — FERRITIN: Ferritin: 125 ng/mL (ref 11–307)

## 2019-10-30 ENCOUNTER — Ambulatory Visit (HOSPITAL_COMMUNITY): Payer: PPO | Admitting: Nurse Practitioner

## 2019-11-07 ENCOUNTER — Ambulatory Visit (HOSPITAL_COMMUNITY)
Admission: RE | Admit: 2019-11-07 | Discharge: 2019-11-07 | Disposition: A | Discharge status: Home / Self Care | Payer: PPO | Source: Ambulatory Visit | Attending: Nurse Practitioner | Admitting: Nurse Practitioner

## 2019-11-07 ENCOUNTER — Telehealth: Payer: Self-pay | Admitting: Family Medicine

## 2019-11-07 ENCOUNTER — Other Ambulatory Visit: Payer: Self-pay

## 2019-11-07 DIAGNOSIS — R918 Other nonspecific abnormal finding of lung field: Secondary | ICD-10-CM | POA: Insufficient documentation

## 2019-11-07 DIAGNOSIS — K5730 Diverticulosis of large intestine without perforation or abscess without bleeding: Secondary | ICD-10-CM | POA: Diagnosis not present

## 2019-11-07 DIAGNOSIS — R634 Abnormal weight loss: Secondary | ICD-10-CM | POA: Insufficient documentation

## 2019-11-07 MED ORDER — OMEPRAZOLE 40 MG PO CPDR: 40.0000 mg | DELAYED_RELEASE_CAPSULE | Freq: Every day | ORAL | 1 refills | Status: DC

## 2019-11-07 NOTE — Telephone Encounter (Signed)
May have 90 with 1 refill 

## 2019-11-07 NOTE — Telephone Encounter (Signed)
Prescription faxed electronically to pharmacy.

## 2019-11-07 NOTE — Telephone Encounter (Signed)
Fax from [facility] requesting  90 day refill on Omeprazole 40 mg capsule. Pt last seen 07/02/2019. Please advise. Thank you.

## 2019-11-07 NOTE — Addendum Note (Signed)
Addended by: Dairl Ponder on: 11/07/2019 04:04 PM   Modules accepted: Orders

## 2019-11-10 ENCOUNTER — Other Ambulatory Visit: Payer: Self-pay

## 2019-11-10 ENCOUNTER — Inpatient Hospital Stay (HOSPITAL_BASED_OUTPATIENT_CLINIC_OR_DEPARTMENT_OTHER): Payer: PPO | Admitting: Hematology

## 2019-11-10 ENCOUNTER — Encounter (HOSPITAL_COMMUNITY): Payer: Self-pay | Admitting: Hematology

## 2019-11-10 VITALS — BP 127/89 | HR 85 | Temp 97.5°F | Resp 18 | Wt 161.0 lb

## 2019-11-10 DIAGNOSIS — D509 Iron deficiency anemia, unspecified: Secondary | ICD-10-CM | POA: Diagnosis not present

## 2019-11-10 MED ORDER — FOLIC ACID 1 MG PO TABS: 1.0000 mg | ORAL_TABLET | Freq: Every day | ORAL | 2 refills | Status: DC

## 2019-11-10 NOTE — Patient Instructions (Addendum)
[place] at [facility] Discharge Instructions  You were seen today by Dr. Delton Coombes. He went over your recent lab results. He would like you to start taking over the counter folic acid daily. He also recommends you have 2 weekly doses of IV Iron. He will see you back in 4 months for labs and follow up.   Thank you for choosing [place] at [facility] to provide your oncology and hematology care.  To afford each patient quality time with our provider, please arrive at least 15 minutes before your scheduled appointment time.   If you have a lab appointment with the [facility] please come in thru the  Main Entrance and check in at the main information desk  You need to re-schedule your appointment should you arrive 10 or more minutes late.  We strive to give you quality time with our providers, and arriving late affects you and other patients whose appointments are after yours.  Also, if you no show three or more times for appointments you may be dismissed from the clinic at the providers discretion.     Again, thank you for choosing [facility].  Our hope is that these requests will decrease the amount of time that you wait before being seen by our physicians.       _____________________________________________________________  Should you have questions after your visit to [facility], please contact our office at [phone] between the hours of 8:00 a.m. and 4:30 p.m.  Voicemails left after 4:00 p.m. will not be returned until the following business day.  For prescription refill requests, have your pharmacy contact our office and allow 72 hours.    Cancer Center Support Programs:   > Cancer Support Group  2nd Tuesday of the month 1pm-2pm, Journey Room

## 2019-11-10 NOTE — Assessment & Plan Note (Signed)
1.  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 -Last Feraheme infusion was on 02/21/2019. -She denies any bleeding per rectum or melena. -We reviewed her labs.  Hemoglobin is 11.4.  Ferritin is 125 and percent saturation is 26. -I have recommended Feraheme weekly x2 as hemoglobin and ferritin are trending down. -We will reevaluate her in 4 months with repeat labs.  2.  Folic acid deficiency: -Folic acid today is 4.5. -We have sent a prescription for folic acid 1 mg tablet daily.  3.  Smoking history: -She is a current active smoker, smokes half pack per day for 50 years. -We reviewed results of CT CAP from 11/07/2019.  New 4 mm right lower lobe lung nodule is nonspecific but likely benign.  Noncontrast chest CT was recommended in 12 months.  Subjective thickening of the greater curvature of the stomach in the region of the fundus and body, exaggerated by under distention of the stomach.  She had EGD in 2016 and stomach was normal.  She does not have any abdominal pain or bloating at this time.  She did not have any weight loss in the last 6 months.

## 2019-11-10 NOTE — Progress Notes (Signed)
[place], [place] [address]   CLINIC:  Medical Oncology/Hematology  PCP:  [name], MD [address] [place] [address] [phone]   REASON FOR VISIT:  Follow-up for  Anemia      INTERVAL HISTORY:  Joanna [name] 69 y.o. female seen for follow-up of normocytic anemia and recent CT scans done for lung nodules.  Appetite and energy levels are 75%.  Denies any cough or expectoration.  Chronic headaches have been stable.  Reports that her left hand has been progressively stiff and weak for the last 2 years and she is seeing a neurologist.  Brain scan was reportedly ordered.  Denies any bleeding per rectum or melena.      REVIEW OF SYSTEMS:  Review of Systems  Constitutional: Positive for fatigue.  Neurological: Positive for headaches.  Psychiatric/Behavioral: Positive for sleep disturbance.  All other systems reviewed and are negative.    PAST MEDICAL/SURGICAL HISTORY:  Past Medical History:  Diagnosis Date  . 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 Bilateral headaches   . Depression   . Diabetes mellitus   . Diverticulosis   . Dyslipidemia   . GERD (gastroesophageal reflux disease)   . Hypertension   . Insomnia   . Microproteinuria   . Renal insufficiency 2010  . Right carpal tunnel syndrome 12/24/2017  . Sleep apnea   . Sleep apnea 12/25/2017   Abnormal sleep study February 2019 CPAP ordered  . Thyroid disease    hypothyroid   Past Surgical History:  Procedure Laterality Date  . ABDOMINAL HYSTERECTOMY  1984  . BACK SURGERY    . BILATERAL OOPHORECTOMY    . CERVICAL DISC SURGERY  01/16/2019  . COLONOSCOPY  01/29/12   Fields-2-3 hyperplastic polyps, mod internal hemorrhoids, diverticulosis throughout colon  . COLONOSCOPY N/A 07/03/2014   Dr. Oneida Alar: Hyperplastic polyps  . COLONOSCOPY N/A 06/06/2019   Dr. Oneida Alar: Diverticulosis, hemorrhoids, single polyp removed,  pathology revealed lipoma.  Next colonoscopy 10 years  . ESOPHAGOGASTRODUODENOSCOPY   01/29/2012   Fields-2-3cm sliding HH, fundic gland polyps, NEGATIVE H pylori  . ESOPHAGOGASTRODUODENOSCOPY N/A 07/03/2014   Dr. Oneida Alar: gastritis and normal small bowel biopsies  . GIV[facility] CAPSULE STUDY N/A 10/26/2014   SMALL BOWEL AVMs  . KNEE ARTHROSCOPY Right   . POLYPECTOMY  06/06/2019   Procedure: POLYPECTOMY;  Surgeon: [name], MD;  Location: [facility];  Service: Endoscopy;;  . TOTAL ABDOMINAL HYSTERECTOMY W/ BILATERAL SALPINGOOPHORECTOMY       SOCIAL HISTORY:  Social History   Socioeconomic History  . Marital status: Divorced    Spouse name: Not on file  . Number of children: 2  . Years of education: Not on file  . Highest education level: Not on file  Occupational History  . Occupation: retired    Comment: 2014 or so  Tobacco Use  . Smoking status: Current Every Day Smoker    Packs/day: 0.25    Years: 30.00    Pack years: 7.50    Types: Cigarettes    Last attempt to quit: 10/16/2010    Years since quitting: 9.0  . Smokeless tobacco: Former Systems developer    Quit date: 10/28/2010  Substance and Sexual Activity  . Alcohol use: Yes    Comment: socially, maybe 3 times a year   . Drug use: No  . Sexual activity: Not on file  Other Topics Concern  . Not on file  Social  History Narrative   Right handed    Lives alone    Caffeine- 5-6 cups per day    Social Determinants of Health   Financial Resource Strain:   . Difficulty of Paying Living Expenses: Not on file  Food Insecurity:   . Worried About Charity fundraiser in the Last Year: Not on file  . Ran Out of Food in the Last Year: Not on file  Transportation Needs:   . Lack of Transportation (Medical): Not on file  . Lack of Transportation (Non-Medical): Not on file  Physical Activity:   . Days of Exercise per Week: Not on file  . Minutes of Exercise per Session: Not on file  Stress:   . Feeling of Stress : Not on file   Social Connections:   . Frequency of Communication with Friends and Family: Not on file  . Frequency of Social Gatherings with Friends and Family: Not on file  . Attends Religious Services: Not on file  . Active Member of Clubs or Organizations: Not on file  . Attends Archivist Meetings: Not on file  . Marital Status: Not on file  Intimate Partner Violence:   . Fear of Current or Ex-Partner: Not on file  . Emotionally Abused: Not on file  . Physically Abused: Not on file  . Sexually Abused: Not on file    FAMILY HISTORY:  Family History  Problem Relation Age of Onset  . Cardiomyopathy Father   . Heart failure Mother   . Hyperlipidemia Mother   . Stroke Brother   . Kidney cancer Brother   . Diabetes Paternal Uncle   . Heart disease Paternal Grandmother   . Colon cancer Neg Hx   . Colon polyps Neg Hx     CURRENT MEDICATIONS:  Outpatient Encounter Medications as of 11/10/2019  Medication Sig Note  . allopurinol (ZYLOPRIM) 100 MG tablet Take one tablet by mouth daily   . amLODipine (NORVASC) 5 MG tablet Take 1 tablet (5 mg total) by mouth daily.   . Cyanocobalamin (VITAMIN B-12 PO) Take 1 tablet by mouth daily.   . ferrous sulfate 325 (65 FE) MG tablet Take 325 mg by mouth daily with breakfast.   . fexofenadine (ALLEGRA) 180 MG tablet Take 180 mg by mouth daily.   . indapamide (LOZOL) 1.25 MG tablet TAKE 1 TABLET BY MOUTH EVERY MORNING   . ketorolac (ACULAR) 0.5 % ophthalmic solution INSTILL 1 DROP INTO THE LEFT EYE QID STARTING 1 WEEK PRIOR TO SURGERY   . Lancets (ONETOUCH DELICA PLUS [id number]) MISC TEST ONCE DAILY   . levothyroxine (SYNTHROID) 88 MCG tablet Take 1/2 tablet by mouth on Mondays and fridays and 1 tablet on all other days   . liraglutide (VICTOZA) 18 MG/3ML SOPN Inject 1.2mg  into the skin daily (Patient taking differently: Inject 1.2 mg into the skin at bedtime. )   . losartan (COZAAR) 100 MG tablet TAKE 1 TABLET BY MOUTH EVERY DAY   . Magnesium 250 MG  TABS Take 250 mg by mouth daily.    Marland Kitchen omeprazole (PRILOSEC) 40 MG capsule Take 1 capsule (40 mg total) by mouth daily.   Glory Rosebush VERIO test strip TEST ONCE DAILY   . potassium chloride (KLOR-CON) 10 MEQ tablet Take one tablet po in the morning and one po in the evening.   . pravastatin (PRAVACHOL) 40 MG tablet Take 1 tablet (40 mg total) by mouth daily.   Marland Kitchen topiramate (TOPAMAX) 50 MG tablet Take 1 tablet (  50 mg total) by mouth 2 (two) times daily.   Marland Kitchen albuterol (PROVENTIL HFA;VENTOLIN HFA) 108 (90 Base) MCG/ACT inhaler Inhale 2 puffs into the lungs every 4 (four) hours as needed for wheezing or shortness of breath. (Patient not taking: Reported on 11/10/2019)   . colchicine 0.6 MG tablet 1 bid prn gout (Patient not taking: Reported on 11/10/2019)   . fluticasone (FLONASE) 50 MCG/ACT nasal spray PLACE 2 SPRAYS INTO THE NOSE AS NEEDED. (Patient not taking: Reported on 11/10/2019) 5/63/8937: otc  . folic acid (FOLVITE) 1 MG tablet Take 1 tablet (1 mg total) by mouth daily.   Marland Kitchen LORazepam (ATIVAN) 0.5 MG tablet Take one tab by mouth each night as needed (Patient not taking: Reported on 11/10/2019)   . polyethylene glycol (MIRALAX / GLYCOLAX) 17 g packet Take 17 g by mouth daily as needed. (Patient not taking: Reported on 11/10/2019)   . Simethicone (GAS-X PO) Take 1 capsule by mouth as needed (gas/bloating.).     No facility-administered encounter medications on file as of 11/10/2019.    ALLERGIES:  Allergies  Allergen Reactions  . Ivp Dye [Iodinated Diagnostic Agents]   . Zoloft [Sertraline Hcl]     Withdrawn,bad dreams     PHYSICAL EXAM:  ECOG Performance status: 1  Vitals:   11/10/19 1049  BP: 127/89  Pulse: 85  Resp: 18  Temp: (!) 97.5 F (36.4 C)  SpO2: 100%   Filed Weights   11/10/19 1049  Weight: 161 lb (73 kg)    Physical Exam Constitutional:      Appearance: Normal appearance.  Cardiovascular:     Rate and Rhythm: Normal rate and regular rhythm.     Heart sounds:  Normal heart sounds.  Pulmonary:     Effort: Pulmonary effort is normal.     Breath sounds: Normal breath sounds.  Abdominal:     General: There is no distension.     Palpations: Abdomen is soft.  Musculoskeletal:        General: No swelling.  Skin:    General: Skin is warm.  Neurological:     General: No focal deficit present.     Mental Status: She is alert and oriented to person, place, and time.  Psychiatric:        Mood and Affect: Mood normal.        Behavior: Behavior normal.      LABORATORY DATA:  I have reviewed the labs as listed.  CBC    Component Value Date/Time   WBC 6.0 10/27/2019 1053   RBC 4.01 10/27/2019 1053   HGB 11.4 (L) 10/27/2019 1053   HGB 10.1 (L) 11/13/2018 1205   HCT 36.4 10/27/2019 1053   HCT 32.5 (L) 11/13/2018 1205   PLT 300 10/27/2019 1053   PLT 366 11/13/2018 1205   MCV 90.8 10/27/2019 1053   MCV 80 11/13/2018 1205   MCH 28.4 10/27/2019 1053   MCHC 31.3 10/27/2019 1053   RDW 14.1 10/27/2019 1053   RDW 16.5 (H) 11/13/2018 1205   LYMPHSABS 1.8 10/27/2019 1053   LYMPHSABS 2.4 11/13/2018 1205   MONOABS 0.3 10/27/2019 1053   EOSABS 0.1 10/27/2019 1053   EOSABS 0.1 11/13/2018 1205   BASOSABS 0.1 10/27/2019 1053   BASOSABS 0.1 11/13/2018 1205   CMP Latest Ref Rng & Units 10/27/2019 07/21/2019 07/17/2019  Glucose 70 - 99 mg/dL 101(H) 122(H) 98  BUN 8 - 23 mg/dL 20 20 24(H)  Creatinine 0.44 - 1.00 mg/dL 1.59(H) 1.48(H) 1.53(H)  Sodium 135 -  145 mmol/L 145 146(H) 144  Potassium 3.5 - 5.1 mmol/L 3.2(L) 3.3(L) 3.2(L)  Chloride 98 - 111 mmol/L 110 107(H) 108  CO2 22 - 32 mmol/L 25 24 26   Calcium 8.9 - 10.3 mg/dL 10.5(H) 10.5(H) 10.5(H)  Total Protein 6.5 - 8.1 g/dL 7.0 - 7.0  Total Bilirubin 0.3 - 1.2 mg/dL 0.4 - 0.4  Alkaline Phos 38 - 126 U/L 79 - 76  AST 15 - 41 U/L 15 - 14(L)  ALT 0 - 44 U/L 14 - 9       DIAGNOSTIC IMAGING:  I have independently reviewed the scans and discussed with the patient.   I have reviewed Joanna Lick  LPN's note and agree with the documentation.  I personally performed a face-to-face visit, made revisions and my assessment and plan is as follows.    ASSESSMENT & PLAN:   Microcytic anemia 1.  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 -Last Feraheme infusion was on 02/21/2019. -She denies any bleeding per rectum or melena. -We reviewed her labs.  Hemoglobin is 11.4.  Ferritin is 125 and percent saturation is 26. -I have recommended Feraheme weekly x2 as hemoglobin and ferritin are trending down. -We will reevaluate her in 4 months with repeat labs.  2.  Folic acid deficiency: -Folic acid today is 4.5. -We have sent a prescription for folic acid 1 mg tablet daily.  3.  Smoking history: -She is a current active smoker, smokes half pack per day for 50 years. -We reviewed results of CT CAP from 11/07/2019.  New 4 mm right lower lobe lung nodule is nonspecific but likely benign.  Noncontrast chest CT was recommended in 12 months.  Subjective thickening of the greater curvature of the stomach in the region of the fundus and body, exaggerated by under distention of the stomach.  She had EGD in 2016 and stomach was normal.  She does not have any abdominal pain or bloating at this time.  She did not have any weight loss in the last 6 months.      Orders placed this encounter:  Orders Placed This Encounter  Procedures  . CBC with Differential/Platelet  . Comprehensive metabolic panel  . Iron and TIBC  . Ferritin  . Vitamin B12  . Folate      [name], MD [place] [phone]

## 2019-11-11 ENCOUNTER — Telehealth: Payer: Self-pay | Admitting: Family Medicine

## 2019-11-11 MED ORDER — LOSARTAN POTASSIUM 100 MG PO TABS: 100.0000 mg | ORAL_TABLET | Freq: Every day | ORAL | 0 refills | Status: DC

## 2019-11-11 NOTE — Telephone Encounter (Signed)
[facility] requesting 90 day supply of Losartan 100 mg tablet. Take one tablet po daily. Pt last seen 07/02/2019 for DM. Please advise. Thank you

## 2019-11-11 NOTE — Telephone Encounter (Signed)
Prescription sent electronically to pharmacy. 

## 2019-11-11 NOTE — Addendum Note (Signed)
Addended by: Dairl Ponder on: 11/11/2019 04:41 PM   Modules accepted: Orders

## 2019-11-11 NOTE — Telephone Encounter (Signed)
May have 90-day, needs follow-up in March

## 2019-11-12 ENCOUNTER — Other Ambulatory Visit: Payer: Self-pay

## 2019-11-12 ENCOUNTER — Other Ambulatory Visit: Payer: PPO

## 2019-11-12 ENCOUNTER — Ambulatory Visit
Admission: RE | Admit: 2019-11-12 | Discharge: 2019-11-12 | Disposition: A | Discharge status: Home / Self Care | Payer: PPO | Source: Ambulatory Visit | Attending: Neurology | Admitting: Neurology

## 2019-11-12 DIAGNOSIS — G8114 Spastic hemiplegia affecting left nondominant side: Secondary | ICD-10-CM

## 2019-11-14 ENCOUNTER — Other Ambulatory Visit: Payer: Self-pay | Admitting: Family Medicine

## 2019-11-14 ENCOUNTER — Other Ambulatory Visit: Payer: Self-pay | Admitting: *Deleted

## 2019-11-15 NOTE — Telephone Encounter (Signed)
May have 90-day but needs to do virtual visit in late spring April

## 2019-11-17 ENCOUNTER — Telehealth: Payer: Self-pay | Admitting: Neurology

## 2019-11-17 ENCOUNTER — Encounter (HOSPITAL_COMMUNITY): Payer: Self-pay

## 2019-11-17 ENCOUNTER — Inpatient Hospital Stay (HOSPITAL_COMMUNITY): Payer: PPO | Attending: Hematology

## 2019-11-17 ENCOUNTER — Other Ambulatory Visit: Payer: Self-pay

## 2019-11-17 VITALS — BP 134/91 | HR 77 | Temp 97.3°F | Resp 18

## 2019-11-17 DIAGNOSIS — E611 Iron deficiency: Secondary | ICD-10-CM

## 2019-11-17 DIAGNOSIS — D509 Iron deficiency anemia, unspecified: Secondary | ICD-10-CM | POA: Diagnosis not present

## 2019-11-17 MED ORDER — TIZANIDINE HCL 2 MG PO TABS: ORAL_TABLET | ORAL | 3 refills | Status: DC

## 2019-11-17 MED ORDER — LOSARTAN POTASSIUM 100 MG PO TABS: 100.0000 mg | ORAL_TABLET | Freq: Every day | ORAL | 0 refills | Status: DC

## 2019-11-17 MED ORDER — SODIUM CHLORIDE 0.9 % IV SOLN
510.0000 mg | Freq: Once | INTRAVENOUS | Status: AC
  Administered 2019-11-17: 510 mg via INTRAVENOUS
  Filled 2019-11-17: qty 17

## 2019-11-17 MED ORDER — POTASSIUM CHLORIDE ER 10 MEQ PO TBCR: EXTENDED_RELEASE_TABLET | ORAL | 0 refills | Status: DC

## 2019-11-17 MED ORDER — SODIUM CHLORIDE 0.9 % IV SOLN: Freq: Once | INTRAVENOUS | Status: AC

## 2019-11-17 NOTE — Patient Instructions (Signed)
[facility]  Discharge Instructions:   _______________________________________________________________  Thank you for choosing [facility] at [facility] to provide your oncology and hematology care.  To afford each patient quality time with our providers, please arrive at least 15 minutes before your scheduled appointment.  You need to re-schedule your appointment if you arrive 10 or more minutes late.  We strive to give you quality time with our providers, and arriving late affects you and other patients whose appointments are after yours.  Also, if you no show three or more times for appointments you may be dismissed from the clinic.  Again, thank you for choosing [facility]. Our hope is that these requests will allow you access to exceptional care and in a timely manner. _______________________________________________________________  If you have questions after your visit, please contact our office at [phone] between the hours of 8:30 a.m. and 5:00 p.m. Voicemails left after 4:30 p.m. will not be returned until the following business day. _______________________________________________________________  For prescription refill requests, have your pharmacy contact our office. _______________________________________________________________  Recommendations made by the consultant and any test results will be sent to your referring physician. _______________________________________________________________ 

## 2019-11-17 NOTE — Telephone Encounter (Signed)
I called the patient.  MRI of the brain shows mild to moderate to small vessel disease, nothing that would cause severe left arm stiffness and clumsiness.  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  I will give a trial on low-dose tizanidine to see if she gets some improvement with the left arm stiffness.   MRI brain 11/12/19:  IMPRESSION: Abnormal MRI scan of the brain showing mild to moderate changes of chronic microvascular disease and generalized cerebral atrophy.  No acute abnormalities are noted

## 2019-11-17 NOTE — Progress Notes (Signed)
Patient presents today for Feraheme infusion. MAR reviewed and updated. Patient has no complaints of any pain or changes since the last visit.   Feraheme given today per MD orders. Tolerated infusion without adverse affects. Vital signs stable. No complaints at this time. Discharged from clinic ambulatory. F/U with [facility] as scheduled.

## 2019-11-17 NOTE — Telephone Encounter (Signed)
-----   Message from Marval Regal, RN sent at 11/17/2019  8:40 AM EST -----  ----- Message ----- From: Marval Regal, RN Sent: 11/13/2019   4:49 PM EST To: Garvin Fila, MD, Marval Regal, RN  DR Leonie Man can you read for Dr Jannifer Franklin thanks

## 2019-11-19 ENCOUNTER — Other Ambulatory Visit: Payer: Self-pay

## 2019-11-19 ENCOUNTER — Ambulatory Visit (INDEPENDENT_AMBULATORY_CARE_PROVIDER_SITE_OTHER): Payer: PPO | Admitting: Family Medicine

## 2019-11-19 DIAGNOSIS — I2510 Atherosclerotic heart disease of native coronary artery without angina pectoris: Secondary | ICD-10-CM | POA: Diagnosis not present

## 2019-11-19 DIAGNOSIS — E785 Hyperlipidemia, unspecified: Secondary | ICD-10-CM

## 2019-11-19 DIAGNOSIS — Z79899 Other long term (current) drug therapy: Secondary | ICD-10-CM | POA: Diagnosis not present

## 2019-11-19 DIAGNOSIS — E119 Type 2 diabetes mellitus without complications: Secondary | ICD-10-CM

## 2019-11-19 DIAGNOSIS — I10 Essential (primary) hypertension: Secondary | ICD-10-CM | POA: Diagnosis not present

## 2019-11-19 DIAGNOSIS — I700 Atherosclerosis of aorta: Secondary | ICD-10-CM | POA: Diagnosis not present

## 2019-11-19 DIAGNOSIS — I2583 Coronary atherosclerosis due to lipid rich plaque: Secondary | ICD-10-CM | POA: Diagnosis not present

## 2019-11-19 DIAGNOSIS — R911 Solitary pulmonary nodule: Secondary | ICD-10-CM | POA: Diagnosis not present

## 2019-11-19 MED ORDER — POTASSIUM CHLORIDE ER 10 MEQ PO TBCR: EXTENDED_RELEASE_TABLET | ORAL | 1 refills | Status: DC

## 2019-11-19 MED ORDER — ROSUVASTATIN CALCIUM 20 MG PO TABS: ORAL_TABLET | ORAL | 1 refills | Status: DC

## 2019-11-19 MED ORDER — CEPHALEXIN 500 MG PO CAPS: ORAL_CAPSULE | ORAL | 0 refills | Status: DC

## 2019-11-19 MED ORDER — AMLODIPINE BESYLATE 5 MG PO TABS: 5.0000 mg | ORAL_TABLET | Freq: Every day | ORAL | 1 refills | Status: DC

## 2019-11-19 NOTE — Progress Notes (Signed)
Subjective:    Patient ID: Joanna [name], female    DOB: [birth year], 69 y.o.   MRN: [id number]  Hypertension This is a chronic problem. Pertinent negatives include no chest pain or shortness of breath. There are no compliance problems.   Patient states take medicine on a regular basis denies any issues with blood pressure She has not been checking her sugars frequently.  She states she tries to eat healthy.  Patient will be due some blood work in the near future She also has coronary artery disease on her CAT scan although she denies any type of chest tightness pressure pain or shortness of breath currently. We will aggressively manage risk factors to lessen her risk of heart disease She does take her thyroid medicine regular basis states her energy level doing okay she we will need to do lab work Recent CAT scan also showed a pulmonary nodule which she was told was normal and benign but we will check to see if this is stable or if she does not fact need to have a follow-up CT Pt needing visit for medication refills. Pt states she is doing ok. Does not check sugars as often as directed but does take medications daily. Dr.Willis has added Tizanidine 2 mg.  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 Pt has been having frequency and burning since Saturday. Has tried cranberry pills and AZO.  She just describes frequency dysuria urinary frequency  Virtual Visit via Video Note  I connected with Roselia R Matte on 11/19/19 at 11:00 AM EST by a video enabled telemedicine application and verified that I am speaking with the correct person using two identifiers.  Location: Patient: home Provider: office   I discussed the limitations of evaluation and management by telemedicine and the availability of in person appointments. The patient expressed  understanding and agreed to proceed.  History of Present Illness:    Observations/Objective:   Assessment and Plan:   Follow Up Instructions:    I discussed the assessment and treatment plan with the patient. The patient was provided an opportunity to ask questions and all were answered. The patient agreed with the plan and demonstrated an understanding of the instructions.   The patient was advised to call back or seek an in-person evaluation if the symptoms worsen or if the condition fails to improve as anticipated.  I provided 30 minutes of non-face-to-face time during this encounter.       Review of Systems  Constitutional: Positive for fatigue. Negative for activity change and appetite change.  HENT: Negative for congestion and rhinorrhea.   Respiratory: Negative for cough and shortness of breath.   Cardiovascular: Negative for chest pain and leg swelling.  Gastrointestinal: Negative for abdominal pain and diarrhea.  Endocrine: Negative for polydipsia and polyphagia.  Skin: Negative for color change.  Neurological: Positive for weakness. Negative for dizziness.  Psychiatric/Behavioral: Negative for behavioral problems and confusion.       Objective:   Physical Exam    Patient had virtual visit Appears to be in no distress Atraumatic Neuro able to relate and oriented No apparent resp distress Color normal     Assessment & Plan:  1. Essential hypertension, benign HTN she does take her blood pressure medicines as directed watches her diet closely - Hemoglobin A1c - Lipid Profile -  Hepatic function panel - Basic Metabolic Panel (BMET)  2. Type 2 diabetes mellitus with hemoglobin A1c goal of less than 7.5% (HCC) She states diabetes under decent control she does take her medicines she does not check her sugars frequently lab work indicated she will do this by early spring - Hemoglobin A1c - Lipid Profile - Hepatic function panel - Basic Metabolic Panel  (BMET)  3. Hyperlipidemia, unspecified hyperlipidemia type Previous labs reviewed new labs ordered continue current medication.  Get cholesterol under good control to lessen the risk of coronary artery disease and cardiovascular disease CAT scan which was recently done was reviewed - Hemoglobin A1c - Lipid Profile - Hepatic function panel - Basic Metabolic Panel (BMET)  4. High risk medication use Liver enzymes ordered await the results of this watch diet try to keep weight under control - Hemoglobin A1c - Lipid Profile - Hepatic function panel - Basic Metabolic Panel (BMET)  5. Pulmonary nodule Apparently she states is been around for a while we have to look at her other CT scans to see if this is the case if not she will need to have a follow-up CT in 12 months  6. Coronary artery disease due to lipid rich plaque Getting her cholesterol under strict control very important as well as the A1c she would do her lab work in the near future take her medicines on a regular basis  7. 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

## 2019-11-20 NOTE — Progress Notes (Signed)
Kindly inform pt that mri scan of brain shows only mild shrinkage of brain and hardening of arteries

## 2019-11-28 ENCOUNTER — Ambulatory Visit (HOSPITAL_COMMUNITY): Payer: PPO

## 2019-12-04 ENCOUNTER — Ambulatory Visit (HOSPITAL_COMMUNITY): Payer: PPO

## 2019-12-06 ENCOUNTER — Other Ambulatory Visit: Payer: Self-pay | Admitting: Family Medicine

## 2019-12-08 ENCOUNTER — Inpatient Hospital Stay (HOSPITAL_COMMUNITY): Payer: PPO

## 2019-12-08 ENCOUNTER — Other Ambulatory Visit: Payer: Self-pay

## 2019-12-08 VITALS — BP 108/65 | HR 84 | Temp 97.7°F | Resp 18

## 2019-12-08 DIAGNOSIS — D509 Iron deficiency anemia, unspecified: Secondary | ICD-10-CM

## 2019-12-08 DIAGNOSIS — E611 Iron deficiency: Secondary | ICD-10-CM

## 2019-12-08 MED ORDER — SODIUM CHLORIDE 0.9 % IV SOLN
510.0000 mg | Freq: Once | INTRAVENOUS | Status: AC
  Administered 2019-12-08: 510 mg via INTRAVENOUS
  Filled 2019-12-08: qty 17

## 2019-12-08 MED ORDER — SODIUM CHLORIDE 0.9 % IV SOLN: Freq: Once | INTRAVENOUS | Status: AC

## 2019-12-08 NOTE — Progress Notes (Signed)
Iron given per orders. Patient tolerated it well without problems. Vitals stable and discharged home from clinic ambulatory. Follow up as scheduled.  

## 2019-12-11 ENCOUNTER — Other Ambulatory Visit: Payer: Self-pay | Admitting: Family Medicine

## 2020-01-09 ENCOUNTER — Telehealth: Payer: Self-pay | Admitting: Family Medicine

## 2020-01-09 NOTE — Telephone Encounter (Signed)
Patient is requesting refill on CPAP supplies. Tubing,mask, filters called into [facility]

## 2020-01-09 NOTE — Telephone Encounter (Signed)
Script faxed to [facility] supplies as requested.

## 2020-01-27 DIAGNOSIS — E119 Type 2 diabetes mellitus without complications: Secondary | ICD-10-CM | POA: Diagnosis not present

## 2020-01-27 DIAGNOSIS — Z79899 Other long term (current) drug therapy: Secondary | ICD-10-CM | POA: Diagnosis not present

## 2020-01-27 DIAGNOSIS — E785 Hyperlipidemia, unspecified: Secondary | ICD-10-CM | POA: Diagnosis not present

## 2020-01-27 DIAGNOSIS — I10 Essential (primary) hypertension: Secondary | ICD-10-CM | POA: Diagnosis not present

## 2020-01-28 LAB — HEMOGLOBIN A1C
Est. average glucose Bld gHb Est-mCnc: 117 mg/dL
Hgb A1c MFr Bld: 5.7 % — ABNORMAL HIGH (ref 4.8–5.6)

## 2020-01-28 LAB — LIPID PANEL
Chol/HDL Ratio: 2 ratio (ref 0.0–4.4)
Cholesterol, Total: 129 mg/dL (ref 100–199)
HDL: 66 mg/dL (ref >39)
LDL Chol Calc (NIH): 49 mg/dL (ref 0–99)
Triglycerides: 71 mg/dL (ref 0–149)
VLDL Cholesterol Cal: 14 mg/dL (ref 5–40)

## 2020-01-28 LAB — BASIC METABOLIC PANEL
BUN/Creatinine Ratio: 10 — ABNORMAL LOW (ref 12–28)
BUN: 17 mg/dL (ref 8–27)
CO2: 23 mmol/L (ref 20–29)
Calcium: 10.4 mg/dL — ABNORMAL HIGH (ref 8.7–10.3)
Chloride: 107 mmol/L — ABNORMAL HIGH (ref 96–106)
Creatinine, Ser: 1.71 mg/dL — ABNORMAL HIGH (ref 0.57–1.00)
GFR calc Af Amer: 35 mL/min/{1.73_m2} — ABNORMAL LOW (ref >59)
GFR calc non Af Amer: 31 mL/min/{1.73_m2} — ABNORMAL LOW (ref >59)
Glucose: 91 mg/dL (ref 65–99)
Potassium: 3.2 mmol/L — ABNORMAL LOW (ref 3.5–5.2)
Sodium: 147 mmol/L — ABNORMAL HIGH (ref 134–144)

## 2020-01-28 LAB — HEPATIC FUNCTION PANEL
ALT: 11 IU/L (ref 0–32)
AST: 17 IU/L (ref 0–40)
Albumin: 4.4 g/dL (ref 3.8–4.8)
Alkaline Phosphatase: 107 IU/L (ref 39–117)
Bilirubin Total: 0.2 mg/dL (ref 0.0–1.2)
Bilirubin, Direct: 0.07 mg/dL (ref 0.00–0.40)
Total Protein: 6.5 g/dL (ref 6.0–8.5)

## 2020-01-30 ENCOUNTER — Other Ambulatory Visit: Payer: Self-pay | Admitting: Family Medicine

## 2020-01-30 DIAGNOSIS — N289 Disorder of kidney and ureter, unspecified: Secondary | ICD-10-CM

## 2020-01-30 MED ORDER — POTASSIUM CHLORIDE ER 10 MEQ PO TBCR: EXTENDED_RELEASE_TABLET | ORAL | 1 refills | Status: DC

## 2020-01-30 NOTE — Progress Notes (Signed)
Error

## 2020-02-04 ENCOUNTER — Other Ambulatory Visit (HOSPITAL_COMMUNITY): Payer: Self-pay | Admitting: Hematology

## 2020-02-06 ENCOUNTER — Other Ambulatory Visit: Payer: Self-pay

## 2020-02-06 ENCOUNTER — Encounter: Payer: Self-pay | Admitting: Gastroenterology

## 2020-02-06 ENCOUNTER — Ambulatory Visit: Payer: PPO | Admitting: Gastroenterology

## 2020-02-06 VITALS — BP 125/84 | HR 85 | Temp 96.9°F | Ht 62.0 in | Wt 157.8 lb

## 2020-02-06 DIAGNOSIS — R933 Abnormal findings on diagnostic imaging of other parts of digestive tract: Secondary | ICD-10-CM

## 2020-02-06 DIAGNOSIS — D500 Iron deficiency anemia secondary to blood loss (chronic): Secondary | ICD-10-CM | POA: Diagnosis not present

## 2020-02-06 DIAGNOSIS — G4733 Obstructive sleep apnea (adult) (pediatric): Secondary | ICD-10-CM | POA: Diagnosis not present

## 2020-02-06 NOTE — Assessment & Plan Note (Signed)
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 Overall has been stable. She did receive iron infusions a couple of months ago, preinfusion ferritin of 125/iron 75/hemoglobin 11.4. Plans for follow-up labs with hematology.

## 2020-02-06 NOTE — Progress Notes (Addendum)
REVIEWED. REPEAT EGD.   Primary Care Physician: [name], MD  Primary Gastroenterologist:  Barney Drain, MD   Chief Complaint  Patient presents with  . Anemia    HPI: Joanna [name] is a 69 y.o. female here for follow-up.  Last seen in December 2020.  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  She has been receiving Feraheme infusions as needed by hematology.  She had a colonoscopy in August 2020 showing diverticulosis, internal/external hemorrhoids, single polyp removed, pathology revealed lipoma.  Plan for next colonoscopy in 10 years.  Last iron infusion in February.  Prior to infusion her ferritin was 125, iron 75, iron saturation 26%, hemoglobin 11.4. Overall she has been doing well. Bowel movements are regular. No melena or rectal bleeding. No abdominal pain. No heartburn on omeprazole. No dysphagia. Weight has been stable over the past 1 year.   She had a CT chest/abdomen/pelvis without contrast January 2021 for weight loss, ordered by Francene Finders, NP. IMPRESSION: 1. Extensive colonic diverticulosis with evidence of mild acute diverticulitis in the distal descending colon. No definite signs of diverticular abscess or frank perforation. 2. Subjective thickening of the greater curvature of the stomach in the region of the fundus and body. This could be exaggerated by under distension of the stomach, however, correlation with endoscopy should be considered to exclude the possibility of an infiltrative gastric mass. 3. New 4 mm right lower lobe pulmonary nodule, nonspecific, but statistically likely benign. No follow-up needed if patient is low-risk. Non-contrast chest CT can be considered in 12 months if patient is high-risk. This recommendation follows the consensus statement: Guidelines for Management of Incidental Pulmonary Nodules Detected on CT Images:  From the Fleischner Society 2017; Radiology 2017; 284:228-243. 4. Aortic atherosclerosis, in addition to left main and 3 vessel coronary artery disease. Please note that although the presence of coronary artery calcium documents the presence of coronary artery disease, the severity of this disease and any potential stenosis cannot be assessed on this non-gated CT examination. Assessment for potential risk factor modification, dietary therapy or pharmacologic therapy may be warranted, if clinically indicated. 5. Additional incidental findings, as above.   Current Outpatient Medications  Medication Sig Dispense Refill  . albuterol (PROVENTIL HFA;VENTOLIN HFA) 108 (90 Base) MCG/ACT inhaler Inhale 2 puffs into the lungs every 4 (four) hours as needed for wheezing or shortness of breath. 1 Inhaler 5  . allopurinol (ZYLOPRIM) 100 MG tablet Take one tablet by mouth daily 90 tablet 1  . amLODipine (NORVASC) 5 MG tablet Take 1 tablet (5 mg total) by mouth daily. 90 tablet 1  . colchicine 0.6 MG tablet 1 bid prn gout 60 tablet 1  . Cyanocobalamin (VITAMIN B-12 PO) Take 1 tablet by mouth daily.    . ferrous sulfate 325 (65 FE) MG tablet Take 325 mg by mouth daily with breakfast.    . fexofenadine (ALLEGRA) 180 MG tablet Take 180 mg by mouth daily.    . fluticasone (FLONASE) 50 MCG/ACT nasal spray PLACE 2 SPRAYS INTO THE NOSE AS NEEDED. 16 g 5  . folic acid (FOLVITE) 1 MG tablet TAKE 1 TABLET(1 MG) BY MOUTH DAILY 30 tablet 2  . indapamide (LOZOL) 1.25 MG tablet Take 1 tablet by mouth every morning 90 tablet 1  . Lancets (ONETOUCH DELICA PLUS [id number]) MISC TEST ONCE DAILY 100 each 0  . levothyroxine (SYNTHROID) 88 MCG tablet  Take 1/2 tablet by mouth on Mondays and fridays and 1 tablet on all other days 90 tablet 1  . liraglutide (VICTOZA) 18 MG/3ML SOPN Inject 1.2mg  into the skin daily (Patient taking differently: Inject 1.2 mg into the skin at bedtime. ) 18 mL 4  . losartan (COZAAR) 100 MG tablet  Take 1 tablet (100 mg total) by mouth daily. 90 tablet 0  . Magnesium 250 MG TABS Take 250 mg by mouth daily.     Marland Kitchen omeprazole (PRILOSEC) 40 MG capsule Take 1 capsule (40 mg total) by mouth daily. 90 capsule 1  . ONETOUCH VERIO test strip TEST ONCE DAILY 150 strip 0  . potassium chloride (KLOR-CON) 10 MEQ tablet Take one tablet po in the morning and two  po in the evening. 180 tablet 1  . rosuvastatin (CRESTOR) 20 MG tablet Take one tablet po each day 90 tablet 1  . Simethicone (GAS-X PO) Take 1 capsule by mouth as needed (gas/bloating.).     Marland Kitchen tiZANidine (ZANAFLEX) 2 MG tablet 1 tablet twice daily for 2 weeks and then take 1 tablet 3 times daily 90 tablet 3  . topiramate (TOPAMAX) 50 MG tablet Take 1 tablet by mouth twice a day 180 tablet 1   No current facility-administered medications for this visit.    Allergies as of 02/06/2020 - Review Complete 02/06/2020  Allergen Reaction Noted  . Ivp dye [iodinated diagnostic agents]  06/04/2013  . Zoloft [sertraline hcl]  05/21/2017    ROS:  General: Negative for anorexia, weight loss, fever, chills, fatigue, weakness. ENT: Negative for hoarseness, difficulty swallowing , nasal congestion. CV: Negative for chest pain, angina, palpitations, dyspnea on exertion, peripheral edema.  Respiratory: Negative for dyspnea at rest, dyspnea on exertion, cough, sputum, wheezing.  GI: See history of present illness. GU:  Negative for dysuria, hematuria, urinary incontinence, urinary frequency, nocturnal urination.  Endo: Negative for unusual weight change.    Physical Examination:   BP 125/84   Pulse 85   Temp (!) 96.9 F (36.1 C) (Temporal)   Ht 5\' 2"  (1.575 m)   Wt 157 lb 12.8 oz (71.6 kg)   BMI 28.86 kg/m   General: Well-nourished, well-developed in no acute distress.  Eyes: No icterus. Mouth:masked Lungs: Clear to auscultation bilaterally.  Heart: Regular rate and rhythm, no murmurs rubs or gallops.  Abdomen: Bowel sounds are normal,  nontender, nondistended, no hepatosplenomegaly or masses, no abdominal bruits or hernia , no rebound or guarding.   Extremities: No lower extremity edema. No clubbing or deformities. Neuro: Alert and oriented x 4   Skin: Warm and dry, no jaundice.   Psych: Alert and cooperative, normal mood and affect.  Labs:  Lab Results  Component Value Date   CREATININE 1.71 (H) 01/27/2020   BUN 17 01/27/2020   NA 147 (H) 01/27/2020   K 3.2 (L) 01/27/2020   CL 107 (H) 01/27/2020   CO2 23 01/27/2020   Lab Results  Component Value Date   WBC 6.0 10/27/2019   HGB 11.4 (L) 10/27/2019   HCT 36.4 10/27/2019   MCV 90.8 10/27/2019   PLT 300 10/27/2019   Lab Results  Component Value Date   IRON 75 10/27/2019   TIBC 284 10/27/2019   FERRITIN 125 10/27/2019   Lab Results  Component Value Date   ALT 11 01/27/2020   AST 17 01/27/2020   ALKPHOS 107 01/27/2020   BILITOT 0.2 01/27/2020     Imaging Studies: No results found.

## 2020-02-06 NOTE — Assessment & Plan Note (Signed)
Clinically feeling well. She has had CT abdomen without contrast January 2021 ordered by hematology for weight loss. Subjective thickening of the greater curvature of the stomach in the region of the fundus and body. Could be exaggerated by under distention of the stomach, however correlation with endoscopy should be considered to exclude the possibility of infiltrative gastric mass per radiology. Notably patient had similar findings 1 year prior on noncontrast CT. Last EGD in 2015. Patient declined work-up of abnormal stomach on CT finding in 2020 due to recent neck surgery at the time. Will address findings with Dr. Oneida Alar and determine further work up.

## 2020-02-06 NOTE — Patient Instructions (Signed)
1. We will be in touch next week and let you know if Dr. Oneida Alar advises and upper endoscopy or stomach xray to evaluate CT findings.  2. Please note, Dr. Oneida Alar is leaving 02/2020. Dr. Abbey Chatters will be taking over her patients 05/2020.

## 2020-02-09 ENCOUNTER — Telehealth: Payer: Self-pay | Admitting: Gastroenterology

## 2020-02-09 NOTE — Telephone Encounter (Signed)
Please let pt know that [facility] recommends EGD to evaluate possible gastric wall thickening seen on CT 10/2019 and 2020. She declined EGD in 2020 due to neck surgery at that time.   If agreeable,   Schedule EGD with conscious sedation. Dx: abnormal stomach on CT.

## 2020-02-09 NOTE — Telephone Encounter (Signed)
Tried to call pt, call went straight to VM, LMOVM for return call. 

## 2020-02-09 NOTE — Telephone Encounter (Signed)
Pt called office, EGD w/[facility] scheduled for 02/19/20 at 12:00pm. Instructions given on phone and sent to pt via MyChart. COVID test 02/17/20. Letter also mailed with procedure instructions.

## 2020-02-10 ENCOUNTER — Encounter: Payer: Self-pay | Admitting: Family Medicine

## 2020-02-17 ENCOUNTER — Other Ambulatory Visit: Payer: Self-pay

## 2020-02-17 ENCOUNTER — Other Ambulatory Visit (HOSPITAL_COMMUNITY)
Admission: RE | Admit: 2020-02-17 | Discharge: 2020-02-17 | Disposition: A | Discharge status: Home / Self Care | Payer: PPO | Source: Ambulatory Visit | Attending: Gastroenterology | Admitting: Gastroenterology

## 2020-02-17 DIAGNOSIS — Z20822 Contact with and (suspected) exposure to covid-19: Secondary | ICD-10-CM | POA: Diagnosis not present

## 2020-02-17 DIAGNOSIS — Z01812 Encounter for preprocedural laboratory examination: Secondary | ICD-10-CM | POA: Insufficient documentation

## 2020-02-18 LAB — SARS CORONAVIRUS 2 (TAT 6-24 HRS): SARS Coronavirus 2: NEGATIVE

## 2020-02-19 ENCOUNTER — Encounter (HOSPITAL_COMMUNITY)
Admission: RE | Disposition: A | Discharge status: Home / Self Care | Payer: Self-pay | Source: Home / Self Care | Attending: Gastroenterology

## 2020-02-19 ENCOUNTER — Ambulatory Visit (HOSPITAL_COMMUNITY)
Admission: RE | Admit: 2020-02-19 | Discharge: 2020-02-19 | Disposition: A | Discharge status: Home / Self Care | Payer: PPO | Attending: Gastroenterology | Admitting: Gastroenterology

## 2020-02-19 ENCOUNTER — Other Ambulatory Visit: Payer: Self-pay

## 2020-02-19 ENCOUNTER — Telehealth: Payer: Self-pay | Admitting: Family Medicine

## 2020-02-19 DIAGNOSIS — Z7984 Long term (current) use of oral hypoglycemic drugs: Secondary | ICD-10-CM | POA: Insufficient documentation

## 2020-02-19 DIAGNOSIS — Z8719 Personal history of other diseases of the digestive system: Secondary | ICD-10-CM | POA: Insufficient documentation

## 2020-02-19 DIAGNOSIS — F17210 Nicotine dependence, cigarettes, uncomplicated: Secondary | ICD-10-CM | POA: Insufficient documentation

## 2020-02-19 DIAGNOSIS — Z79890 Hormone replacement therapy (postmenopausal): Secondary | ICD-10-CM | POA: Insufficient documentation

## 2020-02-19 DIAGNOSIS — G4730 Sleep apnea, unspecified: Secondary | ICD-10-CM | POA: Diagnosis not present

## 2020-02-19 DIAGNOSIS — E039 Hypothyroidism, unspecified: Secondary | ICD-10-CM | POA: Insufficient documentation

## 2020-02-19 DIAGNOSIS — E785 Hyperlipidemia, unspecified: Secondary | ICD-10-CM | POA: Diagnosis not present

## 2020-02-19 DIAGNOSIS — R933 Abnormal findings on diagnostic imaging of other parts of digestive tract: Secondary | ICD-10-CM | POA: Diagnosis not present

## 2020-02-19 DIAGNOSIS — Z79899 Other long term (current) drug therapy: Secondary | ICD-10-CM | POA: Insufficient documentation

## 2020-02-19 DIAGNOSIS — I10 Essential (primary) hypertension: Secondary | ICD-10-CM | POA: Diagnosis not present

## 2020-02-19 DIAGNOSIS — K219 Gastro-esophageal reflux disease without esophagitis: Secondary | ICD-10-CM | POA: Insufficient documentation

## 2020-02-19 DIAGNOSIS — E119 Type 2 diabetes mellitus without complications: Secondary | ICD-10-CM | POA: Diagnosis not present

## 2020-02-19 DIAGNOSIS — K3189 Other diseases of stomach and duodenum: Secondary | ICD-10-CM | POA: Diagnosis not present

## 2020-02-19 HISTORY — PX: ESOPHAGOGASTRODUODENOSCOPY: SHX5428

## 2020-02-19 HISTORY — PX: BIOPSY: SHX5522

## 2020-02-19 LAB — GLUCOSE, CAPILLARY: Glucose-Capillary: 102 mg/dL — ABNORMAL HIGH (ref 70–99)

## 2020-02-19 SURGERY — EGD (ESOPHAGOGASTRODUODENOSCOPY)
Anesthesia: Moderate Sedation

## 2020-02-19 MED ORDER — MEPERIDINE HCL 100 MG/ML IJ SOLN
INTRAMUSCULAR | Status: AC
  Filled 2020-02-19: qty 2

## 2020-02-19 MED ORDER — MEPERIDINE HCL 100 MG/ML IJ SOLN
INTRAMUSCULAR | Status: DC | PRN
  Administered 2020-02-19 (×2): 25 mg via INTRAVENOUS

## 2020-02-19 MED ORDER — STERILE WATER FOR IRRIGATION IR SOLN
Status: DC | PRN
  Administered 2020-02-19: 2.5 mL

## 2020-02-19 MED ORDER — LIDOCAINE VISCOUS HCL 2 % MT SOLN
OROMUCOSAL | Status: AC
  Filled 2020-02-19: qty 15

## 2020-02-19 MED ORDER — LIDOCAINE VISCOUS HCL 2 % MT SOLN
OROMUCOSAL | Status: DC | PRN
  Administered 2020-02-19: 1 via OROMUCOSAL

## 2020-02-19 MED ORDER — SODIUM CHLORIDE 0.9 % IV SOLN: INTRAVENOUS | Status: DC

## 2020-02-19 MED ORDER — MIDAZOLAM HCL 5 MG/5ML IJ SOLN
INTRAMUSCULAR | Status: AC
  Filled 2020-02-19: qty 10

## 2020-02-19 MED ORDER — MIDAZOLAM HCL 5 MG/5ML IJ SOLN
INTRAMUSCULAR | Status: DC | PRN
  Administered 2020-02-19: 2 mg via INTRAVENOUS
  Administered 2020-02-19: 1 mg via INTRAVENOUS

## 2020-02-19 NOTE — Telephone Encounter (Signed)
[facility] calling and needed patient insurance information and Diagnostic code. They are wanting patient demographic.   CB# [id number] [phone]

## 2020-02-19 NOTE — H&P (Signed)
Primary Care Physician:  Joanna Drown, MD Primary Gastroenterologist:  Dr. Oneida Alar  Pre-Procedure History & Physical: HPI:  Joanna [name] is [name] 69 y.o. female here for Abnormal CT scan JAN 2021: some mural thickening, particularly along the greater curvature of the stomach in the region of the fundus and body.   Past Medical History:  Diagnosis Date  . 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 Bilateral headaches   . Depression   . Diabetes mellitus   . Diverticulosis   . Dyslipidemia   . GERD (gastroesophageal reflux disease)   . Hypertension   . Insomnia   . Microproteinuria   . Renal insufficiency 2010  . Right carpal tunnel syndrome 12/24/2017  . Sleep apnea   . Sleep apnea 12/25/2017   Abnormal sleep study February 2019 CPAP ordered  . Thyroid disease    hypothyroid    Past Surgical History:  Procedure Laterality Date  . ABDOMINAL HYSTERECTOMY  1984  . BACK SURGERY    . BILATERAL OOPHORECTOMY    . CERVICAL DISC SURGERY  01/16/2019  . COLONOSCOPY  01/29/12   Joanna [name]-2-3 hyperplastic polyps, mod internal hemorrhoids, diverticulosis throughout colon  . COLONOSCOPY N/[name] 07/03/2014   Dr. Oneida Alar: Hyperplastic polyps  . COLONOSCOPY N/[name] 06/06/2019   Dr. Oneida Alar: Diverticulosis, hemorrhoids, single polyp removed, pathology revealed lipoma.  Next colonoscopy 10 years  . ESOPHAGOGASTRODUODENOSCOPY   01/29/2012   Joanna [name]-2-3cm sliding HH, fundic gland polyps, NEGATIVE H pylori  . ESOPHAGOGASTRODUODENOSCOPY N/[name] 07/03/2014   Dr. Oneida Alar: gastritis and normal small bowel biopsies  . GIVENS CAPSULE STUDY N/[name] 10/26/2014   SMALL BOWEL AVMs  . KNEE ARTHROSCOPY Right   . POLYPECTOMY  06/06/2019   Procedure: POLYPECTOMY;  Surgeon: Joanna Binder, MD;  Location: [facility];  Service: Endoscopy;;  . TOTAL ABDOMINAL HYSTERECTOMY W/ BILATERAL SALPINGOOPHORECTOMY      Prior to Admission medications   Medication Sig Start Date End Date Taking? Authorizing  Provider  acetaminophen (TYLENOL) 500 MG tablet Take 1,000 mg by mouth every 8 (eight) hours as needed for moderate pain.   Yes [provider]  allopurinol (ZYLOPRIM) 100 MG tablet Take one tablet by mouth daily Patient taking differently: Take 100 mg by mouth daily. Take one tablet by mouth daily 07/02/19  Yes [name], Joanna A, MD  amLODipine (NORVASC) 5 MG tablet Take 1 tablet (5 mg total) by mouth daily. 11/19/19  Yes [name], Joanna Snare, MD  Cyanocobalamin (VITAMIN B-12 PO) Take 1 tablet by mouth daily.   Yes [provider]  ferrous sulfate 325 (65 FE) MG tablet Take 325 mg by mouth daily with breakfast.   Yes [provider]  fexofenadine (ALLEGRA) 180 MG tablet Take 180 mg by mouth daily.   Yes [provider]  fluticasone (FLONASE) 50 MCG/ACT nasal spray PLACE 2 SPRAYS INTO THE NOSE AS NEEDED. Patient taking differently: Place 2 sprays into both nostrils daily as needed for allergies.  02/18/15  Yes [name], MD  folic acid (FOLVITE) 1 MG tablet TAKE 1 TABLET(1 MG) BY MOUTH DAILY Patient taking differently: Take 1 mg by mouth daily.  02/04/20  Yes Joanna Jack, MD  indapamide (LOZOL) 1.25 MG tablet Take 1 tablet by mouth every morning 12/11/19  Yes [name], Joanna Snare, MD  levothyroxine (SYNTHROID) 88 MCG tablet Take 1/2 tablet by mouth on Mondays and fridays and 1 tablet on all other days Patient taking differently: Take 44-88 mcg by mouth See admin instructions. Take  1/2 tablet by mouth on Mondays and fridays and 1 tablet on all other days 10/03/19  Yes [name], Joanna A, MD  liraglutide (VICTOZA) 18 MG/3ML SOPN Inject 1.2mg  into the skin daily Patient taking differently: Inject 1.2 mg into the skin at bedtime.  03/25/19  Yes Joanna Drown, MD  losartan (COZAAR) 100 MG tablet Take 1 tablet (100 mg total) by mouth daily. 11/17/19  Yes Joanna Drown, MD  Magnesium 250 MG TABS Take 250 mg by mouth daily.    Yes [provider]  omeprazole  (PRILOSEC) 40 MG capsule Take 1 capsule (40 mg total) by mouth daily. 11/07/19  Yes [name], Joanna Snare, MD  potassium chloride (KLOR-CON) 10 MEQ tablet Take one tablet po in the morning and two  po in the evening. Patient taking differently: Take 10-20 mEq by mouth See admin instructions. Take one tablet (20 meq) by mouth in the morning and two (40 meq)  By mouth in the evening. 01/30/20  Yes Joanna Drown, MD  rosuvastatin (CRESTOR) 20 MG tablet Take one tablet po each day Patient taking differently: Take 20 mg by mouth daily. Take one tablet po each day 11/19/19  Yes [name], Joanna A, MD  Simethicone (GAS-X PO) Take 1 capsule by mouth as needed (gas/bloating.).    Yes [provider]  tiZANidine (ZANAFLEX) 2 MG tablet 1 tablet twice daily for 2 weeks and then take 1 tablet 3 times daily Patient taking differently: Take 2 mg by mouth every 8 (eight) hours as needed for muscle spasms.  11/17/19  Yes Joanna Ducking, MD  topiramate (TOPAMAX) 50 MG tablet Take 1 tablet by mouth twice [name] day Patient taking differently: Take 50 mg by mouth 2 (two) times daily.  12/08/19  Yes [name], Joanna Snare, MD  albuterol (PROVENTIL HFA;VENTOLIN HFA) 108 (90 Base) MCG/ACT inhaler Inhale 2 puffs into the lungs every 4 (four) hours as needed for wheezing or shortness of breath. 12/25/18   Joanna Drown, MD  colchicine 0.6 MG tablet 1 bid prn gout Patient taking differently: Take 0.6 mg by mouth 2 (two) times daily as needed (gout). 1 bid prn gout 03/12/19   Joanna Drown, MD  Lancets [facility] DELICA PLUS [id number]) [place] TEST ONCE DAILY 07/25/19   Joanna Drown, MD  [facility] VERIO test strip TEST ONCE DAILY 05/01/19   Joanna Drown, MD    Allergies as of 02/09/2020 - Review Complete 02/06/2020  Allergen Reaction Noted  . Ivp dye [iodinated diagnostic agents]  06/04/2013  . Zoloft [sertraline hcl]  05/21/2017    Family History  Problem Relation Age of Onset  . Cardiomyopathy Father   . Heart failure Mother    . Hyperlipidemia Mother   . Stroke Brother   . Kidney cancer Brother   . Diabetes Paternal Uncle   . Heart disease Paternal Grandmother   . Colon cancer Neg Hx   . Colon polyps Neg Hx     Social History   Socioeconomic History  . Marital status: Divorced    Spouse name: Not on file  . Number of children: 2  . Years of education: Not on file  . Highest education level: Not on file  Occupational History  . Occupation: retired    Comment: 2014 or so  Tobacco Use  . Smoking status: Current Every Day Smoker    Packs/day: 0.25    Years: 30.00    Pack years: 7.50    Types: Cigarettes  Last attempt to quit: 10/16/2010    Years since quitting: 9.3  . Smokeless tobacco: Former Systems developer    Quit date: 10/28/2010  Substance and Sexual Activity  . Alcohol use: Yes    Comment: socially, maybe 3 times [name] year   . Drug use: No  . Sexual activity: Not on file  Other Topics Concern  . Not on file  Social History Narrative   Right handed    Lives alone    Caffeine- 5-6 cups per day    Social Determinants of Health   Financial Resource Strain:   . Difficulty of Paying Living Expenses:   Food Insecurity:   . Worried About Charity fundraiser in the Last Year:   . Arboriculturist in the Last Year:   Transportation Needs:   . Film/video editor (Medical):   Marland Kitchen Lack of Transportation (Non-Medical):   Physical Activity:   . Days of Exercise per Week:   . Minutes of Exercise per Session:   Stress:   . Feeling of Stress :   Social Connections:   . Frequency of Communication with Friends and Family:   . Frequency of Social Gatherings with Friends and Family:   . Attends Religious Services:   . Active Member of Clubs or Organizations:   . Attends Archivist Meetings:   Marland Kitchen Marital Status:   Intimate Partner Violence:   . Fear of Current or Ex-Partner:   . Emotionally Abused:   Marland Kitchen Physically Abused:   . Sexually Abused:     Review of Systems: See HPI, otherwise negative  ROS   Physical Exam: BP (!) 144/62   Pulse 94 Comment: pvc's  Temp 98.3 F (36.8 C) (Oral)   Resp (!) 22   Ht 5\' 2"  (1.575 m)   SpO2 99%   BMI 28.86 kg/m  General:   Alert,  pleasant and cooperative in NAD Head:  Normocephalic and atraumatic. Neck:  Supple; Lungs:  Clear throughout to auscultation.    Heart:  Regular rate and rhythm. Abdomen:  Soft, nontender and nondistended. Normal bowel sounds, without guarding, and without rebound.   Neurologic:  Alert and  oriented x4;  grossly normal neurologically.  Impression/Plan:     Abnormal CT scan JAN 2021: some mural thickening, particularly along the greater curvature of the stomach in the region of the fundus and body.  Plan:  EGD TODAY.  DISCUSSED PROCEDURE, BENEFITS, & RISKS: < 1% chance of medication reaction, bleeding, perforation, or ASPIRATION.

## 2020-02-19 NOTE — Telephone Encounter (Signed)
Not sure what supplies are being requested so I called medical supplier and noone answered so I called pt and she states she just left hospital from having endoscopy and she does not remember ordering anything. She states to find out what it was and let her know. We did get a fax from them for freestyle libre and it was faxed back with diagnosis code and insurance info today but pt wanted to know what they requested because she does not remember ordering anything and asked to be called back tomorrow since just leaving hospital from getting a procedure done.

## 2020-02-19 NOTE — Discharge Instructions (Signed)
You have mild gastritis. YOUR SMALL BOWEL LOOKED NORMAL. I biopsied your stomach.   DRINK WATER TO KEEP YOUR URINE LIGHT YELLOW.  FOLLOW A LOW FAT DIET. MEATS SHOULD BE BAKED, BROILED, OR BOILED. AVOID FRIED FOODS. SEE INFO BELOW.  CONTINUE OMEPRAZOLE.  TAKE 30 MINUTES PRIOR TO YOUR FIRST MEAL EVERY DAY.   YOUR BIOPSY RESULTS WILL BE BACK IN 5 BUSINESS DAYS.  FOLLOW UP IN 6 MOS.  UPPER ENDOSCOPY AFTER CARE Read the instructions outlined below and refer to this sheet in the next week. These discharge instructions provide you with general information on caring for yourself after you leave the hospital. While your treatment has been planned according to the most current medical practices available, unavoidable complications occasionally occur. If you have any problems or questions after discharge, call DR. Tyresse Jayson, [phone].  ACTIVITY  You may resume your regular activity, but move at a slower pace for the next 24 hours.   Take frequent rest periods for the next 24 hours.   Walking will help get rid of the air and reduce the bloated feeling in your belly (abdomen).   No driving for 24 hours (because of the medicine (anesthesia) used during the test).   You may shower.   Do not sign any important legal documents or operate any machinery for 24 hours (because of the anesthesia used during the test).    NUTRITION  Drink plenty of fluids.   You may resume your normal diet as instructed by your doctor.   Begin with a light meal and progress to your normal diet. Heavy or fried foods are harder to digest and may make you feel sick to your stomach (nauseated).   Avoid alcoholic beverages for 24 hours or as instructed.    MEDICATIONS  You may resume your normal medications.   WHAT YOU CAN EXPECT TODAY  Some feelings of bloating in the abdomen.   Passage of more gas than usual.    IF YOU HAD A BIOPSY TAKEN DURING THE UPPER ENDOSCOPY:  Eat a soft diet IF YOU HAVE  NAUSEA, BLOATING, ABDOMINAL PAIN, OR VOMITING.    FINDING OUT THE RESULTS OF YOUR TEST Not all test results are available during your visit. DR. Oneida Alar WILL CALL YOU WITHIN 14 DAYS OF YOUR PROCEDUE WITH YOUR RESULTS. Do not assume everything is normal if you have not heard from DR. Megin Consalvo, CALL HER OFFICE AT [phone].  SEEK IMMEDIATE MEDICAL ATTENTION AND CALL THE OFFICE: [phone] IF:  You have more than a spotting of blood in your stool.   Your belly is swollen (abdominal distention).   You are nauseated or vomiting.   You have a temperature over 101F.   You have abdominal pain or discomfort that is severe or gets worse throughout the day.   Gastritis  Gastritis is an inflammation (the body's way of reacting to injury and/or infection) of the stomach. It is often caused by viral or bacterial (germ) infections. It can also be caused BY ASPIRIN, BC/GOODY POWDER'S, (IBUPROFEN) MOTRIN, OR ALEVE (NAPROXEN), chemicals (including alcohol), SPICY FOODS, and medications. This illness may be associated with generalized malaise (feeling tired, not well), UPPER ABDOMINAL STOMACH cramps, and fever. One common bacterial cause of gastritis is an organism known as H. Pylori. This can be treated with antibiotics.

## 2020-02-19 NOTE — Progress Notes (Signed)
RT placed patient on CPAP 8cm H2O with 2L O2 bled in for endoscopy procedure.

## 2020-02-19 NOTE — Op Note (Signed)
[facility] Patient Name: Joanna [name] Procedure Date: 02/19/2020 1:03 PM MRN: [id number] Date of Birth: Aug 23, 1951 Attending MD: Barney Drain MD, MD CSN: [id number] Age: 69 Admit Type: Outpatient Procedure:                Upper GI endoscopy WITH COLD FORCEPS BIOPSY Indications:              Abnormal CT of the GI tract Providers:                Barney Drain MD, MD, Lurline Del, RN, Nelma Rothman,                            Technician Referring MD:             [name]. [name] Medicines:                Meperidine 50 mg IV, Midazolam 3 mg IV Complications:            No immediate complications. Estimated Blood Loss:     Estimated blood loss was minimal. Procedure:                Pre-Anesthesia Assessment:                           - Prior to the procedure, a History and Physical                            was performed, and patient medications and                            allergies were reviewed. The patient's tolerance of                            previous anesthesia was also reviewed. The risks                            and benefits of the procedure and the sedation                            options and risks were discussed with the patient.                            All questions were answered, and informed consent                            was obtained. Prior Anticoagulants: The patient has                            taken no previous anticoagulant or antiplatelet                            agents. ASA Grade Assessment: II - A patient with                            mild systemic disease. After reviewing the risks  and benefits, the patient was deemed in                            satisfactory condition to undergo the procedure.                            After obtaining informed consent, the endoscope was                            passed under direct vision. Throughout the                            procedure, the patient's blood pressure, pulse, and                             oxygen saturations were monitored continuously. The                            [id number]-H190 ([id number]) scope was introduced through the                            mouth, and advanced to the duodenal bulb. The upper                            GI endoscopy was accomplished without difficulty.                            The patient tolerated the procedure well. Scope In: 1:23:40 PM Scope Out: 1:32:16 PM Total Procedure Duration: 0 hours 8 minutes 36 seconds  Findings:      The examined esophagus was normal.      Diffuse moderately congested mucosa was found in the cardia, in the       gastric fundus and in the gastric body. Biopsies(IN CARDIA, FUNDUS,       BODY, AND ANTRUM) were taken with a cold forceps for histology.      The examined duodenum was normal. Impression:               - Normal esophagus.                           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 Biopsied. Moderate Sedation:      Moderate (conscious) sedation was administered by the endoscopy nurse       and supervised by the endoscopist. The following parameters were       monitored: oxygen saturation, heart rate, blood pressure, and response       to care. Total physician intraservice time was 12 minutes. Recommendation:           - Patient has a contact number available for                            emergencies. The signs and symptoms of potential  delayed complications were discussed with the                            patient. Return to normal activities tomorrow.                            Written discharge instructions were provided to the                            patient.                           - Resume previous diet.                           - Continue present medications.                           - Await pathology results.                           - Return to GI clinic in 6 months. Procedure Code(s):         --- Professional ---                           [phone], Esophagogastroduodenoscopy, flexible,                            transoral; with biopsy, single or multiple                           G0500, Moderate sedation services provided by the                            same physician or other qualified health care                            professional performing a gastrointestinal                            endoscopic service that sedation supports,                            requiring the presence of an independent trained                            observer to assist in the monitoring of the                            patient's level of consciousness and physiological                            status; initial 15 minutes of intra-service time;                            patient age 4 years or older (additional  time may                            be reported with [phone], as appropriate) Diagnosis Code(s):        --- Professional ---                           K31.89, Other diseases of stomach and duodenum                           R93.3, Abnormal findings on diagnostic imaging of                            other parts of digestive tract CPT copyright 2019 American Medical Association. All rights reserved. The codes documented in this report are preliminary and upon coder review may  be revised to meet current compliance requirements. Barney Drain, MD Barney Drain MD, MD 02/19/2020 1:52:00 PM This report has been signed electronically. Number of Addenda: 0

## 2020-02-23 ENCOUNTER — Other Ambulatory Visit: Payer: Self-pay

## 2020-02-23 LAB — SURGICAL PATHOLOGY

## 2020-02-23 NOTE — Telephone Encounter (Signed)
Patient notified and does remember and want the freestyle libre.

## 2020-02-24 ENCOUNTER — Other Ambulatory Visit: Payer: Self-pay

## 2020-02-24 MED ORDER — LEVOTHYROXINE SODIUM 88 MCG PO TABS: ORAL_TABLET | ORAL | 1 refills | Status: DC

## 2020-03-03 ENCOUNTER — Other Ambulatory Visit: Payer: Self-pay

## 2020-03-03 ENCOUNTER — Inpatient Hospital Stay (HOSPITAL_COMMUNITY): Payer: PPO | Attending: Hematology

## 2020-03-03 DIAGNOSIS — Z9079 Acquired absence of other genital organ(s): Secondary | ICD-10-CM | POA: Diagnosis not present

## 2020-03-03 DIAGNOSIS — D509 Iron deficiency anemia, unspecified: Secondary | ICD-10-CM | POA: Diagnosis not present

## 2020-03-03 DIAGNOSIS — E039 Hypothyroidism, unspecified: Secondary | ICD-10-CM | POA: Insufficient documentation

## 2020-03-03 DIAGNOSIS — E876 Hypokalemia: Secondary | ICD-10-CM | POA: Insufficient documentation

## 2020-03-03 DIAGNOSIS — I10 Essential (primary) hypertension: Secondary | ICD-10-CM | POA: Insufficient documentation

## 2020-03-03 DIAGNOSIS — Z8349 Family history of other endocrine, nutritional and metabolic diseases: Secondary | ICD-10-CM | POA: Insufficient documentation

## 2020-03-03 DIAGNOSIS — Z90722 Acquired absence of ovaries, bilateral: Secondary | ICD-10-CM | POA: Insufficient documentation

## 2020-03-03 DIAGNOSIS — F17210 Nicotine dependence, cigarettes, uncomplicated: Secondary | ICD-10-CM | POA: Diagnosis not present

## 2020-03-03 DIAGNOSIS — E119 Type 2 diabetes mellitus without complications: Secondary | ICD-10-CM | POA: Insufficient documentation

## 2020-03-03 DIAGNOSIS — Z79899 Other long term (current) drug therapy: Secondary | ICD-10-CM | POA: Diagnosis not present

## 2020-03-03 DIAGNOSIS — E785 Hyperlipidemia, unspecified: Secondary | ICD-10-CM | POA: Diagnosis not present

## 2020-03-03 DIAGNOSIS — Z8249 Family history of ischemic heart disease and other diseases of the circulatory system: Secondary | ICD-10-CM | POA: Insufficient documentation

## 2020-03-03 DIAGNOSIS — K5900 Constipation, unspecified: Secondary | ICD-10-CM | POA: Insufficient documentation

## 2020-03-03 DIAGNOSIS — G4700 Insomnia, unspecified: Secondary | ICD-10-CM | POA: Diagnosis not present

## 2020-03-03 DIAGNOSIS — Z833 Family history of diabetes mellitus: Secondary | ICD-10-CM | POA: Insufficient documentation

## 2020-03-03 DIAGNOSIS — Z90710 Acquired absence of both cervix and uterus: Secondary | ICD-10-CM | POA: Diagnosis not present

## 2020-03-03 DIAGNOSIS — N189 Chronic kidney disease, unspecified: Secondary | ICD-10-CM | POA: Insufficient documentation

## 2020-03-03 DIAGNOSIS — G4730 Sleep apnea, unspecified: Secondary | ICD-10-CM | POA: Insufficient documentation

## 2020-03-03 LAB — CBC WITH DIFFERENTIAL/PLATELET
Abs Immature Granulocytes: 0.04 10*3/uL (ref 0.00–0.07)
Basophils Absolute: 0.1 10*3/uL (ref 0.0–0.1)
Basophils Relative: 1 %
Eosinophils Absolute: 0.1 10*3/uL (ref 0.0–0.5)
Eosinophils Relative: 2 %
HCT: 34.2 % — ABNORMAL LOW (ref 36.0–46.0)
Hemoglobin: 11 g/dL — ABNORMAL LOW (ref 12.0–15.0)
Immature Granulocytes: 1 %
Lymphocytes Relative: 34 %
Lymphs Abs: 2.2 10*3/uL (ref 0.7–4.0)
MCH: 29 pg (ref 26.0–34.0)
MCHC: 32.2 g/dL (ref 30.0–36.0)
MCV: 90.2 fL (ref 80.0–100.0)
Monocytes Absolute: 0.4 10*3/uL (ref 0.1–1.0)
Monocytes Relative: 7 %
Neutro Abs: 3.6 10*3/uL (ref 1.7–7.7)
Neutrophils Relative %: 55 %
Platelets: 286 10*3/uL (ref 150–400)
RBC: 3.79 MIL/uL — ABNORMAL LOW (ref 3.87–5.11)
RDW: 13.2 % (ref 11.5–15.5)
WBC: 6.5 10*3/uL (ref 4.0–10.5)
nRBC: 0 % (ref 0.0–0.2)

## 2020-03-03 LAB — COMPREHENSIVE METABOLIC PANEL
ALT: 18 U/L (ref 0–44)
AST: 16 U/L (ref 15–41)
Albumin: 3.8 g/dL (ref 3.5–5.0)
Alkaline Phosphatase: 81 U/L (ref 38–126)
Anion gap: 12 (ref 5–15)
BUN: 25 mg/dL — ABNORMAL HIGH (ref 8–23)
CO2: 24 mmol/L (ref 22–32)
Calcium: 9.8 mg/dL (ref 8.9–10.3)
Chloride: 107 mmol/L (ref 98–111)
Creatinine, Ser: 1.66 mg/dL — ABNORMAL HIGH (ref 0.44–1.00)
GFR calc Af Amer: 36 mL/min — ABNORMAL LOW (ref >60)
GFR calc non Af Amer: 31 mL/min — ABNORMAL LOW (ref >60)
Glucose, Bld: 98 mg/dL (ref 70–99)
Potassium: 2.8 mmol/L — ABNORMAL LOW (ref 3.5–5.1)
Sodium: 143 mmol/L (ref 135–145)
Total Bilirubin: 0.4 mg/dL (ref 0.3–1.2)
Total Protein: 6.9 g/dL (ref 6.5–8.1)

## 2020-03-03 LAB — VITAMIN B12: Vitamin B-12: 997 pg/mL — ABNORMAL HIGH (ref 180–914)

## 2020-03-03 LAB — FOLATE: Folate: 92.6 ng/mL (ref >5.9)

## 2020-03-03 LAB — IRON AND TIBC
Iron: 82 ug/dL (ref 28–170)
Saturation Ratios: 32 % — ABNORMAL HIGH (ref 10.4–31.8)
TIBC: 254 ug/dL (ref 250–450)
UIBC: 172 ug/dL

## 2020-03-03 LAB — FERRITIN: Ferritin: 556 ng/mL — ABNORMAL HIGH (ref 11–307)

## 2020-03-10 ENCOUNTER — Inpatient Hospital Stay (HOSPITAL_BASED_OUTPATIENT_CLINIC_OR_DEPARTMENT_OTHER): Payer: PPO | Admitting: Nurse Practitioner

## 2020-03-10 ENCOUNTER — Encounter (HOSPITAL_COMMUNITY): Payer: Self-pay | Admitting: Nurse Practitioner

## 2020-03-10 ENCOUNTER — Other Ambulatory Visit: Payer: Self-pay

## 2020-03-10 DIAGNOSIS — D509 Iron deficiency anemia, unspecified: Secondary | ICD-10-CM | POA: Diagnosis not present

## 2020-03-10 NOTE — Patient Instructions (Addendum)
[place] at [facility]  Discharge Instructions: Follow up in 4 months with labs   You saw Francene Finders, NP, today. _______________________________________________________________  Thank you for choosing [place] at [facility] to provide your oncology and hematology care.  To afford each patient quality time with our providers, please arrive at least 15 minutes before your scheduled appointment.  You need to re-schedule your appointment if you arrive 10 or more minutes late.  We strive to give you quality time with our providers, and arriving late affects you and other patients whose appointments are after yours.  Also, if you no show three or more times for appointments you may be dismissed from the clinic.  Again, thank you for choosing [place] [facility] [place] hope is that these requests will allow you access to exceptional care and in a timely manner. _______________________________________________________________  If you have questions after your visit, please contact our office at [phone] between the hours of 8:30 a.m. and 5:00 p.m. Voicemails left after 4:30 p.m. will not be returned until the following business day. _______________________________________________________________  For prescription refill requests, have your pharmacy contact our office. _______________________________________________________________  Recommendations made by the consultant and any test results will be sent to your referring physician. _______________________________________________________________

## 2020-03-10 NOTE — Progress Notes (Signed)
[place], [place] [address]   CLINIC:  Medical Oncology/Hematology  PCP:  [name], MD [address] [place] [address] [phone]   REASON FOR VISIT: Follow-up for normocytic anemia   CURRENT THERAPY: Intermittent iron infusions   INTERVAL HISTORY:  Ms. Oetken 69 y.o. female returns for routine follow-up for normocytic anemia.  She has been doing well since her last visit.  She denies any bright bleeding per rectum or melena.  She denies easy bruising or bleeding. Denies any nausea, vomiting, or diarrhea. Denies any new pains. Had not noticed any recent bleeding such as epistaxis, hematuria or hematochezia. Denies recent chest pain on exertion, shortness of breath on minimal exertion, pre-syncopal episodes, or palpitations. Denies any numbness or tingling in hands or feet. Denies any recent fevers, infections, or recent hospitalizations. Patient reports appetite at 100% and energy level at 25%.  She is eating well maintain her weight is time.    REVIEW OF SYSTEMS:  Review of Systems  Neurological: Positive for headaches.  Psychiatric/Behavioral: Positive for sleep disturbance.  All other systems reviewed and are negative.    PAST MEDICAL/SURGICAL HISTORY:  Past Medical History:  Diagnosis Date  . 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 Bilateral headaches   . Depression   . Diabetes mellitus   . Diverticulosis   . Dyslipidemia   . GERD (gastroesophageal reflux disease)   . Hypertension   . Insomnia   . Microproteinuria   . Renal insufficiency 2010  . Right carpal tunnel syndrome 12/24/2017  . Sleep apnea   . Sleep apnea 12/25/2017   Abnormal sleep study February 2019 CPAP ordered  . Thyroid disease    hypothyroid   Past Surgical History:  Procedure Laterality Date  . ABDOMINAL HYSTERECTOMY  1984  . BACK SURGERY    . BILATERAL OOPHORECTOMY    . BIOPSY  02/19/2020   Procedure: BIOPSY;  Surgeon: [name], MD;  [facility]: [facility];  Service: Endoscopy;;  gastric  . CERVICAL DISC SURGERY  01/16/2019  . COLONOSCOPY  01/29/12   Fields-2-3 hyperplastic polyps, mod internal hemorrhoids, diverticulosis throughout colon  . COLONOSCOPY N/A 07/03/2014   Dr. Oneida Alar: Hyperplastic polyps  . COLONOSCOPY N/A 06/06/2019   Dr. Oneida Alar: Diverticulosis, hemorrhoids, single polyp removed, pathology revealed lipoma.  Next colonoscopy 10 years  . ESOPHAGOGASTRODUODENOSCOPY   01/29/2012   Fields-2-3cm sliding HH, fundic gland polyps, NEGATIVE H pylori  . ESOPHAGOGASTRODUODENOSCOPY N/A 07/03/2014   Dr. Oneida Alar: gastritis and normal small bowel biopsies  . ESOPHAGOGASTRODUODENOSCOPY N/A 02/19/2020   Procedure: ESOPHAGOGASTRODUODENOSCOPY (EGD);  Surgeon: [name], MD;  [facility];  Service: Endoscopy;  Laterality: N/A;  12:00pm  . GIVENS CAPSULE STUDY N/A 10/26/2014   SMALL BOWEL AVMs  . KNEE ARTHROSCOPY Right   . POLYPECTOMY  06/06/2019   Procedure: POLYPECTOMY;  Surgeon: [name], MD;  [facility];  Service: Endoscopy;;  . TOTAL ABDOMINAL HYSTERECTOMY W/ BILATERAL SALPINGOOPHORECTOMY       SOCIAL HISTORY:  Social History   Socioeconomic History  . Marital status: Divorced    Spouse name: Not on file  . Number of children: 2  . Years of education: Not on file  . Highest education level: Not on file  Occupational History  . Occupation: retired    Comment: 2014 or so  Tobacco Use  . Smoking status: Current Every Day Smoker    Packs/day:  0.25    Years: 30.00    Pack years: 7.50    Types: Cigarettes    Last attempt to quit: 10/16/2010    Years since quitting: 9.4  . Smokeless tobacco: Former Systems developer    Quit date: 10/28/2010  Substance and Sexual Activity  . Alcohol use: Yes    Comment: socially, maybe 3 times a year   . Drug use: No  . Sexual activity: Not on file  Other Topics Concern  . Not on file  Social History  Narrative   Right handed    Lives alone    Caffeine- 5-6 cups per day    Social Determinants of Health   Financial Resource Strain:   . Difficulty of Paying Living Expenses:   Food Insecurity:   . Worried About Charity fundraiser in the Last Year:   . Arboriculturist in the Last Year:   Transportation Needs:   . Film/video editor (Medical):   Marland Kitchen Lack of Transportation (Non-Medical):   Physical Activity:   . Days of Exercise per Week:   . Minutes of Exercise per Session:   Stress:   . Feeling of Stress :   Social Connections:   . Frequency of Communication with Friends and Family:   . Frequency of Social Gatherings with Friends and Family:   . Attends Religious Services:   . Active Member of Clubs or Organizations:   . Attends Archivist Meetings:   Marland Kitchen Marital Status:   Intimate Partner Violence:   . Fear of Current or Ex-Partner:   . Emotionally Abused:   Marland Kitchen Physically Abused:   . Sexually Abused:     FAMILY HISTORY:  Family History  Problem Relation Age of Onset  . Cardiomyopathy Father   . Heart failure Mother   . Hyperlipidemia Mother   . Stroke Brother   . Kidney cancer Brother   . Diabetes Paternal Uncle   . Heart disease Paternal Grandmother   . Colon cancer Neg Hx   . Colon polyps Neg Hx     CURRENT MEDICATIONS:  Outpatient Encounter Medications as of 03/10/2020  Medication Sig Note  . Melatonin 3 MG CAPS Take by mouth at bedtime as needed.   Marland Kitchen acetaminophen (TYLENOL) 500 MG tablet Take 1,000 mg by mouth every 8 (eight) hours as needed for moderate pain.   Marland Kitchen albuterol (PROVENTIL HFA;VENTOLIN HFA) 108 (90 Base) MCG/ACT inhaler Inhale 2 puffs into the lungs every 4 (four) hours as needed for wheezing or shortness of breath.   . allopurinol (ZYLOPRIM) 100 MG tablet Take one tablet by mouth daily (Patient taking differently: Take 100 mg by mouth daily. Take one tablet by mouth daily)   . amLODipine (NORVASC) 5 MG tablet Take 1 tablet (5 mg total)  by mouth daily.   . colchicine 0.6 MG tablet 1 bid prn gout (Patient taking differently: Take 0.6 mg by mouth 2 (two) times daily as needed (gout). 1 bid prn gout)   . Cyanocobalamin (VITAMIN B-12 PO) Take 1 tablet by mouth daily.   . ferrous sulfate 325 (65 FE) MG tablet Take 325 mg by mouth daily with breakfast.   . fexofenadine (ALLEGRA) 180 MG tablet Take 180 mg by mouth daily.   . fluticasone (FLONASE) 50 MCG/ACT nasal spray PLACE 2 SPRAYS INTO THE NOSE AS NEEDED. (Patient taking differently: Place 2 sprays into both nostrils daily as needed for allergies. ) 01/31/4080: otc  . folic acid (FOLVITE) 1 MG tablet TAKE  1 TABLET(1 MG) BY MOUTH DAILY (Patient taking differently: Take 1 mg by mouth daily. )   . indapamide (LOZOL) 1.25 MG tablet Take 1 tablet by mouth every morning   . Lancets (ONETOUCH DELICA PLUS [id number]) MISC TEST ONCE DAILY   . levothyroxine (SYNTHROID) 88 MCG tablet Take 1/2 tablet by mouth on Mondays and fridays and 1 tablet on all other days   . liraglutide (VICTOZA) 18 MG/3ML SOPN Inject 1.2mg  into the skin daily (Patient taking differently: Inject 1.2 mg into the skin at bedtime. )   . losartan (COZAAR) 100 MG tablet Take 1 tablet (100 mg total) by mouth daily.   . Magnesium 250 MG TABS Take 250 mg by mouth daily.    Marland Kitchen omeprazole (PRILOSEC) 40 MG capsule Take 1 capsule (40 mg total) by mouth daily.   Glory Rosebush VERIO test strip TEST ONCE DAILY   . potassium chloride (KLOR-CON) 10 MEQ tablet Take one tablet po in the morning and two  po in the evening. (Patient taking differently: Take 10-20 mEq by mouth See admin instructions. Take one tablet (20 meq) by mouth in the morning and two (40 meq)  By mouth in the evening.)   . rosuvastatin (CRESTOR) 20 MG tablet Take one tablet po each day (Patient taking differently: Take 20 mg by mouth daily. Take one tablet po each day)   . Simethicone (GAS-X PO) Take 1 capsule by mouth as needed (gas/bloating.).    Marland Kitchen tiZANidine (ZANAFLEX) 2 MG  tablet 1 tablet twice daily for 2 weeks and then take 1 tablet 3 times daily (Patient taking differently: Take 2 mg by mouth every 8 (eight) hours as needed for muscle spasms. )   . topiramate (TOPAMAX) 50 MG tablet Take 1 tablet by mouth twice a day (Patient taking differently: Take 50 mg by mouth 2 (two) times daily. )    No facility-administered encounter medications on file as of 03/10/2020.    ALLERGIES:  Allergies  Allergen Reactions  . Ivp Dye [Iodinated Diagnostic Agents]   . Zoloft [Sertraline Hcl]     Withdrawn,bad dreams     PHYSICAL EXAM:  ECOG Performance status: 1  Vitals:   03/10/20 1352  BP: 124/86  Pulse: (!) 106  Resp: 16  Temp: (!) 97.1 F (36.2 C)  SpO2: 98%   Filed Weights   03/10/20 1352  Weight: 155 lb 9.6 oz (70.6 kg)      Physical Exam Constitutional:      Appearance: Normal appearance. She is normal weight.  Cardiovascular:     Rate and Rhythm: Normal rate and regular rhythm.     Heart sounds: Normal heart sounds.  Pulmonary:     Effort: Pulmonary effort is normal.     Breath sounds: Normal breath sounds.  Abdominal:     General: Bowel sounds are normal.     Palpations: Abdomen is soft.  Musculoskeletal:        General: Normal range of motion.  Skin:    General: Skin is warm.  Neurological:     Mental Status: She is alert and oriented to person, place, and time. Mental status is at baseline.  Psychiatric:        Mood and Affect: Mood normal.        Behavior: Behavior normal.        Thought Content: Thought content normal.        Judgment: Judgment normal.      LABORATORY DATA:  I have reviewed the labs  as listed.  CBC    Component Value Date/Time   WBC 6.5 03/03/2020 1019   RBC 3.79 (L) 03/03/2020 1019   HGB 11.0 (L) 03/03/2020 1019   HGB 10.1 (L) 11/13/2018 1205   HCT 34.2 (L) 03/03/2020 1019   HCT 32.5 (L) 11/13/2018 1205   PLT 286 03/03/2020 1019   PLT 366 11/13/2018 1205   MCV 90.2 03/03/2020 1019   MCV 80  11/13/2018 1205   MCH 29.0 03/03/2020 1019   MCHC 32.2 03/03/2020 1019   RDW 13.2 03/03/2020 1019   RDW 16.5 (H) 11/13/2018 1205   LYMPHSABS 2.2 03/03/2020 1019   LYMPHSABS 2.4 11/13/2018 1205   MONOABS 0.4 03/03/2020 1019   EOSABS 0.1 03/03/2020 1019   EOSABS 0.1 11/13/2018 1205   BASOSABS 0.1 03/03/2020 1019   BASOSABS 0.1 11/13/2018 1205   CMP Latest Ref Rng & Units 03/03/2020 01/27/2020 10/27/2019  Glucose 70 - 99 mg/dL 98 91 101(H)  BUN 8 - 23 mg/dL 25(H) 17 20  Creatinine 0.44 - 1.00 mg/dL 1.66(H) 1.71(H) 1.59(H)  Sodium 135 - 145 mmol/L 143 147(H) 145  Potassium 3.5 - 5.1 mmol/L 2.8(L) 3.2(L) 3.2(L)  Chloride 98 - 111 mmol/L 107 107(H) 110  CO2 22 - 32 mmol/L 24 23 25   Calcium 8.9 - 10.3 mg/dL 9.8 10.4(H) 10.5(H)  Total Protein 6.5 - 8.1 g/dL 6.9 6.5 7.0  Total Bilirubin 0.3 - 1.2 mg/dL 0.4 0.2 0.4  Alkaline Phos 38 - 126 U/L 81 107 79  AST 15 - 41 U/L 16 17 15   ALT 0 - 44 U/L 18 11 14     All questions were answered to patient's stated satisfaction. Encouraged patient to call with any new concerns or questions before his next visit to the cancer center and we can certain see him sooner, if needed.     ASSESSMENT & PLAN:  Microcytic anemia 1.  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 -Her last Feraheme infusion was on 11/17/2019 and 12/08/2019 -Patient reports ice pica.  She denies any bleeding per rectum or melena. -She cannot tolerate oral iron because of constipation. -We talked about Procrit injections on her last visit. -Labs today on 03/03/2020 showed her hemoglobin 11.9, ferritin 556, percent saturation 32 -She does not need any IV iron at this time. - She will follow-up in 4 months with repeat labs.  2.  Hypokalemia: - Patient reports she has had problems with her potassium being low for months.  She is being followed by her PCP for this. -Her PCP recently prescribed her 20 mEq of potassium to  take daily.  However she has not been taking this as prescribed. -Labs on 03/03/2020 showed her potassium 2.8.  She reports she did not take her potassium prior to these labs.  She reports her PCP has increased her dose to 2 a day.  She reports she is still not taking the pills as prescribed. -We told her to take 2 pills when she got home today. -She did not want her labs rechecked today.  3.  Smoking history: -She is a current active smoker, smokes half a pack per day for 50 years. -CT CAP from 11/07/2019.  Showed new 4 mm right lower lobe lung nodule is nonspecific but likely benign in origin.  Noncontrast CT chest is recommended in 12 months.  Suggested thickening of the greater curvature of the stomach in the region of the fundus and body, exaggerated by under  distention of the stomach.  4.  Health maintenance: -Mammogram was on 03/17/2019 was B RADS category 1 - Her last colonoscopy was 06/06/2019 which showed the terminal ileum appeared normal.  A 6 mm polyp was found in the rectum.  The polyp was sessile.  The polyp was removed with a hot snare.  Multiple small and large mouth diverticula were found in the entire colon.  External and internal hemorrhoids were found.  The hemorrhoids were moderate.  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  Follow-up colonoscopy in 10 years  5.  Unexplained weight loss: - Patient has lost 47 pounds in less than a year. -She reports she is not have an appetite. -We ordered a CT scan of the CAP with oral contrast on 01/08/2019 which showed a solitary right lower lobe solid 4 mm pulmonary nodule, mildly increased in size from 2 mm in 2010.  Likely benign nodule due to the slow growth. - We will follow-up with a chest CT in 6 to 12 months from last scan. -CT CAP done on 11/07/2019 showed new 4 mm right lower lobe lung nodule is nonspecific but likely benign.  Showed subjective thickening of the greater curvature the stomach in the region of the fundus and body, exaggerated by  under distention of the stomach.  She had an EGD in 2016 and stomach was normal.  She does not have any abdominal pain or bloating at this time.  She has not had any more weight loss in the past 6 months.  6.  Chronic kidney disease: - Her creatinine has been elevated since 2014. - Labs done on 07/21/2019 showed her creatinine 1.48  7.  Folic acid deficiency: -Folic acid was 4.5. -She is now on folic acid 1 mg daily. -Labs done on 3/74/8270 showed folic acid has increased to 92.6     Orders placed this encounter:  Orders Placed This Encounter  Procedures  . Lactate dehydrogenase  . CBC with Differential/Platelet  . Comprehensive metabolic panel  . Ferritin  . Iron and TIBC  . VITAMIN D 25 Hydroxy (Vit-D Deficiency, Fractures)  . Vitamin B12  . Folate      Francene Finders, FNP-C [place] [phone]

## 2020-03-10 NOTE — Assessment & Plan Note (Addendum)
1.  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 -Her last Feraheme infusion was on 11/17/2019 and 12/08/2019 -Patient reports ice pica.  She denies any bleeding per rectum or melena. -She cannot tolerate oral iron because of constipation. -We talked about Procrit injections on her last visit. -Labs today on 03/03/2020 showed her hemoglobin 11.9, ferritin 556, percent saturation 32 -She does not need any IV iron at this time. - She will follow-up in 4 months with repeat labs.  2.  Hypokalemia: - Patient reports she has had problems with her potassium being low for months.  She is being followed by her PCP for this. -Her PCP recently prescribed her 20 mEq of potassium to take daily.  However she has not been taking this as prescribed. -Labs on 03/03/2020 showed her potassium 2.8.  She reports she did not take her potassium prior to these labs.  She reports her PCP has increased her dose to 2 a day.  She reports she is still not taking the pills as prescribed. -We told her to take 2 pills when she got home today. -She did not want her labs rechecked today.  3.  Smoking history: -She is a current active smoker, smokes half a pack per day for 50 years. -CT CAP from 11/07/2019.  Showed new 4 mm right lower lobe lung nodule is nonspecific but likely benign in origin.  Noncontrast CT chest is recommended in 12 months.  Suggested thickening of the greater curvature of the stomach in the region of the fundus and body, exaggerated by under distention of the stomach.  4.  Health maintenance: -Mammogram was on 03/17/2019 was B RADS category 1 - Her last colonoscopy was 06/06/2019 which showed the terminal ileum appeared normal.  A 6 mm polyp was found in the rectum.  The polyp was sessile.  The polyp was removed with a hot snare.  Multiple small and large mouth diverticula were found in the entire colon.  External and internal hemorrhoids were found.   The hemorrhoids were moderate.  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  Follow-up colonoscopy in 10 years  5.  Unexplained weight loss: - Patient has lost 47 pounds in less than a year. -She reports she is not have an appetite. -We ordered a CT scan of the CAP with oral contrast on 01/08/2019 which showed a solitary right lower lobe solid 4 mm pulmonary nodule, mildly increased in size from 2 mm in 2010.  Likely benign nodule due to the slow growth. - We will follow-up with a chest CT in 6 to 12 months from last scan. -CT CAP done on 11/07/2019 showed new 4 mm right lower lobe lung nodule is nonspecific but likely benign.  Showed subjective thickening of the greater curvature the stomach in the region of the fundus and body, exaggerated by under distention of the stomach.  She had an EGD in 2016 and stomach was normal.  She does not have any abdominal pain or bloating at this time.  She has not had any more weight loss in the past 6 months.  6.  Chronic kidney disease: - Her creatinine has been elevated since 2014. - Labs done on 07/21/2019 showed her creatinine 1.48  7.  Folic acid deficiency: -Folic acid was 4.5. -She is now on folic acid 1 mg daily. -Labs done on 9/73/5329 showed folic acid has increased to 92.6

## 2020-03-11 DIAGNOSIS — N289 Disorder of kidney and ureter, unspecified: Secondary | ICD-10-CM | POA: Diagnosis not present

## 2020-03-12 ENCOUNTER — Other Ambulatory Visit: Payer: Self-pay | Admitting: *Deleted

## 2020-03-12 DIAGNOSIS — N289 Disorder of kidney and ureter, unspecified: Secondary | ICD-10-CM

## 2020-03-12 DIAGNOSIS — I10 Essential (primary) hypertension: Secondary | ICD-10-CM

## 2020-03-12 LAB — BASIC METABOLIC PANEL
BUN/Creatinine Ratio: 15 (ref 12–28)
BUN: 23 mg/dL (ref 8–27)
CO2: 21 mmol/L (ref 20–29)
Calcium: 10.1 mg/dL (ref 8.7–10.3)
Chloride: 107 mmol/L — ABNORMAL HIGH (ref 96–106)
Creatinine, Ser: 1.53 mg/dL — ABNORMAL HIGH (ref 0.57–1.00)
GFR calc Af Amer: 40 mL/min/{1.73_m2} — ABNORMAL LOW (ref >59)
GFR calc non Af Amer: 35 mL/min/{1.73_m2} — ABNORMAL LOW (ref >59)
Glucose: 88 mg/dL (ref 65–99)
Potassium: 3.4 mmol/L — ABNORMAL LOW (ref 3.5–5.2)
Sodium: 144 mmol/L (ref 134–144)

## 2020-03-19 ENCOUNTER — Other Ambulatory Visit: Payer: Self-pay | Admitting: *Deleted

## 2020-03-19 MED ORDER — OMEPRAZOLE 40 MG PO CPDR: 40.0000 mg | DELAYED_RELEASE_CAPSULE | Freq: Every day | ORAL | 0 refills | Status: DC

## 2020-03-23 ENCOUNTER — Other Ambulatory Visit: Payer: Self-pay | Admitting: Family Medicine

## 2020-03-23 NOTE — Telephone Encounter (Signed)
May have 90-day follow-up by August

## 2020-03-24 ENCOUNTER — Other Ambulatory Visit: Payer: Self-pay | Admitting: Neurology

## 2020-03-24 NOTE — Telephone Encounter (Signed)
Scheduled 8/9

## 2020-03-24 NOTE — Telephone Encounter (Signed)
Please schedule follow up in august and then route back to nurses to send in refill

## 2020-03-29 ENCOUNTER — Telehealth: Payer: Self-pay | Admitting: Radiology

## 2020-03-29 ENCOUNTER — Other Ambulatory Visit: Payer: Self-pay | Admitting: Nephrology

## 2020-03-29 ENCOUNTER — Other Ambulatory Visit (HOSPITAL_COMMUNITY): Payer: Self-pay | Admitting: Nephrology

## 2020-03-29 DIAGNOSIS — E785 Hyperlipidemia, unspecified: Secondary | ICD-10-CM | POA: Diagnosis not present

## 2020-03-29 DIAGNOSIS — N189 Chronic kidney disease, unspecified: Secondary | ICD-10-CM | POA: Diagnosis not present

## 2020-03-29 DIAGNOSIS — Z720 Tobacco use: Secondary | ICD-10-CM | POA: Diagnosis not present

## 2020-03-29 DIAGNOSIS — E1122 Type 2 diabetes mellitus with diabetic chronic kidney disease: Secondary | ICD-10-CM | POA: Diagnosis not present

## 2020-03-29 DIAGNOSIS — N2581 Secondary hyperparathyroidism of renal origin: Secondary | ICD-10-CM | POA: Diagnosis not present

## 2020-03-29 DIAGNOSIS — D631 Anemia in chronic kidney disease: Secondary | ICD-10-CM | POA: Diagnosis not present

## 2020-03-29 DIAGNOSIS — R911 Solitary pulmonary nodule: Secondary | ICD-10-CM | POA: Diagnosis not present

## 2020-03-29 DIAGNOSIS — N1832 Chronic kidney disease, stage 3b: Secondary | ICD-10-CM | POA: Diagnosis not present

## 2020-03-29 DIAGNOSIS — I129 Hypertensive chronic kidney disease with stage 1 through stage 4 chronic kidney disease, or unspecified chronic kidney disease: Secondary | ICD-10-CM | POA: Diagnosis not present

## 2020-03-29 DIAGNOSIS — I2510 Atherosclerotic heart disease of native coronary artery without angina pectoris: Secondary | ICD-10-CM | POA: Diagnosis not present

## 2020-04-02 DIAGNOSIS — E876 Hypokalemia: Secondary | ICD-10-CM | POA: Diagnosis not present

## 2020-04-05 ENCOUNTER — Ambulatory Visit (HOSPITAL_COMMUNITY)
Admission: RE | Admit: 2020-04-05 | Discharge: 2020-04-05 | Disposition: A | Discharge status: Home / Self Care | Payer: PPO | Source: Ambulatory Visit | Attending: Nephrology | Admitting: Nephrology

## 2020-04-05 ENCOUNTER — Other Ambulatory Visit: Payer: Self-pay

## 2020-04-05 DIAGNOSIS — N1832 Chronic kidney disease, stage 3b: Secondary | ICD-10-CM | POA: Diagnosis not present

## 2020-04-05 DIAGNOSIS — N183 Chronic kidney disease, stage 3 unspecified: Secondary | ICD-10-CM | POA: Diagnosis not present

## 2020-04-07 ENCOUNTER — Telehealth: Payer: Self-pay | Admitting: Family Medicine

## 2020-04-07 NOTE — Telephone Encounter (Signed)
Nurses see form that was sent to me regarding freestyle Elenor Legato  This is asking for [place] to sign off on this Typically this is indicated for individuals who are on multiple shots of insulin per day Or multiple injections per day  According to my records this patient is on 1 shot a day of [place] allows to monitor her sugars frequently throughout the day but is primarily intended for individuals who are on multiple shots of insulin per day  There is a likelihood that this would not be covered by her insurance. Also there is a fair amount of out-of-pocket cost I can send this form in if the patient is interested But I cannot guarantee it will get approved  Please touch base with patient to see how she would like to proceed

## 2020-04-07 NOTE — Telephone Encounter (Signed)
So noted please return form to me thank you

## 2020-04-07 NOTE — Telephone Encounter (Signed)
Discussed with pt. Pt states she still wants to send in form. She wants the freestyle Joanna [name] mostly because she is unable to use her left arm.

## 2020-04-07 NOTE — Telephone Encounter (Signed)
Form in your basket.

## 2020-04-11 NOTE — Telephone Encounter (Signed)
I filled out the form I am not certain it will be covered but please send that in

## 2020-04-13 ENCOUNTER — Other Ambulatory Visit: Payer: Self-pay | Admitting: Family Medicine

## 2020-04-16 ENCOUNTER — Telehealth: Payer: Self-pay | Admitting: Family Medicine

## 2020-04-16 ENCOUNTER — Other Ambulatory Visit: Payer: Self-pay | Admitting: *Deleted

## 2020-04-16 MED ORDER — TOPIRAMATE 50 MG PO TABS: 50.0000 mg | ORAL_TABLET | Freq: Two times a day (BID) | ORAL | 1 refills | Status: DC

## 2020-04-16 NOTE — Telephone Encounter (Signed)
May have 6 months on refill ?

## 2020-04-16 NOTE — Telephone Encounter (Signed)
Fax from [employer] requesting refill on Topiramate 50 mg tablets. Pt has received their last refill and pharmacy would like script on file for next fill. Please advise. Thank you

## 2020-04-22 ENCOUNTER — Telehealth: Payer: Self-pay | Admitting: Family Medicine

## 2020-04-22 MED ORDER — INDAPAMIDE 1.25 MG PO TABS: 1.2500 mg | ORAL_TABLET | Freq: Every morning | ORAL | 1 refills | Status: DC

## 2020-04-22 MED ORDER — LOSARTAN POTASSIUM 100 MG PO TABS: 100.0000 mg | ORAL_TABLET | Freq: Every day | ORAL | 1 refills | Status: DC

## 2020-04-22 NOTE — Addendum Note (Signed)
Addended by: [name] on: 04/22/2020 01:37 PM   Modules accepted: Orders

## 2020-04-22 NOTE — Telephone Encounter (Signed)
Refills sent to pharmacy. 

## 2020-04-22 NOTE — Telephone Encounter (Signed)
Fax from [facility] requesting refill on Indapamide 1.25 mg tablets; take one tablet every morning and Losartan 100 mg take one tablet daily. Pt last seen 11/19/19; has upcoming appt 05/17/2020. Please advise. Thank you

## 2020-04-22 NOTE — Telephone Encounter (Signed)
That would be fine to do.  °

## 2020-04-26 ENCOUNTER — Other Ambulatory Visit (HOSPITAL_COMMUNITY): Payer: Self-pay | Admitting: Hematology

## 2020-04-29 ENCOUNTER — Other Ambulatory Visit: Payer: Self-pay | Admitting: *Deleted

## 2020-04-29 MED ORDER — OMEPRAZOLE 40 MG PO CPDR: 40.0000 mg | DELAYED_RELEASE_CAPSULE | Freq: Every day | ORAL | 0 refills | Status: DC

## 2020-04-29 MED ORDER — POTASSIUM CHLORIDE ER 10 MEQ PO TBCR: EXTENDED_RELEASE_TABLET | ORAL | 0 refills | Status: DC

## 2020-04-29 MED ORDER — ROSUVASTATIN CALCIUM 20 MG PO TABS: ORAL_TABLET | ORAL | 0 refills | Status: DC

## 2020-04-29 MED ORDER — AMLODIPINE BESYLATE 5 MG PO TABS: 5.0000 mg | ORAL_TABLET | Freq: Every day | ORAL | 0 refills | Status: DC

## 2020-05-17 ENCOUNTER — Encounter: Payer: Self-pay | Admitting: Family Medicine

## 2020-05-17 ENCOUNTER — Ambulatory Visit (INDEPENDENT_AMBULATORY_CARE_PROVIDER_SITE_OTHER): Payer: PPO | Admitting: Family Medicine

## 2020-05-17 ENCOUNTER — Other Ambulatory Visit: Payer: Self-pay

## 2020-05-17 ENCOUNTER — Other Ambulatory Visit: Payer: Self-pay | Admitting: *Deleted

## 2020-05-17 VITALS — BP 138/86 | Temp 97.3°F | Wt 156.6 lb

## 2020-05-17 DIAGNOSIS — I10 Essential (primary) hypertension: Secondary | ICD-10-CM | POA: Diagnosis not present

## 2020-05-17 DIAGNOSIS — E119 Type 2 diabetes mellitus without complications: Secondary | ICD-10-CM

## 2020-05-17 DIAGNOSIS — E039 Hypothyroidism, unspecified: Secondary | ICD-10-CM

## 2020-05-17 LAB — POCT GLYCOSYLATED HEMOGLOBIN (HGB A1C): Hemoglobin A1C: 5.4 % (ref 4.0–5.6)

## 2020-05-17 MED ORDER — CLONAZEPAM 1 MG PO TABS
1.0000 mg | ORAL_TABLET | Freq: Every evening | ORAL | 0 refills | Status: DC | PRN
Start: 2020-05-17 — End: 2020-07-05

## 2020-05-17 NOTE — Progress Notes (Signed)
05/17/20 called and discussed with pt. Pt verbalized understanding.

## 2020-05-17 NOTE — Telephone Encounter (Signed)
Klonopin 1 mg , 1 qhs prn insomnia, use sparingly, #20

## 2020-05-17 NOTE — Progress Notes (Signed)
   Subjective:    Patient ID: Joanna [name], female    DOB: Mar 30, 1951, 69 y.o.   MRN: [id number]  Hypertension This is a chronic problem. Pertinent negatives include no chest pain or shortness of breath. There are no compliance problems.   Diabetes She presents for her follow-up diabetic visit. She has type 2 diabetes mellitus. Pertinent negatives for hypoglycemia include no confusion or dizziness. Pertinent negatives for diabetes include no chest pain, no fatigue, no polydipsia, no polyphagia and no weakness. There are no diabetic complications. She is compliant with treatment all of the time.   Patient has complaints if Gout flare up in right foot.  Complains of pain in left foot.   Results for orders placed or performed in visit on 05/17/20  POCT glycosylated hemoglobin (Hb A1C)  Result Value Ref Range   Hemoglobin A1C 5.4 4.0 - 5.6 %   HbA1c POC (<> result, manual entry)     HbA1c, POC (prediabetic range)     HbA1c, POC (controlled diabetic range)      Review of Systems  Constitutional: Negative for activity change, appetite change and fatigue.  HENT: Negative for congestion and rhinorrhea.   Respiratory: Negative for cough and shortness of breath.   Cardiovascular: Negative for chest pain and leg swelling.  Gastrointestinal: Negative for abdominal pain and diarrhea.  Endocrine: Negative for polydipsia and polyphagia.  Skin: Negative for color change.  Neurological: Negative for dizziness and weakness.  Psychiatric/Behavioral: Negative for behavioral problems and confusion.       Objective:   Physical Exam Vitals reviewed.  Constitutional:      General: She is not in acute distress. HENT:     Head: Normocephalic and atraumatic.  Eyes:     General:        Right eye: No discharge.        Left eye: No discharge.  Neck:     Trachea: No tracheal deviation.  Cardiovascular:     Rate and Rhythm: Normal rate and regular rhythm.     Heart sounds: Normal heart sounds. No  murmur heard.   Pulmonary:     Effort: Pulmonary effort is normal. No respiratory distress.     Breath sounds: Normal breath sounds.  Lymphadenopathy:     Cervical: No cervical adenopathy.  Skin:    General: Skin is warm and dry.  Neurological:     Mental Status: She is alert.     Coordination: Coordination normal.  Psychiatric:        Behavior: Behavior normal.           Assessment & Plan:  1. Type 2 diabetes mellitus with hemoglobin A1c goal of less than 7.5% (HCC) A1c looks good but we would like to do venous A1c later this year along with other lab work - POCT glycosylated hemoglobin (Hb A1C)  2. Essential hypertension, benign Patient is due for a metabolic 7 she will go ahead and get this done - Basic metabolic panel  3. 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

## 2020-05-17 NOTE — Telephone Encounter (Signed)
Pt in office today and states she was suppose to have something called in for sleep.  [facility] [address]

## 2020-05-19 DIAGNOSIS — E039 Hypothyroidism, unspecified: Secondary | ICD-10-CM | POA: Diagnosis not present

## 2020-05-19 DIAGNOSIS — I10 Essential (primary) hypertension: Secondary | ICD-10-CM | POA: Diagnosis not present

## 2020-05-20 LAB — BASIC METABOLIC PANEL
BUN/Creatinine Ratio: 12 (ref 12–28)
BUN: 19 mg/dL (ref 8–27)
CO2: 23 mmol/L (ref 20–29)
Calcium: 10.1 mg/dL (ref 8.7–10.3)
Chloride: 101 mmol/L (ref 96–106)
Creatinine, Ser: 1.62 mg/dL — ABNORMAL HIGH (ref 0.57–1.00)
GFR calc Af Amer: 37 mL/min/{1.73_m2} — ABNORMAL LOW (ref >59)
GFR calc non Af Amer: 32 mL/min/{1.73_m2} — ABNORMAL LOW (ref >59)
Glucose: 85 mg/dL (ref 65–99)
Potassium: 3.4 mmol/L — ABNORMAL LOW (ref 3.5–5.2)
Sodium: 141 mmol/L (ref 134–144)

## 2020-05-20 LAB — TSH: TSH: 3.18 u[IU]/mL (ref 0.450–4.500)

## 2020-05-21 ENCOUNTER — Other Ambulatory Visit: Payer: Self-pay | Admitting: Family Medicine

## 2020-05-21 DIAGNOSIS — I10 Essential (primary) hypertension: Secondary | ICD-10-CM

## 2020-05-24 ENCOUNTER — Telehealth: Payer: Self-pay | Admitting: Family Medicine

## 2020-05-24 ENCOUNTER — Ambulatory Visit: Payer: PPO | Admitting: Family Medicine

## 2020-05-24 ENCOUNTER — Other Ambulatory Visit: Payer: Self-pay | Admitting: *Deleted

## 2020-05-24 MED ORDER — POTASSIUM CHLORIDE ER 10 MEQ PO TBCR: EXTENDED_RELEASE_TABLET | ORAL | 0 refills | Status: DC

## 2020-05-24 NOTE — Telephone Encounter (Signed)
[facility] is trying to get a 90 days supply RX was sent over 180 1 in the morning and 2 in evening the quantity 270 in order ti get a 90 day supply. potassium chloride (KLOR-CON) 10 MEQ tablet   Pharmacy Call back [phone] Ref # [id number]

## 2020-06-03 ENCOUNTER — Telehealth: Payer: Self-pay | Admitting: Family Medicine

## 2020-06-03 ENCOUNTER — Other Ambulatory Visit: Payer: Self-pay | Admitting: *Deleted

## 2020-06-03 DIAGNOSIS — M79674 Pain in right toe(s): Secondary | ICD-10-CM

## 2020-06-03 DIAGNOSIS — M109 Gout, unspecified: Secondary | ICD-10-CM

## 2020-06-03 DIAGNOSIS — N289 Disorder of kidney and ureter, unspecified: Secondary | ICD-10-CM

## 2020-06-03 MED ORDER — PREDNISONE 20 MG PO TABS
ORAL_TABLET | ORAL | 0 refills | Status: DC
Start: 2020-06-03 — End: 2020-08-11

## 2020-06-03 NOTE — Telephone Encounter (Signed)
Pt states she has had gout in her toe since July and dr scott checked toe in august when she came in. Taking allopurinol 100mg  qd and colchincine 0.6mg  bid. Pt states it is not getting any better. Wants to know if there is a different med she can take.   [address].

## 2020-06-03 NOTE — Telephone Encounter (Signed)
Discussed with pt. Pt verbalized understanding. Orders for bw and xray put in. Pt states it is her right great toe. Med sent to pharm.

## 2020-06-03 NOTE — Telephone Encounter (Signed)
So in this situation I would recommend several things  #1-patient should do metabolic 7, uric acid due to renal insufficiency and gout this would help [place] see what her level of uric acid is #2 I would recommend short course of prednisone 20 mg tablet, 2 daily for the next 6 days #3 it would be wise for the patient to do an x-ray of that foot with attention to the joint (nurses please clarify which joint/toe) in order to see if there is any signs of arthritic issues going on Then based upon these findings we can make further recommendations

## 2020-06-03 NOTE — Telephone Encounter (Signed)
Pt has gout and it is not improving meds are not working she is wanting to know is there anything she can do to help this.   Pt call back [phone]

## 2020-06-04 ENCOUNTER — Ambulatory Visit (HOSPITAL_COMMUNITY)
Admission: RE | Admit: 2020-06-04 | Discharge: 2020-06-04 | Disposition: A | Discharge status: Home / Self Care | Payer: PPO | Source: Ambulatory Visit | Attending: Family Medicine | Admitting: Family Medicine

## 2020-06-04 ENCOUNTER — Other Ambulatory Visit: Payer: Self-pay

## 2020-06-04 DIAGNOSIS — M79674 Pain in right toe(s): Secondary | ICD-10-CM | POA: Diagnosis not present

## 2020-06-04 DIAGNOSIS — M109 Gout, unspecified: Secondary | ICD-10-CM | POA: Diagnosis not present

## 2020-06-04 DIAGNOSIS — N289 Disorder of kidney and ureter, unspecified: Secondary | ICD-10-CM | POA: Diagnosis not present

## 2020-06-04 DIAGNOSIS — M7989 Other specified soft tissue disorders: Secondary | ICD-10-CM | POA: Diagnosis not present

## 2020-06-05 LAB — BASIC METABOLIC PANEL
BUN/Creatinine Ratio: 11 — ABNORMAL LOW (ref 12–28)
BUN: 19 mg/dL (ref 8–27)
CO2: 23 mmol/L (ref 20–29)
Calcium: 9.1 mg/dL (ref 8.7–10.3)
Chloride: 103 mmol/L (ref 96–106)
Creatinine, Ser: 1.68 mg/dL — ABNORMAL HIGH (ref 0.57–1.00)
GFR calc Af Amer: 36 mL/min/{1.73_m2} — ABNORMAL LOW (ref >59)
GFR calc non Af Amer: 31 mL/min/{1.73_m2} — ABNORMAL LOW (ref >59)
Glucose: 110 mg/dL — ABNORMAL HIGH (ref 65–99)
Potassium: 3.3 mmol/L — ABNORMAL LOW (ref 3.5–5.2)
Sodium: 142 mmol/L (ref 134–144)

## 2020-06-05 LAB — URIC ACID: Uric Acid: 5 mg/dL (ref 3.0–7.2)

## 2020-06-11 ENCOUNTER — Encounter: Payer: Self-pay | Admitting: Family Medicine

## 2020-06-11 ENCOUNTER — Ambulatory Visit (INDEPENDENT_AMBULATORY_CARE_PROVIDER_SITE_OTHER): Payer: PPO | Admitting: Family Medicine

## 2020-06-11 ENCOUNTER — Other Ambulatory Visit: Payer: Self-pay

## 2020-06-11 VITALS — BP 130/82 | Temp 97.6°F | Wt 157.4 lb

## 2020-06-11 DIAGNOSIS — N289 Disorder of kidney and ureter, unspecified: Secondary | ICD-10-CM | POA: Diagnosis not present

## 2020-06-11 DIAGNOSIS — M79674 Pain in right toe(s): Secondary | ICD-10-CM | POA: Diagnosis not present

## 2020-06-11 MED ORDER — VICTOZA 18 MG/3ML ~~LOC~~ SOPN: PEN_INJECTOR | SUBCUTANEOUS | 5 refills | Status: DC

## 2020-06-11 NOTE — Progress Notes (Signed)
   Subjective:    Patient ID: Joanna [name], female    DOB: 1951-09-27, 69 y.o.   MRN: [id number]  HPI Patient comes in today to follow up on right foot pain. Pain has improved slightly. Patient relates foot pain discomfort in the great MTP joint on the right side.  She had had an x-ray which showed some soft tissue swelling but otherwise was negative also had lab work which showed uric acid under 7 and showed next 7 with stable creatinine  Review of Systems  Constitutional: Negative for activity change and appetite change.  HENT: Negative for congestion and rhinorrhea.   Respiratory: Negative for cough and shortness of breath.   Cardiovascular: Negative for chest pain and leg swelling.  Gastrointestinal: Negative for abdominal pain, nausea and vomiting.  Skin: Negative for color change.  Neurological: Negative for dizziness and weakness.  Psychiatric/Behavioral: Negative for agitation and confusion.       Objective:   Physical Exam Vitals reviewed.  Constitutional:      General: She is not in acute distress. HENT:     Head: Normocephalic.  Cardiovascular:     Rate and Rhythm: Normal rate and regular rhythm.     Heart sounds: Normal heart sounds. No murmur heard.   Pulmonary:     Effort: Pulmonary effort is normal.     Breath sounds: Normal breath sounds.  Lymphadenopathy:     Cervical: No cervical adenopathy.  Neurological:     Mental Status: She is alert.  Psychiatric:        Behavior: Behavior normal.     Tenderness in the foot around the great MTP consistent with possibility of recent gout no current gout also could be some underlying arthritic changes.  X-ray was looked at labs was looked at      Assessment & Plan:  Foot pain doing better Tylenol as needed cool compresses as needed follow-up if ongoing troubles if not doing better over the next 2 weeks recommend referral to podiatry patient defers on podiatry currently patient to keep her follow-up visit in the  fall/December metabolic 7 stable Patient defers on pneumonia vaccine until that visit

## 2020-07-05 ENCOUNTER — Telehealth: Payer: Self-pay | Admitting: *Deleted

## 2020-07-05 MED ORDER — TRAZODONE HCL 50 MG PO TABS
ORAL_TABLET | ORAL | 2 refills | Status: DC
Start: 2020-07-05 — End: 2020-09-27

## 2020-07-05 NOTE — Telephone Encounter (Signed)
NP Joanna [name] with [facility] assigned to pt through her insurance for Sleep, depression and anxiety. Calling stating pt is scared to take her Clonazepam that has been prescribed and they are wanting to know if Dr. Nicki Reaper would be willing to send Trazodone 50mg  instead.  He states if the PCP is willing to send med they usually end care once it is done. Please advise.  CB# [phone]

## 2020-07-05 NOTE — Telephone Encounter (Signed)
Discontinue clonazepam Recommend trazodone 50 mg Instructions-1/2 tablet or 1 whole tablet nightly for sleep #30, 2 refills Patient should let us know if having any troubles with this otherwise follow-up visit should be in 4 to 6 weeks instead of early December

## 2020-07-05 NOTE — Telephone Encounter (Signed)
Patient notified, rx sent. Transferred to front to schedule appointment.

## 2020-07-06 ENCOUNTER — Inpatient Hospital Stay (HOSPITAL_COMMUNITY): Payer: PPO | Attending: Hematology

## 2020-07-06 ENCOUNTER — Other Ambulatory Visit: Payer: Self-pay

## 2020-07-06 DIAGNOSIS — F329 Major depressive disorder, single episode, unspecified: Secondary | ICD-10-CM | POA: Insufficient documentation

## 2020-07-06 DIAGNOSIS — E785 Hyperlipidemia, unspecified: Secondary | ICD-10-CM | POA: Insufficient documentation

## 2020-07-06 DIAGNOSIS — Z8349 Family history of other endocrine, nutritional and metabolic diseases: Secondary | ICD-10-CM | POA: Diagnosis not present

## 2020-07-06 DIAGNOSIS — K219 Gastro-esophageal reflux disease without esophagitis: Secondary | ICD-10-CM | POA: Diagnosis not present

## 2020-07-06 DIAGNOSIS — D509 Iron deficiency anemia, unspecified: Secondary | ICD-10-CM | POA: Diagnosis not present

## 2020-07-06 DIAGNOSIS — N189 Chronic kidney disease, unspecified: Secondary | ICD-10-CM | POA: Diagnosis not present

## 2020-07-06 DIAGNOSIS — E876 Hypokalemia: Secondary | ICD-10-CM | POA: Diagnosis not present

## 2020-07-06 DIAGNOSIS — Z833 Family history of diabetes mellitus: Secondary | ICD-10-CM | POA: Diagnosis not present

## 2020-07-06 DIAGNOSIS — F17210 Nicotine dependence, cigarettes, uncomplicated: Secondary | ICD-10-CM | POA: Diagnosis not present

## 2020-07-06 DIAGNOSIS — Z7952 Long term (current) use of systemic steroids: Secondary | ICD-10-CM | POA: Diagnosis not present

## 2020-07-06 DIAGNOSIS — Z90722 Acquired absence of ovaries, bilateral: Secondary | ICD-10-CM | POA: Insufficient documentation

## 2020-07-06 DIAGNOSIS — Z8249 Family history of ischemic heart disease and other diseases of the circulatory system: Secondary | ICD-10-CM | POA: Diagnosis not present

## 2020-07-06 DIAGNOSIS — E538 Deficiency of other specified B group vitamins: Secondary | ICD-10-CM | POA: Diagnosis not present

## 2020-07-06 DIAGNOSIS — Z79899 Other long term (current) drug therapy: Secondary | ICD-10-CM | POA: Diagnosis not present

## 2020-07-06 DIAGNOSIS — Z90710 Acquired absence of both cervix and uterus: Secondary | ICD-10-CM | POA: Diagnosis not present

## 2020-07-06 DIAGNOSIS — Z9079 Acquired absence of other genital organ(s): Secondary | ICD-10-CM | POA: Insufficient documentation

## 2020-07-06 DIAGNOSIS — E119 Type 2 diabetes mellitus without complications: Secondary | ICD-10-CM | POA: Insufficient documentation

## 2020-07-06 DIAGNOSIS — E039 Hypothyroidism, unspecified: Secondary | ICD-10-CM | POA: Insufficient documentation

## 2020-07-06 LAB — CBC WITH DIFFERENTIAL/PLATELET
Abs Immature Granulocytes: 0.03 10*3/uL (ref 0.00–0.07)
Basophils Absolute: 0 10*3/uL (ref 0.0–0.1)
Basophils Relative: 1 %
Eosinophils Absolute: 0.1 10*3/uL (ref 0.0–0.5)
Eosinophils Relative: 1 %
HCT: 33.5 % — ABNORMAL LOW (ref 36.0–46.0)
Hemoglobin: 10.5 g/dL — ABNORMAL LOW (ref 12.0–15.0)
Immature Granulocytes: 1 %
Lymphocytes Relative: 30 %
Lymphs Abs: 1.6 10*3/uL (ref 0.7–4.0)
MCH: 28.8 pg (ref 26.0–34.0)
MCHC: 31.3 g/dL (ref 30.0–36.0)
MCV: 92 fL (ref 80.0–100.0)
Monocytes Absolute: 0.3 10*3/uL (ref 0.1–1.0)
Monocytes Relative: 5 %
Neutro Abs: 3.4 10*3/uL (ref 1.7–7.7)
Neutrophils Relative %: 62 %
Platelets: 244 10*3/uL (ref 150–400)
RBC: 3.64 MIL/uL — ABNORMAL LOW (ref 3.87–5.11)
RDW: 13.8 % (ref 11.5–15.5)
WBC: 5.4 10*3/uL (ref 4.0–10.5)
nRBC: 0 % (ref 0.0–0.2)

## 2020-07-06 LAB — IRON AND TIBC
Iron: 52 ug/dL (ref 28–170)
Saturation Ratios: 19 % (ref 10.4–31.8)
TIBC: 274 ug/dL (ref 250–450)
UIBC: 222 ug/dL

## 2020-07-06 LAB — VITAMIN B12: Vitamin B-12: 1470 pg/mL — ABNORMAL HIGH (ref 180–914)

## 2020-07-06 LAB — COMPREHENSIVE METABOLIC PANEL
ALT: 12 U/L (ref 0–44)
AST: 18 U/L (ref 15–41)
Albumin: 3.6 g/dL (ref 3.5–5.0)
Alkaline Phosphatase: 69 U/L (ref 38–126)
Anion gap: 12 (ref 5–15)
BUN: 18 mg/dL (ref 8–23)
CO2: 24 mmol/L (ref 22–32)
Calcium: 9.1 mg/dL (ref 8.9–10.3)
Chloride: 106 mmol/L (ref 98–111)
Creatinine, Ser: 1.63 mg/dL — ABNORMAL HIGH (ref 0.44–1.00)
GFR calc Af Amer: 37 mL/min — ABNORMAL LOW (ref >60)
GFR calc non Af Amer: 32 mL/min — ABNORMAL LOW (ref >60)
Glucose, Bld: 163 mg/dL — ABNORMAL HIGH (ref 70–99)
Potassium: 3 mmol/L — ABNORMAL LOW (ref 3.5–5.1)
Sodium: 142 mmol/L (ref 135–145)
Total Bilirubin: 0.4 mg/dL (ref 0.3–1.2)
Total Protein: 6.7 g/dL (ref 6.5–8.1)

## 2020-07-06 LAB — FERRITIN: Ferritin: 357 ng/mL — ABNORMAL HIGH (ref 11–307)

## 2020-07-06 LAB — FOLATE: Folate: 30.4 ng/mL (ref >5.9)

## 2020-07-06 LAB — LACTATE DEHYDROGENASE: LDH: 165 U/L (ref 98–192)

## 2020-07-06 LAB — VITAMIN D 25 HYDROXY (VIT D DEFICIENCY, FRACTURES): Vit D, 25-Hydroxy: 40.13 ng/mL (ref 30–100)

## 2020-07-13 ENCOUNTER — Ambulatory Visit (HOSPITAL_COMMUNITY): Payer: PPO | Admitting: Nurse Practitioner

## 2020-07-14 ENCOUNTER — Other Ambulatory Visit: Payer: Self-pay | Admitting: *Deleted

## 2020-07-14 MED ORDER — LEVOTHYROXINE SODIUM 88 MCG PO TABS: ORAL_TABLET | ORAL | 1 refills | Status: DC

## 2020-07-15 ENCOUNTER — Inpatient Hospital Stay (HOSPITAL_BASED_OUTPATIENT_CLINIC_OR_DEPARTMENT_OTHER): Payer: PPO | Admitting: Nurse Practitioner

## 2020-07-15 ENCOUNTER — Other Ambulatory Visit: Payer: Self-pay

## 2020-07-15 VITALS — BP 129/77 | HR 82 | Temp 96.9°F | Resp 18 | Wt 154.3 lb

## 2020-07-15 DIAGNOSIS — D509 Iron deficiency anemia, unspecified: Secondary | ICD-10-CM

## 2020-07-15 DIAGNOSIS — R918 Other nonspecific abnormal finding of lung field: Secondary | ICD-10-CM | POA: Diagnosis not present

## 2020-07-15 NOTE — Assessment & Plan Note (Signed)
1.  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 -Her last Feraheme infusion was on 11/17/2019 and 12/08/2019 -Patient reports ice pica.  She denies any bleeding per rectum or melena. -She cannot tolerate oral iron because of constipation. -We talked about Procrit injections on her last visit for when hemoglobin stays in the 9 and we cannot control it with iron infusions.. -Labs today on 07/06/2020 showed her hemoglobin 10.5, ferritin 357, percent saturation 19  -We will set her up with one iron infusion at this time. - She will follow-up in 4 months with repeat labs.  2.  Hypokalemia: - Patient reports she has had problems with her potassium being low for months.  She is being followed by her PCP for this. -Her PCP recently prescribed her 20 mEq of potassium to take daily.  However she has not been taking this as prescribed. -Labs on 03/03/2020 showed her potassium 2.8.  She reports she did not take her potassium prior to these labs.  She reports her PCP has increased her dose to 2 a day.  She reports she is still not taking the pills as prescribed. -We told her to take 2 pills when she got home today. -Labs done on 07/06/2020 showed potassium 3.0.  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  She will start back on it today. -She did not want her labs rechecked today.  3.  Smoking history: -She is a current active smoker, smokes half a pack per day for 50 years. -CT CAP from 11/07/2019.  Showed new 4 mm right lower lobe lung nodule is nonspecific but likely benign in origin.  Noncontrast CT chest is recommended in 12 months.  Suggested thickening of the greater curvature of the stomach in the region of the fundus and body, exaggerated by under distention of the stomach. -We will repeat a CT of the chest on her next visit.  4.  Health maintenance: -Mammogram was on 03/17/2019 was B RADS category 1 - Her last  colonoscopy was 06/06/2019 which showed the terminal ileum appeared normal.  A 6 mm polyp was found in the rectum.  The polyp was sessile.  The polyp was removed with a hot snare.  Multiple small and large mouth diverticula were found in the entire colon.  External and internal hemorrhoids were found.  The hemorrhoids were moderate.  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  Follow-up colonoscopy in 10 years  5.  Unexplained weight loss: - Patient has lost 47 pounds in less than a year. -She reports she is not have an appetite. -We ordered a CT scan of the CAP with oral contrast on 01/08/2019 which showed a solitary right lower lobe solid 4 mm pulmonary nodule, mildly increased in size from 2 mm in 2010.  Likely benign nodule due to the slow growth. - We will follow-up with a chest CT in 6 to 12 months from last scan. -CT CAP done on 11/07/2019 showed new 4 mm right lower lobe lung nodule is nonspecific but likely benign.  Showed subjective thickening of the greater curvature the stomach in the region of the fundus and body, exaggerated by under distention of the stomach.  She had an EGD in 2016 and stomach was normal.  She does not have any abdominal pain or bloating at this time.  She has not had any more weight loss in the past 6 months.  6.  Chronic kidney disease: -  Her creatinine has been elevated since 2014. - Labs done on 07/06/2020 showed her creatinine 1.63  7.  Folic acid deficiency: -Folic acid was 4.5. -She is now on folic acid 1 mg daily. -Labs done on 11/07/7988 showed folic acid has increased to 30.4

## 2020-07-15 NOTE — Progress Notes (Signed)
[place], [place] [address]   CLINIC:  Medical Oncology/Hematology  PCP:  [name], MD [address] [place] [address] [phone]   REASON FOR VISIT: Follow-up for iron deficiency anemia   CURRENT THERAPY: Intermittent iron infusions   INTERVAL HISTORY:  [name] 69 y.o. female returns for routine follow-up for iron deficiency anemia.  Patient reports she is doing well since her last visit.  She does report her fatigue levels are increasing.  She denies any bright red bleeding per rectum or melena.  She denies easy bruising or bleeding. Denies any nausea, vomiting, or diarrhea. Denies any new pains. Had not noticed any recent bleeding such as epistaxis, hematuria or hematochezia. Denies recent chest pain on exertion, shortness of breath on minimal exertion, pre-syncopal episodes, or palpitations. Denies any numbness or tingling in hands or feet. Denies any recent fevers, infections, or recent hospitalizations. Patient reports appetite at 75% and energy level at 50%.     REVIEW OF SYSTEMS:  Review of Systems  Constitutional: Positive for fatigue.  Gastrointestinal: Positive for diarrhea.  Neurological: Positive for headaches.  Psychiatric/Behavioral: Positive for sleep disturbance.  All other systems reviewed and are negative.    PAST MEDICAL/SURGICAL HISTORY:  Past Medical History:  Diagnosis Date  . 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 Bilateral headaches   . Depression   . Diabetes mellitus   . Diverticulosis   . Dyslipidemia   . GERD (gastroesophageal reflux disease)   . Hypertension   . Insomnia   . Microproteinuria   . Renal insufficiency 2010  . Right carpal tunnel syndrome 12/24/2017  . Sleep apnea   . Sleep apnea 12/25/2017   Abnormal sleep study February 2019 CPAP ordered  . Thyroid disease    hypothyroid   Past Surgical History:  Procedure Laterality  Date  . ABDOMINAL HYSTERECTOMY  1984  . BACK SURGERY    . BILATERAL OOPHORECTOMY    . BIOPSY  02/19/2020   Procedure: BIOPSY;  Surgeon: [name], MD;  [facility]: [facility];  Service: Endoscopy;;  gastric  . CERVICAL DISC SURGERY  01/16/2019  . COLONOSCOPY  01/29/12   Fields-2-3 hyperplastic polyps, mod internal hemorrhoids, diverticulosis throughout colon  . COLONOSCOPY N/A 07/03/2014   Dr. Oneida Alar: Hyperplastic polyps  . COLONOSCOPY N/A 06/06/2019   Dr. Oneida Alar: Diverticulosis, hemorrhoids, single polyp removed, pathology revealed lipoma.  Next colonoscopy 10 years  . ESOPHAGOGASTRODUODENOSCOPY   01/29/2012   Fields-2-3cm sliding HH, fundic gland polyps, NEGATIVE H pylori  . ESOPHAGOGASTRODUODENOSCOPY N/A 07/03/2014   Dr. Oneida Alar: gastritis and normal small bowel biopsies  . ESOPHAGOGASTRODUODENOSCOPY N/A 02/19/2020   Procedure: ESOPHAGOGASTRODUODENOSCOPY (EGD);  Surgeon: [name], MD;  [facility];  Service: Endoscopy;  Laterality: N/A;  12:00pm  . GIVENS CAPSULE STUDY N/A 10/26/2014   SMALL BOWEL AVMs  . KNEE ARTHROSCOPY Right   . POLYPECTOMY  06/06/2019   Procedure: POLYPECTOMY;  Surgeon: [name], MD;  Location: [facility];  Service: Endoscopy;;  . TOTAL ABDOMINAL HYSTERECTOMY W/ BILATERAL SALPINGOOPHORECTOMY       SOCIAL HISTORY:  Social History   Socioeconomic History  . Marital status: Divorced    Spouse name: Not on file  . Number of children: 2  . Years of education: Not on file  . Highest education level: Not on file  Occupational History  . Occupation: retired    Comment: 2014 or so  Tobacco Use  . Smoking status: Current Every Day Smoker    Packs/day: 0.25    Years: 30.00    Pack years: 7.50    Types: Cigarettes    Last attempt to quit: 10/16/2010    Years since quitting: 9.7  . Smokeless tobacco: Former Systems developer    Quit date: 10/28/2010  Vaping Use  . Vaping Use: Never used  Substance and Sexual Activity  . Alcohol use: Yes     Comment: socially, maybe 3 times a year   . Drug use: No  . Sexual activity: Not on file  Other Topics Concern  . Not on file  Social History Narrative   Right handed    Lives alone    Caffeine- 5-6 cups per day    Social Determinants of Health   Financial Resource Strain:   . Difficulty of Paying Living Expenses: Not on file  Food Insecurity:   . Worried About Charity fundraiser in the Last Year: Not on file  . Ran Out of Food in the Last Year: Not on file  Transportation Needs:   . Lack of Transportation (Medical): Not on file  . Lack of Transportation (Non-Medical): Not on file  Physical Activity:   . Days of Exercise per Week: Not on file  . Minutes of Exercise per Session: Not on file  Stress:   . Feeling of Stress : Not on file  Social Connections:   . Frequency of Communication with Friends and Family: Not on file  . Frequency of Social Gatherings with Friends and Family: Not on file  . Attends Religious Services: Not on file  . Active Member of Clubs or Organizations: Not on file  . Attends Archivist Meetings: Not on file  . Marital Status: Not on file  Intimate Partner Violence:   . Fear of Current or Ex-Partner: Not on file  . Emotionally Abused: Not on file  . Physically Abused: Not on file  . Sexually Abused: Not on file    FAMILY HISTORY:  Family History  Problem Relation Age of Onset  . Cardiomyopathy Father   . Heart failure Mother   . Hyperlipidemia Mother   . Stroke Brother   . Kidney cancer Brother   . Diabetes Paternal Uncle   . Heart disease Paternal Grandmother   . Colon cancer Neg Hx   . Colon polyps Neg Hx     CURRENT MEDICATIONS:  Outpatient Encounter Medications as of 07/15/2020  Medication Sig Note  . allopurinol (ZYLOPRIM) 100 MG tablet Take one tablet by mouth daily (Patient taking differently: Take 100 mg by mouth daily. Take one tablet by mouth daily)   . amLODipine (NORVASC) 5 MG tablet Take 1 tablet (5 mg total) by  mouth daily.   . Cyanocobalamin (VITAMIN B-12 PO) Take 1 tablet by mouth daily.   . ferrous sulfate 325 (65 FE) MG tablet Take 325 mg by mouth daily with breakfast.   . fexofenadine (ALLEGRA) 180 MG tablet Take 180 mg by mouth daily.   . folic acid (FOLVITE) 1 MG tablet TAKE 1 TABLET(1 MG) BY MOUTH DAILY   . indapamide (LOZOL) 1.25 MG tablet Take 1 tablet (1.25 mg total) by mouth every morning.   . Lancets (ONETOUCH DELICA PLUS [id number]) MISC TEST ONCE DAILY   . levothyroxine (SYNTHROID) 88 MCG tablet Take 1/2 tablet by mouth on Mondays and fridays and 1 tablet on all other days   . liraglutide (VICTOZA) 18 MG/3ML SOPN Inject 1.2mg   into the skin daily   . losartan (COZAAR) 100 MG tablet Take 1 tablet (100 mg total) by mouth daily.   . Magnesium 250 MG TABS Take 250 mg by mouth daily.    Marland Kitchen omeprazole (PRILOSEC) 40 MG capsule Take 1 capsule (40 mg total) by mouth daily.   Glory Rosebush VERIO test strip TEST ONCE DAILY   . potassium chloride (KLOR-CON) 10 MEQ tablet Take one tablet po in the morning and two  po in the evening.   . predniSONE (DELTASONE) 20 MG tablet Take 2 tablets daily for 6 days   . rosuvastatin (CRESTOR) 20 MG tablet Take 1 tablet by mouth daily (Stop Pravastatin)   . Simethicone (GAS-X PO) Take 1 capsule by mouth as needed (gas/bloating.).    Marland Kitchen tiZANidine (ZANAFLEX) 2 MG tablet TAKE 1 TABLET BY MOUTH TWICE DAILY FOR 2 WEEKS THEN TAKE 1 TABLET BY MOUTH THREE TIMES DAILY   . topiramate (TOPAMAX) 50 MG tablet Take 1 tablet (50 mg total) by mouth 2 (two) times daily.   . traZODone (DESYREL) 50 MG tablet 1/2 - 1 tablet nightly for sleep   . acetaminophen (TYLENOL) 500 MG tablet Take 1,000 mg by mouth every 8 (eight) hours as needed for moderate pain. (Patient not taking: Reported on 07/15/2020)   . albuterol (PROVENTIL HFA;VENTOLIN HFA) 108 (90 Base) MCG/ACT inhaler Inhale 2 puffs into the lungs every 4 (four) hours as needed for wheezing or shortness of breath. (Patient not taking:  Reported on 07/15/2020)   . colchicine 0.6 MG tablet TAKE 1 TABLET BY MOUTH TWICE DAILY AS NEEDED FOR GOUT (Patient not taking: Reported on 07/15/2020)   . fluticasone (FLONASE) 50 MCG/ACT nasal spray PLACE 2 SPRAYS INTO THE NOSE AS NEEDED. (Patient not taking: Reported on 07/15/2020) 05/05/2016: otc   No facility-administered encounter medications on file as of 07/15/2020.    ALLERGIES:  Allergies  Allergen Reactions  . Ivp Dye [Iodinated Diagnostic Agents]   . Zoloft [Sertraline Hcl]     Withdrawn,bad dreams     PHYSICAL EXAM:  ECOG Performance status: 1  Vitals:   07/15/20 0952  BP: 129/77  Pulse: 82  Resp: 18  Temp: (!) 96.9 F (36.1 C)  SpO2: 100%   Filed Weights   07/15/20 0952  Weight: 154 lb 4.8 oz (70 kg)   Physical Exam Constitutional:      Appearance: Normal appearance. She is normal weight.  Cardiovascular:     Rate and Rhythm: Normal rate and regular rhythm.     Heart sounds: Normal heart sounds.  Pulmonary:     Effort: Pulmonary effort is normal.     Breath sounds: Normal breath sounds.  Abdominal:     General: Bowel sounds are normal.     Palpations: Abdomen is soft.  Musculoskeletal:        General: Normal range of motion.  Skin:    General: Skin is warm.  Neurological:     Mental Status: She is alert and oriented to person, place, and time. Mental status is at baseline.  Psychiatric:        Mood and Affect: Mood normal.        Behavior: Behavior normal.        Thought Content: Thought content normal.        Judgment: Judgment normal.      LABORATORY DATA:  I have reviewed the labs as listed.  CBC    Component Value Date/Time   WBC 5.4 07/06/2020 1324  RBC 3.64 (L) 07/06/2020 1324   HGB 10.5 (L) 07/06/2020 1324   HGB 10.1 (L) 11/13/2018 1205   HCT 33.5 (L) 07/06/2020 1324   HCT 32.5 (L) 11/13/2018 1205   PLT 244 07/06/2020 1324   PLT 366 11/13/2018 1205   MCV 92.0 07/06/2020 1324   MCV 80 11/13/2018 1205   MCH 28.8 07/06/2020 1324    MCHC 31.3 07/06/2020 1324   RDW 13.8 07/06/2020 1324   RDW 16.5 (H) 11/13/2018 1205   LYMPHSABS 1.6 07/06/2020 1324   LYMPHSABS 2.4 11/13/2018 1205   MONOABS 0.3 07/06/2020 1324   EOSABS 0.1 07/06/2020 1324   EOSABS 0.1 11/13/2018 1205   BASOSABS 0.0 07/06/2020 1324   BASOSABS 0.1 11/13/2018 1205   CMP Latest Ref Rng & Units 07/06/2020 06/04/2020 05/19/2020  Glucose 70 - 99 mg/dL 163(H) 110(H) 85  BUN 8 - 23 mg/dL 18 19 19   Creatinine 0.44 - 1.00 mg/dL 1.63(H) 1.68(H) 1.62(H)  Sodium 135 - 145 mmol/L 142 142 141  Potassium 3.5 - 5.1 mmol/L 3.0(L) 3.3(L) 3.4(L)  Chloride 98 - 111 mmol/L 106 103 101  CO2 22 - 32 mmol/L 24 23 23   Calcium 8.9 - 10.3 mg/dL 9.1 9.1 10.1  Total Protein 6.5 - 8.1 g/dL 6.7 - -  Total Bilirubin 0.3 - 1.2 mg/dL 0.4 - -  Alkaline Phos 38 - 126 U/L 69 - -  AST 15 - 41 U/L 18 - -  ALT 0 - 44 U/L 12 - -    All questions were answered to patient's stated satisfaction. Encouraged patient to call with any new concerns or questions before his next visit to the cancer center and we can certain see him sooner, if needed.     ASSESSMENT & PLAN:  IDA (iron deficiency anemia) 1.  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 -Her last Feraheme infusion was on 11/17/2019 and 12/08/2019 -Patient reports ice pica.  She denies any bleeding per rectum or melena. -She cannot tolerate oral iron because of constipation. -We talked about Procrit injections on her last visit for when hemoglobin stays in the 9 and we cannot control it with iron infusions.. -Labs today on 07/06/2020 showed her hemoglobin 10.5, ferritin 357, percent saturation 19  -We will set her up with one iron infusion at this time. - She will follow-up in 4 months with repeat labs.  2.  Hypokalemia: - Patient reports she has had problems with her potassium being low for months.  She is being followed by her PCP for this. -Her PCP recently  prescribed her 20 mEq of potassium to take daily.  However she has not been taking this as prescribed. -Labs on 03/03/2020 showed her potassium 2.8.  She reports she did not take her potassium prior to these labs.  She reports her PCP has increased her dose to 2 a day.  She reports she is still not taking the pills as prescribed. -We told her to take 2 pills when she got home today. -Labs done on 07/06/2020 showed potassium 3.0.  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  She will start back on it today. -She did not want her labs rechecked today.  3.  Smoking history: -She is a current active smoker, smokes half a pack per day for 50 years. -CT CAP from 11/07/2019.  Showed new 4 mm right lower lobe lung nodule is nonspecific but likely benign in origin.  Noncontrast CT chest is recommended in 12 months.  Suggested thickening of the greater curvature of the stomach in the region of the fundus and body, exaggerated by under distention of the stomach. -We will repeat a CT of the chest on her next visit.  4.  Health maintenance: -Mammogram was on 03/17/2019 was B RADS category 1 - Her last colonoscopy was 06/06/2019 which showed the terminal ileum appeared normal.  A 6 mm polyp was found in the rectum.  The polyp was sessile.  The polyp was removed with a hot snare.  Multiple small and large mouth diverticula were found in the entire colon.  External and internal hemorrhoids were found.  The hemorrhoids were moderate.  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  Follow-up colonoscopy in 10 years  5.  Unexplained weight loss: - Patient has lost 47 pounds in less than a year. -She reports she is not have an appetite. -We ordered a CT scan of the CAP with oral contrast on 01/08/2019 which showed a solitary right lower lobe solid 4 mm pulmonary nodule, mildly increased in size from 2 mm in 2010.  Likely benign nodule due to the slow growth. - We will follow-up with a chest CT in 6 to 12 months from  last scan. -CT CAP done on 11/07/2019 showed new 4 mm right lower lobe lung nodule is nonspecific but likely benign.  Showed subjective thickening of the greater curvature the stomach in the region of the fundus and body, exaggerated by under distention of the stomach.  She had an EGD in 2016 and stomach was normal.  She does not have any abdominal pain or bloating at this time.  She has not had any more weight loss in the past 6 months.  6.  Chronic kidney disease: - Her creatinine has been elevated since 2014. - Labs done on 07/06/2020 showed her creatinine 1.63  7.  Folic acid deficiency: -Folic acid was 4.5. -She is now on folic acid 1 mg daily. -Labs done on 6/83/4196 showed folic acid has increased to 30.4     Orders placed this encounter:  Orders Placed This Encounter  Procedures  . CT Chest Wo Contrast  . CBC with Differential/Platelet  . Comprehensive metabolic panel  . Ferritin  . Iron and TIBC  . Lactate dehydrogenase  . Vitamin B12  . VITAMIN D 25 Hydroxy (Vit-D Deficiency, Fractures)      Francene Finders, FNP-C [place] [phone]

## 2020-07-20 ENCOUNTER — Other Ambulatory Visit: Payer: Self-pay | Admitting: *Deleted

## 2020-07-20 MED ORDER — AMLODIPINE BESYLATE 5 MG PO TABS
5.0000 mg | ORAL_TABLET | Freq: Every day | ORAL | 1 refills | Status: DC
Start: 2020-07-20 — End: 2020-11-29

## 2020-07-26 ENCOUNTER — Other Ambulatory Visit (HOSPITAL_COMMUNITY): Payer: Self-pay

## 2020-07-26 DIAGNOSIS — N1832 Chronic kidney disease, stage 3b: Secondary | ICD-10-CM | POA: Diagnosis not present

## 2020-07-26 DIAGNOSIS — E785 Hyperlipidemia, unspecified: Secondary | ICD-10-CM | POA: Diagnosis not present

## 2020-07-26 DIAGNOSIS — D631 Anemia in chronic kidney disease: Secondary | ICD-10-CM | POA: Diagnosis not present

## 2020-07-26 DIAGNOSIS — Z720 Tobacco use: Secondary | ICD-10-CM | POA: Diagnosis not present

## 2020-07-26 DIAGNOSIS — I129 Hypertensive chronic kidney disease with stage 1 through stage 4 chronic kidney disease, or unspecified chronic kidney disease: Secondary | ICD-10-CM | POA: Diagnosis not present

## 2020-07-26 DIAGNOSIS — R911 Solitary pulmonary nodule: Secondary | ICD-10-CM | POA: Diagnosis not present

## 2020-07-26 DIAGNOSIS — E1122 Type 2 diabetes mellitus with diabetic chronic kidney disease: Secondary | ICD-10-CM | POA: Diagnosis not present

## 2020-07-26 DIAGNOSIS — I2510 Atherosclerotic heart disease of native coronary artery without angina pectoris: Secondary | ICD-10-CM | POA: Diagnosis not present

## 2020-07-26 DIAGNOSIS — N2581 Secondary hyperparathyroidism of renal origin: Secondary | ICD-10-CM | POA: Diagnosis not present

## 2020-07-27 MED ORDER — FOLIC ACID 1 MG PO TABS: ORAL_TABLET | ORAL | 2 refills | Status: DC

## 2020-07-28 ENCOUNTER — Other Ambulatory Visit: Payer: Self-pay

## 2020-07-28 MED ORDER — ROSUVASTATIN CALCIUM 20 MG PO TABS: ORAL_TABLET | ORAL | 0 refills | Status: DC

## 2020-08-01 ENCOUNTER — Other Ambulatory Visit: Payer: Self-pay | Admitting: Family Medicine

## 2020-08-02 ENCOUNTER — Ambulatory Visit (HOSPITAL_COMMUNITY): Payer: PPO

## 2020-08-04 ENCOUNTER — Encounter (HOSPITAL_COMMUNITY): Payer: Self-pay

## 2020-08-04 ENCOUNTER — Other Ambulatory Visit: Payer: Self-pay

## 2020-08-04 ENCOUNTER — Inpatient Hospital Stay (HOSPITAL_COMMUNITY): Payer: PPO | Attending: Hematology

## 2020-08-04 VITALS — BP 113/78 | HR 77 | Temp 97.2°F | Resp 18

## 2020-08-04 DIAGNOSIS — D509 Iron deficiency anemia, unspecified: Secondary | ICD-10-CM | POA: Diagnosis not present

## 2020-08-04 DIAGNOSIS — E611 Iron deficiency: Secondary | ICD-10-CM

## 2020-08-04 MED ORDER — SODIUM CHLORIDE 0.9 % IV SOLN: Freq: Once | INTRAVENOUS | Status: DC

## 2020-08-04 MED ORDER — SODIUM CHLORIDE 0.9 % IV SOLN
510.0000 mg | Freq: Once | INTRAVENOUS | Status: AC
  Administered 2020-08-04: 510 mg via INTRAVENOUS
  Filled 2020-08-04: qty 510

## 2020-08-04 MED ORDER — SODIUM CHLORIDE 0.9 % IV SOLN: Freq: Once | INTRAVENOUS | Status: AC

## 2020-08-04 NOTE — Patient Instructions (Signed)
[facility] at [facility] Discharge Instructions  Received Feraheme infusion today. Follow-up as scheduled   Thank you for choosing [facility] at [facility] to provide your oncology and hematology care.  To afford each patient quality time with our provider, please arrive at least 15 minutes before your scheduled appointment time.   If you have a lab appointment with the Cancer Center please come in thru the Main Entrance and check in at the main information desk.  You need to re-schedule your appointment should you arrive 10 or more minutes late.  We strive to give you quality time with our providers, and arriving late affects you and other patients whose appointments are after yours.  Also, if you no show three or more times for appointments you may be dismissed from the clinic at the providers discretion.     Again, thank you for choosing [facility].  Our hope is that these requests will decrease the amount of time that you wait before being seen by our physicians.       _____________________________________________________________  Should you have questions after your visit to [facility], please contact our office at [phone] and follow the prompts.  Our office hours are 8:00 a.m. and 4:30 p.m. Monday - Friday.  Please note that voicemails left after 4:00 p.m. may not be returned until the following business day.  We are closed weekends and major holidays.  You do have access to a nurse 24-7, just call the main number to the clinic [phone] and do not press any options, hold on the line and a nurse will answer the phone.    For prescription refill requests, have your pharmacy contact our office and allow 72 hours.    Due to Covid, you will need to wear a mask upon entering the hospital. If you do not have a mask, a mask will be given to you at the Main Entrance upon arrival. For doctor visits, patients may have  1 support person age 18 or older with them. For treatment visits, patients can not have anyone with them due to social distancing guidelines and our immunocompromised population.     

## 2020-08-04 NOTE — Progress Notes (Signed)
Joanna [name] tolerated Feraheme infusion well without complaints or incident. Peripheral IV site checked with positive blood return noted prior to and after infusion. VSS upon discharge. Pt discharged self ambulatory in satisfactory condition

## 2020-08-11 ENCOUNTER — Ambulatory Visit (INDEPENDENT_AMBULATORY_CARE_PROVIDER_SITE_OTHER): Payer: PPO | Admitting: Family Medicine

## 2020-08-11 ENCOUNTER — Encounter: Payer: Self-pay | Admitting: Family Medicine

## 2020-08-11 ENCOUNTER — Other Ambulatory Visit: Payer: Self-pay

## 2020-08-11 VITALS — BP 128/84 | Temp 97.0°F | Wt 152.8 lb

## 2020-08-11 DIAGNOSIS — R29898 Other symptoms and signs involving the musculoskeletal system: Secondary | ICD-10-CM | POA: Diagnosis not present

## 2020-08-11 DIAGNOSIS — M545 Low back pain, unspecified: Secondary | ICD-10-CM

## 2020-08-11 DIAGNOSIS — Z23 Encounter for immunization: Secondary | ICD-10-CM | POA: Diagnosis not present

## 2020-08-11 DIAGNOSIS — N289 Disorder of kidney and ureter, unspecified: Secondary | ICD-10-CM | POA: Diagnosis not present

## 2020-08-11 MED ORDER — HYDROCODONE-ACETAMINOPHEN 5-325 MG PO TABS
1.0000 | ORAL_TABLET | Freq: Four times a day (QID) | ORAL | 0 refills | Status: AC | PRN
Start: 2020-08-11 — End: 2020-08-16

## 2020-08-11 NOTE — Progress Notes (Signed)
   Subjective:    Patient ID: Joanna [name], female    DOB: 1950-10-18, 69 y.o.   MRN: [id number]  Back Pain This is a new problem. Episode onset: 1 week. The problem occurs constantly. Pain location: Low right sided back pain.   Left arm weakness - Plan: Ambulatory referral to Neurology  Need for vaccination - Plan: Flu Vaccine QUAD High Dose(Fluad)  Renal insufficiency  Lumbar pain  Patient has progressive left arm weakness as well as spasticity in her hand.  Having a lot of contractions.  She saw neurology a while back that did an MRI of her brain looked at the MRI of her neck and recommended for her to try muscle relaxer this really has not seemed to help.  Patient is frustrated by her weakness her progressive disability making it harder for her to live by herself.  She does relate lumbar pain on the right lower back does not radiate down the leg her back after trying to move a heavy table on her own denies having this problem for trying Tylenol but did not take hydrocodone that she had leftover  She also has some renal insufficiency which she is seen the kidney doctor but unfortunately it is not possible for her to be on anti-inflammatories because of this   Review of Systems  Musculoskeletal: Positive for back pain.       Objective:   Physical Exam Lungs are clear heart regular pulse normal extremities no edema patient has spasticity on the left hand along with some muscle tightness of the left arm.       Assessment & Plan:  1. Left arm weakness I am concerned about this left arm weakness it is definitely worse than what it was.  Patient would like further opinion I believe this would be wise I would like to get her in with neurology for second opinion regarding this Patient needs to have a more definitive diagnosis of what has caused this If it was the abnormality in her neck is there any recommendation moving forward? What can the patient expect long-term? Hopefully all  of these can be answered by neurology - Ambulatory referral to Neurology  2. Need for vaccination Flu vaccine - Flu Vaccine QUAD High Dose(Fluad)  3. Renal insufficiency Significant renal insufficiency stop all anti-inflammatories.  Continue losartan currently in monitor closely patient will do lab work before next visit  4. Lumbar pain Low back pain discomfort stretching exercises recommended Tylenol as needed hydrocodone when necessary for severe pain caution drowsiness  Lab work before her next visit

## 2020-08-12 ENCOUNTER — Telehealth: Payer: Self-pay | Admitting: Family Medicine

## 2020-08-12 DIAGNOSIS — E785 Hyperlipidemia, unspecified: Secondary | ICD-10-CM

## 2020-08-12 DIAGNOSIS — E119 Type 2 diabetes mellitus without complications: Secondary | ICD-10-CM

## 2020-08-12 DIAGNOSIS — N289 Disorder of kidney and ureter, unspecified: Secondary | ICD-10-CM

## 2020-08-12 DIAGNOSIS — I10 Essential (primary) hypertension: Secondary | ICD-10-CM

## 2020-08-12 DIAGNOSIS — M109 Gout, unspecified: Secondary | ICD-10-CM

## 2020-08-12 NOTE — Telephone Encounter (Signed)
Nurses This patient was recently seen.  Before her follow-up visit at the start of December she will need the following J8H, metabolic 7, lipid, uric acid level Diagnosis diabetes hyperlipidemia gout It would be wise to mail the patient the papers on this she has a lot of other labs outstanding and I would be very concerned that [facility] would draw the wrong blood.  It will be important for her to bring her papers with her when she gets this lab done to ensure that [facility] draws the correct test thank you

## 2020-08-13 NOTE — Telephone Encounter (Signed)
Blood work ordered in Epic. Patient notified and verbalized understanding. ?

## 2020-08-13 NOTE — Addendum Note (Signed)
Addended by: Dairl Ponder on: 08/13/2020 10:45 AM   Modules accepted: Orders

## 2020-08-13 NOTE — Addendum Note (Signed)
Addended by: Dairl Ponder on: 08/13/2020 09:32 AM   Modules accepted: Orders

## 2020-08-23 ENCOUNTER — Encounter: Payer: Self-pay | Admitting: Gastroenterology

## 2020-08-23 ENCOUNTER — Ambulatory Visit: Payer: PPO | Admitting: Gastroenterology

## 2020-08-25 ENCOUNTER — Other Ambulatory Visit: Payer: Self-pay | Admitting: *Deleted

## 2020-08-25 MED ORDER — TOPIRAMATE 50 MG PO TABS
50.0000 mg | ORAL_TABLET | Freq: Two times a day (BID) | ORAL | 0 refills | Status: DC
Start: 2020-08-25 — End: 2020-10-29

## 2020-08-30 DIAGNOSIS — H524 Presbyopia: Secondary | ICD-10-CM | POA: Diagnosis not present

## 2020-08-30 LAB — HM DIABETES EYE EXAM

## 2020-09-01 DIAGNOSIS — G4733 Obstructive sleep apnea (adult) (pediatric): Secondary | ICD-10-CM | POA: Diagnosis not present

## 2020-09-03 ENCOUNTER — Other Ambulatory Visit: Payer: Self-pay | Admitting: Family Medicine

## 2020-09-15 ENCOUNTER — Ambulatory Visit: Payer: PPO | Admitting: Family Medicine

## 2020-09-21 DIAGNOSIS — I10 Essential (primary) hypertension: Secondary | ICD-10-CM | POA: Diagnosis not present

## 2020-09-21 DIAGNOSIS — N289 Disorder of kidney and ureter, unspecified: Secondary | ICD-10-CM | POA: Diagnosis not present

## 2020-09-21 DIAGNOSIS — E119 Type 2 diabetes mellitus without complications: Secondary | ICD-10-CM | POA: Diagnosis not present

## 2020-09-21 DIAGNOSIS — M109 Gout, unspecified: Secondary | ICD-10-CM | POA: Diagnosis not present

## 2020-09-21 DIAGNOSIS — E785 Hyperlipidemia, unspecified: Secondary | ICD-10-CM | POA: Diagnosis not present

## 2020-09-22 ENCOUNTER — Encounter: Payer: Self-pay | Admitting: Family Medicine

## 2020-09-22 ENCOUNTER — Ambulatory Visit (INDEPENDENT_AMBULATORY_CARE_PROVIDER_SITE_OTHER): Payer: PPO | Admitting: Family Medicine

## 2020-09-22 ENCOUNTER — Other Ambulatory Visit: Payer: Self-pay

## 2020-09-22 VITALS — BP 136/86 | HR 90 | Temp 97.3°F | Wt 151.0 lb

## 2020-09-22 DIAGNOSIS — N289 Disorder of kidney and ureter, unspecified: Secondary | ICD-10-CM

## 2020-09-22 DIAGNOSIS — E039 Hypothyroidism, unspecified: Secondary | ICD-10-CM | POA: Diagnosis not present

## 2020-09-22 DIAGNOSIS — E119 Type 2 diabetes mellitus without complications: Secondary | ICD-10-CM

## 2020-09-22 DIAGNOSIS — Z23 Encounter for immunization: Secondary | ICD-10-CM

## 2020-09-22 DIAGNOSIS — R29898 Other symptoms and signs involving the musculoskeletal system: Secondary | ICD-10-CM

## 2020-09-22 DIAGNOSIS — I10 Essential (primary) hypertension: Secondary | ICD-10-CM | POA: Diagnosis not present

## 2020-09-22 LAB — BASIC METABOLIC PANEL
BUN/Creatinine Ratio: 13 (ref 12–28)
BUN: 17 mg/dL (ref 8–27)
CO2: 24 mmol/L (ref 20–29)
Calcium: 9.9 mg/dL (ref 8.7–10.3)
Chloride: 103 mmol/L (ref 96–106)
Creatinine, Ser: 1.34 mg/dL — ABNORMAL HIGH (ref 0.57–1.00)
GFR calc Af Amer: 47 mL/min/{1.73_m2} — ABNORMAL LOW (ref >59)
GFR calc non Af Amer: 41 mL/min/{1.73_m2} — ABNORMAL LOW (ref >59)
Glucose: 89 mg/dL (ref 65–99)
Potassium: 3.2 mmol/L — ABNORMAL LOW (ref 3.5–5.2)
Sodium: 146 mmol/L — ABNORMAL HIGH (ref 134–144)

## 2020-09-22 LAB — LIPID PANEL
Chol/HDL Ratio: 2.1 ratio (ref 0.0–4.4)
Cholesterol, Total: 157 mg/dL (ref 100–199)
HDL: 76 mg/dL (ref >39)
LDL Chol Calc (NIH): 64 mg/dL (ref 0–99)
Triglycerides: 93 mg/dL (ref 0–149)
VLDL Cholesterol Cal: 17 mg/dL (ref 5–40)

## 2020-09-22 LAB — HEMOGLOBIN A1C
Est. average glucose Bld gHb Est-mCnc: 111 mg/dL
Hgb A1c MFr Bld: 5.5 % (ref 4.8–5.6)

## 2020-09-22 LAB — URIC ACID: Uric Acid: 5.9 mg/dL (ref 3.0–7.2)

## 2020-09-22 MED ORDER — POTASSIUM CHLORIDE ER 10 MEQ PO TBCR
EXTENDED_RELEASE_TABLET | ORAL | 1 refills | Status: DC
Start: 2020-09-22 — End: 2020-09-22

## 2020-09-22 MED ORDER — POTASSIUM CHLORIDE ER 10 MEQ PO TBCR
EXTENDED_RELEASE_TABLET | ORAL | 1 refills | Status: DC
Start: 2020-09-22 — End: 2021-01-19

## 2020-09-22 NOTE — Progress Notes (Signed)
Subjective:    Patient ID: Joanna [name], female    DOB: [birth year], 69 y.o.   MRN: [id number]  Hypertension This is a chronic problem. Associated symptoms include headaches. Pertinent negatives include no chest pain or shortness of breath. Risk factors for coronary artery disease include diabetes mellitus. There are no compliance problems.   Diabetes She presents for her follow-up diabetic visit. She has type 2 diabetes mellitus. Hypoglycemia symptoms include headaches. Pertinent negatives for hypoglycemia include no confusion or dizziness. There are no diabetic associated symptoms. Pertinent negatives for diabetes include no chest pain, no fatigue, no polydipsia, no polyphagia and no weakness. There are no hypoglycemic complications. There are no diabetic complications. Risk factors for coronary artery disease include hypertension. She does not see a podiatrist.Eye exam is current.      Review of Systems  Constitutional: Negative for activity change, appetite change and fatigue.  HENT: Negative for congestion and rhinorrhea.   Respiratory: Negative for cough and shortness of breath.   Cardiovascular: Negative for chest pain and leg swelling.  Gastrointestinal: Negative for abdominal pain and diarrhea.  Endocrine: Negative for polydipsia and polyphagia.  Skin: Negative for color change.  Neurological: Positive for headaches. Negative for dizziness and weakness.  Psychiatric/Behavioral: Negative for behavioral problems and confusion.       Objective:   Physical Exam Vitals reviewed.  Constitutional:      General: She is not in acute distress. HENT:     Head: Normocephalic and atraumatic.  Eyes:     General:        Right eye: No discharge.        Left eye: No discharge.  Neck:     Trachea: No tracheal deviation.  Cardiovascular:     Rate and Rhythm: Normal rate and regular rhythm.     Heart sounds: Normal heart sounds. No murmur heard.   Pulmonary:     Effort: Pulmonary  effort is normal. No respiratory distress.     Breath sounds: Normal breath sounds.  Lymphadenopathy:     Cervical: No cervical adenopathy.  Skin:    General: Skin is warm and dry.  Neurological:     Mental Status: She is alert.     Coordination: Coordination normal.  Psychiatric:        Behavior: Behavior normal.           Assessment & Plan:  1. Hypothyroidism, unspecified type Hypothyroidism under good control takes her medication on a regular basis energy level overall doing all right - Basic Metabolic Panel (BMET) - Magnesium  2. Type 2 diabetes mellitus with hemoglobin A1c goal of less than 7.5% (HCC) Her diabetes under much better control now that she is watching her diet closely.  She is also doing a good job taking her medications.  Is not had any major setbacks lab work was reviewed with the patient - Basic Metabolic Panel (BMET) - Magnesium  3. Renal insufficiency Chronic kidney fall about nephrologist.  Under good control currently.  Potassium is slightly low.  Bump up the dose of potassium to the morning 3 in the evening recheck lab work again in 1 month's time - Basic Metabolic Panel (BMET) - Magnesium  4. Essential hypertension, benign Blood pressure good control continue current measures watch diet - Basic Metabolic Panel (BMET) - Magnesium  5. Need for vaccination Pneumococcal vaccine today - Pneumococcal polysaccharide vaccine 23-valent greater than or equal to 2yo subcutaneous/IM  Left arm weakness referral to neurology apparently unable to get  her in with anyone locally therefore will go with 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

## 2020-09-23 NOTE — Addendum Note (Signed)
Addended by: [name] on: 09/23/2020 03:25 PM   Modules accepted: Orders

## 2020-09-23 NOTE — Progress Notes (Signed)
09/23/20- referral placed; pt is aware

## 2020-09-26 ENCOUNTER — Other Ambulatory Visit: Payer: Self-pay | Admitting: Family Medicine

## 2020-09-29 ENCOUNTER — Other Ambulatory Visit: Payer: Self-pay

## 2020-09-29 MED ORDER — ROSUVASTATIN CALCIUM 20 MG PO TABS: ORAL_TABLET | ORAL | 1 refills | Status: DC

## 2020-10-13 ENCOUNTER — Other Ambulatory Visit: Payer: PPO

## 2020-10-13 ENCOUNTER — Other Ambulatory Visit: Payer: Self-pay

## 2020-10-14 ENCOUNTER — Other Ambulatory Visit (HOSPITAL_COMMUNITY): Payer: Self-pay | Admitting: *Deleted

## 2020-10-14 DIAGNOSIS — E611 Iron deficiency: Secondary | ICD-10-CM

## 2020-10-14 DIAGNOSIS — D696 Thrombocytopenia, unspecified: Secondary | ICD-10-CM

## 2020-10-14 DIAGNOSIS — D649 Anemia, unspecified: Secondary | ICD-10-CM

## 2020-10-14 DIAGNOSIS — D509 Iron deficiency anemia, unspecified: Secondary | ICD-10-CM

## 2020-10-18 ENCOUNTER — Ambulatory Visit (HOSPITAL_COMMUNITY): Payer: PPO

## 2020-10-18 ENCOUNTER — Inpatient Hospital Stay (HOSPITAL_COMMUNITY): Payer: PPO | Attending: Hematology

## 2020-10-18 DIAGNOSIS — G4700 Insomnia, unspecified: Secondary | ICD-10-CM | POA: Insufficient documentation

## 2020-10-18 DIAGNOSIS — Z8249 Family history of ischemic heart disease and other diseases of the circulatory system: Secondary | ICD-10-CM | POA: Insufficient documentation

## 2020-10-18 DIAGNOSIS — G4730 Sleep apnea, unspecified: Secondary | ICD-10-CM | POA: Insufficient documentation

## 2020-10-18 DIAGNOSIS — E119 Type 2 diabetes mellitus without complications: Secondary | ICD-10-CM | POA: Insufficient documentation

## 2020-10-18 DIAGNOSIS — E785 Hyperlipidemia, unspecified: Secondary | ICD-10-CM | POA: Insufficient documentation

## 2020-10-18 DIAGNOSIS — Z90710 Acquired absence of both cervix and uterus: Secondary | ICD-10-CM | POA: Insufficient documentation

## 2020-10-18 DIAGNOSIS — D649 Anemia, unspecified: Secondary | ICD-10-CM | POA: Insufficient documentation

## 2020-10-18 DIAGNOSIS — K219 Gastro-esophageal reflux disease without esophagitis: Secondary | ICD-10-CM | POA: Insufficient documentation

## 2020-10-18 DIAGNOSIS — Z833 Family history of diabetes mellitus: Secondary | ICD-10-CM | POA: Insufficient documentation

## 2020-10-18 DIAGNOSIS — Z8051 Family history of malignant neoplasm of kidney: Secondary | ICD-10-CM | POA: Insufficient documentation

## 2020-10-18 DIAGNOSIS — Z90722 Acquired absence of ovaries, bilateral: Secondary | ICD-10-CM | POA: Insufficient documentation

## 2020-10-18 DIAGNOSIS — Z79899 Other long term (current) drug therapy: Secondary | ICD-10-CM | POA: Insufficient documentation

## 2020-10-18 DIAGNOSIS — I10 Essential (primary) hypertension: Secondary | ICD-10-CM | POA: Insufficient documentation

## 2020-10-18 DIAGNOSIS — Z9079 Acquired absence of other genital organ(s): Secondary | ICD-10-CM | POA: Insufficient documentation

## 2020-10-18 DIAGNOSIS — Z8349 Family history of other endocrine, nutritional and metabolic diseases: Secondary | ICD-10-CM | POA: Insufficient documentation

## 2020-10-18 DIAGNOSIS — E039 Hypothyroidism, unspecified: Secondary | ICD-10-CM | POA: Insufficient documentation

## 2020-10-18 DIAGNOSIS — F17210 Nicotine dependence, cigarettes, uncomplicated: Secondary | ICD-10-CM | POA: Insufficient documentation

## 2020-10-18 DIAGNOSIS — F32A Depression, unspecified: Secondary | ICD-10-CM | POA: Insufficient documentation

## 2020-10-19 ENCOUNTER — Other Ambulatory Visit: Payer: Self-pay

## 2020-10-19 ENCOUNTER — Ambulatory Visit (HOSPITAL_COMMUNITY)
Admission: RE | Admit: 2020-10-19 | Discharge: 2020-10-19 | Disposition: A | Discharge status: Home / Self Care | Payer: PPO | Source: Ambulatory Visit | Attending: Nurse Practitioner | Admitting: Nurse Practitioner

## 2020-10-19 DIAGNOSIS — R918 Other nonspecific abnormal finding of lung field: Secondary | ICD-10-CM | POA: Diagnosis not present

## 2020-10-19 DIAGNOSIS — I2510 Atherosclerotic heart disease of native coronary artery without angina pectoris: Secondary | ICD-10-CM | POA: Diagnosis not present

## 2020-10-19 DIAGNOSIS — I700 Atherosclerosis of aorta: Secondary | ICD-10-CM | POA: Diagnosis not present

## 2020-10-19 DIAGNOSIS — M47814 Spondylosis without myelopathy or radiculopathy, thoracic region: Secondary | ICD-10-CM | POA: Diagnosis not present

## 2020-10-19 DIAGNOSIS — R911 Solitary pulmonary nodule: Secondary | ICD-10-CM | POA: Diagnosis not present

## 2020-10-21 ENCOUNTER — Other Ambulatory Visit: Payer: Self-pay

## 2020-10-21 ENCOUNTER — Inpatient Hospital Stay (HOSPITAL_COMMUNITY): Payer: PPO | Admitting: Hematology

## 2020-10-21 ENCOUNTER — Inpatient Hospital Stay (HOSPITAL_COMMUNITY): Payer: PPO

## 2020-10-21 VITALS — BP 117/58 | HR 69 | Temp 98.2°F | Resp 18 | Wt 148.4 lb

## 2020-10-21 DIAGNOSIS — F17210 Nicotine dependence, cigarettes, uncomplicated: Secondary | ICD-10-CM | POA: Diagnosis not present

## 2020-10-21 DIAGNOSIS — Z833 Family history of diabetes mellitus: Secondary | ICD-10-CM | POA: Diagnosis not present

## 2020-10-21 DIAGNOSIS — Z9079 Acquired absence of other genital organ(s): Secondary | ICD-10-CM | POA: Diagnosis not present

## 2020-10-21 DIAGNOSIS — Z90710 Acquired absence of both cervix and uterus: Secondary | ICD-10-CM | POA: Diagnosis not present

## 2020-10-21 DIAGNOSIS — G4700 Insomnia, unspecified: Secondary | ICD-10-CM | POA: Diagnosis not present

## 2020-10-21 DIAGNOSIS — Z8349 Family history of other endocrine, nutritional and metabolic diseases: Secondary | ICD-10-CM | POA: Diagnosis not present

## 2020-10-21 DIAGNOSIS — E611 Iron deficiency: Secondary | ICD-10-CM

## 2020-10-21 DIAGNOSIS — D509 Iron deficiency anemia, unspecified: Secondary | ICD-10-CM | POA: Diagnosis not present

## 2020-10-21 DIAGNOSIS — E785 Hyperlipidemia, unspecified: Secondary | ICD-10-CM | POA: Diagnosis not present

## 2020-10-21 DIAGNOSIS — Z8249 Family history of ischemic heart disease and other diseases of the circulatory system: Secondary | ICD-10-CM | POA: Diagnosis not present

## 2020-10-21 DIAGNOSIS — I10 Essential (primary) hypertension: Secondary | ICD-10-CM | POA: Diagnosis not present

## 2020-10-21 DIAGNOSIS — D649 Anemia, unspecified: Secondary | ICD-10-CM | POA: Diagnosis not present

## 2020-10-21 DIAGNOSIS — F32A Depression, unspecified: Secondary | ICD-10-CM | POA: Diagnosis not present

## 2020-10-21 DIAGNOSIS — Z90722 Acquired absence of ovaries, bilateral: Secondary | ICD-10-CM | POA: Diagnosis not present

## 2020-10-21 DIAGNOSIS — K219 Gastro-esophageal reflux disease without esophagitis: Secondary | ICD-10-CM | POA: Diagnosis not present

## 2020-10-21 DIAGNOSIS — E119 Type 2 diabetes mellitus without complications: Secondary | ICD-10-CM | POA: Diagnosis not present

## 2020-10-21 DIAGNOSIS — D696 Thrombocytopenia, unspecified: Secondary | ICD-10-CM | POA: Diagnosis not present

## 2020-10-21 DIAGNOSIS — Z8051 Family history of malignant neoplasm of kidney: Secondary | ICD-10-CM | POA: Diagnosis not present

## 2020-10-21 DIAGNOSIS — Z79899 Other long term (current) drug therapy: Secondary | ICD-10-CM | POA: Diagnosis not present

## 2020-10-21 DIAGNOSIS — E039 Hypothyroidism, unspecified: Secondary | ICD-10-CM | POA: Diagnosis not present

## 2020-10-21 DIAGNOSIS — G4730 Sleep apnea, unspecified: Secondary | ICD-10-CM | POA: Diagnosis not present

## 2020-10-21 LAB — CBC WITH DIFFERENTIAL/PLATELET
Abs Immature Granulocytes: 0.02 10*3/uL (ref 0.00–0.07)
Basophils Absolute: 0.1 10*3/uL (ref 0.0–0.1)
Basophils Relative: 1 %
Eosinophils Absolute: 0.1 10*3/uL (ref 0.0–0.5)
Eosinophils Relative: 1 %
HCT: 33.9 % — ABNORMAL LOW (ref 36.0–46.0)
Hemoglobin: 10.9 g/dL — ABNORMAL LOW (ref 12.0–15.0)
Immature Granulocytes: 0 %
Lymphocytes Relative: 36 %
Lymphs Abs: 2.4 10*3/uL (ref 0.7–4.0)
MCH: 29.2 pg (ref 26.0–34.0)
MCHC: 32.2 g/dL (ref 30.0–36.0)
MCV: 90.9 fL (ref 80.0–100.0)
Monocytes Absolute: 0.4 10*3/uL (ref 0.1–1.0)
Monocytes Relative: 7 %
Neutro Abs: 3.6 10*3/uL (ref 1.7–7.7)
Neutrophils Relative %: 55 %
Platelets: 227 10*3/uL (ref 150–400)
RBC: 3.73 MIL/uL — ABNORMAL LOW (ref 3.87–5.11)
RDW: 13.7 % (ref 11.5–15.5)
WBC: 6.5 10*3/uL (ref 4.0–10.5)
nRBC: 0 % (ref 0.0–0.2)

## 2020-10-21 LAB — COMPREHENSIVE METABOLIC PANEL
ALT: 16 U/L (ref 0–44)
AST: 15 U/L (ref 15–41)
Albumin: 4 g/dL (ref 3.5–5.0)
Alkaline Phosphatase: 71 U/L (ref 38–126)
Anion gap: 11 (ref 5–15)
BUN: 25 mg/dL — ABNORMAL HIGH (ref 8–23)
CO2: 22 mmol/L (ref 22–32)
Calcium: 9.7 mg/dL (ref 8.9–10.3)
Chloride: 109 mmol/L (ref 98–111)
Creatinine, Ser: 1.68 mg/dL — ABNORMAL HIGH (ref 0.44–1.00)
GFR, Estimated: 33 mL/min — ABNORMAL LOW (ref >60)
Glucose, Bld: 105 mg/dL — ABNORMAL HIGH (ref 70–99)
Potassium: 3 mmol/L — ABNORMAL LOW (ref 3.5–5.1)
Sodium: 142 mmol/L (ref 135–145)
Total Bilirubin: 0.5 mg/dL (ref 0.3–1.2)
Total Protein: 6.8 g/dL (ref 6.5–8.1)

## 2020-10-21 LAB — FERRITIN: Ferritin: 583 ng/mL — ABNORMAL HIGH (ref 11–307)

## 2020-10-21 LAB — VITAMIN B12: Vitamin B-12: 1383 pg/mL — ABNORMAL HIGH (ref 180–914)

## 2020-10-21 LAB — IRON AND TIBC
Iron: 70 ug/dL (ref 28–170)
Saturation Ratios: 27 % (ref 10.4–31.8)
TIBC: 261 ug/dL (ref 250–450)
UIBC: 191 ug/dL

## 2020-10-21 LAB — LACTATE DEHYDROGENASE: LDH: 128 U/L (ref 98–192)

## 2020-10-21 LAB — VITAMIN D 25 HYDROXY (VIT D DEFICIENCY, FRACTURES): Vit D, 25-Hydroxy: 45.95 ng/mL (ref 30–100)

## 2020-10-21 NOTE — Progress Notes (Signed)
Joanna [name], [place] [id number]   CLINIC:  Medical Oncology/Hematology  PCP:  Joanna Drown, MD [address] [place] / [place] [address]  [phone]  REASON FOR VISIT:  Follow-up for normocytic anemia  PRIOR THERAPY: None  CURRENT THERAPY: Intermittent Feraheme last on 08/04/2020  INTERVAL HISTORY:  [name], a 70 y.o. female, returns for routine follow-up for her normocytic anemia. Joanna [name] was last seen by Joanna Finders, NP, on 07/15/2020.  Today she reports feeling okay. She denies having any nosebleeds, hematuria, melena, hematochezia, F/C, night sweats or recent infections, though she has been trying to be more active and moving around more. Her energy levels are very low. She used to have pica prior, but denies having any cravings since starting Feraheme. She continues taking folic acid daily.  She continues smoking 1/2 PPD.   REVIEW OF SYSTEMS:  Review of Systems  Constitutional: Positive for appetite change (25%) and fatigue (depleted). Negative for chills, diaphoresis, fever and unexpected weight change.  HENT:   Negative for nosebleeds.   Gastrointestinal: Negative for blood in stool.  Genitourinary: Negative for hematuria.   Musculoskeletal: Positive for myalgias (L hand swelling).  All other systems reviewed and are negative.   PAST MEDICAL/SURGICAL HISTORY:  Past Medical History:  Diagnosis Date  . 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 Bilateral headaches   . Depression   . Diabetes mellitus   . Diverticulosis   . Dyslipidemia   . GERD (gastroesophageal reflux disease)   . Hypertension   . Insomnia   . Microproteinuria   . Renal insufficiency 2010  . Right carpal tunnel syndrome 12/24/2017  . Sleep apnea   . Sleep apnea 12/25/2017   Abnormal sleep study February 2019 CPAP ordered  . Thyroid disease    hypothyroid   Past Surgical History:  Procedure Laterality  Date  . ABDOMINAL HYSTERECTOMY  1984  . BACK SURGERY    . BILATERAL OOPHORECTOMY    . BIOPSY  02/19/2020   Procedure: BIOPSY;  Surgeon: [name], MD;  Location: [facility];  Service: Endoscopy;;  gastric  . CERVICAL DISC SURGERY  01/16/2019  . COLONOSCOPY  01/29/12   Fields-2-3 hyperplastic polyps, mod internal hemorrhoids, diverticulosis throughout colon  . COLONOSCOPY N/A 07/03/2014   Dr. Oneida Alar: Hyperplastic polyps  . COLONOSCOPY N/A 06/06/2019   Dr. Oneida Alar: Diverticulosis, hemorrhoids, single polyp removed, pathology revealed lipoma.  Next colonoscopy 10 years  . ESOPHAGOGASTRODUODENOSCOPY   01/29/2012   Fields-2-3cm sliding HH, fundic gland polyps, NEGATIVE H pylori  . ESOPHAGOGASTRODUODENOSCOPY N/A 07/03/2014   Dr. Oneida Alar: gastritis and normal small bowel biopsies  . ESOPHAGOGASTRODUODENOSCOPY N/A 02/19/2020   Fields: hypertrophic folds in the stomach. benign polyp removed  . GIVENS CAPSULE STUDY N/A 10/26/2014   SMALL BOWEL AVMs  . KNEE ARTHROSCOPY Right   . POLYPECTOMY  06/06/2019   Procedure: POLYPECTOMY;  Surgeon: [name], MD;  Location: [facility];  Service: Endoscopy;;  . TOTAL ABDOMINAL HYSTERECTOMY W/ BILATERAL SALPINGOOPHORECTOMY      SOCIAL HISTORY:  Social History   Socioeconomic History  . Marital status: Divorced    Spouse name: Not on file  . Number of children: 2  . Years of education: Not on file  . Highest education level: Not on file  Occupational History  . Occupation: retired    Comment: 2014 or so  Tobacco Use  . Smoking status: Current Every Day  Smoker    Packs/day: 0.25    Years: 30.00    Pack years: 7.50    Types: Cigarettes    Last attempt to quit: 10/16/2010    Years since quitting: 10.0  . Smokeless tobacco: Former Systems developer    Quit date: 10/28/2010  Vaping Use  . Vaping Use: Never used  Substance and Sexual Activity  . Alcohol use: Yes    Comment: socially, maybe 3 times a year   . Drug use: No  . Sexual activity: Not on file   Other Topics Concern  . Not on file  Social History Narrative   Right handed    Lives alone    Caffeine- 5-6 cups per day    Social Determinants of Health   Financial Resource Strain: Not on file  Food Insecurity: Not on file  Transportation Needs: Not on file  Physical Activity: Not on file  Stress: Not on file  Social Connections: Not on file  Intimate Partner Violence: Not on file    FAMILY HISTORY:  Family History  Problem Relation Age of Onset  . Cardiomyopathy Father   . Heart failure Mother   . Hyperlipidemia Mother   . Stroke Brother   . Kidney cancer Brother   . Diabetes Paternal Uncle   . Heart disease Paternal Grandmother   . Colon cancer Neg Hx   . Colon polyps Neg Hx     CURRENT MEDICATIONS:  Current Outpatient Medications  Medication Sig Dispense Refill  . acetaminophen (TYLENOL) 500 MG tablet Take 1,000 mg by mouth every 8 (eight) hours as needed for moderate pain.     Marland Kitchen albuterol (PROVENTIL HFA;VENTOLIN HFA) 108 (90 Base) MCG/ACT inhaler Inhale 2 puffs into the lungs every 4 (four) hours as needed for wheezing or shortness of breath. 1 Inhaler 5  . allopurinol (ZYLOPRIM) 100 MG tablet TAKE 1 TABLET BY MOUTH DAILY 90 tablet 0  . amLODipine (NORVASC) 5 MG tablet Take 1 tablet (5 mg total) by mouth daily. 90 tablet 1  . colchicine 0.6 MG tablet TAKE 1 TABLET BY MOUTH TWICE DAILY AS NEEDED FOR GOUT 60 tablet 1  . Cyanocobalamin (VITAMIN B-12 PO) Take 1 tablet by mouth daily.    . ferrous sulfate 325 (65 FE) MG tablet Take 325 mg by mouth daily with breakfast.    . fexofenadine (ALLEGRA) 180 MG tablet Take 180 mg by mouth daily.    . fluticasone (FLONASE) 50 MCG/ACT nasal spray PLACE 2 SPRAYS INTO THE NOSE AS NEEDED. 16 g 5  . folic acid (FOLVITE) 1 MG tablet TAKE 1 TABLET(1 MG) BY MOUTH DAILY 30 tablet 2  . indapamide (LOZOL) 1.25 MG tablet Take 1 tablet (1.25 mg total) by mouth every morning. 90 tablet 1  . Lancets (ONETOUCH DELICA PLUS SEGBTD17O) MISC  TEST ONCE DAILY 100 each 0  . levothyroxine (SYNTHROID) 88 MCG tablet Take 1/2 tablet by mouth on Mondays and fridays and 1 tablet on all other days 90 tablet 1  . liraglutide (VICTOZA) 18 MG/3ML SOPN Inject 1.2mg  into the skin daily 18 mL 5  . losartan (COZAAR) 100 MG tablet Take 1 tablet by mouth daily .(Needs office visit) 90 tablet 0  . Magnesium 250 MG TABS Take 250 mg by mouth daily.     Marland Kitchen omeprazole (PRILOSEC) 40 MG capsule Take 1 capsule (40 mg total) by mouth daily. 90 capsule 0  . potassium chloride (KLOR-CON) 10 MEQ tablet Take 2 tablets po each morning and 3 tablet po each  evening 450 tablet 1  . rosuvastatin (CRESTOR) 20 MG tablet Take 1 tablet by mouth daily (Stop Pravastatin) 90 tablet 1  . Simethicone (GAS-X PO) Take 1 capsule by mouth as needed (gas/bloating.).     Marland Kitchen tiZANidine (ZANAFLEX) 2 MG tablet TAKE 1 TABLET BY MOUTH TWICE DAILY FOR 2 WEEKS THEN TAKE 1 TABLET BY MOUTH THREE TIMES DAILY 90 tablet 3  . topiramate (TOPAMAX) 50 MG tablet Take 1 tablet (50 mg total) by mouth 2 (two) times daily. 180 tablet 0  . traZODone (DESYREL) 50 MG tablet TAKE 1/2 TO 1 TABLET BY MOUTH EVERY NIGHT FOR SLEEP 30 tablet 2  . ONETOUCH VERIO test strip TEST ONCE DAILY 150 strip 0   No current facility-administered medications for this visit.    ALLERGIES:  Allergies  Allergen Reactions  . Ivp Dye [Iodinated Diagnostic Agents]   . Zoloft [Sertraline Hcl]     Withdrawn,bad dreams    PHYSICAL EXAM:  Performance status (ECOG): 1 - Symptomatic but completely ambulatory  Vitals:   10/21/20 1157  BP: (!) 117/58  Pulse: 69  Resp: 18  Temp: 98.2 F (36.8 C)  SpO2: 97%   Wt Readings from Last 3 Encounters:  10/21/20 148 lb 5.9 oz (67.3 kg)  09/22/20 151 lb (68.5 kg)  08/11/20 152 lb 12.8 oz (69.3 kg)   Physical Exam Vitals reviewed.  Constitutional:      Appearance: Normal appearance.  Cardiovascular:     Rate and Rhythm: Normal rate and regular rhythm.     Pulses: Normal  pulses.     Heart sounds: Normal heart sounds.  Pulmonary:     Effort: Pulmonary effort is normal.     Breath sounds: Normal breath sounds.  Abdominal:     Palpations: Abdomen is soft. There is no hepatomegaly, splenomegaly or mass.     Tenderness: There is no abdominal tenderness.     Hernia: No hernia is present.  Musculoskeletal:     Right lower leg: No edema.     Left lower leg: No edema.  Lymphadenopathy:     Lower Body: No right inguinal adenopathy. No left inguinal adenopathy.  Neurological:     General: No focal deficit present.     Mental Status: She is alert and oriented to person, place, and time.  Psychiatric:        Mood and Affect: Mood normal.        Behavior: Behavior normal.      LABORATORY DATA:  I have reviewed the labs as listed.  CBC Latest Ref Rng & Units 07/06/2020 03/03/2020 10/27/2019  WBC 4.0 - 10.5 K/uL 5.4 6.5 6.0  Hemoglobin 12.0 - 15.0 g/dL 10.5(L) 11.0(L) 11.4(L)  Hematocrit 36.0 - 46.0 % 33.5(L) 34.2(L) 36.4  Platelets 150 - 400 K/uL 244 286 300   CMP Latest Ref Rng & Units 09/21/2020 07/06/2020 06/04/2020  Glucose 65 - 99 mg/dL 89 163(H) 110(H)  BUN 8 - 27 mg/dL 17 18 19   Creatinine 0.57 - 1.00 mg/dL 1.34(H) 1.63(H) 1.68(H)  Sodium 134 - 144 mmol/L 146(H) 142 142  Potassium 3.5 - 5.2 mmol/L 3.2(L) 3.0(L) 3.3(L)  Chloride 96 - 106 mmol/L 103 106 103  CO2 20 - 29 mmol/L 24 24 23   Calcium 8.7 - 10.3 mg/dL 9.9 9.1 9.1  Total Protein 6.5 - 8.1 g/dL - 6.7 -  Total Bilirubin 0.3 - 1.2 mg/dL - 0.4 -  Alkaline Phos 38 - 126 U/L - 69 -  AST 15 - 41 U/L -  18 -  ALT 0 - 44 U/L - 12 -      Component Value Date/Time   RBC 3.64 (L) 07/06/2020 1324   MCV 92.0 07/06/2020 1324   MCV 80 11/13/2018 1205   MCH 28.8 07/06/2020 1324   MCHC 31.3 07/06/2020 1324   RDW 13.8 07/06/2020 1324   RDW 16.5 (H) 11/13/2018 1205   LYMPHSABS 1.6 07/06/2020 1324   LYMPHSABS 2.4 11/13/2018 1205   MONOABS 0.3 07/06/2020 1324   EOSABS 0.1 07/06/2020 1324   EOSABS 0.1  11/13/2018 1205   BASOSABS 0.0 07/06/2020 1324   BASOSABS 0.1 11/13/2018 1205    DIAGNOSTIC IMAGING:  I have independently reviewed the scans and discussed with the patient. CT Chest Wo Contrast  Result Date: 10/19/2020 CLINICAL DATA:  Follow-up pulmonary nodule. EXAM: CT CHEST WITHOUT CONTRAST TECHNIQUE: Multidetector CT imaging of the chest was performed following the standard protocol without IV contrast. COMPARISON:  11/07/2019 chest CT. FINDINGS: Cardiovascular: Normal heart size. No significant pericardial effusion/thickening. Three-vessel coronary atherosclerosis. Atherosclerotic nonaneurysmal thoracic aorta. Normal caliber pulmonary arteries. Mediastinum/Nodes: No discrete thyroid nodules. Unremarkable esophagus. No pathologically enlarged axillary, mediastinal or hilar lymph nodes, noting limited sensitivity for the detection of hilar adenopathy on this noncontrast study. Lungs/Pleura: No pneumothorax. No pleural effusion. No acute consolidative airspace disease or lung masses. Peripheral right lower lobe 4 mm solid pulmonary nodule (series 4/image 97), stable. No new significant pulmonary nodules. Upper abdomen: No acute abnormality. Musculoskeletal: No aggressive appearing focal osseous lesions. Partially visualized surgical hardware from ACDF. Moderate thoracic spondylosis. IMPRESSION: 1. Stable 4 mm right lower lobe solid pulmonary nodule, considered benign. No active pulmonary disease. 2. Three-vessel coronary atherosclerosis. 3. Aortic Atherosclerosis (ICD10-I70.0). Electronically Signed   By: Ilona Sorrel M.D.   On: 10/19/2020 21:03     ASSESSMENT:  1.  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 -Denies any bleeding per rectum or melena. -Last Feraheme on 08/04/2020.  2.  Smoking history: -She is a current active smoker, smokes half pack per day for 50 years. -CT CAP on 11/07/2019 showed New 4 mm right lower lobe lung  nodule is nonspecific but likely benign.  Noncontrast chest CT was recommended in 12 months.  Subjective thickening of the greater curvature of the stomach in the region of the fundus and body, exaggerated by under distention of the stomach.  She had EGD in 2016 and stomach was normal.  She does not have any abdominal pain or bloating at this time.  She did not have any weight loss in the last 6 months. -CT chest without contrast on 10/19/2020 showed stable 4 mm right lower lobe solid pulmonary nodule considered benign.  No active pulmonary disease.   PLAN:  1.  Normocytic anemia: -Last Feraheme on 08/04/2020. -Reviewed labs from today which showed ferritin 583 and percent saturation 27. -Hemoglobin is 10.9.  No parenteral iron needed. -RTC 6 months for follow-up.  2.  Folic acid deficiency: -Continue folic acid daily.  3.  Smoking history: -Reviewed CT scan results from 10/19/2020 which showed stable 4 mm right lower lobe pulmonary nodule.  No other disease. -She is continuing to smoke half pack per day. -She will need yearly low-dose CT scans.  Orders placed this encounter:  No orders of the defined types were placed in this encounter.    [name], MD [place] [phone]   I, Milinda Antis, am acting as a scribe for Dr. Dirk Dress  [place].  I, [name] MD, have reviewed the above documentation for accuracy and completeness, and I agree with the above.

## 2020-10-21 NOTE — Patient Instructions (Signed)
[place] at [facility] Discharge Instructions  You were seen today by Dr. Delton Coombes. He went over your recent results. You had labs drawn today for further analysis. If your iron levels are low, you will be scheduled for iron infusions. Dr. Delton Coombes will see you back in 6 months for labs and follow up.   Thank you for choosing [place] [facility] to provide your oncology and hematology care.  To afford each patient quality time with our provider, please arrive at least 15 minutes before your scheduled appointment time.   If you have a lab appointment with the [facility] please come in thru the Main Entrance and check in at the main information desk  You need to re-schedule your appointment should you arrive 10 or more minutes late.  We strive to give you quality time with our providers, and arriving late affects you and other patients whose appointments are after yours.  Also, if you no show three or more times for appointments you may be dismissed from the clinic at the providers discretion.     Again, thank you for choosing [facility].  Our hope is that these requests will decrease the amount of time that you wait before being seen by our physicians.       _____________________________________________________________  Should you have questions after your visit to [facility], please contact our office at [phone] between the hours of 8:00 a.m. and 4:30 p.m.  Voicemails left after 4:00 p.m. will not be returned until the following business day.  For prescription refill requests, have your pharmacy contact our office and allow 72 hours.    Cancer Center Support Programs:   > Cancer Support Group  2nd Tuesday of the month 1pm-2pm, Journey Room

## 2020-10-24 ENCOUNTER — Other Ambulatory Visit (HOSPITAL_COMMUNITY): Payer: Self-pay | Admitting: Hematology

## 2020-10-28 ENCOUNTER — Telehealth: Payer: Self-pay | Admitting: Family Medicine

## 2020-10-28 ENCOUNTER — Other Ambulatory Visit: Payer: Self-pay | Admitting: Family Medicine

## 2020-10-28 NOTE — Telephone Encounter (Signed)
6rf 

## 2020-10-28 NOTE — Telephone Encounter (Signed)
Pharmacy requesting refill on Topiramate 50 mg. Pt last seen 09/22/20 for hypothyroidism. Please advise. Thank you

## 2020-10-29 ENCOUNTER — Other Ambulatory Visit: Payer: Self-pay | Admitting: *Deleted

## 2020-10-29 MED ORDER — TOPIRAMATE 50 MG PO TABS: 50.0000 mg | ORAL_TABLET | Freq: Two times a day (BID) | ORAL | 1 refills | Status: DC

## 2020-10-29 NOTE — Telephone Encounter (Signed)
Med sent.

## 2020-11-08 ENCOUNTER — Other Ambulatory Visit: Payer: Self-pay | Admitting: Family Medicine

## 2020-11-09 ENCOUNTER — Other Ambulatory Visit: Payer: Self-pay | Admitting: *Deleted

## 2020-11-09 MED ORDER — INDAPAMIDE 1.25 MG PO TABS: 1.2500 mg | ORAL_TABLET | Freq: Every morning | ORAL | 0 refills | Status: DC

## 2020-11-09 MED ORDER — LOSARTAN POTASSIUM 100 MG PO TABS: ORAL_TABLET | ORAL | 0 refills | Status: DC

## 2020-11-22 ENCOUNTER — Other Ambulatory Visit: Payer: Self-pay

## 2020-11-22 DIAGNOSIS — E039 Hypothyroidism, unspecified: Secondary | ICD-10-CM

## 2020-11-22 MED ORDER — LEVOTHYROXINE SODIUM 88 MCG PO TABS: ORAL_TABLET | ORAL | 1 refills | Status: DC

## 2020-11-29 ENCOUNTER — Other Ambulatory Visit: Payer: Self-pay

## 2020-11-29 DIAGNOSIS — I10 Essential (primary) hypertension: Secondary | ICD-10-CM

## 2020-11-29 MED ORDER — AMLODIPINE BESYLATE 5 MG PO TABS: 5.0000 mg | ORAL_TABLET | Freq: Every day | ORAL | 1 refills | Status: DC

## 2020-12-06 ENCOUNTER — Telehealth: Payer: Self-pay | Admitting: Family Medicine

## 2020-12-06 DIAGNOSIS — N1832 Chronic kidney disease, stage 3b: Secondary | ICD-10-CM | POA: Diagnosis not present

## 2020-12-06 DIAGNOSIS — D631 Anemia in chronic kidney disease: Secondary | ICD-10-CM | POA: Diagnosis not present

## 2020-12-06 DIAGNOSIS — R911 Solitary pulmonary nodule: Secondary | ICD-10-CM | POA: Diagnosis not present

## 2020-12-06 DIAGNOSIS — N2581 Secondary hyperparathyroidism of renal origin: Secondary | ICD-10-CM | POA: Diagnosis not present

## 2020-12-06 DIAGNOSIS — E785 Hyperlipidemia, unspecified: Secondary | ICD-10-CM | POA: Diagnosis not present

## 2020-12-06 DIAGNOSIS — I129 Hypertensive chronic kidney disease with stage 1 through stage 4 chronic kidney disease, or unspecified chronic kidney disease: Secondary | ICD-10-CM | POA: Diagnosis not present

## 2020-12-06 DIAGNOSIS — I2510 Atherosclerotic heart disease of native coronary artery without angina pectoris: Secondary | ICD-10-CM | POA: Diagnosis not present

## 2020-12-06 DIAGNOSIS — E1122 Type 2 diabetes mellitus with diabetic chronic kidney disease: Secondary | ICD-10-CM | POA: Diagnosis not present

## 2020-12-06 DIAGNOSIS — Z720 Tobacco use: Secondary | ICD-10-CM | POA: Diagnosis not present

## 2020-12-06 MED ORDER — TRAZODONE HCL 50 MG PO TABS: ORAL_TABLET | ORAL | 5 refills | Status: DC

## 2020-12-06 NOTE — Telephone Encounter (Signed)
Pharmacy requesting refill on Trazodone 50 mg tablets. Take 1/2-1 tablet po every night for sleep. Please advise. Thank you

## 2020-12-06 NOTE — Telephone Encounter (Signed)
6rf 

## 2020-12-06 NOTE — Telephone Encounter (Signed)
Prescription sent electronically to pharmacy. 

## 2020-12-27 ENCOUNTER — Other Ambulatory Visit: Payer: Self-pay | Admitting: Neurology

## 2021-01-06 ENCOUNTER — Other Ambulatory Visit: Payer: Self-pay

## 2021-01-06 ENCOUNTER — Encounter: Payer: Self-pay | Admitting: Family Medicine

## 2021-01-06 ENCOUNTER — Ambulatory Visit (INDEPENDENT_AMBULATORY_CARE_PROVIDER_SITE_OTHER): Payer: PPO | Admitting: Family Medicine

## 2021-01-06 VITALS — BP 122/82 | HR 76 | Temp 98.2°F | Ht 62.0 in | Wt 142.8 lb

## 2021-01-06 DIAGNOSIS — J301 Allergic rhinitis due to pollen: Secondary | ICD-10-CM | POA: Diagnosis not present

## 2021-01-06 DIAGNOSIS — H66002 Acute suppurative otitis media without spontaneous rupture of ear drum, left ear: Secondary | ICD-10-CM | POA: Diagnosis not present

## 2021-01-06 MED ORDER — AMOXICILLIN-POT CLAVULANATE 875-125 MG PO TABS: 1.0000 | ORAL_TABLET | Freq: Two times a day (BID) | ORAL | 0 refills | Status: DC

## 2021-01-06 MED ORDER — FLUTICASONE PROPIONATE 50 MCG/ACT NA SUSP: NASAL | 5 refills | Status: DC

## 2021-01-06 NOTE — Patient Instructions (Signed)
Otitis Media, Adult  Otitis media is a condition in which the middle ear is red and swollen (inflamed) and full of fluid. The middle ear is the part of the ear that contains bones for hearing as well as air that helps send sounds to the brain. The condition usually goes away on its own. What are the causes? This condition is caused by a blockage in the eustachian tube. The eustachian tube connects the middle ear to the back of the nose. It normally allows air into the middle ear. The blockage is caused by fluid or swelling. Problems that can cause blockage include:  A cold or infection that affects the nose, mouth, or throat.  Allergies.  An irritant, such as tobacco smoke.  Adenoids that have become large. The adenoids are soft tissue located in the back of the throat, behind the nose and the roof of the mouth.  Growth or swelling in the upper part of the throat, just behind the nose (nasopharynx).  Damage to the ear caused by change in pressure. This is called barotrauma. What are the signs or symptoms? Symptoms of this condition include:  Ear pain.  Fever.  Problems with hearing.  Being tired.  Fluid leaking from the ear.  Ringing in the ear. How is this treated? This condition can go away on its own within 3-5 days. But if the condition is caused by bacteria or does not go away on its own, or if it keeps coming back, your doctor may:  Give you antibiotic medicines.  Give you medicines for pain. Follow these instructions at home:  Take over-the-counter and prescription medicines only as told by your doctor.  If you were prescribed an antibiotic medicine, take it as told by your doctor. Do not stop taking the antibiotic even if you start to feel better.  Keep all follow-up visits as told by your doctor. This is important. Contact a doctor if:  You have bleeding from your nose.  There is a lump on your neck.  You are not feeling better in 5 days.  You feel worse  instead of better. Get help right away if:  You have pain that is not helped with medicine.  You have swelling, redness, or pain around your ear.  You get a stiff neck.  You cannot move part of your face (paralysis).  You notice that the bone behind your ear hurts when you touch it.  You get a very bad headache. Summary  Otitis media means that the middle ear is red, swollen, and full of fluid.  This condition usually goes away on its own.  If the problem does not go away, treatment may be needed. You may be given medicines to treat the infection or to treat your pain.  If you were prescribed an antibiotic medicine, take it as told by your doctor. Do not stop taking the antibiotic even if you start to feel better.  Keep all follow-up visits as told by your doctor. This is important. This information is not intended to replace advice given to you by your health care provider. Make sure you discuss any questions you have with your health care provider. Document Revised: 09/04/2019 Document Reviewed: 09/04/2019 Elsevier Patient Education  2021 Elsevier Inc. How to Perform a Sinus Rinse A sinus rinse is a home treatment. It rinses your sinuses with a mixture of salt and water (saline solution). Sinuses are air-filled spaces in your skull behind the bones of your face and forehead. They   open into your nasal cavity. A sinus rinse can help to clear your nasal cavity. It can clear mucus, dirt, dust, or pollen. You may do a sinus rinse when you have:  A cold.  A virus.  Allergies.  A sinus infection.  A stuffy nose. Talk with your doctor about whether a sinus rinse might help you. What are the risks? A sinus rinse is normally very safe and helpful. However, there are a few risks. These include:  A burning feeling in the sinuses. This may happen if you do not make the saline solution as instructed. Be sure to follow all directions when making the saline solution.  Nasal  irritation.  Infection from unclean water. This is rare, but possible. Do not do a sinus rinse if you have had:  Ear or nasal surgery.  An ear infection.  Blocked ears. Supplies needed:  Saline solution or powder.  Distilled or germ-free (sterile) water may be needed to mix with saline powder. ? You may use boiled and cooled tap water. Boil tap water for 5 minutes; cool until it is lukewarm. Use within 24 hours. ? Do not use regular tap water to mix with the saline solution.  Neti pot or nasal rinse bottle. This releases the saline solution into your nose and through your sinuses. You can buy neti pots and rinse bottles: ? At your local pharmacy. ? At a health food store. ? Online. How to perform a sinus rinse 1. Wash your hands with soap and water. 2. Wash your device using the directions that came with it. 3. Dry your device. 4. Use the solution that comes with your device or one that is sold separately in stores. Follow the mixing directions on the package if you need to mix with sterile or distilled water. 5. Fill your device with the amount of saline solution stated in the device instructions. 6. Stand over a sink and tilt your head sideways over the sink. 7. Place the spout of the device in your upper nostril (the one closer to the ceiling). 8. Gently pour or squeeze the saline solution into your nasal cavity. The liquid should drain to your lower nostril if you are not too stuffed up (congested). 9. While rinsing, breathe through your open mouth. 10. Gently blow your nose to clear any mucus and rinse solution. Blowing too hard may cause ear pain. 11. Repeat in your other nostril. 12. Clean and rinse your device with clean water. 13. Air-dry your device. Talk with your doctor or pharmacist if you have questions about how to do a sinus rinse.   Summary  A sinus rinse is a home treatment. It rinses your sinuses with a mixture of salt and water (saline solution).  A sinus  rinse is normally very safe and helpful. Follow all instructions carefully.  Talk with your doctor about whether a sinus rinse might help you. This information is not intended to replace advice given to you by your health care provider. Make sure you discuss any questions you have with your health care provider. Document Revised: 07/13/2020 Document Reviewed: 07/13/2020 Elsevier Patient Education  2021 Elsevier Inc.  

## 2021-01-06 NOTE — Progress Notes (Signed)
Patient ID: Joanna [name], female    DOB: 1950/11/13, 70 y.o.   MRN: [id number]   Chief Complaint  Patient presents with  . Dizziness    This week   Subjective:  CC: feeling dizziness "feeling off"   This is a new problem.  Presents today for an acute visit with a complaint of dizziness and feeling "off.  Reports that she had symptoms similar to this 4 years ago, was found to be iron deficient, she now receives iron infusions at the cancer center.  Her last iron infusion was in October 2021.  Her last iron studies done October 21, 2020, no iron infusion needed at that time.  This is managed by the cancer center.  She is not currently feeling dizzy today, does endorse ear pain since symptoms have been present.  She denies fever, chills, chest pain, shortness of breath no bleeding.    Medical History Shuna has a past medical history of AVM (arteriovenous malformation) of small bowel, acquired (JAN 2016 [place]), Bilateral headaches, Depression, Diabetes mellitus, Diverticulosis, Dyslipidemia, GERD (gastroesophageal reflux disease), Hypertension, Insomnia, Microproteinuria, Renal insufficiency (2010), Right carpal tunnel syndrome (12/24/2017), Sleep apnea, Sleep apnea (12/25/2017), and Thyroid disease.   Outpatient Encounter Medications as of 01/06/2021  Medication Sig  . amoxicillin-clavulanate (AUGMENTIN) 875-125 MG tablet Take 1 tablet by mouth 2 (two) times daily.  Marland Kitchen acetaminophen (TYLENOL) 500 MG tablet Take 1,000 mg by mouth every 8 (eight) hours as needed for moderate pain.   Marland Kitchen albuterol (PROVENTIL HFA;VENTOLIN HFA) 108 (90 Base) MCG/ACT inhaler Inhale 2 puffs into the lungs every 4 (four) hours as needed for wheezing or shortness of breath.  . allopurinol (ZYLOPRIM) 100 MG tablet TAKE 1 TABLET BY MOUTH DAILY  . amLODipine (NORVASC) 5 MG tablet Take 1 tablet (5 mg total) by mouth daily.  . colchicine 0.6 MG tablet TAKE 1 TABLET BY MOUTH TWICE DAILY AS NEEDED FOR GOUT  . Cyanocobalamin  (VITAMIN B-12 PO) Take 1 tablet by mouth daily.  . ferrous sulfate 325 (65 FE) MG tablet Take 325 mg by mouth daily with breakfast.  . fexofenadine (ALLEGRA) 180 MG tablet Take 180 mg by mouth daily.  . fluticasone (FLONASE) 50 MCG/ACT nasal spray PLACE 2 SPRAYS INTO THE NOSE AS NEEDED.  . folic acid (FOLVITE) 1 MG tablet TAKE 1 TABLET(1 MG) BY MOUTH DAILY  . indapamide (LOZOL) 1.25 MG tablet Take 1 tablet (1.25 mg total) by mouth every morning.  Marland Kitchen levothyroxine (SYNTHROID) 88 MCG tablet Take 1/2 tablet by mouth on Mondays and fridays and 1 tablet on all other days  . liraglutide (VICTOZA) 18 MG/3ML SOPN Inject 1.2mg  into the skin daily  . losartan (COZAAR) 100 MG tablet Take 1 tablet by mouth daily .(Needs office visit)  . Magnesium 250 MG TABS Take 250 mg by mouth daily.   Marland Kitchen omeprazole (PRILOSEC) 40 MG capsule Take 1 capsule by mouth daily  . potassium chloride (KLOR-CON) 10 MEQ tablet Take 2 tablets po each morning and 3 tablet po each evening  . rosuvastatin (CRESTOR) 20 MG tablet Take 1 tablet by mouth daily (Stop Pravastatin)  . Simethicone (GAS-X PO) Take 1 capsule by mouth as needed (gas/bloating.).   Marland Kitchen tiZANidine (ZANAFLEX) 2 MG tablet TAKE 1 TABLET BY MOUTH THREE TIMES DAILY **MUST MAKE APPT FOR CONTINUED REFILLS** 12/27/20  . topiramate (TOPAMAX) 50 MG tablet Take 1 tablet (50 mg total) by mouth 2 (two) times daily.  . traZODone (DESYREL) 50 MG tablet TAKE 1/2 TO 1  TABLET BY MOUTH EVERY NIGHT FOR SLEEP  . [DISCONTINUED] fluticasone (FLONASE) 50 MCG/ACT nasal spray PLACE 2 SPRAYS INTO THE NOSE AS NEEDED.  . [DISCONTINUED] Lancets (ONETOUCH DELICA PLUS [id number]) MISC TEST ONCE DAILY  . [DISCONTINUED] ONETOUCH VERIO test strip TEST ONCE DAILY   No facility-administered encounter medications on file as of 01/06/2021.     Review of Systems  Constitutional: Negative for chills, fatigue and fever.  HENT: Positive for ear pain.        Both ears in past week.   Respiratory: Negative  for chest tightness and shortness of breath.   Cardiovascular: Negative for chest pain and leg swelling.  Gastrointestinal: Negative for abdominal pain.  Genitourinary: Negative for dysuria.  Skin: Negative for rash.  Neurological: Positive for headaches.       Reports headache everyday. (not new).      Vitals BP 122/82   Pulse 76   Temp 98.2 F (36.8 C) (Oral)   Ht 5\' 2"  (1.575 m)   Wt 142 lb 12.8 oz (64.8 kg)   SpO2 99%   BMI 26.12 kg/m   Objective:   Physical Exam Vitals reviewed.  Constitutional:      Appearance: Normal appearance.  HENT:     Right Ear: Tympanic membrane normal.     Left Ear: Tympanic membrane is erythematous.     Nose:     Right Turbinates: Swollen.     Left Turbinates: Swollen.     Right Sinus: No maxillary sinus tenderness or frontal sinus tenderness.     Left Sinus: No maxillary sinus tenderness or frontal sinus tenderness.  Cardiovascular:     Rate and Rhythm: Normal rate and regular rhythm.     Heart sounds: Normal heart sounds.  Pulmonary:     Effort: Pulmonary effort is normal.     Breath sounds: Normal breath sounds.  Skin:    General: Skin is warm and dry.  Neurological:     General: No focal deficit present.     Mental Status: She is alert.  Psychiatric:        Behavior: Behavior normal.      Assessment and Plan   1. Non-recurrent acute suppurative otitis media of left ear without spontaneous rupture of tympanic membrane - amoxicillin-clavulanate (AUGMENTIN) 875-125 MG tablet; Take 1 tablet by mouth 2 (two) times daily.  Dispense: 20 tablet; Refill: 0  2. Seasonal allergic rhinitis due to pollen - fluticasone (FLONASE) 50 MCG/ACT nasal spray; PLACE 2 SPRAYS INTO THE NOSE AS NEEDED.  Dispense: 16 g; Refill: 5   Left TM erythematous, will treat with antibiotics for 10 days.  Encouraged to continue to take her Allegra and to start using Flonase for her seasonal allergies.  Recommend sinus rinses, information given at  discharge.  Agrees with plan of care discussed today. Understands warning signs to seek further care: chest pain, shortness of breath, any significant change in health.  Understands to follow-up if symptoms do not improve, or worsen.  Has a follow-up visit with Dr. [name] scheduled in April.   Pecolia Ades, NP 01/06/21

## 2021-01-07 ENCOUNTER — Ambulatory Visit: Payer: PPO | Admitting: Family Medicine

## 2021-01-10 ENCOUNTER — Other Ambulatory Visit: Payer: Self-pay

## 2021-01-10 MED ORDER — OMEPRAZOLE 40 MG PO CPDR: 40.0000 mg | DELAYED_RELEASE_CAPSULE | Freq: Every day | ORAL | 0 refills | Status: DC

## 2021-01-13 ENCOUNTER — Other Ambulatory Visit: Payer: Self-pay

## 2021-01-13 MED ORDER — LOSARTAN POTASSIUM 100 MG PO TABS: ORAL_TABLET | ORAL | 0 refills | Status: DC

## 2021-01-13 MED ORDER — INDAPAMIDE 1.25 MG PO TABS: 1.2500 mg | ORAL_TABLET | Freq: Every morning | ORAL | 0 refills | Status: DC

## 2021-01-19 ENCOUNTER — Other Ambulatory Visit: Payer: Self-pay

## 2021-01-19 ENCOUNTER — Encounter: Payer: Self-pay | Admitting: Family Medicine

## 2021-01-19 ENCOUNTER — Ambulatory Visit (INDEPENDENT_AMBULATORY_CARE_PROVIDER_SITE_OTHER): Payer: PPO | Admitting: Family Medicine

## 2021-01-19 VITALS — BP 130/86 | Temp 97.2°F | Wt 142.2 lb

## 2021-01-19 DIAGNOSIS — I10 Essential (primary) hypertension: Secondary | ICD-10-CM

## 2021-01-19 DIAGNOSIS — E1122 Type 2 diabetes mellitus with diabetic chronic kidney disease: Secondary | ICD-10-CM

## 2021-01-19 DIAGNOSIS — R519 Headache, unspecified: Secondary | ICD-10-CM | POA: Diagnosis not present

## 2021-01-19 DIAGNOSIS — N184 Chronic kidney disease, stage 4 (severe): Secondary | ICD-10-CM | POA: Diagnosis not present

## 2021-01-19 DIAGNOSIS — E119 Type 2 diabetes mellitus without complications: Secondary | ICD-10-CM | POA: Diagnosis not present

## 2021-01-19 DIAGNOSIS — R29898 Other symptoms and signs involving the musculoskeletal system: Secondary | ICD-10-CM | POA: Diagnosis not present

## 2021-01-19 DIAGNOSIS — I700 Atherosclerosis of aorta: Secondary | ICD-10-CM

## 2021-01-19 DIAGNOSIS — N289 Disorder of kidney and ureter, unspecified: Secondary | ICD-10-CM

## 2021-01-19 DIAGNOSIS — E039 Hypothyroidism, unspecified: Secondary | ICD-10-CM | POA: Diagnosis not present

## 2021-01-19 DIAGNOSIS — G8929 Other chronic pain: Secondary | ICD-10-CM | POA: Diagnosis not present

## 2021-01-19 DIAGNOSIS — G8114 Spastic hemiplegia affecting left nondominant side: Secondary | ICD-10-CM

## 2021-01-19 MED ORDER — ROSUVASTATIN CALCIUM 20 MG PO TABS: ORAL_TABLET | ORAL | 1 refills | Status: DC

## 2021-01-19 MED ORDER — ALLOPURINOL 100 MG PO TABS
100.0000 mg | ORAL_TABLET | Freq: Every day | ORAL | 1 refills | Status: DC
Start: 2021-01-19 — End: 2021-06-25

## 2021-01-19 MED ORDER — POTASSIUM CHLORIDE ER 10 MEQ PO TBCR: EXTENDED_RELEASE_TABLET | ORAL | 1 refills | Status: DC

## 2021-01-19 MED ORDER — OMEPRAZOLE 40 MG PO CPDR: 40.0000 mg | DELAYED_RELEASE_CAPSULE | Freq: Every day | ORAL | 1 refills | Status: DC

## 2021-01-19 MED ORDER — INDAPAMIDE 1.25 MG PO TABS: 1.2500 mg | ORAL_TABLET | Freq: Every morning | ORAL | 1 refills | Status: DC

## 2021-01-19 MED ORDER — TRAZODONE HCL 50 MG PO TABS: ORAL_TABLET | ORAL | 3 refills | Status: DC

## 2021-01-19 MED ORDER — TOPIRAMATE 100 MG PO TABS: 100.0000 mg | ORAL_TABLET | Freq: Two times a day (BID) | ORAL | 1 refills | Status: DC

## 2021-01-19 MED ORDER — LOSARTAN POTASSIUM 100 MG PO TABS: ORAL_TABLET | ORAL | 1 refills | Status: DC

## 2021-01-19 NOTE — Progress Notes (Signed)
Subjective:    Patient ID: Joanna [name], female    DOB: 17-May-1951, 70 y.o.   MRN: [id number]  Diabetes She presents for her follow-up diabetic visit. She has type 2 diabetes mellitus. Hypoglycemia symptoms include headaches. There are no diabetic associated symptoms. Risk factors for coronary artery disease include hypertension. Compliance with diabetes treatment: pt having a hard time sticking finger since left hand in a splint; has applied for Freestyle Libre Glucose Reader. She does not see a podiatrist.Eye exam is current.  Hypertension This is a chronic problem. Associated symptoms include headaches. Risk factors for coronary artery disease include diabetes mellitus. There are no compliance problems.    Pt would also like provider to recheck ears. Pt does not think her ear infection has cleared up. Did complete course of antibiotic.   Fall Risk  01/06/2021 05/17/2020 11/21/2017 11/21/2017 12/14/2016  Falls in the past year? 0 0 No Yes No  Number falls in past yr: - - - 2 or more -  Injury with Fall? - - - No -  Risk for fall due to : Other (Comment) - - - -  Risk for fall due to: Comment dizziness - - - -  Follow up Falls evaluation completed Falls evaluation completed - - -    Review of Systems  Neurological: Positive for headaches.       Objective:   Physical Exam On physical exam ears are normal bilateral neck no masses lungs clear no crackles heart regular patient does have spasticity and loss of use of the left hand and limited use of the left arm       Assessment & Plan:  1. Essential hypertension, benign Blood pressure good control continue current measures stay active watch diet - Basic Metabolic Panel (BMET) - Urine Microalbumin w/creat. ratio - Hemoglobin A1c - Uric acid - TSH - T4, free  2. Type 2 diabetes mellitus with hemoglobin A1c goal of less than 7.5% (HCC) She does not check her sugars frequently because of her hand disability.  She would not qualify for  continuous monitoring We will check lab work continue current measures She will contemplate switching to a once weekly GLP-1 - Basic Metabolic Panel (BMET) - Urine Microalbumin w/creat. ratio - Hemoglobin A1c - Uric acid - TSH - T4, free - Ambulatory referral to Podiatry  3. Hypothyroidism, unspecified type Takes her medication check lab work states energy level okay - Basic Metabolic Panel (BMET) - Urine Microalbumin w/creat. ratio - Hemoglobin A1c - Uric acid - TSH - T4, free  4. Aortic atherosclerosis ([place]) Was seen on previous CT scan continue statin healthy diet  5. Chronic nonintractable headache, unspecified headache type Bump up dose of topiramate 200 mg twice daily referral back to neurology to see if they have further input  6. Left hand weakness Referral back to neurology patient interested in a follow-up visit with the neurologist as well as potentially orthotics for her hand contractures  7. 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 Type 2 diabetes mellitus with stage 4 chronic kidney disease, without long-term current use of insulin (HCC) Kidney function stable avoid excessive protein intake taken plenty of vegetables fruits water  9. Renal insufficiency See above labs ordered await results - Basic Metabolic Panel (BMET) - Urine Microalbumin w/creat. ratio - Hemoglobin A1c - Uric acid - TSH - T4, free Follow-up by September

## 2021-01-20 LAB — MICROALBUMIN / CREATININE URINE RATIO
Creatinine, Urine: 106.1 mg/dL
Microalb/Creat Ratio: 290 mg/g creat — ABNORMAL HIGH (ref 0–29)
Microalbumin, Urine: 307.8 ug/mL

## 2021-01-20 LAB — BASIC METABOLIC PANEL
BUN/Creatinine Ratio: 11 — ABNORMAL LOW (ref 12–28)
BUN: 16 mg/dL (ref 8–27)
CO2: 22 mmol/L (ref 20–29)
Calcium: 9.8 mg/dL (ref 8.7–10.3)
Chloride: 102 mmol/L (ref 96–106)
Creatinine, Ser: 1.43 mg/dL — ABNORMAL HIGH (ref 0.57–1.00)
Glucose: 81 mg/dL (ref 65–99)
Potassium: 3.1 mmol/L — ABNORMAL LOW (ref 3.5–5.2)
Sodium: 144 mmol/L (ref 134–144)
eGFR: 40 mL/min/{1.73_m2} — ABNORMAL LOW (ref >59)

## 2021-01-20 LAB — T4, FREE: Free T4: 1.4 ng/dL (ref 0.82–1.77)

## 2021-01-20 LAB — HEMOGLOBIN A1C
Est. average glucose Bld gHb Est-mCnc: 120 mg/dL
Hgb A1c MFr Bld: 5.8 % — ABNORMAL HIGH (ref 4.8–5.6)

## 2021-01-20 LAB — TSH: TSH: 10.1 u[IU]/mL — ABNORMAL HIGH (ref 0.450–4.500)

## 2021-01-20 LAB — URIC ACID: Uric Acid: 5 mg/dL (ref 3.0–7.2)

## 2021-01-21 ENCOUNTER — Other Ambulatory Visit: Payer: Self-pay

## 2021-01-21 DIAGNOSIS — E039 Hypothyroidism, unspecified: Secondary | ICD-10-CM

## 2021-01-21 DIAGNOSIS — N289 Disorder of kidney and ureter, unspecified: Secondary | ICD-10-CM

## 2021-01-21 DIAGNOSIS — R29898 Other symptoms and signs involving the musculoskeletal system: Secondary | ICD-10-CM

## 2021-01-21 MED ORDER — POTASSIUM CHLORIDE ER 10 MEQ PO TBCR: EXTENDED_RELEASE_TABLET | ORAL | 1 refills | Status: DC

## 2021-01-21 MED ORDER — LEVOTHYROXINE SODIUM 88 MCG PO TABS: 88.0000 ug | ORAL_TABLET | Freq: Every day | ORAL | 1 refills | Status: DC

## 2021-01-24 ENCOUNTER — Other Ambulatory Visit: Payer: Self-pay | Admitting: Family Medicine

## 2021-01-24 DIAGNOSIS — R29898 Other symptoms and signs involving the musculoskeletal system: Secondary | ICD-10-CM

## 2021-01-27 ENCOUNTER — Other Ambulatory Visit: Payer: Self-pay | Admitting: *Deleted

## 2021-01-27 DIAGNOSIS — E039 Hypothyroidism, unspecified: Secondary | ICD-10-CM

## 2021-01-27 MED ORDER — LEVOTHYROXINE SODIUM 88 MCG PO TABS: 88.0000 ug | ORAL_TABLET | Freq: Every day | ORAL | 1 refills | Status: DC

## 2021-01-30 ENCOUNTER — Other Ambulatory Visit (HOSPITAL_COMMUNITY): Payer: Self-pay | Admitting: Hematology

## 2021-02-23 DIAGNOSIS — M62838 Other muscle spasm: Secondary | ICD-10-CM | POA: Diagnosis not present

## 2021-02-23 DIAGNOSIS — M20032 Swan-neck deformity of left finger(s): Secondary | ICD-10-CM | POA: Diagnosis not present

## 2021-02-23 DIAGNOSIS — M79642 Pain in left hand: Secondary | ICD-10-CM | POA: Diagnosis not present

## 2021-03-08 DIAGNOSIS — M79642 Pain in left hand: Secondary | ICD-10-CM | POA: Diagnosis not present

## 2021-03-08 DIAGNOSIS — R29898 Other symptoms and signs involving the musculoskeletal system: Secondary | ICD-10-CM | POA: Diagnosis not present

## 2021-03-08 DIAGNOSIS — M62838 Other muscle spasm: Secondary | ICD-10-CM | POA: Diagnosis not present

## 2021-03-25 ENCOUNTER — Telehealth: Payer: Self-pay

## 2021-03-25 NOTE — Telephone Encounter (Signed)
Pliraglutide (VICTOZA) 18 MG/3ML SOPN t discussed with Dr Nicki Reaper when se finished hw would try her on a different medication and she thinks she needs to have her neck re x-rayed as well.   Pt call back [phone]

## 2021-03-25 NOTE — Telephone Encounter (Signed)
Please advise. Thank you

## 2021-03-27 NOTE — Telephone Encounter (Signed)
Nurses 2 items Item #1-patient currently on Victoza daily.  We discussed going to a once weekly shot.  This was on her last visit.  She would do well with Trulicity once a week or Ozempic once a week.  Please see which 1 of these are covered by her insurance. Item #2-I assume that she is stating that her hand is getting worse.  She has a history of a cervical spine myelopathy.  We can do a follow-up MRI if that is what she is asking(please see previous message stating that she needed to have her neck re x-rayed?)  But I would need to do another visit in order to document what is going on in order to get the MRI approved.  If she is interested in moving forward with the MRI please give her an appointment somewhere within the next 2 to 3 weeks-schedule very booked

## 2021-03-28 ENCOUNTER — Other Ambulatory Visit: Payer: Self-pay | Admitting: Family Medicine

## 2021-03-28 ENCOUNTER — Other Ambulatory Visit: Payer: Self-pay

## 2021-03-28 DIAGNOSIS — E119 Type 2 diabetes mellitus without complications: Secondary | ICD-10-CM

## 2021-03-28 MED ORDER — TRULICITY 0.75 MG/0.5ML ~~LOC~~ SOAJ: 0.7500 mg | SUBCUTANEOUS | 3 refills | Status: DC

## 2021-03-28 NOTE — Telephone Encounter (Signed)
Patient scheduled follow up for hand pain and [place] stated that both medications will be covered under the same tier (may be expensive but will be same cost)

## 2021-03-28 NOTE — Telephone Encounter (Signed)
Patient informed per drs medication change to trulicity once weekly.

## 2021-03-28 NOTE — Telephone Encounter (Signed)
The patient was interested in going on Trulicity because it is a once a week shot rather than a daily shot Stop Victoza Start Trulicity 3.93 mg once weekly, may send in 1 month supply with 3 refills, patient is to send [place] some readings in a couple weeks and follow-up July 5, we might increase the dose of the medicine depending on her response at that time

## 2021-03-31 ENCOUNTER — Other Ambulatory Visit: Payer: Self-pay | Admitting: *Deleted

## 2021-04-05 ENCOUNTER — Ambulatory Visit (INDEPENDENT_AMBULATORY_CARE_PROVIDER_SITE_OTHER): Payer: PPO

## 2021-04-05 ENCOUNTER — Other Ambulatory Visit: Payer: Self-pay

## 2021-04-05 DIAGNOSIS — Z780 Asymptomatic menopausal state: Secondary | ICD-10-CM | POA: Diagnosis not present

## 2021-04-05 DIAGNOSIS — Z0000 Encounter for general adult medical examination without abnormal findings: Secondary | ICD-10-CM

## 2021-04-05 DIAGNOSIS — Z1231 Encounter for screening mammogram for malignant neoplasm of breast: Secondary | ICD-10-CM

## 2021-04-05 NOTE — Progress Notes (Addendum)
Subjective:   Joanna [name] is a 70 y.o. female who presents for Medicare Annual (Subsequent) preventive examination.   I connected with Kila Mcduffey today by telephone and verified that I am speaking with the correct person using two identifiers. Location patient: home Location provider: work Persons participating in the virtual visit: patient, provider.   I discussed the limitations, risks, security and privacy concerns of performing an evaluation and management service by telephone and the availability of in person appointments. I also discussed with the patient that there may be a patient responsible charge related to this service. The patient expressed understanding and verbally consented to this telephonic visit.    Interactive audio and video telecommunications were attempted between this provider and patient, however failed, due to patient having technical difficulties OR patient did not have access to video capability.  We continued and completed visit with audio only.     Review of Systems    N/A  Cardiac Risk Factors include: advanced age (>70men, >21 women);hypertension;diabetes mellitus;dyslipidemia     Objective:    Today's Vitals   04/05/21 1307  PainSc: 10-Worst pain ever   There is no height or weight on file to calculate BMI.  Advanced Directives 04/05/2021 10/21/2020 08/04/2020 07/15/2020 03/10/2020 02/19/2020 11/17/2019  Does Patient Have a Medical Advance Directive? No No No No No No No  Would patient like information on creating a medical advance directive? No - Patient declined No - Patient declined No - Patient declined No - Patient declined No - Patient declined Yes (MAU/Ambulatory/Procedural Areas - Information given) No - Patient declined  Pre-existing out of facility DNR order (yellow form or pink MOST form) - - - - - - -    Current Medications (verified) Outpatient Encounter Medications as of 04/05/2021  Medication Sig   acetaminophen (TYLENOL) 500 MG tablet  Take 1,000 mg by mouth every 8 (eight) hours as needed for moderate pain.    albuterol (PROVENTIL HFA;VENTOLIN HFA) 108 (90 Base) MCG/ACT inhaler Inhale 2 puffs into the lungs every 4 (four) hours as needed for wheezing or shortness of breath.   allopurinol (ZYLOPRIM) 100 MG tablet Take 1 tablet (100 mg total) by mouth daily.   amLODipine (NORVASC) 5 MG tablet Take 1 tablet (5 mg total) by mouth daily.   colchicine 0.6 MG tablet TAKE 1 TABLET BY MOUTH TWICE DAILY AS NEEDED FOR GOUT   Cyanocobalamin (VITAMIN B-12 PO) Take 1 tablet by mouth daily.   Dulaglutide (TRULICITY) 6.76 PP/5.0DT SOPN Inject 0.75 mg into the skin once a week.   ferrous sulfate 325 (65 FE) MG tablet Take 325 mg by mouth daily with breakfast.   fexofenadine (ALLEGRA) 180 MG tablet Take 180 mg by mouth daily.   fluticasone (FLONASE) 50 MCG/ACT nasal spray PLACE 2 SPRAYS INTO THE NOSE AS NEEDED.   folic acid (FOLVITE) 1 MG tablet TAKE 1 TABLET(1 MG) BY MOUTH DAILY   indapamide (LOZOL) 1.25 MG tablet Take 1 tablet (1.25 mg total) by mouth every morning.   levothyroxine (SYNTHROID) 88 MCG tablet Take 1 tablet (88 mcg total) by mouth daily before breakfast.   losartan (COZAAR) 100 MG tablet Take 1 tablet by mouth daily .(Needs office visit)   Magnesium 250 MG TABS Take 250 mg by mouth daily.    omeprazole (PRILOSEC) 40 MG capsule Take 1 capsule (40 mg total) by mouth daily.   potassium chloride (KLOR-CON) 10 MEQ tablet Take 3 tablets po each morning and 3 tablet po each evening  rosuvastatin (CRESTOR) 20 MG tablet Take 1 tablet by mouth daily (Stop Pravastatin)   Simethicone (GAS-X PO) Take 1 capsule by mouth as needed (gas/bloating.).    tiZANidine (ZANAFLEX) 2 MG tablet TAKE 1 TABLET BY MOUTH THREE TIMES DAILY **MUST MAKE APPT FOR CONTINUED REFILLS** 12/27/20   topiramate (TOPAMAX) 100 MG tablet Take 1 tablet (100 mg total) by mouth 2 (two) times daily.   traZODone (DESYREL) 50 MG tablet TAKE 1/2 TO 1 TABLET BY MOUTH EVERY  NIGHT FOR SLEEP   No facility-administered encounter medications on file as of 04/05/2021.    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 [facility]   Bilateral headaches    Depression    Diabetes mellitus    Diverticulosis    Dyslipidemia    GERD (gastroesophageal reflux disease)    Hypertension    Insomnia    Microproteinuria    Renal insufficiency 2010   Right carpal tunnel syndrome 12/24/2017   Sleep apnea    Sleep apnea 12/25/2017   Abnormal sleep study February 2019 CPAP ordered   Thyroid disease    hypothyroid   Past Surgical History:  Procedure Laterality Date   ABDOMINAL HYSTERECTOMY  1984   BACK SURGERY     BILATERAL OOPHORECTOMY     BIOPSY  02/19/2020   Procedure: BIOPSY;  Surgeon: [name], MD;  Location: [facility];  Service: Endoscopy;;  gastric   CERVICAL DISC SURGERY  01/16/2019   COLONOSCOPY  01/29/12   Fields-2-3 hyperplastic polyps, mod internal hemorrhoids, diverticulosis throughout colon   COLONOSCOPY N/A 07/03/2014   Dr. Oneida Alar: Hyperplastic polyps   COLONOSCOPY N/A 06/06/2019   Dr. Oneida Alar: Diverticulosis, hemorrhoids, single polyp removed, pathology revealed lipoma.  Next colonoscopy 10 years   ESOPHAGOGASTRODUODENOSCOPY   01/29/2012   Fields-2-3cm sliding HH, fundic gland polyps, NEGATIVE H pylori   ESOPHAGOGASTRODUODENOSCOPY N/A 07/03/2014   Dr. Oneida Alar: gastritis and normal small bowel biopsies   ESOPHAGOGASTRODUODENOSCOPY N/A 02/19/2020   Fields: hypertrophic folds in the stomach. benign polyp removed   [facility] CAPSULE STUDY N/A 10/26/2014   SMALL BOWEL AVMs   KNEE ARTHROSCOPY Right    POLYPECTOMY  06/06/2019   Procedure: POLYPECTOMY;  Surgeon: [name], MD;  Location: [facility];  Service: Endoscopy;;   TOTAL ABDOMINAL HYSTERECTOMY W/ BILATERAL SALPINGOOPHORECTOMY     Family  History  Problem Relation Age of Onset   Cardiomyopathy Father    Heart failure Mother    Hyperlipidemia Mother    Stroke Brother    Kidney cancer Brother    Diabetes Paternal Uncle    Heart disease Paternal Grandmother    Colon cancer Neg Hx    Colon polyps Neg Hx    Social History   Socioeconomic History   Marital status: Divorced    Spouse name: Not on file   Number of children: 2   Years of education: Not on file   Highest education level: Not on file  Occupational History   Occupation: retired    Comment: 2014 or so  Tobacco Use   Smoking status: Every Day    Packs/day: 0.25    Years: 30.00    Pack years: 7.50    Types: Cigarettes    Last attempt to quit: 10/16/2010    Years since quitting: 10.4   Smokeless tobacco: Former    Quit date: 10/28/2010  Vaping  Use   Vaping Use: Never used  Substance and Sexual Activity   Alcohol use: Yes    Comment: socially, maybe 3 times a year    Drug use: No   Sexual activity: Not on file  Other Topics Concern   Not on file  Social History Narrative   Right handed    Lives alone    Caffeine- 5-6 cups per day    Social Determinants of Health   Financial Resource Strain: Low Risk    Difficulty of Paying Living Expenses: Not hard at all  Food Insecurity: No Food Insecurity   Worried About Charity fundraiser in the Last Year: Never true   Ran Out of Food in the Last Year: Never true  Transportation Needs: No Transportation Needs   Lack of Transportation (Medical): No   Lack of Transportation (Non-Medical): No  Physical Activity: Inactive   Days of Exercise per Week: 0 days   Minutes of Exercise per Session: 0 min  Stress: No Stress Concern Present   Feeling of Stress : Not at all  Social Connections: Socially Isolated   Frequency of Communication with Friends and Family: More than three times a week   Frequency of Social Gatherings with Friends and Family: More than three times a week   Attends Religious Services: Never    Marine scientist or Organizations: No   Attends Music therapist: Never   Marital Status: Divorced    Tobacco Counseling Ready to quit: Not Answered Counseling given: Not Answered   Clinical Intake:  Pre-visit preparation completed: Yes  Pain : 0-10 Pain Score: 10-Worst pain ever Pain Type: Chronic pain Pain Location: Hand Pain Orientation: Left Pain Descriptors / Indicators: Aching Pain Onset: More than a month ago Pain Frequency: Constant     Nutritional Risks: Unintentional weight loss Diabetes: Yes CBG done?: No Did pt. bring in CBG monitor from home?: No  How often do you need to have someone help you when you read instructions, pamphlets, or other written materials from your doctor or pharmacy?: 1 - Never  Diabetic?Yes Nutrition Risk Assessment:  Has the patient had any N/V/D within the last 2 months?  No  Does the patient have any non-healing wounds?  No  Has the patient had any unintentional weight loss or weight gain?  Yes   Diabetes:  Is the patient diabetic?  Yes  If diabetic, was a CBG obtained today?  No  Did the patient bring in their glucometer from home?  No  How often do you monitor your CBG's? Patient states she does not check her glucose.   Financial Strains and Diabetes Management:  Are you having any financial strains with the device, your supplies or your medication? No .  Does the patient want to be seen by Chronic Care Management for management of their diabetes?  No  Would the patient like to be referred to a Nutritionist or for Diabetic Management?  No   Diabetic Exams:  Diabetic Eye Exam: Completed 08/30/2020 Diabetic Foot Exam: Completed 05/17/2020      Information entered by :: [place] of Daily Living In your present state of health, do you have any difficulty performing the following activities: 04/05/2021  Hearing? N  Vision? N  Difficulty concentrating or making decisions? N   Walking or climbing stairs? N  Dressing or bathing? N  Doing errands, shopping? N  Preparing Food and eating ? N  Using the Toilet? N  In  the past six months, have you accidently leaked urine? N  Do you have problems with loss of bowel control? N  Managing your Medications? N  Managing your Finances? N  Housekeeping or managing your Housekeeping? N  Some recent data might be hidden    Patient Care Team: [name], MD as PCP - General (Family Medicine) [name], MD (Inactive) (Gastroenterology) Essenmacher, Clarene Duke, OT as Occupational Therapist (Occupational Therapy) Dagger, Orie Fisherman, OT (Inactive) as Occupational Therapist (Occupational Therapy)  Indicate any recent Medical Services you may have received from other than [facility] providers in the past year (date may be approximate).     Assessment:   This is a routine wellness examination for Bonnye.  Hearing/Vision screen Vision Screening - Comments:: Patient states she gets her eyes examined once per year. Currently wears glasses   Dietary issues and exercise activities discussed: Current Exercise Habits: The patient does not participate in regular exercise at present, Exercise limited by: None identified   Goals Addressed             This Visit's Progress    Prevent falls         Depression Screen PHQ 2/9 Scores 04/05/2021 09/22/2020 05/16/2018 11/21/2017 12/14/2016 07/26/2015 09/03/2014  PHQ - 2 Score 0 0 2 0 4 0 0  PHQ- 9 Score - - 6 - 12 - -    Fall Risk Fall Risk  04/05/2021 01/06/2021 05/17/2020 11/21/2017 11/21/2017  Falls in the past year? 1 0 0 No Yes  Number falls in past yr: 0 - - - 2 or more  Injury with Fall? 0 - - - No  Risk for fall due to : Impaired balance/gait Other (Comment) - - -  Risk for fall due to: Comment - dizziness - - -  Follow up Falls evaluation completed;Falls prevention discussed Falls evaluation completed Falls evaluation completed - -    FALL RISK PREVENTION PERTAINING TO THE  HOME:  Any stairs in or around the home? No  If so, are there any without handrails? No  Home free of loose throw rugs in walkways, pet beds, electrical cords, etc? Yes  Adequate lighting in your home to reduce risk of falls? Yes   ASSISTIVE DEVICES UTILIZED TO PREVENT FALLS:  Life alert? No  Use of a cane, walker or w/c? No  Grab bars in the bathroom? No  Shower chair or bench in shower? Yes  Elevated toilet seat or a handicapped toilet? No    Cognitive Function:  Normal cognitive status assessed by direct observation by this Nurse Health Advisor. No abnormalities found.        Immunizations Immunization History  Administered Date(s) Administered   Fluad Quad(high Dose 65+) 08/11/2020   Influenza,inj,Quad PF,6+ Mos 07/07/2013, 08/06/2014, 07/26/2015, 09/05/2016, 06/21/2017, 06/26/2018, 07/02/2019   Influenza,inj,quad, With Preservative 07/16/2018   Moderna Sars-Covid-2 Vaccination 11/03/2019, 12/01/2019, 09/01/2020   Pneumococcal Conjugate-13 08/21/2017   Pneumococcal Polysaccharide-23 02/02/2014, 09/22/2020   Zoster Recombinat (Shingrix) 07/01/2016, 12/10/2017    TDAP status: Due, Education has been provided regarding the importance of this vaccine. Advised may receive this vaccine at local pharmacy or Health Dept. Aware to provide a copy of the vaccination record if obtained from local pharmacy or Health Dept. Verbalized acceptance and understanding.  Flu Vaccine status: Up to date  Pneumococcal vaccine status: Up to date  Covid-19 vaccine status: Completed vaccines  Qualifies for Shingles Vaccine? Yes   Zostavax completed No   Shingrix Completed?: Yes  Screening Tests Health  Maintenance  Topic Date Due   TETANUS/TDAP  Never done   COVID-19 Vaccine (4 - Booster for Moderna series) 12/02/2020   MAMMOGRAM  03/16/2021   INFLUENZA VACCINE  05/16/2021   FOOT EXAM  05/17/2021   HEMOGLOBIN A1C  07/21/2021   OPHTHALMOLOGY EXAM  08/30/2021   COLONOSCOPY (Pts 45-7yrs  Insurance coverage will need to be confirmed)  06/05/2029   DEXA SCAN  Completed   Hepatitis C Screening  Completed   PNA vac Low Risk Adult  Completed   Zoster Vaccines- Shingrix  Completed   HPV VACCINES  Aged Out    Health Maintenance  Health Maintenance Due  Topic Date Due   TETANUS/TDAP  Never done   COVID-19 Vaccine (4 - Booster for Moderna series) 12/02/2020   MAMMOGRAM  03/16/2021    Colorectal cancer screening: Type of screening: Colonoscopy. Completed 06/06/2019. Repeat every 10 years  Mammogram status: Ordered 04/05/2021. Pt provided with contact info and advised to call to schedule appt.   Bone Density status: Ordered 04/05/2021. Pt provided with contact info and advised to call to schedule appt.  Lung Cancer Screening: (Low Dose CT Chest recommended if Age 28-80 years, 30 pack-year currently smoking OR have quit w/in 15years.) does not qualify.   Lung Cancer Screening Referral: N/A   Additional Screening:  Hepatitis C Screening: does qualify; Completed 12/25/2018  Vision Screening: Recommended annual ophthalmology exams for early detection of glaucoma and other disorders of the eye. Is the patient up to date with their annual eye exam?  Yes  Who is the provider or what is the name of the office in which the patient attends annual eye exams? [facility]  If pt is not established with a provider, would they like to be referred to a provider to establish care? No .   Dental Screening: Recommended annual dental exams for proper oral hygiene  Community Resource Referral / Chronic Care Management: CRR required this visit?  No   CCM required this visit?  No      Plan:     I have personally reviewed and noted the following in the patient's chart:   Medical and social history Use of alcohol, tobacco or illicit drugs  Current medications and supplements including opioid prescriptions.  Functional ability and status Nutritional status Physical  activity Advanced directives List of other physicians Hospitalizations, surgeries, and ER visits in previous 12 months Vitals Screenings to include cognitive, depression, and falls Referrals and appointments  In addition, I have reviewed and discussed with patient certain preventive protocols, quality metrics, and best practice recommendations. A written personalized care plan for preventive services as well as general preventive health recommendations were provided to patient.     Ofilia Neas, LPN   7/35/3299   Nurse Notes: None   I have reviewed and agree with the above annual wellness visit documentation [name] MD [facility]

## 2021-04-05 NOTE — Patient Instructions (Signed)
[name] , Thank you for taking time to come for your Medicare Wellness Visit. I appreciate your ongoing commitment to your health goals. Please review the following plan we discussed and let me know if I can assist you in the future.   Screening recommendations/referrals: Colonoscopy: Up to date, next due 06/06/2019 Mammogram: Currently due, orders placed this visit Bone Density:  Currently due, orders placed this visit Recommended yearly ophthalmology/optometry visit for glaucoma screening and checkup Recommended yearly dental visit for hygiene and checkup  Vaccinations: Influenza vaccine: Up to date, next due fall 2022  Pneumococcal vaccine: Completed series  Tdap vaccine: Currently due, you may await and injury to receive  Shingles vaccine: Completed series     Advanced directives: Advance directive discussed with you today. Even though you declined this today please call our office should you change your mind and we can give you the proper paperwork for you to fill out.   Conditions/risks identified: None   Next appointment: None    Preventive Care 65 Years and Older, Female Preventive care refers to lifestyle choices and visits with your health care provider that can promote health and wellness. What does preventive care include? A yearly physical exam. This is also called an annual well check. Dental exams once or twice a year. Routine eye exams. Ask your health care provider how often you should have your eyes checked. Personal lifestyle choices, including: Daily care of your teeth and gums. Regular physical activity. Eating a healthy diet. Avoiding tobacco and drug use. Limiting alcohol use. Practicing safe sex. Taking low-dose aspirin every day. Taking vitamin and mineral supplements as recommended by your health care provider. What happens during an annual well check? The services and screenings done by your health care provider during your annual well check will  depend on your age, overall health, lifestyle risk factors, and family history of disease. Counseling  Your health care provider may ask you questions about your: Alcohol use. Tobacco use. Drug use. Emotional well-being. Home and relationship well-being. Sexual activity. Eating habits. History of falls. Memory and ability to understand (cognition). Work and work Statistician. Reproductive health. Screening  You may have the following tests or measurements: Height, weight, and BMI. Blood pressure. Lipid and cholesterol levels. These may be checked every 5 years, or more frequently if you are over 36 years old. Skin check. Lung cancer screening. You may have this screening every year starting at age 38 if you have a 30-pack-year history of smoking and currently smoke or have quit within the past 15 years. Fecal occult blood test (FOBT) of the stool. You may have this test every year starting at age 37. Flexible sigmoidoscopy or colonoscopy. You may have a sigmoidoscopy every 5 years or a colonoscopy every 10 years starting at age 76. Hepatitis C blood test. Hepatitis B blood test. Sexually transmitted disease (STD) testing. Diabetes screening. This is done by checking your blood sugar (glucose) after you have not eaten for a while (fasting). You may have this done every 1-3 years. Bone density scan. This is done to screen for osteoporosis. You may have this done starting at age 38. Mammogram. This may be done every 1-2 years. Talk to your health care provider about how often you should have regular mammograms. Talk with your health care provider about your test results, treatment options, and if necessary, the need for more tests. Vaccines  Your health care provider may recommend certain vaccines, such as: Influenza vaccine. This is recommended every year.  Tetanus, diphtheria, and acellular pertussis (Tdap, Td) vaccine. You may need a Td booster every 10 years. Zoster vaccine. You may  need this after age 49. Pneumococcal 13-valent conjugate (PCV13) vaccine. One dose is recommended after age 40. Pneumococcal polysaccharide (PPSV23) vaccine. One dose is recommended after age 76. Talk to your health care provider about which screenings and vaccines you need and how often you need them. This information is not intended to replace advice given to you by your health care provider. Make sure you discuss any questions you have with your health care provider. Document Released: 10/29/2015 Document Revised: 06/21/2016 Document Reviewed: 08/03/2015 Elsevier Interactive Patient Education  2017 [employer] Prevention in the Home Falls can cause injuries. They can happen to people of all ages. There are many things you can do to make your home safe and to help prevent falls. What can I do on the outside of my home? Regularly fix the edges of walkways and driveways and fix any cracks. Remove anything that might make you trip as you walk through a door, such as a raised step or threshold. Trim any bushes or trees on the path to your home. Use bright outdoor lighting. Clear any walking paths of anything that might make someone trip, such as rocks or tools. Regularly check to see if handrails are loose or broken. Make sure that both sides of any steps have handrails. Any raised decks and porches should have guardrails on the edges. Have any leaves, snow, or ice cleared regularly. Use sand or salt on walking paths during winter. Clean up any spills in your garage right away. This includes oil or grease spills. What can I do in the bathroom? Use night lights. Install grab bars by the toilet and in the tub and shower. Do not use towel bars as grab bars. Use non-skid mats or decals in the tub or shower. If you need to sit down in the shower, use a plastic, non-slip stool. Keep the floor dry. Clean up any water that spills on the floor as soon as it happens. Remove soap buildup in  the tub or shower regularly. Attach bath mats securely with double-sided non-slip rug tape. Do not have throw rugs and other things on the floor that can make you trip. What can I do in the bedroom? Use night lights. Make sure that you have a light by your bed that is easy to reach. Do not use any sheets or blankets that are too big for your bed. They should not hang down onto the floor. Have a firm chair that has side arms. You can use this for support while you get dressed. Do not have throw rugs and other things on the floor that can make you trip. What can I do in the kitchen? Clean up any spills right away. Avoid walking on wet floors. Keep items that you use a lot in easy-to-reach places. If you need to reach something above you, use a strong step stool that has a grab bar. Keep electrical cords out of the way. Do not use floor polish or wax that makes floors slippery. If you must use wax, use non-skid floor wax. Do not have throw rugs and other things on the floor that can make you trip. What can I do with my stairs? Do not leave any items on the stairs. Make sure that there are handrails on both sides of the stairs and use them. Fix handrails that are broken or loose.  Make sure that handrails are as long as the stairways. Check any carpeting to make sure that it is firmly attached to the stairs. Fix any carpet that is loose or worn. Avoid having throw rugs at the top or bottom of the stairs. If you do have throw rugs, attach them to the floor with carpet tape. Make sure that you have a light switch at the top of the stairs and the bottom of the stairs. If you do not have them, ask someone to add them for you. What else can I do to help prevent falls? Wear shoes that: Do not have high heels. Have rubber bottoms. Are comfortable and fit you well. Are closed at the toe. Do not wear sandals. If you use a stepladder: Make sure that it is fully opened. Do not climb a closed  stepladder. Make sure that both sides of the stepladder are locked into place. Ask someone to hold it for you, if possible. Clearly mark and make sure that you can see: Any grab bars or handrails. First and last steps. Where the edge of each step is. Use tools that help you move around (mobility aids) if they are needed. These include: Canes. Walkers. Scooters. Crutches. Turn on the lights when you go into a dark area. Replace any light bulbs as soon as they burn out. Set up your furniture so you have a clear path. Avoid moving your furniture around. If any of your floors are uneven, fix them. If there are any pets around you, be aware of where they are. Review your medicines with your doctor. Some medicines can make you feel dizzy. This can increase your chance of falling. Ask your doctor what other things that you can do to help prevent falls. This information is not intended to replace advice given to you by your health care provider. Make sure you discuss any questions you have with your health care provider. Document Released: 07/29/2009 Document Revised: 03/09/2016 Document Reviewed: 11/06/2014 Elsevier Interactive Patient Education  2017 [employer].

## 2021-04-11 ENCOUNTER — Other Ambulatory Visit: Payer: Self-pay | Admitting: *Deleted

## 2021-04-11 DIAGNOSIS — I10 Essential (primary) hypertension: Secondary | ICD-10-CM

## 2021-04-11 MED ORDER — AMLODIPINE BESYLATE 5 MG PO TABS: 5.0000 mg | ORAL_TABLET | Freq: Every day | ORAL | 0 refills | Status: DC

## 2021-04-19 ENCOUNTER — Emergency Department (HOSPITAL_COMMUNITY): Payer: PPO

## 2021-04-19 ENCOUNTER — Inpatient Hospital Stay (HOSPITAL_COMMUNITY)
Admission: EM | Admit: 2021-04-19 | Discharge: 2021-04-21 | DRG: 641 | Disposition: A | Discharge status: Home / Self Care | Payer: PPO | Attending: Internal Medicine | Admitting: Internal Medicine

## 2021-04-19 ENCOUNTER — Observation Stay (HOSPITAL_COMMUNITY): Payer: PPO

## 2021-04-19 ENCOUNTER — Encounter (HOSPITAL_COMMUNITY): Payer: Self-pay

## 2021-04-19 ENCOUNTER — Other Ambulatory Visit: Payer: Self-pay

## 2021-04-19 ENCOUNTER — Ambulatory Visit: Payer: PPO | Admitting: Family Medicine

## 2021-04-19 DIAGNOSIS — K5790 Diverticulosis of intestine, part unspecified, without perforation or abscess without bleeding: Secondary | ICD-10-CM | POA: Diagnosis not present

## 2021-04-19 DIAGNOSIS — Z79899 Other long term (current) drug therapy: Secondary | ICD-10-CM

## 2021-04-19 DIAGNOSIS — I10 Essential (primary) hypertension: Secondary | ICD-10-CM | POA: Diagnosis present

## 2021-04-19 DIAGNOSIS — Z888 Allergy status to other drugs, medicaments and biological substances status: Secondary | ICD-10-CM

## 2021-04-19 DIAGNOSIS — Z20822 Contact with and (suspected) exposure to covid-19: Secondary | ICD-10-CM | POA: Diagnosis not present

## 2021-04-19 DIAGNOSIS — R42 Dizziness and giddiness: Secondary | ICD-10-CM

## 2021-04-19 DIAGNOSIS — Z8051 Family history of malignant neoplasm of kidney: Secondary | ICD-10-CM

## 2021-04-19 DIAGNOSIS — R55 Syncope and collapse: Secondary | ICD-10-CM | POA: Diagnosis not present

## 2021-04-19 DIAGNOSIS — I6523 Occlusion and stenosis of bilateral carotid arteries: Secondary | ICD-10-CM | POA: Diagnosis not present

## 2021-04-19 DIAGNOSIS — K219 Gastro-esophageal reflux disease without esophagitis: Secondary | ICD-10-CM | POA: Diagnosis not present

## 2021-04-19 DIAGNOSIS — I129 Hypertensive chronic kidney disease with stage 1 through stage 4 chronic kidney disease, or unspecified chronic kidney disease: Secondary | ICD-10-CM | POA: Diagnosis not present

## 2021-04-19 DIAGNOSIS — E1122 Type 2 diabetes mellitus with diabetic chronic kidney disease: Secondary | ICD-10-CM | POA: Diagnosis present

## 2021-04-19 DIAGNOSIS — Z833 Family history of diabetes mellitus: Secondary | ICD-10-CM | POA: Diagnosis not present

## 2021-04-19 DIAGNOSIS — R260 Ataxic gait: Secondary | ICD-10-CM | POA: Diagnosis not present

## 2021-04-19 DIAGNOSIS — E860 Dehydration: Principal | ICD-10-CM | POA: Diagnosis present

## 2021-04-19 DIAGNOSIS — Z79890 Hormone replacement therapy (postmenopausal): Secondary | ICD-10-CM | POA: Diagnosis not present

## 2021-04-19 DIAGNOSIS — E876 Hypokalemia: Secondary | ICD-10-CM | POA: Diagnosis not present

## 2021-04-19 DIAGNOSIS — E119 Type 2 diabetes mellitus without complications: Secondary | ICD-10-CM | POA: Diagnosis present

## 2021-04-19 DIAGNOSIS — F17210 Nicotine dependence, cigarettes, uncomplicated: Secondary | ICD-10-CM | POA: Diagnosis present

## 2021-04-19 DIAGNOSIS — Z83438 Family history of other disorder of lipoprotein metabolism and other lipidemia: Secondary | ICD-10-CM

## 2021-04-19 DIAGNOSIS — Z91041 Radiographic dye allergy status: Secondary | ICD-10-CM | POA: Diagnosis not present

## 2021-04-19 DIAGNOSIS — N184 Chronic kidney disease, stage 4 (severe): Secondary | ICD-10-CM | POA: Diagnosis not present

## 2021-04-19 DIAGNOSIS — R519 Headache, unspecified: Secondary | ICD-10-CM | POA: Diagnosis not present

## 2021-04-19 DIAGNOSIS — R9431 Abnormal electrocardiogram [ECG] [EKG]: Secondary | ICD-10-CM | POA: Diagnosis not present

## 2021-04-19 DIAGNOSIS — G8114 Spastic hemiplegia affecting left nondominant side: Secondary | ICD-10-CM | POA: Diagnosis not present

## 2021-04-19 DIAGNOSIS — E785 Hyperlipidemia, unspecified: Secondary | ICD-10-CM | POA: Diagnosis not present

## 2021-04-19 DIAGNOSIS — Z8249 Family history of ischemic heart disease and other diseases of the circulatory system: Secondary | ICD-10-CM | POA: Diagnosis not present

## 2021-04-19 DIAGNOSIS — Z823 Family history of stroke: Secondary | ICD-10-CM

## 2021-04-19 DIAGNOSIS — E039 Hypothyroidism, unspecified: Secondary | ICD-10-CM | POA: Diagnosis present

## 2021-04-19 DIAGNOSIS — G4730 Sleep apnea, unspecified: Secondary | ICD-10-CM | POA: Diagnosis not present

## 2021-04-19 LAB — CBC WITH DIFFERENTIAL/PLATELET
Abs Immature Granulocytes: 0.05 10*3/uL (ref 0.00–0.07)
Basophils Absolute: 0 10*3/uL (ref 0.0–0.1)
Basophils Relative: 0 %
Eosinophils Absolute: 0 10*3/uL (ref 0.0–0.5)
Eosinophils Relative: 0 %
HCT: 32.8 % — ABNORMAL LOW (ref 36.0–46.0)
Hemoglobin: 10.6 g/dL — ABNORMAL LOW (ref 12.0–15.0)
Immature Granulocytes: 1 %
Lymphocytes Relative: 16 %
Lymphs Abs: 1.3 10*3/uL (ref 0.7–4.0)
MCH: 29.2 pg (ref 26.0–34.0)
MCHC: 32.3 g/dL (ref 30.0–36.0)
MCV: 90.4 fL (ref 80.0–100.0)
Monocytes Absolute: 0.6 10*3/uL (ref 0.1–1.0)
Monocytes Relative: 7 %
Neutro Abs: 6.3 10*3/uL (ref 1.7–7.7)
Neutrophils Relative %: 76 %
Platelets: 181 10*3/uL (ref 150–400)
RBC: 3.63 MIL/uL — ABNORMAL LOW (ref 3.87–5.11)
RDW: 13.3 % (ref 11.5–15.5)
WBC: 8.3 10*3/uL (ref 4.0–10.5)
nRBC: 0 % (ref 0.0–0.2)

## 2021-04-19 LAB — RENAL FUNCTION PANEL
Albumin: 3.7 g/dL (ref 3.5–5.0)
Anion gap: 12 (ref 5–15)
BUN: 23 mg/dL (ref 8–23)
CO2: 23 mmol/L (ref 22–32)
Calcium: 8.3 mg/dL — ABNORMAL LOW (ref 8.9–10.3)
Chloride: 106 mmol/L (ref 98–111)
Creatinine, Ser: 1.79 mg/dL — ABNORMAL HIGH (ref 0.44–1.00)
GFR, Estimated: 30 mL/min — ABNORMAL LOW (ref >60)
Glucose, Bld: 93 mg/dL (ref 70–99)
Phosphorus: 3.2 mg/dL (ref 2.5–4.6)
Potassium: 2.5 mmol/L — CL (ref 3.5–5.1)
Sodium: 141 mmol/L (ref 135–145)

## 2021-04-19 LAB — ECHOCARDIOGRAM COMPLETE
AR max vel: 2.83 cm2
AV Area VTI: 2.8 cm2
AV Area mean vel: 2.84 cm2
AV Mean grad: 3 mmHg
AV Peak grad: 8.2 mmHg
Ao pk vel: 1.43 m/s
Area-P 1/2: 3.61 cm2
Height: 62 in
MV VTI: 2 cm2
S' Lateral: 2.9 cm
Weight: 2080 oz

## 2021-04-19 LAB — BASIC METABOLIC PANEL
Anion gap: 12 (ref 5–15)
BUN: 23 mg/dL (ref 8–23)
CO2: 23 mmol/L (ref 22–32)
Calcium: 8.2 mg/dL — ABNORMAL LOW (ref 8.9–10.3)
Chloride: 105 mmol/L (ref 98–111)
Creatinine, Ser: 1.7 mg/dL — ABNORMAL HIGH (ref 0.44–1.00)
GFR, Estimated: 32 mL/min — ABNORMAL LOW (ref >60)
Glucose, Bld: 94 mg/dL (ref 70–99)
Potassium: 2.4 mmol/L — CL (ref 3.5–5.1)
Sodium: 140 mmol/L (ref 135–145)

## 2021-04-19 LAB — TSH: TSH: 0.389 u[IU]/mL (ref 0.350–4.500)

## 2021-04-19 LAB — TROPONIN I (HIGH SENSITIVITY)
Troponin I (High Sensitivity): <2 ng/L (ref <18)
Troponin I (High Sensitivity): <2 ng/L (ref <18)

## 2021-04-19 LAB — RESP PANEL BY RT-PCR (FLU A&B, COVID) ARPGX2
Influenza A by PCR: NEGATIVE
Influenza B by PCR: NEGATIVE
SARS Coronavirus 2 by RT PCR: NEGATIVE

## 2021-04-19 LAB — MAGNESIUM: Magnesium: 0.4 mg/dL — CL (ref 1.7–2.4)

## 2021-04-19 MED ORDER — BISACODYL 10 MG RE SUPP: 10.0000 mg | Freq: Every day | RECTAL | Status: DC | PRN

## 2021-04-19 MED ORDER — SODIUM CHLORIDE 0.9% FLUSH
3.0000 mL | Freq: Two times a day (BID) | INTRAVENOUS | Status: DC
  Administered 2021-04-19 – 2021-04-20 (×2): 3 mL via INTRAVENOUS

## 2021-04-19 MED ORDER — MAGNESIUM SULFATE 2 GM/50ML IV SOLN
2.0000 g | Freq: Once | INTRAVENOUS | Status: AC
  Administered 2021-04-19: 2 g via INTRAVENOUS
  Filled 2021-04-19: qty 50

## 2021-04-19 MED ORDER — ACETAMINOPHEN 650 MG RE SUPP: 650.0000 mg | Freq: Four times a day (QID) | RECTAL | Status: DC | PRN

## 2021-04-19 MED ORDER — SODIUM CHLORIDE 0.9% FLUSH
3.0000 mL | Freq: Two times a day (BID) | INTRAVENOUS | Status: DC
  Administered 2021-04-19 – 2021-04-20 (×3): 3 mL via INTRAVENOUS

## 2021-04-19 MED ORDER — TOPIRAMATE 100 MG PO TABS
100.0000 mg | ORAL_TABLET | Freq: Two times a day (BID) | ORAL | Status: DC
  Administered 2021-04-19 – 2021-04-21 (×5): 100 mg via ORAL
  Filled 2021-04-19 (×3): qty 1
  Filled 2021-04-19: qty 4
  Filled 2021-04-19: qty 1

## 2021-04-19 MED ORDER — LEVOTHYROXINE SODIUM 88 MCG PO TABS
88.0000 ug | ORAL_TABLET | Freq: Every day | ORAL | Status: DC
  Administered 2021-04-20 – 2021-04-21 (×2): 88 ug via ORAL
  Filled 2021-04-19 (×2): qty 1

## 2021-04-19 MED ORDER — POTASSIUM CHLORIDE IN NACL 20-0.9 MEQ/L-% IV SOLN: INTRAVENOUS | Status: DC

## 2021-04-19 MED ORDER — POTASSIUM CHLORIDE CRYS ER 20 MEQ PO TBCR
40.0000 meq | EXTENDED_RELEASE_TABLET | Freq: Once | ORAL | Status: AC
  Administered 2021-04-19: 40 meq via ORAL
  Filled 2021-04-19: qty 2

## 2021-04-19 MED ORDER — POTASSIUM CHLORIDE 10 MEQ/100ML IV SOLN
10.0000 meq | INTRAVENOUS | Status: AC
  Administered 2021-04-19 (×2): 10 meq via INTRAVENOUS
  Filled 2021-04-19: qty 100

## 2021-04-19 MED ORDER — LORATADINE 10 MG PO TABS
10.0000 mg | ORAL_TABLET | Freq: Every day | ORAL | Status: DC
  Administered 2021-04-20 – 2021-04-21 (×2): 10 mg via ORAL
  Filled 2021-04-19 (×2): qty 1

## 2021-04-19 MED ORDER — ROSUVASTATIN CALCIUM 10 MG PO TABS
5.0000 mg | ORAL_TABLET | Freq: Every day | ORAL | Status: DC
  Administered 2021-04-19 – 2021-04-20 (×2): 5 mg via ORAL
  Filled 2021-04-19 (×2): qty 1

## 2021-04-19 MED ORDER — FOLIC ACID 1 MG PO TABS
1.0000 mg | ORAL_TABLET | Freq: Every day | ORAL | Status: DC
  Administered 2021-04-19 – 2021-04-21 (×3): 1 mg via ORAL
  Filled 2021-04-19 (×3): qty 1

## 2021-04-19 MED ORDER — ONDANSETRON HCL 4 MG PO TABS: 4.0000 mg | ORAL_TABLET | Freq: Four times a day (QID) | ORAL | Status: DC | PRN

## 2021-04-19 MED ORDER — FERROUS SULFATE 325 (65 FE) MG PO TABS
325.0000 mg | ORAL_TABLET | Freq: Every day | ORAL | Status: DC
  Administered 2021-04-20 – 2021-04-21 (×2): 325 mg via ORAL
  Filled 2021-04-19 (×2): qty 1

## 2021-04-19 MED ORDER — INDAPAMIDE 1.25 MG PO TABS
1.2500 mg | ORAL_TABLET | Freq: Every morning | ORAL | Status: DC
  Administered 2021-04-20: 1.25 mg via ORAL
  Filled 2021-04-19 (×3): qty 1

## 2021-04-19 MED ORDER — TRAZODONE HCL 50 MG PO TABS: 50.0000 mg | ORAL_TABLET | Freq: Every evening | ORAL | Status: DC | PRN

## 2021-04-19 MED ORDER — ALBUTEROL SULFATE (2.5 MG/3ML) 0.083% IN NEBU: 3.0000 mL | INHALATION_SOLUTION | RESPIRATORY_TRACT | Status: DC | PRN

## 2021-04-19 MED ORDER — SODIUM CHLORIDE 0.9 % IV SOLN: 250.0000 mL | INTRAVENOUS | Status: DC | PRN

## 2021-04-19 MED ORDER — HEPARIN SODIUM (PORCINE) 5000 UNIT/ML IJ SOLN
5000.0000 [IU] | Freq: Three times a day (TID) | INTRAMUSCULAR | Status: DC
  Administered 2021-04-19 – 2021-04-21 (×6): 5000 [IU] via SUBCUTANEOUS
  Filled 2021-04-19 (×6): qty 1

## 2021-04-19 MED ORDER — PANTOPRAZOLE SODIUM 40 MG PO TBEC
40.0000 mg | DELAYED_RELEASE_TABLET | Freq: Every day | ORAL | Status: DC
  Administered 2021-04-20 – 2021-04-21 (×2): 40 mg via ORAL
  Filled 2021-04-19 (×2): qty 1

## 2021-04-19 MED ORDER — ACETAMINOPHEN 325 MG PO TABS
650.0000 mg | ORAL_TABLET | Freq: Four times a day (QID) | ORAL | Status: DC | PRN
  Administered 2021-04-20 (×2): 650 mg via ORAL
  Filled 2021-04-19 (×2): qty 2

## 2021-04-19 MED ORDER — POTASSIUM CHLORIDE 10 MEQ/100ML IV SOLN
10.0000 meq | INTRAVENOUS | Status: AC
  Administered 2021-04-19 (×4): 10 meq via INTRAVENOUS
  Filled 2021-04-19 (×4): qty 100

## 2021-04-19 MED ORDER — SODIUM CHLORIDE 0.9% FLUSH: 3.0000 mL | INTRAVENOUS | Status: DC | PRN

## 2021-04-19 MED ORDER — POLYETHYLENE GLYCOL 3350 17 G PO PACK: 17.0000 g | PACK | Freq: Every day | ORAL | Status: DC | PRN

## 2021-04-19 MED ORDER — ONDANSETRON HCL 4 MG/2ML IJ SOLN: 4.0000 mg | Freq: Four times a day (QID) | INTRAMUSCULAR | Status: DC | PRN

## 2021-04-19 MED ORDER — POTASSIUM CHLORIDE CRYS ER 20 MEQ PO TBCR
40.0000 meq | EXTENDED_RELEASE_TABLET | ORAL | Status: AC
  Administered 2021-04-19 (×2): 40 meq via ORAL
  Filled 2021-04-19: qty 2
  Filled 2021-04-19: qty 4

## 2021-04-19 MED ORDER — MECLIZINE HCL 12.5 MG PO TABS: 25.0000 mg | ORAL_TABLET | Freq: Three times a day (TID) | ORAL | Status: DC | PRN

## 2021-04-19 MED ORDER — VITAMIN B-12 1000 MCG PO TABS
1000.0000 ug | ORAL_TABLET | Freq: Every day | ORAL | Status: DC
  Administered 2021-04-20 – 2021-04-21 (×2): 1000 ug via ORAL
  Filled 2021-04-19 (×2): qty 1

## 2021-04-19 MED ORDER — AMLODIPINE BESYLATE 5 MG PO TABS
5.0000 mg | ORAL_TABLET | Freq: Every day | ORAL | Status: DC
  Administered 2021-04-20 – 2021-04-21 (×2): 5 mg via ORAL
  Filled 2021-04-19 (×2): qty 1

## 2021-04-19 NOTE — Progress Notes (Signed)
Patient arrived to floor via stretcher

## 2021-04-19 NOTE — ED Notes (Signed)
Put pt on a 12 lead monitor 

## 2021-04-19 NOTE — Progress Notes (Signed)
Called for report and was told that attending nurse was unavailable. Charge nurse read ED note but was unable to relay information about patient, asked for new set of vital signs due to MEWS being a 3.

## 2021-04-19 NOTE — ED Triage Notes (Signed)
Patient to ED via EMS from home with complaints of dizziness since Saturday. Had PCP appointment for same this am but felt like she would pass out while walking to car. Vomited x1 when standing. Patient reports that when she is still, not moving the dizziness resolves.

## 2021-04-19 NOTE — H&P (Signed)
Patient Demographics:    Joanna [name], is a 70 y.o. 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  -Check carotid artery Dopplers to rule out hemodynamically significant stenosis --Suspect patient dizziness and near syncope is most  likely related to arrhythmias in the setting of significant electrolyte derangement -TSH was previously high, repeat TSH today has normalized -CT head without acute findings -May use meclizine as needed  2) severe hypokalemia and hypomagnesemia----patient with longstanding history of electrolyte derangement, she takes potassium and magnesium supplements at home -Magnesium today 0.4, potassium is 2.4, after potassium replacement repeat serum potassium is 2.5 -Continue to replace magnesium IV and potassium IV and orally -Watch for arrhythmias on telemetry unit -Check serum cortisol  3) spastic hemiplegia of the left upper extremity--- with  spasms---??  Etiology -Symptoms apparently have worsened over the last year, appears idiopathic at this time patient may benefit from specialized neurology evaluation at [facility] or [facility] or [facility]  4)DM2-A1c is 5.8 reflecting excellent diabetic control PTA Use Novolog/Humalog Sliding scale insulin with Accu-Cheks/Fingersticks as ordered   5)HTN--- hold losartan due to renal concerns, continue amlodipine 5 mg daily -Use IV labetalol as needed elevated BP  6)CKD stage - 3B in the setting of longstanding history of diabetes and hypertension -Creatinine is up to 1.79, should improve with hydration -Hold losartan for now -renally adjust medications, avoid nephrotoxic agents / dehydration  / hypotension  7) hypothyroidism--TSH is down to 0.389 from 10.1 about 3 months ago, continue levothyroxine  8) chronic anemia--- normocytic and normochromic, hemoglobin 10.6 which is close to patient's baseline -Suspect chronic anemia related to CKD -No acute bleeding concerns -Anemia work-up from January 2022 noted -B12 level was 1383 -Serum iron was 70 with iron saturation of 27 %, TIBC was normal at 261 and ferritin was 583 -Continue iron replacement and folic acid  9)Generalized weakness and ambulatory dysfunction--- patient has limitation due to left upper extremity spastic hemiplegia and chronic dizziness in the setting of electrolyte derangement--- await PT eval, prior to admission patient lived alone and had falls -Recurrent falls---PTA pt lived alone and did very  poorly, patient has significant limitations with mobility related ADLs- this patient needs to continue to be monitored in the hospital until a SNF bed is obtained as she is not safe to go home with her current physcical limitations  10) chronic headaches--- continue Topamax  11)HLD and atherosclerosis--- continue Crestor  Disposition/Need for in-Hospital Stay- patient unable to be discharged at this time due to --severe  electrolyte derangement with near syncope and concerns for arrhythmias requiring IV replacement of magnesium and potassium and observation on telemetry monitored unit*  Dispo: The patient is from: Home              Anticipated d/c is to:  Home versus SNF              Anticipated d/c date is: 1 day              Patient currently is not medically stable to d/c. 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 [facility]   Bilateral headaches    Depression    Diabetes mellitus    Diverticulosis    Dyslipidemia    GERD (gastroesophageal reflux disease)    Hypertension    Insomnia    Microproteinuria    Renal insufficiency 2010   Right carpal tunnel syndrome 12/24/2017   Sleep apnea    Sleep apnea 12/25/2017   Abnormal sleep study February 2019 CPAP ordered   Thyroid disease    hypothyroid      Past Surgical History:  Procedure Laterality Date   ABDOMINAL HYSTERECTOMY  1984   BACK SURGERY     BILATERAL OOPHORECTOMY     BIOPSY  02/19/2020   Procedure: BIOPSY;  Surgeon: [name], MD;  Location: [facility];  Service: Endoscopy;;  gastric   CERVICAL DISC SURGERY  01/16/2019   COLONOSCOPY  01/29/12   Fields-2-3 hyperplastic polyps, mod internal hemorrhoids, diverticulosis throughout colon   COLONOSCOPY N/A 07/03/2014   Dr. Oneida Alar: Hyperplastic polyps   COLONOSCOPY N/A 06/06/2019   Dr. Oneida Alar: Diverticulosis, hemorrhoids, single polyp removed, pathology revealed lipoma.  Next colonoscopy 10 years   ESOPHAGOGASTRODUODENOSCOPY   01/29/2012   Fields-2-3cm sliding HH, fundic gland polyps, NEGATIVE H pylori   ESOPHAGOGASTRODUODENOSCOPY N/A 07/03/2014   Dr. Oneida Alar: gastritis and normal small bowel biopsies   ESOPHAGOGASTRODUODENOSCOPY N/A 02/19/2020   Fields: hypertrophic folds in the stomach. benign polyp removed   [facility] CAPSULE STUDY N/A 10/26/2014   SMALL  BOWEL AVMs   KNEE ARTHROSCOPY Right    POLYPECTOMY  06/06/2019   Procedure: POLYPECTOMY;  Surgeon: [name], MD;  Location: [facility];  Service: Endoscopy;;   TOTAL ABDOMINAL HYSTERECTOMY W/ BILATERAL SALPINGOOPHORECTOMY        Chief Complaint  Patient presents with   Dizziness      HPI:    Ecko Kirkland  is a 70 y.o. female with past medical history relevant for HTN, tobacco abuse, DM2, CKD 3B, idiopathic left upper extremity spastic hemiplegia, chronic hypokalemia hypomagnesemia despite replacements, hypothyroidism who presents to the ED with worsening dizziness and near syncopal episodes -Dizziness has been worse since 04/17/2021, patient had emesis x1 without blood or bile -No frank chest pains no palpitations -No leg pains no leg swelling no pleuritic type symptoms -Patient also had headache -Additional history obtained from patient's friend [name] at bedside, also obtain additional history from patient's brother by phone  No fever  Or chills   No Nausea, Vomiting or Diarrhea  --Patient states that dizziness is mostly positional -In the ED EKG sinus rhythm without acute changes, -Troponin and echo are pending -Labs in the ED reveals magnesium of 0.4 and a potassium of 2.4 -On telemetry monitor occasional PVCs noted -Patient's creatinine is up to 1.79 hemoglobin is 10.6 -EDP requested admission due to severe electrolyte derangement with significant dizziness and fall risk with Near  syncope -CT head without acute findings    Review of systems:    In addition to the HPI above,   A full Review of  Systems was done, all other systems reviewed are negative except as noted above in HPI , .    Social History:  Reviewed by me    Social History   Tobacco Use   Smoking status: Every Day    Packs/day: 0.25    Years: 30.00    Pack years: 7.50    Types: Cigarettes    Last attempt to quit: 10/16/2010    Years since quitting: 10.5   Smokeless tobacco: Former    Quit  date: 10/28/2010  Substance Use Topics   Alcohol use: Yes    Comment: socially, maybe 3 times a year        Family History :  Reviewed by me    Family History  Problem Relation Age of Onset   Cardiomyopathy Father    Heart failure Mother    Hyperlipidemia Mother    Stroke Brother    Kidney cancer Brother    Diabetes Paternal Uncle    Heart disease Paternal Grandmother    Colon cancer Neg Hx    Colon polyps Neg Hx      Home Medications:   Prior to Admission medications   Medication Sig Start Date End Date Taking? Authorizing Provider  acetaminophen (TYLENOL) 500 MG tablet Take 1,000 mg by mouth every 8 (eight) hours as needed for moderate pain.    Yes [provider]  albuterol (PROVENTIL HFA;VENTOLIN HFA) 108 (90 Base) MCG/ACT inhaler Inhale 2 puffs into the lungs every 4 (four) hours as needed for wheezing or shortness of breath. 12/25/18  Yes [name], MD  allopurinol (ZYLOPRIM) 100 MG tablet Take 1 tablet (100 mg total) by mouth daily. Patient taking differently: Take 100 mg by mouth daily as needed (flare). 01/19/21  Yes [name], MD  amLODipine (NORVASC) 5 MG tablet Take 1 tablet (5 mg total) by mouth daily. 04/11/21  Yes [name], [name], MD  colchicine 0.6 MG tablet TAKE 1 TABLET BY MOUTH TWICE DAILY AS NEEDED FOR GOUT Patient taking differently: Take 0.6 mg by mouth 2 (two) times daily as needed (gout). 04/14/20  Yes [name], [name], MD  Cyanocobalamin (VITAMIN B-12 PO) Take 1 tablet by mouth daily.   Yes [provider]  Dulaglutide (TRULICITY) 1.96 QI/2.9NL SOPN Inject 0.75 mg into the skin once a week. 03/28/21 07/18/21 Yes [name], [name], MD  ferrous sulfate 325 (65 FE) MG tablet Take 325 mg by mouth daily with breakfast.   Yes [provider]  fexofenadine (ALLEGRA) 180 MG tablet Take 180 mg by mouth daily.   Yes [provider]  fluticasone (FLONASE) 50 MCG/ACT nasal spray PLACE 2 SPRAYS INTO THE NOSE AS NEEDED. Patient  taking differently: Place 2 sprays into both nostrils daily as needed for allergies. 01/06/21  Yes Chalmers Guest, NP  folic acid (FOLVITE) 1 MG tablet TAKE 1 TABLET(1 MG)  BY MOUTH DAILY Patient taking differently: Take 1 mg by mouth daily. 01/31/21  Yes [name], MD  indapamide (LOZOL) 1.25 MG tablet Take 1 tablet (1.25 mg total) by mouth every morning. 01/19/21  Yes [name], MD  levothyroxine (SYNTHROID) 88 MCG tablet Take 1 tablet (88 mcg total) by mouth daily before breakfast. 01/27/21  Yes [name], [name], MD  losartan (COZAAR) 100 MG tablet Take 1 tablet by mouth daily .(Needs office visit) 01/19/21  Yes [name], MD  Magnesium 250 MG TABS Take 250 mg by mouth daily.    Yes [provider]  omeprazole (PRILOSEC) 40 MG capsule Take 1 capsule (40 mg total) by mouth daily. 01/19/21  Yes [name], [name], MD  potassium chloride (KLOR-CON) 10 MEQ tablet Take 3 tablets po each morning and 3 tablet po each evening Patient taking differently: Take 30 mEq by mouth 2 (two) times daily. Take 3 tablets po each morning and 3 tablet po each evening 01/21/21  Yes [name], [name], MD  rosuvastatin (CRESTOR) 20 MG tablet Take 1 tablet by mouth daily (Stop Pravastatin) 01/19/21  Yes [name], [name], MD  Simethicone (GAS-X PO) Take 1 capsule by mouth as needed (gas/bloating.).    Yes [provider]  tiZANidine (ZANAFLEX) 2 MG tablet TAKE 1 TABLET BY MOUTH THREE TIMES DAILY **MUST MAKE APPT FOR CONTINUED REFILLS** 12/27/20 Patient taking differently: Take 2 mg by mouth 3 (three) times daily. 12/27/20  Yes [name], MD  topiramate (TOPAMAX) 100 MG tablet Take 1 tablet (100 mg total) by mouth 2 (two) times daily. 01/19/21  Yes [name], [name], MD  traZODone (DESYREL) 50 MG tablet TAKE 1/2 TO 1 TABLET BY MOUTH EVERY NIGHT FOR SLEEP Patient taking differently: Take 25-50 mg by mouth at bedtime. 01/19/21  Yes [name], MD     Allergies:     Allergies  Allergen Reactions    Ivp Dye [Iodinated Diagnostic Agents]    Zoloft [Sertraline Hcl]     Withdrawn,bad dreams     Physical Exam:   Vitals  Blood pressure (!) 142/80, pulse 92, temperature 98.3 F (36.8 C), temperature source Oral, resp. rate 20, height 5\' 2"  (1.575 m), weight 59 kg, SpO2 100 %.  Physical Examination: General appearance - alert, and in no distress  Mental status - alert, oriented to person, place, and time,  Eyes - sclera anicteric Neck - supple, no JVD elevation , Chest - clear  to auscultation bilaterally, symmetrical air movement,  Heart - S1 and S2 normal, regular  Abdomen - soft, nontender, nondistended, no masses or organomegaly Neurological - screening mental status exam normal, neck supple without rigidity, cranial nerves II through XII intact,  Extremities - no pedal edema noted, intact peripheral pulses  Skin - warm, dry MSK-left upper extremity spastic hemiplegia, hypertonicity     Data Review:    CBC Recent Labs  Lab 04/19/21 1133  WBC 8.3  HGB 10.6*  HCT 32.8*  PLT 181  MCV 90.4  MCH 29.2  MCHC 32.3  RDW 13.3  LYMPHSABS 1.3  MONOABS 0.6  EOSABS 0.0  BASOSABS 0.0   ------------------------------------------------------------------------------------------------------------------  Chemistries  Recent Labs  Lab 04/19/21 1133  NA 141  140  K 2.5*  2.4*  CL 106  105  CO2 23  23  GLUCOSE 93  94  BUN 23  23  CREATININE 1.79*  1.70*  CALCIUM 8.3*  8.2*  MG 0.4*   ------------------------------------------------------------------------------------------------------------------ estimated creatinine clearance  is 24.7 mL/min (A) (by C-G formula based on SCr of 1.7 mg/dL (H)). ------------------------------------------------------------------------------------------------------------------ Recent Labs    04/19/21 1134  TSH 0.389     Coagulation profile No results for input(s): INR, PROTIME in the last 168  hours. ------------------------------------------------------------------------------------------------------------------- No results for input(s): DDIMER in the last 72 hours. -------------------------------------------------------------------------------------------------------------------  Cardiac Enzymes No results for input(s): CKMB, TROPONINI, MYOGLOBIN in the last 168 hours.  Invalid input(s): CK ------------------------------------------------------------------------------------------------------------------ No results found for: BNP   ---------------------------------------------------------------------------------------------------------------  Urinalysis    Component Value Date/Time   PROTEINUR trace 01/03/2018 1638   LEUKOCYTESUR Negative 01/03/2018 1638    ----------------------------------------------------------------------------------------------------------------   Imaging Results:    CT Head Wo Contrast  Result Date: 04/19/2021 CLINICAL DATA:  Dizziness, headache. EXAM: CT HEAD WITHOUT CONTRAST TECHNIQUE: Contiguous axial images were obtained from the base of the skull through the vertex without intravenous contrast. COMPARISON:  None. FINDINGS: Brain: Mild chronic ischemic white matter disease is noted. No mass effect or midline shift is noted. Ventricular size is within normal limits. There is no evidence of mass lesion, hemorrhage or acute infarction. Vascular: No hyperdense vessel or unexpected calcification. Skull: Normal. Negative for fracture or focal lesion. Sinuses/Orbits: No acute finding. Other: None. IMPRESSION: No acute intracranial abnormality seen. Electronically Signed   By: Marijo Conception M.D.   On: 04/19/2021 12:37    Radiological Exams on Admission: CT Head Wo Contrast  Result Date: 04/19/2021 CLINICAL DATA:  Dizziness, headache. EXAM: CT HEAD WITHOUT CONTRAST TECHNIQUE: Contiguous axial images were obtained from the base of the skull through the  vertex without intravenous contrast. COMPARISON:  None. FINDINGS: Brain: Mild chronic ischemic white matter disease is noted. No mass effect or midline shift is noted. Ventricular size is within normal limits. There is no evidence of mass lesion, hemorrhage or acute infarction. Vascular: No hyperdense vessel or unexpected calcification. Skull: Normal. Negative for fracture or focal lesion. Sinuses/Orbits: No acute finding. Other: None. IMPRESSION: No acute intracranial abnormality seen. Electronically Signed   By: Marijo Conception M.D.   On: 04/19/2021 12:37    DVT Prophylaxis -SCD   AM Labs Ordered, also please review Full Orders  Family Communication: Admission, patients condition and plan of care including tests being ordered have been discussed with the patient and brother who indicate understanding and agree with the plan   Code Status - Full Code  Likely DC to  home Vs SNF after PT eval  Condition -stable  Roxan Hockey M.D on 04/19/2021 at 5:39 PM Go to [email or web address] -  for contact info  [facility] - Office  [phone]

## 2021-04-19 NOTE — Progress Notes (Signed)
  Echocardiogram 2D Echocardiogram has been performed.  Joanna [name] 04/19/2021, 3:55 PM

## 2021-04-19 NOTE — ED Notes (Signed)
Patient to CT at this time

## 2021-04-19 NOTE — ED Notes (Signed)
Called twice to give report, receiving nurse unavailable and to call ED ASAP.

## 2021-04-19 NOTE — ED Provider Notes (Signed)
[facility] EMERGENCY DEPARTMENT Provider Note   CSN: [id number] Arrival date & time: 04/19/21  1006     History Chief Complaint  Patient presents with   Dizziness    Joanna [name] is a 70 y.o. female.  HPI     70 year old comes in with chief complaint of dizziness.  Patient has history of diabetes, hypertension.  Patient reports that she started having dizziness on Saturday.  Dizziness is present every time she ambulates.  Her dizziness is described as ataxic gait.  She does not have any associated nausea, vomiting, does indicate that today she felt lightheaded enough where she felt like she might pass out.  She denies any associated chest pain, palpitations, shortness of breath.  Today she also had an emesis x1.  Patient symptoms resolved and she is still.  She denies any new BP meds.  Her diabetes medication was changed recently.  No history of similar symptoms.  Review of system is negative for any other focal neurologic deficits such as vision change, focal weakness, numbness, slurred speech.  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 G[facility]   Bilateral headaches    Depression    Diabetes mellitus    Diverticulosis    Dyslipidemia    GERD (gastroesophageal reflux disease)    Hypertension    Insomnia    Microproteinuria    Renal insufficiency 2010   Right carpal tunnel syndrome 12/24/2017   Sleep apnea    Sleep apnea 12/25/2017   Abnormal sleep study February 2019 CPAP ordered   Thyroid disease    hypothyroid    Patient Active Problem List   Diagnosis Date Noted   Dizziness and giddiness 04/19/2021   Hypomagnesemia 04/19/2021   Hypokalemia 04/19/2021   Near syncope 04/19/2021   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 ([place]) 01/19/2021   Non-recurrent acute suppurative otitis media of left ear without spontaneous rupture of tympanic membrane 01/06/2021    Seasonal allergic rhinitis due to pollen 01/06/2021   Abnormal CT scan, stomach 02/06/2020   Coronary artery disease due to lipid rich plaque 11/19/2019   Aortic atherosclerosis ([place]) 11/19/2019   Constipation 10/06/2019   AVM (arteriovenous malformation) of small bowel, acquired    IDA (iron deficiency anemia) 12/03/2018   Sleep apnea 12/25/2017   Right carpal tunnel syndrome 12/24/2017   Chronic anxiety 02/13/2017   Right rotator cuff tendinitis 02/11/2015   Iron deficiency 08/25/2014   Microcytic anemia 06/17/2014   Insomnia 05/04/2014   Hypothyroidism 06/05/2013   Renal insufficiency 06/05/2013   Essential hypertension, benign 06/05/2013   Stress fracture of right foot 02/27/2013   Chronic radicular lumbar pain 02/27/2013   Hyperlipidemia 01/16/2013   Type 2 diabetes mellitus with stage 4 chronic kidney disease, without long-term current use of insulin ([place]) 01/16/2013    Past Surgical History:  Procedure Laterality Date   ABDOMINAL HYSTERECTOMY  1984   BACK SURGERY     BILATERAL OOPHORECTOMY     BIOPSY  02/19/2020   Procedure: BIOPSY;  Surgeon: [name], MD;  Location: [facility];  Service: Endoscopy;;  gastric   CERVICAL DISC SURGERY  01/16/2019   COLONOSCOPY  01/29/12   Fields-2-3 hyperplastic polyps, mod internal hemorrhoids, diverticulosis throughout colon   COLONOSCOPY N/A 07/03/2014   Dr. Oneida Alar: Hyperplastic polyps   COLONOSCOPY N/A 06/06/2019   Dr. Oneida Alar: Diverticulosis, hemorrhoids, single polyp removed, pathology revealed lipoma.  Next colonoscopy 10 years  ESOPHAGOGASTRODUODENOSCOPY   01/29/2012   Fields-2-3cm sliding HH, fundic gland polyps, NEGATIVE H pylori   ESOPHAGOGASTRODUODENOSCOPY N/A 07/03/2014   Dr. Oneida Alar: gastritis and normal small bowel biopsies   ESOPHAGOGASTRODUODENOSCOPY N/A 02/19/2020   Fields: hypertrophic folds in the stomach. benign polyp removed   GIVENS CAPSULE STUDY N/A 10/26/2014   SMALL BOWEL AVMs   KNEE ARTHROSCOPY Right     POLYPECTOMY  06/06/2019   Procedure: POLYPECTOMY;  Surgeon: [name], MD;  Location: [facility];  Service: Endoscopy;;   TOTAL ABDOMINAL HYSTERECTOMY W/ BILATERAL SALPINGOOPHORECTOMY       OB History   No obstetric history on file.     Family History  Problem Relation Age of Onset   Cardiomyopathy Father    Heart failure Mother    Hyperlipidemia Mother    Stroke Brother    Kidney cancer Brother    Diabetes Paternal Uncle    Heart disease Paternal Grandmother    Colon cancer Neg Hx    Colon polyps Neg Hx     Social History   Tobacco Use   Smoking status: Every Day    Packs/day: 0.25    Years: 30.00    Pack years: 7.50    Types: Cigarettes    Last attempt to quit: 10/16/2010    Years since quitting: 10.5   Smokeless tobacco: Former    Quit date: 10/28/2010  Vaping Use   Vaping Use: Never used  Substance Use Topics   Alcohol use: Yes    Comment: socially, maybe 3 times a year    Drug use: No    Home Medications Prior to Admission medications   Medication Sig Start Date End Date Taking? Authorizing Provider  acetaminophen (TYLENOL) 500 MG tablet Take 1,000 mg by mouth every 8 (eight) hours as needed for moderate pain.    Yes [provider]  albuterol (PROVENTIL HFA;VENTOLIN HFA) 108 (90 Base) MCG/ACT inhaler Inhale 2 puffs into the lungs every 4 (four) hours as needed for wheezing or shortness of breath. 12/25/18  Yes [name], MD  allopurinol (ZYLOPRIM) 100 MG tablet Take 1 tablet (100 mg total) by mouth daily. Patient taking differently: Take 100 mg by mouth daily as needed (flare). 01/19/21  Yes [name], MD  amLODipine (NORVASC) 5 MG tablet Take 1 tablet (5 mg total) by mouth daily. 04/11/21  Yes [name], [name], MD  colchicine 0.6 MG tablet TAKE 1 TABLET BY MOUTH TWICE DAILY AS NEEDED FOR GOUT Patient taking differently: Take 0.6 mg by mouth 2 (two) times daily as needed (gout). 04/14/20  Yes [name], [name], MD  Cyanocobalamin (VITAMIN  B-12 PO) Take 1 tablet by mouth daily.   Yes [provider]  Dulaglutide (TRULICITY) 9.48 NI/6.2VO SOPN Inject 0.75 mg into the skin once a week. 03/28/21 07/18/21 Yes [name], [name], MD  ferrous sulfate 325 (65 FE) MG tablet Take 325 mg by mouth daily with breakfast.   Yes [provider]  fexofenadine (ALLEGRA) 180 MG tablet Take 180 mg by mouth daily.   Yes [provider]  fluticasone (FLONASE) 50 MCG/ACT nasal spray PLACE 2 SPRAYS INTO THE NOSE AS NEEDED. Patient taking differently: Place 2 sprays into both nostrils daily as needed for allergies. 01/06/21  Yes Chalmers Guest, NP  folic acid (FOLVITE) 1 MG tablet TAKE 1 TABLET(1 MG) BY MOUTH DAILY Patient taking differently: Take 1 mg by mouth daily. 01/31/21  Yes [name], MD  indapamide (LOZOL) 1.25 MG tablet  Take 1 tablet (1.25 mg total) by mouth every morning. 01/19/21  Yes [name], MD  levothyroxine (SYNTHROID) 88 MCG tablet Take 1 tablet (88 mcg total) by mouth daily before breakfast. 01/27/21  Yes [name], [name], MD  losartan (COZAAR) 100 MG tablet Take 1 tablet by mouth daily .(Needs office visit) 01/19/21  Yes [name], MD  Magnesium 250 MG TABS Take 250 mg by mouth daily.    Yes [provider]  omeprazole (PRILOSEC) 40 MG capsule Take 1 capsule (40 mg total) by mouth daily. 01/19/21  Yes [name], [name], MD  potassium chloride (KLOR-CON) 10 MEQ tablet Take 3 tablets po each morning and 3 tablet po each evening Patient taking differently: Take 30 mEq by mouth 2 (two) times daily. Take 3 tablets po each morning and 3 tablet po each evening 01/21/21  Yes [name], [name], MD  rosuvastatin (CRESTOR) 20 MG tablet Take 1 tablet by mouth daily (Stop Pravastatin) 01/19/21  Yes [name], [name], MD  Simethicone (GAS-X PO) Take 1 capsule by mouth as needed (gas/bloating.).    Yes [provider]  tiZANidine (ZANAFLEX) 2 MG tablet TAKE 1 TABLET BY MOUTH THREE TIMES DAILY **MUST MAKE APPT  FOR CONTINUED REFILLS** 12/27/20 Patient taking differently: Take 2 mg by mouth 3 (three) times daily. 12/27/20  Yes [name], MD  topiramate (TOPAMAX) 100 MG tablet Take 1 tablet (100 mg total) by mouth 2 (two) times daily. 01/19/21  Yes [name], [name], MD  traZODone (DESYREL) 50 MG tablet TAKE 1/2 TO 1 TABLET BY MOUTH EVERY NIGHT FOR SLEEP Patient taking differently: Take 25-50 mg by mouth at bedtime. 01/19/21  Yes [name], MD    Allergies    Ivp dye [iodinated diagnostic agents] and Zoloft [sertraline hcl]  Review of Systems   Review of Systems  Constitutional:  Positive for activity change.  Respiratory:  Negative for shortness of breath.   Cardiovascular:  Negative for chest pain.  Gastrointestinal:  Positive for nausea and vomiting.  Neurological:  Positive for dizziness.  All other systems reviewed and are negative.  Physical Exam Updated Vital Signs BP 119/69 (BP Location: Right Arm)   Pulse 72   Temp 98.3 F (36.8 C) (Oral)   Resp 19   Ht 5\' 2"  (1.575 m)   Wt 59 kg   SpO2 99%   BMI 23.78 kg/m   Physical Exam Vitals and nursing note reviewed.  Constitutional:      Appearance: She is well-developed.  HENT:     Head: Atraumatic.  Eyes:     Extraocular Movements: Extraocular movements intact.     Pupils: Pupils are equal, round, and reactive to light.     Comments: No nystagmus, hints test performed even though there is no vertigo, it is negative for any acute changes  Cardiovascular:     Rate and Rhythm: Normal rate.  Pulmonary:     Effort: Pulmonary effort is normal.  Musculoskeletal:     Cervical back: Normal range of motion and neck supple.  Skin:    General: Skin is warm and dry.  Neurological:     Mental Status: She is alert and oriented to person, place, and time.     Cranial Nerves: No cranial nerve deficit.     Sensory: No sensory deficit.     Motor: No weakness.     Coordination: Coordination normal.    ED Results / Procedures /  Treatments   Labs (all labs  ordered are listed, but only abnormal results are displayed) Labs Reviewed  CBC WITH DIFFERENTIAL/PLATELET - Abnormal; Notable for the following components:      Result Value   RBC 3.63 (*)    Hemoglobin 10.6 (*)    HCT 32.8 (*)    All other components within normal limits  BASIC METABOLIC PANEL - Abnormal; Notable for the following components:   Potassium 2.4 (*)    Creatinine, Ser 1.70 (*)    Calcium 8.2 (*)    GFR, Estimated 32 (*)    All other components within normal limits  MAGNESIUM - Abnormal; Notable for the following components:   Magnesium 0.4 (*)    All other components within normal limits  RENAL FUNCTION PANEL - Abnormal; Notable for the following components:   Potassium 2.5 (*)    Creatinine, Ser 1.79 (*)    Calcium 8.3 (*)    GFR, Estimated 30 (*)    All other components within normal limits  RENAL FUNCTION PANEL - Abnormal; Notable for the following components:   Potassium 5.4 (*)    Chloride 115 (*)    CO2 21 (*)    Creatinine, Ser 1.22 (*)    Phosphorus 1.5 (*)    GFR, Estimated 48 (*)    All other components within normal limits  CBC - Abnormal; Notable for the following components:   RBC 3.44 (*)    Hemoglobin 10.0 (*)    HCT 33.2 (*)    All other components within normal limits  MAGNESIUM - Abnormal; Notable for the following components:   Magnesium 1.4 (*)    All other components within normal limits  RESP PANEL BY RT-PCR (FLU A&B, COVID) ARPGX2  TSH  CORTISOL-AM, BLOOD  TROPONIN I (HIGH SENSITIVITY)  TROPONIN I (HIGH SENSITIVITY)    EKG EKG Interpretation  Date/Time:  Tuesday April 19 2021 11:37:00 EDT Ventricular Rate:  71 PR Interval:  174 QRS Duration: 98 QT Interval:  422 QTC Calculation: 459 R Axis:   -13 Text Interpretation: Sinus rhythm Borderline low voltage, extremity leads No acute changes No significant change since last tracing Confirmed by Varney Biles ([id number]) on 04/19/2021 11:45:54  AM  Radiology CT Head Wo Contrast  Result Date: 04/19/2021 CLINICAL DATA:  Dizziness, headache. EXAM: CT HEAD WITHOUT CONTRAST TECHNIQUE: Contiguous axial images were obtained from the base of the skull through the vertex without intravenous contrast. COMPARISON:  None. FINDINGS: Brain: Mild chronic ischemic white matter disease is noted. No mass effect or midline shift is noted. Ventricular size is within normal limits. There is no evidence of mass lesion, hemorrhage or acute infarction. Vascular: No hyperdense vessel or unexpected calcification. Skull: Normal. Negative for fracture or focal lesion. Sinuses/Orbits: No acute finding. Other: None. IMPRESSION: No acute intracranial abnormality seen. Electronically Signed   By: Marijo Conception M.D.   On: 04/19/2021 12:37   ECHOCARDIOGRAM COMPLETE  Result Date: 04/19/2021    ECHOCARDIOGRAM REPORT   Patient Name:   Joanna [name] Date of Exam: 04/19/2021 Medical Rec #:  [id number]    Height:       62.0 in Accession #:    [id number]   Weight:       130.0 lb Date of Birth:  01-03-1951    BSA:          1.592 m Patient Age:    65 years     BP:           145/95 mmHg Patient Gender: F  HR:           86 bpm. Exam Location:  [facility] Procedure: 2D Echo, Cardiac Doppler and Color Doppler Indications:    Abnormal ECG  History:        Patient has no prior history of Echocardiogram examinations.                 CAD, Arrythmias:LBBB; Risk Factors:Hypertension, Diabetes and                 Dyslipidemia.  Sonographer:    Wenda Low Referring Phys: [place]  1. Left ventricular ejection fraction, by estimation, is 60 to 65%. The left ventricle has normal function. The left ventricle has no regional wall motion abnormalities. Left ventricular diastolic parameters were normal.  2. Right ventricular systolic function is normal. The right ventricular size is normal. There is normal pulmonary artery systolic pressure.  3. The mitral valve is  normal in structure. Trivial mitral valve regurgitation. No evidence of mitral stenosis.  4. The aortic valve is tricuspid. There is mild calcification of the aortic valve. Aortic valve regurgitation is trivial. Mild aortic valve sclerosis is present, with no evidence of aortic valve stenosis.  5. Aortic dilatation noted. There is mild dilatation of the aortic root, measuring 39 mm.  6. The inferior vena cava is normal in size with greater than 50% respiratory variability, suggesting right atrial pressure of 3 mmHg. FINDINGS  Left Ventricle: Left ventricular ejection fraction, by estimation, is 60 to 65%. The left ventricle has normal function. The left ventricle has no regional wall motion abnormalities. The left ventricular internal cavity size was normal in size. There is  no left ventricular hypertrophy. Left ventricular diastolic parameters were normal. Right Ventricle: The right ventricular size is normal. No increase in right ventricular wall thickness. Right ventricular systolic function is normal. There is normal pulmonary artery systolic pressure. The tricuspid regurgitant velocity is 2.56 m/s, and  with an assumed right atrial pressure of 3 mmHg, the estimated right ventricular systolic pressure is 78.2 mmHg. Left Atrium: Left atrial size was normal in size. Right Atrium: Right atrial size was normal in size. Pericardium: There is no evidence of pericardial effusion. Mitral Valve: The mitral valve is normal in structure. Trivial mitral valve regurgitation. No evidence of mitral valve stenosis. MV peak gradient, 4.8 mmHg. The mean mitral valve gradient is 1.0 mmHg. Tricuspid Valve: The tricuspid valve is normal in structure. Tricuspid valve regurgitation is mild . No evidence of tricuspid stenosis. Aortic Valve: The aortic valve is tricuspid. There is mild calcification of the aortic valve. Aortic valve regurgitation is trivial. Mild aortic valve sclerosis is present, with no evidence of aortic valve  stenosis. Aortic valve mean gradient measures 3.0 mmHg. Aortic valve peak gradient measures 8.2 mmHg. Aortic valve area, by VTI measures 2.80 cm. Pulmonic Valve: The pulmonic valve was normal in structure. Pulmonic valve regurgitation is not visualized. No evidence of pulmonic stenosis. Aorta: The aortic root is normal in size and structure and aortic dilatation noted. There is mild dilatation of the aortic root, measuring 39 mm. Venous: The inferior vena cava is normal in size with greater than 50% respiratory variability, suggesting right atrial pressure of 3 mmHg. IAS/Shunts: No atrial level shunt detected by color flow Doppler.  LEFT VENTRICLE PLAX 2D LVIDd:         4.39 cm  Diastology LVIDs:         2.90 cm  LV e' medial:  11.50 cm/s LV PW:         1.14 cm  LV E/e' medial:  7.6 LV IVS:        0.98 cm  LV e' lateral:   10.10 cm/s LVOT diam:     2.00 cm  LV E/e' lateral: 8.6 LV SV:         81 LV SV Index:   51 LVOT Area:     3.14 cm  RIGHT VENTRICLE RV Basal diam:  2.66 cm RV Mid diam:    2.44 cm RV S prime:     11.60 cm/s TAPSE (M-mode): 2.3 cm LEFT ATRIUM             Index       RIGHT ATRIUM           Index LA diam:        3.30 cm 2.07 cm/m  RA Area:     12.00 cm LA Vol (A2C):   44.5 ml 27.95 ml/m RA Volume:   24.60 ml  15.45 ml/m LA Vol (A4C):   38.7 ml 24.31 ml/m LA Biplane Vol: 43.4 ml 27.26 ml/m  AORTIC VALVE AV Area (Vmax):    2.83 cm AV Area (Vmean):   2.84 cm AV Area (VTI):     2.80 cm AV Vmax:           143.00 cm/s AV Vmean:          83.400 cm/s AV VTI:            0.291 m AV Peak Grad:      8.2 mmHg AV Mean Grad:      3.0 mmHg LVOT Vmax:         129.00 cm/s LVOT Vmean:        75.300 cm/s LVOT VTI:          0.259 m LVOT/AV VTI ratio: 0.89  AORTA Ao Root diam: 3.50 cm Ao Asc diam:  3.90 cm MITRAL VALVE                TRICUSPID VALVE MV Area (PHT): 3.61 cm     TR Peak grad:   26.2 mmHg MV Area VTI:   2.00 cm     TR Vmax:        256.00 cm/s MV Peak grad:  4.8 mmHg MV Mean grad:  1.0 mmHg      SHUNTS MV Vmax:       1.10 m/s     Systemic VTI:  0.26 m MV Vmean:      51.0 cm/s    Systemic Diam: 2.00 cm MV Decel Time: 210 msec MV E velocity: 87.30 cm/s MV A velocity: 116.00 cm/s MV E/A ratio:  0.75 Jenkins Rouge MD Electronically signed by Jenkins Rouge MD Signature Date/Time: 04/19/2021/6:23:17 PM    Final     Procedures .Critical Care  Date/Time: 04/20/2021 8:21 AM Performed by: Varney Biles, MD Authorized by: Varney Biles, MD   Critical care provider statement:    Critical care time (minutes):  40   Critical care was necessary to treat or prevent imminent or life-threatening deterioration of the following conditions:  Metabolic crisis   Critical care was time spent personally by me on the following activities:  Discussions with consultants, evaluation of patient's response to treatment, examination of patient, ordering and performing treatments and interventions, ordering and review of laboratory studies, ordering and review of radiographic studies, pulse oximetry, re-evaluation of patient's condition, obtaining history from patient or  surrogate and review of old charts   Medications Ordered in ED Medications  meclizine (ANTIVERT) tablet 25 mg (has no administration in time range)  ferrous sulfate tablet 325 mg (has no administration in time range)  albuterol (PROVENTIL) (2.5 MG/3ML) 0.083% nebulizer solution 3 mL (has no administration in time range)  indapamide (LOZOL) tablet 1.25 mg ( Oral Canceled Entry 04/19/21 1319)  rosuvastatin (CRESTOR) tablet 5 mg (5 mg Oral Given 04/19/21 1745)  topiramate (TOPAMAX) tablet 100 mg (100 mg Oral Given 04/19/21 2113)  levothyroxine (SYNTHROID) tablet 88 mcg (88 mcg Oral Given 11/23/23 3664)  folic acid (FOLVITE) tablet 1 mg (1 mg Oral Given 04/19/21 1455)  amLODipine (NORVASC) tablet 5 mg (has no administration in time range)  pantoprazole (PROTONIX) EC tablet 40 mg (has no administration in time range)  loratadine (CLARITIN) tablet 10 mg (has no  administration in time range)  vitamin B-12 (CYANOCOBALAMIN) tablet 1,000 mcg (has no administration in time range)  sodium chloride flush (NS) 0.9 % injection 3 mL (3 mLs Intravenous Given 04/19/21 2200)  sodium chloride flush (NS) 0.9 % injection 3 mL (3 mLs Intravenous Given 04/19/21 2200)  sodium chloride flush (NS) 0.9 % injection 3 mL (has no administration in time range)  0.9 %  sodium chloride infusion (has no administration in time range)  acetaminophen (TYLENOL) tablet 650 mg (650 mg Oral Given 04/20/21 0123)    Or  acetaminophen (TYLENOL) suppository 650 mg ( Rectal See Alternative 04/20/21 0123)  traZODone (DESYREL) tablet 50 mg (has no administration in time range)  polyethylene glycol (MIRALAX / GLYCOLAX) packet 17 g (has no administration in time range)  bisacodyl (DULCOLAX) suppository 10 mg (has no administration in time range)  ondansetron (ZOFRAN) tablet 4 mg (has no administration in time range)    Or  ondansetron (ZOFRAN) injection 4 mg (has no administration in time range)  heparin injection 5,000 Units (5,000 Units Subcutaneous Given 04/20/21 0519)  magnesium sulfate IVPB 2 g 50 mL (0 g Intravenous Stopped 04/19/21 1341)  potassium chloride 10 mEq in 100 mL IVPB (0 mEq Intravenous Stopped 04/19/21 1447)  potassium chloride SA (KLOR-CON) CR tablet 40 mEq (40 mEq Oral Given 04/19/21 1240)  potassium chloride 10 mEq in 100 mL IVPB (10 mEq Intravenous New Bag/Given 04/19/21 1803)  potassium chloride SA (KLOR-CON) CR tablet 40 mEq (40 mEq Oral Given 04/19/21 1745)  magnesium sulfate IVPB 2 g 50 mL (0 g Intravenous Stopped 04/19/21 1603)    ED Course  I have reviewed the triage vital signs and the nursing notes.  Pertinent labs & imaging results that were available during my care of the patient were reviewed by me and considered in my medical decision making (see chart for details).    MDM Rules/Calculators/A&P                          70 year old comes in a chief complaint of dizziness.   She had an episode of nausea and vomiting today along with near fainting spell.  The dizziness is present only when she is walking and it resolves when she is still.  Patient's neuro exam is nonfocal.  No red flags suggesting intracranial bleed.  Initial differential diagnosis based on complaint included stroke, brain bleed, cerebral aneurysm, orthostatic hypotension, medication side effect, anemia.  Basic labs have been ordered along with orthostatics and we we will also ambulate the patient here.  Reassessment: Patient has profound hypokalemia and hypomagnesemia.  EKG reviewed.  Telemetry monitoring also reviewed.  She had some events of PVCs and wide-complex tachycardia which could be artifact.  On my multiple reassessments she has not had any tachycardia dysrhythmias. We will admit her to the hospital given her near syncope and the metabolic disturbance.  IV magnesium and potassium ordered   Final Clinical Impression(s) / ED Diagnoses Final diagnoses:  Acute hypokalemia  Hypomagnesemia  Near syncope    Rx / DC Orders ED Discharge Orders     None        Varney Biles, MD 04/20/21 [phone]

## 2021-04-20 ENCOUNTER — Observation Stay (HOSPITAL_COMMUNITY): Payer: PPO

## 2021-04-20 DIAGNOSIS — N184 Chronic kidney disease, stage 4 (severe): Secondary | ICD-10-CM | POA: Diagnosis present

## 2021-04-20 DIAGNOSIS — G8114 Spastic hemiplegia affecting left nondominant side: Secondary | ICD-10-CM | POA: Diagnosis present

## 2021-04-20 DIAGNOSIS — Z823 Family history of stroke: Secondary | ICD-10-CM | POA: Diagnosis not present

## 2021-04-20 DIAGNOSIS — E876 Hypokalemia: Secondary | ICD-10-CM

## 2021-04-20 DIAGNOSIS — K219 Gastro-esophageal reflux disease without esophagitis: Secondary | ICD-10-CM | POA: Diagnosis present

## 2021-04-20 DIAGNOSIS — E039 Hypothyroidism, unspecified: Secondary | ICD-10-CM | POA: Diagnosis present

## 2021-04-20 DIAGNOSIS — E1122 Type 2 diabetes mellitus with diabetic chronic kidney disease: Secondary | ICD-10-CM | POA: Diagnosis present

## 2021-04-20 DIAGNOSIS — Z8249 Family history of ischemic heart disease and other diseases of the circulatory system: Secondary | ICD-10-CM | POA: Diagnosis not present

## 2021-04-20 DIAGNOSIS — F17210 Nicotine dependence, cigarettes, uncomplicated: Secondary | ICD-10-CM | POA: Diagnosis present

## 2021-04-20 DIAGNOSIS — R42 Dizziness and giddiness: Secondary | ICD-10-CM | POA: Diagnosis not present

## 2021-04-20 DIAGNOSIS — Z888 Allergy status to other drugs, medicaments and biological substances status: Secondary | ICD-10-CM | POA: Diagnosis not present

## 2021-04-20 DIAGNOSIS — K5790 Diverticulosis of intestine, part unspecified, without perforation or abscess without bleeding: Secondary | ICD-10-CM | POA: Diagnosis present

## 2021-04-20 DIAGNOSIS — E785 Hyperlipidemia, unspecified: Secondary | ICD-10-CM | POA: Diagnosis present

## 2021-04-20 DIAGNOSIS — I10 Essential (primary) hypertension: Secondary | ICD-10-CM

## 2021-04-20 DIAGNOSIS — Z20822 Contact with and (suspected) exposure to covid-19: Secondary | ICD-10-CM | POA: Diagnosis present

## 2021-04-20 DIAGNOSIS — Z79899 Other long term (current) drug therapy: Secondary | ICD-10-CM | POA: Diagnosis not present

## 2021-04-20 DIAGNOSIS — R260 Ataxic gait: Secondary | ICD-10-CM | POA: Diagnosis present

## 2021-04-20 DIAGNOSIS — G4730 Sleep apnea, unspecified: Secondary | ICD-10-CM | POA: Diagnosis present

## 2021-04-20 DIAGNOSIS — Z833 Family history of diabetes mellitus: Secondary | ICD-10-CM | POA: Diagnosis not present

## 2021-04-20 DIAGNOSIS — I6523 Occlusion and stenosis of bilateral carotid arteries: Secondary | ICD-10-CM | POA: Diagnosis not present

## 2021-04-20 DIAGNOSIS — Z91041 Radiographic dye allergy status: Secondary | ICD-10-CM | POA: Diagnosis not present

## 2021-04-20 DIAGNOSIS — Z79890 Hormone replacement therapy (postmenopausal): Secondary | ICD-10-CM | POA: Diagnosis not present

## 2021-04-20 DIAGNOSIS — R55 Syncope and collapse: Secondary | ICD-10-CM | POA: Diagnosis present

## 2021-04-20 DIAGNOSIS — Z83438 Family history of other disorder of lipoprotein metabolism and other lipidemia: Secondary | ICD-10-CM | POA: Diagnosis not present

## 2021-04-20 DIAGNOSIS — I129 Hypertensive chronic kidney disease with stage 1 through stage 4 chronic kidney disease, or unspecified chronic kidney disease: Secondary | ICD-10-CM | POA: Diagnosis present

## 2021-04-20 DIAGNOSIS — Z8051 Family history of malignant neoplasm of kidney: Secondary | ICD-10-CM | POA: Diagnosis not present

## 2021-04-20 DIAGNOSIS — E860 Dehydration: Secondary | ICD-10-CM | POA: Diagnosis present

## 2021-04-20 LAB — CBC
HCT: 33.2 % — ABNORMAL LOW (ref 36.0–46.0)
Hemoglobin: 10 g/dL — ABNORMAL LOW (ref 12.0–15.0)
MCH: 29.1 pg (ref 26.0–34.0)
MCHC: 30.1 g/dL (ref 30.0–36.0)
MCV: 96.5 fL (ref 80.0–100.0)
Platelets: 161 10*3/uL (ref 150–400)
RBC: 3.44 MIL/uL — ABNORMAL LOW (ref 3.87–5.11)
RDW: 13.6 % (ref 11.5–15.5)
WBC: 5.7 10*3/uL (ref 4.0–10.5)
nRBC: 0 % (ref 0.0–0.2)

## 2021-04-20 LAB — PHOSPHORUS: Phosphorus: 4.6 mg/dL (ref 2.5–4.6)

## 2021-04-20 LAB — RENAL FUNCTION PANEL
Albumin: 3.5 g/dL (ref 3.5–5.0)
Anion gap: 8 (ref 5–15)
BUN: 15 mg/dL (ref 8–23)
CO2: 21 mmol/L — ABNORMAL LOW (ref 22–32)
Calcium: 9.1 mg/dL (ref 8.9–10.3)
Chloride: 115 mmol/L — ABNORMAL HIGH (ref 98–111)
Creatinine, Ser: 1.22 mg/dL — ABNORMAL HIGH (ref 0.44–1.00)
GFR, Estimated: 48 mL/min — ABNORMAL LOW (ref >60)
Glucose, Bld: 79 mg/dL (ref 70–99)
Phosphorus: 1.5 mg/dL — ABNORMAL LOW (ref 2.5–4.6)
Potassium: 5.4 mmol/L — ABNORMAL HIGH (ref 3.5–5.1)
Sodium: 144 mmol/L (ref 135–145)

## 2021-04-20 LAB — MAGNESIUM: Magnesium: 1.4 mg/dL — ABNORMAL LOW (ref 1.7–2.4)

## 2021-04-20 LAB — CORTISOL-AM, BLOOD: Cortisol - AM: 11.9 ug/dL (ref 6.7–22.6)

## 2021-04-20 MED ORDER — MAGNESIUM SULFATE 4 GM/100ML IV SOLN
4.0000 g | Freq: Once | INTRAVENOUS | Status: AC
  Administered 2021-04-20: 4 g via INTRAVENOUS
  Filled 2021-04-20: qty 100

## 2021-04-20 MED ORDER — SODIUM PHOSPHATES 45 MMOLE/15ML IV SOLN
45.0000 mmol | Freq: Once | INTRAVENOUS | Status: AC
  Administered 2021-04-20: 45 mmol via INTRAVENOUS
  Filled 2021-04-20: qty 15

## 2021-04-20 NOTE — Clinical Social Work Note (Signed)
Patient in observation. From home alone. Ambulates independently at baseline. Has cane in home. Does not drive, friend takes to appointments. Declines SNF. Agreeable to HHPT.  Referred and accepted to [facility] with [facility].    Ivee Poellnitz, Clydene Pugh, LCSW

## 2021-04-20 NOTE — Evaluation (Signed)
Physical Therapy Evaluation Patient Details Name: Joanna [name] MRN: [id number] DOB: 1951-05-23 Today's Date: 04/20/2021   History of Present Illness  Joanna [name]  is a 70 y.o. female with past medical history relevant for HTN, tobacco abuse, DM2, CKD 3B, idiopathic left upper extremity spastic hemiplegia, chronic hypokalemia hypomagnesemia despite replacements, hypothyroidism who presents to the ED with worsening dizziness and near syncopal episodes  -Dizziness has been worse since 04/17/2021, patient had emesis x1 without blood or bile  -No frank chest pains no palpitations  -No leg pains no leg swelling no pleuritic type symptoms  -Patient also had headache  -Additional history obtained from patient's friend [name] at bedside, also obtain additional history from patient's brother by phone  No fever  Or chills   Clinical Impression  Patient presents with splint to left hand and unable to use LUE for functional activity which has been baseline for 1 year per patient.  Patient demonstrates slow slightly labored movement for sitting up at bedside, transfers and ambulation in room/hallway without loss of balance.  Patient limited mostly due to c/o dizziness especially when standing or walking, demonstrates good return for transferring to commode in bathroom using grab bar and tolerated sitting up in chair after therapy - RN notified.  Patient will benefit from continued physical therapy in hospital and recommended venue below to increase strength, balance, endurance for safe ADLs and gait.     Follow Up Recommendations SNF;Supervision - Intermittent;Supervision for mobility/OOB    Equipment Recommendations  None recommended by PT    Recommendations for Other Services       Precautions / Restrictions Precautions Precautions: Fall Restrictions Weight Bearing Restrictions: No      Mobility  Bed Mobility Overal bed mobility: Needs Assistance Bed Mobility: Supine to Sit     Supine to sit:  Supervision;Modified independent (Device/Increase time)     General bed mobility comments: slightly labored movement    Transfers Overall transfer level: Needs assistance Equipment used: 1 person hand held assist;None Transfers: Sit to/from [facility] Sit to Stand: Min guard Stand pivot transfers: Min guard       General transfer comment: increased time, labored movement  Ambulation/Gait Ambulation/Gait assistance: Min guard Gait Distance (Feet): 45 Feet Assistive device: None;1 person hand held assist Gait Pattern/deviations: Decreased step length - right;Decreased step length - left;Decreased stride length Gait velocity: decreased   General Gait Details: slow labored unsteady cadence without loss of balance, limited mostly due to c/o dizziness  Stairs            Wheelchair Mobility    Modified Rankin (Stroke Patients Only)       Balance Overall balance assessment: Needs assistance Sitting-balance support: Feet supported;No upper extremity supported Sitting balance-Leahy Scale: Good Sitting balance - Comments: seated at EOB   Standing balance support: During functional activity;No upper extremity supported Standing balance-Leahy Scale: Fair Standing balance comment: without AD                             Pertinent Vitals/Pain Pain Assessment: Faces Faces Pain Scale: Hurts little more Pain Location: chronic left hand pain Pain Descriptors / Indicators: Aching;Guarding Pain Intervention(s): Limited activity within patient's tolerance;Monitored during session    [place] expects to be discharged to:: Private residence Living Arrangements: Alone Available Help at Discharge: Friend(s);Available PRN/intermittently Type of Home: Apartment Home Access: Level entry     Home Layout: One level Home Equipment: Kasandra Knudsen -  single point;Shower seat;Grab bars - tub/shower      Prior Function Level of Independence: Needs  assistance   Gait / Transfers Assistance Needed: household ambulator with SPC PRN  ADL's / Homemaking Assistance Needed: assisted by friend for community ADLs        Hand Dominance   Dominant Hand: Right    Extremity/Trunk Assessment   Upper Extremity Assessment Upper Extremity Assessment: Generalized weakness;RUE deficits/detail;LUE deficits/detail RUE Deficits / Details: grossly 4+/5 RUE Sensation: WNL RUE Coordination: WNL LUE Deficits / Details: grossly 0/5, left hand in splint, non-functional LUE: Unable to fully assess due to immobilization LUE Sensation: decreased proprioception LUE Coordination: decreased gross motor;decreased fine motor    Lower Extremity Assessment Lower Extremity Assessment: Generalized weakness    Cervical / Trunk Assessment Cervical / Trunk Assessment: Normal  Communication   Communication: No difficulties  Cognition Arousal/Alertness: Awake/alert Behavior During Therapy: WFL for tasks assessed/performed Overall Cognitive Status: Within Functional Limits for tasks assessed                                        General Comments      Exercises     Assessment/Plan    PT Assessment Patient needs continued PT services  PT Problem List Decreased strength;Decreased activity tolerance;Decreased balance;Decreased mobility       PT Treatment Interventions DME instruction;Gait training;Stair training;Functional mobility training;Therapeutic activities;Therapeutic exercise;Patient/family education;Balance training    PT Goals (Current goals can be found in the Care Plan section)  Acute Rehab PT Goals Patient Stated Goal: return home with friend to assist PT Goal Formulation: With patient Time For Goal Achievement: 05/04/21 Potential to Achieve Goals: Good    Frequency Min 3X/week   Barriers to discharge        Co-evaluation               AM-PAC PT "6 Clicks" Mobility  Outcome Measure Help needed turning  from your back to your side while in a flat bed without using bedrails?: None Help needed moving from lying on your back to sitting on the side of a flat bed without using bedrails?: None Help needed moving to and from a bed to a chair (including a wheelchair)?: A Little Help needed standing up from a chair using your arms (e.g., wheelchair or bedside chair)?: A Little Help needed to walk in hospital room?: A Little Help needed climbing 3-5 steps with a railing? : A Little 6 Click Score: 20    End of Session   Activity Tolerance: Patient tolerated treatment well;Patient limited by fatigue;Other (comment) (limited by dizziness) Patient left: in chair;with call bell/phone within reach Nurse Communication: Mobility status PT Visit Diagnosis: Unsteadiness on feet (R26.81);Other abnormalities of gait and mobility (R26.89);Muscle weakness (generalized) (M62.81)    Time: 0865-7846 PT Time Calculation (min) (ACUTE ONLY): 23 min   Charges:   PT Evaluation $PT Eval Moderate Complexity: 1 Mod PT Treatments $Therapeutic Activity: 23-37 mins        10:33 AM, 04/20/21 Lonell Grandchild, MPT Physical Therapist with [facility] [phone] office [phone] mobile phone

## 2021-04-20 NOTE — Progress Notes (Signed)
PROGRESS NOTE    Joanna [name]  DGL:[id number] DOB: Mar 06, 1951 DOA: 04/19/2021 PCP: [name], MD    Brief Narrative:  70 year old female with a history of hypertension diabetes, suspect chronic kidney disease stage IIIb, admitted with dizziness and near syncope.  She was noted to have significant electrolyte abnormalities with low magnesium and potassium.  Creatinine was also elevated.  She was admitted for further work-up.   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 (HCC)   Dizziness and giddiness   Hypomagnesemia   Hypokalemia   Near syncope -Possibly related to arrhythmias in the setting of severe electrolyte abnormalities -Echocardiogram and carotid Dopplers unremarkable -CT head without acute findings -Overall symptoms appear to have improved -Continue to monitor  Severe hypokalemia and hypomagnesemia -These are being replaced -Discontinue indapamide  Spastic hemiplegia of left upper extremity -Chronic finding -Follow up with need outpatient referral to neurology  Diabetes -A1c of 5.8 -Continue sliding scale insulin  Hypertension -Blood pressure stable on amlodipine  Hypothyroidism -Continue on levothyroxine  Chronic kidney disease stage IIIb -Creatinine has improved with hydration -Continue to hold losartan for now  Generalized weakness -Seen by physical therapy with recommendations for skilled nursing facility placement -Patient had mentioned to clinical social work that she would rather go home -When I had seen her earlier today, she was agreeable to go to skilled nursing facility -We will need to clarify what her wishes are   DVT prophylaxis: heparin injection 5,000 Units  Start: 04/19/21 1400 SCDs Start: 04/19/21 1306 Place TED hose Start: 04/19/21 1306  Code Status: Full code Family Communication: Discussed with patient Disposition Plan: Status is: Inpatient  The patient will require care spanning > 2 midnights and should be moved to inpatient because: Persistent severe electrolyte disturbances and IV treatments appropriate due to intensity of illness or inability to take PO  Dispo: The patient is from: Home              Anticipated d/c is to: Home vs. SNF              Patient currently is not medically stable to d/c.   Difficult to place patient No         Consultants:    Procedures:  Echocardiogram unremarkable  Antimicrobials:      Subjective: She is feeling better.  No dizziness.  No vomiting.  Objective: Vitals:   04/20/21 0009 04/20/21 0441 04/20/21 1159 04/20/21 2032  BP: 130/82 119/69 (!) 141/91 122/79  Pulse: 76 72 96 76  Resp: 18 19 16 19   Temp: 98.1 F (36.7 C) 98.3 F (36.8 C) 98.6 F (37 C) 97.6 F (36.4 C)  TempSrc: Oral Oral Oral Oral  SpO2: 100% 99% 100% 100%  Weight:      Height:        Intake/Output Summary (Last 24 hours) at 04/20/2021 2114 Last data filed at 04/20/2021 1900 Gross per 24 hour  Intake 1717.37 ml  Output 1000 ml  Net 717.37 ml   Filed Weights   04/19/21 1014  Weight: 59 kg    Examination:  General exam: Appears calm and comfortable  Respiratory system: Clear to auscultation. Respiratory effort normal. Cardiovascular system: S1 & S2 heard, RRR. No JVD, murmurs, rubs,  gallops or clicks. No pedal edema. Gastrointestinal system: Abdomen is nondistended, soft and nontender. No organomegaly or masses felt. Normal bowel sounds heard. Central nervous system: Alert and oriented. No focal neurological deficits. Extremities: Symmetric 5 x 5 power. Skin: No rashes, lesions or ulcers Psychiatry: Judgement and insight appear normal. Mood & affect appropriate.     Data Reviewed: I have  personally reviewed following labs and imaging studies  CBC: Recent Labs  Lab 04/19/21 1133 04/20/21 0528  WBC 8.3 5.7  NEUTROABS 6.3  --   HGB 10.6* 10.0*  HCT 32.8* 33.2*  MCV 90.4 96.5  PLT 181 381   Basic Metabolic Panel: Recent Labs  Lab 04/19/21 1133 04/20/21 0528 04/20/21 2002  NA 141  140 144  --   K 2.5*  2.4* 5.4*  --   CL 106  105 115*  --   CO2 23  23 21*  --   GLUCOSE 93  94 79  --   BUN 23  23 15   --   CREATININE 1.79*  1.70* 1.22*  --   CALCIUM 8.3*  8.2* 9.1  --   MG 0.4* 1.4*  --   PHOS 3.2 1.5* 4.6   GFR: Estimated Creatinine Clearance: 34.4 mL/min (A) (by C-G formula based on SCr of 1.22 mg/dL (H)). Liver Function Tests: Recent Labs  Lab 04/19/21 1133 04/20/21 0528  ALBUMIN 3.7 3.5   No results for input(s): LIPASE, AMYLASE in the last 168 hours. No results for input(s): AMMONIA in the last 168 hours. Coagulation Profile: No results for input(s): INR, PROTIME in the last 168 hours. Cardiac Enzymes: No results for input(s): CKTOTAL, CKMB, CKMBINDEX, TROPONINI in the last 168 hours. BNP (last 3 results) No results for input(s): PROBNP in the last 8760 hours. HbA1C: No results for input(s): HGBA1C in the last 72 hours. CBG: No results for input(s): GLUCAP in the last 168 hours. Lipid Profile: No results for input(s): CHOL, HDL, LDLCALC, TRIG, CHOLHDL, LDLDIRECT in the last 72 hours. Thyroid Function Tests: Recent Labs    04/19/21 1134  TSH 0.389   Anemia Panel: No results for input(s): VITAMINB12, FOLATE, FERRITIN, TIBC, IRON, RETICCTPCT in the last 72 hours. Sepsis Labs: No results for input(s): PROCALCITON, LATICACIDVEN in the last 168 hours.  Recent Results (from the past 240 hour(s))  Resp Panel by RT-PCR (Flu A&B, Covid) Nasopharyngeal Swab     Status: None   Collection Time: 04/19/21  2:39 PM   Specimen: Nasopharyngeal Swab; Nasopharyngeal(NP) swabs in vial transport medium  Result Value Ref Range Status   SARS  Coronavirus 2 by RT PCR NEGATIVE NEGATIVE Final    Comment: (NOTE) SARS-CoV-2 target nucleic acids are NOT DETECTED.  The SARS-CoV-2 RNA is generally detectable in upper respiratory specimens during the acute phase of infection. The lowest concentration of SARS-CoV-2 viral copies this assay can detect is 138 copies/mL. A negative result does not preclude SARS-Cov-2 infection and should not be used as the sole basis for treatment or other patient management decisions. A negative result may occur with  improper specimen collection/handling, submission of specimen other than nasopharyngeal swab, presence of viral mutation(s) within the areas targeted by this assay, and inadequate number of viral copies(<138 copies/mL). A negative result must be combined with clinical observations, patient history, and epidemiological information. The expected result is Negative.  Fact Sheet for Patients:  EntrepreneurPulse.com.au  Fact Sheet for Healthcare Providers:  IncredibleEmployment.be  This test is no t yet approved or cleared by the [place]  FDA and  has been authorized for detection and/or diagnosis of SARS-CoV-2 by FDA under an Emergency Use Authorization (EUA). This EUA will remain  in effect (meaning this test can be used) for the duration of the COVID-19 declaration under Section 564(b)(1) of the Act, 21 U.S.C.section 360bbb-3(b)(1), unless the authorization is terminated  or revoked sooner.       Influenza A by PCR NEGATIVE NEGATIVE Final   Influenza B by PCR NEGATIVE NEGATIVE Final    Comment: (NOTE) The Xpert Xpress SARS-CoV-2/FLU/RSV plus assay is intended as an aid in the diagnosis of influenza from Nasopharyngeal swab specimens and should not be used as a sole basis for treatment. Nasal washings and aspirates are unacceptable for Xpert Xpress SARS-CoV-2/FLU/RSV testing.  Fact Sheet for  Patients: EntrepreneurPulse.com.au  Fact Sheet for Healthcare Providers: IncredibleEmployment.be  This test is not yet approved or cleared by the [place] FDA and has been authorized for detection and/or diagnosis of SARS-CoV-2 by FDA under an Emergency Use Authorization (EUA). This EUA will remain in effect (meaning this test can be used) for the duration of the COVID-19 declaration under Section 564(b)(1) of the Act, 21 U.S.C. section 360bbb-3(b)(1), unless the authorization is terminated or revoked.  Performed at [facility], [address], [place], [place] [address]          Radiology Studies: CT Head Wo Contrast  Result Date: 04/19/2021 CLINICAL DATA:  Dizziness, headache. EXAM: CT HEAD WITHOUT CONTRAST TECHNIQUE: Contiguous axial images were obtained from the base of the skull through the vertex without intravenous contrast. COMPARISON:  None. FINDINGS: Brain: Mild chronic ischemic white matter disease is noted. No mass effect or midline shift is noted. Ventricular size is within normal limits. There is no evidence of mass lesion, hemorrhage or acute infarction. Vascular: No hyperdense vessel or unexpected calcification. Skull: Normal. Negative for fracture or focal lesion. Sinuses/Orbits: No acute finding. Other: None. IMPRESSION: No acute intracranial abnormality seen. Electronically Signed   By: Marijo Conception M.D.   On: 04/19/2021 12:37   US Carotid Bilateral  Result Date: 04/20/2021 CLINICAL DATA:  Syncope and collapse EXAM: BILATERAL CAROTID DUPLEX ULTRASOUND TECHNIQUE: Pearline Cables scale imaging, color Doppler and duplex ultrasound were performed of bilateral carotid and vertebral arteries in the neck. COMPARISON:  None. FINDINGS: Criteria: Quantification of carotid stenosis is based on velocity parameters that correlate the residual internal carotid diameter with NASCET-based stenosis levels, using the diameter of the distal internal carotid  lumen as the denominator for stenosis measurement. The following velocity measurements were obtained: RIGHT ICA: 44/15 cm/sec CCA: 29/92 cm/sec SYSTOLIC ICA/CCA RATIO:  1.1 ECA: 50 cm/sec LEFT ICA: 47/20 cm/sec CCA: 42/68 cm/sec SYSTOLIC ICA/CCA RATIO:  1.1 ECA: 47 cm/sec RIGHT CAROTID ARTERY: No significant atheromatous plaque. RIGHT VERTEBRAL ARTERY:  Antegrade flow. LEFT CAROTID ARTERY:  No significant atheromatous plaque. LEFT VERTEBRAL ARTERY:  Antegrade flow. IMPRESSION: No significant stenosis of internal carotid arteries. Electronically Signed   By: Miachel Roux M.D.   On: 04/20/2021 08:59   ECHOCARDIOGRAM COMPLETE  Result Date: 04/19/2021    ECHOCARDIOGRAM REPORT   Patient Name:   Cymone R Crusoe Date of Exam: 04/19/2021 Medical Rec #:  [id number]    Height:       62.0 in Accession #:    [id number]   Weight:       130.0 lb Date of Birth:  1951/06/13    BSA:          1.592 m Patient Age:    35  years     BP:           145/95 mmHg Patient Gender: F            HR:           86 bpm. Exam Location:  [facility] Procedure: 2D Echo, Cardiac Doppler and Color Doppler Indications:    Abnormal ECG  History:        Patient has no prior history of Echocardiogram examinations.                 CAD, Arrythmias:LBBB; Risk Factors:Hypertension, Diabetes and                 Dyslipidemia.  Sonographer:    Wenda Low Referring Phys: [place]  1. Left ventricular ejection fraction, by estimation, is 60 to 65%. The left ventricle has normal function. The left ventricle has no regional wall motion abnormalities. Left ventricular diastolic parameters were normal.  2. Right ventricular systolic function is normal. The right ventricular size is normal. There is normal pulmonary artery systolic pressure.  3. The mitral valve is normal in structure. Trivial mitral valve regurgitation. No evidence of mitral stenosis.  4. The aortic valve is tricuspid. There is mild calcification of the aortic valve. Aortic  valve regurgitation is trivial. Mild aortic valve sclerosis is present, with no evidence of aortic valve stenosis.  5. Aortic dilatation noted. There is mild dilatation of the aortic root, measuring 39 mm.  6. The inferior vena cava is normal in size with greater than 50% respiratory variability, suggesting right atrial pressure of 3 mmHg. FINDINGS  Left Ventricle: Left ventricular ejection fraction, by estimation, is 60 to 65%. The left ventricle has normal function. The left ventricle has no regional wall motion abnormalities. The left ventricular internal cavity size was normal in size. There is  no left ventricular hypertrophy. Left ventricular diastolic parameters were normal. Right Ventricle: The right ventricular size is normal. No increase in right ventricular wall thickness. Right ventricular systolic function is normal. There is normal pulmonary artery systolic pressure. The tricuspid regurgitant velocity is 2.56 m/s, and  with an assumed right atrial pressure of 3 mmHg, the estimated right ventricular systolic pressure is 06.3 mmHg. Left Atrium: Left atrial size was normal in size. Right Atrium: Right atrial size was normal in size. Pericardium: There is no evidence of pericardial effusion. Mitral Valve: The mitral valve is normal in structure. Trivial mitral valve regurgitation. No evidence of mitral valve stenosis. MV peak gradient, 4.8 mmHg. The mean mitral valve gradient is 1.0 mmHg. Tricuspid Valve: The tricuspid valve is normal in structure. Tricuspid valve regurgitation is mild . No evidence of tricuspid stenosis. Aortic Valve: The aortic valve is tricuspid. There is mild calcification of the aortic valve. Aortic valve regurgitation is trivial. Mild aortic valve sclerosis is present, with no evidence of aortic valve stenosis. Aortic valve mean gradient measures 3.0 mmHg. Aortic valve peak gradient measures 8.2 mmHg. Aortic valve area, by VTI measures 2.80 cm. Pulmonic Valve: The pulmonic valve was  normal in structure. Pulmonic valve regurgitation is not visualized. No evidence of pulmonic stenosis. Aorta: The aortic root is normal in size and structure and aortic dilatation noted. There is mild dilatation of the aortic root, measuring 39 mm. Venous: The inferior vena cava is normal in size with greater than 50% respiratory variability, suggesting right atrial pressure of 3 mmHg. IAS/Shunts: No atrial level shunt detected by color flow Doppler.  LEFT VENTRICLE PLAX  2D LVIDd:         4.39 cm  Diastology LVIDs:         2.90 cm  LV e' medial:    11.50 cm/s LV PW:         1.14 cm  LV E/e' medial:  7.6 LV IVS:        0.98 cm  LV e' lateral:   10.10 cm/s LVOT diam:     2.00 cm  LV E/e' lateral: 8.6 LV SV:         81 LV SV Index:   51 LVOT Area:     3.14 cm  RIGHT VENTRICLE RV Basal diam:  2.66 cm RV Mid diam:    2.44 cm RV S prime:     11.60 cm/s TAPSE (M-mode): 2.3 cm LEFT ATRIUM             Index       RIGHT ATRIUM           Index LA diam:        3.30 cm 2.07 cm/m  RA Area:     12.00 cm LA Vol (A2C):   44.5 ml 27.95 ml/m RA Volume:   24.60 ml  15.45 ml/m LA Vol (A4C):   38.7 ml 24.31 ml/m LA Biplane Vol: 43.4 ml 27.26 ml/m  AORTIC VALVE AV Area (Vmax):    2.83 cm AV Area (Vmean):   2.84 cm AV Area (VTI):     2.80 cm AV Vmax:           143.00 cm/s AV Vmean:          83.400 cm/s AV VTI:            0.291 m AV Peak Grad:      8.2 mmHg AV Mean Grad:      3.0 mmHg LVOT Vmax:         129.00 cm/s LVOT Vmean:        75.300 cm/s LVOT VTI:          0.259 m LVOT/AV VTI ratio: 0.89  AORTA Ao Root diam: 3.50 cm Ao Asc diam:  3.90 cm MITRAL VALVE                TRICUSPID VALVE MV Area (PHT): 3.61 cm     TR Peak grad:   26.2 mmHg MV Area VTI:   2.00 cm     TR Vmax:        256.00 cm/s MV Peak grad:  4.8 mmHg MV Mean grad:  1.0 mmHg     SHUNTS MV Vmax:       1.10 m/s     Systemic VTI:  0.26 m MV Vmean:      51.0 cm/s    Systemic Diam: 2.00 cm MV Decel Time: 210 msec MV E velocity: 87.30 cm/s MV A velocity: 116.00 cm/s  MV E/A ratio:  0.75 Jenkins Rouge MD Electronically signed by Jenkins Rouge MD Signature Date/Time: 04/19/2021/6:23:17 PM    Final         Scheduled Meds:  amLODipine  5 mg Oral Daily   ferrous sulfate  325 mg Oral Q breakfast   folic acid  1 mg Oral Daily   heparin  5,000 Units Subcutaneous Q8H   levothyroxine  88 mcg Oral QAC breakfast   loratadine  10 mg Oral Daily   pantoprazole  40 mg Oral Daily   rosuvastatin  5 mg Oral q1800   sodium chloride flush  3 mL Intravenous Q12H   sodium chloride flush  3 mL Intravenous Q12H   topiramate  100 mg Oral BID   vitamin B-12  1,000 mcg Oral Daily   Continuous Infusions:  sodium chloride       LOS: 0 days    Time spent: 52mins    Kathie Dike, MD [facility]   If 7PM-7AM, please contact night-coverage [email or web address]  04/20/2021, 9:14 PM

## 2021-04-20 NOTE — Plan of Care (Signed)
  Problem: Acute Rehab PT Goals(only PT should resolve) Goal: Pt Will Go Supine/Side To Sit Outcome: Progressing Flowsheets (Taken 04/20/2021 1035) Pt will go Supine/Side to Sit:  Independently  with modified independence Goal: Patient Will Transfer Sit To/From Stand Outcome: Progressing Flowsheets (Taken 04/20/2021 1035) Patient will transfer sit to/from stand:  with modified independence  with supervision Goal: Pt Will Transfer Bed To Chair/Chair To Bed Outcome: Progressing Flowsheets (Taken 04/20/2021 1035) Pt will Transfer Bed to Chair/Chair to Bed:  with modified independence  with supervision Goal: Pt Will Ambulate Outcome: Progressing Flowsheets (Taken 04/20/2021 1035) Pt will Ambulate:  75 feet  with modified independence  with supervision  with least restrictive assistive device   10:35 AM, 04/20/21 Lonell Grandchild, MPT Physical Therapist with [facility] [phone] office [phone] mobile phone

## 2021-04-20 NOTE — Care Management Obs Status (Signed)
[facility] NOTIFICATION   Patient Details  Name: ATIRA BORELLO MRN: [id number] Date of Birth: [birth year]   Medicare Observation Status Notification Given:  Yes    Ihor Gully, LCSW 04/20/2021, 4:59 PM

## 2021-04-21 ENCOUNTER — Institutional Professional Consult (permissible substitution): Payer: PPO | Admitting: Neurology

## 2021-04-21 DIAGNOSIS — E039 Hypothyroidism, unspecified: Secondary | ICD-10-CM

## 2021-04-21 LAB — CBC
HCT: 34.3 % — ABNORMAL LOW (ref 36.0–46.0)
Hemoglobin: 10.7 g/dL — ABNORMAL LOW (ref 12.0–15.0)
MCH: 29 pg (ref 26.0–34.0)
MCHC: 31.2 g/dL (ref 30.0–36.0)
MCV: 93 fL (ref 80.0–100.0)
Platelets: 192 10*3/uL (ref 150–400)
RBC: 3.69 MIL/uL — ABNORMAL LOW (ref 3.87–5.11)
RDW: 13.5 % (ref 11.5–15.5)
WBC: 4.7 10*3/uL (ref 4.0–10.5)
nRBC: 0 % (ref 0.0–0.2)

## 2021-04-21 LAB — BASIC METABOLIC PANEL
Anion gap: 7 (ref 5–15)
BUN: 13 mg/dL (ref 8–23)
CO2: 21 mmol/L — ABNORMAL LOW (ref 22–32)
Calcium: 9.6 mg/dL (ref 8.9–10.3)
Chloride: 110 mmol/L (ref 98–111)
Creatinine, Ser: 1.32 mg/dL — ABNORMAL HIGH (ref 0.44–1.00)
GFR, Estimated: 44 mL/min — ABNORMAL LOW (ref >60)
Glucose, Bld: 83 mg/dL (ref 70–99)
Potassium: 4.4 mmol/L (ref 3.5–5.1)
Sodium: 138 mmol/L (ref 135–145)

## 2021-04-21 LAB — IRON AND TIBC
Iron: 77 ug/dL (ref 28–170)
Saturation Ratios: 38 % — ABNORMAL HIGH (ref 10.4–31.8)
TIBC: 202 ug/dL — ABNORMAL LOW (ref 250–450)
UIBC: 125 ug/dL

## 2021-04-21 LAB — FERRITIN: Ferritin: 536 ng/mL — ABNORMAL HIGH (ref 11–307)

## 2021-04-21 LAB — MAGNESIUM: Magnesium: 2.2 mg/dL (ref 1.7–2.4)

## 2021-04-21 NOTE — Progress Notes (Signed)
Physical Therapy Treatment Patient Details Name: Joanna [name] MRN: [id number] DOB: 01-15-1951 Today's Date: 04/21/2021    History of Present Illness Joanna [name]  is a 70 y.o. female with past medical history relevant for HTN, tobacco abuse, DM2, CKD 3B, idiopathic left upper extremity spastic hemiplegia, chronic hypokalemia hypomagnesemia despite replacements, hypothyroidism who presents to the ED with worsening dizziness and near syncopal episodes  -Dizziness has been worse since 04/17/2021, patient had emesis x1 without blood or bile  -No frank chest pains no palpitations  -No leg pains no leg swelling no pleuritic type symptoms  -Patient also had headache  -Additional history obtained from patient's friend [name] at bedside, also obtain additional history from patient's brother by phone  No fever  Or chills    PT Comments    Patient c/o mild dizziness upon siting up at bedside and encouraged to sit up for a few minutes before standing to lessen symptom, then dizziness resovled after 2-3 minutes of sitting.  Patient demonstrates increased endurance/distance for ambulation without c/o dizziness or loss of balance and tolerated staying up in chair after therapy.  Patient will benefit from continued physical therapy in hospital and recommended venue below to increase strength, balance, endurance for safe ADLs and gait.    Follow Up Recommendations  Supervision - Intermittent;Home health PT     Equipment Recommendations  None recommended by PT    Recommendations for Other Services       Precautions / Restrictions Precautions Precautions: None Restrictions Weight Bearing Restrictions: No    Mobility  Bed Mobility Overal bed mobility: Modified Independent                  Transfers Overall transfer level: Modified independent                  Ambulation/Gait Ambulation/Gait assistance: Supervision Gait Distance (Feet): 80 Feet Assistive device: None Gait  Pattern/deviations: Decreased step length - right;Decreased step length - left;Decreased stride length;Drifts right/left Gait velocity: decreased   General Gait Details: increased endurance/distance for gait training without loss of balance, decreased left arm swing due to paralysis, mild drifting right/left, no c/o dizziness and limited mostly due to fatigue   Stairs             Wheelchair Mobility    Modified Rankin (Stroke Patients Only)       Balance Overall balance assessment: Needs assistance Sitting-balance support: Feet supported;No upper extremity supported Sitting balance-Leahy Scale: Good Sitting balance - Comments: seated at EOB   Standing balance support: During functional activity;No upper extremity supported Standing balance-Leahy Scale: Fair Standing balance comment: fair/good without AD                            Cognition Arousal/Alertness: Awake/alert Behavior During Therapy: WFL for tasks assessed/performed Overall Cognitive Status: Within Functional Limits for tasks assessed                                        Exercises General Exercises - Lower Extremity Long Arc Quad: Seated;AROM;Strengthening;Both;10 reps Hip Flexion/Marching: Seated;AROM;Strengthening;Both;10 reps Toe Raises: Seated;AROM;Strengthening;Both;10 reps Heel Raises: Seated;AROM;Strengthening;Both;10 reps    General Comments        Pertinent Vitals/Pain Pain Assessment: Faces Faces Pain Scale: Hurts a little bit Pain Location: chronic left hand pain Pain Descriptors / Indicators: Aching;Guarding Pain Intervention(s): Limited activity  within patient's tolerance;Monitored during session    Home Living                      Prior Function            PT Goals (current goals can now be found in the care plan section) Acute Rehab PT Goals Patient Stated Goal: return home with friend to assist PT Goal Formulation: With patient Time For  Goal Achievement: 04/22/21 Potential to Achieve Goals: Good Progress towards PT goals: Progressing toward goals    Frequency    Min 3X/week      PT Plan Discharge plan needs to be updated    Co-evaluation              AM-PAC PT "6 Clicks" Mobility   Outcome Measure  Help needed turning from your back to your side while in a flat bed without using bedrails?: None Help needed moving from lying on your back to sitting on the side of a flat bed without using bedrails?: None Help needed moving to and from a bed to a chair (including a wheelchair)?: None Help needed standing up from a chair using your arms (e.g., wheelchair or bedside chair)?: None Help needed to walk in hospital room?: A Little Help needed climbing 3-5 steps with a railing? : A Little 6 Click Score: 22    End of Session   Activity Tolerance: Patient tolerated treatment well;Patient limited by fatigue Patient left: in chair;with call bell/phone within reach Nurse Communication: Mobility status PT Visit Diagnosis: Unsteadiness on feet (R26.81);Other abnormalities of gait and mobility (R26.89);Muscle weakness (generalized) (M62.81)     Time: 9290-9030 PT Time Calculation (min) (ACUTE ONLY): 21 min  Charges:  $Gait Training: 8-22 mins $Therapeutic Exercise: 8-22 mins                     12:10 PM, 04/21/21 Lonell Grandchild, MPT Physical Therapist with [facility] [phone] office [phone] mobile phone

## 2021-04-21 NOTE — Discharge Summary (Signed)
Physician Discharge Summary  Joanna [name] LPF:[id number] DOB: [birth year] DOA: 04/19/2021  PCP: [name], MD  Admit date: 04/19/2021 Discharge date: 04/21/2021  Admitted From: Home Disposition: Home  Recommendations for Outpatient Follow-up:  Follow up with PCP in 1-2 weeks Please obtain BMP/CBC in one week Continue follow-up with neurology as an outpatient for left-sided weakness  Home Health: Home health PT Equipment/Devices:  Discharge Condition: Stable CODE STATUS: Full code Diet recommendation: Heart healthy  Brief/Interim Summary: 70 year old female with a history of hypertension diabetes, suspect chronic kidney disease stage IIIb, admitted with dizziness and near syncope.  She was noted to have significant electrolyte abnormalities with low magnesium and potassium.  Creatinine was also elevated.  She was admitted for further work-up.  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 (HCC)   Dizziness and giddiness   Hypomagnesemia   Hypokalemia  Near syncope -Possibly related to arrhythmias in the setting of severe electrolyte abnormalities versus dehydration -Echocardiogram and carotid Dopplers unremarkable -CT head without acute findings -Overall symptoms appear to have improved -Continue to monitor   Severe hypokalemia and hypomagnesemia -These were corrected with replacement therapy -Discontinue indapamide   Spastic hemiplegia of left upper extremity -Chronic finding -She is followed by neurology in the outpatient setting   Diabetes -A1c of 5.8 -Blood sugars remained stable on sliding scale   Hypertension -Blood pressure stable on amlodipine   Hypothyroidism -Continue on levothyroxine    Chronic kidney disease stage IIIb -Creatinine has improved with hydration -Continue to hold losartan for now   Generalized weakness -Seen by physical therapy with recommendations for home health physical therapy  Discharge Instructions  Discharge Instructions     Diet - low sodium heart healthy   Complete by: As directed    Increase activity slowly   Complete by: As directed       Allergies as of 04/21/2021       Reactions   Ivp Dye [iodinated Diagnostic Agents]    Zoloft [sertraline Hcl]    Withdrawn,bad dreams        Medication List     STOP taking these medications    indapamide 1.25 MG tablet Commonly known as: LOZOL   losartan 100 MG tablet Commonly known as: COZAAR       TAKE these medications    acetaminophen 500 MG tablet Commonly known as: TYLENOL Take 1,000 mg by mouth every 8 (eight) hours as needed for moderate pain.   albuterol 108 (90 Base) MCG/ACT inhaler Commonly known as: VENTOLIN HFA Inhale 2 puffs into the lungs every 4 (four) hours as needed for wheezing or shortness of breath.   allopurinol 100 MG tablet Commonly known as: ZYLOPRIM Take 1 tablet (100 mg total) by mouth daily. What changed:  when to take this reasons to take this   amLODipine 5 MG tablet Commonly known as: NORVASC Take 1 tablet (5 mg total) by mouth daily.   colchicine 0.6 MG tablet TAKE 1 TABLET BY MOUTH TWICE DAILY AS NEEDED FOR GOUT What changed:  how much to take how to take this when to take this reasons to take this additional instructions   ferrous sulfate 325 (65 FE) MG tablet Take 325 mg by mouth daily with breakfast.   fexofenadine 180  MG tablet Commonly known as: ALLEGRA Take 180 mg by mouth daily.   fluticasone 50 MCG/ACT nasal spray Commonly known as: FLONASE PLACE 2 SPRAYS INTO THE NOSE AS NEEDED. What changed:  how much to take how to take this when to take this reasons to take this additional instructions   folic acid 1 MG  tablet Commonly known as: FOLVITE TAKE 1 TABLET(1 MG) BY MOUTH DAILY What changed: See the new instructions.   GAS-X PO Take 1 capsule by mouth as needed (gas/bloating.).   levothyroxine 88 MCG tablet Commonly known as: SYNTHROID Take 1 tablet (88 mcg total) by mouth daily before breakfast.   Magnesium 250 MG Tabs Take 250 mg by mouth daily.   omeprazole 40 MG capsule Commonly known as: PRILOSEC Take 1 capsule (40 mg total) by mouth daily.   potassium chloride 10 MEQ tablet Commonly known as: KLOR-CON Take 3 tablets po each morning and 3 tablet po each evening What changed:  how much to take how to take this when to take this   rosuvastatin 20 MG tablet Commonly known as: CRESTOR Take 1 tablet by mouth daily (Stop Pravastatin)   tiZANidine 2 MG tablet Commonly known as: ZANAFLEX TAKE 1 TABLET BY MOUTH THREE TIMES DAILY **MUST MAKE APPT FOR CONTINUED REFILLS** 12/27/20 What changed:  how much to take how to take this when to take this additional instructions   topiramate 100 MG tablet Commonly known as: TOPAMAX Take 1 tablet (100 mg total) by mouth 2 (two) times daily.   traZODone 50 MG tablet Commonly known as: DESYREL TAKE 1/2 TO 1 TABLET BY MOUTH EVERY NIGHT FOR SLEEP What changed:  how much to take how to take this when to take this additional instructions   Trulicity 7.34 LP/3.7TK Sopn Generic drug: Dulaglutide Inject 0.75 mg into the skin once a week.   VITAMIN B-12 PO Take 1 tablet by mouth daily.        Follow-up Information     [name], [name], MD. Schedule an appointment as soon as possible for a visit in 2 week(s).   Specialty: Family Medicine Contact information: [place] [address] [phone]                Allergies  Allergen Reactions   Ivp Dye [Iodinated Diagnostic Agents]    Zoloft [Sertraline Hcl]     Withdrawn,bad dreams    Consultations:    Procedures/Studies: CT Head Wo  Contrast  Result Date: 04/19/2021 CLINICAL DATA:  Dizziness, headache. EXAM: CT HEAD WITHOUT CONTRAST TECHNIQUE: Contiguous axial images were obtained from the base of the skull through the vertex without intravenous contrast. COMPARISON:  None. FINDINGS: Brain: Mild chronic ischemic white matter disease is noted. No mass effect or midline shift is noted. Ventricular size is within normal limits. There is no evidence of mass lesion, hemorrhage or acute infarction. Vascular: No hyperdense vessel or unexpected calcification. Skull: Normal. Negative for fracture or focal lesion. Sinuses/Orbits: No acute finding. Other: None. IMPRESSION: No acute intracranial abnormality seen. Electronically Signed   By: Marijo Conception M.D.   On: 04/19/2021 12:37   US Carotid Bilateral  Result Date: 04/20/2021 CLINICAL DATA:  Syncope and collapse EXAM: BILATERAL CAROTID DUPLEX ULTRASOUND TECHNIQUE: Pearline Cables scale imaging, color Doppler and duplex ultrasound were performed of bilateral carotid and vertebral arteries in the neck. COMPARISON:  None. FINDINGS: Criteria: Quantification of carotid stenosis is based on velocity parameters that correlate the residual internal carotid diameter with NASCET-based  stenosis levels, using the diameter of the distal internal carotid lumen as the denominator for stenosis measurement. The following velocity measurements were obtained: RIGHT ICA: 44/15 cm/sec CCA: 96/22 cm/sec SYSTOLIC ICA/CCA RATIO:  1.1 ECA: 50 cm/sec LEFT ICA: 47/20 cm/sec CCA: 29/79 cm/sec SYSTOLIC ICA/CCA RATIO:  1.1 ECA: 47 cm/sec RIGHT CAROTID ARTERY: No significant atheromatous plaque. RIGHT VERTEBRAL ARTERY:  Antegrade flow. LEFT CAROTID ARTERY:  No significant atheromatous plaque. LEFT VERTEBRAL ARTERY:  Antegrade flow. IMPRESSION: No significant stenosis of internal carotid arteries. Electronically Signed   By: Miachel Roux M.D.   On: 04/20/2021 08:59   ECHOCARDIOGRAM COMPLETE  Result Date: 04/19/2021    ECHOCARDIOGRAM  REPORT   Patient Name:   Joanna [name] Date of Exam: 04/19/2021 Medical Rec #:  [id number]    Height:       62.0 in Accession #:    [id number]   Weight:       130.0 lb Date of Birth:  [birth year]    BSA:          1.592 m Patient Age:    59 years     BP:           145/95 mmHg Patient Gender: F            HR:           86 bpm. Exam Location:  [facility] Procedure: 2D Echo, Cardiac Doppler and Color Doppler Indications:    Abnormal ECG  History:        Patient has no prior history of Echocardiogram examinations.                 CAD, Arrythmias:LBBB; Risk Factors:Hypertension, Diabetes and                 Dyslipidemia.  Sonographer:    Wenda Low Referring Phys: [place]  1. Left ventricular ejection fraction, by estimation, is 60 to 65%. The left ventricle has normal function. The left ventricle has no regional wall motion abnormalities. Left ventricular diastolic parameters were normal.  2. Right ventricular systolic function is normal. The right ventricular size is normal. There is normal pulmonary artery systolic pressure.  3. The mitral valve is normal in structure. Trivial mitral valve regurgitation. No evidence of mitral stenosis.  4. The aortic valve is tricuspid. There is mild calcification of the aortic valve. Aortic valve regurgitation is trivial. Mild aortic valve sclerosis is present, with no evidence of aortic valve stenosis.  5. Aortic dilatation noted. There is mild dilatation of the aortic root, measuring 39 mm.  6. The inferior vena cava is normal in size with greater than 50% respiratory variability, suggesting right atrial pressure of 3 mmHg. FINDINGS  Left Ventricle: Left ventricular ejection fraction, by estimation, is 60 to 65%. The left ventricle has normal function. The left ventricle has no regional wall motion abnormalities. The left ventricular internal cavity size was normal in size. There is  no left ventricular hypertrophy. Left ventricular diastolic parameters  were normal. Right Ventricle: The right ventricular size is normal. No increase in right ventricular wall thickness. Right ventricular systolic function is normal. There is normal pulmonary artery systolic pressure. The tricuspid regurgitant velocity is 2.56 m/s, and  with an assumed right atrial pressure of 3 mmHg, the estimated right ventricular systolic pressure is 63.1 mmHg. Left Atrium: Left atrial size was normal in size. Right Atrium: Right atrial size was normal in size. Pericardium: There is no evidence of pericardial effusion.  Mitral Valve: The mitral valve is normal in structure. Trivial mitral valve regurgitation. No evidence of mitral valve stenosis. MV peak gradient, 4.8 mmHg. The mean mitral valve gradient is 1.0 mmHg. Tricuspid Valve: The tricuspid valve is normal in structure. Tricuspid valve regurgitation is mild . No evidence of tricuspid stenosis. Aortic Valve: The aortic valve is tricuspid. There is mild calcification of the aortic valve. Aortic valve regurgitation is trivial. Mild aortic valve sclerosis is present, with no evidence of aortic valve stenosis. Aortic valve mean gradient measures 3.0 mmHg. Aortic valve peak gradient measures 8.2 mmHg. Aortic valve area, by VTI measures 2.80 cm. Pulmonic Valve: The pulmonic valve was normal in structure. Pulmonic valve regurgitation is not visualized. No evidence of pulmonic stenosis. Aorta: The aortic root is normal in size and structure and aortic dilatation noted. There is mild dilatation of the aortic root, measuring 39 mm. Venous: The inferior vena cava is normal in size with greater than 50% respiratory variability, suggesting right atrial pressure of 3 mmHg. IAS/Shunts: No atrial level shunt detected by color flow Doppler.  LEFT VENTRICLE PLAX 2D LVIDd:         4.39 cm  Diastology LVIDs:         2.90 cm  LV e' medial:    11.50 cm/s LV PW:         1.14 cm  LV E/e' medial:  7.6 LV IVS:        0.98 cm  LV e' lateral:   10.10 cm/s LVOT diam:      2.00 cm  LV E/e' lateral: 8.6 LV SV:         81 LV SV Index:   51 LVOT Area:     3.14 cm  RIGHT VENTRICLE RV Basal diam:  2.66 cm RV Mid diam:    2.44 cm RV S prime:     11.60 cm/s TAPSE (M-mode): 2.3 cm LEFT ATRIUM             Index       RIGHT ATRIUM           Index LA diam:        3.30 cm 2.07 cm/m  RA Area:     12.00 cm LA Vol (A2C):   44.5 ml 27.95 ml/m RA Volume:   24.60 ml  15.45 ml/m LA Vol (A4C):   38.7 ml 24.31 ml/m LA Biplane Vol: 43.4 ml 27.26 ml/m  AORTIC VALVE AV Area (Vmax):    2.83 cm AV Area (Vmean):   2.84 cm AV Area (VTI):     2.80 cm AV Vmax:           143.00 cm/s AV Vmean:          83.400 cm/s AV VTI:            0.291 m AV Peak Grad:      8.2 mmHg AV Mean Grad:      3.0 mmHg LVOT Vmax:         129.00 cm/s LVOT Vmean:        75.300 cm/s LVOT VTI:          0.259 m LVOT/AV VTI ratio: 0.89  AORTA Ao Root diam: 3.50 cm Ao Asc diam:  3.90 cm MITRAL VALVE                TRICUSPID VALVE MV Area (PHT): 3.61 cm     TR Peak grad:   26.2 mmHg MV Area VTI:  2.00 cm     TR Vmax:        256.00 cm/s MV Peak grad:  4.8 mmHg MV Mean grad:  1.0 mmHg     SHUNTS MV Vmax:       1.10 m/s     Systemic VTI:  0.26 m MV Vmean:      51.0 cm/s    Systemic Diam: 2.00 cm MV Decel Time: 210 msec MV E velocity: 87.30 cm/s MV A velocity: 116.00 cm/s MV E/A ratio:  0.75 Jenkins Rouge MD Electronically signed by Jenkins Rouge MD Signature Date/Time: 04/19/2021/6:23:17 PM    Final       Subjective: Patient is feeling better today.  She denies any dizziness.  No chest pain.  Discharge Exam: Vitals:   04/20/21 0441 04/20/21 1159 04/20/21 2032 04/21/21 0455  BP: 119/69 (!) 141/91 122/79 120/77  Pulse: 72 96 76 78  Resp: 19 16 19 20   Temp: 98.3 F (36.8 C) 98.6 F (37 C) 97.6 F (36.4 C) 98 F (36.7 C)  TempSrc: Oral Oral Oral   SpO2: 99% 100% 100% 100%  Weight:      Height:        General: Pt is alert, awake, not in acute distress Cardiovascular: RRR, S1/S2 +, no rubs, no gallops Respiratory: CTA  bilaterally, no wheezing, no rhonchi Abdominal: Soft, NT, ND, bowel sounds + Extremities: no edema, no cyanosis    The results of significant diagnostics from this hospitalization (including imaging, microbiology, ancillary and laboratory) are listed below for reference.     Microbiology: Recent Results (from the past 240 hour(s))  Resp Panel by RT-PCR (Flu A&B, Covid) Nasopharyngeal Swab     Status: None   Collection Time: 04/19/21  2:39 PM   Specimen: Nasopharyngeal Swab; Nasopharyngeal(NP) swabs in vial transport medium  Result Value Ref Range Status   SARS Coronavirus 2 by RT PCR NEGATIVE NEGATIVE Final    Comment: (NOTE) SARS-CoV-2 target nucleic acids are NOT DETECTED.  The SARS-CoV-2 RNA is generally detectable in upper respiratory specimens during the acute phase of infection. The lowest concentration of SARS-CoV-2 viral copies this assay can detect is 138 copies/mL. A negative result does not preclude SARS-Cov-2 infection and should not be used as the sole basis for treatment or other patient management decisions. A negative result may occur with  improper specimen collection/handling, submission of specimen other than nasopharyngeal swab, presence of viral mutation(s) within the areas targeted by this assay, and inadequate number of viral copies(<138 copies/mL). A negative result must be combined with clinical observations, patient history, and epidemiological information. The expected result is Negative.  Fact Sheet for Patients:  EntrepreneurPulse.com.au  Fact Sheet for Healthcare Providers:  IncredibleEmployment.be  This test is no t yet approved or cleared by the [place] FDA and  has been authorized for detection and/or diagnosis of SARS-CoV-2 by FDA under an Emergency Use Authorization (EUA). This EUA will remain  in effect (meaning this test can be used) for the duration of the COVID-19 declaration under Section  564(b)(1) of the Act, 21 U.S.C.section 360bbb-3(b)(1), unless the authorization is terminated  or revoked sooner.       Influenza A by PCR NEGATIVE NEGATIVE Final   Influenza B by PCR NEGATIVE NEGATIVE Final    Comment: (NOTE) The Xpert Xpress SARS-CoV-2/FLU/RSV plus assay is intended as an aid in the diagnosis of influenza from Nasopharyngeal swab specimens and should not be used as a sole basis for treatment. Nasal washings and aspirates are  unacceptable for Xpert Xpress SARS-CoV-2/FLU/RSV testing.  Fact Sheet for Patients: EntrepreneurPulse.com.au  Fact Sheet for Healthcare Providers: IncredibleEmployment.be  This test is not yet approved or cleared by the [place] FDA and has been authorized for detection and/or diagnosis of SARS-CoV-2 by FDA under an Emergency Use Authorization (EUA). This EUA will remain in effect (meaning this test can be used) for the duration of the COVID-19 declaration under Section 564(b)(1) of the Act, 21 U.S.C. section 360bbb-3(b)(1), unless the authorization is terminated or revoked.  Performed at [facility], [address], [place], [place] [address]      Labs: BNP (last 3 results) No results for input(s): BNP in the last 8760 hours. Basic Metabolic Panel: Recent Labs  Lab 04/19/21 1133 04/20/21 0528 04/20/21 2002 04/21/21 0550  NA 141  140 144  --  138  K 2.5*  2.4* 5.4*  --  4.4  CL 106  105 115*  --  110  CO2 23  23 21*  --  21*  GLUCOSE 93  94 79  --  83  BUN 23  23 15   --  13  CREATININE 1.79*  1.70* 1.22*  --  1.32*  CALCIUM 8.3*  8.2* 9.1  --  9.6  MG 0.4* 1.4*  --  2.2  PHOS 3.2 1.5* 4.6  --    Liver Function Tests: Recent Labs  Lab 04/19/21 1133 04/20/21 0528  ALBUMIN 3.7 3.5   No results for input(s): LIPASE, AMYLASE in the last 168 hours. No results for input(s): AMMONIA in the last 168 hours. CBC: Recent Labs  Lab 04/19/21 1133 04/20/21 0528 04/21/21 0550   WBC 8.3 5.7 4.7  NEUTROABS 6.3  --   --   HGB 10.6* 10.0* 10.7*  HCT 32.8* 33.2* 34.3*  MCV 90.4 96.5 93.0  PLT 181 161 192   Cardiac Enzymes: No results for input(s): CKTOTAL, CKMB, CKMBINDEX, TROPONINI in the last 168 hours. BNP: Invalid input(s): POCBNP CBG: No results for input(s): GLUCAP in the last 168 hours. D-Dimer No results for input(s): DDIMER in the last 72 hours. Hgb A1c No results for input(s): HGBA1C in the last 72 hours. Lipid Profile No results for input(s): CHOL, HDL, LDLCALC, TRIG, CHOLHDL, LDLDIRECT in the last 72 hours. Thyroid function studies Recent Labs    04/19/21 1134  TSH 0.389   Anemia work up Recent Labs    04/21/21 0550  FERRITIN 536*  TIBC 202*  IRON 77   Urinalysis    Component Value Date/Time   PROTEINUR trace 01/03/2018 1638   LEUKOCYTESUR Negative 01/03/2018 1638   Sepsis Labs Invalid input(s): PROCALCITONIN,  WBC,  LACTICIDVEN Microbiology Recent Results (from the past 240 hour(s))  Resp Panel by RT-PCR (Flu A&B, Covid) Nasopharyngeal Swab     Status: None   Collection Time: 04/19/21  2:39 PM   Specimen: Nasopharyngeal Swab; Nasopharyngeal(NP) swabs in vial transport medium  Result Value Ref Range Status   SARS Coronavirus 2 by RT PCR NEGATIVE NEGATIVE Final    Comment: (NOTE) SARS-CoV-2 target nucleic acids are NOT DETECTED.  The SARS-CoV-2 RNA is generally detectable in upper respiratory specimens during the acute phase of infection. The lowest concentration of SARS-CoV-2 viral copies this assay can detect is 138 copies/mL. A negative result does not preclude SARS-Cov-2 infection and should not be used as the sole basis for treatment or other patient management decisions. A negative result may occur with  improper specimen collection/handling, submission of specimen other than nasopharyngeal swab, presence of  viral mutation(s) within the areas targeted by this assay, and inadequate number of viral copies(<138  copies/mL). A negative result must be combined with clinical observations, patient history, and epidemiological information. The expected result is Negative.  Fact Sheet for Patients:  EntrepreneurPulse.com.au  Fact Sheet for Healthcare Providers:  IncredibleEmployment.be  This test is no t yet approved or cleared by the [place] FDA and  has been authorized for detection and/or diagnosis of SARS-CoV-2 by FDA under an Emergency Use Authorization (EUA). This EUA will remain  in effect (meaning this test can be used) for the duration of the COVID-19 declaration under Section 564(b)(1) of the Act, 21 U.S.C.section 360bbb-3(b)(1), unless the authorization is terminated  or revoked sooner.       Influenza A by PCR NEGATIVE NEGATIVE Final   Influenza B by PCR NEGATIVE NEGATIVE Final    Comment: (NOTE) The Xpert Xpress SARS-CoV-2/FLU/RSV plus assay is intended as an aid in the diagnosis of influenza from Nasopharyngeal swab specimens and should not be used as a sole basis for treatment. Nasal washings and aspirates are unacceptable for Xpert Xpress SARS-CoV-2/FLU/RSV testing.  Fact Sheet for Patients: EntrepreneurPulse.com.au  Fact Sheet for Healthcare Providers: IncredibleEmployment.be  This test is not yet approved or cleared by the [place] FDA and has been authorized for detection and/or diagnosis of SARS-CoV-2 by FDA under an Emergency Use Authorization (EUA). This EUA will remain in effect (meaning this test can be used) for the duration of the COVID-19 declaration under Section 564(b)(1) of the Act, 21 U.S.C. section 360bbb-3(b)(1), unless the authorization is terminated or revoked.  Performed at [facility], [address] [place], [place] [address]      Time coordinating discharge: 58mins  SIGNED:   Kathie Dike, MD  [facility] 04/21/2021, 8:45 PM   If 7PM-7AM, please  contact night-coverage [email or web address]

## 2021-04-21 NOTE — Care Management Important Message (Signed)
Important Message  Patient Details  Name: Joanna [name] MRN: [id number] Date of Birth: 07/03/1951   Medicare Important Message Given:  Yes     Tommy Medal 04/21/2021, 1:39 PM

## 2021-04-22 ENCOUNTER — Telehealth: Payer: Self-pay | Admitting: Family Medicine

## 2021-04-22 NOTE — Telephone Encounter (Signed)
Called and discussed meds with pt. She states she is doing much better. Does not have any concerns at this time. States they stopped her losartan and indapmide. She is aware to bring all meds to visit and transferred her to front to schedule a follow up in next 7 -14 days and to call back sooner if any problems.

## 2021-04-22 NOTE — Telephone Encounter (Signed)
Please advise. Thank you

## 2021-04-22 NOTE — Telephone Encounter (Signed)
Nurse's-patient recently discharged from the hospital.   Please call patient, let them know that we are aware that they were discharged from the hospital.  We need for patient to do standard hospital follow up visit.  This is to help keep them going in the right direction.  Please schedule them to follow-up with [place] within the next 7 to 14 days. Advise the patient to bring all of their medications with them to the visit.   Review meds with patient does patient understand meds?  Is patient having any immediate needs or questions currently?    Nurses please schedule them for a late morning appointment 1120 or 1140 based upon availability next week

## 2021-04-22 NOTE — Telephone Encounter (Signed)
Joanna [name] from [facility] received a referral from [facility] for home health and Joanna [name] wants to know if Dr. Wolfgang Phoenix will sign off on home health.that the patient will be receiving  Joanna [name] states that you can leave a voicemail  if you can not reach her

## 2021-04-24 NOTE — Telephone Encounter (Signed)
May have verbal order thank you

## 2021-04-25 DIAGNOSIS — E876 Hypokalemia: Secondary | ICD-10-CM | POA: Diagnosis not present

## 2021-04-25 DIAGNOSIS — G4700 Insomnia, unspecified: Secondary | ICD-10-CM | POA: Diagnosis not present

## 2021-04-25 DIAGNOSIS — E1122 Type 2 diabetes mellitus with diabetic chronic kidney disease: Secondary | ICD-10-CM | POA: Diagnosis not present

## 2021-04-25 DIAGNOSIS — G4730 Sleep apnea, unspecified: Secondary | ICD-10-CM | POA: Diagnosis not present

## 2021-04-25 DIAGNOSIS — Z79899 Other long term (current) drug therapy: Secondary | ICD-10-CM | POA: Diagnosis not present

## 2021-04-25 DIAGNOSIS — F32A Depression, unspecified: Secondary | ICD-10-CM | POA: Diagnosis not present

## 2021-04-25 DIAGNOSIS — E039 Hypothyroidism, unspecified: Secondary | ICD-10-CM | POA: Diagnosis not present

## 2021-04-25 DIAGNOSIS — F17210 Nicotine dependence, cigarettes, uncomplicated: Secondary | ICD-10-CM | POA: Diagnosis not present

## 2021-04-25 DIAGNOSIS — G8114 Spastic hemiplegia affecting left nondominant side: Secondary | ICD-10-CM | POA: Diagnosis not present

## 2021-04-25 DIAGNOSIS — Z9181 History of falling: Secondary | ICD-10-CM | POA: Diagnosis not present

## 2021-04-25 DIAGNOSIS — D631 Anemia in chronic kidney disease: Secondary | ICD-10-CM | POA: Diagnosis not present

## 2021-04-25 DIAGNOSIS — N184 Chronic kidney disease, stage 4 (severe): Secondary | ICD-10-CM | POA: Diagnosis not present

## 2021-04-25 DIAGNOSIS — E785 Hyperlipidemia, unspecified: Secondary | ICD-10-CM | POA: Diagnosis not present

## 2021-04-25 DIAGNOSIS — K219 Gastro-esophageal reflux disease without esophagitis: Secondary | ICD-10-CM | POA: Diagnosis not present

## 2021-04-25 DIAGNOSIS — I10 Essential (primary) hypertension: Secondary | ICD-10-CM | POA: Diagnosis not present

## 2021-04-25 DIAGNOSIS — I7090 Unspecified atherosclerosis: Secondary | ICD-10-CM | POA: Diagnosis not present

## 2021-04-25 DIAGNOSIS — K5790 Diverticulosis of intestine, part unspecified, without perforation or abscess without bleeding: Secondary | ICD-10-CM | POA: Diagnosis not present

## 2021-04-25 NOTE — Telephone Encounter (Signed)
Verbal order given to [facility]

## 2021-04-26 ENCOUNTER — Inpatient Hospital Stay (HOSPITAL_COMMUNITY): Payer: PPO

## 2021-04-26 ENCOUNTER — Telehealth: Payer: Self-pay | Admitting: Family Medicine

## 2021-04-26 NOTE — Telephone Encounter (Signed)
Please do so

## 2021-04-26 NOTE — Telephone Encounter (Signed)
[name] was given verbal order to proceed with plan of care per Dr Nicki Reaper.

## 2021-04-26 NOTE — Telephone Encounter (Signed)
[facility] called needing orders for plan of care order for 1 week 2 for walking therapy program.. [phone]

## 2021-04-27 ENCOUNTER — Encounter: Payer: Self-pay | Admitting: Family Medicine

## 2021-04-27 ENCOUNTER — Ambulatory Visit (INDEPENDENT_AMBULATORY_CARE_PROVIDER_SITE_OTHER): Payer: PPO | Admitting: Family Medicine

## 2021-04-27 ENCOUNTER — Other Ambulatory Visit (HOSPITAL_COMMUNITY): Payer: Self-pay | Admitting: Hematology

## 2021-04-27 ENCOUNTER — Other Ambulatory Visit: Payer: Self-pay

## 2021-04-27 VITALS — BP 116/78 | HR 99 | Temp 97.7°F | Ht 62.0 in | Wt 125.0 lb

## 2021-04-27 DIAGNOSIS — I10 Essential (primary) hypertension: Secondary | ICD-10-CM | POA: Diagnosis not present

## 2021-04-27 DIAGNOSIS — E876 Hypokalemia: Secondary | ICD-10-CM

## 2021-04-27 DIAGNOSIS — E119 Type 2 diabetes mellitus without complications: Secondary | ICD-10-CM

## 2021-04-27 MED ORDER — TOPIRAMATE 50 MG PO TABS: 50.0000 mg | ORAL_TABLET | Freq: Two times a day (BID) | ORAL | 0 refills | Status: DC

## 2021-04-27 MED ORDER — BLOOD GLUCOSE MONITOR KIT: PACK | 0 refills | Status: DC

## 2021-04-27 NOTE — Patient Instructions (Signed)
Stop topiramate 100 mg  Start topiramate 50 mg 1 taken twice daily   Please send [place] the name and phone number of the neurologist that you are scheduled to have the MRI with  We will see you in August  Please do your lab work

## 2021-04-27 NOTE — Progress Notes (Signed)
   Subjective:    Patient ID: Joanna [name], female    DOB: 07-Nov-1950, 70 y.o.   MRN: [id number]  HPIpt arrives for hospital follow up. 2  Itchy Rash on chest since being home from hospital. Using baby powder.   Right side of neck pain. Pt states she thinks it has something to do with the plate in her neck.   Patient recently in the hospital.  We did phone her after hospitalization She relates she is taking her medicines as directed She stopped the indapamide She will need a follow-up lab work to look at her potassium Her main problem she suffers with currently is a tremor in the left arm with weakness in the hand She states that hand specialist help set her up with the neurologist she does not know the name off the top of her head  Review of Systems     Objective:   Physical Exam Left arm weakness is noted with tremors and spasticity  General-in no acute distress Eyes-no discharge Lungs-respiratory rate normal, CTA CV-no murmurs,RRR Extremities skin warm dry no edema Neuro grossly normal Behavior normal, alert  Left arm weakness with tremor and spastic grip we did review medications.      Assessment & Plan:  Left arm tremor and spasticity patient and spouse to see neurology in the near future and have an MRI she has seen Dr. Jannifer Franklin before in the past I believe that this is a good idea for the patient to do so.  To try to figure out what is causing the degenerative changes  Chronic kidney disease follows up with specialist on a regular basis  Recent hypokalemia indapamide was stopped blood pressure under good control currently  Weight loss recheck A1c if it is under good control stop Trulicity.  Patient no longer needing to be on this We did review her medicines reduce topamax 50 mg bid Follow-up within 6 to 8 weeks sooner if any problems

## 2021-04-28 ENCOUNTER — Encounter (HOSPITAL_COMMUNITY): Payer: Self-pay | Admitting: Hematology

## 2021-04-28 ENCOUNTER — Telehealth: Payer: Self-pay | Admitting: Family Medicine

## 2021-04-28 DIAGNOSIS — E876 Hypokalemia: Secondary | ICD-10-CM | POA: Diagnosis not present

## 2021-04-28 LAB — HEMOGLOBIN A1C
Est. average glucose Bld gHb Est-mCnc: 114 mg/dL
Hgb A1c MFr Bld: 5.6 % (ref 4.8–5.6)

## 2021-04-28 LAB — CBC WITH DIFFERENTIAL/PLATELET
Basophils Absolute: 0.1 10*3/uL (ref 0.0–0.2)
Basos: 1 %
EOS (ABSOLUTE): 0.1 10*3/uL (ref 0.0–0.4)
Eos: 1 %
Hematocrit: 33.2 % — ABNORMAL LOW (ref 34.0–46.6)
Hemoglobin: 10.6 g/dL — ABNORMAL LOW (ref 11.1–15.9)
Immature Grans (Abs): 0 10*3/uL (ref 0.0–0.1)
Immature Granulocytes: 0 %
Lymphocytes Absolute: 1.9 10*3/uL (ref 0.7–3.1)
Lymphs: 32 %
MCH: 28.4 pg (ref 26.6–33.0)
MCHC: 31.9 g/dL (ref 31.5–35.7)
MCV: 89 fL (ref 79–97)
Monocytes Absolute: 0.3 10*3/uL (ref 0.1–0.9)
Monocytes: 6 %
Neutrophils Absolute: 3.6 10*3/uL (ref 1.4–7.0)
Neutrophils: 60 %
Platelets: 285 10*3/uL (ref 150–450)
RBC: 3.73 x10E6/uL — ABNORMAL LOW (ref 3.77–5.28)
RDW: 12.7 % (ref 11.7–15.4)
WBC: 6 10*3/uL (ref 3.4–10.8)

## 2021-04-28 LAB — BASIC METABOLIC PANEL
BUN/Creatinine Ratio: 12 (ref 12–28)
BUN: 22 mg/dL (ref 8–27)
CO2: 16 mmol/L — ABNORMAL LOW (ref 20–29)
Calcium: 11.1 mg/dL — ABNORMAL HIGH (ref 8.7–10.3)
Chloride: 106 mmol/L (ref 96–106)
Creatinine, Ser: 1.82 mg/dL — ABNORMAL HIGH (ref 0.57–1.00)
Glucose: 71 mg/dL (ref 65–99)
Potassium: 5.4 mmol/L — ABNORMAL HIGH (ref 3.5–5.2)
Sodium: 142 mmol/L (ref 134–144)
eGFR: 30 mL/min/{1.73_m2} — ABNORMAL LOW (ref >59)

## 2021-04-28 NOTE — Telephone Encounter (Signed)
May have verbal order for the weekly nurse

## 2021-04-28 NOTE — Telephone Encounter (Signed)
Cara from [facility] is calling to get orders for Nursing care once a week for 4 weeks. For medication management education.  States that you can leave a voicemail on her number if she does not answer.

## 2021-04-28 NOTE — Telephone Encounter (Signed)
Pt is requesting a call back from the nurse states that she has some information to give to Dr. Wolfgang Phoenix. Pt is suppose to see Dr. Clarice Pole

## 2021-04-28 NOTE — Telephone Encounter (Signed)
Left message to return call 

## 2021-04-28 NOTE — Telephone Encounter (Signed)
Dr Clarice Pole is the neurologist patient is seeing

## 2021-04-29 ENCOUNTER — Other Ambulatory Visit: Payer: Self-pay | Admitting: *Deleted

## 2021-04-29 MED ORDER — TOPIRAMATE 50 MG PO TABS: 50.0000 mg | ORAL_TABLET | Freq: Two times a day (BID) | ORAL | 0 refills | Status: DC

## 2021-04-29 NOTE — Telephone Encounter (Signed)
CARA returning Autumn phone call 04/29/2021 at 9:45pm

## 2021-04-29 NOTE — Telephone Encounter (Signed)
Left message to return call with [facility]

## 2021-04-30 DIAGNOSIS — G4733 Obstructive sleep apnea (adult) (pediatric): Secondary | ICD-10-CM | POA: Diagnosis not present

## 2021-05-02 NOTE — Telephone Encounter (Signed)
Verbal order given to [facility]anna [name] health

## 2021-05-03 ENCOUNTER — Ambulatory Visit (HOSPITAL_COMMUNITY): Payer: PPO | Admitting: Hematology and Oncology

## 2021-05-03 ENCOUNTER — Encounter (HOSPITAL_COMMUNITY): Payer: Self-pay

## 2021-05-03 DIAGNOSIS — F17210 Nicotine dependence, cigarettes, uncomplicated: Secondary | ICD-10-CM | POA: Diagnosis not present

## 2021-05-03 DIAGNOSIS — N184 Chronic kidney disease, stage 4 (severe): Secondary | ICD-10-CM | POA: Diagnosis not present

## 2021-05-03 DIAGNOSIS — Z9181 History of falling: Secondary | ICD-10-CM | POA: Diagnosis not present

## 2021-05-03 DIAGNOSIS — E1122 Type 2 diabetes mellitus with diabetic chronic kidney disease: Secondary | ICD-10-CM | POA: Diagnosis not present

## 2021-05-03 DIAGNOSIS — Z79899 Other long term (current) drug therapy: Secondary | ICD-10-CM | POA: Diagnosis not present

## 2021-05-03 DIAGNOSIS — I7090 Unspecified atherosclerosis: Secondary | ICD-10-CM | POA: Diagnosis not present

## 2021-05-03 DIAGNOSIS — E876 Hypokalemia: Secondary | ICD-10-CM | POA: Diagnosis not present

## 2021-05-03 DIAGNOSIS — F32A Depression, unspecified: Secondary | ICD-10-CM | POA: Diagnosis not present

## 2021-05-03 DIAGNOSIS — K5790 Diverticulosis of intestine, part unspecified, without perforation or abscess without bleeding: Secondary | ICD-10-CM | POA: Diagnosis not present

## 2021-05-03 DIAGNOSIS — E039 Hypothyroidism, unspecified: Secondary | ICD-10-CM | POA: Diagnosis not present

## 2021-05-03 DIAGNOSIS — G4730 Sleep apnea, unspecified: Secondary | ICD-10-CM | POA: Diagnosis not present

## 2021-05-03 DIAGNOSIS — D631 Anemia in chronic kidney disease: Secondary | ICD-10-CM | POA: Diagnosis not present

## 2021-05-03 DIAGNOSIS — I10 Essential (primary) hypertension: Secondary | ICD-10-CM | POA: Diagnosis not present

## 2021-05-03 DIAGNOSIS — K219 Gastro-esophageal reflux disease without esophagitis: Secondary | ICD-10-CM | POA: Diagnosis not present

## 2021-05-03 DIAGNOSIS — G4700 Insomnia, unspecified: Secondary | ICD-10-CM | POA: Diagnosis not present

## 2021-05-03 DIAGNOSIS — G8114 Spastic hemiplegia affecting left nondominant side: Secondary | ICD-10-CM | POA: Diagnosis not present

## 2021-05-03 DIAGNOSIS — E785 Hyperlipidemia, unspecified: Secondary | ICD-10-CM | POA: Diagnosis not present

## 2021-05-03 NOTE — Progress Notes (Signed)
Rescheduled

## 2021-05-09 NOTE — Progress Notes (Signed)
VIRTUAL VISIT VIA TELEPHONE NOTE [facility]  I connected with Joanna [name]  on 05/10/21  at  11:48 AM  by telephone and verified that I am speaking with the correct person using two identifiers.  Location: Patient: Home Provider: [facility]   I discussed the limitations, risks, security and privacy concerns of performing an evaluation and management service by telephone and the availability of in person appointments. I also discussed with the patient that there may be a patient responsible charge related to this service. The patient expressed understanding and agreed to proceed.  REASON FOR VISIT:  Follow-up for normocytic anemia  PRIOR THERAPY: None  CURRENT THERAPY: Intermittent IV iron (last Feraheme on 08/04/2020)  HISTORY OF PRESENT ILLNESS: Joanna [name] 70 y.o. 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  She was last seen by Dr. Delton Coombes on 10/21/2020.  At today's visit, she reports feeling well.  She reports that she was hospitalized earlier this month for presyncopal episode, as well as low magnesium, potassium, and AKI.  She is feeling better now.  No other changes in her baseline health status.  No current signs or symptoms of blood loss, she denies epistaxis, hematemesis, hematochezia, and melena.  She does admit to 10 to 15 pounds of weight loss in the past 6 months, reports that she has had a poor appetite due to stress.  She reports that her appetite has now come back and she has been maintaining a stable weight since she started eating her normal food again.  No other B symptoms such as fever, chills, night sweats.  She denies fatigue, chest pain, dyspnea exertion, palpitations.  She reports that she quit smoking 1 month ago.  She has 90% energy and 100% appetite. She endorses that she is maintaining a stable weight.    OBSERVATIONS/OBJECTIVE: Review of Systems   Constitutional:  Negative for chills, diaphoresis, fever, malaise/fatigue and weight loss.  Respiratory:  Negative for cough and shortness of breath.   Cardiovascular:  Negative for chest pain and palpitations.  Gastrointestinal:  Negative for abdominal pain, blood in stool, melena, nausea and vomiting.  Neurological:  Positive for tingling (left hand) and headaches. Negative for dizziness.    PHYSICAL EXAM (per limitations of virtual telephone visit): The patient is alert and oriented x 3, exhibiting adequate mentation, good mood, and ability to speak in full sentences and execute sound judgement.   ASSESSMENT & PLAN: 1.  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 - SPEP (12/10/2018) negative for M spike - Denies any bleeding per rectum or melena. - Last Feraheme on 08/04/2020. -Most recent labs (04/21/2021 and 04/27/2021): Hgb 10.6 with MCV 89, ferritin 536, iron saturation 38% with TIBC 202 - PLAN: Hemoglobin is at baseline.  No indication for IV iron at this time.  Will consider initiation of ESA if Hgb drops below 10.0.  RTC in 6 months with repeat labs and office visit.  2.  Smoking history: -She is a former smoker, smoked half pack per day for 50 years, quit in June 2022 -CT CAP on 11/07/2019 showed New 4 mm right lower lobe lung nodule is nonspecific but likely benign.  Noncontrast chest CT was recommended in 12 months.  Subjective thickening of the greater curvature of the stomach in the region of the fundus and body, exaggerated by under distention of the  stomach.  She had EGD in 2016 and stomach was normal.  She does not have any abdominal pain or bloating at this time.  She did not have any weight loss in the last 6 months. -CT chest without contrast on 10/19/2020 showed stable 4 mm right lower lobe solid pulmonary nodule considered benign.  No active pulmonary disease. - This patient meets criteria for low-dose CT lung  cancer screening (age 29-80 with a 20+ pack year history, quit < 15 years ago, no current signs or symptoms of lung cancer) - The shared decision making visit discussion included risks and benefits of screening, potential for follow-up, diagnostic testing for abnormal scans, potential for false positive tests, overdiagnosis, discussion about total radiation exposure - Patient stated willingness to undergo diagnostics and treatment as needed - Patient was counseled on smoking cessation to decrease the  risk of lung cancer, pulmonary disease, heart disease, and stroke - Patient has been referred to Lung Cancer Screening Nurse Coordinator for further scheduling of LDCT and for further resources regarding free nicotine replacement therapy and information about smoking cessation classes  - PLAN: Repeat LDCT in January 2023.   PLAN SUMMARY & DISPOSITION: - RTC in 6 months with repeat labs and office visit - LDCT for lung cancer screening in 6 months    I discussed the assessment and treatment plan with the patient. The patient was provided an opportunity to ask questions and all were answered. The patient agreed with the plan and demonstrated an understanding of the instructions.   The patient was advised to call back or seek an in-person evaluation if the symptoms worsen or if the condition fails to improve as anticipated.  I provided 13 minutes of non-face-to-face time during this encounter.  Harriett Rush, PA-C 05/10/2021 12:05 PM

## 2021-05-10 ENCOUNTER — Inpatient Hospital Stay (HOSPITAL_COMMUNITY): Payer: PPO | Attending: Physician Assistant | Admitting: Physician Assistant

## 2021-05-10 ENCOUNTER — Other Ambulatory Visit (HOSPITAL_COMMUNITY): Payer: Self-pay

## 2021-05-10 DIAGNOSIS — D631 Anemia in chronic kidney disease: Secondary | ICD-10-CM | POA: Diagnosis not present

## 2021-05-10 DIAGNOSIS — Z87891 Personal history of nicotine dependence: Secondary | ICD-10-CM

## 2021-05-10 DIAGNOSIS — Z122 Encounter for screening for malignant neoplasm of respiratory organs: Secondary | ICD-10-CM

## 2021-05-10 DIAGNOSIS — N183 Chronic kidney disease, stage 3 unspecified: Secondary | ICD-10-CM | POA: Diagnosis not present

## 2021-05-10 DIAGNOSIS — D509 Iron deficiency anemia, unspecified: Secondary | ICD-10-CM | POA: Diagnosis not present

## 2021-05-11 DIAGNOSIS — M79642 Pain in left hand: Secondary | ICD-10-CM | POA: Diagnosis not present

## 2021-05-11 DIAGNOSIS — M62838 Other muscle spasm: Secondary | ICD-10-CM | POA: Diagnosis not present

## 2021-05-13 DIAGNOSIS — G8114 Spastic hemiplegia affecting left nondominant side: Secondary | ICD-10-CM | POA: Diagnosis not present

## 2021-05-16 ENCOUNTER — Encounter (HOSPITAL_COMMUNITY): Payer: Self-pay | Admitting: Hematology

## 2021-05-16 NOTE — Telephone Encounter (Signed)
Dr Mickel Fuchs is with [facility] as a neurologist.  This is a good choice.  We will follow along.

## 2021-05-19 DIAGNOSIS — Z9181 History of falling: Secondary | ICD-10-CM | POA: Diagnosis not present

## 2021-05-19 DIAGNOSIS — E785 Hyperlipidemia, unspecified: Secondary | ICD-10-CM | POA: Diagnosis not present

## 2021-05-19 DIAGNOSIS — D631 Anemia in chronic kidney disease: Secondary | ICD-10-CM | POA: Diagnosis not present

## 2021-05-19 DIAGNOSIS — E039 Hypothyroidism, unspecified: Secondary | ICD-10-CM | POA: Diagnosis not present

## 2021-05-19 DIAGNOSIS — Z79899 Other long term (current) drug therapy: Secondary | ICD-10-CM | POA: Diagnosis not present

## 2021-05-19 DIAGNOSIS — K219 Gastro-esophageal reflux disease without esophagitis: Secondary | ICD-10-CM | POA: Diagnosis not present

## 2021-05-19 DIAGNOSIS — F32A Depression, unspecified: Secondary | ICD-10-CM | POA: Diagnosis not present

## 2021-05-19 DIAGNOSIS — G8114 Spastic hemiplegia affecting left nondominant side: Secondary | ICD-10-CM | POA: Diagnosis not present

## 2021-05-19 DIAGNOSIS — G4700 Insomnia, unspecified: Secondary | ICD-10-CM | POA: Diagnosis not present

## 2021-05-19 DIAGNOSIS — F17210 Nicotine dependence, cigarettes, uncomplicated: Secondary | ICD-10-CM | POA: Diagnosis not present

## 2021-05-19 DIAGNOSIS — I10 Essential (primary) hypertension: Secondary | ICD-10-CM | POA: Diagnosis not present

## 2021-05-19 DIAGNOSIS — G4730 Sleep apnea, unspecified: Secondary | ICD-10-CM | POA: Diagnosis not present

## 2021-05-19 DIAGNOSIS — N184 Chronic kidney disease, stage 4 (severe): Secondary | ICD-10-CM | POA: Diagnosis not present

## 2021-05-19 DIAGNOSIS — E876 Hypokalemia: Secondary | ICD-10-CM | POA: Diagnosis not present

## 2021-05-19 DIAGNOSIS — K5790 Diverticulosis of intestine, part unspecified, without perforation or abscess without bleeding: Secondary | ICD-10-CM | POA: Diagnosis not present

## 2021-05-19 DIAGNOSIS — E1122 Type 2 diabetes mellitus with diabetic chronic kidney disease: Secondary | ICD-10-CM | POA: Diagnosis not present

## 2021-05-19 DIAGNOSIS — I7090 Unspecified atherosclerosis: Secondary | ICD-10-CM | POA: Diagnosis not present

## 2021-05-20 ENCOUNTER — Other Ambulatory Visit: Payer: Self-pay

## 2021-05-20 ENCOUNTER — Ambulatory Visit (HOSPITAL_COMMUNITY)
Admission: RE | Admit: 2021-05-20 | Discharge: 2021-05-20 | Disposition: A | Discharge status: Home / Self Care | Payer: PPO | Source: Ambulatory Visit | Attending: Physician Assistant | Admitting: Physician Assistant

## 2021-05-20 DIAGNOSIS — Z122 Encounter for screening for malignant neoplasm of respiratory organs: Secondary | ICD-10-CM | POA: Insufficient documentation

## 2021-05-20 DIAGNOSIS — F17210 Nicotine dependence, cigarettes, uncomplicated: Secondary | ICD-10-CM | POA: Diagnosis not present

## 2021-05-20 DIAGNOSIS — Z87891 Personal history of nicotine dependence: Secondary | ICD-10-CM | POA: Diagnosis not present

## 2021-05-23 ENCOUNTER — Other Ambulatory Visit (HOSPITAL_COMMUNITY): Payer: Self-pay

## 2021-05-23 DIAGNOSIS — Z122 Encounter for screening for malignant neoplasm of respiratory organs: Secondary | ICD-10-CM

## 2021-05-23 DIAGNOSIS — Z87891 Personal history of nicotine dependence: Secondary | ICD-10-CM

## 2021-05-23 NOTE — Progress Notes (Signed)
Call report received for LDCT. Patient made aware of results and recommendations. Patient scheduled for F/U on 11/29/21 at 1400. Patient aware and agreeable to plan. Patient's PCP also made aware of results.

## 2021-05-26 DIAGNOSIS — N1832 Chronic kidney disease, stage 3b: Secondary | ICD-10-CM | POA: Diagnosis not present

## 2021-05-30 DIAGNOSIS — D631 Anemia in chronic kidney disease: Secondary | ICD-10-CM | POA: Diagnosis not present

## 2021-05-30 DIAGNOSIS — I129 Hypertensive chronic kidney disease with stage 1 through stage 4 chronic kidney disease, or unspecified chronic kidney disease: Secondary | ICD-10-CM | POA: Diagnosis not present

## 2021-05-30 DIAGNOSIS — N1832 Chronic kidney disease, stage 3b: Secondary | ICD-10-CM | POA: Diagnosis not present

## 2021-05-30 DIAGNOSIS — E1122 Type 2 diabetes mellitus with diabetic chronic kidney disease: Secondary | ICD-10-CM | POA: Diagnosis not present

## 2021-05-30 DIAGNOSIS — Z720 Tobacco use: Secondary | ICD-10-CM | POA: Diagnosis not present

## 2021-05-30 DIAGNOSIS — N2581 Secondary hyperparathyroidism of renal origin: Secondary | ICD-10-CM | POA: Diagnosis not present

## 2021-05-30 DIAGNOSIS — E785 Hyperlipidemia, unspecified: Secondary | ICD-10-CM | POA: Diagnosis not present

## 2021-05-30 DIAGNOSIS — R911 Solitary pulmonary nodule: Secondary | ICD-10-CM | POA: Diagnosis not present

## 2021-05-30 DIAGNOSIS — I2510 Atherosclerotic heart disease of native coronary artery without angina pectoris: Secondary | ICD-10-CM | POA: Diagnosis not present

## 2021-06-07 ENCOUNTER — Other Ambulatory Visit: Payer: Self-pay | Admitting: *Deleted

## 2021-06-07 DIAGNOSIS — E039 Hypothyroidism, unspecified: Secondary | ICD-10-CM

## 2021-06-07 MED ORDER — LEVOTHYROXINE SODIUM 88 MCG PO TABS: 88.0000 ug | ORAL_TABLET | Freq: Every day | ORAL | 0 refills | Status: DC

## 2021-06-13 ENCOUNTER — Other Ambulatory Visit: Payer: Self-pay

## 2021-06-13 ENCOUNTER — Ambulatory Visit (INDEPENDENT_AMBULATORY_CARE_PROVIDER_SITE_OTHER): Payer: PPO | Admitting: Family Medicine

## 2021-06-13 ENCOUNTER — Encounter: Payer: Self-pay | Admitting: Family Medicine

## 2021-06-13 VITALS — BP 134/76 | Temp 98.1°F | Wt 125.6 lb

## 2021-06-13 DIAGNOSIS — G959 Disease of spinal cord, unspecified: Secondary | ICD-10-CM | POA: Diagnosis not present

## 2021-06-13 DIAGNOSIS — I10 Essential (primary) hypertension: Secondary | ICD-10-CM | POA: Diagnosis not present

## 2021-06-13 DIAGNOSIS — N1832 Chronic kidney disease, stage 3b: Secondary | ICD-10-CM | POA: Diagnosis not present

## 2021-06-13 DIAGNOSIS — G8114 Spastic hemiplegia affecting left nondominant side: Secondary | ICD-10-CM | POA: Diagnosis not present

## 2021-06-13 DIAGNOSIS — R29898 Other symptoms and signs involving the musculoskeletal system: Secondary | ICD-10-CM | POA: Diagnosis not present

## 2021-06-13 MED ORDER — TIZANIDINE HCL 2 MG PO TABS: ORAL_TABLET | ORAL | 3 refills | Status: DC

## 2021-06-13 NOTE — Progress Notes (Signed)
   Subjective:    Patient ID: Joanna [name], female    DOB: [birth year], 70 y.o.   MRN: [id number]  HPI Pt here for follow up. Pt states blood pressure has been doing ok. Seen kidney doctor week before last. No symptoms with blood pressure. Kidney doctor took pt off Vit D and Amlodipine. Potassium is now 3 per day.   Patient has noticed how her left hand has become more contracted and now her left upper arm in lower arm constantly stay spastic and are unable to follow reasonable directions where her whole left arm is nonfunctional  She saw a specialist at [facility] did nerve conduction studies and all they told her was that she needed to follow-up with Dr. Jannifer Franklin She states that Dr. Jannifer Franklin is retiring and the new physician cannot see her until later October She relates some pain and discomfort with this but mainly its the disability  Review of Systems     Objective:   Physical Exam General-in no acute distress Eyes-no discharge Lungs-respiratory rate normal, CTA CV-no murmurs,RRR Extremities skin warm dry no edema Neuro grossly normal Behavior normal, alert  She has significant spastic tightness and immobility of the left arm she has contractures at the elbow wrist hand and evidence of increased muscle tension throughout the arm unable to lift the arm up in the arm extend the arm.  Unable to use her hand at all.  This is progressively worse compared to where it was.      Assessment & Plan:  1. Essential hypertension, benign Blood pressure good control continue current measures - MR Cervical Spine Wo Contrast - Vitamin D (25 hydroxy) - Basic Metabolic Panel (BMET) - Magnesium - Phosphorus - PTH, Intact and Calcium  2. Stage 3b chronic kidney disease ([place]) Check kidney function.  Nephrology reduced her potassium to just 3/day we will send a copy to nephrology - MR Cervical Spine Wo Contrast - Vitamin D (25 hydroxy) - Basic Metabolic Panel (BMET) - Magnesium -  Phosphorus - PTH, Intact and Calcium  3. Hypercalcemia Will recheck this also PTH more than likely related to her kidney dysfunction - MR Cervical Spine Wo Contrast - Vitamin D (25 hydroxy) - Basic Metabolic Panel (BMET) - Magnesium - Phosphorus - PTH, Intact and Calcium  4. Left hand weakness Significant left hand weakness progressive - MR Cervical Spine Wo Contrast - Vitamin D (25 hydroxy) - Basic Metabolic Panel (BMET) - Magnesium - Phosphorus - PTH, Intact and Calcium  5. 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 Cervical myelopathy ([place]) Will see specialist later in the fall will repeat MRI to see if there is increased trouble going on. Patient had previous neck surgery back in 2020 it never helped her overall issue - MR Cervical Spine Wo Contrast - Vitamin D (25 hydroxy) - Basic Metabolic Panel (BMET) - Magnesium - Phosphorus - PTH, Intact and Calcium  7. Disease of spinal cord [facility]) Please see above - MR Cervical Spine Wo Contrast  She will see neurology in late October she will follow-up with [place] in November

## 2021-06-14 LAB — PTH, INTACT AND CALCIUM: PTH: 68 pg/mL — ABNORMAL HIGH (ref 15–65)

## 2021-06-14 LAB — BASIC METABOLIC PANEL
BUN/Creatinine Ratio: 11 — ABNORMAL LOW (ref 12–28)
BUN: 14 mg/dL (ref 8–27)
CO2: 20 mmol/L (ref 20–29)
Calcium: 8.7 mg/dL (ref 8.7–10.3)
Chloride: 109 mmol/L — ABNORMAL HIGH (ref 96–106)
Creatinine, Ser: 1.22 mg/dL — ABNORMAL HIGH (ref 0.57–1.00)
Glucose: 73 mg/dL (ref 65–99)
Potassium: 4 mmol/L (ref 3.5–5.2)
Sodium: 146 mmol/L — ABNORMAL HIGH (ref 134–144)
eGFR: 48 mL/min/{1.73_m2} — ABNORMAL LOW (ref >59)

## 2021-06-14 LAB — PHOSPHORUS: Phosphorus: 3.2 mg/dL (ref 3.0–4.3)

## 2021-06-14 LAB — MAGNESIUM: Magnesium: 0.3 mg/dL — CL (ref 1.6–2.3)

## 2021-06-14 LAB — VITAMIN D 25 HYDROXY (VIT D DEFICIENCY, FRACTURES): Vit D, 25-Hydroxy: 28.5 ng/mL — ABNORMAL LOW (ref 30.0–100.0)

## 2021-06-24 ENCOUNTER — Encounter (HOSPITAL_COMMUNITY): Payer: Self-pay | Admitting: Emergency Medicine

## 2021-06-24 ENCOUNTER — Emergency Department (HOSPITAL_COMMUNITY): Payer: PPO

## 2021-06-24 ENCOUNTER — Inpatient Hospital Stay (HOSPITAL_COMMUNITY): Payer: PPO

## 2021-06-24 ENCOUNTER — Other Ambulatory Visit: Payer: Self-pay

## 2021-06-24 ENCOUNTER — Ambulatory Visit (HOSPITAL_COMMUNITY): Payer: PPO | Attending: Family Medicine

## 2021-06-24 ENCOUNTER — Inpatient Hospital Stay (HOSPITAL_COMMUNITY)
Admission: EM | Admit: 2021-06-24 | Discharge: 2021-06-28 | DRG: 682 | Disposition: A | Discharge status: Skilled Nursing Facility | Payer: PPO | Attending: Internal Medicine | Admitting: Internal Medicine

## 2021-06-24 DIAGNOSIS — Y92009 Unspecified place in unspecified non-institutional (private) residence as the place of occurrence of the external cause: Secondary | ICD-10-CM | POA: Diagnosis not present

## 2021-06-24 DIAGNOSIS — R6510 Systemic inflammatory response syndrome (SIRS) of non-infectious origin without acute organ dysfunction: Secondary | ICD-10-CM | POA: Insufficient documentation

## 2021-06-24 DIAGNOSIS — W19XXXA Unspecified fall, initial encounter: Secondary | ICD-10-CM | POA: Diagnosis not present

## 2021-06-24 DIAGNOSIS — R9431 Abnormal electrocardiogram [ECG] [EKG]: Secondary | ICD-10-CM | POA: Diagnosis present

## 2021-06-24 DIAGNOSIS — E860 Dehydration: Secondary | ICD-10-CM | POA: Diagnosis not present

## 2021-06-24 DIAGNOSIS — G4730 Sleep apnea, unspecified: Secondary | ICD-10-CM | POA: Diagnosis present

## 2021-06-24 DIAGNOSIS — R000 Tachycardia, unspecified: Secondary | ICD-10-CM | POA: Diagnosis not present

## 2021-06-24 DIAGNOSIS — Z888 Allergy status to other drugs, medicaments and biological substances status: Secondary | ICD-10-CM

## 2021-06-24 DIAGNOSIS — Z83438 Family history of other disorder of lipoprotein metabolism and other lipidemia: Secondary | ICD-10-CM | POA: Diagnosis not present

## 2021-06-24 DIAGNOSIS — E1122 Type 2 diabetes mellitus with diabetic chronic kidney disease: Secondary | ICD-10-CM | POA: Diagnosis present

## 2021-06-24 DIAGNOSIS — D631 Anemia in chronic kidney disease: Secondary | ICD-10-CM | POA: Diagnosis present

## 2021-06-24 DIAGNOSIS — K219 Gastro-esophageal reflux disease without esophagitis: Secondary | ICD-10-CM | POA: Diagnosis not present

## 2021-06-24 DIAGNOSIS — E785 Hyperlipidemia, unspecified: Secondary | ICD-10-CM | POA: Diagnosis present

## 2021-06-24 DIAGNOSIS — M6281 Muscle weakness (generalized): Secondary | ICD-10-CM | POA: Diagnosis not present

## 2021-06-24 DIAGNOSIS — Z79890 Hormone replacement therapy (postmenopausal): Secondary | ICD-10-CM | POA: Diagnosis not present

## 2021-06-24 DIAGNOSIS — R0902 Hypoxemia: Secondary | ICD-10-CM | POA: Diagnosis not present

## 2021-06-24 DIAGNOSIS — E876 Hypokalemia: Secondary | ICD-10-CM | POA: Diagnosis present

## 2021-06-24 DIAGNOSIS — N184 Chronic kidney disease, stage 4 (severe): Secondary | ICD-10-CM | POA: Diagnosis not present

## 2021-06-24 DIAGNOSIS — N390 Urinary tract infection, site not specified: Secondary | ICD-10-CM | POA: Diagnosis present

## 2021-06-24 DIAGNOSIS — N171 Acute kidney failure with acute cortical necrosis: Secondary | ICD-10-CM | POA: Diagnosis not present

## 2021-06-24 DIAGNOSIS — R8271 Bacteriuria: Secondary | ICD-10-CM | POA: Diagnosis present

## 2021-06-24 DIAGNOSIS — G928 Other toxic encephalopathy: Secondary | ICD-10-CM | POA: Diagnosis present

## 2021-06-24 DIAGNOSIS — Z91041 Radiographic dye allergy status: Secondary | ICD-10-CM

## 2021-06-24 DIAGNOSIS — E872 Acidosis: Secondary | ICD-10-CM | POA: Diagnosis not present

## 2021-06-24 DIAGNOSIS — Z833 Family history of diabetes mellitus: Secondary | ICD-10-CM

## 2021-06-24 DIAGNOSIS — Z8249 Family history of ischemic heart disease and other diseases of the circulatory system: Secondary | ICD-10-CM

## 2021-06-24 DIAGNOSIS — N179 Acute kidney failure, unspecified: Principal | ICD-10-CM | POA: Diagnosis present

## 2021-06-24 DIAGNOSIS — G8114 Spastic hemiplegia affecting left nondominant side: Secondary | ICD-10-CM | POA: Diagnosis not present

## 2021-06-24 DIAGNOSIS — G4700 Insomnia, unspecified: Secondary | ICD-10-CM | POA: Diagnosis not present

## 2021-06-24 DIAGNOSIS — I129 Hypertensive chronic kidney disease with stage 1 through stage 4 chronic kidney disease, or unspecified chronic kidney disease: Secondary | ICD-10-CM | POA: Diagnosis not present

## 2021-06-24 DIAGNOSIS — F17210 Nicotine dependence, cigarettes, uncomplicated: Secondary | ICD-10-CM | POA: Diagnosis present

## 2021-06-24 DIAGNOSIS — E039 Hypothyroidism, unspecified: Secondary | ICD-10-CM | POA: Diagnosis not present

## 2021-06-24 DIAGNOSIS — D72829 Elevated white blood cell count, unspecified: Secondary | ICD-10-CM

## 2021-06-24 DIAGNOSIS — Z741 Need for assistance with personal care: Secondary | ICD-10-CM | POA: Diagnosis not present

## 2021-06-24 DIAGNOSIS — R4182 Altered mental status, unspecified: Secondary | ICD-10-CM | POA: Diagnosis not present

## 2021-06-24 DIAGNOSIS — R2681 Unsteadiness on feet: Secondary | ICD-10-CM | POA: Diagnosis not present

## 2021-06-24 DIAGNOSIS — R231 Pallor: Secondary | ICD-10-CM | POA: Diagnosis not present

## 2021-06-24 DIAGNOSIS — A419 Sepsis, unspecified organism: Secondary | ICD-10-CM

## 2021-06-24 DIAGNOSIS — G9341 Metabolic encephalopathy: Secondary | ICD-10-CM | POA: Diagnosis not present

## 2021-06-24 DIAGNOSIS — E870 Hyperosmolality and hypernatremia: Secondary | ICD-10-CM | POA: Diagnosis not present

## 2021-06-24 DIAGNOSIS — Z20822 Contact with and (suspected) exposure to covid-19: Secondary | ICD-10-CM | POA: Diagnosis not present

## 2021-06-24 DIAGNOSIS — I10 Essential (primary) hypertension: Secondary | ICD-10-CM | POA: Diagnosis not present

## 2021-06-24 LAB — URINALYSIS, ROUTINE W REFLEX MICROSCOPIC
Glucose, UA: NEGATIVE mg/dL
Ketones, ur: 15 mg/dL — AB
Leukocytes,Ua: NEGATIVE
Nitrite: NEGATIVE
Protein, ur: 100 mg/dL — AB
Specific Gravity, Urine: >1.03 — ABNORMAL HIGH (ref 1.005–1.030)
pH: 5.5 (ref 5.0–8.0)

## 2021-06-24 LAB — COMPREHENSIVE METABOLIC PANEL
ALT: 42 U/L (ref 0–44)
AST: 115 U/L — ABNORMAL HIGH (ref 15–41)
Albumin: 3.8 g/dL (ref 3.5–5.0)
Alkaline Phosphatase: 72 U/L (ref 38–126)
Anion gap: 16 — ABNORMAL HIGH (ref 5–15)
BUN: 70 mg/dL — ABNORMAL HIGH (ref 8–23)
CO2: 20 mmol/L — ABNORMAL LOW (ref 22–32)
Calcium: 8.4 mg/dL — ABNORMAL LOW (ref 8.9–10.3)
Chloride: 112 mmol/L — ABNORMAL HIGH (ref 98–111)
Creatinine, Ser: 2.25 mg/dL — ABNORMAL HIGH (ref 0.44–1.00)
GFR, Estimated: 23 mL/min — ABNORMAL LOW (ref >60)
Glucose, Bld: 120 mg/dL — ABNORMAL HIGH (ref 70–99)
Potassium: 4 mmol/L (ref 3.5–5.1)
Sodium: 148 mmol/L — ABNORMAL HIGH (ref 135–145)
Total Bilirubin: 0.8 mg/dL (ref 0.3–1.2)
Total Protein: 7.2 g/dL (ref 6.5–8.1)

## 2021-06-24 LAB — CBC WITH DIFFERENTIAL/PLATELET
Abs Immature Granulocytes: 0.14 10*3/uL — ABNORMAL HIGH (ref 0.00–0.07)
Basophils Absolute: 0.1 10*3/uL (ref 0.0–0.1)
Basophils Relative: 0 %
Eosinophils Absolute: 0 10*3/uL (ref 0.0–0.5)
Eosinophils Relative: 0 %
HCT: 36.8 % (ref 36.0–46.0)
Hemoglobin: 11.6 g/dL — ABNORMAL LOW (ref 12.0–15.0)
Immature Granulocytes: 1 %
Lymphocytes Relative: 6 %
Lymphs Abs: 1 10*3/uL (ref 0.7–4.0)
MCH: 29.7 pg (ref 26.0–34.0)
MCHC: 31.5 g/dL (ref 30.0–36.0)
MCV: 94.4 fL (ref 80.0–100.0)
Monocytes Absolute: 0.8 10*3/uL (ref 0.1–1.0)
Monocytes Relative: 5 %
Neutro Abs: 15.7 10*3/uL — ABNORMAL HIGH (ref 1.7–7.7)
Neutrophils Relative %: 88 %
Platelets: 297 10*3/uL (ref 150–400)
RBC: 3.9 MIL/uL (ref 3.87–5.11)
RDW: 14.1 % (ref 11.5–15.5)
WBC: 17.7 10*3/uL — ABNORMAL HIGH (ref 4.0–10.5)
nRBC: 0 % (ref 0.0–0.2)

## 2021-06-24 LAB — LACTIC ACID, PLASMA
Lactic Acid, Venous: 2.2 mmol/L (ref 0.5–1.9)
Lactic Acid, Venous: 2.1 mmol/L (ref 0.5–1.9)

## 2021-06-24 LAB — URINALYSIS, MICROSCOPIC (REFLEX)

## 2021-06-24 LAB — RESP PANEL BY RT-PCR (FLU A&B, COVID) ARPGX2
Influenza A by PCR: NEGATIVE
Influenza B by PCR: NEGATIVE
SARS Coronavirus 2 by RT PCR: NEGATIVE

## 2021-06-24 MED ORDER — ENOXAPARIN SODIUM 30 MG/0.3ML IJ SOSY
30.0000 mg | PREFILLED_SYRINGE | INTRAMUSCULAR | Status: DC
  Administered 2021-06-25 – 2021-06-28 (×4): 30 mg via SUBCUTANEOUS
  Filled 2021-06-24 (×4): qty 0.3

## 2021-06-24 MED ORDER — SODIUM CHLORIDE 0.9 % IV SOLN: Freq: Once | INTRAVENOUS | Status: AC

## 2021-06-24 MED ORDER — SODIUM CHLORIDE 0.9 % IV BOLUS
1000.0000 mL | Freq: Once | INTRAVENOUS | Status: AC
  Administered 2021-06-24: 1000 mL via INTRAVENOUS

## 2021-06-24 MED ORDER — SODIUM CHLORIDE 0.9 % IV SOLN
1.0000 g | Freq: Once | INTRAVENOUS | Status: AC
  Administered 2021-06-24: 1 g via INTRAVENOUS
  Filled 2021-06-24: qty 10

## 2021-06-24 NOTE — ED Provider Notes (Signed)
[facility] EMERGENCY DEPARTMENT Provider Note   CSN: [id number] Arrival date & time: 06/24/21  1856     History Chief Complaint  Patient presents with   Altered Mental Status    Joanna [name] is a 70 y.o. female presenting with a fall and confusion.  Patient himself is awake and alert, denies any acute complaints.  She denies headache or abdominal pain or nausea.  She cannot provide any further history.  Her brother Joanna [name] provide supplemental history over the phone.  He reports the patient typically lives by her self.  She has some confusion at baseline but is not demented.  A family member went to visit her today and found her laying on the floor, and said that she had a strong smell of urine.  She does have a history of confusion and weakness with UTIs.  HPI     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 GIVENS   Bilateral headaches    Depression    Diabetes mellitus    Diverticulosis    Dyslipidemia    GERD (gastroesophageal reflux disease)    Hypertension    Insomnia    Microproteinuria    Renal insufficiency 2010   Right carpal tunnel syndrome 12/24/2017   Sleep apnea    Sleep apnea 12/25/2017   Abnormal sleep study February 2019 CPAP ordered   Thyroid disease    hypothyroid    Patient Active Problem List   Diagnosis Date Noted   Dizziness and giddiness 04/19/2021   Hypomagnesemia 04/19/2021   Hypokalemia 04/19/2021   Near syncope 04/19/2021   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 ([place]) 01/19/2021   Non-recurrent acute suppurative otitis media of left ear without spontaneous rupture of tympanic membrane 01/06/2021   Seasonal allergic rhinitis due to pollen 01/06/2021   Abnormal CT scan, stomach 02/06/2020   Coronary artery disease due to lipid rich plaque 11/19/2019   Aortic atherosclerosis ([place]) 11/19/2019   Constipation 10/06/2019   AVM (arteriovenous  malformation) of small bowel, acquired    IDA (iron deficiency anemia) 12/03/2018   Sleep apnea 12/25/2017   Right carpal tunnel syndrome 12/24/2017   Chronic anxiety 02/13/2017   Right rotator cuff tendinitis 02/11/2015   Iron deficiency 08/25/2014   Microcytic anemia 06/17/2014   Insomnia 05/04/2014   Hypothyroidism 06/05/2013   Renal insufficiency 06/05/2013   Essential hypertension, benign 06/05/2013   Stress fracture of right foot 02/27/2013   Chronic radicular lumbar pain 02/27/2013   Hyperlipidemia 01/16/2013   Type 2 diabetes mellitus with stage 4 chronic kidney disease, without long-term current use of insulin ([place]) 01/16/2013    Past Surgical History:  Procedure Laterality Date   ABDOMINAL HYSTERECTOMY  1984   BACK SURGERY     BILATERAL OOPHORECTOMY     BIOPSY  02/19/2020   Procedure: BIOPSY;  Surgeon: [name], MD;  Location: [facility];  Service: Endoscopy;;  gastric   CERVICAL DISC SURGERY  01/16/2019   COLONOSCOPY  01/29/12   Fields-2-3 hyperplastic polyps, mod internal hemorrhoids, diverticulosis throughout colon   COLONOSCOPY N/A 07/03/2014   Dr. Oneida Alar: Hyperplastic polyps   COLONOSCOPY N/A 06/06/2019   Dr. Oneida Alar: Diverticulosis, hemorrhoids, single polyp removed, pathology revealed lipoma.  Next colonoscopy 10 years   ESOPHAGOGASTRODUODENOSCOPY   01/29/2012   Fields-2-3cm sliding HH, fundic gland polyps, NEGATIVE H pylori   ESOPHAGOGASTRODUODENOSCOPY N/A 07/03/2014   Dr. Oneida Alar: gastritis and normal small bowel biopsies  ESOPHAGOGASTRODUODENOSCOPY N/A 02/19/2020   Fields: hypertrophic folds in the stomach. benign polyp removed   GIVENS CAPSULE STUDY N/A 10/26/2014   SMALL BOWEL AVMs   KNEE ARTHROSCOPY Right    POLYPECTOMY  06/06/2019   Procedure: POLYPECTOMY;  Surgeon: [name], MD;  Location: [facility];  Service: Endoscopy;;   TOTAL ABDOMINAL HYSTERECTOMY W/ BILATERAL SALPINGOOPHORECTOMY       OB History   No obstetric history on file.      Family History  Problem Relation Age of Onset   Cardiomyopathy Father    Heart failure Mother    Hyperlipidemia Mother    Stroke Brother    Kidney cancer Brother    Diabetes Paternal Uncle    Heart disease Paternal Grandmother    Colon cancer Neg Hx    Colon polyps Neg Hx     Social History   Tobacco Use   Smoking status: Every Day    Packs/day: 0.25    Years: 30.00    Pack years: 7.50    Types: Cigarettes    Last attempt to quit: 10/16/2010    Years since quitting: 10.6   Smokeless tobacco: Former    Quit date: 10/28/2010  Vaping Use   Vaping Use: Never used  Substance Use Topics   Alcohol use: Yes    Comment: socially, maybe 3 times a year    Drug use: No    Home Medications Prior to Admission medications   Medication Sig Start Date End Date Taking? Authorizing Provider  acetaminophen (TYLENOL) 500 MG tablet Take 1,000 mg by mouth every 8 (eight) hours as needed for moderate pain.    [provider]  albuterol (PROVENTIL HFA;VENTOLIN HFA) 108 (90 Base) MCG/ACT inhaler Inhale 2 puffs into the lungs every 4 (four) hours as needed for wheezing or shortness of breath. 12/25/18   [name], MD  allopurinol (ZYLOPRIM) 100 MG tablet Take 1 tablet (100 mg total) by mouth daily. 01/19/21   [name], MD  blood glucose meter kit and supplies KIT Dispense based on patient and insurance preference. Use up to four times daily as directed. (FOR ICD-9 250.00, 250.01). 04/27/21   [name], MD  colchicine 0.6 MG tablet TAKE 1 TABLET BY MOUTH TWICE DAILY AS NEEDED FOR GOUT 04/14/20   [name], MD  Cyanocobalamin (VITAMIN B-12 PO) Take 1 tablet by mouth daily.    [provider]  ferrous sulfate 325 (65 FE) MG tablet Take 325 mg by mouth daily with breakfast.    [provider]  fexofenadine (ALLEGRA) 180 MG tablet Take 180 mg by mouth daily.    [provider]  fluticasone (FLONASE) 50 MCG/ACT nasal spray PLACE 2 SPRAYS INTO THE  NOSE AS NEEDED. 01/06/21   Chalmers Guest, NP  folic acid (FOLVITE) 1 MG tablet TAKE 1 TABLET(1 MG) BY MOUTH DAILY 04/28/21   [name], MD  FREESTYLE LITE test strip  05/03/21   [provider]  Lancets (FREESTYLE) lancets 1 each daily. 05/03/21   [provider]  levothyroxine (SYNTHROID) 88 MCG tablet Take 1 tablet (88 mcg total) by mouth daily before breakfast. 06/07/21   [name], MD  Magnesium 250 MG TABS Take 250 mg by mouth daily.     [provider]  omeprazole (PRILOSEC) 40 MG capsule Take 1 capsule (40 mg total) by mouth daily. 01/19/21   [name], MD  OVER THE COUNTER MEDICATION Vit d 3 2,000 iu  [provider]  potassium chloride (KLOR-CON) 10 MEQ tablet Take 3 tablets po each morning and 3 tablet po each evening 01/21/21   [name], MD  rosuvastatin (CRESTOR) 20 MG tablet Take 1 tablet by mouth daily (Stop Pravastatin) 01/19/21   [name], [name], MD  Simethicone (GAS-X PO) Take 1 capsule by mouth as needed (gas/bloating.).     [provider]  tiZANidine (ZANAFLEX) 2 MG tablet TAKE 1 TABLET BY MOUTH THREE TIMES DAILY as directed 06/13/21   [name], MD  topiramate (TOPAMAX) 50 MG tablet Take 1 tablet (50 mg total) by mouth 2 (two) times daily. 04/29/21   [name], MD  traZODone (DESYREL) 50 MG tablet TAKE 1/2 TO 1 TABLET BY MOUTH EVERY NIGHT FOR SLEEP 01/19/21   [name], MD    Allergies    Ivp dye [iodinated diagnostic agents] and Zoloft [sertraline hcl]  Review of Systems   Review of Systems  Unable to perform ROS: Mental status change (level 5 caveat)   Physical Exam Updated Vital Signs BP (!) 146/105 (BP Location: Right Wrist)   Pulse 94   Temp 99 F (37.2 C) (Oral)   Resp (!) 22   Ht '5\' 2"'  (1.575 m)   Wt 55.2 kg   SpO2 98%   BMI 22.26 kg/m   Physical Exam Constitutional:      General: She is not in acute distress. HENT:     Head: Normocephalic and atraumatic.  Eyes:      Conjunctiva/sclera: Conjunctivae normal.     Pupils: Pupils are equal, round, and reactive to light.  Cardiovascular:     Rate and Rhythm: Normal rate and regular rhythm.  Pulmonary:     Effort: Pulmonary effort is normal. No respiratory distress.  Abdominal:     General: There is no distension.     Tenderness: There is no abdominal tenderness.  Skin:    General: Skin is warm and dry.  Neurological:     General: No focal deficit present.     Mental Status: She is alert. Mental status is at baseline.    ED Results / Procedures / Treatments   Labs (all labs ordered are listed, but only abnormal results are displayed) Labs Reviewed  COMPREHENSIVE METABOLIC PANEL  URINALYSIS, ROUTINE W REFLEX MICROSCOPIC  CBC WITH DIFFERENTIAL/PLATELET    EKG None  Radiology No results found.  Procedures .Critical Care Performed by: Wyvonnia Dusky, MD Authorized by: Wyvonnia Dusky, MD   Critical care provider statement:    Critical care time (minutes):  45   Critical care was necessary to treat or prevent imminent or life-threatening deterioration of the following conditions:  Sepsis   Critical care was time spent personally by me on the following activities:  Discussions with consultants, evaluation of patient's response to treatment, examination of patient, ordering and performing treatments and interventions, ordering and review of laboratory studies, ordering and review of radiographic studies, pulse oximetry, re-evaluation of patient's condition, obtaining history from patient or surrogate and review of old charts   Medications Ordered in ED Medications - No data to display  ED Course  I have reviewed the triage vital signs and the nursing notes.  Pertinent labs & imaging results that were available during my care of the patient were reviewed by me and considered in my medical decision making (see chart for details).  This patient presents to the Emergency Department with  complaint of altered mental status.  This involves  an extensive number of treatment options, and is a complaint that carries with it a high risk of complications and morbidity.  The differential diagnosis includes hypoglycemia vs metabolic encephalopathy vs infection (including cystitis)  vs polypharmacy vs other  Suspected likely urinary source per his history and  strong smell of malodorous urine  I ordered, reviewed, and interpreted labs, including elevated lactate, elevated white blood cell count.  Also was doubling of creatinine suggestive of dehydration and AKI.  UA is pending with in and out catheter I ordered medication IV Rocephin for suspected UTI and infection coverage.  IV fluids ordered for hydration. I ordered imaging studies which included x-ray of the chest I independently visualized and interpreted imaging which showed no focal infiltrate and the monitor tracing which showed sinus rhythm with borderline tachycardia. Additional history was obtained from patient's brother by phone. I personally reviewed the patients ECG which showed sinus rhythm with no acute ischemic findings   Clinical Course as of 06/25/21 0055  Fri Jun 24, 2021  2101 Lactic Acid, Venous(!!): 2.2 [MT]  2101 Creatinine(!): 2.25 [MT]  2101 WBC(!): 17.7 [MT]  2204 Admitted to hospitalist [MT]    Clinical Course User Index [MT] Krew Hortman, Carola Rhine, MD    Final Clinical Impression(s) / ED Diagnoses Final diagnoses:  None    Rx / DC Orders ED Discharge Orders     None        Wyvonnia Dusky, MD 06/25/21 [phone]

## 2021-06-24 NOTE — ED Notes (Signed)
Date and time results received: 06/24/21 2253   Test: Lactic Critical Value: 2.1  Name of Provider Notified: Josephine Cables, DO

## 2021-06-24 NOTE — ED Triage Notes (Signed)
Pt brought in by [name] after family called stating pt had a fall. Per EMS, pt denies injury. Family states pt has been more altered over last few days and that she had strong smelling urine. Blood glucose was 138 per EMS.

## 2021-06-24 NOTE — H&P (Signed)
History and Physical  Joanna [name] VEL:[id number] DOB: 1951-06-30 DOA: 06/24/2021  Referring physician: Wyvonnia Dusky, MD PCP: [name], MD  Patient coming from: Home  Chief Complaint: Altered mental status  HPI: Joanna [name] is a 70 y.o. female with medical history significant for hypertension, type 2 diabetes mellitus, CKD 4, idiopathic left upper extremity spastic hemiplegia, hypothyroidism and tobacco abuse who presents to the emergency department via EMS due to fall and confusion.  Patient was awake and alert, though she was only oriented to self.  She thought she was at home and she denies any history of fall.  Rest of the history was obtained from the ED physician and ED medical record.  Per report, patient lives alone, she is confused at baseline but without history of dementia.  Family member went to visit her today and found on the floor with a strong odor of urine.  She was presumed to have UTI and was sent to the ED for further evaluation and management.  ED Course:  In the emergency department, she was tachypneic, BP was 152/107 and other vital signs are within normal range.  Work-up in the ED showed leukocytosis, hyponatremia, BUN/creatinine 17/2.25 (creatinine ranged within 1.2-1.8 within last 2 months), lactic acid 2.2 > 2.1, AST 115.  Urinalysis was positive for many bacteria.  Influenza A, B, SARS coronavirus 2 was negative. CT of head without contrast showed no acute intracranial pathology Chest x-ray showed no active cardiopulmonary disease She was empirically started on IV ceftriaxone due to presumed UTI, IV hydration was provided.  Hospitalist was asked to admit patient for further evaluation and management.  Review of Systems: This cannot be obtained at this time due to patient's altered mental status   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 [facility]   Bilateral headaches    Depression     Diabetes mellitus    Diverticulosis    Dyslipidemia    GERD (gastroesophageal reflux disease)    Hypertension    Insomnia    Microproteinuria    Renal insufficiency 2010   Right carpal tunnel syndrome 12/24/2017   Sleep apnea    Sleep apnea 12/25/2017   Abnormal sleep study February 2019 CPAP ordered   Thyroid disease    hypothyroid   Past Surgical History:  Procedure Laterality Date   ABDOMINAL HYSTERECTOMY  1984   BACK SURGERY     BILATERAL OOPHORECTOMY     BIOPSY  02/19/2020   Procedure: BIOPSY;  Surgeon: [name], MD;  Location: [facility];  Service: Endoscopy;;  gastric   CERVICAL DISC SURGERY  01/16/2019   COLONOSCOPY  01/29/12   Fields-2-3 hyperplastic polyps, mod internal hemorrhoids, diverticulosis throughout colon   COLONOSCOPY N/A 07/03/2014   Dr. Oneida Alar: Hyperplastic polyps   COLONOSCOPY N/A 06/06/2019   Dr. Oneida Alar: Diverticulosis, hemorrhoids, single polyp removed, pathology revealed lipoma.  Next colonoscopy 10 years   ESOPHAGOGASTRODUODENOSCOPY   01/29/2012   Fields-2-3cm sliding HH, fundic gland polyps, NEGATIVE H pylori   ESOPHAGOGASTRODUODENOSCOPY N/A 07/03/2014   Dr. Oneida Alar: gastritis and normal small bowel biopsies   ESOPHAGOGASTRODUODENOSCOPY N/A 02/19/2020   Fields: hypertrophic folds in the stomach. benign polyp removed   [facility] CAPSULE STUDY N/A 10/26/2014   SMALL BOWEL AVMs   KNEE ARTHROSCOPY Right    POLYPECTOMY  06/06/2019   Procedure: POLYPECTOMY;  Surgeon: [name], MD;  Location: [facility];  Service: Endoscopy;;   TOTAL  ABDOMINAL HYSTERECTOMY W/ BILATERAL SALPINGOOPHORECTOMY      Social History:  reports that she has been smoking cigarettes. She has a 7.50 pack-year smoking history. She quit smokeless tobacco use about 10 years ago. She reports current alcohol use. She reports that she does not use drugs.   Allergies  Allergen Reactions   Ivp Dye [Iodinated Diagnostic Agents]    Zoloft [Sertraline Hcl]     Withdrawn,bad dreams     Family History  Problem Relation Age of Onset   Cardiomyopathy Father    Heart failure Mother    Hyperlipidemia Mother    Stroke Brother    Kidney cancer Brother    Diabetes Paternal Uncle    Heart disease Paternal Grandmother    Colon cancer Neg Hx    Colon polyps Neg Hx      Prior to Admission medications   Medication Sig Start Date End Date Taking? Authorizing Provider  acetaminophen (TYLENOL) 500 MG tablet Take 1,000 mg by mouth every 8 (eight) hours as needed for moderate pain.    [provider]  albuterol (PROVENTIL HFA;VENTOLIN HFA) 108 (90 Base) MCG/ACT inhaler Inhale 2 puffs into the lungs every 4 (four) hours as needed for wheezing or shortness of breath. 12/25/18   [name], MD  allopurinol (ZYLOPRIM) 100 MG tablet Take 1 tablet (100 mg total) by mouth daily. 01/19/21   [name], MD  blood glucose meter kit and supplies KIT Dispense based on patient and insurance preference. Use up to four times daily as directed. (FOR ICD-9 250.00, 250.01). 04/27/21   [name], MD  colchicine 0.6 MG tablet TAKE 1 TABLET BY MOUTH TWICE DAILY AS NEEDED FOR GOUT 04/14/20   [name], MD  Cyanocobalamin (VITAMIN B-12 PO) Take 1 tablet by mouth daily.    [provider]  ferrous sulfate 325 (65 FE) MG tablet Take 325 mg by mouth daily with breakfast.    [provider]  fexofenadine (ALLEGRA) 180 MG tablet Take 180 mg by mouth daily.    [provider]  fluticasone (FLONASE) 50 MCG/ACT nasal spray PLACE 2 SPRAYS INTO THE NOSE AS NEEDED. 01/06/21   Chalmers Guest, NP  folic acid (FOLVITE) 1 MG tablet TAKE 1 TABLET(1 MG) BY MOUTH DAILY 04/28/21   [name], MD  FREESTYLE LITE test strip  05/03/21   [provider]  Lancets (FREESTYLE) lancets 1 each daily. 05/03/21   [provider]  levothyroxine (SYNTHROID) 88 MCG tablet Take 1 tablet (88 mcg total) by mouth daily before breakfast. 06/07/21   [name], MD   Magnesium 250 MG TABS Take 250 mg by mouth daily.     [provider]  omeprazole (PRILOSEC) 40 MG capsule Take 1 capsule (40 mg total) by mouth daily. 01/19/21   [name], MD  OVER THE COUNTER MEDICATION Vit d 3 2,000 iu    [provider]  potassium chloride (KLOR-CON) 10 MEQ tablet Take 3 tablets po each morning and 3 tablet po each evening 01/21/21   [name], MD  rosuvastatin (CRESTOR) 20 MG tablet Take 1 tablet by mouth daily (Stop Pravastatin) 01/19/21   [name], [name], MD  Simethicone (GAS-X PO) Take 1 capsule by mouth as needed (gas/bloating.).     [provider]  tiZANidine (ZANAFLEX) 2 MG tablet TAKE 1 TABLET BY MOUTH THREE TIMES DAILY as directed 06/13/21   [name], MD  topiramate (TOPAMAX) 50 MG tablet Take  1 tablet (50 mg total) by mouth 2 (two) times daily. 04/29/21   [name], MD  traZODone (DESYREL) 50 MG tablet TAKE 1/2 TO 1 TABLET BY MOUTH EVERY NIGHT FOR SLEEP 01/19/21   [name], MD    Physical Exam: BP (!) 158/109   Pulse 96   Temp 99 F (37.2 C) (Oral)   Resp 17   Ht _0  (1.575 m)   Wt 55.2 kg   SpO2 99%   BMI 22.26 kg/m   General: 70 y.o. year-old female well developed well nourished in no acute distress.  Alert and oriented to self only. HEENT: Dry mucous membrane.  NCAT, EOMI Neck: Supple, trachea medial Cardiovascular: Regular rate and rhythm with no rubs or gallops.  No thyromegaly or JVD noted.  No lower extremity edema. 2/4 pulses in all 4 extremities. Respiratory: Tachypnea.  Clear to auscultation with no wheezes or rales.  Abdomen: Soft, nontender nondistended with normal bowel sounds x4 quadrants. Muskuloskeletal: Contracted left upper extremity.  No cyanosis, clubbing or edema noted bilaterally Neuro:  sensation, reflexes intact.  No focal neurologic deficit. Skin: No ulcerative lesions noted or rashes Psychiatry: Mood is appropriate for condition and setting          Labs on Admission:   Basic Metabolic Panel: Recent Labs  Lab 06/24/21 1925  NA 148*  K 4.0  CL 112*  CO2 20*  GLUCOSE 120*  BUN 70*  CREATININE 2.25*  CALCIUM 8.4*   Liver Function Tests: Recent Labs  Lab 06/24/21 1925  AST 115*  ALT 42  ALKPHOS 72  BILITOT 0.8  PROT 7.2  ALBUMIN 3.8   No results for input(s): LIPASE, AMYLASE in the last 168 hours. No results for input(s): AMMONIA in the last 168 hours. CBC: Recent Labs  Lab 06/24/21 1925  WBC 17.7*  NEUTROABS 15.7*  HGB 11.6*  HCT 36.8  MCV 94.4  PLT 297   Cardiac Enzymes: No results for input(s): CKTOTAL, CKMB, CKMBINDEX, TROPONINI in the last 168 hours.  BNP (last 3 results) No results for input(s): BNP in the last 8760 hours.  ProBNP (last 3 results) No results for input(s): PROBNP in the last 8760 hours.  CBG: No results for input(s): GLUCAP in the last 168 hours.  Radiological Exams on Admission: DG Chest 2 View  Result Date: 06/24/2021 CLINICAL DATA:  Altered mental status EXAM: CHEST - 2 VIEW COMPARISON:  08/13/2017 FINDINGS: Lungs are well expanded, symmetric, and clear. No pneumothorax or pleural effusion. Cardiac size within normal limits. Pulmonary vascularity is normal. Osseous structures are age-appropriate. No acute bone abnormality. IMPRESSION: No active cardiopulmonary disease. Electronically Signed   By: Fidela Salisbury M.D.   On: 06/24/2021 20:54   CT HEAD WO CONTRAST (5MM)  Result Date: 06/25/2021 CLINICAL DATA:  Altered mental status. EXAM: CT HEAD WITHOUT CONTRAST TECHNIQUE: Contiguous axial images were obtained from the base of the skull through the vertex without intravenous contrast. COMPARISON:  Head CT dated 04/19/2021. FINDINGS: Brain: Mild age-related atrophy and chronic microvascular ischemic changes. There is no acute intracranial hemorrhage. No mass effect or midline shift. No extra-axial fluid collection. Vascular: No hyperdense vessel or unexpected calcification. Skull: Normal. Negative for  fracture or focal lesion. Sinuses/Orbits: No acute finding. Other: There is apparent diffuse scalp edema. IMPRESSION: 1. No acute intracranial pathology. 2. Mild age-related atrophy and chronic microvascular ischemic changes. Electronically Signed   By: Anner Crete M.D.   On: 06/25/2021 00:52    EKG: I independently  viewed the EKG done and my findings are as followed: Sinus tachycardia at rate of 106 bpm with QTc (529 ms)  Assessment/Plan Present on Admission:  UTI (urinary tract infection)  Principal Problem:   UTI (urinary tract infection) Active Problems:   Dehydration   AKI (acute kidney injury) ([place])   Leukocytosis   Fall at home, initial encounter   Hypernatremia   Sepsis ([place])  Altered mental status possibly secondary to sepsis due to presumed UTI POA CT of head without contrast showed no acute intracranial Pathology Patient met sepsis criteria on admission due to tachypnea, leukocytosis, source of infection (UTI) and positive lactic acidosis Patient was presumed to have UTI and was started on ceftriaxone, we shall continue same at this time with plan to de-escalate/discontinue based on urine culture, blood culture or procalcitonin  Acute dehydration Continue IV hydration  Lactic acidosis possibly secondary to multifactorial Lactic acid : 2.2 > 2.1, continue to trend lactic acid  Acute kidney injury on CKD 4 BUN/creatinine 17/2.25 (creatinine ranged within 1.2-1.8 within last 2 months) Renally adjust medications, avoid nephrotoxic agents/dehydration/hypotension  Leukocytosis possibly reactive versus due to UTI Fall at home  Prolonged QTc( 529) Avoid QT prolonging drugs Magnesium level will be checked  Hypernatremia Na 148, Continue gentle hydration Continue to monitor sodium with serial BMPs  Spastic hemiplegia of the left upper extremity (chronic) Continue fall precaution and neurochecks Continue PT/OT eval and treat  T2DM A1c about 5 months ago was  5.8 Continue carbohydrate modified diet  Essential hypertension No antihypertensive medication listed on patient's med rec We shall await updated med rec.  Hypothyroidism Continue Synthroid  Anemia of chronic disease Stable, hemoglobin within baseline range Continue folate and ferrous sulfate  Hyperlipidemia Continue Crestor  GERD Continue Protonix  DVT prophylaxis: Lovenox  Code Status: Full code  Family Communication: None at bedside  Disposition Plan:  Patient is from:                        home Anticipated DC to:                   SNF or family members home Anticipated DC date:               2-3 days Anticipated DC barriers:          Patient requires inpatient management due to presumed UTI being treated with IV antibiotics.  Consults called: None  Admission status: Inpatient    Bernadette Hoit MD [facility]  06/25/2021, 3:35 AM

## 2021-06-25 ENCOUNTER — Encounter (HOSPITAL_COMMUNITY): Payer: Self-pay | Admitting: Internal Medicine

## 2021-06-25 DIAGNOSIS — E860 Dehydration: Secondary | ICD-10-CM | POA: Insufficient documentation

## 2021-06-25 DIAGNOSIS — W19XXXA Unspecified fall, initial encounter: Secondary | ICD-10-CM

## 2021-06-25 DIAGNOSIS — A419 Sepsis, unspecified organism: Secondary | ICD-10-CM

## 2021-06-25 DIAGNOSIS — N179 Acute kidney failure, unspecified: Secondary | ICD-10-CM | POA: Insufficient documentation

## 2021-06-25 DIAGNOSIS — R6510 Systemic inflammatory response syndrome (SIRS) of non-infectious origin without acute organ dysfunction: Secondary | ICD-10-CM | POA: Insufficient documentation

## 2021-06-25 DIAGNOSIS — D72829 Elevated white blood cell count, unspecified: Secondary | ICD-10-CM

## 2021-06-25 DIAGNOSIS — E870 Hyperosmolality and hypernatremia: Secondary | ICD-10-CM

## 2021-06-25 LAB — BASIC METABOLIC PANEL
Anion gap: 13 (ref 5–15)
BUN: 61 mg/dL — ABNORMAL HIGH (ref 8–23)
CO2: 17 mmol/L — ABNORMAL LOW (ref 22–32)
Calcium: 7.6 mg/dL — ABNORMAL LOW (ref 8.9–10.3)
Chloride: 118 mmol/L — ABNORMAL HIGH (ref 98–111)
Creatinine, Ser: 1.67 mg/dL — ABNORMAL HIGH (ref 0.44–1.00)
GFR, Estimated: 33 mL/min — ABNORMAL LOW (ref >60)
Glucose, Bld: 89 mg/dL (ref 70–99)
Potassium: 3.2 mmol/L — ABNORMAL LOW (ref 3.5–5.1)
Sodium: 148 mmol/L — ABNORMAL HIGH (ref 135–145)

## 2021-06-25 LAB — GLUCOSE, CAPILLARY: Glucose-Capillary: 145 mg/dL — ABNORMAL HIGH (ref 70–99)

## 2021-06-25 LAB — PHOSPHORUS: Phosphorus: 3.1 mg/dL (ref 2.5–4.6)

## 2021-06-25 LAB — COMPREHENSIVE METABOLIC PANEL
ALT: 36 U/L (ref 0–44)
AST: 90 U/L — ABNORMAL HIGH (ref 15–41)
Albumin: 3.1 g/dL — ABNORMAL LOW (ref 3.5–5.0)
Alkaline Phosphatase: 59 U/L (ref 38–126)
Anion gap: 17 — ABNORMAL HIGH (ref 5–15)
BUN: 64 mg/dL — ABNORMAL HIGH (ref 8–23)
CO2: 15 mmol/L — ABNORMAL LOW (ref 22–32)
Calcium: 7.7 mg/dL — ABNORMAL LOW (ref 8.9–10.3)
Chloride: 117 mmol/L — ABNORMAL HIGH (ref 98–111)
Creatinine, Ser: 1.74 mg/dL — ABNORMAL HIGH (ref 0.44–1.00)
GFR, Estimated: 31 mL/min — ABNORMAL LOW (ref >60)
Glucose, Bld: 95 mg/dL (ref 70–99)
Potassium: 3.2 mmol/L — ABNORMAL LOW (ref 3.5–5.1)
Sodium: 149 mmol/L — ABNORMAL HIGH (ref 135–145)
Total Bilirubin: 0.7 mg/dL (ref 0.3–1.2)
Total Protein: 5.8 g/dL — ABNORMAL LOW (ref 6.5–8.1)

## 2021-06-25 LAB — CBC
HCT: 30.6 % — ABNORMAL LOW (ref 36.0–46.0)
Hemoglobin: 9.6 g/dL — ABNORMAL LOW (ref 12.0–15.0)
MCH: 29.4 pg (ref 26.0–34.0)
MCHC: 31.4 g/dL (ref 30.0–36.0)
MCV: 93.9 fL (ref 80.0–100.0)
Platelets: 260 10*3/uL (ref 150–400)
RBC: 3.26 MIL/uL — ABNORMAL LOW (ref 3.87–5.11)
RDW: 14.1 % (ref 11.5–15.5)
WBC: 16.8 10*3/uL — ABNORMAL HIGH (ref 4.0–10.5)
nRBC: 0 % (ref 0.0–0.2)

## 2021-06-25 LAB — PROTIME-INR
INR: 1.3 — ABNORMAL HIGH (ref 0.8–1.2)
Prothrombin Time: 16.1 seconds — ABNORMAL HIGH (ref 11.4–15.2)

## 2021-06-25 LAB — LACTIC ACID, PLASMA
Lactic Acid, Venous: 1.2 mmol/L (ref 0.5–1.9)
Lactic Acid, Venous: 1.5 mmol/L (ref 0.5–1.9)

## 2021-06-25 LAB — HIV ANTIBODY (ROUTINE TESTING W REFLEX): HIV Screen 4th Generation wRfx: NONREACTIVE

## 2021-06-25 LAB — APTT: aPTT: 26 seconds (ref 24–36)

## 2021-06-25 LAB — MAGNESIUM: Magnesium: 0.8 mg/dL — CL (ref 1.7–2.4)

## 2021-06-25 LAB — PROCALCITONIN: Procalcitonin: 0.66 ng/mL

## 2021-06-25 MED ORDER — ALBUTEROL SULFATE (2.5 MG/3ML) 0.083% IN NEBU: 3.0000 mL | INHALATION_SOLUTION | RESPIRATORY_TRACT | Status: DC | PRN

## 2021-06-25 MED ORDER — FOLIC ACID 1 MG PO TABS
1.0000 mg | ORAL_TABLET | Freq: Every day | ORAL | Status: DC
  Administered 2021-06-25 – 2021-06-28 (×4): 1 mg via ORAL
  Filled 2021-06-25 (×4): qty 1

## 2021-06-25 MED ORDER — SODIUM CHLORIDE 0.9 % IV SOLN
1.0000 g | INTRAVENOUS | Status: AC
Start: 2021-06-25 — End: 2021-06-26
  Administered 2021-06-25: 1 g via INTRAVENOUS
  Filled 2021-06-25 (×2): qty 10

## 2021-06-25 MED ORDER — LEVOTHYROXINE SODIUM 88 MCG PO TABS
88.0000 ug | ORAL_TABLET | Freq: Every day | ORAL | Status: DC
  Administered 2021-06-25 – 2021-06-28 (×4): 88 ug via ORAL
  Filled 2021-06-25 (×4): qty 1

## 2021-06-25 MED ORDER — FERROUS SULFATE 325 (65 FE) MG PO TABS
325.0000 mg | ORAL_TABLET | Freq: Every day | ORAL | Status: DC
  Administered 2021-06-25: 325 mg via ORAL
  Filled 2021-06-25: qty 1

## 2021-06-25 MED ORDER — DEXTROSE 5 % IV SOLN: INTRAVENOUS | Status: DC

## 2021-06-25 MED ORDER — ALLOPURINOL 100 MG PO TABS
100.0000 mg | ORAL_TABLET | Freq: Every day | ORAL | Status: DC
  Administered 2021-06-25 – 2021-06-28 (×4): 100 mg via ORAL
  Filled 2021-06-25 (×4): qty 1

## 2021-06-25 MED ORDER — MAGNESIUM SULFATE 2 GM/50ML IV SOLN
2.0000 g | Freq: Once | INTRAVENOUS | Status: AC
  Administered 2021-06-25: 2 g via INTRAVENOUS
  Filled 2021-06-25: qty 50

## 2021-06-25 MED ORDER — INSULIN ASPART 100 UNIT/ML IJ SOLN: 0.0000 [IU] | Freq: Three times a day (TID) | INTRAMUSCULAR | Status: DC

## 2021-06-25 MED ORDER — ROSUVASTATIN CALCIUM 20 MG PO TABS
20.0000 mg | ORAL_TABLET | Freq: Every day | ORAL | Status: DC
  Administered 2021-06-25: 20 mg via ORAL
  Filled 2021-06-25: qty 1

## 2021-06-25 MED ORDER — PANTOPRAZOLE SODIUM 40 MG PO TBEC
40.0000 mg | DELAYED_RELEASE_TABLET | Freq: Every day | ORAL | Status: DC
  Administered 2021-06-25 – 2021-06-28 (×4): 40 mg via ORAL
  Filled 2021-06-25 (×4): qty 1

## 2021-06-25 MED ORDER — POTASSIUM CL IN DEXTROSE 5% 20 MEQ/L IV SOLN
20.0000 meq | INTRAVENOUS | Status: DC
  Administered 2021-06-25 – 2021-06-26 (×2): 20 meq via INTRAVENOUS
  Filled 2021-06-25 (×4): qty 1000

## 2021-06-25 NOTE — Plan of Care (Signed)
  Problem: Education: Goal: Knowledge of General Education information will improve Description Including pain rating scale, medication(s)/side effects and non-pharmacologic comfort measures Outcome: Progressing   Problem: Health Behavior/Discharge Planning: Goal: Ability to manage health-related needs will improve Outcome: Progressing   

## 2021-06-25 NOTE — ED Notes (Signed)
Making rounds, found patient soiled with urine. Peri care given to patient and new pure wick in place at this time. Patient repositioned in bed. Patient had full linen change. Patient given water to drink also.

## 2021-06-25 NOTE — ED Notes (Signed)
SpO2 monitor placed on pt's foot. Pt on 4 L oxygen by prior RN. On assessment pt is 100%, decreased oxygen to 2L, SpO2 remains 100%, will continue to wean pt off of oxygen.

## 2021-06-25 NOTE — Progress Notes (Addendum)
Joanna [name]  EMV:[id number] DOB: 1950-12-03 DOA: 06/24/2021 PCP: [name], MD    Brief Narrative:  70 yo who lives alone w/ a hx of HTN, DM2, CKD stage IV,idiopathic LUE spastic hemiplegia, hypothyroidism, and tobacco abuse who presented the the ED w/ confusion and a fall. She was found on the floor when a family member visited her. In the ED she was found to be hyponatremic with an abnormal UA and unrevealing CT head.   Consultants:  None  Code Status: FULL CODE  Antimicrobials:  Rocephin 9/9 >  DVT prophylaxis: Lovenox   Subjective: Resting comfortably in bed at the time of my visit.  Alert and conversant.  Denies acute issues.  Is mildly confused.  Does not appear to be in acute distress.  Assessment & Plan:  Altered mental status - toxic metabolic encephalopathy  CT head no acute pathology - likely due to UTI -treat same and follow symptoms  Sepsis POA due to UTI  met sepsis criteria on admission due to tachypnea, leukocytosis, source of infection - cont empiric abx -follow culture data to tailor antibiotics as able  Acute dehydration - lactic acidosis  Continue IV hydration  Acute kidney injury on CKD 4 creatinine 2.25 w/ baseline 1.2-1.8 within last 2 months - improving w/ hydration -monitor trend  Recent Labs  Lab 06/24/21 1925 06/25/21 0446 06/25/21 0909  CREATININE 2.25* 1.74* 1.67*     Prolonged QTc ( 529) Likely due to acute illness and electrolyte abnormalities -correct same and follow  Severe hypomagnesemia  Supplement via IV and follow trend  Hypernatremia Due to dehydration - cont IVF and follow trend   Hypokalemia  Due to poor intake as well as hypomagnesemia -replace and follow  Spastic hemiplegia of the left upper extremity (chronic)   DM2 - diet controlled  Monitor CBGs  Essential hypertension Blood pressure reasonably controlled at this time  Hypothyroidism Continue Synthroid  Anemia of chronic disease Stable, hemoglobin  within baseline range   Hyperlipidemia Continue Crestor  GERD Continue Protonix   Family Communication: No family present at time of exam Status is: Inpatient  Remains inpatient appropriate because:Inpatient level of care appropriate due to severity of illness  Dispo: The patient is from: Home              Anticipated d/c is to:  unclear               Patient currently is not medically stable to d/c.   Difficult to place patient No   Objective: Blood pressure 130/75, pulse 92, temperature 99 F (37.2 C), temperature source Oral, resp. rate (!) 25, height _0  (1.575 m), weight 55.2 kg, SpO2 99 %.  Intake/Output Summary (Last 24 hours) at 06/25/2021 0943 Last data filed at 06/25/2021 0845 Gross per 24 hour  Intake 261.66 ml  Output 1 ml  Net 260.66 ml   Filed Weights   06/24/21 1914  Weight: 55.2 kg    Examination: General: No acute respiratory distress Lungs: Clear to auscultation bilaterally without wheezes or crackles Cardiovascular: Regular rate and rhythm without murmur gallop or rub normal S1 and S2 Abdomen: Nontender, nondistended, soft, bowel sounds positive, no rebound, no ascites, no appreciable mass Extremities: No significant cyanosis, clubbing, or edema bilateral lower extremities  CBC: Recent Labs  Lab 06/24/21 1925 06/25/21 0446  WBC 17.7* 16.8*  NEUTROABS 15.7*  --   HGB 11.6* 9.6*  HCT 36.8 30.6*  MCV 94.4 93.9  PLT 297 260  Basic Metabolic Panel: Recent Labs  Lab 06/24/21 1925 06/25/21 0446  NA 148* 149*  K 4.0 3.2*  CL 112* 117*  CO2 20* 15*  GLUCOSE 120* 95  BUN 70* 64*  CREATININE 2.25* 1.74*  CALCIUM 8.4* 7.7*  MG  --  0.8*  PHOS  --  3.1   GFR: Estimated Creatinine Clearance: 24.1 mL/min (A) (by C-G formula based on SCr of 1.74 mg/dL (H)).  Liver Function Tests: Recent Labs  Lab 06/24/21 1925 06/25/21 0446  AST 115* 90*  ALT 42 36  ALKPHOS 72 59  BILITOT 0.8 0.7  PROT 7.2 5.8*  ALBUMIN 3.8 3.1*    Coagulation  Profile: Recent Labs  Lab 06/25/21 0446  INR 1.3*    HbA1C: Hgb A1c MFr Bld  Date/Time Value Ref Range Status  04/27/2021 12:20 PM 5.6 4.8 - 5.6 % Final    Comment:             Prediabetes: 5.7 - 6.4          Diabetes: >6.4          Glycemic control for adults with diabetes: <7.0   01/19/2021 10:10 AM 5.8 (H) 4.8 - 5.6 % Final    Comment:             Prediabetes: 5.7 - 6.4          Diabetes: >6.4          Glycemic control for adults with diabetes: <7.0     Recent Results (from the past 240 hour(s))  Blood culture (routine x 2)     Status: None (Preliminary result)   Collection Time: 06/24/21  8:15 PM   Specimen: BLOOD RIGHT WRIST  Result Value Ref Range Status   Specimen Description BLOOD RIGHT WRIST  Final   Special Requests   Final    BOTTLES DRAWN AEROBIC AND ANAEROBIC Blood Culture adequate volume   Culture   Final    NO GROWTH < 12 HOURS Performed at [facility], [address], [place], [place] [address]    Report Status PENDING  Incomplete  Blood culture (routine x 2)     Status: None (Preliminary result)   Collection Time: 06/24/21  8:21 PM   Specimen: Right Antecubital; Blood  Result Value Ref Range Status   Specimen Description RIGHT ANTECUBITAL  Final   Special Requests   Final    BOTTLES DRAWN AEROBIC AND ANAEROBIC Blood Culture adequate volume   Culture   Final    NO GROWTH < 12 HOURS Performed at [facility], [address] [place], [place] [address]    Report Status PENDING  Incomplete  Resp Panel by RT-PCR (Flu A&B, Covid) Nasopharyngeal Swab     Status: None   Collection Time: 06/24/21  9:05 PM   Specimen: Nasopharyngeal Swab; Nasopharyngeal(NP) swabs in vial transport medium  Result Value Ref Range Status   SARS Coronavirus 2 by RT PCR NEGATIVE NEGATIVE Final    Comment: (NOTE) SARS-CoV-2 target nucleic acids are NOT DETECTED.  The SARS-CoV-2 RNA is generally detectable in upper respiratory specimens during the acute phase of infection. The  lowest concentration of SARS-CoV-2 viral copies this assay can detect is 138 copies/mL. A negative result does not preclude SARS-Cov-2 infection and should not be used as the sole basis for treatment or other patient management decisions. A negative result may occur with  improper specimen collection/handling, submission of specimen other than nasopharyngeal swab, presence of viral mutation(s) within the areas targeted  by this assay, and inadequate number of viral copies(<138 copies/mL). A negative result must be combined with clinical observations, patient history, and epidemiological information. The expected result is Negative.  Fact Sheet for Patients:  EntrepreneurPulse.com.au  Fact Sheet for Healthcare Providers:  IncredibleEmployment.be  This test is no t yet approved or cleared by the [place] FDA and  has been authorized for detection and/or diagnosis of SARS-CoV-2 by FDA under an Emergency Use Authorization (EUA). This EUA will remain  in effect (meaning this test can be used) for the duration of the COVID-19 declaration under Section 564(b)(1) of the Act, 21 U.S.C.section 360bbb-3(b)(1), unless the authorization is terminated  or revoked sooner.       Influenza A by PCR NEGATIVE NEGATIVE Final   Influenza B by PCR NEGATIVE NEGATIVE Final    Comment: (NOTE) The Xpert Xpress SARS-CoV-2/FLU/RSV plus assay is intended as an aid in the diagnosis of influenza from Nasopharyngeal swab specimens and should not be used as a sole basis for treatment. Nasal washings and aspirates are unacceptable for Xpert Xpress SARS-CoV-2/FLU/RSV testing.  Fact Sheet for Patients: EntrepreneurPulse.com.au  Fact Sheet for Healthcare Providers: IncredibleEmployment.be  This test is not yet approved or cleared by the [place] FDA and has been authorized for detection and/or diagnosis of SARS-CoV-2 by FDA under  an Emergency Use Authorization (EUA). This EUA will remain in effect (meaning this test can be used) for the duration of the COVID-19 declaration under Section 564(b)(1) of the Act, 21 U.S.C. section 360bbb-3(b)(1), unless the authorization is terminated or revoked.  Performed at [facility], [address], [place], [place] [address]      Scheduled Meds:  enoxaparin (LOVENOX) injection  30 mg Subcutaneous Q24H   ferrous sulfate  325 mg Oral Q breakfast   folic acid  1 mg Oral Daily   levothyroxine  88 mcg Oral Q0600   pantoprazole  40 mg Oral Daily   rosuvastatin  20 mg Oral Daily   Continuous Infusions:  cefTRIAXone (ROCEPHIN)  IV       LOS: 1 day   Cherene Altes, MD [facility] Office  [phone] Pager - Text Page per Shea Evans  If 7PM-7AM, please contact night-coverage per Amion 06/25/2021, 9:43 AM

## 2021-06-25 NOTE — ED Notes (Signed)
Patient transported to CT 

## 2021-06-25 NOTE — ED Notes (Signed)
Pt malpositioned out in stretcher, body found sideways in bed with head in between side rails. Pt repositioned. [place] repositioned. Seizure pads placed on stretcher to ensure pt does not fall out of bed or gets injured.

## 2021-06-25 NOTE — ED Notes (Signed)
Attempted to give report x2 

## 2021-06-26 DIAGNOSIS — N179 Acute kidney failure, unspecified: Secondary | ICD-10-CM

## 2021-06-26 DIAGNOSIS — N184 Chronic kidney disease, stage 4 (severe): Secondary | ICD-10-CM

## 2021-06-26 LAB — CBC
HCT: 29.7 % — ABNORMAL LOW (ref 36.0–46.0)
Hemoglobin: 9.2 g/dL — ABNORMAL LOW (ref 12.0–15.0)
MCH: 28.9 pg (ref 26.0–34.0)
MCHC: 31 g/dL (ref 30.0–36.0)
MCV: 93.4 fL (ref 80.0–100.0)
Platelets: 241 10*3/uL (ref 150–400)
RBC: 3.18 MIL/uL — ABNORMAL LOW (ref 3.87–5.11)
RDW: 14 % (ref 11.5–15.5)
WBC: 11.6 10*3/uL — ABNORMAL HIGH (ref 4.0–10.5)
nRBC: 0 % (ref 0.0–0.2)

## 2021-06-26 LAB — COMPREHENSIVE METABOLIC PANEL
ALT: 38 U/L (ref 0–44)
AST: 66 U/L — ABNORMAL HIGH (ref 15–41)
Albumin: 2.8 g/dL — ABNORMAL LOW (ref 3.5–5.0)
Alkaline Phosphatase: 55 U/L (ref 38–126)
Anion gap: 9 (ref 5–15)
BUN: 52 mg/dL — ABNORMAL HIGH (ref 8–23)
CO2: 20 mmol/L — ABNORMAL LOW (ref 22–32)
Calcium: 8.4 mg/dL — ABNORMAL LOW (ref 8.9–10.3)
Chloride: 112 mmol/L — ABNORMAL HIGH (ref 98–111)
Creatinine, Ser: 1.45 mg/dL — ABNORMAL HIGH (ref 0.44–1.00)
GFR, Estimated: 39 mL/min — ABNORMAL LOW (ref >60)
Glucose, Bld: 157 mg/dL — ABNORMAL HIGH (ref 70–99)
Potassium: 2.7 mmol/L — CL (ref 3.5–5.1)
Sodium: 141 mmol/L (ref 135–145)
Total Bilirubin: 0.3 mg/dL (ref 0.3–1.2)
Total Protein: 5.3 g/dL — ABNORMAL LOW (ref 6.5–8.1)

## 2021-06-26 LAB — GLUCOSE, CAPILLARY
Glucose-Capillary: 102 mg/dL — ABNORMAL HIGH (ref 70–99)
Glucose-Capillary: 135 mg/dL — ABNORMAL HIGH (ref 70–99)
Glucose-Capillary: 148 mg/dL — ABNORMAL HIGH (ref 70–99)
Glucose-Capillary: 93 mg/dL (ref 70–99)

## 2021-06-26 LAB — HEMOGLOBIN A1C
Hgb A1c MFr Bld: 5.1 % (ref 4.8–5.6)
Mean Plasma Glucose: 99.67 mg/dL

## 2021-06-26 LAB — URINE CULTURE: Culture: NO GROWTH

## 2021-06-26 LAB — MAGNESIUM: Magnesium: 1.8 mg/dL (ref 1.7–2.4)

## 2021-06-26 MED ORDER — POTASSIUM CHLORIDE 10 MEQ/100ML IV SOLN
10.0000 meq | Freq: Once | INTRAVENOUS | Status: AC
  Administered 2021-06-26: 10 meq via INTRAVENOUS
  Filled 2021-06-26: qty 100

## 2021-06-26 MED ORDER — POTASSIUM CHLORIDE CRYS ER 20 MEQ PO TBCR
40.0000 meq | EXTENDED_RELEASE_TABLET | Freq: Once | ORAL | Status: AC
  Administered 2021-06-26: 40 meq via ORAL
  Filled 2021-06-26: qty 2

## 2021-06-26 MED ORDER — MAGNESIUM SULFATE 2 GM/50ML IV SOLN
2.0000 g | Freq: Once | INTRAVENOUS | Status: AC
  Administered 2021-06-26: 2 g via INTRAVENOUS
  Filled 2021-06-26: qty 50

## 2021-06-26 MED ORDER — ACETAMINOPHEN 325 MG PO TABS
650.0000 mg | ORAL_TABLET | ORAL | Status: DC | PRN
  Administered 2021-06-26: 650 mg via ORAL
  Filled 2021-06-26: qty 2

## 2021-06-26 MED ORDER — INSULIN ASPART 100 UNIT/ML IJ SOLN: 0.0000 [IU] | Freq: Three times a day (TID) | INTRAMUSCULAR | Status: DC

## 2021-06-26 MED ORDER — LACTATED RINGERS IV SOLN: INTRAVENOUS | Status: DC

## 2021-06-26 MED ORDER — DIPHENHYDRAMINE HCL 25 MG PO CAPS
25.0000 mg | ORAL_CAPSULE | Freq: Once | ORAL | Status: AC
  Administered 2021-06-26: 25 mg via ORAL
  Filled 2021-06-26: qty 1

## 2021-06-26 NOTE — Progress Notes (Signed)
I started Patients Rocephin and patient said she was itching and her face was getting bumps on it. I stopped this medication so it did not finish. I have advised doctor and asked for some Benadryl for patient.

## 2021-06-26 NOTE — Progress Notes (Signed)
Patient reported that she used CPAP machine at home. MD Girguis aware.

## 2021-06-26 NOTE — Progress Notes (Addendum)
Patient used CPAP briefly then wanted to stop.

## 2021-06-26 NOTE — Progress Notes (Addendum)
Progress Note    STEPHEN TURNBAUGH   XTG:[id number]  DOB: 05-11-1951  DOA: 06/24/2021     2  PCP: [name], MD  Initial CC: AMS, fall at home  Hospital Course: 70 yo who lives alone w/ a hx of HTN, DM2, CKD stage IV,idiopathic LUE spastic hemiplegia, hypothyroidism, and tobacco abuse who presented the the ED w/ confusion and a fall. She was found on the floor when a family member visited her. In the ED she was found to be hypernatremic with an unrevealing CT head. UA had granular casts and "many bacteria" but 0-5 WBC. She was started on Rocephin for possible UTI.  Interval History:  No events overnight.  Resting comfortably in bed when seen this morning.  Mentation seems to have improved since admission.  ROS: Constitutional: negative for chills and fevers, Respiratory: negative for cough and sputum, Cardiovascular: negative for chest pain, and Gastrointestinal: negative for abdominal pain  Assessment & Plan:  Acute toxic metabolic encephalopathy  CT head no acute pathology  - initial suspicion was UTI on admission however given no WBC on UA and negative nitrite/LE, I'm suspecting encephalopathy may have been moreso related to her dehydration and uremia on admission (BUN was 70, creat 2.25 initially) which has since now downtrended to 52 and 1.45 respectively with IVF and her mentation has seemed to improve  -No growth on urine culture.  Complete 3 days of Rocephin and stop, end date 06/26/2021  Acute kidney injury on CKD 4 creatinine 2.25 w/ baseline 1.2-1.8 within last 2 months - improving w/ hydration -monitor trend - suspect lactic acidosis due to dehydration   Sepsis ruled out SIRS - resolved Asymptomatic bacteruria  - differential was sepsis vs hemoconcentration of CBC - sepsis ruled out; empirically completing 3 days CTX; Ucx negative   Hypernatremia Due to dehydration - cont IVF and follow trend  - continue LR at least 1 more day  Prolonged QTc ( 529) Likely due to  acute illness and electrolyte abnormalities -correct same and follow  Hypomagnesemia  Supplement via IV and follow trend     Hypokalemia  Due to poor intake as well as hypomagnesemia -replace and follow   Spastic hemiplegia of the left upper extremity (chronic)   DM2 - diet controlled  Monitor CBGs  Essential hypertension Blood pressure reasonably controlled at this time  Hypothyroidism Continue Synthroid  Anemia of chronic disease Stable, hemoglobin within baseline range   Hyperlipidemia Continue Crestor  GERD Continue Protonix      Old records reviewed in assessment of this patient  Antimicrobials: Rocephin 9/9 >> 9/11  DVT prophylaxis: enoxaparin (LOVENOX) injection 30 mg Start: 06/25/21 0600 SCDs Start: 06/24/21 2321   Code Status:   Code Status: Full Code Family Communication:   Disposition Plan: Status is: Inpatient  Remains inpatient appropriate because:IV treatments appropriate due to intensity of illness or inability to take PO and Inpatient level of care appropriate due to severity of illness  Dispo: The patient is from: Home              Anticipated d/c is to: Home              Patient currently is not medically stable to d/c.   Difficult to place patient No Risk of unplanned readmission score: Unplanned Admission- Pilot do not use: 25.22   Objective: Blood pressure 132/80, pulse 81, temperature (!) 97.5 F (36.4 C), temperature source Oral, resp. rate 16, height 5\' 2"  (1.575 m), weight 55.2  kg, SpO2 99 %.  Examination: General appearance: alert, cooperative, and no distress Head: Normocephalic, without obvious abnormality, atraumatic Eyes:  EOMI Lungs: clear to auscultation bilaterally Heart: regular rate and rhythm and S1, S2 normal Abdomen: normal findings: bowel sounds normal and soft, non-tender Extremities:  no edema ; chronically contracted left hand Skin: mobility and turgor normal Neurologic: Grossly normal  Consultants:     Procedures:    Data Reviewed: I have personally reviewed following labs and imaging studies Results for orders placed or performed during the hospital encounter of 06/24/21 (from the past 24 hour(s))  Glucose, capillary     Status: Abnormal   Collection Time: 06/25/21  9:16 PM  Result Value Ref Range   Glucose-Capillary 145 (H) 70 - 99 mg/dL   Comment 1 Notify RN    Comment 2 Document in Chart   Comprehensive metabolic panel     Status: Abnormal   Collection Time: 06/26/21  4:49 AM  Result Value Ref Range   Sodium 141 135 - 145 mmol/L   Potassium 2.7 (LL) 3.5 - 5.1 mmol/L   Chloride 112 (H) 98 - 111 mmol/L   CO2 20 (L) 22 - 32 mmol/L   Glucose, Bld 157 (H) 70 - 99 mg/dL   BUN 52 (H) 8 - 23 mg/dL   Creatinine, Ser 1.45 (H) 0.44 - 1.00 mg/dL   Calcium 8.4 (L) 8.9 - 10.3 mg/dL   Total Protein 5.3 (L) 6.5 - 8.1 g/dL   Albumin 2.8 (L) 3.5 - 5.0 g/dL   AST 66 (H) 15 - 41 U/L   ALT 38 0 - 44 U/L   Alkaline Phosphatase 55 38 - 126 U/L   Total Bilirubin 0.3 0.3 - 1.2 mg/dL   GFR, Estimated 39 (L) >60 mL/min   Anion gap 9 5 - 15  CBC     Status: Abnormal   Collection Time: 06/26/21  4:49 AM  Result Value Ref Range   WBC 11.6 (H) 4.0 - 10.5 K/uL   RBC 3.18 (L) 3.87 - 5.11 MIL/uL   Hemoglobin 9.2 (L) 12.0 - 15.0 g/dL   HCT 29.7 (L) 36.0 - 46.0 %   MCV 93.4 80.0 - 100.0 fL   MCH 28.9 26.0 - 34.0 pg   MCHC 31.0 30.0 - 36.0 g/dL   RDW 14.0 11.5 - 15.5 %   Platelets 241 150 - 400 K/uL   nRBC 0.0 0.0 - 0.2 %  Magnesium     Status: None   Collection Time: 06/26/21  4:49 AM  Result Value Ref Range   Magnesium 1.8 1.7 - 2.4 mg/dL  Glucose, capillary     Status: Abnormal   Collection Time: 06/26/21  7:35 AM  Result Value Ref Range   Glucose-Capillary 102 (H) 70 - 99 mg/dL  Glucose, capillary     Status: Abnormal   Collection Time: 06/26/21 10:58 AM  Result Value Ref Range   Glucose-Capillary 135 (H) 70 - 99 mg/dL    Recent Results (from the past 240 hour(s))  Blood culture  (routine x 2)     Status: None (Preliminary result)   Collection Time: 06/24/21  8:15 PM   Specimen: BLOOD RIGHT WRIST  Result Value Ref Range Status   Specimen Description BLOOD RIGHT WRIST  Final   Special Requests   Final    BOTTLES DRAWN AEROBIC AND ANAEROBIC Blood Culture adequate volume   Culture   Final    NO GROWTH 2 DAYS Performed at [facility], [place]  [address] [place], [place] [address]    Report Status PENDING  Incomplete  Blood culture (routine x 2)     Status: None (Preliminary result)   Collection Time: 06/24/21  8:21 PM   Specimen: Right Antecubital; Blood  Result Value Ref Range Status   Specimen Description RIGHT ANTECUBITAL  Final   Special Requests   Final    BOTTLES DRAWN AEROBIC AND ANAEROBIC Blood Culture adequate volume   Culture   Final    NO GROWTH 2 DAYS Performed at [facility], [address], [place], [place] [address]    Report Status PENDING  Incomplete  Resp Panel by RT-PCR (Flu A&B, Covid) Nasopharyngeal Swab     Status: None   Collection Time: 06/24/21  9:05 PM   Specimen: Nasopharyngeal Swab; Nasopharyngeal(NP) swabs in vial transport medium  Result Value Ref Range Status   SARS Coronavirus 2 by RT PCR NEGATIVE NEGATIVE Final    Comment: (NOTE) SARS-CoV-2 target nucleic acids are NOT DETECTED.  The SARS-CoV-2 RNA is generally detectable in upper respiratory specimens during the acute phase of infection. The lowest concentration of SARS-CoV-2 viral copies this assay can detect is 138 copies/mL. A negative result does not preclude SARS-Cov-2 infection and should not be used as the sole basis for treatment or other patient management decisions. A negative result may occur with  improper specimen collection/handling, submission of specimen other than nasopharyngeal swab, presence of viral mutation(s) within the areas targeted by this assay, and inadequate number of viral copies(<138 copies/mL). A negative result must be combined with clinical  observations, patient history, and epidemiological information. The expected result is Negative.  Fact Sheet for Patients:  EntrepreneurPulse.com.au  Fact Sheet for Healthcare Providers:  IncredibleEmployment.be  This test is no t yet approved or cleared by the [place] FDA and  has been authorized for detection and/or diagnosis of SARS-CoV-2 by FDA under an Emergency Use Authorization (EUA). This EUA will remain  in effect (meaning this test can be used) for the duration of the COVID-19 declaration under Section 564(b)(1) of the Act, 21 U.S.C.section 360bbb-3(b)(1), unless the authorization is terminated  or revoked sooner.       Influenza A by PCR NEGATIVE NEGATIVE Final   Influenza B by PCR NEGATIVE NEGATIVE Final    Comment: (NOTE) The Xpert Xpress SARS-CoV-2/FLU/RSV plus assay is intended as an aid in the diagnosis of influenza from Nasopharyngeal swab specimens and should not be used as a sole basis for treatment. Nasal washings and aspirates are unacceptable for Xpert Xpress SARS-CoV-2/FLU/RSV testing.  Fact Sheet for Patients: EntrepreneurPulse.com.au  Fact Sheet for Healthcare Providers: IncredibleEmployment.be  This test is not yet approved or cleared by the [place] FDA and has been authorized for detection and/or diagnosis of SARS-CoV-2 by FDA under an Emergency Use Authorization (EUA). This EUA will remain in effect (meaning this test can be used) for the duration of the COVID-19 declaration under Section 564(b)(1) of the Act, 21 U.S.C. section 360bbb-3(b)(1), unless the authorization is terminated or revoked.  Performed at [facility], [address], [place], [place] [address]   Urine Culture     Status: None   Collection Time: 06/24/21  9:39 PM   Specimen: Urine, Catheterized  Result Value Ref Range Status   Specimen Description   Final    URINE, CATHETERIZED Performed at  [facility], [address] [place],  [address]    Special Requests   Final    NONE Performed at [facility], [place]  [address], [place], [place] [address]    Culture   Final    NO GROWTH Performed at [facility], [place] [address] [place], [place] [address]    Report Status 06/26/2021 FINAL  Final     Radiology Studies: DG Chest 2 View  Result Date: 06/24/2021 CLINICAL DATA:  Altered mental status EXAM: CHEST - 2 VIEW COMPARISON:  08/13/2017 FINDINGS: Lungs are well expanded, symmetric, and clear. No pneumothorax or pleural effusion. Cardiac size within normal limits. Pulmonary vascularity is normal. Osseous structures are age-appropriate. No acute bone abnormality. IMPRESSION: No active cardiopulmonary disease. Electronically Signed   By: Fidela Salisbury M.D.   On: 06/24/2021 20:54   CT HEAD WO CONTRAST (5MM)  Result Date: 06/25/2021 CLINICAL DATA:  Altered mental status. EXAM: CT HEAD WITHOUT CONTRAST TECHNIQUE: Contiguous axial images were obtained from the base of the skull through the vertex without intravenous contrast. COMPARISON:  Head CT dated 04/19/2021. FINDINGS: Brain: Mild age-related atrophy and chronic microvascular ischemic changes. There is no acute intracranial hemorrhage. No mass effect or midline shift. No extra-axial fluid collection. Vascular: No hyperdense vessel or unexpected calcification. Skull: Normal. Negative for fracture or focal lesion. Sinuses/Orbits: No acute finding. Other: There is apparent diffuse scalp edema. IMPRESSION: 1. No acute intracranial pathology. 2. Mild age-related atrophy and chronic microvascular ischemic changes. Electronically Signed   By: Anner Crete M.D.   On: 06/25/2021 00:52   CT HEAD WO CONTRAST (5MM)  Final Result    DG Chest 2 View  Final Result      Scheduled Meds:  allopurinol  100 mg Oral Daily   enoxaparin (LOVENOX) injection  30 mg Subcutaneous U98J   folic acid  1 mg Oral Daily   insulin aspart  0-9 Units  Subcutaneous TID WC   levothyroxine  88 mcg Oral Q0600   pantoprazole  40 mg Oral Daily   potassium chloride  40 mEq Oral Once   PRN Meds: acetaminophen Continuous Infusions:  cefTRIAXone (ROCEPHIN)  IV 1 g (06/25/21 2130)   lactated ringers 75 mL/hr at 06/26/21 0914     LOS: 2 days  Time spent: Greater than 50% of the 35 minute visit was spent in counseling/coordination of care for the patient as laid out in the A&P.   Dwyane Dee, MD [facility] 06/26/2021, 11:21 AM

## 2021-06-27 DIAGNOSIS — N171 Acute kidney failure with acute cortical necrosis: Secondary | ICD-10-CM

## 2021-06-27 LAB — BASIC METABOLIC PANEL
Anion gap: 8 (ref 5–15)
BUN: 30 mg/dL — ABNORMAL HIGH (ref 8–23)
CO2: 23 mmol/L (ref 22–32)
Calcium: 9.6 mg/dL (ref 8.9–10.3)
Chloride: 113 mmol/L — ABNORMAL HIGH (ref 98–111)
Creatinine, Ser: 1.25 mg/dL — ABNORMAL HIGH (ref 0.44–1.00)
GFR, Estimated: 47 mL/min — ABNORMAL LOW (ref >60)
Glucose, Bld: 93 mg/dL (ref 70–99)
Potassium: 4 mmol/L (ref 3.5–5.1)
Sodium: 144 mmol/L (ref 135–145)

## 2021-06-27 LAB — CBC WITH DIFFERENTIAL/PLATELET
Abs Immature Granulocytes: 0.06 10*3/uL (ref 0.00–0.07)
Basophils Absolute: 0 10*3/uL (ref 0.0–0.1)
Basophils Relative: 0 %
Eosinophils Absolute: 0.1 10*3/uL (ref 0.0–0.5)
Eosinophils Relative: 1 %
HCT: 27.3 % — ABNORMAL LOW (ref 36.0–46.0)
Hemoglobin: 8.5 g/dL — ABNORMAL LOW (ref 12.0–15.0)
Immature Granulocytes: 1 %
Lymphocytes Relative: 22 %
Lymphs Abs: 1.6 10*3/uL (ref 0.7–4.0)
MCH: 29.1 pg (ref 26.0–34.0)
MCHC: 31.1 g/dL (ref 30.0–36.0)
MCV: 93.5 fL (ref 80.0–100.0)
Monocytes Absolute: 0.3 10*3/uL (ref 0.1–1.0)
Monocytes Relative: 5 %
Neutro Abs: 5 10*3/uL (ref 1.7–7.7)
Neutrophils Relative %: 71 %
Platelets: 212 10*3/uL (ref 150–400)
RBC: 2.92 MIL/uL — ABNORMAL LOW (ref 3.87–5.11)
RDW: 13.9 % (ref 11.5–15.5)
WBC: 7.1 10*3/uL (ref 4.0–10.5)
nRBC: 0 % (ref 0.0–0.2)

## 2021-06-27 LAB — GLUCOSE, CAPILLARY
Glucose-Capillary: 93 mg/dL (ref 70–99)
Glucose-Capillary: 89 mg/dL (ref 70–99)
Glucose-Capillary: 83 mg/dL (ref 70–99)
Glucose-Capillary: 107 mg/dL — ABNORMAL HIGH (ref 70–99)

## 2021-06-27 LAB — MAGNESIUM: Magnesium: 2 mg/dL (ref 1.7–2.4)

## 2021-06-27 MED ORDER — TOPIRAMATE 25 MG PO TABS
50.0000 mg | ORAL_TABLET | Freq: Two times a day (BID) | ORAL | Status: DC
  Administered 2021-06-27 – 2021-06-28 (×3): 50 mg via ORAL
  Filled 2021-06-27 (×3): qty 2

## 2021-06-27 MED ORDER — LOSARTAN POTASSIUM 50 MG PO TABS
100.0000 mg | ORAL_TABLET | Freq: Every day | ORAL | Status: DC
  Administered 2021-06-27 – 2021-06-28 (×2): 100 mg via ORAL
  Filled 2021-06-27 (×2): qty 2

## 2021-06-27 MED ORDER — MAGNESIUM OXIDE -MG SUPPLEMENT 400 (240 MG) MG PO TABS
200.0000 mg | ORAL_TABLET | Freq: Every day | ORAL | Status: DC
  Administered 2021-06-27 – 2021-06-28 (×2): 200 mg via ORAL
  Filled 2021-06-27 (×2): qty 1

## 2021-06-27 MED ORDER — TRAZODONE HCL 50 MG PO TABS
25.0000 mg | ORAL_TABLET | Freq: Every day | ORAL | Status: DC
  Administered 2021-06-27: 50 mg via ORAL
  Filled 2021-06-27: qty 1

## 2021-06-27 MED ORDER — DIPHENHYDRAMINE HCL 25 MG PO CAPS: 25.0000 mg | ORAL_CAPSULE | Freq: Three times a day (TID) | ORAL | Status: DC | PRN

## 2021-06-27 MED ORDER — SODIUM CHLORIDE 0.45 % IV SOLN: INTRAVENOUS | Status: DC

## 2021-06-27 MED ORDER — POTASSIUM CHLORIDE CRYS ER 20 MEQ PO TBCR
20.0000 meq | EXTENDED_RELEASE_TABLET | Freq: Two times a day (BID) | ORAL | Status: DC
  Administered 2021-06-27 – 2021-06-28 (×3): 20 meq via ORAL
  Filled 2021-06-27 (×3): qty 1

## 2021-06-27 NOTE — Progress Notes (Signed)
Pt refused CPAP for this evening. Pt is resting comfortably on RA sat 100%. RT will cont to monitor.

## 2021-06-27 NOTE — TOC Initial Note (Signed)
Transition of Care [facility]) - Initial/Assessment Note    Patient Details  Name: Joanna [name] MRN: [id number] Date of Birth: Apr 28, 1951  [facility]) CM/SW Contact:    Iona Beard, [place] Phone Number: 06/27/2021, 1:46 PM  Clinical Narrative:                 TOC updated on PT recommendation for SNF. CSW spoke with pt to complete assessment. Pt states that she lives alone. Pt states she is able to complete her ADLs independently. Pt does not drive but her friend provides transportation when needed. Pt has had [facility] services in the past. Pt has a cane to use when needed. CSW spoke with pt about interest in SNF at discharge, pt would prefer this and would like referral sent to [facility] facilities only. CSW completed PASRR and Fl2. Referral sent out to local facilities.   Addendum 1:49pm: CSW spoke with pt about current bed offers. Pt states that she feels she would rather return home with [facility] services. CSW sent referral to Judson Roch with [facility], they are not in network with pts insurance. CSW sent referral to Marjory Lies with [facility], he will let CSW know if they can accept.   Expected Discharge Plan: [place] Barriers to Discharge: Continued Medical Work up   Patient Goals and CMS Choice Patient states their goals for this hospitalization and ongoing recovery are:: Home with [facility] CMS Medicare.gov Compare Post Acute Care list provided to:: Patient Choice offered to / list presented to : Patient  Expected Discharge Plan and Services Expected Discharge Plan: [place] In-house Referral: Clinical Social Work Discharge Planning Services: CM Consult Post Acute Care Choice: [place] arrangements for the past 2 months: Apartment                                      Prior Living Arrangements/Services Living arrangements for the past 2 months: Apartment Lives with:: Self Patient language and need for interpreter reviewed:: Yes Do you  feel safe going back to the place where you live?: Yes      Need for Family Participation in Patient Care: No (Comment) Care giver support system in place?: Yes (comment) Current home services: DME Criminal Activity/Legal Involvement Pertinent to Current Situation/Hospitalization: No - Comment as needed  Activities of Daily Living Home Assistive Devices/Equipment: Cane (specify quad or straight) ADL Screening (condition at time of admission) Patient's cognitive ability adequate to safely complete daily activities?: Yes Is the patient deaf or have difficulty hearing?: Yes Does the patient have difficulty seeing, even when wearing glasses/contacts?: No Does the patient have difficulty concentrating, remembering, or making decisions?: No Patient able to express need for assistance with ADLs?: Yes Does the patient have difficulty dressing or bathing?: Yes Independently performs ADLs?: No Communication: Independent Dressing (OT): Needs assistance Is this a change from baseline?: Change from baseline, expected to last <3days Grooming: Needs assistance Is this a change from baseline?: Change from baseline, expected to last <3 days Feeding: Needs assistance Is this a change from baseline?: Change from baseline, expected to last <3 days Bathing: Needs assistance Is this a change from baseline?: Change from baseline, expected to last <3 days Toileting: Needs assistance Is this a change from baseline?: Change from baseline, expected to last <3 days In/Out Bed: Needs assistance Is this a change from baseline?: Change from baseline, expected to  last <3 days Walks in Home: Needs assistance Is this a change from baseline?: Change from baseline, expected to last <3 days Does the patient have difficulty walking or climbing stairs?: Yes Weakness of Legs: Both Weakness of Arms/Hands: Left (contracted)  Permission Sought/Granted                  Emotional Assessment Appearance:: Appears  stated age Attitude/Demeanor/Rapport: Engaged Affect (typically observed): Accepting Orientation: : Oriented to Self, Oriented to Place, Oriented to  Time, Oriented to Situation Alcohol / Substance Use: Not Applicable Psych Involvement: No (comment)  Admission diagnosis:  UTI (urinary tract infection) [N39.0] Sepsis, due to unspecified organism, unspecified whether acute organ dysfunction present [facility]) [A41.9] Patient Active Problem List   Diagnosis Date Noted   Acute renal failure superimposed on stage 4 chronic kidney disease ([place]) 06/26/2021   Dehydration 06/25/2021   AKI (acute kidney injury) ([place]) 06/25/2021   Fall at home, initial encounter 06/25/2021   Hypernatremia 06/25/2021   Dizziness and giddiness 04/19/2021   Hypomagnesemia 04/19/2021   Hypokalemia 04/19/2021   Near syncope 04/19/2021   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 ([place]) 01/19/2021   Non-recurrent acute suppurative otitis media of left ear without spontaneous rupture of tympanic membrane 01/06/2021   Seasonal allergic rhinitis due to pollen 01/06/2021   Abnormal CT scan, stomach 02/06/2020   Coronary artery disease due to lipid rich plaque 11/19/2019   Aortic atherosclerosis ([place]) 11/19/2019   Constipation 10/06/2019   AVM (arteriovenous malformation) of small bowel, acquired    IDA (iron deficiency anemia) 12/03/2018   Sleep apnea 12/25/2017   Right carpal tunnel syndrome 12/24/2017   Chronic anxiety 02/13/2017   Right rotator cuff tendinitis 02/11/2015   Iron deficiency 08/25/2014   Microcytic anemia 06/17/2014   Insomnia 05/04/2014   Hypothyroidism 06/05/2013   Renal insufficiency 06/05/2013   Essential hypertension, benign 06/05/2013   Stress fracture of right foot 02/27/2013   Chronic radicular lumbar pain 02/27/2013   Hyperlipidemia 01/16/2013   Type 2 diabetes mellitus with stage 4 chronic kidney disease, without long-term current use of insulin ([place])  01/16/2013   PCP:  [name], MD Pharmacy:   [facility] ([place], [place] [address] Phone: [phone] Fax: [phone]  [facility] [phone] - [place], [address] AT [place] [address] [place] [address] Phone: [phone] Fax: [phone]     Social Determinants of Health (SDOH) Interventions    Readmission Risk Interventions Readmission Risk Prevention Plan 06/27/2021  Transportation Screening Complete  HRI or Home Care Consult Complete  Social Work Consult for [place] Planning/Counseling Complete  Palliative Care Screening Not Applicable  Medication Review [employer]) Complete  Some recent data might be hidden

## 2021-06-27 NOTE — Evaluation (Signed)
Physical Therapy Evaluation Patient Details Name: Joanna [name] MRN: [id number] DOB: 1950/11/24 Today's Date: 06/27/2021  History of Present Illness  Joanna [name] is a 70 y.o. female with medical history significant for hypertension, type 2 diabetes mellitus, CKD 4, idiopathic left upper extremity spastic hemiplegia, hypothyroidism and tobacco abuse who presents to the emergency department via EMS due to fall and confusion.  Patient was awake and alert, though she was only oriented to self.  She thought she was at home and she denies any history of fall.  Rest of the history was obtained from the ED physician and ED medical record.  Per report, patient lives alone, she is confused at baseline but without history of dementia.  Family member went to visit her today and found on the floor with a strong odor of urine.  She was presumed to have UTI and was sent to the ED for further evaluation and management.   Clinical Impression  Patient demonstrates slow labored movement for sitting up at bedside, unable to maintain standing balance without AD and fell backwards into bed when attempting to take steps without SPC.  Patient able to ambulate to bathroom and required increased time to transfer to/from commode with Min assist and tolerated sitting up in chair after therapy - NT notified.  Patient will benefit from continued physical therapy in hospital and recommended venue below to increase strength, balance, endurance for safe ADLs and gait.         Recommendations for follow up therapy are one component of a multi-disciplinary discharge planning process, led by the attending physician.  Recommendations may be updated based on patient status, additional functional criteria and insurance authorization.  Follow Up Recommendations SNF;Supervision for mobility/OOB;Supervision - Intermittent    Equipment Recommendations  None recommended by PT    Recommendations for Other Services       Precautions /  Restrictions Precautions Precautions: Fall Precaution Comments: left UE contracture Restrictions Weight Bearing Restrictions: No      Mobility  Bed Mobility Overal bed mobility: Needs Assistance Bed Mobility: Supine to Sit     Supine to sit: Min assist     General bed mobility comments: increased time, labored movement, required use of bed rail and Min assist for supine to sitting    Transfers Overall transfer level: Needs assistance Equipment used: None;Straight cane Transfers: Sit to/from [facility] Sit to Stand: Min assist Stand pivot transfers: Min assist       General transfer comment: unable to maintain standing balance without AD, required use of SPC due to falling backwards  Ambulation/Gait Ambulation/Gait assistance: Min assist Gait Distance (Feet): 20 Feet Assistive device: Straight cane Gait Pattern/deviations: Decreased step length - right;Decreased step length - left;Decreased stride length;Trunk flexed Gait velocity: decreased   General Gait Details: slow labored cadence having to lean on cane due to weakness and limited to taking steps in room due to fatigue  Stairs            Wheelchair Mobility    Modified Rankin (Stroke Patients Only)       Balance Overall balance assessment: Needs assistance Sitting-balance support: Feet supported;No upper extremity supported Sitting balance-Leahy Scale: Fair Sitting balance - Comments: fair/good seated at EOB   Standing balance support: During functional activity;No upper extremity supported Standing balance-Leahy Scale: Poor Standing balance comment: fair/poor using SPC  Pertinent Vitals/Pain Pain Assessment: No/denies pain    Home Living Family/patient expects to be discharged to:: Private residence Living Arrangements: Alone Available Help at Discharge: Friend(s);Available PRN/intermittently Type of Home: Apartment Home Access: Level  entry     Home Layout: One level Home Equipment: Cane - single point;Shower seat;Grab bars - tub/shower      Prior Function Level of Independence: Needs assistance   Gait / Transfers Assistance Needed: household ambulator with SPC PRN  ADL's / Homemaking Assistance Needed: assisted by friend for community ADLs        Hand Dominance   Dominant Hand: Right    Extremity/Trunk Assessment   Upper Extremity Assessment Upper Extremity Assessment: Defer to OT evaluation LUE Deficits / Details: Baseline LUE non use due to contracture. Left hand remains in fisted position with thumb tucked into palm. Elbow flexed approximately 45 degrees with shoulder adducted and internally rotation.    Lower Extremity Assessment Lower Extremity Assessment: Generalized weakness    Cervical / Trunk Assessment Cervical / Trunk Assessment: Normal  Communication   Communication: No difficulties  Cognition Arousal/Alertness: Awake/alert Behavior During Therapy: WFL for tasks assessed/performed Overall Cognitive Status: Within Functional Limits for tasks assessed                                        General Comments General comments (skin integrity, edema, etc.): Pt demonstrated no balance deficits while performing static sitting  activity. When standing at sink during dynamic standing task, patient was provided close supervision/min guard for safely. Demonstrated slight wobble during dynamic task with short shuffll movements of feet.    Exercises     Assessment/Plan    PT Assessment Patient needs continued PT services  PT Problem List Decreased strength;Decreased activity tolerance;Decreased balance;Decreased mobility       PT Treatment Interventions DME instruction;Gait training;Stair training;Functional mobility training;Therapeutic activities;Therapeutic exercise;Balance training;Patient/family education    PT Goals (Current goals can be found in the Care Plan section)   Acute Rehab PT Goals Patient Stated Goal: To get stronger to go home. PT Goal Formulation: With patient Time For Goal Achievement: 07/11/21 Potential to Achieve Goals: Good    Frequency Min 3X/week   Barriers to discharge        Co-evaluation PT/OT/SLP Co-Evaluation/Treatment: Yes Reason for Co-Treatment: To address functional/ADL transfers PT goals addressed during session: Mobility/safety with mobility;Balance;Proper use of DME OT goals addressed during session: ADL's and self-care;Strengthening/ROM;Proper use of Adaptive equipment and DME       AM-PAC PT "6 Clicks" Mobility  Outcome Measure Help needed turning from your back to your side while in a flat bed without using bedrails?: A Little Help needed moving from lying on your back to sitting on the side of a flat bed without using bedrails?: A Little Help needed moving to and from a bed to a chair (including a wheelchair)?: A Little Help needed standing up from a chair using your arms (e.g., wheelchair or bedside chair)?: A Little Help needed to walk in hospital room?: A Lot Help needed climbing 3-5 steps with a railing? : A Lot 6 Click Score: 16    End of Session   Activity Tolerance: Patient tolerated treatment well;Patient limited by fatigue Patient left: in chair;with call bell/phone within reach Nurse Communication: Mobility status PT Visit Diagnosis: Unsteadiness on feet (R26.81);Other abnormalities of gait and mobility (R26.89);Muscle weakness (generalized) (M62.81)    Time: 7035-0093  PT Time Calculation (min) (ACUTE ONLY): 30 min   Charges:   PT Evaluation $PT Eval Moderate Complexity: 1 Mod PT Treatments $Therapeutic Activity: 23-37 mins        10:49 AM, 06/27/21 Lonell Grandchild, MPT Physical Therapist with [facility] [phone] office [phone] mobile phone

## 2021-06-27 NOTE — Evaluation (Signed)
Occupational Therapy Evaluation Patient Details Name: Joanna [name] MRN: [id number] DOB: [birth year] Today's Date: 06/27/2021   History of Present Illness 70 year old female with history of hypertension, type 2 diabetes not on treatment, CKD stage IV, idiopathic left upper extremity spastic hemiplegia, hypothyroidism presents to the emergency room with confusion and fall at home.  She was found on the floor by her family member visiting her.   Clinical Impression   Patient in bathroom with PT during their evaluation and agreeable to allow OT to complete evaluation. Patient overall demonstrating decreased activity tolerance and endurance when completing basic ADL tasks which causes her to fatigue quickly and be at risk for falls when energy level is depleted. At this level of performance, I recommend discharge to SNF for short term rehab prior to returning home alone. Will continue to monitor patient during hospital stay and update discharge recommendation as appropriate.      Recommendations for follow up therapy are one component of a multi-disciplinary discharge planning process, led by the attending physician.  Recommendations may be updated based on patient status, additional functional criteria and insurance authorization.   Follow Up Recommendations  SNF    Equipment Recommendations  None recommended by OT       Precautions / Restrictions Precautions Precautions: Fall;Other (comment) Precaution Comments: left UE contracture Restrictions Weight Bearing Restrictions: No      Mobility Bed Mobility Overal bed mobility:  (See PT Evaluation)        Transfers             General transfer comment: See PT evaluation    Balance Overall balance assessment: Mild deficits observed, not formally tested             ADL either performed or assessed with clinical judgement   ADL Overall ADL's : Needs assistance/impaired     Grooming: Wash/dry  hands;Standing;Supervision/safety   Upper Body Bathing: Supervision/ safety;Sitting   Lower Body Bathing: Minimal assistance;Sit to/from stand;Sitting/lateral leans   Upper Body Dressing : Minimal assistance;Sitting   Lower Body Dressing: Minimal assistance;Sit to/from stand;Sitting/lateral leans   Toilet Transfer: Min guard;Regular Toilet;Grab bars (single point cane)   Toileting- Water quality scientist and Hygiene: Min guard;Sit to/from stand;Sitting/lateral lean         General ADL Comments: Provided close supervision to Min guard during evaluation. Pt reported feeling dizzying when up ambulating in bathroom.     Vision Baseline Vision/History: 0 No visual deficits Ability to See in Adequate Light: 0 Adequate Patient Visual Report: No change from baseline Vision Assessment?: No apparent visual deficits            Pertinent Vitals/Pain Pain Assessment: No/denies pain     Hand Dominance Right   Extremity/Trunk Assessment Upper Extremity Assessment Upper Extremity Assessment: LUE deficits/detail (RUE Overall WFL for tasks assessed) LUE Deficits / Details: Baseline LUE non use due to contracture. Left hand remains in fisted position with thumb tucked into palm. Elbow flexed approximately 45 degrees with shoulder adducted and internally rotation.   Lower Extremity Assessment Lower Extremity Assessment: Defer to PT evaluation       Communication Communication Communication: No difficulties   Cognition Arousal/Alertness: Awake/alert Behavior During Therapy: WFL for tasks assessed/performed Overall Cognitive Status: Within Functional Limits for tasks assessed                                     General Comments  Pt demonstrated  no balance deficits while performing static sitting  activity. When standing at sink during dynamic standing task, patient was provided close supervision/min guard for safely. Demonstrated slight wobble during dynamic task with  short shuffll movements of feet.            Home Living Family/patient expects to be discharged to:: Private residence Living Arrangements: Alone Available Help at Discharge: Friend(s);Available PRN/intermittently Type of Home: Apartment Home Access: Level entry     Home Layout: One level     Bathroom Shower/Tub: Teacher, early years/pre: Standard Bathroom Accessibility: Yes   Home Equipment: Cane - single point;Shower seat;Grab bars - tub/shower          Prior Functioning/Environment Level of Independence: Independent with assistive device(s)  Gait / Transfers Assistance Needed: household ambulator with SPC PRN              OT Problem List: Decreased activity tolerance;Impaired balance (sitting and/or standing)      OT Treatment/Interventions: Self-care/ADL training;Therapeutic activities;Energy conservation;Patient/family education;Balance training;DME and/or AE instruction;Neuromuscular education;Therapeutic exercise    OT Goals(Current goals can be found in the care plan section) Acute Rehab OT Goals Patient Stated Goal: To get stronger to go home. OT Goal Formulation: With patient Time For Goal Achievement: 07/11/21 Potential to Achieve Goals: Good  OT Frequency: Min 3X/week           Co-evaluation PT/OT/SLP Co-Evaluation/Treatment: Yes Reason for Co-Treatment: To address functional/ADL transfers   OT goals addressed during session: ADL's and self-care;Strengthening/ROM;Proper use of Adaptive equipment and DME      AM-PAC OT "6 Clicks" Daily Activity     Outcome Measure Help from another person eating meals?: None Help from another person taking care of personal grooming?: None Help from another person toileting, which includes using toliet, bedpan, or urinal?: A Little Help from another person bathing (including washing, rinsing, drying)?: A Little Help from another person to put on and taking off regular upper body clothing?: A  Little Help from another person to put on and taking off regular lower body clothing?: A Little 6 Click Score: 20   End of Session Equipment Utilized During Treatment: Gait belt;Other (comment) [facility])  Activity Tolerance: Patient tolerated treatment well Patient left: in bed;with call bell/phone within reach  OT Visit Diagnosis: Muscle weakness (generalized) (M62.81)                Time: 3524-8185 OT Time Calculation (min): 10 min Charges:  OT General Charges $OT Visit: 1 Visit OT Evaluation $OT Eval Low Complexity: [place], OTR/L,CBIS  [phone]   Linzie Criss, Clarene Duke 06/27/2021, 10:37 AM

## 2021-06-27 NOTE — Plan of Care (Signed)
  Problem: Acute Rehab PT Goals(only PT should resolve) Goal: Pt Will Go Supine/Side To Sit Outcome: Progressing Flowsheets (Taken 06/27/2021 1052) Pt will go Supine/Side to Sit:  with supervision  with min guard assist Goal: Patient Will Perform Sitting Balance Outcome: Progressing Flowsheets (Taken 06/27/2021 1052) Patient will perform sitting balance:  with supervision  with min guard assist Goal: Pt Will Transfer Bed To Chair/Chair To Bed Outcome: Progressing Flowsheets (Taken 06/27/2021 1052) Pt will Transfer Bed to Chair/Chair to Bed:  with supervision  min guard assist Goal: Pt Will Ambulate Outcome: Progressing Flowsheets (Taken 06/27/2021 1052) Pt will Ambulate:  50 feet  with supervision  with min guard assist  with cane   10:52 AM, 06/27/21 Lonell Grandchild, MPT Physical Therapist with [facility] [phone] office [phone] mobile phone

## 2021-06-27 NOTE — Plan of Care (Signed)
  Problem: Acute Rehab OT Goals (only OT should resolve) Goal: Pt. Will Transfer To Toilet Flowsheets (Taken 06/27/2021 1041) Pt Will Transfer to Toilet:  with modified independence  ambulating  grab bars Goal: Pt. Will Perform Toileting-Clothing Manipulation Flowsheets (Taken 06/27/2021 1041) Pt Will Perform Toileting - Clothing Manipulation and hygiene:  with modified independence  sit to/from stand  sitting/lateral leans Goal: Pt/Caregiver Will Perform Home Exercise Program Flowsheets (Taken 06/27/2021 1041) Pt/caregiver will Perform Home Exercise Program:  Increased strength  Right Upper extremity  Independently  With written HEP provided Goal: OT Additional ADL Goal #1 Flowsheets (Taken 06/27/2021 1041) Additional ADL Goal #1: Patient will increase her activity tolerance while completing a morning ADL task at Mod I level for greater than 15 minutes without report of fatigue while utilizing energy conservation techniques if needed.

## 2021-06-27 NOTE — Progress Notes (Signed)
PROGRESS NOTE    Joanna [name]  NKN:[id number] DOB: 1951/02/20 DOA: 06/24/2021 PCP: [name], MD    Brief Narrative:  70 year old female with history of hypertension, type 2 diabetes not on treatment, CKD stage IV, idiopathic left upper extremity spastic hemiplegia, hypothyroidism presents to the emergency room with confusion and fall at home.  She was found on the floor by her family member visiting her.  She lives at home by herself.  Patient thinks she was working and cleaning house, got tired and could not get up.  In the emergency room, she was hypernatremic, trauma series was negative.  Urinalysis with suspected UTI.  Multiple electrolyte abnormalities.  Admitted and treated for electrolyte abnormalities, acute kidney injury and UTI.   Assessment & Plan:   Principal Problem:   Acute renal failure superimposed on stage 4 chronic kidney disease (HCC) Active Problems:   Fall at home, initial encounter   Hypernatremia  Acute renal failure and hypernatremia superimposed on chronic kidney disease stage IV: Multifactorial.  Probably poor oral intake with ongoing use of losartan. Treated with isotonic fluid, sodium 144.  Renal functions improving to her baseline. With persistent high sodium, change IV fluid to half-normal saline.  Encourage oral free water intake.  Continue IV fluids today.  Recheck levels tomorrow morning. No evidence of urinary obstruction.  Baseline creatinine 1.2-1.8.  Suspected acute UTI present on admission: Cultures negative.  Rocephin completed for 3 days.  No indication to continue to treat.  Acute metabolic encephalopathy: Multifactorial.  Multiple electrolyte abnormalities and medications probably contributing.  Mental status improving.  CT head nonfocal. Work with PT OT today to determine next level of care.  Electrolytes: Hypomagnesemia: Magnesium 0.8 on presentation.  Chronic hypomagnesemia.  Replaced aggressively and normalized.  We will keep on a  scheduled oral replacement. Hypokalemia: Chronic and persistent.  Replace aggressively and keep on a schedule replacement.  Sleep apnea: Uses CPAP at night.  She can continue to use.  Essential hypertension: Blood pressure is stabilized now.  Resume home dose of losartan.  Hypothyroidism: On Synthroid that she will continue.  Type 2 diabetes: Diet controlled.  No indication to treat at this time.  She has poor appetite overall.  GERD: On PPI.     DVT prophylaxis: enoxaparin (LOVENOX) injection 30 mg Start: 06/25/21 0600 SCDs Start: 06/24/21 2321   Code Status: Full code Family Communication: None Disposition Plan: Status is: Inpatient  Remains inpatient appropriate because:IV treatments appropriate due to intensity of illness or inability to take PO and Inpatient level of care appropriate due to severity of illness  Dispo: The patient is from: Home              Anticipated d/c is to: Home with home health PT OT.              Patient currently is not medically stable to d/c.   Difficult to place patient No   Patient clinically stabilizing.  He start mobilizing with PT OT today.  Will need to make sure she is safe at home.  May need more support system at home.      Consultants:  None  Procedures:  None  Antimicrobials:  Rocephin 9/9-9/11   Subjective: Patient seen and examined.  She was eating breakfast.  Denies any complaints.  She has not slept for last 2 nights.  Wants some sleep medicine.  She uses trazodone at home. Still with poor appetite but overall improving.  Still feels weak.  She  has been to bedside commode but not mobilized yet.  Objective: Vitals:   06/26/21 1325 06/26/21 2159 06/26/21 2307 06/27/21 0439  BP: 130/69 (!) 122/92  (!) 137/98  Pulse: 94 80 (!) 101 82  Resp: 17 19 20 19   Temp: 98.4 F (36.9 C) 98.2 F (36.8 C)  98.2 F (36.8 C)  TempSrc:  Oral  Oral  SpO2: 99% 100% 96% 99%  Weight:      Height:        Intake/Output Summary  (Last 24 hours) at 06/27/2021 0815 Last data filed at 06/27/2021 0509 Gross per 24 hour  Intake 1062.16 ml  Output 800 ml  Net 262.16 ml   Filed Weights   06/24/21 1914  Weight: 55.2 kg    Examination:  General: Looks fairly comfortable at rest.  Eating breakfast.  On room air. Frail and debilitated.  Chronically sick looking. Cardiovascular: S1-S2 normal.  No added sounds. Respiratory: Bilateral clear.  No added sounds. Gastrointestinal: Soft.  Nontender.  Bowel sounds present. Ext: No cyanosis or edema.  No swelling. Neuro: Alert oriented x4. Left upper extremity with spastic paralysis, flexion at elbow.    Data Reviewed: I have personally reviewed following labs and imaging studies  CBC: Recent Labs  Lab 06/24/21 1925 06/25/21 0446 06/26/21 0449 06/27/21 0727  WBC 17.7* 16.8* 11.6* 7.1  NEUTROABS 15.7*  --   --  5.0  HGB 11.6* 9.6* 9.2* 8.5*  HCT 36.8 30.6* 29.7* 27.3*  MCV 94.4 93.9 93.4 93.5  PLT 297 260 241 741   Basic Metabolic Panel: Recent Labs  Lab 06/24/21 1925 06/25/21 0446 06/25/21 0909 06/26/21 0449 06/27/21 0727  NA 148* 149* 148* 141 144  K 4.0 3.2* 3.2* 2.7* 4.0  CL 112* 117* 118* 112* 113*  CO2 20* 15* 17* 20* 23  GLUCOSE 120* 95 89 157* 93  BUN 70* 64* 61* 52* 30*  CREATININE 2.25* 1.74* 1.67* 1.45* 1.25*  CALCIUM 8.4* 7.7* 7.6* 8.4* 9.6  MG  --  0.8*  --  1.8 2.0  PHOS  --  3.1  --   --   --    GFR: Estimated Creatinine Clearance: 33.6 mL/min (A) (by C-G formula based on SCr of 1.25 mg/dL (H)). Liver Function Tests: Recent Labs  Lab 06/24/21 1925 06/25/21 0446 06/26/21 0449  AST 115* 90* 66*  ALT 42 36 38  ALKPHOS 72 59 55  BILITOT 0.8 0.7 0.3  PROT 7.2 5.8* 5.3*  ALBUMIN 3.8 3.1* 2.8*   No results for input(s): LIPASE, AMYLASE in the last 168 hours. No results for input(s): AMMONIA in the last 168 hours. Coagulation Profile: Recent Labs  Lab 06/25/21 0446  INR 1.3*   Cardiac Enzymes: No results for input(s):  CKTOTAL, CKMB, CKMBINDEX, TROPONINI in the last 168 hours. BNP (last 3 results) No results for input(s): PROBNP in the last 8760 hours. HbA1C: Recent Labs    06/26/21 0449  HGBA1C 5.1   CBG: Recent Labs  Lab 06/26/21 0735 06/26/21 1058 06/26/21 1609 06/26/21 2156 06/27/21 0726  GLUCAP 102* 135* 148* 93 93   Lipid Profile: No results for input(s): CHOL, HDL, LDLCALC, TRIG, CHOLHDL, LDLDIRECT in the last 72 hours. Thyroid Function Tests: No results for input(s): TSH, T4TOTAL, FREET4, T3FREE, THYROIDAB in the last 72 hours. Anemia Panel: No results for input(s): VITAMINB12, FOLATE, FERRITIN, TIBC, IRON, RETICCTPCT in the last 72 hours. Sepsis Labs: Recent Labs  Lab 06/24/21 1925 06/24/21 2229 06/25/21 0446 06/25/21 0629  PROCALCITON  --   --  0.66  --   LATICACIDVEN 2.2* 2.1* 1.2 1.5    Recent Results (from the past 240 hour(s))  Blood culture (routine x 2)     Status: None (Preliminary result)   Collection Time: 06/24/21  8:15 PM   Specimen: BLOOD RIGHT WRIST  Result Value Ref Range Status   Specimen Description BLOOD RIGHT WRIST  Final   Special Requests   Final    BOTTLES DRAWN AEROBIC AND ANAEROBIC Blood Culture adequate volume   Culture   Final    NO GROWTH 2 DAYS Performed at [facility], [address], [place], [place] [address]    Report Status PENDING  Incomplete  Blood culture (routine x 2)     Status: None (Preliminary result)   Collection Time: 06/24/21  8:21 PM   Specimen: Right Antecubital; Blood  Result Value Ref Range Status   Specimen Description RIGHT ANTECUBITAL  Final   Special Requests   Final    BOTTLES DRAWN AEROBIC AND ANAEROBIC Blood Culture adequate volume   Culture   Final    NO GROWTH 2 DAYS Performed at [facility], [address] [place], [place] [address]    Report Status PENDING  Incomplete  Resp Panel by RT-PCR (Flu A&B, Covid) Nasopharyngeal Swab     Status: None   Collection Time: 06/24/21  9:05 PM   Specimen:  Nasopharyngeal Swab; Nasopharyngeal(NP) swabs in vial transport medium  Result Value Ref Range Status   SARS Coronavirus 2 by RT PCR NEGATIVE NEGATIVE Final    Comment: (NOTE) SARS-CoV-2 target nucleic acids are NOT DETECTED.  The SARS-CoV-2 RNA is generally detectable in upper respiratory specimens during the acute phase of infection. The lowest concentration of SARS-CoV-2 viral copies this assay can detect is 138 copies/mL. A negative result does not preclude SARS-Cov-2 infection and should not be used as the sole basis for treatment or other patient management decisions. A negative result may occur with  improper specimen collection/handling, submission of specimen other than nasopharyngeal swab, presence of viral mutation(s) within the areas targeted by this assay, and inadequate number of viral copies(<138 copies/mL). A negative result must be combined with clinical observations, patient history, and epidemiological information. The expected result is Negative.  Fact Sheet for Patients:  EntrepreneurPulse.com.au  Fact Sheet for Healthcare Providers:  IncredibleEmployment.be  This test is no t yet approved or cleared by the [place] FDA and  has been authorized for detection and/or diagnosis of SARS-CoV-2 by FDA under an Emergency Use Authorization (EUA). This EUA will remain  in effect (meaning this test can be used) for the duration of the COVID-19 declaration under Section 564(b)(1) of the Act, 21 U.S.C.section 360bbb-3(b)(1), unless the authorization is terminated  or revoked sooner.       Influenza A by PCR NEGATIVE NEGATIVE Final   Influenza B by PCR NEGATIVE NEGATIVE Final    Comment: (NOTE) The Xpert Xpress SARS-CoV-2/FLU/RSV plus assay is intended as an aid in the diagnosis of influenza from Nasopharyngeal swab specimens and should not be used as a sole basis for treatment. Nasal washings and aspirates are unacceptable for  Xpert Xpress SARS-CoV-2/FLU/RSV testing.  Fact Sheet for Patients: EntrepreneurPulse.com.au  Fact Sheet for Healthcare Providers: IncredibleEmployment.be  This test is not yet approved or cleared by the [place] FDA and has been authorized for detection and/or diagnosis of SARS-CoV-2 by FDA under an Emergency Use Authorization (EUA). This EUA will remain in effect (meaning this test can be used) for the duration of the  COVID-19 declaration under Section 564(b)(1) of the Act, 21 U.S.C. section 360bbb-3(b)(1), unless the authorization is terminated or revoked.  Performed at [facility], [address], [place], [place] [address]   Urine Culture     Status: None   Collection Time: 06/24/21  9:39 PM   Specimen: Urine, Catheterized  Result Value Ref Range Status   Specimen Description   Final    URINE, CATHETERIZED Performed at [facility], [address] [place], [place] [address]    Special Requests   Final    NONE Performed at [facility], [address], [place], [place] [address]    Culture   Final    NO GROWTH Performed at [facility], [place] [address], [place],  [address]    Report Status 06/26/2021 FINAL  Final         Radiology Studies: No results found.      Scheduled Meds:  allopurinol  100 mg Oral Daily   enoxaparin (LOVENOX) injection  30 mg Subcutaneous E31V   folic acid  1 mg Oral Daily   insulin aspart  0-7 Units Subcutaneous TID WC   levothyroxine  88 mcg Oral Q0600   pantoprazole  40 mg Oral Daily   Continuous Infusions:  sodium chloride       LOS: 3 days    Time spent: 30 minutes    Barb Merino, MD [facility] Pager [phone]

## 2021-06-27 NOTE — NC FL2 (Signed)
[place] MEDICAID FL2 LEVEL OF CARE SCREENING TOOL     IDENTIFICATION  Patient Name: Joanna [name] Birthdate: 05-07-1951 Sex: female Admission Date (Current Location): 06/24/2021  [facility] and [place] Number:  [employer] and Address:  [facility] [address] [place]      Provider Number: [id number]  Attending Physician Name and Address:  Barb Merino, MD  Relative Name and Phone Number:       Current Level of Care: Hospital Recommended Level of Care: [place] Prior Approval Number:    Date Approved/Denied:   PASRR Number: [id number]  Discharge Plan: SNF    Current Diagnoses: Patient Active Problem List   Diagnosis Date Noted   Acute renal failure superimposed on stage 4 chronic kidney disease ([place]) 06/26/2021   Dehydration 06/25/2021   AKI (acute kidney injury) ([place]) 06/25/2021   Fall at home, initial encounter 06/25/2021   Hypernatremia 06/25/2021   Dizziness and giddiness 04/19/2021   Hypomagnesemia 04/19/2021   Hypokalemia 04/19/2021   Near syncope 04/19/2021   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 ([place]) 01/19/2021   Non-recurrent acute suppurative otitis media of left ear without spontaneous rupture of tympanic membrane 01/06/2021   Seasonal allergic rhinitis due to pollen 01/06/2021   Abnormal CT scan, stomach 02/06/2020   Coronary artery disease due to lipid rich plaque 11/19/2019   Aortic atherosclerosis ([place]) 11/19/2019   Constipation 10/06/2019   AVM (arteriovenous malformation) of small bowel, acquired    IDA (iron deficiency anemia) 12/03/2018   Sleep apnea 12/25/2017   Right carpal tunnel syndrome 12/24/2017   Chronic anxiety 02/13/2017   Right rotator cuff tendinitis 02/11/2015   Iron deficiency 08/25/2014   Microcytic anemia 06/17/2014   Insomnia 05/04/2014   Hypothyroidism 06/05/2013   Renal insufficiency 06/05/2013   Essential hypertension,  benign 06/05/2013   Stress fracture of right foot 02/27/2013   Chronic radicular lumbar pain 02/27/2013   Hyperlipidemia 01/16/2013   Type 2 diabetes mellitus with stage 4 chronic kidney disease, without long-term current use of insulin ([place]) 01/16/2013    Orientation RESPIRATION BLADDER Height & Weight     Self, Time, Situation, Place  Normal Incontinent, External catheter Weight: 121 lb 11.2 oz (55.2 kg) Height:  5\' 2"  (157.5 cm)  BEHAVIORAL SYMPTOMS/MOOD NEUROLOGICAL BOWEL NUTRITION STATUS      Continent Diet  AMBULATORY STATUS COMMUNICATION OF NEEDS Skin   Extensive Assist Verbally Normal                       Personal Care Assistance Level of Assistance  Bathing, Feeding, Dressing, Total care Bathing Assistance: Limited assistance Feeding assistance: Independent Dressing Assistance: Limited assistance Total Care Assistance: Limited assistance   Functional Limitations Info  Sight, Hearing, Speech Sight Info: Adequate Hearing Info: Adequate Speech Info: Adequate    SPECIAL CARE FACTORS FREQUENCY  PT (By licensed PT), OT (By licensed OT)     PT Frequency: 5 times weekly OT Frequency: 5 times weekly            Contractures Contractures Info: Not present    Additional Factors Info  Code Status, Allergies Code Status Info: FULL Allergies Info: Zoloft (sertraline) and Ivp Dye (iodinated diagnostic agents)           Current Medications (06/27/2021):  This is the current hospital active medication list Current Facility-Administered Medications  Medication Dose Route Frequency Provider Last Rate Last Admin   0.45 % sodium  chloride infusion   Intravenous Continuous Barb Merino, MD 75 mL/hr at 06/27/21 0845 New Bag at 06/27/21 0845   acetaminophen (TYLENOL) tablet 650 mg  650 mg Oral Q4H PRN Dwyane Dee, MD   650 mg at 06/26/21 1437   allopurinol (ZYLOPRIM) tablet 100 mg  100 mg Oral Daily Joette Catching T, MD   100 mg at 06/27/21 0840   diphenhydrAMINE  (BENADRYL) capsule 25 mg  25 mg Oral Q8H PRN Barb Merino, MD       enoxaparin (LOVENOX) injection 30 mg  30 mg Subcutaneous Q24H Adefeso, Oladapo, DO   30 mg at 80/32/12 2482   folic acid (FOLVITE) tablet 1 mg  1 mg Oral Daily Adefeso, Oladapo, DO   1 mg at 06/27/21 0840   insulin aspart (novoLOG) injection 0-7 Units  0-7 Units Subcutaneous TID WC Dwyane Dee, MD       levothyroxine (SYNTHROID) tablet 88 mcg  88 mcg Oral Q0600 Adefeso, Oladapo, DO   88 mcg at 06/27/21 0540   losartan (COZAAR) tablet 100 mg  100 mg Oral Daily Barb Merino, MD   100 mg at 06/27/21 0839   magnesium oxide (MAG-OX) tablet 200 mg  200 mg Oral Daily Barb Merino, MD   200 mg at 06/27/21 0840   pantoprazole (PROTONIX) EC tablet 40 mg  40 mg Oral Daily Adefeso, Oladapo, DO   40 mg at 06/27/21 0841   potassium chloride SA (KLOR-CON) CR tablet 20 mEq  20 mEq Oral BID Barb Merino, MD   20 mEq at 06/27/21 0841   topiramate (TOPAMAX) tablet 50 mg  50 mg Oral BID Barb Merino, MD   50 mg at 06/27/21 0840   traZODone (DESYREL) tablet 25-50 mg  25-50 mg Oral QHS Barb Merino, MD         Discharge Medications: Please see discharge summary for a list of discharge medications.  Relevant Imaging Results:  Relevant Lab Results:   Additional Information SSN: [id number], [place]

## 2021-06-28 DIAGNOSIS — Z6820 Body mass index (BMI) 20.0-20.9, adult: Secondary | ICD-10-CM | POA: Diagnosis not present

## 2021-06-28 DIAGNOSIS — N184 Chronic kidney disease, stage 4 (severe): Secondary | ICD-10-CM | POA: Diagnosis not present

## 2021-06-28 DIAGNOSIS — E869 Volume depletion, unspecified: Secondary | ICD-10-CM | POA: Diagnosis not present

## 2021-06-28 DIAGNOSIS — Z91041 Radiographic dye allergy status: Secondary | ICD-10-CM | POA: Diagnosis not present

## 2021-06-28 DIAGNOSIS — R2681 Unsteadiness on feet: Secondary | ICD-10-CM | POA: Diagnosis not present

## 2021-06-28 DIAGNOSIS — G479 Sleep disorder, unspecified: Secondary | ICD-10-CM | POA: Diagnosis not present

## 2021-06-28 DIAGNOSIS — E039 Hypothyroidism, unspecified: Secondary | ICD-10-CM | POA: Diagnosis not present

## 2021-06-28 DIAGNOSIS — E876 Hypokalemia: Secondary | ICD-10-CM | POA: Diagnosis not present

## 2021-06-28 DIAGNOSIS — D649 Anemia, unspecified: Secondary | ICD-10-CM | POA: Diagnosis not present

## 2021-06-28 DIAGNOSIS — E119 Type 2 diabetes mellitus without complications: Secondary | ICD-10-CM | POA: Diagnosis not present

## 2021-06-28 DIAGNOSIS — E870 Hyperosmolality and hypernatremia: Secondary | ICD-10-CM | POA: Diagnosis not present

## 2021-06-28 DIAGNOSIS — G8114 Spastic hemiplegia affecting left nondominant side: Secondary | ICD-10-CM | POA: Diagnosis not present

## 2021-06-28 DIAGNOSIS — D509 Iron deficiency anemia, unspecified: Secondary | ICD-10-CM | POA: Diagnosis not present

## 2021-06-28 DIAGNOSIS — Z79899 Other long term (current) drug therapy: Secondary | ICD-10-CM | POA: Diagnosis not present

## 2021-06-28 DIAGNOSIS — W19XXXD Unspecified fall, subsequent encounter: Secondary | ICD-10-CM | POA: Diagnosis not present

## 2021-06-28 DIAGNOSIS — Z66 Do not resuscitate: Secondary | ICD-10-CM | POA: Diagnosis not present

## 2021-06-28 DIAGNOSIS — Y92009 Unspecified place in unspecified non-institutional (private) residence as the place of occurrence of the external cause: Secondary | ICD-10-CM | POA: Diagnosis not present

## 2021-06-28 DIAGNOSIS — N171 Acute kidney failure with acute cortical necrosis: Secondary | ICD-10-CM | POA: Diagnosis not present

## 2021-06-28 DIAGNOSIS — N179 Acute kidney failure, unspecified: Secondary | ICD-10-CM | POA: Diagnosis not present

## 2021-06-28 DIAGNOSIS — E1122 Type 2 diabetes mellitus with diabetic chronic kidney disease: Secondary | ICD-10-CM | POA: Diagnosis not present

## 2021-06-28 DIAGNOSIS — Z741 Need for assistance with personal care: Secondary | ICD-10-CM | POA: Diagnosis not present

## 2021-06-28 DIAGNOSIS — E43 Unspecified severe protein-calorie malnutrition: Secondary | ICD-10-CM | POA: Diagnosis not present

## 2021-06-28 DIAGNOSIS — M6281 Muscle weakness (generalized): Secondary | ICD-10-CM | POA: Diagnosis not present

## 2021-06-28 DIAGNOSIS — R000 Tachycardia, unspecified: Secondary | ICD-10-CM | POA: Diagnosis not present

## 2021-06-28 DIAGNOSIS — R531 Weakness: Secondary | ICD-10-CM | POA: Diagnosis not present

## 2021-06-28 DIAGNOSIS — K219 Gastro-esophageal reflux disease without esophagitis: Secondary | ICD-10-CM | POA: Diagnosis not present

## 2021-06-28 DIAGNOSIS — F17210 Nicotine dependence, cigarettes, uncomplicated: Secondary | ICD-10-CM | POA: Diagnosis not present

## 2021-06-28 DIAGNOSIS — Z515 Encounter for palliative care: Secondary | ICD-10-CM | POA: Diagnosis not present

## 2021-06-28 DIAGNOSIS — M24542 Contracture, left hand: Secondary | ICD-10-CM | POA: Diagnosis not present

## 2021-06-28 DIAGNOSIS — E860 Dehydration: Secondary | ICD-10-CM | POA: Diagnosis not present

## 2021-06-28 DIAGNOSIS — R4182 Altered mental status, unspecified: Secondary | ICD-10-CM | POA: Diagnosis not present

## 2021-06-28 DIAGNOSIS — G8194 Hemiplegia, unspecified affecting left nondominant side: Secondary | ICD-10-CM | POA: Diagnosis not present

## 2021-06-28 DIAGNOSIS — N1832 Chronic kidney disease, stage 3b: Secondary | ICD-10-CM | POA: Diagnosis not present

## 2021-06-28 DIAGNOSIS — G9341 Metabolic encephalopathy: Secondary | ICD-10-CM | POA: Diagnosis not present

## 2021-06-28 DIAGNOSIS — U071 COVID-19: Secondary | ICD-10-CM | POA: Diagnosis not present

## 2021-06-28 DIAGNOSIS — J159 Unspecified bacterial pneumonia: Secondary | ICD-10-CM | POA: Diagnosis not present

## 2021-06-28 DIAGNOSIS — Z888 Allergy status to other drugs, medicaments and biological substances status: Secondary | ICD-10-CM | POA: Diagnosis not present

## 2021-06-28 DIAGNOSIS — G319 Degenerative disease of nervous system, unspecified: Secondary | ICD-10-CM | POA: Diagnosis not present

## 2021-06-28 DIAGNOSIS — G4730 Sleep apnea, unspecified: Secondary | ICD-10-CM | POA: Diagnosis not present

## 2021-06-28 DIAGNOSIS — W19XXXA Unspecified fall, initial encounter: Secondary | ICD-10-CM | POA: Diagnosis not present

## 2021-06-28 DIAGNOSIS — I129 Hypertensive chronic kidney disease with stage 1 through stage 4 chronic kidney disease, or unspecified chronic kidney disease: Secondary | ICD-10-CM | POA: Diagnosis not present

## 2021-06-28 DIAGNOSIS — E785 Hyperlipidemia, unspecified: Secondary | ICD-10-CM | POA: Diagnosis not present

## 2021-06-28 DIAGNOSIS — Z7189 Other specified counseling: Secondary | ICD-10-CM | POA: Diagnosis not present

## 2021-06-28 DIAGNOSIS — E11649 Type 2 diabetes mellitus with hypoglycemia without coma: Secondary | ICD-10-CM | POA: Diagnosis not present

## 2021-06-28 DIAGNOSIS — N390 Urinary tract infection, site not specified: Secondary | ICD-10-CM | POA: Diagnosis not present

## 2021-06-28 DIAGNOSIS — D508 Other iron deficiency anemias: Secondary | ICD-10-CM | POA: Diagnosis not present

## 2021-06-28 DIAGNOSIS — J1282 Pneumonia due to coronavirus disease 2019: Secondary | ICD-10-CM | POA: Diagnosis not present

## 2021-06-28 DIAGNOSIS — R627 Adult failure to thrive: Secondary | ICD-10-CM | POA: Diagnosis not present

## 2021-06-28 DIAGNOSIS — I959 Hypotension, unspecified: Secondary | ICD-10-CM | POA: Diagnosis not present

## 2021-06-28 DIAGNOSIS — I10 Essential (primary) hypertension: Secondary | ICD-10-CM | POA: Diagnosis not present

## 2021-06-28 LAB — CBC WITH DIFFERENTIAL/PLATELET
Abs Immature Granulocytes: 0.07 10*3/uL (ref 0.00–0.07)
Basophils Absolute: 0 10*3/uL (ref 0.0–0.1)
Basophils Relative: 1 %
Eosinophils Absolute: 0.2 10*3/uL (ref 0.0–0.5)
Eosinophils Relative: 2 %
HCT: 32.5 % — ABNORMAL LOW (ref 36.0–46.0)
Hemoglobin: 9.6 g/dL — ABNORMAL LOW (ref 12.0–15.0)
Immature Granulocytes: 1 %
Lymphocytes Relative: 30 %
Lymphs Abs: 2.1 10*3/uL (ref 0.7–4.0)
MCH: 28.7 pg (ref 26.0–34.0)
MCHC: 29.5 g/dL — ABNORMAL LOW (ref 30.0–36.0)
MCV: 97 fL (ref 80.0–100.0)
Monocytes Absolute: 0.4 10*3/uL (ref 0.1–1.0)
Monocytes Relative: 5 %
Neutro Abs: 4.4 10*3/uL (ref 1.7–7.7)
Neutrophils Relative %: 61 %
Platelets: 228 10*3/uL (ref 150–400)
RBC: 3.35 MIL/uL — ABNORMAL LOW (ref 3.87–5.11)
RDW: 14 % (ref 11.5–15.5)
WBC: 7.1 10*3/uL (ref 4.0–10.5)
nRBC: 0 % (ref 0.0–0.2)

## 2021-06-28 LAB — BASIC METABOLIC PANEL
Anion gap: 7 (ref 5–15)
BUN: 22 mg/dL (ref 8–23)
CO2: 23 mmol/L (ref 22–32)
Calcium: 10.2 mg/dL (ref 8.9–10.3)
Chloride: 114 mmol/L — ABNORMAL HIGH (ref 98–111)
Creatinine, Ser: 1.17 mg/dL — ABNORMAL HIGH (ref 0.44–1.00)
GFR, Estimated: 51 mL/min — ABNORMAL LOW (ref >60)
Glucose, Bld: 86 mg/dL (ref 70–99)
Potassium: 4.8 mmol/L (ref 3.5–5.1)
Sodium: 144 mmol/L (ref 135–145)

## 2021-06-28 LAB — PHOSPHORUS: Phosphorus: 2.2 mg/dL — ABNORMAL LOW (ref 2.5–4.6)

## 2021-06-28 LAB — GLUCOSE, CAPILLARY: Glucose-Capillary: 86 mg/dL (ref 70–99)

## 2021-06-28 LAB — MAGNESIUM: Magnesium: 1.8 mg/dL (ref 1.7–2.4)

## 2021-06-28 MED ORDER — POTASSIUM & SODIUM PHOSPHATES 280-160-250 MG PO PACK: 1.0000 | PACK | Freq: Three times a day (TID) | ORAL | 0 refills | Status: AC

## 2021-06-28 MED ORDER — DIPHENHYDRAMINE HCL 25 MG PO CAPS: 25.0000 mg | ORAL_CAPSULE | Freq: Three times a day (TID) | ORAL | 0 refills | Status: DC | PRN

## 2021-06-28 MED ORDER — MAGNESIUM 250 MG PO TABS: 250.0000 mg | ORAL_TABLET | Freq: Two times a day (BID) | ORAL | 0 refills | Status: DC

## 2021-06-28 MED ORDER — FAMOTIDINE 20 MG PO TABS: 20.0000 mg | ORAL_TABLET | Freq: Two times a day (BID) | ORAL | 11 refills | Status: DC

## 2021-06-28 NOTE — Care Management Important Message (Signed)
Important Message  Patient Details  Name: Joanna [name] MRN: [id number] Date of Birth: June 21, 1951   Medicare Important Message Given:  Yes     Tommy Medal 06/28/2021, 11:28 AM

## 2021-06-28 NOTE — Plan of Care (Signed)

## 2021-06-28 NOTE — Progress Notes (Signed)
Second attempt to call report, no answer

## 2021-06-28 NOTE — Progress Notes (Signed)
Physical Therapy Treatment Patient Details Name: DYLANIE [name] MRN: [id number] DOB: [birth year] Today's Date: 06/28/2021   History of Present Illness Joanna [name] is a 70 y.o. female with medical history significant for hypertension, type 2 diabetes mellitus, CKD 4, idiopathic left upper extremity spastic hemiplegia, hypothyroidism and tobacco abuse who presents to the emergency department via EMS due to fall and confusion.  Patient was awake and alert, though she was only oriented to self.  She thought she was at home and she denies any history of fall.  Rest of the history was obtained from the ED physician and ED medical record.  Per report, patient lives alone, she is confused at baseline but without history of dementia.  Family member went to visit her today and found on the floor with a strong odor of urine.  She was presumed to have UTI and was sent to the ED for further evaluation and management.    PT Comments    Pt friendly and willing to participate with therapy today.  Bed mobility and transfer presents with slow labored movements, required min A for sidelying to sit due to Lt side weakness.  SPT complete to [facility].  Pt presents with slow short steps and reports fear of falling.  Used SPC through session to assist with balance.  Following restroom break pt able to stand to wash hands with no LOB.  EOS pt limited by fatigue.  Pt left in chair with call bell within reach and chair alarm set.  No reports of pain through session.     Recommendations for follow up therapy are one component of a multi-disciplinary discharge planning process, led by the attending physician.  Recommendations may be updated based on patient status, additional functional criteria and insurance authorization.  Follow Up Recommendations  SNF;Supervision for mobility/OOB;Supervision - Intermittent     Equipment Recommendations  None recommended by PT    Recommendations for Other Services       Precautions /  Restrictions Precautions Precautions: Fall Precaution Comments: left UE contracture     Mobility  Bed Mobility Overal bed mobility: Modified Independent Bed Mobility: Supine to Sit     Supine to sit: Min assist     General bed mobility comments: increased time, labored movement, required use of bed rail and Min assist for supine to sitting    Transfers Overall transfer level: Needs assistance Equipment used: Straight cane Transfers: Sit to/from Stand Sit to Stand: Min assist         General transfer comment: slow and cautious due to fear of falling, able to complete STS with use of Rt hand push from bed/BSC, use of SPC for balance assistance  Ambulation/Gait Ambulation/Gait assistance: Min assist Gait Distance (Feet): 20 Feet Assistive device: Straight cane Gait Pattern/deviations: Decreased step length - right;Decreased step length - left;Decreased stride length;Trunk flexed Gait velocity: decreased   General Gait Details: slow labored cadence with small steps due to weakness and fear of falling.  Limited to taking steps in room due to fatigue.   Stairs             Wheelchair Mobility    Modified Rankin (Stroke Patients Only)       Balance                                            Cognition Arousal/Alertness: Awake/alert Behavior During  Therapy: WFL for tasks assessed/performed                                          Exercises      General Comments        Pertinent Vitals/Pain Pain Assessment: No/denies pain    Home Living                      Prior Function            PT Goals (current goals can now be found in the care plan section)      Frequency    Min 3X/week      PT Plan      Co-evaluation              AM-PAC PT "6 Clicks" Mobility   Outcome Measure  Help needed turning from your back to your side while in a flat bed without using bedrails?: A Little Help needed  moving from lying on your back to sitting on the side of a flat bed without using bedrails?: A Little Help needed moving to and from a bed to a chair (including a wheelchair)?: A Little Help needed standing up from a chair using your arms (e.g., wheelchair or bedside chair)?: A Little Help needed to walk in hospital room?: A Lot Help needed climbing 3-5 steps with a railing? : A Lot 6 Click Score: 16    End of Session Equipment Utilized During Treatment: Gait belt Activity Tolerance: Patient tolerated treatment well;Patient limited by fatigue Patient left: in chair;with call bell/phone within reach;with chair alarm set Nurse Communication: Mobility status PT Visit Diagnosis: Unsteadiness on feet (R26.81);Other abnormalities of gait and mobility (R26.89);Muscle weakness (generalized) (M62.81)     Time: 3343-5686 PT Time Calculation (min) (ACUTE ONLY): 37 min  Charges:  $Therapeutic Activity: 23-37 mins                    Ihor Austin, LPTA/CLT; CBIS [phone]   Aldona Lento 06/28/2021, 10:59 AM

## 2021-06-28 NOTE — Discharge Summary (Signed)
Physician Discharge Summary  LAVELL RIDINGS ZWC:[id number] DOB: [birth year] DOA: 06/24/2021  PCP: [name], MD  Admit date: 06/24/2021 Discharge date: 06/28/2021  Admitted From: Home Disposition: Skilled nursing facility  Recommendations for Outpatient Follow-up:  Follow up with PCP in 1-2 weeks Please obtain BMP/CBC/magnesium/phosphorus in one week and replace as needed.   Home Health: N/A Equipment/Devices: N/A  Discharge Condition: Stable CODE STATUS: Full code Diet recommendation: Low-salt diet.  Nutritional supplements.  Discharge summary: 70 year old female with history of hypertension, type 2 diabetes not on treatment, CKD stage IV, idiopathic left upper extremity spastic hemiplegia, hypothyroidism presented to the emergency room with confusion and fall at home.  She was found on the floor by her family member visiting her.  She lives at home by herself.  Patient thinks she was working and cleaning house, got tired and could not get up.  In the emergency room, she was hypernatremic, trauma series was negative.  Urinalysis with suspected UTI.  Multiple electrolyte abnormalities.  Admitted and treated for electrolyte abnormalities, acute kidney injury and suspected UTI.     Assessment & plan of care:   Acute renal failure and hypernatremia superimposed on chronic kidney disease stage IV: Multifactorial.  Probably poor oral intake with ongoing use of losartan. Treated with isotonic fluid with clinical improvement. Renal functions improving to her baseline. Encourage oral intake.  Currently with adequate intake. No evidence of urinary obstruction.  Baseline creatinine 1.2-1.8.   Suspected acute UTI present on admission: Cultures negative.  UTI ruled out.  Rocephin completed for 3 days.  No indication to continue to treat.  Acute metabolic encephalopathy: Multifactorial.  Multiple electrolyte abnormalities and medications probably contributing.  Mental status improving.  CT head  nonfocal.  Electrolytes: Hypomagnesemia: Magnesium 0.8 on presentation.  Chronic hypomagnesemia.  Replaced aggressively and normalized.  We will keep on a scheduled oral replacement.  Change PPI to Pepcid. Hypokalemia: Chronic and persistent.  Normalized today.  Replace aggressively and keep on a schedule replacement.   Sleep apnea: Does not use CPAP.  No need to use as she does not like it.  Essential hypertension: Blood pressure is stabilized now.  Resume home dose of losartan.  Hypothyroidism: On Synthroid that she will continue.  Type 2 diabetes: Diet controlled.  No indication to treat at this time.    GERD: On omeprazole at home.  PPI associated with impairment of magnesium absorption.  Changed to famotidine.  Patient is medically stable.  Transfer to skilled nursing rehab today to continue inpatient therapies before going home.   Discharge Diagnoses:  Principal Problem:   Acute renal failure superimposed on stage 4 chronic kidney disease ([place]) Active Problems:   Fall at home, initial encounter   Hypernatremia    Discharge Instructions  Discharge Instructions     Diet - low sodium heart healthy   Complete by: As directed    Increase activity slowly   Complete by: As directed       Allergies as of 06/28/2021       Reactions   Zoloft [sertraline Hcl] Other (See Comments)   Withdrawn,bad dreams   Ivp Dye [iodinated Diagnostic Agents] Rash        Medication List     STOP taking these medications    omeprazole 40 MG capsule Commonly known as: PRILOSEC       TAKE these medications    acetaminophen 500 MG tablet Commonly known as: TYLENOL Take 1,000 mg by mouth every 8 (eight) hours as needed  for moderate pain.   albuterol 108 (90 Base) MCG/ACT inhaler Commonly known as: VENTOLIN HFA Inhale 2 puffs into the lungs every 4 (four) hours as needed for wheezing or shortness of breath.   diphenhydrAMINE 25 mg capsule Commonly known as: BENADRYL Take 1  capsule (25 mg total) by mouth every 8 (eight) hours as needed for itching or allergies.   famotidine 20 MG tablet Commonly known as: PEPCID Take 1 tablet (20 mg total) by mouth 2 (two) times daily.   ferrous sulfate 325 (65 FE) MG tablet Take 325 mg by mouth daily with breakfast.   fexofenadine 180 MG tablet Commonly known as: ALLEGRA Take 180 mg by mouth daily as needed for allergies.   folic acid 1 MG tablet Commonly known as: FOLVITE TAKE 1 TABLET(1 MG) BY MOUTH DAILY What changed: See the new instructions.   indapamide 1.25 MG tablet Commonly known as: LOZOL Take 1.25 mg by mouth daily.   levothyroxine 88 MCG tablet Commonly known as: SYNTHROID Take 1 tablet (88 mcg total) by mouth daily before breakfast.   losartan 100 MG tablet Commonly known as: COZAAR Take 100 mg by mouth daily.   Magnesium 250 MG Tabs Take 1 tablet (250 mg total) by mouth 2 (two) times daily. What changed: when to take this   potassium & sodium phosphates 280-160-250 MG Pack Commonly known as: PHOS-NAK Take 1 packet by mouth 4 (four) times daily -  with meals and at bedtime for 7 days.   potassium chloride 10 MEQ tablet Commonly known as: KLOR-CON Take 3 tablets po each morning and 3 tablet po each evening What changed:  how much to take how to take this when to take this additional instructions   rosuvastatin 20 MG tablet Commonly known as: CRESTOR Take 1 tablet by mouth daily (Stop Pravastatin) What changed:  how much to take how to take this when to take this additional instructions   topiramate 50 MG tablet Commonly known as: Topamax Take 1 tablet (50 mg total) by mouth 2 (two) times daily.   traZODone 50 MG tablet Commonly known as: DESYREL TAKE 1/2 TO 1 TABLET BY MOUTH EVERY NIGHT FOR SLEEP What changed:  how much to take how to take this when to take this additional instructions   VITAMIN B-12 PO Take 1 tablet by mouth daily.        Contact information for  after-discharge care     Destination     [facility] Preferred SNF .   Service: Skilled Nursing Contact information: [place] [phone]                    Allergies  Allergen Reactions   Zoloft [Sertraline Hcl] Other (See Comments)    Withdrawn,bad dreams   Ivp Dye [Iodinated Diagnostic Agents] Rash    Consultations: None   Procedures/Studies: DG Chest 2 View  Result Date: 06/24/2021 CLINICAL DATA:  Altered mental status EXAM: CHEST - 2 VIEW COMPARISON:  08/13/2017 FINDINGS: Lungs are well expanded, symmetric, and clear. No pneumothorax or pleural effusion. Cardiac size within normal limits. Pulmonary vascularity is normal. Osseous structures are age-appropriate. No acute bone abnormality. IMPRESSION: No active cardiopulmonary disease. Electronically Signed   By: Fidela [place] M.D.   On: 06/24/2021 20:54   CT HEAD WO CONTRAST (5MM)  Result Date: 06/25/2021 CLINICAL DATA:  Altered mental status. EXAM: CT HEAD WITHOUT CONTRAST TECHNIQUE: Contiguous axial images were obtained from the base of the  skull through the vertex without intravenous contrast. COMPARISON:  Head CT dated 04/19/2021. FINDINGS: Brain: Mild age-related atrophy and chronic microvascular ischemic changes. There is no acute intracranial hemorrhage. No mass effect or midline shift. No extra-axial fluid collection. Vascular: No hyperdense vessel or unexpected calcification. Skull: Normal. Negative for fracture or focal lesion. Sinuses/Orbits: No acute finding. Other: There is apparent diffuse scalp edema. IMPRESSION: 1. No acute intracranial pathology. 2. Mild age-related atrophy and chronic microvascular ischemic changes. Electronically Signed   By: Anner Crete M.D.   On: 06/25/2021 00:52   (Echo, Carotid, EGD, Colonoscopy, ERCP)    Subjective: Patient seen and examined.  No overnight events.  She decided that she will go to a skilled  rehab.  Denies any nausea vomiting.  Appetite is fair.   Discharge Exam: Vitals:   06/27/21 2035 06/28/21 0525  BP: (!) 164/108 (!) 146/103  Pulse: 84 93  Resp: 18 19  Temp: (!) 97.5 F (36.4 C) 98 F (36.7 C)  SpO2: 100% 100%   Vitals:   06/27/21 0439 06/27/21 1317 06/27/21 2035 06/28/21 0525  BP: (!) 137/98 135/89 (!) 164/108 (!) 146/103  Pulse: 82 96 84 93  Resp: 19 18 18 19   Temp: 98.2 F (36.8 C) 98.2 F (36.8 C) (!) 97.5 F (36.4 C) 98 F (36.7 C)  TempSrc: Oral Oral Oral Oral  SpO2: 99% 100% 100% 100%  Weight:      Height:        General: Pt is alert, awake, not in acute distress Frail and debilitated.  Not in any distress. Cardiovascular: RRR, S1/S2 +, no rubs, no gallops Respiratory: CTA bilaterally, no wheezing, no rhonchi Abdominal: Soft, NT, ND, bowel sounds + Extremities: no edema, no cyanosis Patient does have left upper extremity with spastic paralysis and flexion contracture at elbow.    The results of significant diagnostics from this hospitalization (including imaging, microbiology, ancillary and laboratory) are listed below for reference.     Microbiology: Recent Results (from the past 240 hour(s))  Blood culture (routine x 2)     Status: None (Preliminary result)   Collection Time: 06/24/21  8:15 PM   Specimen: BLOOD RIGHT WRIST  Result Value Ref Range Status   Specimen Description BLOOD RIGHT WRIST  Final   Special Requests   Final    BOTTLES DRAWN AEROBIC AND ANAEROBIC Blood Culture adequate volume   Culture   Final    NO GROWTH 4 DAYS Performed at [facility], [address] [place], [place] [address]    Report Status PENDING  Incomplete  Blood culture (routine x 2)     Status: None (Preliminary result)   Collection Time: 06/24/21  8:21 PM   Specimen: Right Antecubital; Blood  Result Value Ref Range Status   Specimen Description RIGHT ANTECUBITAL  Final   Special Requests   Final    BOTTLES DRAWN AEROBIC AND ANAEROBIC Blood  Culture adequate volume   Culture   Final    NO GROWTH 4 DAYS Performed at [facility], [address] [place], [place] [address]    Report Status PENDING  Incomplete  Resp Panel by RT-PCR (Flu A&B, Covid) Nasopharyngeal Swab     Status: None   Collection Time: 06/24/21  9:05 PM   Specimen: Nasopharyngeal Swab; Nasopharyngeal(NP) swabs in vial transport medium  Result Value Ref Range Status   SARS Coronavirus 2 by RT PCR NEGATIVE NEGATIVE Final    Comment: (NOTE) SARS-CoV-2 target nucleic acids are NOT DETECTED.  The  SARS-CoV-2 RNA is generally detectable in upper respiratory specimens during the acute phase of infection. The lowest concentration of SARS-CoV-2 viral copies this assay can detect is 138 copies/mL. A negative result does not preclude SARS-Cov-2 infection and should not be used as the sole basis for treatment or other patient management decisions. A negative result may occur with  improper specimen collection/handling, submission of specimen other than nasopharyngeal swab, presence of viral mutation(s) within the areas targeted by this assay, and inadequate number of viral copies(<138 copies/mL). A negative result must be combined with clinical observations, patient history, and epidemiological information. The expected result is Negative.  Fact Sheet for Patients:  EntrepreneurPulse.com.au  Fact Sheet for Healthcare Providers:  IncredibleEmployment.be  This test is no t yet approved or cleared by the [place] FDA and  has been authorized for detection and/or diagnosis of SARS-CoV-2 by FDA under an Emergency Use Authorization (EUA). This EUA will remain  in effect (meaning this test can be used) for the duration of the COVID-19 declaration under Section 564(b)(1) of the Act, 21 U.S.C.section 360bbb-3(b)(1), unless the authorization is terminated  or revoked sooner.       Influenza A by PCR NEGATIVE NEGATIVE Final    Influenza B by PCR NEGATIVE NEGATIVE Final    Comment: (NOTE) The Xpert Xpress SARS-CoV-2/FLU/RSV plus assay is intended as an aid in the diagnosis of influenza from Nasopharyngeal swab specimens and should not be used as a sole basis for treatment. Nasal washings and aspirates are unacceptable for Xpert Xpress SARS-CoV-2/FLU/RSV testing.  Fact Sheet for Patients: EntrepreneurPulse.com.au  Fact Sheet for Healthcare Providers: IncredibleEmployment.be  This test is not yet approved or cleared by the [place] FDA and has been authorized for detection and/or diagnosis of SARS-CoV-2 by FDA under an Emergency Use Authorization (EUA). This EUA will remain in effect (meaning this test can be used) for the duration of the COVID-19 declaration under Section 564(b)(1) of the Act, 21 U.S.C. section 360bbb-3(b)(1), unless the authorization is terminated or revoked.  Performed at [facility], [address], [place], [place] [address]   Urine Culture     Status: None   Collection Time: 06/24/21  9:39 PM   Specimen: Urine, Catheterized  Result Value Ref Range Status   Specimen Description   Final    URINE, CATHETERIZED Performed at [facility], [address] [place], [place] [address]    Special Requests   Final    NONE Performed at [facility], [address], [place], [place] [address]    Culture   Final    NO GROWTH Performed at [facility], [place] [address], [place], [place] [address]    Report Status 06/26/2021 FINAL  Final     Labs: BNP (last 3 results) No results for input(s): BNP in the last 8760 hours. Basic Metabolic Panel: Recent Labs  Lab 06/25/21 0446 06/25/21 0909 06/26/21 0449 06/27/21 0727 06/28/21 0618  NA 149* 148* 141 144 144  K 3.2* 3.2* 2.7* 4.0 4.8  CL 117* 118* 112* 113* 114*  CO2 15* 17* 20* 23 23  GLUCOSE 95 89 157* 93 86  BUN 64* 61* 52* 30* 22  CREATININE 1.74* 1.67* 1.45* 1.25* 1.17*  CALCIUM 7.7*  7.6* 8.4* 9.6 10.2  MG 0.8*  --  1.8 2.0 1.8  PHOS 3.1  --   --   --  2.2*   Liver Function Tests: Recent Labs  Lab 06/24/21 1925 06/25/21 0446 06/26/21 0449  AST 115* 90*  66*  ALT 42 36 38  ALKPHOS 72 59 55  BILITOT 0.8 0.7 0.3  PROT 7.2 5.8* 5.3*  ALBUMIN 3.8 3.1* 2.8*   No results for input(s): LIPASE, AMYLASE in the last 168 hours. No results for input(s): AMMONIA in the last 168 hours. CBC: Recent Labs  Lab 06/24/21 1925 06/25/21 0446 06/26/21 0449 06/27/21 0727 06/28/21 0618  WBC 17.7* 16.8* 11.6* 7.1 7.1  NEUTROABS 15.7*  --   --  5.0 4.4  HGB 11.6* 9.6* 9.2* 8.5* 9.6*  HCT 36.8 30.6* 29.7* 27.3* 32.5*  MCV 94.4 93.9 93.4 93.5 97.0  PLT 297 260 241 212 228   Cardiac Enzymes: No results for input(s): CKTOTAL, CKMB, CKMBINDEX, TROPONINI in the last 168 hours. BNP: Invalid input(s): POCBNP CBG: Recent Labs  Lab 06/27/21 0726 06/27/21 1110 06/27/21 1619 06/27/21 2027 06/28/21 0716  GLUCAP 93 89 83 107* 86   D-Dimer No results for input(s): DDIMER in the last 72 hours. Hgb A1c Recent Labs    06/26/21 0449  HGBA1C 5.1   Lipid Profile No results for input(s): CHOL, HDL, LDLCALC, TRIG, CHOLHDL, LDLDIRECT in the last 72 hours. Thyroid function studies No results for input(s): TSH, T4TOTAL, T3FREE, THYROIDAB in the last 72 hours.  Invalid input(s): FREET3 Anemia work up No results for input(s): VITAMINB12, FOLATE, FERRITIN, TIBC, IRON, RETICCTPCT in the last 72 hours. Urinalysis    Component Value Date/Time   COLORURINE YELLOW 06/24/2021 2139   APPEARANCEUR CLEAR 06/24/2021 2139   LABSPEC >1.030 (H) 06/24/2021 2139   PHURINE 5.5 06/24/2021 2139   GLUCOSEU NEGATIVE 06/24/2021 2139   HGBUR LARGE (A) 06/24/2021 2139   BILIRUBINUR MODERATE (A) 06/24/2021 2139   KETONESUR 15 (A) 06/24/2021 2139   PROTEINUR 100 (A) 06/24/2021 2139   NITRITE NEGATIVE 06/24/2021 2139   LEUKOCYTESUR NEGATIVE 06/24/2021 2139   Sepsis Labs Invalid input(s):  PROCALCITONIN,  WBC,  LACTICIDVEN Microbiology Recent Results (from the past 240 hour(s))  Blood culture (routine x 2)     Status: None (Preliminary result)   Collection Time: 06/24/21  8:15 PM   Specimen: BLOOD RIGHT WRIST  Result Value Ref Range Status   Specimen Description BLOOD RIGHT WRIST  Final   Special Requests   Final    BOTTLES DRAWN AEROBIC AND ANAEROBIC Blood Culture adequate volume   Culture   Final    NO GROWTH 4 DAYS Performed at [facility], [address], [place], [place] [address]    Report Status PENDING  Incomplete  Blood culture (routine x 2)     Status: None (Preliminary result)   Collection Time: 06/24/21  8:21 PM   Specimen: Right Antecubital; Blood  Result Value Ref Range Status   Specimen Description RIGHT ANTECUBITAL  Final   Special Requests   Final    BOTTLES DRAWN AEROBIC AND ANAEROBIC Blood Culture adequate volume   Culture   Final    NO GROWTH 4 DAYS Performed at [facility], [address] [place], [place] [address]    Report Status PENDING  Incomplete  Resp Panel by RT-PCR (Flu A&B, Covid) Nasopharyngeal Swab     Status: None   Collection Time: 06/24/21  9:05 PM   Specimen: Nasopharyngeal Swab; Nasopharyngeal(NP) swabs in vial transport medium  Result Value Ref Range Status   SARS Coronavirus 2 by RT PCR NEGATIVE NEGATIVE Final    Comment: (NOTE) SARS-CoV-2 target nucleic acids are NOT DETECTED.  The SARS-CoV-2 RNA is generally detectable in upper respiratory specimens during the acute phase of  infection. The lowest concentration of SARS-CoV-2 viral copies this assay can detect is 138 copies/mL. A negative result does not preclude SARS-Cov-2 infection and should not be used as the sole basis for treatment or other patient management decisions. A negative result may occur with  improper specimen collection/handling, submission of specimen other than nasopharyngeal swab, presence of viral mutation(s) within the areas targeted by this  assay, and inadequate number of viral copies(<138 copies/mL). A negative result must be combined with clinical observations, patient history, and epidemiological information. The expected result is Negative.  Fact Sheet for Patients:  EntrepreneurPulse.com.au  Fact Sheet for Healthcare Providers:  IncredibleEmployment.be  This test is no t yet approved or cleared by the [place] FDA and  has been authorized for detection and/or diagnosis of SARS-CoV-2 by FDA under an Emergency Use Authorization (EUA). This EUA will remain  in effect (meaning this test can be used) for the duration of the COVID-19 declaration under Section 564(b)(1) of the Act, 21 U.S.C.section 360bbb-3(b)(1), unless the authorization is terminated  or revoked sooner.       Influenza A by PCR NEGATIVE NEGATIVE Final   Influenza B by PCR NEGATIVE NEGATIVE Final    Comment: (NOTE) The Xpert Xpress SARS-CoV-2/FLU/RSV plus assay is intended as an aid in the diagnosis of influenza from Nasopharyngeal swab specimens and should not be used as a sole basis for treatment. Nasal washings and aspirates are unacceptable for Xpert Xpress SARS-CoV-2/FLU/RSV testing.  Fact Sheet for Patients: EntrepreneurPulse.com.au  Fact Sheet for Healthcare Providers: IncredibleEmployment.be  This test is not yet approved or cleared by the [place] FDA and has been authorized for detection and/or diagnosis of SARS-CoV-2 by FDA under an Emergency Use Authorization (EUA). This EUA will remain in effect (meaning this test can be used) for the duration of the COVID-19 declaration under Section 564(b)(1) of the Act, 21 U.S.C. section 360bbb-3(b)(1), unless the authorization is terminated or revoked.  Performed at [facility], [address], [place], [place] [address]   Urine Culture     Status: None   Collection Time: 06/24/21  9:39 PM   Specimen: Urine,  Catheterized  Result Value Ref Range Status   Specimen Description   Final    URINE, CATHETERIZED Performed at [facility], [address], [place], [place] [address]    Special Requests   Final    NONE Performed at [facility], [address], [place], [place] [address]    Culture   Final    NO GROWTH Performed at [facility], [place] [address] [place],  [address]    Report Status 06/26/2021 FINAL  Final     Time coordinating discharge:  32 minutes  SIGNED:   Barb Merino, MD  [facility] 06/28/2021, 10:48 AM

## 2021-06-28 NOTE — Progress Notes (Signed)
Third attempt to call report to [facility]. No answer.

## 2021-06-28 NOTE — Progress Notes (Signed)
Attempted to call report, no answer at this time.

## 2021-06-28 NOTE — TOC Transition Note (Signed)
Transition of Care [facility]) - CM/SW Discharge Note   Patient Details  Name: Joanna [name] MRN: [id number] Date of Birth: 01-31-1951  [facility]) CM/SW Contact:  Salome Arnt, LCSW Phone Number: 06/28/2021, 10:55 AM   Clinical Narrative:  Pt d/c today. She states she has reconsidered SNF. Pt accepted bed offer at [facility] and auth received [phone] for 7 days). Janie at [facility] aware COVID negative on 06/24/21. RN given number to call report. D/C summary sent to SNF. Pt requests LCSW call friend, [name] to transport. [name] is agreeable and aware pt must arrive by 3:00 for pt to sign in.      Final next level of care: [place] Barriers to Discharge: Barriers Resolved   Patient Goals and CMS Choice Patient states their goals for this hospitalization and ongoing recovery are:: Home with [facility] CMS Medicare.gov Compare Post Acute Care list provided to:: Patient Choice offered to / list presented to : Patient  Discharge Placement              Patient chooses bed at: [facility] Patient to be transferred to facility by: Friend Name of family member notified: [name], friend Patient and family notified of of transfer: 06/28/21  Discharge Plan and Services In-house Referral: Clinical Social Work Discharge Planning Services: CM Consult Post Acute Care Choice: Home Health                               Social Determinants of Health (SDOH) Interventions     Readmission Risk Interventions Readmission Risk Prevention Plan 06/27/2021  Transportation Screening Complete  HRI or Home Care Consult Complete  Social Work Consult for [place] Planning/Counseling Complete  Palliative Care Screening Not Applicable  Medication Review Press photographer) Complete  Some recent data might be hidden

## 2021-06-29 ENCOUNTER — Encounter: Payer: Self-pay | Admitting: Family Medicine

## 2021-06-29 LAB — CULTURE, BLOOD (ROUTINE X 2)
Culture: NO GROWTH
Culture: NO GROWTH
Special Requests: ADEQUATE
Special Requests: ADEQUATE

## 2021-06-30 DIAGNOSIS — G8194 Hemiplegia, unspecified affecting left nondominant side: Secondary | ICD-10-CM | POA: Diagnosis not present

## 2021-06-30 DIAGNOSIS — E039 Hypothyroidism, unspecified: Secondary | ICD-10-CM | POA: Diagnosis not present

## 2021-06-30 DIAGNOSIS — E785 Hyperlipidemia, unspecified: Secondary | ICD-10-CM | POA: Diagnosis not present

## 2021-06-30 DIAGNOSIS — G479 Sleep disorder, unspecified: Secondary | ICD-10-CM | POA: Diagnosis not present

## 2021-06-30 DIAGNOSIS — D649 Anemia, unspecified: Secondary | ICD-10-CM | POA: Diagnosis not present

## 2021-06-30 DIAGNOSIS — E876 Hypokalemia: Secondary | ICD-10-CM | POA: Diagnosis not present

## 2021-06-30 DIAGNOSIS — I10 Essential (primary) hypertension: Secondary | ICD-10-CM | POA: Diagnosis not present

## 2021-06-30 DIAGNOSIS — E119 Type 2 diabetes mellitus without complications: Secondary | ICD-10-CM | POA: Diagnosis not present

## 2021-06-30 DIAGNOSIS — E870 Hyperosmolality and hypernatremia: Secondary | ICD-10-CM | POA: Diagnosis not present

## 2021-06-30 DIAGNOSIS — G4730 Sleep apnea, unspecified: Secondary | ICD-10-CM | POA: Diagnosis not present

## 2021-06-30 DIAGNOSIS — G9341 Metabolic encephalopathy: Secondary | ICD-10-CM | POA: Diagnosis not present

## 2021-07-04 DIAGNOSIS — N390 Urinary tract infection, site not specified: Secondary | ICD-10-CM | POA: Diagnosis not present

## 2021-07-04 DIAGNOSIS — G8194 Hemiplegia, unspecified affecting left nondominant side: Secondary | ICD-10-CM | POA: Diagnosis not present

## 2021-07-04 DIAGNOSIS — I10 Essential (primary) hypertension: Secondary | ICD-10-CM | POA: Diagnosis not present

## 2021-07-06 ENCOUNTER — Telehealth: Payer: Self-pay | Admitting: Family Medicine

## 2021-07-06 NOTE — Telephone Encounter (Signed)
Joanna Drown, MD  [name], LPN Recommend scheduling for hospital follow-up somewhere within the next 8 days please        Front will contact patient to have her follow up

## 2021-07-06 NOTE — Telephone Encounter (Signed)
error 

## 2021-07-07 ENCOUNTER — Emergency Department (HOSPITAL_COMMUNITY): Payer: PPO

## 2021-07-07 ENCOUNTER — Observation Stay (HOSPITAL_COMMUNITY): Payer: PPO

## 2021-07-07 ENCOUNTER — Other Ambulatory Visit: Payer: Self-pay

## 2021-07-07 ENCOUNTER — Inpatient Hospital Stay (HOSPITAL_COMMUNITY)
Admission: EM | Admit: 2021-07-07 | Discharge: 2021-07-25 | DRG: 177 | Disposition: A | Discharge status: Skilled Nursing Facility | Payer: PPO | Source: Skilled Nursing Facility | Attending: Family Medicine | Admitting: Family Medicine

## 2021-07-07 DIAGNOSIS — E785 Hyperlipidemia, unspecified: Secondary | ICD-10-CM | POA: Diagnosis present

## 2021-07-07 DIAGNOSIS — M4804 Spinal stenosis, thoracic region: Secondary | ICD-10-CM | POA: Diagnosis not present

## 2021-07-07 DIAGNOSIS — N179 Acute kidney failure, unspecified: Secondary | ICD-10-CM | POA: Diagnosis present

## 2021-07-07 DIAGNOSIS — Z8051 Family history of malignant neoplasm of kidney: Secondary | ICD-10-CM

## 2021-07-07 DIAGNOSIS — J159 Unspecified bacterial pneumonia: Secondary | ICD-10-CM | POA: Diagnosis present

## 2021-07-07 DIAGNOSIS — R531 Weakness: Secondary | ICD-10-CM | POA: Diagnosis not present

## 2021-07-07 DIAGNOSIS — G4700 Insomnia, unspecified: Secondary | ICD-10-CM | POA: Diagnosis present

## 2021-07-07 DIAGNOSIS — W19XXXA Unspecified fall, initial encounter: Secondary | ICD-10-CM | POA: Diagnosis not present

## 2021-07-07 DIAGNOSIS — Z888 Allergy status to other drugs, medicaments and biological substances status: Secondary | ICD-10-CM | POA: Diagnosis not present

## 2021-07-07 DIAGNOSIS — M5126 Other intervertebral disc displacement, lumbar region: Secondary | ICD-10-CM | POA: Diagnosis not present

## 2021-07-07 DIAGNOSIS — Z83438 Family history of other disorder of lipoprotein metabolism and other lipidemia: Secondary | ICD-10-CM

## 2021-07-07 DIAGNOSIS — E11649 Type 2 diabetes mellitus with hypoglycemia without coma: Secondary | ICD-10-CM | POA: Diagnosis present

## 2021-07-07 DIAGNOSIS — K219 Gastro-esophageal reflux disease without esophagitis: Secondary | ICD-10-CM | POA: Diagnosis present

## 2021-07-07 DIAGNOSIS — D509 Iron deficiency anemia, unspecified: Secondary | ICD-10-CM | POA: Diagnosis present

## 2021-07-07 DIAGNOSIS — G8194 Hemiplegia, unspecified affecting left nondominant side: Secondary | ICD-10-CM | POA: Diagnosis not present

## 2021-07-07 DIAGNOSIS — I959 Hypotension, unspecified: Secondary | ICD-10-CM | POA: Diagnosis not present

## 2021-07-07 DIAGNOSIS — M24542 Contracture, left hand: Secondary | ICD-10-CM | POA: Diagnosis present

## 2021-07-07 DIAGNOSIS — R32 Unspecified urinary incontinence: Secondary | ICD-10-CM | POA: Diagnosis present

## 2021-07-07 DIAGNOSIS — I700 Atherosclerosis of aorta: Secondary | ICD-10-CM | POA: Diagnosis not present

## 2021-07-07 DIAGNOSIS — G8114 Spastic hemiplegia affecting left nondominant side: Secondary | ICD-10-CM

## 2021-07-07 DIAGNOSIS — E039 Hypothyroidism, unspecified: Secondary | ICD-10-CM | POA: Diagnosis present

## 2021-07-07 DIAGNOSIS — R6510 Systemic inflammatory response syndrome (SIRS) of non-infectious origin without acute organ dysfunction: Secondary | ICD-10-CM

## 2021-07-07 DIAGNOSIS — Z91041 Radiographic dye allergy status: Secondary | ICD-10-CM | POA: Diagnosis not present

## 2021-07-07 DIAGNOSIS — E42 Marasmic kwashiorkor: Secondary | ICD-10-CM | POA: Diagnosis not present

## 2021-07-07 DIAGNOSIS — E43 Unspecified severe protein-calorie malnutrition: Secondary | ICD-10-CM | POA: Insufficient documentation

## 2021-07-07 DIAGNOSIS — Z6820 Body mass index (BMI) 20.0-20.9, adult: Secondary | ICD-10-CM

## 2021-07-07 DIAGNOSIS — M5124 Other intervertebral disc displacement, thoracic region: Secondary | ICD-10-CM | POA: Diagnosis not present

## 2021-07-07 DIAGNOSIS — M4802 Spinal stenosis, cervical region: Secondary | ICD-10-CM | POA: Diagnosis not present

## 2021-07-07 DIAGNOSIS — E870 Hyperosmolality and hypernatremia: Secondary | ICD-10-CM | POA: Diagnosis not present

## 2021-07-07 DIAGNOSIS — M6281 Muscle weakness (generalized): Secondary | ICD-10-CM | POA: Diagnosis present

## 2021-07-07 DIAGNOSIS — Z833 Family history of diabetes mellitus: Secondary | ICD-10-CM

## 2021-07-07 DIAGNOSIS — I129 Hypertensive chronic kidney disease with stage 1 through stage 4 chronic kidney disease, or unspecified chronic kidney disease: Secondary | ICD-10-CM | POA: Diagnosis present

## 2021-07-07 DIAGNOSIS — R1312 Dysphagia, oropharyngeal phase: Secondary | ICD-10-CM | POA: Diagnosis not present

## 2021-07-07 DIAGNOSIS — R627 Adult failure to thrive: Secondary | ICD-10-CM | POA: Diagnosis present

## 2021-07-07 DIAGNOSIS — I10 Essential (primary) hypertension: Secondary | ICD-10-CM | POA: Diagnosis not present

## 2021-07-07 DIAGNOSIS — U071 COVID-19: Principal | ICD-10-CM

## 2021-07-07 DIAGNOSIS — J1282 Pneumonia due to coronavirus disease 2019: Secondary | ICD-10-CM | POA: Diagnosis present

## 2021-07-07 DIAGNOSIS — Z515 Encounter for palliative care: Secondary | ICD-10-CM | POA: Diagnosis not present

## 2021-07-07 DIAGNOSIS — R000 Tachycardia, unspecified: Secondary | ICD-10-CM | POA: Diagnosis not present

## 2021-07-07 DIAGNOSIS — Z823 Family history of stroke: Secondary | ICD-10-CM

## 2021-07-07 DIAGNOSIS — Z7189 Other specified counseling: Secondary | ICD-10-CM | POA: Diagnosis not present

## 2021-07-07 DIAGNOSIS — E1122 Type 2 diabetes mellitus with diabetic chronic kidney disease: Secondary | ICD-10-CM | POA: Diagnosis present

## 2021-07-07 DIAGNOSIS — Z66 Do not resuscitate: Secondary | ICD-10-CM | POA: Diagnosis present

## 2021-07-07 DIAGNOSIS — Z743 Need for continuous supervision: Secondary | ICD-10-CM | POA: Diagnosis not present

## 2021-07-07 DIAGNOSIS — Z79890 Hormone replacement therapy (postmenopausal): Secondary | ICD-10-CM

## 2021-07-07 DIAGNOSIS — R279 Unspecified lack of coordination: Secondary | ICD-10-CM | POA: Diagnosis not present

## 2021-07-07 DIAGNOSIS — R2681 Unsteadiness on feet: Secondary | ICD-10-CM | POA: Diagnosis not present

## 2021-07-07 DIAGNOSIS — R4182 Altered mental status, unspecified: Secondary | ICD-10-CM | POA: Diagnosis not present

## 2021-07-07 DIAGNOSIS — R41841 Cognitive communication deficit: Secondary | ICD-10-CM | POA: Diagnosis not present

## 2021-07-07 DIAGNOSIS — N1832 Chronic kidney disease, stage 3b: Secondary | ICD-10-CM | POA: Diagnosis present

## 2021-07-07 DIAGNOSIS — J984 Other disorders of lung: Secondary | ICD-10-CM | POA: Diagnosis not present

## 2021-07-07 DIAGNOSIS — Z741 Need for assistance with personal care: Secondary | ICD-10-CM | POA: Diagnosis not present

## 2021-07-07 DIAGNOSIS — E869 Volume depletion, unspecified: Secondary | ICD-10-CM | POA: Diagnosis not present

## 2021-07-07 DIAGNOSIS — D508 Other iron deficiency anemias: Secondary | ICD-10-CM | POA: Diagnosis not present

## 2021-07-07 DIAGNOSIS — W19XXXD Unspecified fall, subsequent encounter: Secondary | ICD-10-CM | POA: Diagnosis not present

## 2021-07-07 DIAGNOSIS — E860 Dehydration: Secondary | ICD-10-CM | POA: Diagnosis present

## 2021-07-07 DIAGNOSIS — G9341 Metabolic encephalopathy: Secondary | ICD-10-CM | POA: Diagnosis present

## 2021-07-07 DIAGNOSIS — R262 Difficulty in walking, not elsewhere classified: Secondary | ICD-10-CM | POA: Diagnosis not present

## 2021-07-07 DIAGNOSIS — G319 Degenerative disease of nervous system, unspecified: Secondary | ICD-10-CM | POA: Diagnosis present

## 2021-07-07 DIAGNOSIS — Z79899 Other long term (current) drug therapy: Secondary | ICD-10-CM | POA: Diagnosis not present

## 2021-07-07 DIAGNOSIS — Z8249 Family history of ischemic heart disease and other diseases of the circulatory system: Secondary | ICD-10-CM

## 2021-07-07 DIAGNOSIS — F17210 Nicotine dependence, cigarettes, uncomplicated: Secondary | ICD-10-CM | POA: Diagnosis present

## 2021-07-07 DIAGNOSIS — N390 Urinary tract infection, site not specified: Secondary | ICD-10-CM | POA: Diagnosis not present

## 2021-07-07 DIAGNOSIS — R55 Syncope and collapse: Secondary | ICD-10-CM | POA: Diagnosis present

## 2021-07-07 LAB — CBC
HCT: 33.7 % — ABNORMAL LOW (ref 36.0–46.0)
Hemoglobin: 10.3 g/dL — ABNORMAL LOW (ref 12.0–15.0)
MCH: 29 pg (ref 26.0–34.0)
MCHC: 30.6 g/dL (ref 30.0–36.0)
MCV: 94.9 fL (ref 80.0–100.0)
Platelets: 274 10*3/uL (ref 150–400)
RBC: 3.55 MIL/uL — ABNORMAL LOW (ref 3.87–5.11)
RDW: 13.5 % (ref 11.5–15.5)
WBC: 8.8 10*3/uL (ref 4.0–10.5)
nRBC: 0 % (ref 0.0–0.2)

## 2021-07-07 LAB — I-STAT VENOUS BLOOD GAS, ED
Acid-Base Excess: 1 mmol/L (ref 0.0–2.0)
Bicarbonate: 24.8 mmol/L (ref 20.0–28.0)
Calcium, Ion: 1.34 mmol/L (ref 1.15–1.40)
HCT: 31 % — ABNORMAL LOW (ref 36.0–46.0)
Hemoglobin: 10.5 g/dL — ABNORMAL LOW (ref 12.0–15.0)
O2 Saturation: 99 %
Potassium: 4.5 mmol/L (ref 3.5–5.1)
Sodium: 136 mmol/L (ref 135–145)
TCO2: 26 mmol/L (ref 22–32)
pCO2, Ven: 34.8 mmHg — ABNORMAL LOW (ref 44.0–60.0)
pH, Ven: 7.461 — ABNORMAL HIGH (ref 7.250–7.430)
pO2, Ven: 137 mmHg — ABNORMAL HIGH (ref 32.0–45.0)

## 2021-07-07 LAB — COMPREHENSIVE METABOLIC PANEL
ALT: 17 U/L (ref 0–44)
AST: 16 U/L (ref 15–41)
Albumin: 3.4 g/dL — ABNORMAL LOW (ref 3.5–5.0)
Alkaline Phosphatase: 79 U/L (ref 38–126)
Anion gap: 12 (ref 5–15)
BUN: 38 mg/dL — ABNORMAL HIGH (ref 8–23)
CO2: 22 mmol/L (ref 22–32)
Calcium: 11.1 mg/dL — ABNORMAL HIGH (ref 8.9–10.3)
Chloride: 103 mmol/L (ref 98–111)
Creatinine, Ser: 2.24 mg/dL — ABNORMAL HIGH (ref 0.44–1.00)
GFR, Estimated: 23 mL/min — ABNORMAL LOW (ref >60)
Glucose, Bld: 105 mg/dL — ABNORMAL HIGH (ref 70–99)
Potassium: 4.3 mmol/L (ref 3.5–5.1)
Sodium: 137 mmol/L (ref 135–145)
Total Bilirubin: 0.5 mg/dL (ref 0.3–1.2)
Total Protein: 6.6 g/dL (ref 6.5–8.1)

## 2021-07-07 LAB — URINALYSIS, ROUTINE W REFLEX MICROSCOPIC
Bilirubin Urine: NEGATIVE
Glucose, UA: NEGATIVE mg/dL
Hgb urine dipstick: NEGATIVE
Ketones, ur: NEGATIVE mg/dL
Leukocytes,Ua: NEGATIVE
Nitrite: NEGATIVE
Protein, ur: NEGATIVE mg/dL
Specific Gravity, Urine: 1.016 (ref 1.005–1.030)
pH: 5 (ref 5.0–8.0)

## 2021-07-07 LAB — RESP PANEL BY RT-PCR (FLU A&B, COVID) ARPGX2
Influenza A by PCR: NEGATIVE
Influenza B by PCR: NEGATIVE
SARS Coronavirus 2 by RT PCR: POSITIVE — AB

## 2021-07-07 LAB — BLOOD GAS, VENOUS
Acid-base deficit: 1.7 mmol/L (ref 0.0–2.0)
Bicarbonate: 23.4 mmol/L (ref 20.0–28.0)
FIO2: 32
O2 Saturation: 42.7 %
Patient temperature: 37
pCO2, Ven: 45.3 mmHg (ref 44.0–60.0)
pH, Ven: 7.333 (ref 7.250–7.430)
pO2, Ven: <31 mmHg — CL (ref 32.0–45.0)

## 2021-07-07 LAB — MAGNESIUM: Magnesium: 2.5 mg/dL — ABNORMAL HIGH (ref 1.7–2.4)

## 2021-07-07 LAB — CBG MONITORING, ED: Glucose-Capillary: 106 mg/dL — ABNORMAL HIGH (ref 70–99)

## 2021-07-07 LAB — TSH: TSH: 2.243 u[IU]/mL (ref 0.350–4.500)

## 2021-07-07 LAB — GLUCOSE, CAPILLARY: Glucose-Capillary: 105 mg/dL — ABNORMAL HIGH (ref 70–99)

## 2021-07-07 MED ORDER — LORATADINE 10 MG PO TABS
10.0000 mg | ORAL_TABLET | Freq: Every day | ORAL | Status: DC
  Administered 2021-07-08 – 2021-07-25 (×18): 10 mg via ORAL
  Filled 2021-07-07 (×18): qty 1

## 2021-07-07 MED ORDER — FAMOTIDINE 20 MG PO TABS
20.0000 mg | ORAL_TABLET | Freq: Every day | ORAL | Status: DC
  Administered 2021-07-07 – 2021-07-25 (×19): 20 mg via ORAL
  Filled 2021-07-07 (×18): qty 1

## 2021-07-07 MED ORDER — LACTATED RINGERS IV BOLUS
1000.0000 mL | Freq: Once | INTRAVENOUS | Status: AC
  Administered 2021-07-07: 1000 mL via INTRAVENOUS

## 2021-07-07 MED ORDER — ROSUVASTATIN CALCIUM 20 MG PO TABS
20.0000 mg | ORAL_TABLET | Freq: Every day | ORAL | Status: DC
  Administered 2021-07-07 – 2021-07-25 (×19): 20 mg via ORAL
  Filled 2021-07-07: qty 4
  Filled 2021-07-07 (×17): qty 1

## 2021-07-07 MED ORDER — TOPIRAMATE 25 MG PO TABS
50.0000 mg | ORAL_TABLET | Freq: Two times a day (BID) | ORAL | Status: DC
  Administered 2021-07-07 – 2021-07-10 (×5): 50 mg via ORAL
  Filled 2021-07-07 (×7): qty 2

## 2021-07-07 MED ORDER — TRAZODONE HCL 50 MG PO TABS
25.0000 mg | ORAL_TABLET | Freq: Every day | ORAL | Status: DC
  Administered 2021-07-07 – 2021-07-13 (×6): 50 mg via ORAL
  Administered 2021-07-14 – 2021-07-15 (×2): 25 mg via ORAL
  Administered 2021-07-16 – 2021-07-24 (×9): 50 mg via ORAL
  Filled 2021-07-07 (×18): qty 1

## 2021-07-07 MED ORDER — LEVOTHYROXINE SODIUM 88 MCG PO TABS
88.0000 ug | ORAL_TABLET | Freq: Every day | ORAL | Status: DC
  Administered 2021-07-08 – 2021-07-25 (×18): 88 ug via ORAL
  Filled 2021-07-07 (×17): qty 1

## 2021-07-07 MED ORDER — SODIUM CHLORIDE 0.9 % IV SOLN: INTRAVENOUS | Status: DC

## 2021-07-07 MED ORDER — DIPHENHYDRAMINE HCL 25 MG PO CAPS: 25.0000 mg | ORAL_CAPSULE | Freq: Three times a day (TID) | ORAL | Status: DC | PRN

## 2021-07-07 MED ORDER — ONDANSETRON HCL 4 MG/2ML IJ SOLN: 4.0000 mg | Freq: Four times a day (QID) | INTRAMUSCULAR | Status: DC | PRN

## 2021-07-07 MED ORDER — FOLIC ACID 1 MG PO TABS: 1.0000 mg | ORAL_TABLET | Freq: Every day | ORAL | Status: DC

## 2021-07-07 MED ORDER — HEPARIN SODIUM (PORCINE) 5000 UNIT/ML IJ SOLN
5000.0000 [IU] | Freq: Two times a day (BID) | INTRAMUSCULAR | Status: DC
  Administered 2021-07-07 – 2021-07-25 (×36): 5000 [IU] via SUBCUTANEOUS
  Filled 2021-07-07 (×36): qty 1

## 2021-07-07 MED ORDER — SODIUM CHLORIDE 0.9 % IV BOLUS
1000.0000 mL | Freq: Once | INTRAVENOUS | Status: AC
  Administered 2021-07-07: 1000 mL via INTRAVENOUS

## 2021-07-07 MED ORDER — ONDANSETRON HCL 4 MG PO TABS: 4.0000 mg | ORAL_TABLET | Freq: Four times a day (QID) | ORAL | Status: DC | PRN

## 2021-07-07 MED ORDER — ALBUTEROL SULFATE (2.5 MG/3ML) 0.083% IN NEBU: 3.0000 mL | INHALATION_SOLUTION | RESPIRATORY_TRACT | Status: DC | PRN

## 2021-07-07 MED ORDER — HYDRALAZINE HCL 25 MG PO TABS
25.0000 mg | ORAL_TABLET | Freq: Four times a day (QID) | ORAL | Status: DC | PRN
  Administered 2021-07-08: 25 mg via ORAL
  Filled 2021-07-07: qty 1

## 2021-07-07 MED ORDER — ACETAMINOPHEN 500 MG PO TABS: 1000.0000 mg | ORAL_TABLET | Freq: Three times a day (TID) | ORAL | Status: DC | PRN

## 2021-07-07 NOTE — ED Triage Notes (Signed)
Pt from [facility] had fall yesterday, which she does not remember, and staff reports today she is altered. Family reports that she is not altered and is at her baseline but is slower than usual to follow directions/respond. Cannot move L arm at baseline.

## 2021-07-07 NOTE — ED Provider Notes (Signed)
  Physical Exam  BP (!) 110/92   Pulse (!) 107   Temp 99.8 F (37.7 C) (Rectal)   Resp 15   SpO2 99%     ED Course/Procedures     Procedures  MDM    26 F presenting to the ED for functional decline. Per family, some cognitive decline and feel she is not at baseline. AMS workup and reassess.  Pulm assumption of care, the patient was pleasantly demented, vitals significant for afebrile, hemodynamically stable, mildly tachycardic with a heart rate in the low 100s, saturating well on room air.  The patient presents with increasing confusion and cognitive decline from her skilled nursing facility.  Altered mental status work-up had been initiated significant for CT head without acute intracranial abnormality, chest x-ray without bacterial pneumonia or other intrathoracic abnormality, EKG without ischemic changes, urinalysis without evidence of UTI, VBG without and acidosis, magnesium 2.5, CBC without a leukocytosis, stable anemia to 10.3, CMP with normal potassium and sodium, hypercalcemia to 11.1, new AKI on CKD with a creatinine of 2.24 and an elevated BUN to 38.  TSH normal.  The patient is status post 1 L IV fluid bolus and remains mildly tachycardic with an AKI and evidence of dehydration with dry mucous membranes.  Baseline serum creatinine appears to be between 1.1 and 1.4.  Given the patient's dehydration, altered mental status in the setting of mild hypercalcemia, uremia and new AKI, dehydration requiring IV fluid resuscitation, hospitalist medicine was consulted for admission.     Regan Lemming, MD 07/07/21 [phone]

## 2021-07-07 NOTE — H&P (Signed)
History and Physical    Joanna [name] SWN:[id number] DOB: 1951-01-26 DOA: 07/07/2021  PCP: [name], MD (Confirm with patient/family/NH records and if not entered, this has to be entered at [facility] point of entry) Patient coming from: SNF  I have personally briefly reviewed patient's old medical records in [place]  Chief Complaint: I fell last night.  HPI: Joanna [name] is a 70 y.o. female with medical history significant of IIDM, CKD stage II, HTN, hypothyroidism, idiopathic left upper extremity spasm hyperplasia,, chronic urinary incontinence, recent UTI, came in for increasing confusion.  Patient remember she fell last night, she remembers she fell lightheaded before episode, but she does not remember whether she had loss conscious or not.  Family/niece at bedside reported patient appears to have decreased oral intake for last few days, patient however denies any abdominal pain, no nausea vomiting no diarrhea.  Patient denies any headache, no blurry vision, no cough no chest pain no urinary symptoms.  No fever or chills.  Otherwise, answered all the questions appropriately.  Patient was found confused this morning by nursing home staff and then sent to the ED.  ED Course: Blood pressure significantly elevated, somewhat confused, no agitation.  No hypoxia, blood work showed no leukocytosis, creatinine 2.2 compared to baseline 1.12 weeks ago.  VBG 7.46/34/137  Review of Systems: As per HPI otherwise 14 point review of systems negative.    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 GIVENS   Bilateral headaches    Depression    Diabetes mellitus    Diverticulosis    Dyslipidemia    GERD (gastroesophageal reflux disease)    Hypertension    Insomnia    Microproteinuria    Renal insufficiency 2010   Right carpal tunnel syndrome 12/24/2017   Sleep apnea    Sleep apnea 12/25/2017   Abnormal sleep study February  2019 CPAP ordered   Thyroid disease    hypothyroid    Past Surgical History:  Procedure Laterality Date   ABDOMINAL HYSTERECTOMY  1984   BACK SURGERY     BILATERAL OOPHORECTOMY     BIOPSY  02/19/2020   Procedure: BIOPSY;  Surgeon: [name], MD;  [facility];  Service: Endoscopy;;  gastric   CERVICAL DISC SURGERY  01/16/2019   COLONOSCOPY  01/29/12   Fields-2-3 hyperplastic polyps, mod internal hemorrhoids, diverticulosis throughout colon   COLONOSCOPY N/A 07/03/2014   Dr. Oneida Alar: Hyperplastic polyps   COLONOSCOPY N/A 06/06/2019   Dr. Oneida Alar: Diverticulosis, hemorrhoids, single polyp removed, pathology revealed lipoma.  Next colonoscopy 10 years   ESOPHAGOGASTRODUODENOSCOPY   01/29/2012   Fields-2-3cm sliding HH, fundic gland polyps, NEGATIVE H pylori   ESOPHAGOGASTRODUODENOSCOPY N/A 07/03/2014   Dr. Oneida Alar: gastritis and normal small bowel biopsies   ESOPHAGOGASTRODUODENOSCOPY N/A 02/19/2020   Fields: hypertrophic folds in the stomach. benign polyp removed   GIVENS CAPSULE STUDY N/A 10/26/2014   SMALL BOWEL AVMs   KNEE ARTHROSCOPY Right    POLYPECTOMY  06/06/2019   Procedure: POLYPECTOMY;  Surgeon: [name], MD;  [facility];  Service: Endoscopy;;   TOTAL ABDOMINAL HYSTERECTOMY W/ BILATERAL SALPINGOOPHORECTOMY       reports that she has been smoking cigarettes. She has a 7.50 pack-year smoking history. She quit smokeless tobacco use about 10 years ago. She reports current alcohol use. She reports that she does not use drugs.  Allergies  Allergen Reactions   Zoloft [  Sertraline Hcl] Other (See Comments)    Withdrawn,bad dreams   Ivp Dye [Iodinated Diagnostic Agents] Rash    Family History  Problem Relation Age of Onset   Cardiomyopathy Father    Heart failure Mother    Hyperlipidemia Mother    Stroke Brother    Kidney cancer Brother    Diabetes Paternal Uncle    Heart disease Paternal Grandmother    Colon cancer Neg Hx    Colon polyps Neg  Hx      Prior to Admission medications   Medication Sig Start Date End Date Taking? Authorizing Provider  acetaminophen (TYLENOL) 500 MG tablet Take 1,000 mg by mouth every 8 (eight) hours as needed for moderate pain.    [provider]  albuterol (PROVENTIL HFA;VENTOLIN HFA) 108 (90 Base) MCG/ACT inhaler Inhale 2 puffs into the lungs every 4 (four) hours as needed for wheezing or shortness of breath. 12/25/18   [name], MD  Cyanocobalamin (VITAMIN B-12 PO) Take 1 tablet by mouth daily.    [provider]  diphenhydrAMINE (BENADRYL) 25 mg capsule Take 1 capsule (25 mg total) by mouth every 8 (eight) hours as needed for itching or allergies. 06/28/21   Barb Merino, MD  famotidine (PEPCID) 20 MG tablet Take 1 tablet (20 mg total) by mouth 2 (two) times daily. 06/28/21 06/28/22  Barb Merino, MD  ferrous sulfate 325 (65 FE) MG tablet Take 325 mg by mouth daily with breakfast.    [provider]  fexofenadine (ALLEGRA) 180 MG tablet Take 180 mg by mouth daily as needed for allergies.    [provider]  folic acid (FOLVITE) 1 MG tablet TAKE 1 TABLET(1 MG) BY MOUTH DAILY Patient taking differently: Take 1 mg by mouth daily. TAKE 1 TABLET(1 MG) BY MOUTH DAILY 04/28/21   [name], MD  indapamide (LOZOL) 1.25 MG tablet Take 1.25 mg by mouth daily. 06/12/21   [provider]  levothyroxine (SYNTHROID) 88 MCG tablet Take 1 tablet (88 mcg total) by mouth daily before breakfast. 06/07/21   [name], MD  losartan (COZAAR) 100 MG tablet Take 100 mg by mouth daily. 06/12/21   [provider]  Magnesium 250 MG TABS Take 1 tablet (250 mg total) by mouth 2 (two) times daily. 06/28/21   Barb Merino, MD  potassium chloride (KLOR-CON) 10 MEQ tablet Take 3 tablets po each morning and 3 tablet po each evening Patient taking differently: Take 20 mEq by mouth in the morning and at bedtime. 01/21/21   [name], MD  rosuvastatin (CRESTOR)  20 MG tablet Take 1 tablet by mouth daily (Stop Pravastatin) Patient taking differently: Take 20 mg by mouth daily. 01/19/21   [name], MD  topiramate (TOPAMAX) 50 MG tablet Take 1 tablet (50 mg total) by mouth 2 (two) times daily. 04/29/21   [name], MD  traZODone (DESYREL) 50 MG tablet TAKE 1/2 TO 1 TABLET BY MOUTH EVERY NIGHT FOR SLEEP Patient taking differently: Take 25-50 mg by mouth at bedtime. 01/19/21   [name], MD    Physical Exam: Vitals:   07/07/21 1445 07/07/21 1500 07/07/21 1515 07/07/21 1630  BP: 109/86 (!) 110/92 122/84 115/87  Pulse: (!) 107 (!) 107 97 96  Resp: 19 15 17 18   Temp:      TempSrc:      SpO2: 100% 99% 99% 99%    Constitutional: NAD, calm, comfortable Vitals:   07/07/21 1445 07/07/21 1500 07/07/21 1515 07/07/21  1630  BP: 109/86 (!) 110/92 122/84 115/87  Pulse: (!) 107 (!) 107 97 96  Resp: 19 15 17 18   Temp:      TempSrc:      SpO2: 100% 99% 99% 99%   Eyes: PERRL, lids and conjunctivae normal ENMT: Mucous membranes are dry. Posterior pharynx clear of any exudate or lesions.Normal dentition.  Neck: normal, supple, no masses, no thyromegaly Respiratory: clear to auscultation bilaterally, no wheezing, no crackles. Normal respiratory effort. No accessory muscle use.  Cardiovascular: Regular rate and rhythm, no murmurs / rubs / gallops. No extremity edema. 2+ pedal pulses. No carotid bruits.  Abdomen: no tenderness, no masses palpated. No hepatosplenomegaly. Bowel sounds positive.  Musculoskeletal: no clubbing / cyanosis. No joint deformity upper and lower extremities. Good ROM, no contractures. Normal muscle tone.  Skin: no rashes, lesions, ulcers. No induration Neurologic: CN 2-12 grossly intact. Sensation intact, DTR normal. Strength 5/5 in all 4.  Psychiatric: Normal judgment and insight. Alert and oriented x 3. Normal mood.     Labs on Admission: I have personally reviewed following labs and imaging studies  CBC: Recent Labs   Lab 07/07/21 1512 07/07/21 1618  WBC 8.8  --   HGB 10.3* 10.5*  HCT 33.7* 31.0*  MCV 94.9  --   PLT 274  --    Basic Metabolic Panel: Recent Labs  Lab 07/07/21 1512 07/07/21 1618  NA 137 136  K 4.3 4.5  CL 103  --   CO2 22  --   GLUCOSE 105*  --   BUN 38*  --   CREATININE 2.24*  --   CALCIUM 11.1*  --   MG 2.5*  --    GFR: Estimated Creatinine Clearance: 18.7 mL/min (A) (by C-G formula based on SCr of 2.24 mg/dL (H)). Liver Function Tests: Recent Labs  Lab 07/07/21 1512  AST 16  ALT 17  ALKPHOS 79  BILITOT 0.5  PROT 6.6  ALBUMIN 3.4*   No results for input(s): LIPASE, AMYLASE in the last 168 hours. No results for input(s): AMMONIA in the last 168 hours. Coagulation Profile: No results for input(s): INR, PROTIME in the last 168 hours. Cardiac Enzymes: No results for input(s): CKTOTAL, CKMB, CKMBINDEX, TROPONINI in the last 168 hours. BNP (last 3 results) No results for input(s): PROBNP in the last 8760 hours. HbA1C: No results for input(s): HGBA1C in the last 72 hours. CBG: Recent Labs  Lab 07/07/21 1417  GLUCAP 106*   Lipid Profile: No results for input(s): CHOL, HDL, LDLCALC, TRIG, CHOLHDL, LDLDIRECT in the last 72 hours. Thyroid Function Tests: Recent Labs    07/07/21 1512  TSH 2.243   Anemia Panel: No results for input(s): VITAMINB12, FOLATE, FERRITIN, TIBC, IRON, RETICCTPCT in the last 72 hours. Urine analysis:    Component Value Date/Time   COLORURINE YELLOW 07/07/2021 1622   APPEARANCEUR CLEAR 07/07/2021 1622   LABSPEC 1.016 07/07/2021 1622   PHURINE 5.0 07/07/2021 1622   GLUCOSEU NEGATIVE 07/07/2021 1622   HGBUR NEGATIVE 07/07/2021 1622   BILIRUBINUR NEGATIVE 07/07/2021 1622   KETONESUR NEGATIVE 07/07/2021 1622   PROTEINUR NEGATIVE 07/07/2021 1622   NITRITE NEGATIVE 07/07/2021 1622   LEUKOCYTESUR NEGATIVE 07/07/2021 1622    Radiological Exams on Admission: CT HEAD WO CONTRAST (5MM)  Result Date: 07/07/2021 CLINICAL DATA:   Altered mental status EXAM: CT HEAD WITHOUT CONTRAST TECHNIQUE: Contiguous axial images were obtained from the base of the skull through the vertex without intravenous contrast. COMPARISON:  Brain MRI 11/12/2019, CT head  06/25/2021 FINDINGS: Brain: There is no evidence of acute intracranial hemorrhage, extra-axial fluid collection, or acute infarct. Foci of hypodensity in the subcortical and periventricular white matter are again seen, likely reflecting sequela of chronic white matter microangiopathy. The ventricles are stable in size. There is no mass lesion. There is no midline shift. Vascular: There is calcification of the bilateral cavernous ICAs. Skull: Normal. Negative for fracture or focal lesion. Sinuses/Orbits: The imaged paranasal sinuses are clear. Bilateral lens implants are in place. The globes and orbits are otherwise unremarkable. Other: None. IMPRESSION: No acute intracranial pathology. Electronically Signed   By: Valetta Mole M.D.   On: 07/07/2021 16:29   DG Chest Portable 1 View  Result Date: 07/07/2021 CLINICAL DATA:  Altered mental status EXAM: PORTABLE CHEST 1 VIEW COMPARISON:  06/24/2021 FINDINGS: Cardiac shadow is mildly prominent but stable. Aortic calcifications are again seen and stable. Postsurgical changes are noted in the cervical spine. Lungs are clear bilaterally. Previously seen radiopaque density over the mid chest is no longer identified and likely extrinsic to the patient. IMPRESSION: No acute abnormality noted. Electronically Signed   By: Inez Catalina M.D.   On: 07/07/2021 16:01    EKG: Independently reviewed. Sinus tachy, no PRN or QTC changes.  Assessment/Plan Active Problems:   AKI (acute kidney injury) ([place])  (please populate well all problems here in Problem List. (For example, if patient is on BP meds at home and you resume or decide to hold them, it is a problem that needs to be her. Same for CAD, COPD, HLD and so on)  Acute metabolic encephalopathy versus  concussion -Probably multi-factorial from dehydration and subsequent hypercalcemia, concussion, IF no significant mentation improvement tomorrow, consider rescan CT head. -UTI ruled out.  AKI on CKD stage II -Clinically appears to have dehydration, received 1 L of LR in the ED, start maintenance IV fluid normal saline 100 mL/h for 1 day.  Recheck BMP tomorrow -Renal ultrasound rule out obstruction  Fall -There was a near syncope which agreed with her dehydration, unsure of LOC, CT head no acute finding. Will do orthostatic BP in AM after hydration. Echo was done two months ago, will not repeat this time.  Hypercalcemia -Acute, likely from dehydration, will hydrate and reevaluate.  HTN -Will hold HCTZ and ARB due to AKI. -As needed hydralazine, consider other type BP medication(s) to send patient back to nursing home.  Hypothyroidism -Check TSH.  Chronic iron deficiency anemia -Continue iron supplement.  DVT prophylaxis: Heparin subcu Code Status: Full code Family Communication: Niece at bedside Disposition Plan: Expect less than 2 midnight hospital stay. Consults called: None Admission status: Tele obs   Lequita Halt MD [facility] Pager [phone]  07/07/2021, 5:49 PM

## 2021-07-07 NOTE — ED Provider Notes (Signed)
[facility] EMERGENCY DEPARTMENT Provider Note   CSN: [id number] Arrival date & time:        History Chief Complaint  Patient presents with   Altered Mental Status    Joanna [name] is a 70 y.o. female BIB EMS from [place] for altered mental status.  Patient previously living independently until the beginning of this month when she was hospitalized after being found on the floor at home. Discharged to [facility] SNF 9 days ago.  Per EMS, patient reportedly fell out of a chair yesterday at [facility]. Today appeared altered to staff at the facility. Patient denies complaints and is unsure why she's here. Answers questions appropriately. Family feel she's at her baseline but maybe slower to respond.     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 G[facility]ENS   Bilateral headaches    Depression    Diabetes mellitus    Diverticulosis    Dyslipidemia    GERD (gastroesophageal reflux disease)    Hypertension    Insomnia    Microproteinuria    Renal insufficiency 2010   Right carpal tunnel syndrome 12/24/2017   Sleep apnea    Sleep apnea 12/25/2017   Abnormal sleep study February 2019 CPAP ordered   Thyroid disease    hypothyroid    Patient Active Problem List   Diagnosis Date Noted   Acute renal failure superimposed on stage 4 chronic kidney disease ([place]) 06/26/2021   Dehydration 06/25/2021   AKI (acute kidney injury) ([place]) 06/25/2021   Fall at home, initial encounter 06/25/2021   Hypernatremia 06/25/2021   Dizziness and giddiness 04/19/2021   Hypomagnesemia 04/19/2021   Hypokalemia 04/19/2021   Near syncope 04/19/2021   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 ([place]) 01/19/2021   Non-recurrent acute suppurative otitis media of left ear without spontaneous rupture of tympanic membrane 01/06/2021   Seasonal allergic rhinitis due to pollen 01/06/2021    Abnormal CT scan, stomach 02/06/2020   Coronary artery disease due to lipid rich plaque 11/19/2019   Aortic atherosclerosis ([place]) 11/19/2019   Constipation 10/06/2019   AVM (arteriovenous malformation) of small bowel, acquired    IDA (iron deficiency anemia) 12/03/2018   Sleep apnea 12/25/2017   Right carpal tunnel syndrome 12/24/2017   Chronic anxiety 02/13/2017   Right rotator cuff tendinitis 02/11/2015   Iron deficiency 08/25/2014   Microcytic anemia 06/17/2014   Insomnia 05/04/2014   Hypothyroidism 06/05/2013   Renal insufficiency 06/05/2013   Essential hypertension, benign 06/05/2013   Stress fracture of right foot 02/27/2013   Chronic radicular lumbar pain 02/27/2013   Hyperlipidemia 01/16/2013   Type 2 diabetes mellitus with stage 4 chronic kidney disease, without long-term current use of insulin ([place]) 01/16/2013    Past Surgical History:  Procedure Laterality Date   ABDOMINAL HYSTERECTOMY  1984   BACK SURGERY     BILATERAL OOPHORECTOMY     BIOPSY  02/19/2020   Procedure: BIOPSY;  Surgeon: [name], MD;  [facility];  Service: Endoscopy;;  gastric   CERVICAL DISC SURGERY  01/16/2019   COLONOSCOPY  01/29/12   Fields-2-3 hyperplastic polyps, mod internal hemorrhoids, diverticulosis throughout colon   COLONOSCOPY N/A 07/03/2014   Dr. Oneida Alar: Hyperplastic polyps   COLONOSCOPY N/A 06/06/2019   Dr. Oneida Alar: Diverticulosis, hemorrhoids, single polyp removed, pathology revealed lipoma.  Next colonoscopy 10 years   ESOPHAGOGASTRODUODENOSCOPY   01/29/2012   Fields-2-3cm sliding HH, fundic gland polyps,  NEGATIVE H pylori   ESOPHAGOGASTRODUODENOSCOPY N/A 07/03/2014   Dr. Oneida Alar: gastritis and normal small bowel biopsies   ESOPHAGOGASTRODUODENOSCOPY N/A 02/19/2020   Fields: hypertrophic folds in the stomach. benign polyp removed   GIVENS CAPSULE STUDY N/A 10/26/2014   SMALL BOWEL AVMs   KNEE ARTHROSCOPY Right    POLYPECTOMY  06/06/2019   Procedure: POLYPECTOMY;   Surgeon: [name], MD;  [facility];  Service: Endoscopy;;   TOTAL ABDOMINAL HYSTERECTOMY W/ BILATERAL SALPINGOOPHORECTOMY       OB History   No obstetric history on file.     Family History  Problem Relation Age of Onset   Cardiomyopathy Father    Heart failure Mother    Hyperlipidemia Mother    Stroke Brother    Kidney cancer Brother    Diabetes Paternal Uncle    Heart disease Paternal Grandmother    Colon cancer Neg Hx    Colon polyps Neg Hx     Social History   Tobacco Use   Smoking status: Every Day    Packs/day: 0.25    Years: 30.00    Pack years: 7.50    Types: Cigarettes    Last attempt to quit: 10/16/2010    Years since quitting: 10.7   Smokeless tobacco: Former    Quit date: 10/28/2010  Vaping Use   Vaping Use: Never used  Substance Use Topics   Alcohol use: Yes    Comment: socially, maybe 3 times a year    Drug use: No    Home Medications Prior to Admission medications   Medication Sig Start Date End Date Taking? Authorizing Provider  acetaminophen (TYLENOL) 500 MG tablet Take 1,000 mg by mouth every 8 (eight) hours as needed for moderate pain.    [provider]  albuterol (PROVENTIL HFA;VENTOLIN HFA) 108 (90 Base) MCG/ACT inhaler Inhale 2 puffs into the lungs every 4 (four) hours as needed for wheezing or shortness of breath. 12/25/18   [name], MD  Cyanocobalamin (VITAMIN B-12 PO) Take 1 tablet by mouth daily.    [provider]  diphenhydrAMINE (BENADRYL) 25 mg capsule Take 1 capsule (25 mg total) by mouth every 8 (eight) hours as needed for itching or allergies. 06/28/21   Barb Merino, MD  famotidine (PEPCID) 20 MG tablet Take 1 tablet (20 mg total) by mouth 2 (two) times daily. 06/28/21 06/28/22  Barb Merino, MD  ferrous sulfate 325 (65 FE) MG tablet Take 325 mg by mouth daily with breakfast.    [provider]  fexofenadine (ALLEGRA) 180 MG tablet Take 180 mg by mouth daily as needed for  allergies.    [provider]  folic acid (FOLVITE) 1 MG tablet TAKE 1 TABLET(1 MG) BY MOUTH DAILY Patient taking differently: Take 1 mg by mouth daily. TAKE 1 TABLET(1 MG) BY MOUTH DAILY 04/28/21   [name], MD  indapamide (LOZOL) 1.25 MG tablet Take 1.25 mg by mouth daily. 06/12/21   [provider]  levothyroxine (SYNTHROID) 88 MCG tablet Take 1 tablet (88 mcg total) by mouth daily before breakfast. 06/07/21   [name], MD  losartan (COZAAR) 100 MG tablet Take 100 mg by mouth daily. 06/12/21   [provider]  Magnesium 250 MG TABS Take 1 tablet (250 mg total) by mouth 2 (two) times daily. 06/28/21   Barb Merino, MD  potassium chloride (KLOR-CON) 10 MEQ tablet Take 3 tablets po each morning and 3 tablet po each evening Patient taking differently: Take  20 mEq by mouth in the morning and at bedtime. 01/21/21   [name], MD  rosuvastatin (CRESTOR) 20 MG tablet Take 1 tablet by mouth daily (Stop Pravastatin) Patient taking differently: Take 20 mg by mouth daily. 01/19/21   [name], MD  topiramate (TOPAMAX) 50 MG tablet Take 1 tablet (50 mg total) by mouth 2 (two) times daily. 04/29/21   [name], MD  traZODone (DESYREL) 50 MG tablet TAKE 1/2 TO 1 TABLET BY MOUTH EVERY NIGHT FOR SLEEP Patient taking differently: Take 25-50 mg by mouth at bedtime. 01/19/21   [name], MD    Allergies    Zoloft [sertraline hcl] and Ivp dye [iodinated diagnostic agents]  Review of Systems   Review of Systems  Unable to perform ROS: Mental status change   Physical Exam Updated Vital Signs BP (!) 110/92   Pulse (!) 107   Temp 99.8 F (37.7 C) (Rectal)   Resp 15   SpO2 99%   Physical Exam Constitutional:      General: She is not in acute distress.    Comments: Chronically ill appearing elderly female  HENT:     Head: Atraumatic.     Mouth/Throat:     Mouth: Mucous membranes are moist.  Cardiovascular:     Rate and Rhythm: Tachycardia  present.     Heart sounds: Normal heart sounds.  Pulmonary:     Effort: Pulmonary effort is normal.     Breath sounds: Normal breath sounds.  Abdominal:     General: There is no distension.     Palpations: Abdomen is soft.     Tenderness: There is no abdominal tenderness.  Musculoskeletal:     Cervical back: Neck supple.  Skin:    General: Skin is warm and dry.     Capillary Refill: Capillary refill takes 2 to 3 seconds.     Findings: No rash.  Neurological:     Mental Status: She is alert and oriented to person, place, and time.     Comments: 1/5 strength in LUE and bilateral lower extremities. Follows simple commands    ED Results / Procedures / Treatments   Labs (all labs ordered are listed, but only abnormal results are displayed) Labs Reviewed  CBG MONITORING, ED - Abnormal; Notable for the following components:      Result Value   Glucose-Capillary 106 (*)    All other components within normal limits  URINALYSIS, ROUTINE W REFLEX MICROSCOPIC  CBC  COMPREHENSIVE METABOLIC PANEL  MAGNESIUM  TSH  BLOOD GAS, VENOUS    EKG EKG Interpretation  Date/Time:  Thursday July 07 2021 14:13:03 EDT Ventricular Rate:  107 PR Interval:  158 QRS Duration: 86 QT Interval:  354 QTC Calculation: 473 R Axis:   -58 Text Interpretation: Sinus tachycardia Inferior infarct, old Lateral leads are also involved deep inverted t waves in inferior and lateral leads seen on prior No significant change since last tracing Confirmed by Deno Etienne [phone]) on 07/07/2021 2:17:51 PM  Radiology No results found.  Procedures Procedures   Medications Ordered in ED Medications  sodium chloride 0.9 % bolus 1,000 mL (1,000 mLs Intravenous New Bag/Given 07/07/21 1517)    ED Course  I have reviewed the triage vital signs and the nursing notes.  Pertinent labs & imaging results that were available during my care of the patient were reviewed by me and considered in my medical decision making  (see chart for details).    MDM Rules/Calculators/A&P  This is a 70 year old female with hx of HTN, DM2, CKD 4, idiopathic spastic hemiplegia, and hypothyroidism presenting from SNF due to fall and altered mental status.  History somewhat limited by patient's mental status. She denies complaints, but SNF felt she was altered today. She is afebrile with normal vitals other than slight tachycardia. Exam fairly unremarkable other than L sided weakness, which is reportedly her baseline.  Differential is broad and includes intracranial abnormality, infectious etiology, electrolyte derangement, dementia. No concern for substance use. Will obtain basic labs including CBC, CMP, Mag, and UA as well as CXR and CT head.  Will give 1L fluid bolus as she appears dry on exam and is mildly tachycardic. Patient signed out to oncoming provider for further management and to follow up test results.  Final Clinical Impression(s) / ED Diagnoses Final diagnoses:  None    Rx / DC Orders ED Discharge Orders     None        Alcus Dad, MD 07/07/21 [place], [place], DO 07/07/21 1605

## 2021-07-08 ENCOUNTER — Encounter (HOSPITAL_COMMUNITY): Payer: Self-pay | Admitting: Internal Medicine

## 2021-07-08 ENCOUNTER — Inpatient Hospital Stay (HOSPITAL_COMMUNITY): Payer: PPO

## 2021-07-08 DIAGNOSIS — E039 Hypothyroidism, unspecified: Secondary | ICD-10-CM | POA: Diagnosis present

## 2021-07-08 DIAGNOSIS — Z888 Allergy status to other drugs, medicaments and biological substances status: Secondary | ICD-10-CM | POA: Diagnosis not present

## 2021-07-08 DIAGNOSIS — M24542 Contracture, left hand: Secondary | ICD-10-CM | POA: Diagnosis present

## 2021-07-08 DIAGNOSIS — Z7189 Other specified counseling: Secondary | ICD-10-CM | POA: Diagnosis not present

## 2021-07-08 DIAGNOSIS — R6510 Systemic inflammatory response syndrome (SIRS) of non-infectious origin without acute organ dysfunction: Secondary | ICD-10-CM | POA: Diagnosis not present

## 2021-07-08 DIAGNOSIS — F17210 Nicotine dependence, cigarettes, uncomplicated: Secondary | ICD-10-CM | POA: Diagnosis present

## 2021-07-08 DIAGNOSIS — G319 Degenerative disease of nervous system, unspecified: Secondary | ICD-10-CM | POA: Diagnosis present

## 2021-07-08 DIAGNOSIS — Z91041 Radiographic dye allergy status: Secondary | ICD-10-CM | POA: Diagnosis not present

## 2021-07-08 DIAGNOSIS — D509 Iron deficiency anemia, unspecified: Secondary | ICD-10-CM | POA: Diagnosis present

## 2021-07-08 DIAGNOSIS — J1282 Pneumonia due to coronavirus disease 2019: Secondary | ICD-10-CM | POA: Diagnosis present

## 2021-07-08 DIAGNOSIS — I129 Hypertensive chronic kidney disease with stage 1 through stage 4 chronic kidney disease, or unspecified chronic kidney disease: Secondary | ICD-10-CM | POA: Diagnosis present

## 2021-07-08 DIAGNOSIS — U071 COVID-19: Secondary | ICD-10-CM | POA: Diagnosis present

## 2021-07-08 DIAGNOSIS — J159 Unspecified bacterial pneumonia: Secondary | ICD-10-CM | POA: Diagnosis present

## 2021-07-08 DIAGNOSIS — N1832 Chronic kidney disease, stage 3b: Secondary | ICD-10-CM | POA: Diagnosis present

## 2021-07-08 DIAGNOSIS — I700 Atherosclerosis of aorta: Secondary | ICD-10-CM | POA: Diagnosis not present

## 2021-07-08 DIAGNOSIS — R627 Adult failure to thrive: Secondary | ICD-10-CM | POA: Diagnosis present

## 2021-07-08 DIAGNOSIS — R4182 Altered mental status, unspecified: Secondary | ICD-10-CM | POA: Diagnosis not present

## 2021-07-08 DIAGNOSIS — E869 Volume depletion, unspecified: Secondary | ICD-10-CM | POA: Diagnosis not present

## 2021-07-08 DIAGNOSIS — N179 Acute kidney failure, unspecified: Secondary | ICD-10-CM | POA: Diagnosis present

## 2021-07-08 DIAGNOSIS — R531 Weakness: Secondary | ICD-10-CM | POA: Diagnosis not present

## 2021-07-08 DIAGNOSIS — Z79899 Other long term (current) drug therapy: Secondary | ICD-10-CM | POA: Diagnosis not present

## 2021-07-08 DIAGNOSIS — E860 Dehydration: Secondary | ICD-10-CM | POA: Diagnosis present

## 2021-07-08 DIAGNOSIS — E1122 Type 2 diabetes mellitus with diabetic chronic kidney disease: Secondary | ICD-10-CM | POA: Diagnosis present

## 2021-07-08 DIAGNOSIS — D508 Other iron deficiency anemias: Secondary | ICD-10-CM | POA: Diagnosis not present

## 2021-07-08 DIAGNOSIS — J984 Other disorders of lung: Secondary | ICD-10-CM | POA: Diagnosis not present

## 2021-07-08 DIAGNOSIS — Z66 Do not resuscitate: Secondary | ICD-10-CM | POA: Diagnosis present

## 2021-07-08 DIAGNOSIS — E43 Unspecified severe protein-calorie malnutrition: Secondary | ICD-10-CM | POA: Diagnosis present

## 2021-07-08 DIAGNOSIS — E785 Hyperlipidemia, unspecified: Secondary | ICD-10-CM | POA: Diagnosis present

## 2021-07-08 DIAGNOSIS — Z515 Encounter for palliative care: Secondary | ICD-10-CM | POA: Diagnosis not present

## 2021-07-08 DIAGNOSIS — Z6820 Body mass index (BMI) 20.0-20.9, adult: Secondary | ICD-10-CM | POA: Diagnosis not present

## 2021-07-08 DIAGNOSIS — E11649 Type 2 diabetes mellitus with hypoglycemia without coma: Secondary | ICD-10-CM | POA: Diagnosis present

## 2021-07-08 DIAGNOSIS — G9341 Metabolic encephalopathy: Secondary | ICD-10-CM | POA: Diagnosis present

## 2021-07-08 LAB — D-DIMER, QUANTITATIVE: D-Dimer, Quant: 1.2 ug/mL-FEU — ABNORMAL HIGH (ref 0.00–0.50)

## 2021-07-08 LAB — CBC
HCT: 34.1 % — ABNORMAL LOW (ref 36.0–46.0)
Hemoglobin: 10.4 g/dL — ABNORMAL LOW (ref 12.0–15.0)
MCH: 29.1 pg (ref 26.0–34.0)
MCHC: 30.5 g/dL (ref 30.0–36.0)
MCV: 95.3 fL (ref 80.0–100.0)
Platelets: 164 10*3/uL (ref 150–400)
RBC: 3.58 MIL/uL — ABNORMAL LOW (ref 3.87–5.11)
RDW: 13.5 % (ref 11.5–15.5)
WBC: 6 10*3/uL (ref 4.0–10.5)
nRBC: 0 % (ref 0.0–0.2)

## 2021-07-08 LAB — BASIC METABOLIC PANEL
Anion gap: 10 (ref 5–15)
BUN: 31 mg/dL — ABNORMAL HIGH (ref 8–23)
CO2: 22 mmol/L (ref 22–32)
Calcium: 10.6 mg/dL — ABNORMAL HIGH (ref 8.9–10.3)
Chloride: 105 mmol/L (ref 98–111)
Creatinine, Ser: 1.9 mg/dL — ABNORMAL HIGH (ref 0.44–1.00)
GFR, Estimated: 28 mL/min — ABNORMAL LOW (ref >60)
Glucose, Bld: 89 mg/dL (ref 70–99)
Potassium: 4.3 mmol/L (ref 3.5–5.1)
Sodium: 137 mmol/L (ref 135–145)

## 2021-07-08 LAB — GLUCOSE, CAPILLARY
Glucose-Capillary: 92 mg/dL (ref 70–99)
Glucose-Capillary: 89 mg/dL (ref 70–99)

## 2021-07-08 LAB — FERRITIN: Ferritin: 912 ng/mL — ABNORMAL HIGH (ref 11–307)

## 2021-07-08 LAB — C-REACTIVE PROTEIN: CRP: 7.2 mg/dL — ABNORMAL HIGH (ref <1.0)

## 2021-07-08 MED ORDER — ENSURE ENLIVE PO LIQD
237.0000 mL | Freq: Two times a day (BID) | ORAL | Status: DC
  Administered 2021-07-09 – 2021-07-17 (×15): 237 mL via ORAL
  Administered 2021-07-17: 1 via ORAL
  Administered 2021-07-18 – 2021-07-22 (×8): 237 mL via ORAL

## 2021-07-08 MED ORDER — SODIUM CHLORIDE 0.9 % IV SOLN: INTRAVENOUS | Status: DC

## 2021-07-08 MED ORDER — CYANOCOBALAMIN 1000 MCG/ML IJ SOLN
1000.0000 ug | Freq: Every day | INTRAMUSCULAR | Status: DC
  Administered 2021-07-08 – 2021-07-09 (×2): 1000 ug via SUBCUTANEOUS
  Filled 2021-07-08 (×3): qty 1

## 2021-07-08 MED ORDER — ALBUTEROL SULFATE HFA 108 (90 BASE) MCG/ACT IN AERS
2.0000 | INHALATION_SPRAY | Freq: Four times a day (QID) | RESPIRATORY_TRACT | Status: DC
  Administered 2021-07-08 – 2021-07-10 (×4): 2 via RESPIRATORY_TRACT
  Filled 2021-07-08: qty 6.7

## 2021-07-08 MED ORDER — ASCORBIC ACID 500 MG PO TABS
500.0000 mg | ORAL_TABLET | Freq: Every day | ORAL | Status: DC
  Administered 2021-07-08 – 2021-07-25 (×18): 500 mg via ORAL
  Filled 2021-07-08 (×18): qty 1

## 2021-07-08 MED ORDER — SODIUM CHLORIDE 0.9 % IV SOLN
200.0000 mg | Freq: Once | INTRAVENOUS | Status: AC
  Administered 2021-07-08: 200 mg via INTRAVENOUS
  Filled 2021-07-08: qty 40

## 2021-07-08 MED ORDER — SODIUM CHLORIDE 0.9 % IV SOLN
100.0000 mg | Freq: Every day | INTRAVENOUS | Status: AC
  Administered 2021-07-09 – 2021-07-10 (×2): 100 mg via INTRAVENOUS
  Filled 2021-07-08 (×2): qty 20

## 2021-07-08 MED ORDER — ZINC SULFATE 220 (50 ZN) MG PO CAPS
220.0000 mg | ORAL_CAPSULE | Freq: Every day | ORAL | Status: DC
  Administered 2021-07-08 – 2021-07-25 (×18): 220 mg via ORAL
  Filled 2021-07-08 (×18): qty 1

## 2021-07-08 MED ORDER — ADULT MULTIVITAMIN W/MINERALS CH
1.0000 | ORAL_TABLET | Freq: Every day | ORAL | Status: DC
  Administered 2021-07-09 – 2021-07-25 (×17): 1 via ORAL
  Filled 2021-07-08 (×20): qty 1

## 2021-07-08 NOTE — TOC Initial Note (Addendum)
Transition of Care [facility]) - Initial/Assessment Note    Patient Details  Name: Joanna [name] MRN: [id number] Date of Birth: Jul 09, 1951  Transition of Care [facility]) CM/SW Contact:    Joanna [name], Joanna Ee, RN Phone Number: 07/08/2021, 4:08 PM  Clinical Narrative:                 CM went to talk with patient about needs for post hospital transition. Patient recently admitted at [facility]. On Isolation for Covid and asleep. CM called brother, Joanna [name] via phone and spoke with him. States patient lives alone and have friends that helps her when needed. Has two children. Daughter lives in [place] and son lives in [place]. Brother could not give much information. PT requesting SNF at discharge. TOC will continue to follow with needs.    Expected Discharge Plan: Skilled Nursing Facility Barriers to Discharge: Continued Medical Work up   Patient Goals and CMS Choice Patient states their goals for this hospitalization and ongoing recovery are:: To get better and go home CMS Medicare.gov Compare Post Acute Care list provided to:: Patient Choice offered to / list presented to : Patient, Sibling (Brother, Joanna [name])  Expected Discharge Plan and Services Expected Discharge Plan: [place] In-house Referral: Clinical Social Work   Post Acute Care Choice: [place] Living arrangements for the past 2 months: Apartment                           HH Arranged: NA [place] Agency: NA        Prior Living Arrangements/Services Living arrangements for the past 2 months: Apartment Lives with:: Self Patient language and need for interpreter reviewed:: Yes Do you feel safe going back to the place where you live?: Yes      Need for Family Participation in Patient Care: Yes (Comment) Care giver support system in place?: Yes (comment) Current home services: DME Criminal Activity/Legal Involvement Pertinent to Current Situation/Hospitalization: No - Comment as  needed  Activities of Daily Living Home Assistive Devices/Equipment: None ADL Screening (condition at time of admission) Patient's cognitive ability adequate to safely complete daily activities?: Yes Is the patient deaf or have difficulty hearing?: Yes Does the patient have difficulty seeing, even when wearing glasses/contacts?: No Does the patient have difficulty concentrating, remembering, or making decisions?: No Patient able to express need for assistance with ADLs?: Yes Does the patient have difficulty dressing or bathing?: Yes Independently performs ADLs?: No Does the patient have difficulty walking or climbing stairs?: Yes Weakness of Legs: Both Weakness of Arms/Hands: Left (contracted)  Permission Sought/Granted Permission sought to share information with : Case Manager, Customer service manager, Family Supports Permission granted to share information with : Yes, Verbal Permission Granted              Emotional Assessment Appearance:: Appears stated age Attitude/Demeanor/Rapport: Engaged Affect (typically observed): Accepting, Appropriate, Hopeful Orientation: : Oriented to Self, Oriented to Place, Oriented to  Time, Oriented to Situation Alcohol / Substance Use: Not Applicable Psych Involvement: No (comment)  Admission diagnosis:  AKI (acute kidney injury) ([place]) [N17.9] Altered mental status, unspecified altered mental status type [R41.82] Patient Active Problem List   Diagnosis Date Noted   Altered mental status    Acute renal failure superimposed on stage 4 chronic kidney disease ([place]) 06/26/2021   Dehydration 06/25/2021   AKI (acute kidney injury) ([place]) 06/25/2021   Fall at home, initial encounter 06/25/2021   Hypernatremia 06/25/2021  Dizziness and giddiness 04/19/2021   Hypomagnesemia 04/19/2021   Hypokalemia 04/19/2021   Near syncope 04/19/2021   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 ([place]) 01/19/2021    Non-recurrent acute suppurative otitis media of left ear without spontaneous rupture of tympanic membrane 01/06/2021   Seasonal allergic rhinitis due to pollen 01/06/2021   Abnormal CT scan, stomach 02/06/2020   Coronary artery disease due to lipid rich plaque 11/19/2019   Aortic atherosclerosis ([place]) 11/19/2019   Constipation 10/06/2019   AVM (arteriovenous malformation) of small bowel, acquired    IDA (iron deficiency anemia) 12/03/2018   Sleep apnea 12/25/2017   Right carpal tunnel syndrome 12/24/2017   Chronic anxiety 02/13/2017   Right rotator cuff tendinitis 02/11/2015   Iron deficiency 08/25/2014   Microcytic anemia 06/17/2014   Insomnia 05/04/2014   Hypothyroidism 06/05/2013   Renal insufficiency 06/05/2013   Essential hypertension, benign 06/05/2013   Stress fracture of right foot 02/27/2013   Chronic radicular lumbar pain 02/27/2013   Hyperlipidemia 01/16/2013   Type 2 diabetes mellitus with stage 4 chronic kidney disease, without long-term current use of insulin ([place]) 01/16/2013   PCP:  [name], MD Pharmacy:   [facility], [address] [place] [address] Phone: [phone] Fax: [phone]     Social Determinants of Health (SDOH) Interventions    Readmission Risk Interventions Readmission Risk Prevention Plan 06/27/2021  Transportation Screening Complete  HRI or Home Care Consult Complete  Social Work Consult for [place] Planning/Counseling Complete  Palliative Care Screening Not Applicable  Medication Review Press photographer) Complete  Some recent data might be hidden

## 2021-07-08 NOTE — Progress Notes (Signed)
PROGRESS NOTE    VERDELLE VALTIERRA  LZJ:[id number] DOB: 22-Sep-1951 DOA: 07/07/2021 PCP: [name], MD   Chief Complaint  Patient presents with   Altered Mental Status    Brief Narrative/Hospital Course: 70 year old female with history of diabetes, CKD 2, hypertension, hypothyroidism, idiopathic left upper extremity spasm hyperplasia, chronic urinary incontinence recent UTI sent from the skilled nursing facility with confusion. Per report patient had decreased oral intake for last 2 days and patient also had a fall tonight before admission or she felt lightheaded before the episode does not remember whether she had lost consciousness or not. Family/niece at the bedside in the ED providing with history. Patient was found confused 9/22 morning by nursing home staff and sent to the ED for evaluation In the ED blood pressure was significantly elevated, patient somewhat confused, not agitated not hypoxic blood work showed AKI 2.2 from baseline 1.1, COVID-19 was positive CT head no acute finding chest x-ray unremarkable Ultrasound kidney small hyperechoic kidneys.  Patient was placed on IV fluids, and admitted  Subjective: Seen this morning she was alert awake told me her name her date of birth but was repeatedly with answers, follows simple commands.  Left upper extremity spastic She is on room air and does not appear to be in any distress.    Assessment & Plan:  Acute metabolic encephalopathy: Patient confused repeatedly with answers in the setting of dehydration no evidence of UTI CT head no acute finding.  There is reported history of fall.  Keep on supportive care, fall precaution, delirium precaution.we will obtain MRI of the brain as she appears encephalopathic although alert awake. PTOT SLP eval.  Fall/near syncope lightheadedness at the facility: Suspect in the setting of dehydration and AKI.  Keep on IV fluid hydration, check orthostatic vitals. Recent echo 2 months ago-in July EF  60-65% no R WMA  AKI on CKD 2 Baseline creatinine 1.1 on admission 2.2 creatinine improving keep on IV fluid hydration, BMP in a.m. Recent Labs  Lab 07/07/21 1512 07/08/21 0125  BUN 38* 31*  CREATININE 2.24* 1.90*   Hypercalcemia: likely from volume contraction AKI dehydration.  Calcium improving 11.1>10.6, continue on IV fluids. Bmp in am. If still persistently high will check PTH and vitamin D  COVID-19 positive:  symptomatic with weakness but not hypoxic we will keep on IV remdesivir, hold off on steroid; discussed the plan of care with patient's brother over the phone Checking  inflammatory markers No results for input(s): DDIMER, FERRITIN, LDH, CRP in the last 72 hours.  IDA: continue iron supplement  Hypertension: BP stable holding HCTZ and ARB due to AKI.  Continue as needed meds  Hypothyroidism: Euthyroid TSH 2.2, continue home Synthroid  GOC: Currently full code.  Continue on current full scope of treatment.  Discussed with patient's brother who states he is a POA, he states she has has son and daughter.  As per him At baseline she was living in the apartment ambulatory. Diet Order             Diet renal with fluid restriction Fluid restriction: 1200 mL Fluid; Room service appropriate? Yes; Fluid consistency: Thin  Diet effective now                 DVT prophylaxis: heparin injection 5,000 Units Start: 07/07/21 2200 Code Status:   Code Status: Full Code  Family Communication: plan of care discussed with patient at bedside. Status is: admited as Observation Patient remains hospitalized and will need ongoing  IV fluid hydration for management of her hypercalcemia, AKI, encephalopathy.  Dispo: The patient is from: SNF              Anticipated d/c is to: SNF              Patient currently is not medically stable to d/c.   Difficult to place patient No  Objective: Vitals: Today's Vitals   07/07/21 1922 07/07/21 1958 07/08/21 0057 07/08/21 0559  BP:  138/89   (!) 151/97  Pulse:  (!) 108  (!) 104  Resp:  16  16  Temp: 98.4 F (36.9 C) 98.6 F (37 C)  98.3 F (36.8 C)  TempSrc: Oral   Oral  SpO2:  97%  100%  PainSc:   0-No pain    Examination: General exam: AAx self, repeatedly with answers appears weak,older than stated age HEENT:Oral mucosa moist, Ear/Nose WNL grossly,dentition normal. Respiratory system: bilaterally clear breath sounds, no use of accessory muscle, non tender. Cardiovascular system: S1 & S2 +,No JVD. Gastrointestinal system: Abdomen soft, NT,ND, BS+. Nervous System:Alert, awake, spastic left upper extremity  Extremities: no edema, distal peripheral pulses palpable.  Skin: No rashes,no icterus. MSK: Normal muscle bulk,tone, power   Intake/Output Summary (Last 24 hours) at 07/08/2021 1022 Last data filed at 07/08/2021 0800 Gross per 24 hour  Intake 862.62 ml  Output 1700 ml  Net -837.38 ml   There were no vitals filed for this visit. Weight change:    Consultants:see note  Procedures:see note Antimicrobials: Anti-infectives (From admission, onward)    None      Culture/Microbiology    Component Value Date/Time   SDES  06/24/2021 2139    URINE, CATHETERIZED Performed at [facility], [address] [place], [place] [address]    [facility]  06/24/2021 2139    NONE Performed at [facility], [address] [place], [place] [address]    CULT  06/24/2021 2139    NO GROWTH Performed at [facility] [address] [place], [place] [address]    REPTSTATUS 06/26/2021 FINAL 06/24/2021 2139    Other culture-see note  Unresulted Labs (From admission, onward)    None     medications reviewed:  Scheduled Meds:  famotidine  20 mg Oral Daily   heparin  5,000 Units Subcutaneous Q12H   levothyroxine  88 mcg Oral Q0600   loratadine  10 mg Oral Daily   rosuvastatin  20 mg Oral Daily   topiramate  50 mg Oral BID   traZODone  25-50 mg Oral QHS   Continuous Infusions:  sodium chloride 100 mL/hr  at 07/08/21 0534     Intake/Output from previous day: 09/22 0701 - 09/23 0700 In: 802.6 [I.V.:802.6] Out: 1700 [Urine:1700] Intake/Output this shift: Total I/O In: 60 [P.O.:60] Out: -  There were no vitals filed for this visit. Data Reviewed: I have personally reviewed following labs and imaging studies CBC: Recent Labs  Lab 07/07/21 1512 07/07/21 1618 07/08/21 0125  WBC 8.8  --  6.0  HGB 10.3* 10.5* 10.4*  HCT 33.7* 31.0* 34.1*  MCV 94.9  --  95.3  PLT 274  --  287   Basic Metabolic Panel: Recent Labs  Lab 07/07/21 1512 07/07/21 1618 07/08/21 0125  NA 137 136 137  K 4.3 4.5 4.3  CL 103  --  105  CO2 22  --  22  GLUCOSE 105*  --  89  BUN 38*  --  31*  CREATININE 2.24*  --  1.90*  CALCIUM 11.1*  --  10.6*  MG 2.5*  --   --    GFR: Estimated Creatinine Clearance: 22.1 mL/min (A) (by C-G formula based on SCr of 1.9 mg/dL (H)). Liver Function Tests: Recent Labs  Lab 07/07/21 1512  AST 16  ALT 17  ALKPHOS 79  BILITOT 0.5  PROT 6.6  ALBUMIN 3.4*   No results for input(s): LIPASE, AMYLASE in the last 168 hours. No results for input(s): AMMONIA in the last 168 hours. Coagulation Profile: No results for input(s): INR, PROTIME in the last 168 hours. Cardiac Enzymes: No results for input(s): CKTOTAL, CKMB, CKMBINDEX, TROPONINI in the last 168 hours. BNP (last 3 results) No results for input(s): PROBNP in the last 8760 hours. HbA1C: No results for input(s): HGBA1C in the last 72 hours. CBG: Recent Labs  Lab 07/07/21 1417 07/07/21 2139 07/08/21 0644  GLUCAP 106* 105* 92   Lipid Profile: No results for input(s): CHOL, HDL, LDLCALC, TRIG, CHOLHDL, LDLDIRECT in the last 72 hours. Thyroid Function Tests: Recent Labs    07/07/21 1512  TSH 2.243   Anemia Panel: No results for input(s): VITAMINB12, FOLATE, FERRITIN, TIBC, IRON, RETICCTPCT in the last 72 hours. Sepsis Labs: No results for input(s): PROCALCITON, LATICACIDVEN in the last 168  hours.  Recent Results (from the past 240 hour(s))  Resp Panel by RT-PCR (Flu A&B, Covid) Nasopharyngeal Swab     Status: Abnormal   Collection Time: 07/07/21  5:23 PM   Specimen: Nasopharyngeal Swab; Nasopharyngeal(NP) swabs in vial transport medium  Result Value Ref Range Status   SARS Coronavirus 2 by RT PCR POSITIVE (A) NEGATIVE Final    Comment: RESULT CALLED TO, READ BACK BY AND VERIFIED WITH: MELVIN, L.,RN 07/07/2021 2035 BY PAW. (NOTE) SARS-CoV-2 target nucleic acids are DETECTED.  The SARS-CoV-2 RNA is generally detectable in upper respiratory specimens during the acute phase of infection. Positive results are indicative of the presence of the identified virus, but do not rule out bacterial infection or co-infection with other pathogens not detected by the test. Clinical correlation with patient history and other diagnostic information is necessary to determine patient infection status. The expected result is Negative.  Fact Sheet for Patients: EntrepreneurPulse.com.au  Fact Sheet for Healthcare Providers: IncredibleEmployment.be  This test is not yet approved or cleared by the [place] FDA and  has been authorized for detection and/or diagnosis of SARS-CoV-2 by FDA under an Emergency Use Authorization (EUA).  This EUA will remain in effect (meaning this test ca n be used) for the duration of  the COVID-19 declaration under Section 564(b)(1) of the Act, 21 U.S.C. section 360bbb-3(b)(1), unless the authorization is terminated or revoked sooner.     Influenza A by PCR NEGATIVE NEGATIVE Final   Influenza B by PCR NEGATIVE NEGATIVE Final    Comment: (NOTE) The Xpert Xpress SARS-CoV-2/FLU/RSV plus assay is intended as an aid in the diagnosis of influenza from Nasopharyngeal swab specimens and should not be used as a sole basis for treatment. Nasal washings and aspirates are unacceptable for Xpert Xpress  SARS-CoV-2/FLU/RSV testing.  Fact Sheet for Patients: EntrepreneurPulse.com.au  Fact Sheet for Healthcare Providers: IncredibleEmployment.be  This test is not yet approved or cleared by the [place] FDA and has been authorized for detection and/or diagnosis of SARS-CoV-2 by FDA under an Emergency Use Authorization (EUA). This EUA will remain in effect (meaning this test can be used) for the duration of the COVID-19 declaration under Section 564(b)(1) of the Act, 21 U.S.C. section  360bbb-3(b)(1), unless the authorization is terminated or revoked.  Performed at [facility], [place] [address] [place], [place] [address]      Radiology Studies: CT HEAD WO CONTRAST (5MM)  Result Date: 07/07/2021 CLINICAL DATA:  Altered mental status EXAM: CT HEAD WITHOUT CONTRAST TECHNIQUE: Contiguous axial images were obtained from the base of the skull through the vertex without intravenous contrast. COMPARISON:  Brain MRI 11/12/2019, CT head 06/25/2021 FINDINGS: Brain: There is no evidence of acute intracranial hemorrhage, extra-axial fluid collection, or acute infarct. Foci of hypodensity in the subcortical and periventricular white matter are again seen, likely reflecting sequela of chronic white matter microangiopathy. The ventricles are stable in size. There is no mass lesion. There is no midline shift. Vascular: There is calcification of the bilateral cavernous ICAs. Skull: Normal. Negative for fracture or focal lesion. Sinuses/Orbits: The imaged paranasal sinuses are clear. Bilateral lens implants are in place. The globes and orbits are otherwise unremarkable. Other: None. IMPRESSION: No acute intracranial pathology. Electronically Signed   By: Valetta Mole M.D.   On: 07/07/2021 16:29   US RENAL  Result Date: 07/07/2021 CLINICAL DATA:  Acute renal insufficiency EXAM: RENAL / URINARY TRACT ULTRASOUND COMPLETE COMPARISON:  04/05/2020 FINDINGS: Right Kidney:  Renal measurements: 7.4 x 3.4 x 3.7 cm = volume: 51 mL. Increased renal echogenicity. No hydronephrosis. Left Kidney: Renal measurements: 6.8 x 3.5 x 3.4 cm = volume: 42.5 mL. Increased renal echogenicity. No hydronephrosis. Bladder: Partially decompressed. Other: None. IMPRESSION: No hydronephrosis. Small, hyperechoic kidneys, consistent with medical renal disease. Electronically Signed   By: Abigail Miyamoto M.D.   On: 07/07/2021 18:41   DG Chest Portable 1 View  Result Date: 07/07/2021 CLINICAL DATA:  Altered mental status EXAM: PORTABLE CHEST 1 VIEW COMPARISON:  06/24/2021 FINDINGS: Cardiac shadow is mildly prominent but stable. Aortic calcifications are again seen and stable. Postsurgical changes are noted in the cervical spine. Lungs are clear bilaterally. Previously seen radiopaque density over the mid chest is no longer identified and likely extrinsic to the patient. IMPRESSION: No acute abnormality noted. Electronically Signed   By: Inez Catalina M.D.   On: 07/07/2021 16:01     LOS: 0 days   Antonieta Pert, MD [facility]  07/08/2021, 10:22 AM

## 2021-07-08 NOTE — Plan of Care (Signed)
  Problem: Clinical Measurements: Goal: Respiratory complications will improve Outcome: Progressing Goal: Cardiovascular complication will be avoided Outcome: Progressing   Problem: Nutrition: Goal: Adequate nutrition will be maintained Outcome: Progressing   

## 2021-07-08 NOTE — Evaluation (Signed)
Physical Therapy Evaluation Patient Details Name: Joanna [name] MRN: [id number] DOB: [birth year] Today's Date: 07/08/2021  History of Present Illness  70 y.o. female presents to [facility] h[facility] on 07/07/2021 with increasing confusion and fall. Pt found to be COVID-19+ in ED. Pt admitted for AKI and acute metabolic encephalopathy. PMH includes IIDM, CKD stage II, HTN, hypothyroidism, idiopathic left upper extremity spasm hyperplasia,, chronic urinary incontinence, recent UTI.  Clinical Impression  Pt presents to PT with deficits in cognition, level of arousal, balance, functional mobility, strength, power, tone. Pt is lethargic and does not follow motor commands during session. Pt responds to questioning initially during session, but becomes less responsive to questions as session proceeds, only opening eyes with PT assisted mobility. Pt will benefit from continued acute PT services to engage pt in active mobility in an effort to reduce falls risk and caregiver burden. PT recommends SNF placement at this time.       Recommendations for follow up therapy are one component of a multi-disciplinary discharge planning process, led by the attending physician.  Recommendations may be updated based on patient status, additional functional criteria and insurance authorization.  Follow Up Recommendations SNF    Equipment Recommendations  Wheelchair (measurements PT);Wheelchair cushion (measurements PT);Hospital bed (hoyer lift)    Recommendations for Other Services       Precautions / Restrictions Precautions Precautions: Fall Precaution Comments: LUE contracture, developed over past 6 months per visitor Restrictions Weight Bearing Restrictions: No      Mobility  Bed Mobility Overal bed mobility: Needs Assistance Bed Mobility: Supine to Sit;Sit to Supine     Supine to sit: Total assist;HOB elevated Sit to supine: Total assist;HOB elevated        Transfers Overall transfer level: Needs  assistance Equipment used: None Transfers: Sit to/from Stand Sit to Stand: Total assist         General transfer comment: PT provides knee block and lifts pt into standing, no effort noted from patient  Ambulation/Gait                Stairs            Wheelchair Mobility    Modified Rankin (Stroke Patients Only)       Balance Overall balance assessment: Needs assistance Sitting-balance support: No upper extremity supported;Feet supported Sitting balance-Leahy Scale: Poor Sitting balance - Comments: minA initially, progresses to minG after initial minute of sitting   Standing balance support: No upper extremity supported Standing balance-Leahy Scale: Zero Standing balance comment: totalA                             Pertinent Vitals/Pain Pain Assessment: No/denies pain    Home Living Family/patient expects to be discharged to:: Skilled nursing facility Living Arrangements: Alone Available Help at Discharge:  (pt unable to state) Type of Home: Apartment Home Access: Level entry     Home Layout: One level Home Equipment: Cane - single point;Shower seat;Grab bars - tub/shower Additional Comments: history obtained from recent admission, pt unable to provide history at this time due to AMS and lethargy    Prior Function Level of Independence: Needs assistance   Gait / Transfers Assistance Needed: household ambulator with [facility] PRN  ADL's / Homemaking Assistance Needed: assisted by friend for community ADLs  Comments: PLOF recorded from recent admission, pt unable to report     Hand Dominance   Dominant Hand: Right    Extremity/Trunk Assessment  Upper Extremity Assessment Upper Extremity Assessment: Difficult to assess due to impaired cognition LUE Deficits / Details: 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 all digits contracted into flexion.    Lower Extremity Assessment Lower Extremity  Assessment: RLE deficits/detail;LLE deficits/detail RLE Deficits / Details: PROM WFL, PT observes no AROM during session LLE Deficits / Details: PROM WFL, PT observes no AROM during session    Cervical / Trunk Assessment Cervical / Trunk Assessment: Normal  Communication   Communication: Receptive difficulties;Expressive difficulties (minimally responsive this session)  Cognition Arousal/Alertness: Lethargic Behavior During Therapy: Flat affect Overall Cognitive Status: Impaired/Different from baseline Area of Impairment: Orientation;Attention;Memory;Following commands;Safety/judgement;Awareness;Problem solving                 Orientation Level:  (pt does not state last name, does not answer other orientation questions) Current Attention Level:  (lethargic)   Following Commands: Follows one step commands inconsistently Safety/Judgement: Decreased awareness of deficits;Decreased awareness of safety Awareness: Intellectual Problem Solving: Slow processing;Decreased initiation;Difficulty sequencing;Requires verbal cues;Requires tactile cues General Comments: pt does not follow any motor commands during session. Pt initially responds to history but responses become less consistent as evaluation continues. By end of session pt seldomly responds verbally to PT. Pt does open eyes with PT initiation of mobility and wth change in position      General Comments General comments (skin integrity, edema, etc.): VSS on RA    Exercises     Assessment/Plan    PT Assessment Patient needs continued PT services  PT Problem List Decreased strength;Decreased activity tolerance;Decreased range of motion;Decreased balance;Decreased mobility;Decreased coordination;Decreased cognition;Decreased knowledge of use of DME;Decreased safety awareness;Decreased knowledge of precautions;Impaired tone       PT Treatment Interventions DME instruction;Gait training;Functional mobility training;Therapeutic  activities;Therapeutic exercise;Balance training;Neuromuscular re-education;Cognitive remediation;Patient/family education;Wheelchair mobility training    PT Goals (Current goals can be found in the Care Plan section)  Acute Rehab PT Goals Patient Stated Goal: to improve cognition and mobility PT Goal Formulation: With family Time For Goal Achievement: 07/22/21 Potential to Achieve Goals: Fair    Frequency Min 2X/week   Barriers to discharge        Co-evaluation               AM-PAC PT "6 Clicks" Mobility  Outcome Measure Help needed turning from your back to your side while in a flat bed without using bedrails?: Total Help needed moving from lying on your back to sitting on the side of a flat bed without using bedrails?: Total Help needed moving to and from a bed to a chair (including a wheelchair)?: Total Help needed standing up from a chair using your arms (e.g., wheelchair or bedside chair)?: Total Help needed to walk in hospital room?: Total Help needed climbing 3-5 steps with a railing? : Total 6 Click Score: 6    End of Session   Activity Tolerance: Patient limited by lethargy Patient left: in bed;with call bell/phone within reach;with bed alarm set Nurse Communication: Mobility status;Need for lift equipment PT Visit Diagnosis: Other abnormalities of gait and mobility (R26.89);Muscle weakness (generalized) (M62.81);History of falling (Z91.81);Other symptoms and signs involving the nervous system (R29.898)    Time: 1696-7893 PT Time Calculation (min) (ACUTE ONLY): 25 min   Charges:   PT Evaluation $PT Eval Moderate Complexity: 1 Mod          Zenaida Niece, PT, DPT Acute Rehabilitation Pager: [phone]   Zenaida Niece 07/08/2021, 11:57 AM

## 2021-07-08 NOTE — Evaluation (Signed)
Clinical/Bedside Swallow Evaluation Patient Details  Name: Joanna [name] MRN: [id number] Date of Birth: 1951-03-22  Today's Date: 07/08/2021 Time: SLP Start Time (ACUTE ONLY): 4 SLP Stop Time (ACUTE ONLY): 1542 SLP Time Calculation (min) (ACUTE ONLY): 12 min  Past Medical History:  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 [facility]   Bilateral headaches    Depression    Diabetes mellitus    Diverticulosis    Dyslipidemia    GERD (gastroesophageal reflux disease)    Hypertension    Insomnia    Microproteinuria    Renal insufficiency 2010   Right carpal tunnel syndrome 12/24/2017   Sleep apnea    Sleep apnea 12/25/2017   Abnormal sleep study February 2019 CPAP ordered   Thyroid disease    hypothyroid   Past Surgical History:  Past Surgical History:  Procedure Laterality Date   ABDOMINAL HYSTERECTOMY  1984   BACK SURGERY     BILATERAL OOPHORECTOMY     BIOPSY  02/19/2020   Procedure: BIOPSY;  Surgeon: [name], MD;  Location: [facility];  Service: Endoscopy;;  gastric   CERVICAL DISC SURGERY  01/16/2019   COLONOSCOPY  01/29/12   Fields-2-3 hyperplastic polyps, mod internal hemorrhoids, diverticulosis throughout colon   COLONOSCOPY N/A 07/03/2014   Dr. Oneida Alar: Hyperplastic polyps   COLONOSCOPY N/A 06/06/2019   Dr. Oneida Alar: Diverticulosis, hemorrhoids, single polyp removed, pathology revealed lipoma.  Next colonoscopy 10 years   ESOPHAGOGASTRODUODENOSCOPY   01/29/2012   Fields-2-3cm sliding HH, fundic gland polyps, NEGATIVE H pylori   ESOPHAGOGASTRODUODENOSCOPY N/A 07/03/2014   Dr. Oneida Alar: gastritis and normal small bowel biopsies   ESOPHAGOGASTRODUODENOSCOPY N/A 02/19/2020   Fields: hypertrophic folds in the stomach. benign polyp removed   [facility] CAPSULE STUDY N/A 10/26/2014   SMALL BOWEL AVMs   KNEE ARTHROSCOPY Right    POLYPECTOMY  06/06/2019   Procedure: POLYPECTOMY;  Surgeon: [name], MD;   Location: [facility];  Service: Endoscopy;;   TOTAL ABDOMINAL HYSTERECTOMY W/ BILATERAL SALPINGOOPHORECTOMY     HPI:  70 y.o. female who presented from nursing home to ED with increasing confusion and fall. Pt found to be COVID-19+. Pt admitted for AKI and acute metabolic encephalopathy. Family/niece reported pt to have decreased oral intake for last few days prior to admission. CT head (9/22) revealed no acute intracranial pathology. Chest x ray (9/22) also negative for acute abnormalities. PMH: IIDM, CKD stage II, HTN, hypothyroidism, idiopathic left upper extremity spasm hyperplasia, chronic urinary incontinence, recent UTI.    Assessment / Plan / Recommendation  Clinical Impression  Pt very lethargic upon SLP arrival, however demonstrated eye opening with verbal greeting and movement of lips when given commands for oral mechanism examination. No obvious weakness/asymmetry observed, however unable to complete thorough examination due to pt's mentation. She orally accepted ice chips x2 when touched to lips and demonstrated slow, prolonged oral manipulation. Awareness of bolus across all trials quickly diminished and swallow not observed to be triggered for several moments despite lengthy multimodal cueing. Swallow was eventually triggered, though did not appear volitional. No overt s/sx of aspiration exhibited, however pt's aspiration risk remains high due to poor mentation. Given clinical presentation this date, recommend NPO. SLP will f/u for PO readiness.  SLP Visit Diagnosis: Dysphagia, unspecified (R13.10)    Aspiration Risk  Severe aspiration risk    Diet Recommendation NPO   Medication Administration: Via alternative means    Other  Recommendations Oral  Care Recommendations: Oral care QID;Staff/trained caregiver to provide oral care    Recommendations for follow up therapy are one component of a multi-disciplinary discharge planning process, led by the attending physician.   Recommendations may be updated based on patient status, additional functional criteria and insurance authorization.  Follow up Recommendations Other (comment) (TBD)      Frequency and Duration min 2x/week  2 weeks       Prognosis Prognosis for Safe Diet Advancement: Fair Barriers to Reach Goals: Cognitive deficits;Severity of deficits;Time post onset      Swallow Study   General Date of Onset: 07/07/21 HPI: 70 y.o. female who presented from nursing home to ED with increasing confusion and fall. Pt found to be COVID-19+. Pt admitted for AKI and acute metabolic encephalopathy. Family/niece reported pt to have decreased oral intake for last few days prior to admission. CT head (9/22) revealed no acute intracranial pathology. Chest x ray (9/22) also negative for acute abnormalities. PMH: IIDM, CKD stage II, HTN, hypothyroidism, idiopathic left upper extremity spasm hyperplasia, chronic urinary incontinence, recent UTI. Type of Study: Bedside Swallow Evaluation Previous Swallow Assessment: none per EMR Diet Prior to this Study: Regular;Thin liquids Temperature Spikes Noted: No Respiratory Status: Room air History of Recent Intubation: No Behavior/Cognition: Lethargic/Drowsy Oral Cavity Assessment: Other (comment) (dificult to assess) Oral Care Completed by SLP: No Oral Cavity - Dentition: Other (Comment) (difficult to assess) Vision:  (difficult to assess) Self-Feeding Abilities: Total assist Patient Positioning: Upright in bed;Postural control interferes with function Baseline Vocal Quality: Not observed Volitional Swallow: Unable to elicit    Oral/Motor/Sensory Function Overall Oral Motor/Sensory Function: Other (comment) (dificult to assess, however no obvious weakness/asymmetry noted)   Ice Chips Ice chips: Impaired Presentation: Spoon Oral Phase Impairments: Poor awareness of bolus;Impaired mastication;Reduced lingual movement/coordination Oral Phase Functional Implications:  Prolonged oral transit;Oral holding Pharyngeal Phase Impairments: Suspected delayed Swallow   Thin Liquid Thin Liquid: Not tested    Nectar Thick Nectar Thick Liquid: Not tested   Honey Thick Honey Thick Liquid: Not tested   Puree Puree: Not tested   Solid     Solid: Not tested     Ellwood Dense, [place], [place] Office Number: [phone]  Acie Fredrickson 07/08/2021,4:02 PM

## 2021-07-08 NOTE — Progress Notes (Signed)
Initial Nutrition Assessment  DOCUMENTATION CODES:  Not applicable  INTERVENTION:  Recommend liberalizing diet to regular.  Add Ensure Plus High Protein po BID, each supplement provides 350 kcal and 20 grams of protein.  Add Magic cup TID with meals, each supplement provides 290 kcal and 9 grams of protein.  Add MVI with minerals daily.  Obtain updated weight.  Encourage PO intake.  NUTRITION DIAGNOSIS:  Inadequate oral intake related to lethargy/confusion as evidenced by meal completion < 25%, per patient/family report.  GOAL:  Patient will meet greater than or equal to 90% of their needs  MONITOR:  PO intake, Supplement acceptance, Diet advancement, Labs, Weight trends, I & O's  REASON FOR ASSESSMENT:  Consult Calorie Count  ASSESSMENT:  70 yo female with a PMH of T2DM, CKD stage 2, HTN, hypothyroidism, idiopathic left upper extremity spasm hyperplasia, chronic urinary incontinence, and recent UTI came in for increasing confusion. Patient fell, but unsure if she remained conscious or not. Admitted with AKI and AMS. Found to be COVID+ on 9/23.  RD working remotely.  Family/niece reported patient appears to have decreased oral intake for last few days, per H&P.  Pt did not eat breakfast this morning per Epic - 0% intake documented.  Pt's weight appears to be trending down slowly per Epic. Pt needs new admission weight. RD to order.  Suspect that pt is malnourished, but RD cannot definitively diagnose at this time.  Recommend liberalizing diet to regular given that pt has AKI with normal lytes and A1c is normal. Secure chatted MD regarding this.  Also recommend adding Ensure BID, Magic Cup TID, and MVI with minerals.  Medications: reviewed; Pepcid, Synthroid, NaCl @ 100 ml/hr  Labs: reviewed; CBG 92-107 HbA1c: 5.1% (06/26/2021)  NUTRITION - FOCUSED PHYSICAL EXAM: Unable to perform - defer to in-person assessment  Diet Order:   Diet Order             Diet  regular Room service appropriate? Yes; Fluid consistency: Thin  Diet effective now                  EDUCATION NEEDS:  No education needs have been identified at this time  Skin:  Skin Assessment: Reviewed RN Assessment  Last BM:  PTA  Height:  Ht Readings from Last 1 Encounters:  06/24/21 5\' 2"  (1.575 m)   Weight:  Wt Readings from Last 1 Encounters:  06/24/21 55.2 kg   BMI:  There is no height or weight on file to calculate BMI.  Estimated Nutritional Needs:  Kcal:  1850-2050 Protein:  70-85 grams Fluid:  >1.85 L  Derrel Nip, RD, LDN (she/her/hers) Registered Dietitian I After-Hours/Weekend Pager # in [place]

## 2021-07-09 ENCOUNTER — Inpatient Hospital Stay (HOSPITAL_COMMUNITY): Payer: PPO

## 2021-07-09 DIAGNOSIS — N179 Acute kidney failure, unspecified: Secondary | ICD-10-CM | POA: Diagnosis not present

## 2021-07-09 LAB — COMPREHENSIVE METABOLIC PANEL
ALT: 17 U/L (ref 0–44)
AST: 27 U/L (ref 15–41)
Albumin: 3.2 g/dL — ABNORMAL LOW (ref 3.5–5.0)
Alkaline Phosphatase: 76 U/L (ref 38–126)
Anion gap: 11 (ref 5–15)
BUN: 26 mg/dL — ABNORMAL HIGH (ref 8–23)
CO2: 20 mmol/L — ABNORMAL LOW (ref 22–32)
Calcium: 10.3 mg/dL (ref 8.9–10.3)
Chloride: 107 mmol/L (ref 98–111)
Creatinine, Ser: 1.64 mg/dL — ABNORMAL HIGH (ref 0.44–1.00)
GFR, Estimated: 34 mL/min — ABNORMAL LOW (ref >60)
Glucose, Bld: 66 mg/dL — ABNORMAL LOW (ref 70–99)
Potassium: 4.1 mmol/L (ref 3.5–5.1)
Sodium: 138 mmol/L (ref 135–145)
Total Bilirubin: 0.6 mg/dL (ref 0.3–1.2)
Total Protein: 6.5 g/dL (ref 6.5–8.1)

## 2021-07-09 LAB — CBC
HCT: 31.1 % — ABNORMAL LOW (ref 36.0–46.0)
Hemoglobin: 9.6 g/dL — ABNORMAL LOW (ref 12.0–15.0)
MCH: 28.7 pg (ref 26.0–34.0)
MCHC: 30.9 g/dL (ref 30.0–36.0)
MCV: 93.1 fL (ref 80.0–100.0)
Platelets: 178 10*3/uL (ref 150–400)
RBC: 3.34 MIL/uL — ABNORMAL LOW (ref 3.87–5.11)
RDW: 13.3 % (ref 11.5–15.5)
WBC: 3.9 10*3/uL — ABNORMAL LOW (ref 4.0–10.5)
nRBC: 0 % (ref 0.0–0.2)

## 2021-07-09 LAB — AMMONIA: Ammonia: 15 umol/L (ref 9–35)

## 2021-07-09 LAB — GLUCOSE, CAPILLARY
Glucose-Capillary: 81 mg/dL (ref 70–99)
Glucose-Capillary: 110 mg/dL — ABNORMAL HIGH (ref 70–99)

## 2021-07-09 LAB — VITAMIN B12: Vitamin B-12: >7500 pg/mL — ABNORMAL HIGH (ref 180–914)

## 2021-07-09 LAB — MRSA NEXT GEN BY PCR, NASAL: MRSA by PCR Next Gen: NOT DETECTED

## 2021-07-09 LAB — MAGNESIUM: Magnesium: 1.9 mg/dL (ref 1.7–2.4)

## 2021-07-09 LAB — C-REACTIVE PROTEIN: CRP: 9.3 mg/dL — ABNORMAL HIGH (ref <1.0)

## 2021-07-09 MED ORDER — THIAMINE HCL 100 MG PO TABS
100.0000 mg | ORAL_TABLET | Freq: Every day | ORAL | Status: DC
  Administered 2021-07-10 – 2021-07-25 (×16): 100 mg via ORAL
  Filled 2021-07-09 (×16): qty 1

## 2021-07-09 MED ORDER — ACETAMINOPHEN 325 MG PO TABS
650.0000 mg | ORAL_TABLET | ORAL | Status: DC | PRN
  Administered 2021-07-11 – 2021-07-20 (×4): 650 mg via ORAL
  Filled 2021-07-09 (×4): qty 2

## 2021-07-09 MED ORDER — DEXTROSE-NACL 5-0.9 % IV SOLN: INTRAVENOUS | Status: DC

## 2021-07-09 MED ORDER — ACETAMINOPHEN 650 MG RE SUPP
650.0000 mg | RECTAL | Status: DC | PRN
  Administered 2021-07-09: 650 mg via RECTAL
  Filled 2021-07-09: qty 1

## 2021-07-09 MED ORDER — THIAMINE HCL 100 MG/ML IJ SOLN
100.0000 mg | Freq: Every day | INTRAMUSCULAR | Status: DC
  Administered 2021-07-09: 100 mg via INTRAVENOUS
  Filled 2021-07-09: qty 2

## 2021-07-09 NOTE — Progress Notes (Signed)
Pt VS abnormal BP 155/86, oral temp 103, axillary taken as well 103. RR 22, pt on RA 100%/ Lungs mildly diminished but clear. Pt very lethargic, not opening eyes unless very stimulated. Pt spoke once, delayed response. Denied pain. Blanket and sheet pulled down. Legs and arms cool to touch. Notified MD. Pt seen by SLP today. Pt is Covid +. Will continue to monitor.

## 2021-07-09 NOTE — Progress Notes (Signed)
Speech Language Pathology Treatment: Dysphagia  Patient Details Name: Joanna [name] MRN: [id number] DOB: 08-Jan-1951 Today's Date: 07/09/2021 Time: 7262-0355 SLP Time Calculation (min) (ACUTE ONLY): 19 min  Assessment / Plan / Recommendation Clinical Impression  F/u after yesterday's initial swallow evaluation. She was subsequently advanced to a regular diet. Today she is alert, delayed processing and response time.  RN reports no problems with swallowing today - just minimal appetite and intake. During session she drank water from a straw, had several bites of her Magic Cup and two grapes with adequate mastication (mildly prolonged but functional) and no s/s of aspiration with any consistencies. She declined further POs when offered.   Recommend continuing regular consistency diet to allow ample food choices; needs assistance with feeding.  No dysphagia present. No further SLP f/u is needed - our service will sign off.   HPI HPI: 70 y.o. female who presented from nursing home to ED with increasing confusion and fall. Pt found to be COVID-19+. Pt admitted for AKI and acute metabolic encephalopathy. Family/niece reported pt to have decreased oral intake for last few days prior to admission. CT head (9/22) revealed no acute intracranial pathology. Chest x ray (9/22) also negative for acute abnormalities. PMH: IIDM, CKD stage II, HTN, hypothyroidism, idiopathic left upper extremity spasm hyperplasia, chronic urinary incontinence, recent UTI.      SLP Plan  All goals met      Recommendations for follow up therapy are one component of a multi-disciplinary discharge planning process, led by the attending physician.  Recommendations may be updated based on patient status, additional functional criteria and insurance authorization.    Recommendations  Diet recommendations: Regular;Thin liquid Liquids provided via: Cup;Straw Medication Administration: Whole meds with puree Supervision: Staff to  assist with self feeding                Oral Care Recommendations: Oral care BID Follow up Recommendations: None SLP Visit Diagnosis: Dysphagia, unspecified (R13.10) Plan: All goals met       Orlandis Sanden L. Tivis Ringer, [place] CCC/SLP Acute Rehabilitation Services Office number [phone] Pager [phone]                 Juan Quam Laurice 07/09/2021, 3:05 PM

## 2021-07-09 NOTE — Progress Notes (Signed)
PROGRESS NOTE    Joanna [name]  ZOX:[id number] DOB: 07-Aug-1951 DOA: 07/07/2021 PCP: [name], MD   Chief Complaint  Patient presents with   Altered Mental Status    Brief Narrative/Hospital Course: 70 year old female with history of diabetes, CKD 2, hypertension, hypothyroidism, idiopathic left upper extremity spasm hyperplasia, chronic urinary incontinence recent UTI sent from the skilled nursing facility with confusion. Per report patient had decreased oral intake for last 2 days and patient also had a fall tonight before admission or she felt lightheaded before the episode does not remember whether she had lost consciousness or not. Family/niece at the bedside in the ED providing with history. Patient was found confused 9/22 morning by nursing home staff and sent to the ED for evaluation In the ED blood pressure was significantly elevated, patient somewhat confused, not agitated not hypoxic blood work showed AKI 2.2 from baseline 1.1, COVID-19 was positive CT head no acute finding chest x-ray unremarkable Ultrasound kidney small hyperechoic kidneys.  Patient was placed on IV fluids, and admitted  Subjective: Seen this morning daughter at the bedside patient is more alert awake answers questions appropriately but very weak She tells me she is in the hospital Patient's daughter  thinks she is getting better mentally.  Assessment & Plan:  Acute metabolic encephalopathy:multifactorial due to dehydration, COVID-19 infection.  CT head, MRI brain no acute finding -generalized age-related cerebral atrophy with moderately advanced chronic microvascular ischemic disease.No evidence of UTI.  Continue on supportive care, fall precaution, delirium precaution and encourage PT OT. Will do iv thiamine, check nh3, tsh is nl, check b12 level.  Fall/near syncope lightheadedness at the facility: Likely due to dehydration and AKI.  Cont IVF hydration, check orthostatic vitals. Recent echo 2 months  ago-in July EF 60-65% no R WMA  AKI on CKD 2 Baseline creatinine 1.1 on admission 2.2 , suspecting prerenal and improving on IV fluids-continue the same.  Encourage oral intake Recent Labs  Lab 07/07/21 1512 07/08/21 0125 07/09/21 0402  BUN 38* 31* 26*  CREATININE 2.24* 1.90* 1.64*   Hypercalcemia: likely from volume contraction AKI dehydration.  Calcium improving 11.1>10.6> 10.3 with ivf.   Hypoglycemia we will change fluid to dextrose containing ivf, encourage oral intake. Check am cortisol  COVID-19 positive:  symptomatic with weakness but not hypoxic . Cont on IV remdesivir, hold off on steroid;  discussed the plan of care with patient's brother over the phone Monitor inflammatory markers Recent Labs    07/08/21 1137 07/09/21 0402  DDIMER 1.20*  --   FERRITIN 912*  --   CRP 7.2* 9.3*   IDA: continue iron supplement  Hypertension: BP controlled- holding HCTZ and ARB due to AKI. On prn meds  Hypothyroidism: Euthyroid TSH 2.2, continue home Synthroid  Chronic left upper extremity spastic weakness Debility/deconditioning; Patient is unable to live independently PTOT  WJX:[id number] full code.  Continue on current full scope of treatment.  Discussed with patient's brother who states he is a POA, he states she has has son and daughter.  As per him At baseline she was living in the apartment ambulatory. Diet Order             Diet regular Room service appropriate? Yes; Fluid consistency: Thin  Diet effective now                 DVT prophylaxis: heparin injection 5,000 Units Start: 07/07/21 2200 Code Status:   Code Status: Full Code  Family Communication: plan of care discussed with  patient/s brother on the phone Prior to being admitted to skilled nursing facility she was living in apartment. Updated patient's daughter at the bedside 9/24  Status is: Inpatient Patient remains hospitalized and will need ongoing IV fluid hydration for management of her  hypercalcemia, AKI, encephalopathy.  Dispo: The patient is from: SNF              Anticipated d/c is to: SNF              Patient currently is not medically stable to d/c.   Difficult to place patient No  Objective: Vitals: Today's Vitals   07/09/21 0049 07/09/21 0500 07/09/21 0549 07/09/21 0813  BP:   (!) 138/100 (!) 137/91  Pulse:   91 (!) 101  Resp:   16 16  Temp: 99.7 F (37.6 C)  98.5 F (36.9 C) 98.7 F (37.1 C)  TempSrc: Oral  Oral Oral  SpO2:   99% 99%  Weight:  51.1 kg    PainSc:       Examination: General exam: AAOx 2-3, slow to respond, weak HEENT:Oral mucosa moist, Ear/Nose WNL grossly, dentition normal. Respiratory system: bilaterally clear breath sounds, no use of accessory muscle Cardiovascular system: S1 & S2 +, No JVD,. Gastrointestinal system: Abdomen soft, NT,ND, BS+ Nervous System:Alert, awake, slow to respond, spastic/contracted left upper extremity with mild baseline weakness on the left leg  Extremities: no edema, distal peripheral pulses palpable.  Skin: No rashes,no icterus. MSK: Normal muscle bulk,tone, power    Intake/Output Summary (Last 24 hours) at 07/09/2021 1100 Last data filed at 07/09/2021 0551 Gross per 24 hour  Intake 1765.74 ml  Output 1400 ml  Net 365.74 ml   Filed Weights   07/09/21 0500  Weight: 51.1 kg   Weight change:    Consultants:see note  Procedures:see note Antimicrobials: Anti-infectives (From admission, onward)    Start     Dose/Rate Route Frequency Ordered Stop   07/09/21 1000  remdesivir 100 mg in sodium chloride 0.9 % 100 mL IVPB       See Hyperspace for full Linked Orders Report.   100 mg 200 mL/hr over 30 Minutes Intravenous Daily 07/08/21 1040 07/11/21 0959   07/08/21 1130  remdesivir 200 mg in sodium chloride 0.9% 250 mL IVPB       See Hyperspace for full Linked Orders Report.   200 mg 580 mL/hr over 30 Minutes Intravenous Once 07/08/21 1040 07/08/21 1415      Culture/Microbiology    Component  Value Date/Time   SDES  06/24/2021 2139    URINE, CATHETERIZED Performed at [facility], [address] [place], [place] [address]    [facility]  06/24/2021 2139    NONE Performed at [facility], [address], [place], [place] [address]    CULT  06/24/2021 2139    NO GROWTH Performed at [facility] [address], [place], [place] [address]    REPTSTATUS 06/26/2021 FINAL 06/24/2021 2139    Other culture-see note  Unresulted Labs (From admission, onward)     Start     Ordered   07/09/21 0748  Vitamin B12  Add-on,   AD        07/09/21 0748   07/09/21 0500  Comprehensive metabolic panel  Daily,   R      07/08/21 1040   07/09/21 0500  C-reactive protein  Daily,   R      07/08/21 1040   07/09/21 0500  CBC  Daily,  R      07/08/21 1045          medications reviewed:  Scheduled Meds:  albuterol  2 puff Inhalation Q6H   vitamin C  500 mg Oral Daily   cyanocobalamin  1,000 mcg Subcutaneous Q0600   famotidine  20 mg Oral Daily   feeding supplement  237 mL Oral BID BM   heparin  5,000 Units Subcutaneous Q12H   levothyroxine  88 mcg Oral Q0600   loratadine  10 mg Oral Daily   multivitamin with minerals  1 tablet Oral Daily   rosuvastatin  20 mg Oral Daily   thiamine injection  100 mg Intravenous Q0600   topiramate  50 mg Oral BID   traZODone  25-50 mg Oral QHS   zinc sulfate  220 mg Oral Daily   Continuous Infusions:  sodium chloride 100 mL/hr at 07/09/21 0040   remdesivir 100 mg in NS 100 mL 100 mg (07/09/21 0947)     Intake/Output from previous day: 09/23 0701 - 09/24 0700 In: 2381.3 [P.O.:240; I.V.:1851.3; IV Piggyback:290] Out: 1400 [Urine:1400] Intake/Output this shift: No intake/output data recorded. Filed Weights   07/09/21 0500  Weight: 51.1 kg   Data Reviewed: I have personally reviewed following labs and imaging studies CBC: Recent Labs  Lab 07/07/21 1512 07/07/21 1618 07/08/21 0125  WBC 8.8  --  6.0  HGB 10.3* 10.5* 10.4*  HCT  33.7* 31.0* 34.1*  MCV 94.9  --  95.3  PLT 274  --  621   Basic Metabolic Panel: Recent Labs  Lab 07/07/21 1512 07/07/21 1618 07/08/21 0125 07/09/21 0402  NA 137 136 137 138  K 4.3 4.5 4.3 4.1  CL 103  --  105 107  CO2 22  --  22 20*  GLUCOSE 105*  --  89 66*  BUN 38*  --  31* 26*  CREATININE 2.24*  --  1.90* 1.64*  CALCIUM 11.1*  --  10.6* 10.3  MG 2.5*  --   --  1.9   GFR: Estimated Creatinine Clearance: 25.6 mL/min (A) (by C-G formula based on SCr of 1.64 mg/dL (H)). Liver Function Tests: Recent Labs  Lab 07/07/21 1512 07/09/21 0402  AST 16 27  ALT 17 17  ALKPHOS 79 76  BILITOT 0.5 0.6  PROT 6.6 6.5  ALBUMIN 3.4* 3.2*   No results for input(s): LIPASE, AMYLASE in the last 168 hours. Recent Labs  Lab 07/09/21 0756  AMMONIA 15   Coagulation Profile: No results for input(s): INR, PROTIME in the last 168 hours. Cardiac Enzymes: No results for input(s): CKTOTAL, CKMB, CKMBINDEX, TROPONINI in the last 168 hours. BNP (last 3 results) No results for input(s): PROBNP in the last 8760 hours. HbA1C: No results for input(s): HGBA1C in the last 72 hours. CBG: Recent Labs  Lab 07/07/21 1417 07/07/21 2139 07/08/21 0644 07/08/21 1131  GLUCAP 106* 105* 92 89   Lipid Profile: No results for input(s): CHOL, HDL, LDLCALC, TRIG, CHOLHDL, LDLDIRECT in the last 72 hours. Thyroid Function Tests: Recent Labs    07/07/21 1512  TSH 2.243   Anemia Panel: Recent Labs    07/08/21 1137  FERRITIN 912*   Sepsis Labs: No results for input(s): PROCALCITON, LATICACIDVEN in the last 168 hours.  Recent Results (from the past 240 hour(s))  Resp Panel by RT-PCR (Flu A&B, Covid) Nasopharyngeal Swab     Status: Abnormal   Collection Time: 07/07/21  5:23 PM   Specimen: Nasopharyngeal Swab; Nasopharyngeal(NP) swabs in vial transport  medium  Result Value Ref Range Status   SARS Coronavirus 2 by RT PCR POSITIVE (A) NEGATIVE Final    Comment: RESULT CALLED TO, READ BACK BY AND  VERIFIED WITH: MELVIN, L.,RN 07/07/2021 2035 BY PAW. (NOTE) SARS-CoV-2 target nucleic acids are DETECTED.  The SARS-CoV-2 RNA is generally detectable in upper respiratory specimens during the acute phase of infection. Positive results are indicative of the presence of the identified virus, but do not rule out bacterial infection or co-infection with other pathogens not detected by the test. Clinical correlation with patient history and other diagnostic information is necessary to determine patient infection status. The expected result is Negative.  Fact Sheet for Patients: EntrepreneurPulse.com.au  Fact Sheet for Healthcare Providers: IncredibleEmployment.be  This test is not yet approved or cleared by the [place] FDA and  has been authorized for detection and/or diagnosis of SARS-CoV-2 by FDA under an Emergency Use Authorization (EUA).  This EUA will remain in effect (meaning this test ca n be used) for the duration of  the COVID-19 declaration under Section 564(b)(1) of the Act, 21 U.S.C. section 360bbb-3(b)(1), unless the authorization is terminated or revoked sooner.     Influenza A by PCR NEGATIVE NEGATIVE Final   Influenza B by PCR NEGATIVE NEGATIVE Final    Comment: (NOTE) The Xpert Xpress SARS-CoV-2/FLU/RSV plus assay is intended as an aid in the diagnosis of influenza from Nasopharyngeal swab specimens and should not be used as a sole basis for treatment. Nasal washings and aspirates are unacceptable for Xpert Xpress SARS-CoV-2/FLU/RSV testing.  Fact Sheet for Patients: EntrepreneurPulse.com.au  Fact Sheet for Healthcare Providers: IncredibleEmployment.be  This test is not yet approved or cleared by the [place] FDA and has been authorized for detection and/or diagnosis of SARS-CoV-2 by FDA under an Emergency Use Authorization (EUA). This EUA will remain in effect (meaning this test  can be used) for the duration of the COVID-19 declaration under Section 564(b)(1) of the Act, 21 U.S.C. section 360bbb-3(b)(1), unless the authorization is terminated or revoked.  Performed at [facility], [place] [address] [place], [place] [address]   MRSA Next Gen by PCR, Nasal     Status: None   Collection Time: 07/09/21  1:39 AM   Specimen: Nasal Mucosa; Nasal Swab  Result Value Ref Range Status   MRSA by PCR Next Gen NOT DETECTED NOT DETECTED Final    Comment: (NOTE) The GeneXpert MRSA Assay (FDA approved for NASAL specimens only), is one component of a comprehensive MRSA colonization surveillance program. It is not intended to diagnose MRSA infection nor to guide or monitor treatment for MRSA infections. Test performance is not FDA approved in patients less than 58 years old. Performed at [facility], [place] [address] [place], [place] [address]      Radiology Studies: CT HEAD WO CONTRAST (5MM)  Result Date: 07/07/2021 CLINICAL DATA:  Altered mental status EXAM: CT HEAD WITHOUT CONTRAST TECHNIQUE: Contiguous axial images were obtained from the base of the skull through the vertex without intravenous contrast. COMPARISON:  Brain MRI 11/12/2019, CT head 06/25/2021 FINDINGS: Brain: There is no evidence of acute intracranial hemorrhage, extra-axial fluid collection, or acute infarct. Foci of hypodensity in the subcortical and periventricular white matter are again seen, likely reflecting sequela of chronic white matter microangiopathy. The ventricles are stable in size. There is no mass lesion. There is no midline shift. Vascular: There is calcification of the bilateral cavernous ICAs. Skull: Normal. Negative for fracture or focal lesion. Sinuses/Orbits:  The imaged paranasal sinuses are clear. Bilateral lens implants are in place. The globes and orbits are otherwise unremarkable. Other: None. IMPRESSION: No acute intracranial pathology. Electronically Signed   By: Valetta Mole  M.D.   On: 07/07/2021 16:29   MR BRAIN WO CONTRAST  Result Date: 07/08/2021 CLINICAL DATA:  Initial evaluation for delirium. EXAM: MRI HEAD WITHOUT CONTRAST TECHNIQUE: Multiplanar, multiecho pulse sequences of the brain and surrounding structures were obtained without intravenous contrast. COMPARISON:  Prior head CT from 07/07/2021. FINDINGS: Brain: Examination moderately degraded by motion artifact. Diffuse prominence of the CSF containing spaces compatible generalized cerebral atrophy. Patchy and confluent T2/FLAIR hyperintensity involving the periventricular and deep white matter both cerebral hemispheres consistent with chronic microvascular ischemic disease, moderately advanced in nature, and slightly more pronounced within the right cerebral hemispheres compared to the left. No abnormal foci of restricted diffusion to suggest acute or subacute ischemia. Gray-white matter differentiation maintained. No encephalomalacia to suggest chronic cortical infarction. No visible foci of susceptibility artifact to suggest acute or chronic intracranial hemorrhage. No mass lesion, midline shift or mass effect. No hydrocephalus or extra-axial fluid collection. Pituitary gland suprasellar region within normal limits. Midline structures intact. Vascular: Major intracranial vascular flow voids are maintained. Right vertebral artery somewhat dolichoectatic and partially invaginating upon the medulla. Skull and upper cervical spine: Craniocervical junction within normal limits. Postsurgical changes partially visualize within the upper cervical spine. Bone marrow signal intensity within normal limits. No scalp soft tissue abnormality. Sinuses/Orbits: Patient status post bilateral ocular lens replacement. Globes and orbital soft tissues demonstrate no acute finding. Paranasal sinuses are largely clear. No significant mastoid effusion. Other: None. IMPRESSION: 1. No acute intracranial abnormality. 2. Generalized age-related  cerebral atrophy with moderately advanced chronic microvascular ischemic disease. Electronically Signed   By: Jeannine Boga M.D.   On: 07/08/2021 23:18   US RENAL  Result Date: 07/07/2021 CLINICAL DATA:  Acute renal insufficiency EXAM: RENAL / URINARY TRACT ULTRASOUND COMPLETE COMPARISON:  04/05/2020 FINDINGS: Right Kidney: Renal measurements: 7.4 x 3.4 x 3.7 cm = volume: 51 mL. Increased renal echogenicity. No hydronephrosis. Left Kidney: Renal measurements: 6.8 x 3.5 x 3.4 cm = volume: 42.5 mL. Increased renal echogenicity. No hydronephrosis. Bladder: Partially decompressed. Other: None. IMPRESSION: No hydronephrosis. Small, hyperechoic kidneys, consistent with medical renal disease. Electronically Signed   By: Abigail Miyamoto M.D.   On: 07/07/2021 18:41   DG Chest Portable 1 View  Result Date: 07/07/2021 CLINICAL DATA:  Altered mental status EXAM: PORTABLE CHEST 1 VIEW COMPARISON:  06/24/2021 FINDINGS: Cardiac shadow is mildly prominent but stable. Aortic calcifications are again seen and stable. Postsurgical changes are noted in the cervical spine. Lungs are clear bilaterally. Previously seen radiopaque density over the mid chest is no longer identified and likely extrinsic to the patient. IMPRESSION: No acute abnormality noted. Electronically Signed   By: Inez Catalina M.D.   On: 07/07/2021 16:01     LOS: 1 day   Antonieta Pert, MD [facility]  07/09/2021, 11:00 AM

## 2021-07-09 NOTE — Progress Notes (Addendum)
HOSPITAL MEDICINE OVERNIGHT EVENT NOTE    Notified by nursing that patient's is exhibiting an elevated mews score.  Patient is extremely lethargic and is unable to vocalize any particular complaints.  Chart reviewed, patient is currently being worked up for a multifactorial metabolic encephalopathy and is additionally being managed for acute kidney injury as well as COVID-19 infection without concurrent hypoxia.  Patient is exhibiting fever of 43 F and has been persisting for approximate the past hour and a half.  Patient is currently tachycardic with heart rates in the 120s.  Patient is currently in sinus tachycardia.  Patient is hemodynamically stable and has been hypertensive all day.  Patient is additionally tachypneic with review of CBC this morning revealing leukopenia.  Exhibiting such impressive persisting fevers is concerning for another source of developing infection.  We will obtain repeat urinalysis, 2 sets of blood cultures, lactic acid, procalcitonin, chest x-ray.  Will administer Tylenol for the fever.  Nursing to follow closely and will update me once these tests have been resulted and if patient clinically changes.  Vernelle Emerald  MD [facility]   ADDENDUM 9/25 4:30AM  Nursing notes that patient's vitamin B12 level performed yesterday is greater than 7500.  Nursing asking if patient needs vitamin B12 injection this morning that is currently ordered, will discontinue.  Sherryll Burger Wylma Tatem

## 2021-07-09 NOTE — Evaluation (Signed)
Occupational Therapy Evaluation Patient Details Name: Joanna [name] MRN: [id number] DOB: June 19, 1951 Today's Date: 07/09/2021   History of Present Illness 70 y.o. female presents to [facility] h[facility] on 07/07/2021 with increasing confusion and fall. Pt found to be COVID-19+ in ED. Pt admitted for AKI and acute metabolic encephalopathy. PMH includes IIDM, CKD stage II, HTN, hypothyroidism, idiopathic left upper extremity spasm hyperplasia,, chronic urinary incontinence, recent UTI.   Clinical Impression   Patient admitted for the diagnosis above.  Limited ADL assessment completed.  The patient remains somnolent and unable to participate safely with out of bed assessment.  OT will follow in the acute setting to progress goals as LOA improves.  SNF is recommended for post acute rehab as she needs near total assist for all self care and mobility.        Recommendations for follow up therapy are one component of a multi-disciplinary discharge planning process, led by the attending physician.  Recommendations may be updated based on patient status, additional functional criteria and insurance authorization.   Follow Up Recommendations  SNF    Equipment Recommendations  Wheelchair (measurements OT);Wheelchair cushion (measurements OT)    Recommendations for Other Services       Precautions / Restrictions Precautions Precautions: Fall Precaution Comments: LUE contracture, continued decreased LOA Restrictions Weight Bearing Restrictions: No      Mobility Bed Mobility Overal bed mobility: Needs Assistance Bed Mobility: Rolling;Supine to Sit;Sit to Supine Rolling: Total assist   Supine to sit: Total assist;HOB elevated Sit to supine: Total assist;HOB elevated   General bed mobility comments: patient unable to support herself in sitting, not safe to transfer to chair, returned to supine    Transfers                      Balance Overall balance assessment: Needs  assistance Sitting-balance support: No upper extremity supported;Feet supported Sitting balance-Leahy Scale: Poor                                     ADL either performed or assessed with clinical judgement   ADL       Grooming: Wash/dry hands;Wash/dry face;Total assistance;Bed level                                                           Pertinent Vitals/Pain Pain Assessment: Faces Faces Pain Scale: No hurt Pain Intervention(s): Monitored during session     Hand Dominance Right   Extremity/Trunk Assessment Upper Extremity Assessment Upper Extremity Assessment: LUE deficits/detail LUE Deficits / Details: 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  Wash cloth placed as hand roll to prevent nails from digging into palm LUE: Unable to fully assess due to immobilization   Lower Extremity Assessment Lower Extremity Assessment: Defer to PT evaluation       Communication     Cognition Arousal/Alertness: Lethargic Behavior During Therapy: Flat affect Overall Cognitive Status: No family/caregiver present to determine baseline cognitive functioning                         Following Commands: Follows one step commands inconsistently     Problem Solving: Decreased initiation  Home Living Family/patient expects to be discharged to:: Skilled nursing facility Living Arrangements: Alone Available Help at Discharge: Family;Available PRN/intermittently Type of Home: Apartment Home Access: Level entry     Home Layout: One level     Bathroom Shower/Tub: Teacher, early years/pre: Standard Bathroom Accessibility: Yes   Home Equipment: Cane - single point;Shower seat;Grab bars - tub/shower   Additional Comments: Continued: history obtained from recent admission, pt unable to provide history at this time due to AMS and lethargy.      Prior Functioning/Environment          Comments: Patient  unable to report of PLOF due to decreased LOA        OT Problem List: Decreased activity tolerance;Impaired balance (sitting and/or standing);Decreased strength;Decreased cognition;Decreased safety awareness      OT Treatment/Interventions: Self-care/ADL training;Therapeutic activities;Energy conservation;Patient/family education;Balance training;DME and/or AE instruction;Neuromuscular education;Therapeutic exercise    OT Goals(Current goals can be found in the care plan section) Acute Rehab OT Goals OT Goal Formulation: Patient unable to participate in goal setting Time For Goal Achievement: 07/23/21 Potential to Achieve Goals: Good ADL Goals Pt Will Perform Eating: with set-up;sitting Pt Will Perform Grooming: with set-up;sitting Additional ADL Goal #1: Patient will supine to sit with Min a of one to increase independence with toileting.  OT Frequency: Min 2X/week   Barriers to D/C: Decreased caregiver support          Co-evaluation              AM-PAC OT "6 Clicks" Daily Activity     Outcome Measure Help from another person eating meals?: Total Help from another person taking care of personal grooming?: Total Help from another person toileting, which includes using toliet, bedpan, or urinal?: Total Help from another person bathing (including washing, rinsing, drying)?: Total Help from another person to put on and taking off regular upper body clothing?: Total Help from another person to put on and taking off regular lower body clothing?: Total 6 Click Score: 6   End of Session Nurse Communication: Need for lift equipment  Activity Tolerance: Patient limited by lethargy Patient left: in bed;with call bell/phone within reach;with bed alarm set  OT Visit Diagnosis: Muscle weakness (generalized) (M62.81);Unsteadiness on feet (R26.81);Other symptoms and signs involving cognitive function;Feeding difficulties (R63.3)                Time: 6195-0932 OT Time Calculation  (min): 22 min Charges:  OT General Charges $OT Visit: 1 Visit OT Evaluation $OT Eval Moderate Complexity: 1 Mod  07/09/2021  RP, OTR/L  Acute Rehabilitation Services  Office:  [phone]   Metta Clines 07/09/2021, 12:46 PM

## 2021-07-09 NOTE — NC FL2 (Signed)
[place] MEDICAID FL2 LEVEL OF CARE SCREENING TOOL     IDENTIFICATION  Patient Name: Joanna [name] Birthdate: [birth year] Sex: female Admission Date (Current Location): 07/07/2021  [facility] and [place] Number:  [employer] and Address:  [facility] [place], [place] [address] [place], [place] [address]      Provider Number: [id number]  Attending Physician Name and Address:  Antonieta Pert, MD  Relative Name and Phone Number:       Current Level of Care: Hospital Recommended Level of Care: [place] Prior Approval Number:    Date Approved/Denied:   PASRR Number: [id number]  Discharge Plan: SNF    Current Diagnoses: Patient Active Problem List   Diagnosis Date Noted   Altered mental status    Acute renal failure superimposed on stage 4 chronic kidney disease ([place]) 06/26/2021   Dehydration 06/25/2021   AKI (acute kidney injury) ([place]) 06/25/2021   Fall at home, initial encounter 06/25/2021   Hypernatremia 06/25/2021   Dizziness and giddiness 04/19/2021   Hypomagnesemia 04/19/2021   Hypokalemia 04/19/2021   Near syncope 04/19/2021   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 ([place]) 01/19/2021   Non-recurrent acute suppurative otitis media of left ear without spontaneous rupture of tympanic membrane 01/06/2021   Seasonal allergic rhinitis due to pollen 01/06/2021   Abnormal CT scan, stomach 02/06/2020   Coronary artery disease due to lipid rich plaque 11/19/2019   Aortic atherosclerosis ([name]) 11/19/2019   Constipation 10/06/2019   AVM (arteriovenous malformation) of small bowel, acquired    IDA (iron deficiency anemia) 12/03/2018   Sleep apnea 12/25/2017   Right carpal tunnel syndrome 12/24/2017   Chronic anxiety 02/13/2017   Right rotator cuff tendinitis 02/11/2015   Iron deficiency 08/25/2014   Microcytic anemia 06/17/2014   Insomnia 05/04/2014   Hypothyroidism 06/05/2013   Renal insufficiency  06/05/2013   Essential hypertension, benign 06/05/2013   Stress fracture of right foot 02/27/2013   Chronic radicular lumbar pain 02/27/2013   Hyperlipidemia 01/16/2013   Type 2 diabetes mellitus with stage 4 chronic kidney disease, without long-term current use of insulin ([place]) 01/16/2013    Orientation RESPIRATION BLADDER Height & Weight      (unable to assess, delayed responses)  Normal Incontinent, External catheter Weight: 112 lb 10.5 oz (51.1 kg) Height:     BEHAVIORAL SYMPTOMS/MOOD NEUROLOGICAL BOWEL NUTRITION STATUS      Incontinent Diet (see dc summary)  AMBULATORY STATUS COMMUNICATION OF NEEDS Skin   Extensive Assist Verbally Normal                       Personal Care Assistance Level of Assistance  Bathing, Feeding, Dressing Bathing Assistance: Maximum assistance Feeding assistance: Maximum assistance Dressing Assistance: Maximum assistance     Functional Limitations Info  Sight, Hearing, Speech Sight Info: Adequate Hearing Info: Adequate Speech Info: Adequate    SPECIAL CARE FACTORS FREQUENCY  PT (By licensed PT), OT (By licensed OT)     PT Frequency: 5x/week OT Frequency: 5x/week            Contractures Contractures Info: Not present    Additional Factors Info  Code Status, Allergies, Isolation Precautions Code Status Info: Full Allergies Info: Zoloft (Sertraline Hcl), Ivp Dye (Iodinated Diagnostic Agents)     Isolation Precautions Info: COVID+ on 07/07/21     Current Medications (07/09/2021):  This is the current hospital active medication list Current Facility-Administered Medications  Medication Dose Route Frequency  Provider Last Rate Last Admin   0.9 %  sodium chloride infusion   Intravenous Continuous Kc, Ramesh, MD 100 mL/hr at 07/09/21 0040 Restarted at 07/09/21 0040   acetaminophen (TYLENOL) tablet 1,000 mg  1,000 mg Oral Q8H PRN Wynetta Fines T, MD       albuterol (PROVENTIL) (2.5 MG/3ML) 0.083% nebulizer solution 3 mL  3 mL Inhalation  Q4H PRN Wynetta Fines T, MD       albuterol (VENTOLIN HFA) 108 (90 Base) MCG/ACT inhaler 2 puff  2 puff Inhalation Q6H Kc, [place], MD   2 puff at 07/09/21 0949   ascorbic acid (VITAMIN C) tablet 500 mg  500 mg Oral Daily Kc, Ramesh, MD   500 mg at 07/09/21 0942   cyanocobalamin ((VITAMIN B-12)) injection 1,000 mcg  1,000 mcg Subcutaneous Q0600 Antonieta Pert, MD   1,000 mcg at 07/09/21 0516   diphenhydrAMINE (BENADRYL) capsule 25 mg  25 mg Oral Q8H PRN Wynetta Fines T, MD       famotidine (PEPCID) tablet 20 mg  20 mg Oral Daily Wynetta Fines T, MD   20 mg at 07/09/21 0941   feeding supplement (ENSURE ENLIVE / ENSURE PLUS) liquid 237 mL  237 mL Oral BID BM Kc, Ramesh, MD       heparin injection 5,000 Units  5,000 Units Subcutaneous Q12H Wynetta Fines T, MD   5,000 Units at 07/09/21 2956   hydrALAZINE (APRESOLINE) tablet 25 mg  25 mg Oral Q6H PRN Wynetta Fines T, MD   25 mg at 07/08/21 2121   levothyroxine (SYNTHROID) tablet 88 mcg  88 mcg Oral Q0600 Wynetta Fines T, MD   88 mcg at 07/09/21 0515   loratadine (CLARITIN) tablet 10 mg  10 mg Oral Daily Wynetta Fines T, MD   10 mg at 07/09/21 0941   multivitamin with minerals tablet 1 tablet  1 tablet Oral Daily Kc, [place]aren [place], MD   1 tablet at 07/09/21 0941   ondansetron (ZOFRAN) tablet 4 mg  4 mg Oral Q6H PRN Wynetta Fines T, MD       Or   ondansetron [facility]) injection 4 mg  4 mg Intravenous Q6H PRN Wynetta Fines T, MD       remdesivir 100 mg in sodium chloride 0.9 % 100 mL IVPB  100 mg Intravenous Daily Kc, Ramesh, MD 200 mL/hr at 07/09/21 0947 100 mg at 07/09/21 0947   rosuvastatin (CRESTOR) tablet 20 mg  20 mg Oral Daily Wynetta Fines T, MD   20 mg at 07/09/21 0942   thiamine (B-1) injection 100 mg  100 mg Intravenous Q0600 Antonieta Pert, MD   100 mg at 07/09/21 0938   topiramate (TOPAMAX) tablet 50 mg  50 mg Oral BID Wynetta Fines T, MD   50 mg at 07/08/21 2121   traZODone (DESYREL) tablet 25-50 mg  25-50 mg Oral QHS Wynetta Fines T, MD   50 mg at 07/08/21 2108   zinc sulfate  capsule 220 mg  220 mg Oral Daily Antonieta Pert, MD   220 mg at 07/09/21 2130     Discharge Medications: Please see discharge summary for a list of discharge medications.  Relevant Imaging Results:  Relevant Lab Results:   Additional Information SSN: [id number]  [place], [place]

## 2021-07-10 ENCOUNTER — Encounter (HOSPITAL_COMMUNITY): Payer: Self-pay | Admitting: Internal Medicine

## 2021-07-10 DIAGNOSIS — N179 Acute kidney failure, unspecified: Secondary | ICD-10-CM | POA: Diagnosis not present

## 2021-07-10 LAB — COMPREHENSIVE METABOLIC PANEL
ALT: 16 U/L (ref 0–44)
AST: 26 U/L (ref 15–41)
Albumin: 2.8 g/dL — ABNORMAL LOW (ref 3.5–5.0)
Alkaline Phosphatase: 62 U/L (ref 38–126)
Anion gap: 10 (ref 5–15)
BUN: 27 mg/dL — ABNORMAL HIGH (ref 8–23)
CO2: 18 mmol/L — ABNORMAL LOW (ref 22–32)
Calcium: 9.7 mg/dL (ref 8.9–10.3)
Chloride: 107 mmol/L (ref 98–111)
Creatinine, Ser: 1.64 mg/dL — ABNORMAL HIGH (ref 0.44–1.00)
GFR, Estimated: 34 mL/min — ABNORMAL LOW (ref >60)
Glucose, Bld: 130 mg/dL — ABNORMAL HIGH (ref 70–99)
Potassium: 3.6 mmol/L (ref 3.5–5.1)
Sodium: 135 mmol/L (ref 135–145)
Total Bilirubin: 0.5 mg/dL (ref 0.3–1.2)
Total Protein: 5.5 g/dL — ABNORMAL LOW (ref 6.5–8.1)

## 2021-07-10 LAB — CBC
HCT: 30.8 % — ABNORMAL LOW (ref 36.0–46.0)
Hemoglobin: 9.9 g/dL — ABNORMAL LOW (ref 12.0–15.0)
MCH: 29.5 pg (ref 26.0–34.0)
MCHC: 32.1 g/dL (ref 30.0–36.0)
MCV: 91.7 fL (ref 80.0–100.0)
Platelets: 164 10*3/uL (ref 150–400)
RBC: 3.36 MIL/uL — ABNORMAL LOW (ref 3.87–5.11)
RDW: 13.4 % (ref 11.5–15.5)
WBC: 9.8 10*3/uL (ref 4.0–10.5)
nRBC: 0 % (ref 0.0–0.2)

## 2021-07-10 LAB — C-REACTIVE PROTEIN: CRP: 9.7 mg/dL — ABNORMAL HIGH (ref <1.0)

## 2021-07-10 LAB — GLUCOSE, CAPILLARY
Glucose-Capillary: 110 mg/dL — ABNORMAL HIGH (ref 70–99)
Glucose-Capillary: 111 mg/dL — ABNORMAL HIGH (ref 70–99)
Glucose-Capillary: 108 mg/dL — ABNORMAL HIGH (ref 70–99)

## 2021-07-10 LAB — PROCALCITONIN: Procalcitonin: 1.8 ng/mL

## 2021-07-10 LAB — LACTIC ACID, PLASMA: Lactic Acid, Venous: 1 mmol/L (ref 0.5–1.9)

## 2021-07-10 MED ORDER — TOPIRAMATE 25 MG PO TABS
50.0000 mg | ORAL_TABLET | Freq: Two times a day (BID) | ORAL | Status: DC
  Administered 2021-07-10 – 2021-07-25 (×30): 50 mg via ORAL
  Filled 2021-07-10 (×31): qty 2

## 2021-07-10 MED ORDER — PIPERACILLIN-TAZOBACTAM 3.375 G IVPB
3.3750 g | Freq: Three times a day (TID) | INTRAVENOUS | Status: DC
  Administered 2021-07-10 – 2021-07-14 (×11): 3.375 g via INTRAVENOUS
  Filled 2021-07-10 (×13): qty 50

## 2021-07-10 MED ORDER — ALBUTEROL SULFATE HFA 108 (90 BASE) MCG/ACT IN AERS
2.0000 | INHALATION_SPRAY | Freq: Four times a day (QID) | RESPIRATORY_TRACT | Status: DC | PRN
  Filled 2021-07-10: qty 6.7

## 2021-07-10 MED ORDER — AZITHROMYCIN 500 MG PO TABS
500.0000 mg | ORAL_TABLET | Freq: Every day | ORAL | Status: AC
  Administered 2021-07-10 – 2021-07-12 (×3): 500 mg via ORAL
  Filled 2021-07-10 (×3): qty 1

## 2021-07-10 MED ORDER — TOPIRAMATE 25 MG PO TABS: 25.0000 mg | ORAL_TABLET | Freq: Two times a day (BID) | ORAL | Status: DC

## 2021-07-10 NOTE — Plan of Care (Signed)
  Problem: Nutrition: Goal: Adequate nutrition will be maintained Outcome: Progressing   Problem: Skin Integrity: Goal: Risk for impaired skin integrity will decrease Outcome: Progressing   Problem: Nutrition: Goal: Adequate nutrition will be maintained Outcome: Progressing   Problem: Skin Integrity: Goal: Risk for impaired skin integrity will decrease Outcome: Progressing

## 2021-07-10 NOTE — Progress Notes (Signed)
Pharmacy Antibiotic Note  Joanna [name] is a 70 y.o. female admitted on 07/07/2021 with decreased oral intake and lightheadedness found to be COVID positive. Patient is on day 2 of remdesivir. The patient had a temperature spike of 103 9/24 PM of unclear source with increased WBC. CXR shows signs of PNA with concerns for aspiration/CAP.  Pharmacy has been consulted for zosyn dosing.  Plan: Zosyn 3.375g IV q8h (4 hour infusion). Azithromycin 500 mg IV x 3 doses  F/U LOT, Scr, de-escalation, blood cultures   Weight: 51.4 kg (113 lb 5.1 oz)  Temp (24hrs), Avg:100.8 F (38.2 C), Min:97.5 F (36.4 C), Max:103 F (39.4 C)  Recent Labs  Lab 07/07/21 1512 07/08/21 0125 07/09/21 0402 07/09/21 1132 07/10/21 0303  WBC 8.8 6.0  --  3.9* 9.8  CREATININE 2.24* 1.90* 1.64*  --  1.64*  LATICACIDVEN  --   --   --   --  1.0    Estimated Creatinine Clearance: 25.6 mL/min (A) (by C-G formula based on SCr of 1.64 mg/dL (H)).    Allergies  Allergen Reactions   Zoloft [Sertraline Hcl] Other (See Comments)    Made the patient feel withdrawn and had bad dreams- "Allergic," per MAR   Ivp Dye [Iodinated Diagnostic Agents] Rash and Other (See Comments)    "Allergic," per [facility]    Antimicrobials this admission: Remdesivir 9/24>>9/26 Azithromycin 9/25>>(9/28) Zosyn 9/25>>  Microbiology results: 9/25 BCx: ngtd 9/24 MRSA PCR: not detected   Thank you for allowing pharmacy to be a part of this patient's care.  Adria Dill, PharmD PGY-1 Acute Care Resident  07/10/2021 10:04 AM

## 2021-07-10 NOTE — Progress Notes (Addendum)
Pt responded to tylenol. VS 136/87, HR 117, temp 98.9, RR 24, O2 100% RA. Pt more awake, alert. Continues to deny pain. Pt denies feeling SOB but did have 1 episode of congested cough @ 00010. Pt drank 1 cup of tea through straw-no s/s coughing @ 0110.  Awaiting Lab draw and urine for UA. Pt pericare performed and purewick/canister/tubing changed.   0630: Pt is more awake, alert, talking. Still awaiting UA from urine sample. Pt had pulled out purewick with iv previously. Oncoming RN aware of need for UA.

## 2021-07-10 NOTE — Plan of Care (Signed)
  Problem: Clinical Measurements: Goal: Diagnostic test results will improve Outcome: Progressing   Problem: Clinical Measurements: Goal: Respiratory complications will improve Outcome: Progressing   Problem: Nutrition: Goal: Adequate nutrition will be maintained Outcome: Progressing   Problem: Pain Managment: Goal: General experience of comfort will improve Outcome: Progressing   Problem: Skin Integrity: Goal: Risk for impaired skin integrity will decrease Outcome: Progressing   Problem: Respiratory: Goal: Will maintain a patent airway Outcome: Progressing   Problem: Respiratory: Goal: Complications related to the disease process, condition or treatment will be avoided or minimized Outcome: Progressing

## 2021-07-10 NOTE — Progress Notes (Signed)
PROGRESS NOTE    Joanna [name]  NLG:[id number] DOB: [birth year] DOA: 07/07/2021 PCP: [name], MD   Chief Complaint  Patient presents with   Altered Mental Status    Brief Narrative/Hospital Course: 70 year old female with history of diabetes, CKD 2, hypertension, hypothyroidism, idiopathic left upper extremity spasm hyperplasia, chronic urinary incontinence recent UTI sent from the skilled nursing facility with confusion. Per report patient had decreased oral intake for last 2 days and patient also had a fall tonight before admission or she felt lightheaded before the episode does not remember whether she had lost consciousness or not. Family/niece at the bedside in the ED providing with history. Patient was found confused 9/22 morning by nursing home staff and sent to the ED for evaluation In the ED blood pressure was significantly elevated, patient somewhat confused, not agitated not hypoxic blood work showed AKI 2.2 from baseline 1.1, COVID-19 was positive CT head no acute finding chest x-ray unremarkable Ultrasound kidney small hyperechoic kidneys.  Patient was placed on IV fluids, and admitted  Subjective: Temp spike 103 overnight and was very lethargic-infectious work-up was initiated Seen and examined this morning  She is much more alert awake oriented to place, people self follows commands but appears weak stil On room air, having some coughs  Assessment & Plan:  Acute metabolic encephalopathy:multifactorial due to dehydration, COVID-19 infection.  CT head, MRI brain no acute finding -generalized age-related cerebral atrophy with moderately advanced chronic microvascular ischemic disease.No evidence of UTI in the ED.  Overnight was very lethargic but this morning alert awake and appears to be improving.  Continue on fall precaution delirium precaution, PT level is high her home B12 supplement discontinued .  Continue with delirium precaution, fall precaution, cont thiamine.   Cannot  Fall/near syncope lightheadedness at the facility: Likely due to dehydration and AKI.  Continue with IV normal hydration, check orthostatic vitals. Recent echo 2 months ago-in July EF 60-65% no R WMA  AKI on CKD 2 Baseline creatinine 1.1 on admission 2.2 , suspecting prerenal and creatinine stable at 1.6 encourage oral hydration, continue IV fluids.   Recent Labs  Lab 07/07/21 1512 07/08/21 0125 07/09/21 0402 07/10/21 0303  BUN 38* 31* 26* 27*  CREATININE 2.24* 1.90* 1.64* 1.64*    Hypercalcemia: likely from volume contraction AKI dehydration.  Calcium improving 11.1>10.6> 10.3? 9.7 with ivf.   Hypoglycemia contIVF with dextrose containing ivf, encourage oral intake. Check am cortisol  COVID-19 positive:  Patient was symptomatic with generalized weakness, continue IV remdesivir.  She is not hypoxic so steroids not needed.  Continue bronchodilators supportive care encourage ambulation.   Recent Labs    07/08/21 1137 07/09/21 0402 07/10/21 0303  DDIMER 1.20*  --   --   FERRITIN 912*  --   --   CRP 7.2* 9.3* 9.7*   Right infrahilar pneumonia Fever : temperature spike 103 on 9/24 night, unclear source.  Chest x-ray shows right infrahilar airspace disease suspicious for pneumonia, blood cultures were ordered and in process, procalcitonin elevated 1.8-we will start iv antibiotics for pneumonia.  Speech eval to rule out aspiration.  IDA: continue iron supplement.  Hemoglobin overall stable Recent Labs  Lab 07/07/21 1512 07/07/21 1618 07/08/21 0125 07/09/21 1132 07/10/21 0303  HGB 10.3* 10.5* 10.4* 9.6* 9.9*  HCT 33.7* 31.0* 34.1* 31.1* 30.8*     Hypertension: BP stable.  Continue to hold HCTZ and ARB due to AKI. On prn meds  Hypothyroidism: Euthyroid TSH 2.2, continue home Synthroid  Chronic  left upper extremity spastic weakness Debility/deconditioning; Patient is unable to live independently PTOT and a skilled nursing facility placement  VFI:[id number]  full code.  Continue on current full scope of treatment.  Discussed with patient's brother who states he is a POA, he states she has has son and daughter.  As per him At baseline she was living in the apartment ambulatory.   Diet Order             Diet regular Room service appropriate? Yes; Fluid consistency: Thin  Diet effective now                 DVT prophylaxis: heparin injection 5,000 Units Start: 07/07/21 2200 Code Status:   Code Status: Full Code  Family Communication: plan of care discussed with patient/s brother on the phone Prior to being admitted to skilled nursing facility she was living in apartment. Updated patient's daughter at the bedside 9/24  Status is: Inpatient Patient remains hospitalized and will need ongoing IV fluid hydration for management of her hypercalcemia, AKI, encephalopathy.  Dispo: The patient is from: SNF              Anticipated d/c is to: SNF              Patient currently is not medically stable to d/c.   Difficult to place patient No  Objective: Vitals: Today's Vitals   07/10/21 0103 07/10/21 0110 07/10/21 0515 07/10/21 0715  BP:  136/87 108/77   Pulse:  (!) 117 99   Resp:  (!) 24 (!) 22   Temp: 99.9 F (37.7 C) 98.9 F (37.2 C) 98 F (36.7 C)   TempSrc: Axillary Axillary Axillary   SpO2:  100% 97%   Weight:      PainSc:  0-No pain 0-No pain Asleep   Examination: General exam: AAOx2-3,older than stated age, weak appearing. HEENT:Oral mucosa moist, Ear/Nose WNL grossly, dentition normal. Respiratory system: bilaterally diminished breath sounds at the base, no use of accessory muscle Cardiovascular system: S1 & S2 +, No JVD,. Gastrointestinal system: Abdomen soft, NT,ND, BS+ Nervous System:Alert, awake, spasticity on the left upper extremity, weakness on left side more than right which is chronic  Extremities: no edema, distal peripheral pulses palpable.  Skin: No rashes,no icterus. MSK: Normal muscle bulk,tone, power      Intake/Output Summary (Last 24 hours) at 07/10/2021 0834 Last data filed at 07/10/2021 0757 Gross per 24 hour  Intake 1617 ml  Output 1250 ml  Net 367 ml    Filed Weights   07/09/21 0500 07/09/21 2358  Weight: 51.1 kg 51.4 kg   Weight change: 0.3 kg   Consultants:see note  Procedures:see note Antimicrobials: Anti-infectives (From admission, onward)    Start     Dose/Rate Route Frequency Ordered Stop   07/09/21 1000  remdesivir 100 mg in sodium chloride 0.9 % 100 mL IVPB       See Hyperspace for full Linked Orders Report.   100 mg 200 mL/hr over 30 Minutes Intravenous Daily 07/08/21 1040 07/11/21 0959   07/08/21 1130  remdesivir 200 mg in sodium chloride 0.9% 250 mL IVPB       See Hyperspace for full Linked Orders Report.   200 mg 580 mL/hr over 30 Minutes Intravenous Once 07/08/21 1040 07/08/21 1415      Culture/Microbiology    Component Value Date/Time   SDES  06/24/2021 2139    URINE, CATHETERIZED Performed at [facility], [address], [place], [place] [address]  SPECREQUEST  06/24/2021 2139    NONE Performed at [facility], [address], [place], [place] [address]    CULT  06/24/2021 2139    NO GROWTH Performed at [facility] [address], [place], [place] [address]    REPTSTATUS 06/26/2021 FINAL 06/24/2021 2139    Other culture-see note  Unresulted Labs (From admission, onward)     Start     Ordered   07/09/21 2330  Urinalysis, Complete w Microscopic Urine, Clean Catch  Once,   R        07/09/21 2329   07/09/21 2329  Culture, blood (routine x 2)  BLOOD CULTURE X 2,   R (with TIMED occurrences)      07/09/21 2329   07/09/21 0500  Comprehensive metabolic panel  Daily,   R      07/08/21 1040   07/09/21 0500  C-reactive protein  Daily,   R      07/08/21 1040   07/09/21 0500  CBC  Daily,   R      07/08/21 1045          medications reviewed:  Scheduled Meds:  albuterol  2 puff Inhalation Q6H   vitamin C  500 mg Oral Daily    famotidine  20 mg Oral Daily   feeding supplement  237 mL Oral BID BM   heparin  5,000 Units Subcutaneous Q12H   levothyroxine  88 mcg Oral Q0600   loratadine  10 mg Oral Daily   multivitamin with minerals  1 tablet Oral Daily   rosuvastatin  20 mg Oral Daily   thiamine  100 mg Oral Daily   topiramate  50 mg Oral BID   traZODone  25-50 mg Oral QHS   zinc sulfate  220 mg Oral Daily   Continuous Infusions:  dextrose 5 % and 0.9% NaCl 75 mL/hr at 07/10/21 0757   remdesivir 100 mg in NS 100 mL Stopped (07/09/21 1017)     Intake/Output from previous day: 09/24 0701 - 09/25 0700 In: 1333.8 [P.O.:577; I.V.:756.8] Out: 1250 [Urine:1250] Intake/Output this shift: Total I/O In: 343.2 [I.V.:243.2; IV Piggyback:100] Out: -  Filed Weights   07/09/21 0500 07/09/21 2358  Weight: 51.1 kg 51.4 kg   Data Reviewed: I have personally reviewed following labs and imaging studies CBC: Recent Labs  Lab 07/07/21 1512 07/07/21 1618 07/08/21 0125 07/09/21 1132 07/10/21 0303  WBC 8.8  --  6.0 3.9* 9.8  HGB 10.3* 10.5* 10.4* 9.6* 9.9*  HCT 33.7* 31.0* 34.1* 31.1* 30.8*  MCV 94.9  --  95.3 93.1 91.7  PLT 274  --  164 178 569    Basic Metabolic Panel: Recent Labs  Lab 07/07/21 1512 07/07/21 1618 07/08/21 0125 07/09/21 0402 07/10/21 0303  NA 137 136 137 138 135  K 4.3 4.5 4.3 4.1 3.6  CL 103  --  105 107 107  CO2 22  --  22 20* 18*  GLUCOSE 105*  --  89 66* 130*  BUN 38*  --  31* 26* 27*  CREATININE 2.24*  --  1.90* 1.64* 1.64*  CALCIUM 11.1*  --  10.6* 10.3 9.7  MG 2.5*  --   --  1.9  --     GFR: Estimated Creatinine Clearance: 25.6 mL/min (A) (by C-G formula based on SCr of 1.64 mg/dL (H)). Liver Function Tests: Recent Labs  Lab 07/07/21 1512 07/09/21 0402 07/10/21 0303  AST 16 27 26   ALT 17 17 16   ALKPHOS 79  76 62  BILITOT 0.5 0.6 0.5  PROT 6.6 6.5 5.5*  ALBUMIN 3.4* 3.2* 2.8*    No results for input(s): LIPASE, AMYLASE in the last 168 hours. Recent Labs  Lab  07/09/21 0756  AMMONIA 15    Coagulation Profile: No results for input(s): INR, PROTIME in the last 168 hours. Cardiac Enzymes: No results for input(s): CKTOTAL, CKMB, CKMBINDEX, TROPONINI in the last 168 hours. BNP (last 3 results) No results for input(s): PROBNP in the last 8760 hours. HbA1C: No results for input(s): HGBA1C in the last 72 hours. CBG: Recent Labs  Lab 07/08/21 0644 07/08/21 1131 07/09/21 1252 07/09/21 2248 07/10/21 0536  GLUCAP 92 89 81 110* 110*    Lipid Profile: No results for input(s): CHOL, HDL, LDLCALC, TRIG, CHOLHDL, LDLDIRECT in the last 72 hours. Thyroid Function Tests: Recent Labs    07/07/21 1512  TSH 2.243    Anemia Panel: Recent Labs    07/08/21 1137 07/09/21 0756  VITAMINB12  --  >7,500*  FERRITIN 912*  --     Sepsis Labs: Recent Labs  Lab 07/10/21 0303  PROCALCITON 1.80  LATICACIDVEN 1.0    Recent Results (from the past 240 hour(s))  Resp Panel by RT-PCR (Flu A&B, Covid) Nasopharyngeal Swab     Status: Abnormal   Collection Time: 07/07/21  5:23 PM   Specimen: Nasopharyngeal Swab; Nasopharyngeal(NP) swabs in vial transport medium  Result Value Ref Range Status   SARS Coronavirus 2 by RT PCR POSITIVE (A) NEGATIVE Final    Comment: RESULT CALLED TO, READ BACK BY AND VERIFIED WITH: MELVIN, L.,RN 07/07/2021 2035 BY PAW. (NOTE) SARS-CoV-2 target nucleic acids are DETECTED.  The SARS-CoV-2 RNA is generally detectable in upper respiratory specimens during the acute phase of infection. Positive results are indicative of the presence of the identified virus, but do not rule out bacterial infection or co-infection with other pathogens not detected by the test. Clinical correlation with patient history and other diagnostic information is necessary to determine patient infection status. The expected result is Negative.  Fact Sheet for Patients: EntrepreneurPulse.com.au  Fact Sheet for Healthcare  Providers: IncredibleEmployment.be  This test is not yet approved or cleared by the [place] FDA and  has been authorized for detection and/or diagnosis of SARS-CoV-2 by FDA under an Emergency Use Authorization (EUA).  This EUA will remain in effect (meaning this test ca n be used) for the duration of  the COVID-19 declaration under Section 564(b)(1) of the Act, 21 U.S.C. section 360bbb-3(b)(1), unless the authorization is terminated or revoked sooner.     Influenza A by PCR NEGATIVE NEGATIVE Final   Influenza B by PCR NEGATIVE NEGATIVE Final    Comment: (NOTE) The Xpert Xpress SARS-CoV-2/FLU/RSV plus assay is intended as an aid in the diagnosis of influenza from Nasopharyngeal swab specimens and should not be used as a sole basis for treatment. Nasal washings and aspirates are unacceptable for Xpert Xpress SARS-CoV-2/FLU/RSV testing.  Fact Sheet for Patients: EntrepreneurPulse.com.au  Fact Sheet for Healthcare Providers: IncredibleEmployment.be  This test is not yet approved or cleared by the [place] FDA and has been authorized for detection and/or diagnosis of SARS-CoV-2 by FDA under an Emergency Use Authorization (EUA). This EUA will remain in effect (meaning this test can be used) for the duration of the COVID-19 declaration under Section 564(b)(1) of the Act, 21 U.S.C. section 360bbb-3(b)(1), unless the authorization is terminated or revoked.  Performed at [facility], [place] [address], [place], [place] [address]  MRSA Next Gen by PCR, Nasal     Status: None   Collection Time: 07/09/21  1:39 AM   Specimen: Nasal Mucosa; Nasal Swab  Result Value Ref Range Status   MRSA by PCR Next Gen NOT DETECTED NOT DETECTED Final    Comment: (NOTE) The GeneXpert MRSA Assay (FDA approved for NASAL specimens only), is one component of a comprehensive MRSA colonization surveillance program. It is not intended to  diagnose MRSA infection nor to guide or monitor treatment for MRSA infections. Test performance is not FDA approved in patients less than 63 years old. Performed at [facility], [place] [address], [place], [place] [address]       Radiology Studies: MR BRAIN WO CONTRAST  Result Date: 07/08/2021 CLINICAL DATA:  Initial evaluation for delirium. EXAM: MRI HEAD WITHOUT CONTRAST TECHNIQUE: Multiplanar, multiecho pulse sequences of the brain and surrounding structures were obtained without intravenous contrast. COMPARISON:  Prior head CT from 07/07/2021. FINDINGS: Brain: Examination moderately degraded by motion artifact. Diffuse prominence of the CSF containing spaces compatible generalized cerebral atrophy. Patchy and confluent T2/FLAIR hyperintensity involving the periventricular and deep white matter both cerebral hemispheres consistent with chronic microvascular ischemic disease, moderately advanced in nature, and slightly more pronounced within the right cerebral hemispheres compared to the left. No abnormal foci of restricted diffusion to suggest acute or subacute ischemia. Gray-white matter differentiation maintained. No encephalomalacia to suggest chronic cortical infarction. No visible foci of susceptibility artifact to suggest acute or chronic intracranial hemorrhage. No mass lesion, midline shift or mass effect. No hydrocephalus or extra-axial fluid collection. Pituitary gland suprasellar region within normal limits. Midline structures intact. Vascular: Major intracranial vascular flow voids are maintained. Right vertebral artery somewhat dolichoectatic and partially invaginating upon the medulla. Skull and upper cervical spine: Craniocervical junction within normal limits. Postsurgical changes partially visualize within the upper cervical spine. Bone marrow signal intensity within normal limits. No scalp soft tissue abnormality. Sinuses/Orbits: Patient status post bilateral ocular lens  replacement. Globes and orbital soft tissues demonstrate no acute finding. Paranasal sinuses are largely clear. No significant mastoid effusion. Other: None. IMPRESSION: 1. No acute intracranial abnormality. 2. Generalized age-related cerebral atrophy with moderately advanced chronic microvascular ischemic disease. Electronically Signed   By: Jeannine Boga M.D.   On: 07/08/2021 23:18   DG Chest Port 1 View  Result Date: 07/09/2021 CLINICAL DATA:  Systemic inflammatory response EXAM: PORTABLE CHEST 1 VIEW COMPARISON:  07/07/2021 FINDINGS: Interval development of right infrahilar airspace disease. No pleural effusion. Stable cardiomediastinal silhouette with aortic atherosclerosis. No pneumothorax. Hardware in the cervical spine IMPRESSION: Interval development of right infrahilar airspace disease, suspicious for pneumonia. Electronically Signed   By: Donavan Foil M.D.   On: 07/09/2021 23:52     LOS: 2 days   Antonieta Pert, MD [facility]  07/10/2021, 8:34 AM

## 2021-07-11 ENCOUNTER — Inpatient Hospital Stay: Payer: PPO | Admitting: Family Medicine

## 2021-07-11 DIAGNOSIS — N179 Acute kidney failure, unspecified: Secondary | ICD-10-CM | POA: Diagnosis not present

## 2021-07-11 LAB — CBC
HCT: 24.9 % — ABNORMAL LOW (ref 36.0–46.0)
Hemoglobin: 7.7 g/dL — ABNORMAL LOW (ref 12.0–15.0)
MCH: 28.8 pg (ref 26.0–34.0)
MCHC: 30.9 g/dL (ref 30.0–36.0)
MCV: 93.3 fL (ref 80.0–100.0)
Platelets: 151 10*3/uL (ref 150–400)
RBC: 2.67 MIL/uL — ABNORMAL LOW (ref 3.87–5.11)
RDW: 13.5 % (ref 11.5–15.5)
WBC: 3.6 10*3/uL — ABNORMAL LOW (ref 4.0–10.5)
nRBC: 0 % (ref 0.0–0.2)

## 2021-07-11 LAB — BASIC METABOLIC PANEL
Anion gap: 7 (ref 5–15)
BUN: 31 mg/dL — ABNORMAL HIGH (ref 8–23)
CO2: 19 mmol/L — ABNORMAL LOW (ref 22–32)
Calcium: 9.2 mg/dL (ref 8.9–10.3)
Chloride: 114 mmol/L — ABNORMAL HIGH (ref 98–111)
Creatinine, Ser: 1.76 mg/dL — ABNORMAL HIGH (ref 0.44–1.00)
GFR, Estimated: 31 mL/min — ABNORMAL LOW (ref >60)
Glucose, Bld: 100 mg/dL — ABNORMAL HIGH (ref 70–99)
Potassium: 3.7 mmol/L (ref 3.5–5.1)
Sodium: 140 mmol/L (ref 135–145)

## 2021-07-11 LAB — GLUCOSE, CAPILLARY
Glucose-Capillary: 130 mg/dL — ABNORMAL HIGH (ref 70–99)
Glucose-Capillary: 133 mg/dL — ABNORMAL HIGH (ref 70–99)

## 2021-07-11 MED ORDER — SALINE SPRAY 0.65 % NA SOLN
1.0000 | NASAL | Status: DC | PRN
  Filled 2021-07-11: qty 44

## 2021-07-11 MED ORDER — FERROUS SULFATE 325 (65 FE) MG PO TABS
325.0000 mg | ORAL_TABLET | Freq: Every day | ORAL | Status: DC
  Administered 2021-07-11 – 2021-07-24 (×14): 325 mg via ORAL
  Filled 2021-07-11 (×12): qty 1

## 2021-07-11 NOTE — Progress Notes (Signed)
This chaplain responded to a chaplain referral for Advance Directive education.    The chaplain understands from the Pt. RN-Ren and the [facility], the Pt. is reviewing the Pt. AD education.  On Tuesday, the Pt. And family with the assistance of the RN will page spiritual care for continued AD education.  The chaplain informed the Pt. RN notarizing the AD in the hospital will not be possible as long as the Pt. has contact precautions.

## 2021-07-11 NOTE — Plan of Care (Signed)
  Problem: Clinical Measurements: Goal: Will remain free from infection Outcome: Progressing   Problem: Nutrition: Goal: Adequate nutrition will be maintained Outcome: Progressing   Problem: Skin Integrity: Goal: Risk for impaired skin integrity will decrease Outcome: Progressing

## 2021-07-11 NOTE — Progress Notes (Signed)
PROGRESS NOTE    Joanna [name]  AQT:[id number] DOB: April 26, 1951 DOA: 07/07/2021 PCP: [name], MD   Chief Complaint  Patient presents with   Altered Mental Status    Brief Narrative/Hospital Course: 70 year old female with history of diabetes, CKD 2, hypertension, hypothyroidism, idiopathic left upper extremity spasm hyperplasia, chronic urinary incontinence recent UTI sent from the skilled nursing facility with confusion. Per report patient had decreased oral intake for last 2 days and patient also had a fall tonight before admission or she felt lightheaded before the episode does not remember whether she had lost consciousness or not. Family/niece at the bedside in the ED providing with history. Patient was found confused 9/22 morning by nursing home staff and sent to the ED for evaluation In the ED blood pressure was significantly elevated, patient somewhat confused, not agitated not hypoxic blood work showed AKI 2.2 from baseline 1.1, COVID-19 was positive CT head no acute finding chest x-ray unremarkable Ultrasound kidney small hyperechoic kidneys.  Patient was placed on IV fluids, and admitted 9/24 Temp spike 103 overnight-and chest x-ray concerning for pneumonia  Subjective: Overnight complains of nasal congestion.  This morning she is much more alert awake, getting labs drawn.No recurrence of fever.  Assessment & Plan:  Acute metabolic encephalopathy:multifactorial due to dehydration, COVID-19 infection.  CT head, MRI brain no acute finding -generalized age-related cerebral atrophy with moderately advanced chronic microvascular ischemic disease.No evidence of UTI in the ED. mental status slowly improving continue IV fluid hydration PT OT supportive care delirium precaution.   Fall/near syncope lightheadedness at the facility: Likely due to dehydration and AKI.  Continue with IV normal hydration, encourage oral intake. Recent echo 2 months ago-in July EF 60-65% no R WMA  AKI  on CKD 2 Baseline creatinine 1.1 on admission 2.2 , suspecting prerenal and creatinine stable at 1.6 encourage oral hydration, continue IV fluids.  Labs pending-came in with creatinine 1.7 Recent Labs  Lab 07/07/21 1512 07/08/21 0125 07/09/21 0402 07/10/21 0303 07/11/21 0856  BUN 38* 31* 26* 27* 31*  CREATININE 2.24* 1.90* 1.64* 1.64* 1.76*   Hypercalcemia: likely from volume contraction AKI dehydration.  Calcium improving 11.1>10.6> 10.3? 9.7 with ivf.   Hypoglycemia resolved.  contIVF with dextrose containing ivf, encourage oral intake. Check am cortisol Recent Labs  Lab 07/09/21 1252 07/09/21 2248 07/10/21 0536 07/10/21 1156 07/10/21 2224  GLUCAP 81 110* 110* 111* 108*    COVID-19 positive:  Patient was symptomatic with generalized weakness-complete course of remdesivir, no need for steroids as not hypoxic.  CRP elevated d dimer marginal. Recent Labs    07/09/21 0402 07/10/21 0303  CRP 9.3* 9.7*  Right infrahilar pneumonia Fever  9/24 night:  Chest x-ray shows right infrahilar airspace disease suspicious for pneumonia, blood cultures pending, procalcitonin elevated 1.8-started on empiric antibiotic coverage 9/25.  SLP evaluation to rule out aspiration.  IDA: continue iron supplement.  Hb downtrending this morning transfuse if less than 7 g.  Recheck in a.m. Recent Labs  Lab 07/07/21 1618 07/08/21 0125 07/09/21 1132 07/10/21 0303 07/11/21 0856  HGB 10.5* 10.4* 9.6* 9.9* 7.7*  HCT 31.0* 34.1* 31.1* 30.8* 24.9*    Hypertension: BP well controlled. continue to hold HCTZ and ARB due to AKI. On prn meds  Hypothyroidism: Euthyroid TSH 2.2, continue home Synthroid  Chronic left upper extremity spastic weakness Debility/deconditioning; Patient is unable to live independently Continue PTOT and a skilled nursing facility placement  TGY:BWLSLHTDS full code.  Continue on current full scope of treatment.  Discussed with patient's brother who states he is a POA, he  states she has has son and daughter.  As per him At baseline she was living in the apartment ambulatory.   Diet Order             Diet regular Room service appropriate? Yes; Fluid consistency: Thin  Diet effective now                 DVT prophylaxis: heparin injection 5,000 Units Start: 07/07/21 2200 Code Status:   Code Status: Full Code  Family Communication: plan of care discussed with patient/s brother on the phone Prior to being admitted to skilled nursing facility she was living in apartment. Updated patient's daughter at the bedside previously  Status is: Inpatient Patient remains hospitalized and will need ongoing IV fluid hydration for management of her hypercalcemia, AKI, encephalopathy.  Dispo: The patient is from: SNF              Anticipated d/c is to: SNF              Patient currently is not medically stable to d/c.   Difficult to place patient No  Objective: Vitals: Today's Vitals   07/10/21 2154 07/11/21 0351 07/11/21 0800 07/11/21 1008  BP: 119/84   119/87  Pulse: (!) 102   84  Resp: 20   16  Temp: 98.6 F (37 C)   98 F (36.7 C)  TempSrc: Oral   Oral  SpO2: 98%   96%  Weight:  52.7 kg    PainSc: 0-No pain  0-No pain    Examination: General exam: Much more alert awake and oriented, ill looking, frail older than stated age, weak appearing. HEENT:Oral mucosa moist, Ear/Nose WNL grossly, dentition normal. Respiratory system: bilaterally diminished at the base, no use of accessory muscle Cardiovascular system: S1 & S2 +, No JVD,. Gastrointestinal system: Abdomen soft, NT,ND, BS+ Nervous System:Alert, awake, weakness on the left side with spasticity left arm  Extremities: no edema, distal peripheral pulses palpable.  Skin: No rashes,no icterus. MSK: Normal muscle bulk,tone, power    Intake/Output Summary (Last 24 hours) at 07/11/2021 1138 Last data filed at 07/11/2021 1016 Gross per 24 hour  Intake 2466.91 ml  Output 950 ml  Net 1516.91 ml   Filed  Weights   07/09/21 0500 07/09/21 2358 07/11/21 0351  Weight: 51.1 kg 51.4 kg 52.7 kg   Weight change: 1.3 kg   Consultants:see note  Procedures:see note Antimicrobials: Anti-infectives (From admission, onward)    Start     Dose/Rate Route Frequency Ordered Stop   07/10/21 1045  piperacillin-tazobactam (ZOSYN) IVPB 3.375 g        3.375 g 12.5 mL/hr over 240 Minutes Intravenous Every 8 hours 07/10/21 0954     07/10/21 1000  azithromycin (ZITHROMAX) tablet 500 mg        500 mg Oral Daily 07/10/21 0842 07/13/21 0959   07/09/21 1000  remdesivir 100 mg in sodium chloride 0.9 % 100 mL IVPB       See Hyperspace for full Linked Orders Report.   100 mg 200 mL/hr over 30 Minutes Intravenous Daily 07/08/21 1040 07/10/21 1014   07/08/21 1130  remdesivir 200 mg in sodium chloride 0.9% 250 mL IVPB       See Hyperspace for full Linked Orders Report.   200 mg 580 mL/hr over 30 Minutes Intravenous Once 07/08/21 1040 07/08/21 1415      Culture/Microbiology    Component Value Date/Time  SDES  06/24/2021 2139    URINE, CATHETERIZED Performed at [facility], [address] [place], [place] [address]    [facility]  06/24/2021 2139    NONE Performed at [facility], [address] [place], [place] [address]    CULT  06/24/2021 2139    NO GROWTH Performed at [place] [address] [place], [place] [address]    REPTSTATUS 06/26/2021 FINAL 06/24/2021 2139    Other culture-see note  Unresulted Labs (From admission, onward)     Start     Ordered   07/12/21 2951  Basic metabolic panel  Daily,   R     Question:  Specimen collection method  Answer:  Lab=Lab collect   07/11/21 0759   07/12/21 0500  CBC  Daily,   R     Question:  Specimen collection method  Answer:  Lab=Lab collect   07/11/21 0759   07/09/21 2330  Urinalysis, Complete w Microscopic Urine, Clean Catch  Once,   R        07/09/21 2329   07/09/21 2329  Culture, blood (routine x 2)  BLOOD CULTURE X 2,   R       07/09/21 2329   07/09/21 0500  C-reactive protein  Daily,   R      07/08/21 1040          medications reviewed:  Scheduled Meds:  vitamin C  500 mg Oral Daily   azithromycin  500 mg Oral Daily   famotidine  20 mg Oral Daily   feeding supplement  237 mL Oral BID BM   heparin  5,000 Units Subcutaneous Q12H   levothyroxine  88 mcg Oral Q0600   loratadine  10 mg Oral Daily   multivitamin with minerals  1 tablet Oral Daily   rosuvastatin  20 mg Oral Daily   thiamine  100 mg Oral Daily   topiramate  50 mg Oral BID   traZODone  25-50 mg Oral QHS   zinc sulfate  220 mg Oral Daily   Continuous Infusions:  dextrose 5 % and 0.9% NaCl 75 mL/hr at 07/11/21 1016   piperacillin-tazobactam (ZOSYN)  IV 12.5 mL/hr at 07/11/21 1016     Intake/Output from previous day: 09/25 0701 - 09/26 0700 In: 2673.2 [P.O.:1136; I.V.:1162.5; IV Piggyback:374.7] Out: 950 [Urine:950] Intake/Output this shift: Total I/O In: 613 [P.O.:30; I.V.:320; OACZY:606; IV Piggyback:26] Out: -  Filed Weights   07/09/21 0500 07/09/21 2358 07/11/21 0351  Weight: 51.1 kg 51.4 kg 52.7 kg   Data Reviewed: I have personally reviewed following labs and imaging studies CBC: Recent Labs  Lab 07/07/21 1512 07/07/21 1618 07/08/21 0125 07/09/21 1132 07/10/21 0303 07/11/21 0856  WBC 8.8  --  6.0 3.9* 9.8 3.6*  HGB 10.3* 10.5* 10.4* 9.6* 9.9* 7.7*  HCT 33.7* 31.0* 34.1* 31.1* 30.8* 24.9*  MCV 94.9  --  95.3 93.1 91.7 93.3  PLT 274  --  164 178 164 301   Basic Metabolic Panel: Recent Labs  Lab 07/07/21 1512 07/07/21 1618 07/08/21 0125 07/09/21 0402 07/10/21 0303 07/11/21 0856  NA 137 136 137 138 135 140  K 4.3 4.5 4.3 4.1 3.6 3.7  CL 103  --  105 107 107 114*  CO2 22  --  22 20* 18* 19*  GLUCOSE 105*  --  89 66* 130* 100*  BUN 38*  --  31* 26* 27* 31*  CREATININE 2.24*  --  1.90* 1.64* 1.64* 1.76*  CALCIUM 11.1*  --  10.6* 10.3 9.7 9.2  MG 2.5*  --   --  1.9  --   --    GFR: Estimated Creatinine  Clearance: 23.9 mL/min (A) (by C-G formula based on SCr of 1.76 mg/dL (H)). Liver Function Tests: Recent Labs  Lab 07/07/21 1512 07/09/21 0402 07/10/21 0303  AST 16 27 26   ALT 17 17 16   ALKPHOS 79 76 62  BILITOT 0.5 0.6 0.5  PROT 6.6 6.5 5.5*  ALBUMIN 3.4* 3.2* 2.8*   No results for input(s): LIPASE, AMYLASE in the last 168 hours. Recent Labs  Lab 07/09/21 0756  AMMONIA 15   Coagulation Profile: No results for input(s): INR, PROTIME in the last 168 hours. Cardiac Enzymes: No results for input(s): CKTOTAL, CKMB, CKMBINDEX, TROPONINI in the last 168 hours. BNP (last 3 results) No results for input(s): PROBNP in the last 8760 hours. HbA1C: No results for input(s): HGBA1C in the last 72 hours. CBG: Recent Labs  Lab 07/09/21 1252 07/09/21 2248 07/10/21 0536 07/10/21 1156 07/10/21 2224  GLUCAP 81 110* 110* 111* 108*   Lipid Profile: No results for input(s): CHOL, HDL, LDLCALC, TRIG, CHOLHDL, LDLDIRECT in the last 72 hours. Thyroid Function Tests: No results for input(s): TSH, T4TOTAL, FREET4, T3FREE, THYROIDAB in the last 72 hours.  Anemia Panel: Recent Labs    07/09/21 0756  VITAMINB12 >7,500*   Sepsis Labs: Recent Labs  Lab 07/10/21 0303  PROCALCITON 1.80  LATICACIDVEN 1.0    Recent Results (from the past 240 hour(s))  Resp Panel by RT-PCR (Flu A&B, Covid) Nasopharyngeal Swab     Status: Abnormal   Collection Time: 07/07/21  5:23 PM   Specimen: Nasopharyngeal Swab; Nasopharyngeal(NP) swabs in vial transport medium  Result Value Ref Range Status   SARS Coronavirus 2 by RT PCR POSITIVE (A) NEGATIVE Final    Comment: RESULT CALLED TO, READ BACK BY AND VERIFIED WITH: MELVIN, L.,RN 07/07/2021 2035 BY PAW. (NOTE) SARS-CoV-2 target nucleic acids are DETECTED.  The SARS-CoV-2 RNA is generally detectable in upper respiratory specimens during the acute phase of infection. Positive results are indicative of the presence of the identified virus, but do not  rule out bacterial infection or co-infection with other pathogens not detected by the test. Clinical correlation with patient history and other diagnostic information is necessary to determine patient infection status. The expected result is Negative.  Fact Sheet for Patients: EntrepreneurPulse.com.au  Fact Sheet for Healthcare Providers: IncredibleEmployment.be  This test is not yet approved or cleared by the [place] FDA and  has been authorized for detection and/or diagnosis of SARS-CoV-2 by FDA under an Emergency Use Authorization (EUA).  This EUA will remain in effect (meaning this test ca n be used) for the duration of  the COVID-19 declaration under Section 564(b)(1) of the Act, 21 U.S.C. section 360bbb-3(b)(1), unless the authorization is terminated or revoked sooner.     Influenza A by PCR NEGATIVE NEGATIVE Final   Influenza B by PCR NEGATIVE NEGATIVE Final    Comment: (NOTE) The Xpert Xpress SARS-CoV-2/FLU/RSV plus assay is intended as an aid in the diagnosis of influenza from Nasopharyngeal swab specimens and should not be used as a sole basis for treatment. Nasal washings and aspirates are unacceptable for Xpert Xpress SARS-CoV-2/FLU/RSV testing.  Fact Sheet for Patients: EntrepreneurPulse.com.au  Fact Sheet for Healthcare Providers: IncredibleEmployment.be  This test is not yet approved or cleared by the [place] FDA and has been authorized for detection and/or diagnosis of SARS-CoV-2 by FDA under an Emergency Use Authorization (  EUA). This EUA will remain in effect (meaning this test can be used) for the duration of the COVID-19 declaration under Section 564(b)(1) of the Act, 21 U.S.C. section 360bbb-3(b)(1), unless the authorization is terminated or revoked.  Performed at [facility], [place] [address], [place], [place] [address]   MRSA Next Gen by PCR, Nasal     Status:  None   Collection Time: 07/09/21  1:39 AM   Specimen: Nasal Mucosa; Nasal Swab  Result Value Ref Range Status   MRSA by PCR Next Gen NOT DETECTED NOT DETECTED Final    Comment: (NOTE) The GeneXpert MRSA Assay (FDA approved for NASAL specimens only), is one component of a comprehensive MRSA colonization surveillance program. It is not intended to diagnose MRSA infection nor to guide or monitor treatment for MRSA infections. Test performance is not FDA approved in patients less than 57 years old. Performed at [facility], [place] [address] [place], [place] [address]       Radiology Studies: DG Chest Port 1 View  Result Date: 07/09/2021 CLINICAL DATA:  Systemic inflammatory response EXAM: PORTABLE CHEST 1 VIEW COMPARISON:  07/07/2021 FINDINGS: Interval development of right infrahilar airspace disease. No pleural effusion. Stable cardiomediastinal silhouette with aortic atherosclerosis. No pneumothorax. Hardware in the cervical spine IMPRESSION: Interval development of right infrahilar airspace disease, suspicious for pneumonia. Electronically Signed   By: Donavan Foil M.D.   On: 07/09/2021 23:52     LOS: 3 days   Antonieta Pert, MD [facility]  07/11/2021, 11:38 AM

## 2021-07-12 ENCOUNTER — Encounter (HOSPITAL_COMMUNITY): Payer: Self-pay | Admitting: Internal Medicine

## 2021-07-12 DIAGNOSIS — N179 Acute kidney failure, unspecified: Secondary | ICD-10-CM | POA: Diagnosis not present

## 2021-07-12 LAB — GLUCOSE, CAPILLARY
Glucose-Capillary: 87 mg/dL (ref 70–99)
Glucose-Capillary: 91 mg/dL (ref 70–99)
Glucose-Capillary: 110 mg/dL — ABNORMAL HIGH (ref 70–99)
Glucose-Capillary: 104 mg/dL — ABNORMAL HIGH (ref 70–99)

## 2021-07-12 LAB — CBC
HCT: 24.9 % — ABNORMAL LOW (ref 36.0–46.0)
Hemoglobin: 7.9 g/dL — ABNORMAL LOW (ref 12.0–15.0)
MCH: 28.8 pg (ref 26.0–34.0)
MCHC: 31.7 g/dL (ref 30.0–36.0)
MCV: 90.9 fL (ref 80.0–100.0)
Platelets: 112 10*3/uL — ABNORMAL LOW (ref 150–400)
RBC: 2.74 MIL/uL — ABNORMAL LOW (ref 3.87–5.11)
RDW: 13.8 % (ref 11.5–15.5)
WBC: 4.1 10*3/uL (ref 4.0–10.5)
nRBC: 0 % (ref 0.0–0.2)

## 2021-07-12 LAB — BASIC METABOLIC PANEL
Anion gap: 5 (ref 5–15)
BUN: 28 mg/dL — ABNORMAL HIGH (ref 8–23)
CO2: 19 mmol/L — ABNORMAL LOW (ref 22–32)
Calcium: 9.2 mg/dL (ref 8.9–10.3)
Chloride: 116 mmol/L — ABNORMAL HIGH (ref 98–111)
Creatinine, Ser: 1.61 mg/dL — ABNORMAL HIGH (ref 0.44–1.00)
GFR, Estimated: 34 mL/min — ABNORMAL LOW (ref >60)
Glucose, Bld: 97 mg/dL (ref 70–99)
Potassium: 3.9 mmol/L (ref 3.5–5.1)
Sodium: 140 mmol/L (ref 135–145)

## 2021-07-12 LAB — C-REACTIVE PROTEIN: CRP: 5.6 mg/dL — ABNORMAL HIGH (ref <1.0)

## 2021-07-12 NOTE — Progress Notes (Signed)
Physical Therapy Treatment Patient Details Name: Joanna [name] MRN: [id number] DOB: 1951/04/09 Today's Date: 07/12/2021   History of Present Illness 70 y.o. female presents to [facility] on 07/07/2021 with increasing confusion and fall. Pt found to be COVID-19+ in ED. Pt admitted for AKI and acute metabolic encephalopathy. PMH includes IIDM, CKD stage II, HTN, hypothyroidism, idiopathic left upper extremity spasm hyperplasia,, chronic urinary incontinence, recent UTI.    PT Comments    Pt more alert this session, oriented to self/place and follows one step commands inconsistently. Received with bowel incontinence and required totalA for peri care performed in supine and then partial standing position. Pt requiring maxA for low pivot transfer from bed to chair. Continue to recommend SNF for ongoing Physical Therapy.      Recommendations for follow up therapy are one component of a multi-disciplinary discharge planning process, led by the attending physician.  Recommendations may be updated based on patient status, additional functional criteria and insurance authorization.  Follow Up Recommendations  SNF     Equipment Recommendations  Wheelchair (measurements PT);Wheelchair cushion (measurements PT);Hospital bed    Recommendations for Other Services       Precautions / Restrictions Precautions Precautions: Fall Precaution Comments: LUE contracture Restrictions Weight Bearing Restrictions: No     Mobility  Bed Mobility Overal bed mobility: Needs Assistance Bed Mobility: Supine to Sit     Supine to sit: Mod assist     General bed mobility comments: Pt received lying laterally in the bed after an episode of bowel incontinence. She had pressed her call bell to alert staff. Pt requiring modA to pull trunk to upright position'    Transfers Overall transfer level: Needs assistance Equipment used: None;Rolling walker (2 wheeled) Transfers: Public house manager;Sit to/from  Stand Sit to Stand: Mod assist   Squat pivot transfers: Max assist     General transfer comment: Pt performing partial sit to stand with modA, holding onto walker handle for support with right hand while PT performed peri care. Requiring maxA for low pivot transfer from bed to chair towards left  Ambulation/Gait                 Stairs             Wheelchair Mobility    Modified Rankin (Stroke Patients Only)       Balance Overall balance assessment: Needs assistance Sitting-balance support: No upper extremity supported;Feet supported Sitting balance-Leahy Scale: Poor Sitting balance - Comments: Pt requiring min guard assist, became progressively worse with increased time EOB Postural control: Right lateral lean Standing balance support: Single extremity supported;During functional activity Standing balance-Leahy Scale: Poor                              Cognition Arousal/Alertness: Awake/alert Behavior During Therapy: Flat affect Overall Cognitive Status: No family/caregiver present to determine baseline cognitive functioning Area of Impairment: Orientation;Following commands;Safety/judgement;Awareness;Problem solving                 Orientation Level: Disoriented to;Time;Situation     Following Commands: Follows one step commands inconsistently Safety/Judgement: Decreased awareness of deficits;Decreased awareness of safety Awareness: Intellectual Problem Solving: Decreased initiation General Comments: Pt alert, follows 1 step commands inconsistently, oriented to self and place and month, but not year. Responses become less as session continues and decreased initiation to assist with mobility      Exercises      General Comments  Pertinent Vitals/Pain Pain Assessment: Faces Faces Pain Scale: No hurt    Home Living                      Prior Function            PT Goals (current goals can now be found in the  care plan section) Acute Rehab PT Goals Patient Stated Goal: did not state Potential to Achieve Goals: Fair Progress towards PT goals: Progressing toward goals    Frequency    Min 2X/week      PT Plan Current plan remains appropriate    Co-evaluation              AM-PAC PT "6 Clicks" Mobility   Outcome Measure  Help needed turning from your back to your side while in a flat bed without using bedrails?: A Lot Help needed moving from lying on your back to sitting on the side of a flat bed without using bedrails?: A Lot Help needed moving to and from a bed to a chair (including a wheelchair)?: A Lot Help needed standing up from a chair using your arms (e.g., wheelchair or bedside chair)?: A Lot Help needed to walk in hospital room?: Total Help needed climbing 3-5 steps with a railing? : Total 6 Click Score: 10    End of Session Equipment Utilized During Treatment: Gait belt Activity Tolerance: Patient tolerated treatment well Patient left: in chair;with call bell/phone within reach;with chair alarm set Nurse Communication: Mobility status PT Visit Diagnosis: Other abnormalities of gait and mobility (R26.89);Muscle weakness (generalized) (M62.81);History of falling (Z91.81);Other symptoms and signs involving the nervous system (R29.898)     Time: 4827-0786 PT Time Calculation (min) (ACUTE ONLY): 38 min  Charges:  $Therapeutic Activity: 38-52 mins                     Wyona Almas, PT, DPT Acute Rehabilitation Services Pager [phone] Office [phone]    Deno Etienne 07/12/2021, 3:13 PM

## 2021-07-12 NOTE — Progress Notes (Addendum)
SPEECH PATHOLOGY - BEDSIDE SWALLOWING EVALUATION  LATE ENTRY from 07/11/21  07/11/21 1200  SLP Visit Information  SLP Received On 07/11/21  Subjective  Subjective alert  General Information  Date of Onset 07/07/21  HPI 70 y.o. female who presented from nursing home to ED with increasing confusion and fall. Pt found to be COVID-19+. Pt admitted for AKI and acute metabolic encephalopathy. Family/niece reported pt to have decreased oral intake for last few days prior to admission. CT head (9/22) revealed no acute intracranial pathology. Chest x ray (9/22) also negative for acute abnormalities. Swallow eval completed 9/23; f/u on 9/24 with findings of functional swallowing; SLP signed off. Swallow eval reordered 9/25 due to concerns for aspiration.  PMH: IIDM, CKD stage II, HTN, hypothyroidism, idiopathic left upper extremity spasm hyperplasia, chronic urinary incontinence, recent UTI.  Type of Study Bedside Swallow Evaluation  Previous Swallow Assessment see HPI  Diet Prior to this Study Regular;Thin liquids  Temperature Spikes Noted No  Respiratory Status Room air  History of Recent Intubation No  Behavior/Cognition Alert;Cooperative  Oral Cavity Assessment WFL  Oral Care Completed by SLP No  Vision Functional for self-feeding  Self-Feeding Abilities Needs assist  Patient Positioning Upright in bed  Baseline Vocal Quality Normal  Volitional Cough Strong  Volitional Swallow Able to elicit  Oral Motor/Sensory Function  Overall Oral Motor/Sensory Function WFL  Ice Chips  Ice chips WFL  Thin Liquid  Thin Liquid WFL  Nectar Thick Liquid  Nectar Thick Liquid NT  Honey Thick Liquid  Honey Thick Liquid NT  Puree  Puree WFL  Solid  Solid WFL  SLP - End of Session  Patient left in bed;with call bell/phone within reach  Nurse Communication Diet recommendation  SLP Assessment  Clinical Impression Statement (ACUTE ONLY) Pt participated in repeat swallow assessment.  She was alert and  amenable to eating lunch tray. She consumed sips of water from a straw, ate 100% of mashed potatoes, portion of broccoli and meat with slowed mastication but no s/s of aspiration. She was communicative and interactive. She requires assistance with feeding.  Recommend continuing current, regular diet; thin liquids.  No concerns for a dysphagia-related aspiration. Our service will sign off.  SLP Visit Diagnosis Dysphagia, unspecified (R13.10)  Swallow Evaluation Recommendations  SLP Diet Recommendations Thin;Age appropriate regular  Liquid Administration via Cup;Straw  Medication Administration Whole meds with puree  Supervision Full assist for feeding  Postural Changes Seated upright at 90 degrees  Treatment Plan  Oral Care Recommendations Oral care BID  Treatment Recommendations No treatment recommended at this time  Follow up Recommendations None  Individuals Consulted  Consulted and Agree with Results and Recommendations RN  SLP Evaluations  $ SLP Speech Visit 1 Visit  SLP Evaluations  $BSS Swallow 1 Procedure

## 2021-07-12 NOTE — Progress Notes (Signed)
PROGRESS NOTE    Joanna [name]  LNL:[id number] DOB: February 13, 1951 DOA: 07/07/2021 PCP: [name], MD   Chief Complaint  Patient presents with   Altered Mental Status    Brief Narrative/Hospital Course: 70 year old female with history of diabetes, CKD 2, hypertension, hypothyroidism, idiopathic left upper extremity spasm hyperplasia, chronic urinary incontinence recent UTI sent from the skilled nursing facility with confusion. Per report patient had decreased oral intake for last 2 days and patient also had a fall tonight before admission or she felt lightheaded before the episode does not remember whether she had lost consciousness or not. Family/niece at the bedside in the ED providing with history. Patient was found confused 9/22 morning by nursing home staff and sent to the ED for evaluation In the ED blood pressure was significantly elevated, patient somewhat confused, not agitated not hypoxic blood work showed AKI 2.2 from baseline 1.1, COVID-19 was positive CT head no acute finding chest x-ray unremarkable Ultrasound kidney small hyperechoic kidneys.  Patient was placed on IV fluids, and admitted 9/24 Temp spike 103 overnight-and chest x-ray concerning for pneumonia-placed on Zosyn and Zithromax and speech consulted   Subjective: Seen this morning mentation much improved but is still very slow to respond.    Assessment & Plan:  Acute metabolic encephalopathy:multifactorial due to dehydration, COVID-19 infection.  CT head, MRI brain no acute finding -generalized age-related cerebral atrophy with moderately advanced chronic microvascular ischemic disease.No evidence of UTI in the ED. overall mentation improving plan is to discharge to skilled nursing facility likely tomorrow with PT OT   Fall/near syncope lightheadedness at the facility: Likely due to dehydration and AKI.  Continue with IV normal hydration, encourage oral intake. Recent echo 2 months ago-in July EF 60-65% no R  WMA  AKI on CKD 2 Baseline creatinine 1.1 on admission 2.2 , suspecting prerenal -continue to hydrate IV fluids another 24 hours if remains stable can discharge skilled nursing facility Recent Labs  Lab 07/08/21 0125 07/09/21 0402 07/10/21 0303 07/11/21 0856 07/12/21 0639  BUN 31* 26* 27* 31* 28*  CREATININE 1.90* 1.64* 1.64* 1.76* 1.61*    Hypercalcemia: likely from volume contraction AKI dehydration.  Calcium improving 11.1>10.6> 10.3? 9.7 with ivf.   Hypoglycemia resolved.  contIVF with dextrose containing ivf, encourage oral intake. Check am cortisol Recent Labs  Lab 07/10/21 2224 07/11/21 1845 07/11/21 2043 07/12/21 0251 07/12/21 0620  GLUCAP 108* 130* 133* 87 91     COVID-19 positive:  Patient was symptomatic with generalized weakness-complete course of remdesivir, no need for steroids as not hypoxic.  CRP elevated d dimer marginal. Recent Labs    07/10/21 0303 07/12/21 0639  CRP 9.7* 5.6*   Right infrahilar pneumonia Fever  9/24 night:  Chest x-ray shows right infrahilar airspace disease suspicious for pneumonia, blood cultures pending, procalcitonin elevated 1.8-started on empiric antibiotic coverage 9/25.  SLP evaluation to ruled out aspiration.  Transition to oral antibiotics upon discharge Recent Labs  Lab 07/08/21 0125 07/09/21 1132 07/10/21 0303 07/11/21 0856 07/12/21 0639  WBC 6.0 3.9* 9.8 3.6* 4.1  LATICACIDVEN  --   --  1.0  --   --   PROCALCITON  --   --  1.80  --   --     IDA: continue iron supplement.  Hemoglobin is stable 7 to 9 g.  Transfusion threshold less than 7 g.  Recent Labs  Lab 07/08/21 0125 07/09/21 1132 07/10/21 0303 07/11/21 0856 07/12/21 0639  HGB 10.4* 9.6* 9.9* 7.7* 7.9*  HCT  34.1* 31.1* 30.8* 24.9* 24.9*     Hypertension: BP well controlled. continue to hold HCTZ and ARB due to AKI. On prn meds  Hypothyroidism: Euthyroid TSH 2.2, continue home Synthroid  Chronic left upper extremity spastic  weakness Debility/deconditioning; Patient is unable to live independently Continue PTOT and a skilled nursing facility placement  NOB:[id number] full code.  Continue on current full scope of treatment.  Discussed with patient's brother who states he is a POA, he states she has has son and daughter.  As per him At baseline she was living in the apartment ambulatory.   Diet Order             Diet regular Room service appropriate? Yes; Fluid consistency: Thin  Diet effective now                 DVT prophylaxis: heparin injection 5,000 Units Start: 07/07/21 2200 Code Status:   Code Status: Full Code  Family Communication: plan of care discussed with patient/s brother on the phone Prior to being admitted to skilled nursing facility she was living in apartment. Updated patient's daughter at the bedside previously  Status is: Inpatient Patient remains hospitalized and will need ongoing IV fluid hydration for management of her hypercalcemia, AKI, encephalopathy.  Dispo: The patient is from: SNF              Anticipated d/c is to: SNF likely tomorrow              Patient currently is not medically stable to d/c.   Difficult to place patient No  Objective: Vitals: Today's Vitals   07/11/21 2040 07/11/21 2325 07/12/21 0128 07/12/21 0619  BP: 120/73   (!) 126/95  Pulse: (!) 52   83  Resp: 16   16  Temp: (!) 97.3 F (36.3 C)     TempSrc: Oral     SpO2: 98%   98%  Weight:      Height:   5\' 2"  (1.575 m)   PainSc:  Asleep     Examination: General exam: AAOx3, ill looking, older than stated age, weak appearing. HEENT:Oral mucosa moist, Ear/Nose WNL grossly, dentition normal. Respiratory system: bilaterally diminished, , no use of accessory muscle Cardiovascular system: S1 & S2 +, No JVD,. Gastrointestinal system: Abdomen soft, NT,ND, BS+ Nervous System:Alert, awake, weakness on the left side with spasticity of left upper extremity  Extremities: no edema, distal peripheral pulses  palpable.  Skin: No rashes,no icterus. MSK: Normal muscle bulk,tone, power    Intake/Output Summary (Last 24 hours) at 07/12/2021 1134 Last data filed at 07/11/2021 2241 Gross per 24 hour  Intake 1440.29 ml  Output 600 ml  Net 840.29 ml    Filed Weights   07/09/21 0500 07/09/21 2358 07/11/21 0351  Weight: 51.1 kg 51.4 kg 52.7 kg   Weight change:    Consultants:see note  Procedures:see note Antimicrobials: Anti-infectives (From admission, onward)    Start     Dose/Rate Route Frequency Ordered Stop   07/10/21 1045  piperacillin-tazobactam (ZOSYN) IVPB 3.375 g        3.375 g 12.5 mL/hr over 240 Minutes Intravenous Every 8 hours 07/10/21 0954     07/10/21 1000  azithromycin (ZITHROMAX) tablet 500 mg        500 mg Oral Daily 07/10/21 0842 07/12/21 1114   07/09/21 1000  remdesivir 100 mg in sodium chloride 0.9 % 100 mL IVPB       See Hyperspace for full Linked Orders Report.  100 mg 200 mL/hr over 30 Minutes Intravenous Daily 07/08/21 1040 07/10/21 1014   07/08/21 1130  remdesivir 200 mg in sodium chloride 0.9% 250 mL IVPB       See Hyperspace for full Linked Orders Report.   200 mg 580 mL/hr over 30 Minutes Intravenous Once 07/08/21 1040 07/08/21 1415      Culture/Microbiology    Component Value Date/Time   SDES BLOOD SITE NOT SPECIFIED 07/10/2021 0303   SDES BLOOD SITE NOT SPECIFIED 07/10/2021 0303   SPECREQUEST  07/10/2021 0303    AEROBIC BOTTLE ONLY Blood Culture results may not be optimal due to an inadequate volume of blood received in culture bottles   SPECREQUEST  07/10/2021 0303    AEROBIC BOTTLE ONLY Blood Culture results may not be optimal due to an inadequate volume of blood received in culture bottles   CULT  07/10/2021 0303    NO GROWTH 1 DAY Performed at [facility], [place] [address] [place], [place] [address]    CULT  07/10/2021 0303    NO GROWTH 1 DAY Performed at [facility] [address] [place], [place] [address]    REPTSTATUS  PENDING 07/10/2021 0303   REPTSTATUS PENDING 07/10/2021 0303    Other culture-see note  Unresulted Labs (From admission, onward)     Start     Ordered   07/12/21 2458  Basic metabolic panel  Daily,   R     Question:  Specimen collection method  Answer:  Lab=Lab collect   07/11/21 0759   07/12/21 0500  CBC  Daily,   R     Question:  Specimen collection method  Answer:  Lab=Lab collect   07/11/21 0759   07/09/21 2330  Urinalysis, Complete w Microscopic Urine, Clean Catch  Once,   R        07/09/21 2329   07/09/21 0500  C-reactive protein  Daily,   R      07/08/21 1040          medications reviewed:  Scheduled Meds:  vitamin C  500 mg Oral Daily   famotidine  20 mg Oral Daily   feeding supplement  237 mL Oral BID BM   ferrous sulfate  325 mg Oral Q supper   heparin  5,000 Units Subcutaneous Q12H   levothyroxine  88 mcg Oral Q0600   loratadine  10 mg Oral Daily   multivitamin with minerals  1 tablet Oral Daily   rosuvastatin  20 mg Oral Daily   thiamine  100 mg Oral Daily   topiramate  50 mg Oral BID   traZODone  25-50 mg Oral QHS   zinc sulfate  220 mg Oral Daily   Continuous Infusions:  dextrose 5 % and 0.9% NaCl 75 mL/hr at 07/11/21 1736   piperacillin-tazobactam (ZOSYN)  IV 3.375 g (07/12/21 1123)     Intake/Output from previous day: 09/26 0701 - 09/27 0700 In: 2053.3 [P.O.:447; I.V.:1245.4; IV Piggyback:123.9] Out: 600 [Urine:600] Intake/Output this shift: No intake/output data recorded. Filed Weights   07/09/21 0500 07/09/21 2358 07/11/21 0351  Weight: 51.1 kg 51.4 kg 52.7 kg   Data Reviewed: I have personally reviewed following labs and imaging studies CBC: Recent Labs  Lab 07/08/21 0125 07/09/21 1132 07/10/21 0303 07/11/21 0856 07/12/21 0639  WBC 6.0 3.9* 9.8 3.6* 4.1  HGB 10.4* 9.6* 9.9* 7.7* 7.9*  HCT 34.1* 31.1* 30.8* 24.9* 24.9*  MCV 95.3 93.1 91.7 93.3 90.9  PLT 164 178 164 151  112*    Basic Metabolic Panel: Recent Labs  Lab  07/07/21 1512 07/07/21 1618 07/08/21 0125 07/09/21 0402 07/10/21 0303 07/11/21 0856 07/12/21 0639  NA 137   < > 137 138 135 140 140  K 4.3   < > 4.3 4.1 3.6 3.7 3.9  CL 103  --  105 107 107 114* 116*  CO2 22  --  22 20* 18* 19* 19*  GLUCOSE 105*  --  89 66* 130* 100* 97  BUN 38*  --  31* 26* 27* 31* 28*  CREATININE 2.24*  --  1.90* 1.64* 1.64* 1.76* 1.61*  CALCIUM 11.1*  --  10.6* 10.3 9.7 9.2 9.2  MG 2.5*  --   --  1.9  --   --   --    < > = values in this interval not displayed.    GFR: Estimated Creatinine Clearance: 26.1 mL/min (A) (by C-G formula based on SCr of 1.61 mg/dL (H)). Liver Function Tests: Recent Labs  Lab 07/07/21 1512 07/09/21 0402 07/10/21 0303  AST 16 27 26   ALT 17 17 16   ALKPHOS 79 76 62  BILITOT 0.5 0.6 0.5  PROT 6.6 6.5 5.5*  ALBUMIN 3.4* 3.2* 2.8*    No results for input(s): LIPASE, AMYLASE in the last 168 hours. Recent Labs  Lab 07/09/21 0756  AMMONIA 15    Coagulation Profile: No results for input(s): INR, PROTIME in the last 168 hours. Cardiac Enzymes: No results for input(s): CKTOTAL, CKMB, CKMBINDEX, TROPONINI in the last 168 hours. BNP (last 3 results) No results for input(s): PROBNP in the last 8760 hours. HbA1C: No results for input(s): HGBA1C in the last 72 hours. CBG: Recent Labs  Lab 07/10/21 2224 07/11/21 1845 07/11/21 2043 07/12/21 0251 07/12/21 0620  GLUCAP 108* 130* 133* 87 91    Lipid Profile: No results for input(s): CHOL, HDL, LDLCALC, TRIG, CHOLHDL, LDLDIRECT in the last 72 hours. Thyroid Function Tests: No results for input(s): TSH, T4TOTAL, FREET4, T3FREE, THYROIDAB in the last 72 hours.  Anemia Panel: No results for input(s): VITAMINB12, FOLATE, FERRITIN, TIBC, IRON, RETICCTPCT in the last 72 hours.  Sepsis Labs: Recent Labs  Lab 07/10/21 0303  PROCALCITON 1.80  LATICACIDVEN 1.0     Recent Results (from the past 240 hour(s))  Resp Panel by RT-PCR (Flu A&B, Covid) Nasopharyngeal Swab      Status: Abnormal   Collection Time: 07/07/21  5:23 PM   Specimen: Nasopharyngeal Swab; Nasopharyngeal(NP) swabs in vial transport medium  Result Value Ref Range Status   SARS Coronavirus 2 by RT PCR POSITIVE (A) NEGATIVE Final    Comment: RESULT CALLED TO, READ BACK BY AND VERIFIED WITH: MELVIN, L.,RN 07/07/2021 2035 BY PAW. (NOTE) SARS-CoV-2 target nucleic acids are DETECTED.  The SARS-CoV-2 RNA is generally detectable in upper respiratory specimens during the acute phase of infection. Positive results are indicative of the presence of the identified virus, but do not rule out bacterial infection or co-infection with other pathogens not detected by the test. Clinical correlation with patient history and other diagnostic information is necessary to determine patient infection status. The expected result is Negative.  Fact Sheet for Patients: EntrepreneurPulse.com.au  Fact Sheet for Healthcare Providers: IncredibleEmployment.be  This test is not yet approved or cleared by the [place] FDA and  has been authorized for detection and/or diagnosis of SARS-CoV-2 by FDA under an Emergency Use Authorization (EUA).  This EUA will remain in effect (meaning this test ca n be used) for the duration  of  the COVID-19 declaration under Section 564(b)(1) of the Act, 21 U.S.C. section 360bbb-3(b)(1), unless the authorization is terminated or revoked sooner.     Influenza A by PCR NEGATIVE NEGATIVE Final   Influenza B by PCR NEGATIVE NEGATIVE Final    Comment: (NOTE) The Xpert Xpress SARS-CoV-2/FLU/RSV plus assay is intended as an aid in the diagnosis of influenza from Nasopharyngeal swab specimens and should not be used as a sole basis for treatment. Nasal washings and aspirates are unacceptable for Xpert Xpress SARS-CoV-2/FLU/RSV testing.  Fact Sheet for Patients: EntrepreneurPulse.com.au  Fact Sheet for Healthcare  Providers: IncredibleEmployment.be  This test is not yet approved or cleared by the [place] FDA and has been authorized for detection and/or diagnosis of SARS-CoV-2 by FDA under an Emergency Use Authorization (EUA). This EUA will remain in effect (meaning this test can be used) for the duration of the COVID-19 declaration under Section 564(b)(1) of the Act, 21 U.S.C. section 360bbb-3(b)(1), unless the authorization is terminated or revoked.  Performed at [facility], [place] [address], [place], [place] [address]   MRSA Next Gen by PCR, Nasal     Status: None   Collection Time: 07/09/21  1:39 AM   Specimen: Nasal Mucosa; Nasal Swab  Result Value Ref Range Status   MRSA by PCR Next Gen NOT DETECTED NOT DETECTED Final    Comment: (NOTE) The GeneXpert MRSA Assay (FDA approved for NASAL specimens only), is one component of a comprehensive MRSA colonization surveillance program. It is not intended to diagnose MRSA infection nor to guide or monitor treatment for MRSA infections. Test performance is not FDA approved in patients less than 101 years old. Performed at [facility], [place] [address], [place], [place] [address]   Culture, blood (routine x 2)     Status: None (Preliminary result)   Collection Time: 07/10/21  3:03 AM   Specimen: BLOOD  Result Value Ref Range Status   Specimen Description BLOOD SITE NOT SPECIFIED  Final   Special Requests   Final    AEROBIC BOTTLE ONLY Blood Culture results may not be optimal due to an inadequate volume of blood received in culture bottles   Culture   Final    NO GROWTH 1 DAY Performed at [facility], [place] [address], [place], [place] [address]    Report Status PENDING  Incomplete  Culture, blood (routine x 2)     Status: None (Preliminary result)   Collection Time: 07/10/21  3:03 AM   Specimen: BLOOD  Result Value Ref Range Status   Specimen Description BLOOD SITE NOT SPECIFIED  Final   Special  Requests   Final    AEROBIC BOTTLE ONLY Blood Culture results may not be optimal due to an inadequate volume of blood received in culture bottles   Culture   Final    NO GROWTH 1 DAY Performed at [facility], [place] [address], [place], [place] [address]    Report Status PENDING  Incomplete      Radiology Studies: No results found.   LOS: 4 days   Antonieta Pert, MD [facility]  07/12/2021, 11:34 AM

## 2021-07-12 NOTE — TOC Progression Note (Signed)
Transition of Care [facility]) - Progression Note    Patient Details  Name: Joanna [name] MRN: [id number] Date of Birth: 08/26/1951  Transition of Care [facility]) CM/SW Contact  Sharlet Salina Mila Homer, LCSW Phone Number: 07/12/2021, 1:22 PM  Clinical Narrative:  CSW talked with patient's daughter [name] regarding patient, and [place] paperwork. Daughter advised that someone from the [facility] would be coming to the room at some point today. Ms. Redmond Pulling also indicated that she has bank paperwork that must be notarized and was informed that she can mention this to the [facility] when he comes to the room to see if they can assist her.  Ms. Redmond Pulling indicated that she will be going back home Thursday, and her younger sister Phineas Real, who lives in [place] will be visiting their mom until discharge.  When asked, Fonda Kinder confirmed that her mother will be returning to [place] once discharge.  Expected Discharge Plan: [place] Barriers to Discharge: Continued Medical Work up  Expected Discharge Plan and Services Expected Discharge Plan: [place] In-house Referral: Clinical Social Work   Post Acute Care Choice: [place] Living arrangements for the past 2 months: Apartment                           HH Arranged: NA [place] Agency: NA         Social Determinants of Health (SDOH) Interventions  No SDOH interventions requested or needed at this time.  Readmission Risk Interventions Readmission Risk Prevention Plan 06/27/2021  Transportation Screening Complete  HRI or [place] Complete  Social Work Consult for [place] Planning/Counseling Complete  Palliative Care Screening Not Applicable  Medication Review Press photographer) Complete  Some recent data might be hidden

## 2021-07-13 DIAGNOSIS — N179 Acute kidney failure, unspecified: Secondary | ICD-10-CM | POA: Diagnosis not present

## 2021-07-13 DIAGNOSIS — E869 Volume depletion, unspecified: Secondary | ICD-10-CM | POA: Diagnosis not present

## 2021-07-13 DIAGNOSIS — G9341 Metabolic encephalopathy: Secondary | ICD-10-CM | POA: Diagnosis not present

## 2021-07-13 LAB — CBC
HCT: 25.3 % — ABNORMAL LOW (ref 36.0–46.0)
Hemoglobin: 7.9 g/dL — ABNORMAL LOW (ref 12.0–15.0)
MCH: 28.8 pg (ref 26.0–34.0)
MCHC: 31.2 g/dL (ref 30.0–36.0)
MCV: 92.3 fL (ref 80.0–100.0)
Platelets: 167 10*3/uL (ref 150–400)
RBC: 2.74 MIL/uL — ABNORMAL LOW (ref 3.87–5.11)
RDW: 13.9 % (ref 11.5–15.5)
WBC: 4.4 10*3/uL (ref 4.0–10.5)
nRBC: 0 % (ref 0.0–0.2)

## 2021-07-13 LAB — GLUCOSE, CAPILLARY
Glucose-Capillary: 149 mg/dL — ABNORMAL HIGH (ref 70–99)
Glucose-Capillary: 81 mg/dL (ref 70–99)
Glucose-Capillary: 80 mg/dL (ref 70–99)
Glucose-Capillary: 82 mg/dL (ref 70–99)

## 2021-07-13 LAB — BASIC METABOLIC PANEL
Anion gap: 5 (ref 5–15)
BUN: 17 mg/dL (ref 8–23)
CO2: 18 mmol/L — ABNORMAL LOW (ref 22–32)
Calcium: 9.1 mg/dL (ref 8.9–10.3)
Chloride: 117 mmol/L — ABNORMAL HIGH (ref 98–111)
Creatinine, Ser: 1.33 mg/dL — ABNORMAL HIGH (ref 0.44–1.00)
GFR, Estimated: 43 mL/min — ABNORMAL LOW (ref >60)
Glucose, Bld: 116 mg/dL — ABNORMAL HIGH (ref 70–99)
Potassium: 3.6 mmol/L (ref 3.5–5.1)
Sodium: 140 mmol/L (ref 135–145)

## 2021-07-13 LAB — C-REACTIVE PROTEIN: CRP: 3.6 mg/dL — ABNORMAL HIGH (ref <1.0)

## 2021-07-13 NOTE — Progress Notes (Signed)
   07/13/21 1237  Clinical Encounter Type  Visited With Patient and family together  Visit Type Follow-up  Referral From Nurse;Family    [facility] received page to assist family w/AD completion/notarization; per chart review, Chaplain Dorian Pod was consulted for AD education on Monday and explained to staff/pt. that notarization would not be possible due to pt.'s COVID isolation precautions.  [place] spoke w/pt.'s dtr. who lives in [place], sharing the same information and that if pt. comes off COVID precautions chaplains would be happy to coordinate AD notarization.  Family grateful for visit.  No further needs at this time.  Lindaann Pascal, Chaplain Pager: [phone]

## 2021-07-13 NOTE — Progress Notes (Signed)
Occupational Therapy Treatment Patient Details Name: Joanna [name] MRN: [id number] DOB: [birth year] Today's Date: 07/13/2021   History of present illness 70 y.o. female presents to [facility] on [facility]07/2021 with increasing confusion and fall. Pt found to be COVID-19+ in ED. Pt admitted for AKI and acute metabolic encephalopathy. PMH includes IIDM, CKD stage II, HTN, hypothyroidism, idiopathic left upper extremity spasm hyperplasia,, chronic urinary incontinence, recent UTI.   OT comments  Patient received in bed and was willing to get to eob for grooming. Patient required mod assist to get to eob and min guard to min assist for sitting balance. Patient required assistance to load toothbrush and vcs for sequencing. Patient performed light grooming seated on eob with vcs for sequencing. Patient was SPT to recliner to sit up for lunch. Therapist provided setup for lunch and patient required min assist to load utensils. Patient's family arrived and assisted patient with completing lunch.  Acute OT to continue to follow.    Recommendations for follow up therapy are one component of a multi-disciplinary discharge planning process, led by the attending physician.  Recommendations may be updated based on patient status, additional functional criteria and insurance authorization.    Follow Up Recommendations  SNF    Equipment Recommendations  Wheelchair (measurements OT);Wheelchair cushion (measurements OT)    Recommendations for Other Services      Precautions / Restrictions Precautions Precautions: Fall Precaution Comments: LUE contracture       Mobility Bed Mobility Overal bed mobility: Needs Assistance Bed Mobility: Supine to Sit     Supine to sit: Mod assist     General bed mobility comments: reqpired assistance with BLEs and trunk    Transfers Overall transfer level: Needs assistance Equipment used: None Transfers: Squat Pivot Transfers;Sit to/from Stand Sit to Stand: Mod  assist Stand pivot transfers: Min guard       General transfer comment: performed stand pivot transfer without RW to allow for trunk support    Balance Overall balance assessment: Needs assistance Sitting-balance support: No upper extremity supported;Feet supported Sitting balance-Leahy Scale: Poor Sitting balance - Comments: provided min guard and occasional min assist for sitting balance                                   ADL either performed or assessed with clinical judgement   ADL Overall ADL's : Needs assistance/impaired Eating/Feeding: Minimal assistance;Sitting Eating/Feeding Details (indicate cue type and reason): performed seated in recliner, family present to complete meal Grooming: Wash/dry face;Oral care;Sitting;Minimal assistance Grooming Details (indicate cue type and reason): performed while sitting on eob with vcs for sequencing and min to min guard for sitting balance                               General ADL Comments: patient able to participate with grooming and eating,     Vision       Perception     Praxis      Cognition Arousal/Alertness: Awake/alert Behavior During Therapy: Flat affect Overall Cognitive Status: No family/caregiver present to determine baseline cognitive functioning Area of Impairment: Orientation;Following commands;Safety/judgement;Awareness;Problem solving                 Orientation Level: Disoriented to;Time;Situation     Following Commands: Follows one step commands inconsistently Safety/Judgement: Decreased awareness of deficits;Decreased awareness of safety Awareness: Intellectual Problem Solving: Decreased initiation  General Comments: slow process and inconsistant on following 1 step commands        Exercises     Shoulder Instructions       General Comments      Pertinent Vitals/ Pain       Pain Assessment: Faces Faces Pain Scale: No hurt  Home Living                                           Prior Functioning/Environment              Frequency  Min 2X/week        Progress Toward Goals  OT Goals(current goals can now be found in the care plan section)  Progress towards OT goals: Progressing toward goals  Acute Rehab OT Goals Patient Stated Goal: did not state OT Goal Formulation: Patient unable to participate in goal setting Time For Goal Achievement: 07/23/21 Potential to Achieve Goals: Good ADL Goals Pt Will Perform Eating: with set-up;sitting Pt Will Perform Grooming: with set-up;sitting Pt Will Transfer to Toilet: with modified independence;ambulating;grab bars Pt Will Perform Toileting - Clothing Manipulation and hygiene: with modified independence;sit to/from stand;sitting/lateral leans Pt/caregiver will Perform Home Exercise Program: Increased strength;Right Upper extremity;Independently;With written HEP provided Additional ADL Goal #1: Patient will supine to sit with Min a of one to increase independence with toileting.  Plan Discharge plan remains appropriate    Co-evaluation                 AM-PAC OT "6 Clicks" Daily Activity     Outcome Measure   Help from another person eating meals?: A Lot Help from another person taking care of personal grooming?: A Lot Help from another person toileting, which includes using toliet, bedpan, or urinal?: Total Help from another person bathing (including washing, rinsing, drying)?: Total Help from another person to put on and taking off regular upper body clothing?: Total Help from another person to put on and taking off regular lower body clothing?: Total 6 Click Score: 8    End of Session Equipment Utilized During Treatment: Gait belt  OT Visit Diagnosis: Muscle weakness (generalized) (M62.81);Unsteadiness on feet (R26.81);Other symptoms and signs involving cognitive function;Feeding difficulties (R63.3)   Activity Tolerance Patient tolerated treatment well    Patient Left in chair;with call bell/phone within reach;with chair alarm set;with family/visitor present   Nurse Communication Other (comment) (on transfer status)        Time: 2831-5176 OT Time Calculation (min): 33 min  Charges: OT General Charges $OT Visit: 1 Visit OT Treatments $Self Care/Home Management : 23-37 mins  Lodema Hong, [place] 07/13/2021, 12:25 PM

## 2021-07-13 NOTE — Care Management Important Message (Signed)
Important Message  Patient Details  Name: SERIAH BROTZMAN MRN: [id number] Date of Birth: 1951-06-08   Medicare Important Message Given:  Yes  Patient has a Contact Precaution order in  place will mail the IM document  to  the patient home address.    Kamoni Gentles 07/13/2021, 2:41 PM

## 2021-07-13 NOTE — Progress Notes (Signed)
Pharmacy Antibiotic Note  Joanna [name] is a 70 y.o. female admitted on 07/07/2021 with decreased oral intake and lightheadedness found to be COVID positive. Patient is on day 2 of remdesivir. The patient had a temperature spike of 103 9/24 PM of unclear source with increased WBC. CXR shows signs of PNA with concerns for aspiration/CAP.  Pharmacy consulted on 9/25 for Zosyn dosing.   Day #4 Zosyn and completed 3 of 3 days of Azithromycin PO for CAP and rule out asp PNA. Marland Kitchen Also COVID +, no hypoxia.  Completed 3 days of Remdesivir 9/25. O2 sats 98% on RA.   Afebrile, WBC  trended down/ wnl  CRP 7.2>>9.7>5.6>3.6  LA 1.0, PCT 1.80 SCr has trended down to 1.33,  CrCl ~ 31.6 ml/min.   Plan: Continue Zosyn 3.375g IV q8h (4 hour infusion).  F/U LOT, Scr, de-escalation, blood cultures   Height: 5\' 2"  (157.5 cm) Weight: 52.9 kg (116 lb 10 oz) IBW/kg (Calculated) : 50.1  Temp (24hrs), Avg:98.3 F (36.8 C), Min:98 F (36.7 C), Max:98.4 F (36.9 C)  Recent Labs  Lab 07/09/21 0402 07/09/21 1132 07/10/21 0303 07/11/21 0856 07/12/21 0639 07/13/21 0509  WBC  --  3.9* 9.8 3.6* 4.1 4.4  CREATININE 1.64*  --  1.64* 1.76* 1.61* 1.33*  LATICACIDVEN  --   --  1.0  --   --   --      Estimated Creatinine Clearance: 31.6 mL/min (A) (by C-G formula based on SCr of 1.33 mg/dL (H)).    Allergies  Allergen Reactions   Zoloft [Sertraline Hcl] Other (See Comments)    Made the patient feel withdrawn and had bad dreams- "Allergic," per MAR   Ivp Dye [Iodinated Diagnostic Agents] Rash and Other (See Comments)    "Allergic," per [facility]    Antimicrobials this admission: Remdesivir 9/23>>9/25 Azithromycin 9/25>9/28 Zosyn 9/25>>  Microbiology results: 9/25 Blood x 2: no growth to date x3 days 9/24 MRSA PCR: NOT detected 9/22 Covid: + ;  Flu: negative   Thank you for allowing pharmacy to be a part of this patient's care.  Nicole Cella, RPh Clinical Pharmacist [phone] 07/13/2021 4:51 PM   Please  check AMION for all [place] phone numbers After 10:00 PM, call [place] [phone]

## 2021-07-13 NOTE — TOC Progression Note (Signed)
Transition of Care [facility]) - Progression Note    Patient Details  Name: Joanna [name] MRN: [id number] Date of Birth: Nov 26, 1950  [facility] of [facility]) CM/SW Contact  Joanna Salina Mila Homer, Joanna [name] Phone Number: 07/13/2021, 5:25 PM  Clinical Narrative:  CSW advised that patient ready for discharge today. Contact made with Joanna [name] and was informed by admissions director Joanna [name] that they would not be able to accept patient until day 11 (10/2)from the date she tested positive at [facility], if they have an available bed. Per Joanna [name], patient was with them from the 13th through the 22nd and did not test positive for COVID.   Secure chat sent to MD and daughter Joanna [name] contacted [phone]) and updated. Initially she requested that her mother return to [place]. Provided MD and Joanna [name] at [facility] with this information. MD asked about patient going to another facility and Joanna [name] indicated that they can accept patient if they have a bed on the 11th, as they don't hold beds (unless patient/family can pay to hold a bed). Talked with daughter again regarding Joanna [name] and no guarantee of bed availability and CSW was unable to talk with her mother (she remains on contact for COVID and did not answer the room phone when call was made to her). Talked with daughter again at 5:34 pm regarding not being able to talk with her mother. She asked about [facility] and was advised that CSW will contact facility on Thursday - will determine if they accept [employer] and bed availability.     Expected Discharge Plan: Skilled Nursing Facility Barriers to Discharge: Continued Medical Work up  Expected Discharge Plan and Services Expected Discharge Plan: [place] In-house Referral: Clinical Social Work   Post Acute Care Choice: [place] Living arrangements for the past 2 months: Apartment                           HH Arranged: NA [place] Agency: NA          Social Determinants of Health (SDOH) Interventions  None needed or requested at this time  Readmission Risk Interventions Readmission Risk Prevention Plan 06/27/2021  Transportation Screening Complete  HRI or [place] Complete  Social Work Consult for [place] Planning/Counseling Complete  Palliative Care Screening Not Applicable  Medication Review Press photographer) Complete  Some recent data might be hidden

## 2021-07-13 NOTE — Progress Notes (Addendum)
PROGRESS NOTE    Joanna [name]  FBP:[id number] DOB: 07/20/1951 DOA: 07/07/2021 PCP: [name], MD   Brief Narrative/Hospital Course: 70 year old female with past medical history of diabetes mellitus, CKD stage II, hypertension, hypothyroidism, idiopathic left upper extremity spasm hyperplasia, chronic urinary incontinence, recent UTI was sent from the skilled nursing facility with complaints of confusion decreased oral intake.  In the ED patient was noted to have elevated blood pressure and had mild acute kidney injury with creatinine of 2.2 from baseline of 1.1.  COVID-19 was positive.  CT head scan was negative for acute finding.  Chest x-ray was unremarkable.  Ultrasound of the kidneys showed hyperechoic kidneys.  Patient was then admitted to hospital with IV fluids.  She did have fever spike during hospitalization concerning for pneumonia so Zosyn and Zithromax was initiated and speech therapy was consulted.  Patient was then admitted hospital for further evaluation and treatment.  Assessment & Plan:  Acute metabolic encephalopathy: Likely multifactorial from volume depletion dehydration COVID-19 infection.  CT head scan MRI was negative for any acute findings but cerebral atrophy.  Has improved at this time.  Fall/near syncope, lightheadedness Likely due to dehydration and acute kidney injury.  COVID IV fluid hydration.  2D echocardiogram 2 months back was within normal limits with LV ejection fraction of 60 to 65%.  Was seen by physical therapy who recommended skilled nursing facility on discharge.  AKI on CKD 2 Baseline creatinine 1.1.  Admission creatinine was 2.2.  Has trended down to 1.3 today.  Improved at this time.  Hypercalcemia: Improved after IV fluids.  DC IV fluids today.  Hypoglycemia resolved at this time.  Will discontinue D5 normal saline for now.  Encourage oral intake.  COVID-19 positive:  Patient had generalized weakness.  CRP mildly elevated.  Not hypoxic so no  need for steroids.  Completed course of remdesivir.  Fever secondary to pneumonia. Blood cultures negative in 3 days.  Chest x-ray shows right infra hilar airspace disease.  Procalcitonin was elevated at 1.8.  On IV antibiotics.  Speech therapy has been consulted.  Transition to oral on discharge  Iron deficiency anemia. Continue iron supplement.  Transfuse for hemoglobin less than 7.  Hypertension: HCTZ and ARB on hold due to AKI.  Continue hydralazine for now.   Hypothyroidism: Continue Synthroid   Chronic left upper extremity spastic weakness/Debility/deconditioning; Physical therapy Occupational Therapy on board and recommended skilled nursing facility placement on discharge.  Patient unable to take care of herself  Addendum:  07/13/2021 1:57 PM    DVT prophylaxis: heparin injection 5,000 Units Start: 07/07/21 2200  Code Status:    Code Status: Full Code   Family Communication:  None today.    Status is: Inpatient  Patient remains inpatient due to need for IV medications, IV antibiotic, rehabilitation placement Dispo: The patient is from: SNF              Anticipated d/c is to: SNF when insurance is authorized.              Patient currently is medically stable to d/c.   Difficult to place patient No  Subjective: Today, patient was seen and examined at bedside.  Patient denies any fever, chills or rigor.  Complains of generalized weakness.  Objective:  Today's Vitals   07/12/21 2123 07/12/21 2310 07/13/21 0526 07/13/21 0637  BP: 128/84  137/82   Pulse: 90  89   Resp: 16  18   Temp: 98.4 F (36.9 C)  98.3 F (36.8 C)   TempSrc:   Oral   SpO2: 100%  100%   Weight: 52.9 kg   52.9 kg  Height:      PainSc:  Asleep     Physical examination: General:  Average built, not in obvious distress appears weak and deconditioned. HENT:   No scleral pallor or icterus noted. Oral mucosa is moist.  Chest:    Diminished breath sounds bilaterally. No crackles or wheezes.   CVS: S1 &S2 heard. No murmur.  Regular rate and rhythm. Abdomen: Soft, nontender, nondistended.  Bowel sounds are heard.   Extremities: No cyanosis, clubbing or edema.  Peripheral pulses are palpable. Psych: Alert, awake and oriented, normal mood CNS:  No cranial nerve deficits.  Weakness of the left side with spasticity of the left upper extremity Skin: Warm and dry.  No rashes noted.  Intake/Output Summary (Last 24 hours) at 07/13/2021 1057 Last data filed at 07/13/2021 0800 Gross per 24 hour  Intake 1884.4 ml  Output 1175 ml  Net 709.4 ml    Filed Weights   07/11/21 0351 07/12/21 2123 07/13/21 0637  Weight: 52.7 kg 52.9 kg 52.9 kg   Weight change:    Consultants: None  Procedures: None  Antimicrobials:  Anti-infectives (From admission, onward)    Start     Dose/Rate Route Frequency Ordered Stop   07/10/21 1045  piperacillin-tazobactam (ZOSYN) IVPB 3.375 g        3.375 g 12.5 mL/hr over 240 Minutes Intravenous Every 8 hours 07/10/21 0954     07/10/21 1000  azithromycin (ZITHROMAX) tablet 500 mg        500 mg Oral Daily 07/10/21 0842 07/12/21 1114   07/09/21 1000  remdesivir 100 mg in sodium chloride 0.9 % 100 mL IVPB       See Hyperspace for full Linked Orders Report.   100 mg 200 mL/hr over 30 Minutes Intravenous Daily 07/08/21 1040 07/10/21 1014   07/08/21 1130  remdesivir 200 mg in sodium chloride 0.9% 250 mL IVPB       See Hyperspace for full Linked Orders Report.   200 mg 580 mL/hr over 30 Minutes Intravenous Once 07/08/21 1040 07/08/21 1415      Culture/Microbiology    Component Value Date/Time   SDES BLOOD SITE NOT SPECIFIED 07/10/2021 0303   SDES BLOOD SITE NOT SPECIFIED 07/10/2021 0303   SPECREQUEST  07/10/2021 0303    AEROBIC BOTTLE ONLY Blood Culture results may not be optimal due to an inadequate volume of blood received in culture bottles   SPECREQUEST  07/10/2021 0303    AEROBIC BOTTLE ONLY Blood Culture results may not be optimal due to an  inadequate volume of blood received in culture bottles   CULT  07/10/2021 0303    NO GROWTH 3 DAYS Performed at [facility], [place] [address] [place], [place] [address]    CULT  07/10/2021 0303    NO GROWTH 3 DAYS Performed at [facility] [address] [place], [place] [address]    REPTSTATUS PENDING 07/10/2021 0303   REPTSTATUS PENDING 07/10/2021 0303    Other culture-see note  Unresulted Labs (From admission, onward)     Start     Ordered   07/12/21 0093  Basic metabolic panel  Daily,   R     Question:  Specimen collection method  Answer:  Lab=Lab collect   07/11/21 0759   07/12/21 0500  CBC  Daily,   R  Question:  Specimen collection method  Answer:  Lab=Lab collect   07/11/21 0759   07/09/21 2330  Urinalysis, Complete w Microscopic Urine, Clean Catch  Once,   R        07/09/21 2329          medications reviewed:  Scheduled Meds:  vitamin C  500 mg Oral Daily   famotidine  20 mg Oral Daily   feeding supplement  237 mL Oral BID BM   ferrous sulfate  325 mg Oral Q supper   heparin  5,000 Units Subcutaneous Q12H   levothyroxine  88 mcg Oral Q0600   loratadine  10 mg Oral Daily   multivitamin with minerals  1 tablet Oral Daily   rosuvastatin  20 mg Oral Daily   thiamine  100 mg Oral Daily   topiramate  50 mg Oral BID   traZODone  25-50 mg Oral QHS   zinc sulfate  220 mg Oral Daily   Continuous Infusions:  dextrose 5 % and 0.9% NaCl 75 mL/hr at 07/12/21 1808   piperacillin-tazobactam (ZOSYN)  IV 3.375 g (07/13/21 0400)     Intake/Output from previous day: 09/27 0701 - 09/28 0700 In: 1824.4 [P.O.:240; I.V.:1534.4; IV Piggyback:50] Out: 1175 [Urine:1175] Intake/Output this shift: Total I/O In: 60 [P.O.:60] Out: -  Filed Weights   07/11/21 0351 07/12/21 2123 07/13/21 0637  Weight: 52.7 kg 52.9 kg 52.9 kg   Data Reviewed: I have personally reviewed the following labs and imaging studies.    CBC: Recent Labs  Lab 07/09/21 1132  07/10/21 0303 07/11/21 0856 07/12/21 0639 07/13/21 0509  WBC 3.9* 9.8 3.6* 4.1 4.4  HGB 9.6* 9.9* 7.7* 7.9* 7.9*  HCT 31.1* 30.8* 24.9* 24.9* 25.3*  MCV 93.1 91.7 93.3 90.9 92.3  PLT 178 164 151 112* 169    Basic Metabolic Panel: Recent Labs  Lab 07/07/21 1512 07/07/21 1618 07/09/21 0402 07/10/21 0303 07/11/21 0856 07/12/21 0639 07/13/21 0509  NA 137   < > 138 135 140 140 140  K 4.3   < > 4.1 3.6 3.7 3.9 3.6  CL 103   < > 107 107 114* 116* 117*  CO2 22   < > 20* 18* 19* 19* 18*  GLUCOSE 105*   < > 66* 130* 100* 97 116*  BUN 38*   < > 26* 27* 31* 28* 17  CREATININE 2.24*   < > 1.64* 1.64* 1.76* 1.61* 1.33*  CALCIUM 11.1*   < > 10.3 9.7 9.2 9.2 9.1  MG 2.5*  --  1.9  --   --   --   --    < > = values in this interval not displayed.    GFR: Estimated Creatinine Clearance: 31.6 mL/min (A) (by C-G formula based on SCr of 1.33 mg/dL (H)). Liver Function Tests: Recent Labs  Lab 07/07/21 1512 07/09/21 0402 07/10/21 0303  AST 16 27 26   ALT 17 17 16   ALKPHOS 79 76 62  BILITOT 0.5 0.6 0.5  PROT 6.6 6.5 5.5*  ALBUMIN 3.4* 3.2* 2.8*    No results for input(s): LIPASE, AMYLASE in the last 168 hours. Recent Labs  Lab 07/09/21 0756  AMMONIA 15    Coagulation Profile: No results for input(s): INR, PROTIME in the last 168 hours. Cardiac Enzymes: No results for input(s): CKTOTAL, CKMB, CKMBINDEX, TROPONINI in the last 168 hours. BNP (last 3 results) No results for input(s): PROBNP in the last 8760 hours. HbA1C: No results for input(s): HGBA1C in the last  72 hours. CBG: Recent Labs  Lab 07/12/21 0251 07/12/21 0620 07/12/21 1133 07/12/21 1805 07/13/21 0008  GLUCAP 87 91 110* 104* 149*    Lipid Profile: No results for input(s): CHOL, HDL, LDLCALC, TRIG, CHOLHDL, LDLDIRECT in the last 72 hours. Thyroid Function Tests: No results for input(s): TSH, T4TOTAL, FREET4, T3FREE, THYROIDAB in the last 72 hours.  Anemia Panel: No results for input(s): VITAMINB12,  FOLATE, FERRITIN, TIBC, IRON, RETICCTPCT in the last 72 hours.  Sepsis Labs: Recent Labs  Lab 07/10/21 0303  PROCALCITON 1.80  LATICACIDVEN 1.0     Recent Results (from the past 240 hour(s))  Resp Panel by RT-PCR (Flu A&B, Covid) Nasopharyngeal Swab     Status: Abnormal   Collection Time: 07/07/21  5:23 PM   Specimen: Nasopharyngeal Swab; Nasopharyngeal(NP) swabs in vial transport medium  Result Value Ref Range Status   SARS Coronavirus 2 by RT PCR POSITIVE (A) NEGATIVE Final    Comment: RESULT CALLED TO, READ BACK BY AND VERIFIED WITH: MELVIN, L.,RN 07/07/2021 2035 BY PAW. (NOTE) SARS-CoV-2 target nucleic acids are DETECTED.  The SARS-CoV-2 RNA is generally detectable in upper respiratory specimens during the acute phase of infection. Positive results are indicative of the presence of the identified virus, but do not rule out bacterial infection or co-infection with other pathogens not detected by the test. Clinical correlation with patient history and other diagnostic information is necessary to determine patient infection status. The expected result is Negative.  Fact Sheet for Patients: EntrepreneurPulse.com.au  Fact Sheet for Healthcare Providers: IncredibleEmployment.be  This test is not yet approved or cleared by the [place] FDA and  has been authorized for detection and/or diagnosis of SARS-CoV-2 by FDA under an Emergency Use Authorization (EUA).  This EUA will remain in effect (meaning this test ca n be used) for the duration of  the COVID-19 declaration under Section 564(b)(1) of the Act, 21 U.S.C. section 360bbb-3(b)(1), unless the authorization is terminated or revoked sooner.     Influenza A by PCR NEGATIVE NEGATIVE Final   Influenza B by PCR NEGATIVE NEGATIVE Final    Comment: (NOTE) The Xpert Xpress SARS-CoV-2/FLU/RSV plus assay is intended as an aid in the diagnosis of influenza from Nasopharyngeal swab  specimens and should not be used as a sole basis for treatment. Nasal washings and aspirates are unacceptable for Xpert Xpress SARS-CoV-2/FLU/RSV testing.  Fact Sheet for Patients: EntrepreneurPulse.com.au  Fact Sheet for Healthcare Providers: IncredibleEmployment.be  This test is not yet approved or cleared by the [place] FDA and has been authorized for detection and/or diagnosis of SARS-CoV-2 by FDA under an Emergency Use Authorization (EUA). This EUA will remain in effect (meaning this test can be used) for the duration of the COVID-19 declaration under Section 564(b)(1) of the Act, 21 U.S.C. section 360bbb-3(b)(1), unless the authorization is terminated or revoked.  Performed at [facility], [place] [address], [place], [place] [address]   MRSA Next Gen by PCR, Nasal     Status: None   Collection Time: 07/09/21  1:39 AM   Specimen: Nasal Mucosa; Nasal Swab  Result Value Ref Range Status   MRSA by PCR Next Gen NOT DETECTED NOT DETECTED Final    Comment: (NOTE) The GeneXpert MRSA Assay (FDA approved for NASAL specimens only), is one component of a comprehensive MRSA colonization surveillance program. It is not intended to diagnose MRSA infection nor to guide or monitor treatment for MRSA infections. Test performance is not FDA approved in patients less than 2  years old. Performed at [facility], [place] [address], [place], [place] [address]   Culture, blood (routine x 2)     Status: None (Preliminary result)   Collection Time: 07/10/21  3:03 AM   Specimen: BLOOD  Result Value Ref Range Status   Specimen Description BLOOD SITE NOT SPECIFIED  Final   Special Requests   Final    AEROBIC BOTTLE ONLY Blood Culture results may not be optimal due to an inadequate volume of blood received in culture bottles   Culture   Final    NO GROWTH 3 DAYS Performed at [facility], [place] [address], [place], [place] [address]     Report Status PENDING  Incomplete  Culture, blood (routine x 2)     Status: None (Preliminary result)   Collection Time: 07/10/21  3:03 AM   Specimen: BLOOD  Result Value Ref Range Status   Specimen Description BLOOD SITE NOT SPECIFIED  Final   Special Requests   Final    AEROBIC BOTTLE ONLY Blood Culture results may not be optimal due to an inadequate volume of blood received in culture bottles   Culture   Final    NO GROWTH 3 DAYS Performed at [facility], [place] [address] [place], [place] [address]    Report Status PENDING  Incomplete      Radiology Studies: No results found.   LOS: 5 days   Flora Lipps, MD [facility] 07/13/2021, 10:57 AM

## 2021-07-14 DIAGNOSIS — N179 Acute kidney failure, unspecified: Secondary | ICD-10-CM | POA: Diagnosis not present

## 2021-07-14 DIAGNOSIS — E870 Hyperosmolality and hypernatremia: Secondary | ICD-10-CM | POA: Diagnosis not present

## 2021-07-14 DIAGNOSIS — W19XXXA Unspecified fall, initial encounter: Secondary | ICD-10-CM | POA: Diagnosis not present

## 2021-07-14 DIAGNOSIS — N390 Urinary tract infection, site not specified: Secondary | ICD-10-CM | POA: Diagnosis not present

## 2021-07-14 DIAGNOSIS — I10 Essential (primary) hypertension: Secondary | ICD-10-CM | POA: Diagnosis not present

## 2021-07-14 LAB — CBC
HCT: 27.3 % — ABNORMAL LOW (ref 36.0–46.0)
Hemoglobin: 8.8 g/dL — ABNORMAL LOW (ref 12.0–15.0)
MCH: 29.2 pg (ref 26.0–34.0)
MCHC: 32.2 g/dL (ref 30.0–36.0)
MCV: 90.7 fL (ref 80.0–100.0)
Platelets: 188 10*3/uL (ref 150–400)
RBC: 3.01 MIL/uL — ABNORMAL LOW (ref 3.87–5.11)
RDW: 14 % (ref 11.5–15.5)
WBC: 4.3 10*3/uL (ref 4.0–10.5)
nRBC: 0 % (ref 0.0–0.2)

## 2021-07-14 LAB — BASIC METABOLIC PANEL
Anion gap: 6 (ref 5–15)
BUN: 17 mg/dL (ref 8–23)
CO2: 20 mmol/L — ABNORMAL LOW (ref 22–32)
Calcium: 9.9 mg/dL (ref 8.9–10.3)
Chloride: 114 mmol/L — ABNORMAL HIGH (ref 98–111)
Creatinine, Ser: 1.49 mg/dL — ABNORMAL HIGH (ref 0.44–1.00)
GFR, Estimated: 38 mL/min — ABNORMAL LOW (ref >60)
Glucose, Bld: 132 mg/dL — ABNORMAL HIGH (ref 70–99)
Potassium: 3.8 mmol/L (ref 3.5–5.1)
Sodium: 140 mmol/L (ref 135–145)

## 2021-07-14 LAB — GLUCOSE, CAPILLARY
Glucose-Capillary: 122 mg/dL — ABNORMAL HIGH (ref 70–99)
Glucose-Capillary: 92 mg/dL (ref 70–99)
Glucose-Capillary: 87 mg/dL (ref 70–99)
Glucose-Capillary: 82 mg/dL (ref 70–99)

## 2021-07-14 LAB — MAGNESIUM: Magnesium: 2.1 mg/dL (ref 1.7–2.4)

## 2021-07-14 MED ORDER — SODIUM CHLORIDE 0.9 % IV SOLN: INTRAVENOUS | Status: DC

## 2021-07-14 MED ORDER — AMOXICILLIN-POT CLAVULANATE 500-125 MG PO TABS
1.0000 | ORAL_TABLET | Freq: Two times a day (BID) | ORAL | Status: AC
  Administered 2021-07-14 – 2021-07-16 (×5): 500 mg via ORAL
  Filled 2021-07-14 (×6): qty 1

## 2021-07-14 MED ORDER — LOPERAMIDE HCL 2 MG PO CAPS
2.0000 mg | ORAL_CAPSULE | ORAL | Status: DC | PRN
  Administered 2021-07-14 – 2021-07-17 (×2): 2 mg via ORAL
  Filled 2021-07-14 (×3): qty 1

## 2021-07-14 NOTE — Progress Notes (Signed)
PROGRESS NOTE    Joanna [name]  SVX:[id number] DOB: 1951-05-25 DOA: 07/07/2021 PCP: [name], MD   Brief Narrative/Hospital Course: 70 year old female with past medical history of diabetes mellitus, CKD stage II, hypertension, hypothyroidism, idiopathic left upper extremity spasm hyperplasia, chronic urinary incontinence, recent UTI was sent from the skilled nursing facility with complaints of confusion decreased oral intake.  In the ED patient was noted to have elevated blood pressure and had mild acute kidney injury with creatinine of 2.2 from baseline of 1.1.  COVID-19 was positive.  CT head scan was negative for acute finding.  Chest x-ray was unremarkable.  Ultrasound of the kidneys showed hyperechoic kidneys.  Patient was then admitted to hospital with IV fluids.  She did have fever spike during hospitalization concerning for pneumonia so Zosyn and Zithromax was initiated and speech therapy was consulted.  Patient was then admitted hospital for further evaluation and treatment. She was treated for acute metabolic encephalopathy deconditioning in the setting of a COVID-19 infection but not hypoxic treated with remdesivir, IV fluid hydration due to AKI and CKD.  At this time she is medically stabilized improved will need further PT OT and rehab placement and waiting on placement  Subjective Seen examined this morning.  She is alert awake oriented x3, appears weak with generalized weakness, no other new complaints  Assessment & Plan:  Acute metabolic encephalopathy: Resolved.  Suspect multifactorial from volume depletion dehydration COVID-19 infection.  CT head scan MRI was negative for any acute findings but cerebral atrophy.  Continue supportive care  Fall/near syncope, lightheadedness Resolved.  Suspect due to dehydration and acute kidney injury.  COVID IV fluid hydration.  2D echocardiogram 2 months back was within normal limits with LV ejection fraction of 60 to 65%.  Managed w/ IV  fluids, continue PT OT, pending placement   AKI on CKD 2 Baseline creatinine 1.1. Creatinine peaked to 2.2 at this time improved close to baseline.  Encourage oral intake. Recent Labs  Lab 07/10/21 0303 07/11/21 0856 07/12/21 0639 07/13/21 0509 07/14/21 0656  BUN 27* 31* 28* 17 17  CREATININE 1.64* 1.76* 1.61* 1.33* 1.49*    Hypercalcemia: Resolved suspect due to contraction/dehydration  Hypoglycemia resolved at this time.  off D5 normal saline for now.  Encourage oral intake. Recent Labs  Lab 07/13/21 0008 07/13/21 1210 07/13/21 1812 07/13/21 2310 07/14/21 0634  GLUCAP 149* 81 80 82 122*    COVID-19 positive: Patient had generalized weakness. CRP mildly elevated.  Not hypoxic so no need for steroids.  Completed course of remdesivir.  Fever secondary to pneumonia. Blood cultures negative in 3 days.  Chest x-ray shows right infra hilar airspace disease.  Procalcitonin was elevated at 1.8.  managed with IV antibiotics Zosyn and Zithromax.  SLP  has seen. Change to augmentin x 7 days total  Iron deficiency anemia. Hemoglobin stable.  Continue iron supplementation  Recent Labs  Lab 07/10/21 0303 07/11/21 0856 07/12/21 0639 07/13/21 0509 07/14/21 0656  HGB 9.9* 7.7* 7.9* 7.9* 8.8*  HCT 30.8* 24.9* 24.9* 25.3* 27.3*    Hypertension:controlled.HCTZ and ARB on hold due to AKI and will not resume. Continue hydralazine for now.   Hypothyroidism: Continue Synthroid   Chronic left upper extremity spastic weakness/Debility/deconditioning; Physical therapy Occupational Therapy on board and recommended skilled nursing facility placement on discharge.  Patient unable to take care of herself   DVT prophylaxis: heparin injection 5,000 Units Start: 07/07/21 2200 Code Status:    Code Status: Full Code   Family  Communication: No one at bedside. Status is: Inpatient Patient remains inpatient due to need for IV medications, IV antibiotic, rehabilitation placement Dispo: The patient  is from: SNF              Anticipated d/c is to: SNF when insurance is authorized.              Patient currently is medically stable to d/c.   Difficult to place patient No  Objective:  Today's Vitals   07/13/21 2302 07/14/21 0500 07/14/21 0524 07/14/21 1029  BP: (!) 154/100  (!) 141/95 126/83  Pulse: 94  95 93  Resp: 16  18 18   Temp: 98.5 F (36.9 C)  97.7 F (36.5 C) 97.9 F (36.6 C)  TempSrc: Oral  Oral   SpO2: 97%  100% 98%  Weight:  53.1 kg    Height:      PainSc:       Physical examination: General exam: AAOx 3, frail and weak looking, HEENT:Oral mucosa moist, Ear/Nose WNL grossly, dentition normal. Respiratory system: bilaterally diminished,no use of accessory muscle Cardiovascular system: S1 & S2 +, No JVD,. Gastrointestinal system: Abdomen soft, NT,ND, BS+ Nervous System:Alert, awake, weak left side spastic left arm, moving extremities and grossly nonfocal Extremities: No edema, distal peripheral pulses palpable.  Skin: No rashes,no icterus.  Intake/Output Summary (Last 24 hours) at 07/14/2021 1105 Last data filed at 07/14/2021 0800 Gross per 24 hour  Intake 570 ml  Output 350 ml  Net 220 ml    Filed Weights   07/12/21 2123 07/13/21 0637 07/14/21 0500  Weight: 52.9 kg 52.9 kg 53.1 kg   Weight change: 0.2 kg   Consultants: None  Procedures: None  Antimicrobials:  Anti-infectives (From admission, onward)    Start     Dose/Rate Route Frequency Ordered Stop   07/10/21 1045  piperacillin-tazobactam (ZOSYN) IVPB 3.375 g        3.375 g 12.5 mL/hr over 240 Minutes Intravenous Every 8 hours 07/10/21 0954     07/10/21 1000  azithromycin (ZITHROMAX) tablet 500 mg        500 mg Oral Daily 07/10/21 0842 07/12/21 1114   07/09/21 1000  remdesivir 100 mg in sodium chloride 0.9 % 100 mL IVPB       See Hyperspace for full Linked Orders Report.   100 mg 200 mL/hr over 30 Minutes Intravenous Daily 07/08/21 1040 07/10/21 1014   07/08/21 1130  remdesivir 200 mg in  sodium chloride 0.9% 250 mL IVPB       See Hyperspace for full Linked Orders Report.   200 mg 580 mL/hr over 30 Minutes Intravenous Once 07/08/21 1040 07/08/21 1415      Culture/Microbiology    Component Value Date/Time   SDES BLOOD SITE NOT SPECIFIED 07/10/2021 0303   SDES BLOOD SITE NOT SPECIFIED 07/10/2021 0303   SPECREQUEST  07/10/2021 0303    AEROBIC BOTTLE ONLY Blood Culture results may not be optimal due to an inadequate volume of blood received in culture bottles   SPECREQUEST  07/10/2021 0303    AEROBIC BOTTLE ONLY Blood Culture results may not be optimal due to an inadequate volume of blood received in culture bottles   CULT  07/10/2021 0303    NO GROWTH 3 DAYS Performed at [facility], [place] [address], [place], [place] [address]    CULT  07/10/2021 0303    NO GROWTH 3 DAYS Performed at [facility] [address] [place], [place] [address]  REPTSTATUS PENDING 07/10/2021 0303   REPTSTATUS PENDING 07/10/2021 0303    Other culture-see note  Unresulted Labs (From admission, onward)     Start     Ordered   07/09/21 2330  Urinalysis, Complete w Microscopic Urine, Clean Catch  Once,   R        07/09/21 2329          medications reviewed:  Scheduled Meds:  vitamin C  500 mg Oral Daily   famotidine  20 mg Oral Daily   feeding supplement  237 mL Oral BID BM   ferrous sulfate  325 mg Oral Q supper   heparin  5,000 Units Subcutaneous Q12H   levothyroxine  88 mcg Oral Q0600   loratadine  10 mg Oral Daily   multivitamin with minerals  1 tablet Oral Daily   rosuvastatin  20 mg Oral Daily   thiamine  100 mg Oral Daily   topiramate  50 mg Oral BID   traZODone  25-50 mg Oral QHS   zinc sulfate  220 mg Oral Daily   Continuous Infusions:  piperacillin-tazobactam (ZOSYN)  IV 3.375 g (07/14/21 0518)     Intake/Output from previous day: 09/28 0701 - 09/29 0700 In: 510 [P.O.:510] Out: 350 [Urine:350] Intake/Output this shift: Total I/O In: 120  [P.O.:120] Out: -  Filed Weights   07/12/21 2123 07/13/21 0637 07/14/21 0500  Weight: 52.9 kg 52.9 kg 53.1 kg   Data Reviewed: I have personally reviewed the following labs and imaging studies.    CBC: Recent Labs  Lab 07/10/21 0303 07/11/21 0856 07/12/21 0639 07/13/21 0509 07/14/21 0656  WBC 9.8 3.6* 4.1 4.4 4.3  HGB 9.9* 7.7* 7.9* 7.9* 8.8*  HCT 30.8* 24.9* 24.9* 25.3* 27.3*  MCV 91.7 93.3 90.9 92.3 90.7  PLT 164 151 112* 167 785    Basic Metabolic Panel: Recent Labs  Lab 07/07/21 1512 07/07/21 1618 07/09/21 0402 07/10/21 0303 07/11/21 0856 07/12/21 0639 07/13/21 0509 07/14/21 0656  NA 137   < > 138 135 140 140 140 140  K 4.3   < > 4.1 3.6 3.7 3.9 3.6 3.8  CL 103   < > 107 107 114* 116* 117* 114*  CO2 22   < > 20* 18* 19* 19* 18* 20*  GLUCOSE 105*   < > 66* 130* 100* 97 116* 132*  BUN 38*   < > 26* 27* 31* 28* 17 17  CREATININE 2.24*   < > 1.64* 1.64* 1.76* 1.61* 1.33* 1.49*  CALCIUM 11.1*   < > 10.3 9.7 9.2 9.2 9.1 9.9  MG 2.5*  --  1.9  --   --   --   --  2.1   < > = values in this interval not displayed.    GFR: Estimated Creatinine Clearance: 28.2 mL/min (A) (by C-G formula based on SCr of 1.49 mg/dL (H)). Liver Function Tests: Recent Labs  Lab 07/07/21 1512 07/09/21 0402 07/10/21 0303  AST 16 27 26   ALT 17 17 16   ALKPHOS 79 76 62  BILITOT 0.5 0.6 0.5  PROT 6.6 6.5 5.5*  ALBUMIN 3.4* 3.2* 2.8*    No results for input(s): LIPASE, AMYLASE in the last 168 hours. Recent Labs  Lab 07/09/21 0756  AMMONIA 15    Coagulation Profile: No results for input(s): INR, PROTIME in the last 168 hours. Cardiac Enzymes: No results for input(s): CKTOTAL, CKMB, CKMBINDEX, TROPONINI in the last 168 hours. BNP (last 3 results) No results for input(s): PROBNP in  the last 8760 hours. HbA1C: No results for input(s): HGBA1C in the last 72 hours. CBG: Recent Labs  Lab 07/13/21 0008 07/13/21 1210 07/13/21 1812 07/13/21 2310 07/14/21 0634  GLUCAP 149* 81  80 82 122*    Lipid Profile: No results for input(s): CHOL, HDL, LDLCALC, TRIG, CHOLHDL, LDLDIRECT in the last 72 hours. Thyroid Function Tests: No results for input(s): TSH, T4TOTAL, FREET4, T3FREE, THYROIDAB in the last 72 hours.  Anemia Panel: No results for input(s): VITAMINB12, FOLATE, FERRITIN, TIBC, IRON, RETICCTPCT in the last 72 hours.  Sepsis Labs: Recent Labs  Lab 07/10/21 0303  PROCALCITON 1.80  LATICACIDVEN 1.0     Recent Results (from the past 240 hour(s))  Resp Panel by RT-PCR (Flu A&B, Covid) Nasopharyngeal Swab     Status: Abnormal   Collection Time: 07/07/21  5:23 PM   Specimen: Nasopharyngeal Swab; Nasopharyngeal(NP) swabs in vial transport medium  Result Value Ref Range Status   SARS Coronavirus 2 by RT PCR POSITIVE (A) NEGATIVE Final    Comment: RESULT CALLED TO, READ BACK BY AND VERIFIED WITH: MELVIN, L.,RN 07/07/2021 2035 BY PAW. (NOTE) SARS-CoV-2 target nucleic acids are DETECTED.  The SARS-CoV-2 RNA is generally detectable in upper respiratory specimens during the acute phase of infection. Positive results are indicative of the presence of the identified virus, but do not rule out bacterial infection or co-infection with other pathogens not detected by the test. Clinical correlation with patient history and other diagnostic information is necessary to determine patient infection status. The expected result is Negative.  Fact Sheet for Patients: EntrepreneurPulse.com.au  Fact Sheet for Healthcare Providers: IncredibleEmployment.be  This test is not yet approved or cleared by the [place] FDA and  has been authorized for detection and/or diagnosis of SARS-CoV-2 by FDA under an Emergency Use Authorization (EUA).  This EUA will remain in effect (meaning this test ca n be used) for the duration of  the COVID-19 declaration under Section 564(b)(1) of the Act, 21 U.S.C. section 360bbb-3(b)(1), unless the  authorization is terminated or revoked sooner.     Influenza A by PCR NEGATIVE NEGATIVE Final   Influenza B by PCR NEGATIVE NEGATIVE Final    Comment: (NOTE) The Xpert Xpress SARS-CoV-2/FLU/RSV plus assay is intended as an aid in the diagnosis of influenza from Nasopharyngeal swab specimens and should not be used as a sole basis for treatment. Nasal washings and aspirates are unacceptable for Xpert Xpress SARS-CoV-2/FLU/RSV testing.  Fact Sheet for Patients: EntrepreneurPulse.com.au  Fact Sheet for Healthcare Providers: IncredibleEmployment.be  This test is not yet approved or cleared by the [place] FDA and has been authorized for detection and/or diagnosis of SARS-CoV-2 by FDA under an Emergency Use Authorization (EUA). This EUA will remain in effect (meaning this test can be used) for the duration of the COVID-19 declaration under Section 564(b)(1) of the Act, 21 U.S.C. section 360bbb-3(b)(1), unless the authorization is terminated or revoked.  Performed at [facility], [place] [address], [place], [place] [address]   MRSA Next Gen by PCR, Nasal     Status: None   Collection Time: 07/09/21  1:39 AM   Specimen: Nasal Mucosa; Nasal Swab  Result Value Ref Range Status   MRSA by PCR Next Gen NOT DETECTED NOT DETECTED Final    Comment: (NOTE) The GeneXpert MRSA Assay (FDA approved for NASAL specimens only), is one component of a comprehensive MRSA colonization surveillance program. It is not intended to diagnose MRSA infection nor to guide or monitor treatment for  MRSA infections. Test performance is not FDA approved in patients less than 58 years old. Performed at [facility], [place] [address] [place], [place] [address]   Culture, blood (routine x 2)     Status: None (Preliminary result)   Collection Time: 07/10/21  3:03 AM   Specimen: BLOOD  Result Value Ref Range Status   Specimen Description BLOOD SITE NOT SPECIFIED   Final   Special Requests   Final    AEROBIC BOTTLE ONLY Blood Culture results may not be optimal due to an inadequate volume of blood received in culture bottles   Culture   Final    NO GROWTH 3 DAYS Performed at [facility], [place] [address] [place], [place] [address]    Report Status PENDING  Incomplete  Culture, blood (routine x 2)     Status: None (Preliminary result)   Collection Time: 07/10/21  3:03 AM   Specimen: BLOOD  Result Value Ref Range Status   Specimen Description BLOOD SITE NOT SPECIFIED  Final   Special Requests   Final    AEROBIC BOTTLE ONLY Blood Culture results may not be optimal due to an inadequate volume of blood received in culture bottles   Culture   Final    NO GROWTH 3 DAYS Performed at [facility], [place] [address], [place], [place] [address]    Report Status PENDING  Incomplete      Radiology Studies: No results found.   LOS: 6 days   Antonieta Pert, MD [facility] 07/14/2021, 11:05 AM

## 2021-07-14 NOTE — Progress Notes (Signed)
Dr. Lupita Leash notified that patient has been having multiple loose stools and that patient is not eating. IV fluids and imodium ordered.

## 2021-07-14 NOTE — TOC Progression Note (Signed)
Transition of Care [facility]) - Progression Note    Patient Details  Name: Joanna [name] MRN: [id number] Date of Birth: July 31, 1951  Transition of [facility]) CM/SW Contact  Sharlet Salina Mila Homer, LCSW Phone Number: 07/14/2021, 5:49 PM  Clinical Narrative:  CSW contacted [employer] and confirmed that [facility] accepts their insurance. Talked with Perrin Smack, admissions director at [facility] regarding patient and she will review patient's information today if she can (leaving early today) or tomorrow morning. CSW will follow-up with [facility] tomorrow regarding patient.      Expected Discharge Plan: Skilled Nursing Facility Barriers to Discharge: Continued Medical Work up  Expected Discharge Plan and Services Expected Discharge Plan: [place] In-house Referral: Clinical Social Work   Post Acute Care Choice: [place] Living arrangements for the past 2 months: Apartment                           HH Arranged: NA [place] Agency: NA         Social Determinants of Health (SDOH) Interventions  No SDOH interventions requested or needed at this time  Readmission Risk Interventions Readmission Risk Prevention Plan 06/27/2021  Transportation Screening Complete  HRI or [place] Complete  Social Work Consult for [place] Planning/Counseling Complete  Palliative Care Screening Not Applicable  Medication Review Press [employer]) Complete  Some recent data might be hidden

## 2021-07-14 NOTE — Progress Notes (Signed)
Occupational Therapy Treatment Patient Details Name: Joanna [name] MRN: [id number] DOB: 05-Jul-1951 Today's Date: 07/14/2021   History of present illness 70 y.o. female presents to [facility] with increasing confusion and fall. Pt found to be COVID-19+ in ED. Pt admitted for AKI and acute metabolic encephalopathy. PMH includes IIDM, CKD stage II, HTN, hypothyroidism, idiopathic left upper extremity spasm hyperplasia,, chronic urinary incontinence, recent UTI.   OT comments  Patient in bed and willing to get up for lunch.  Patient was not very verbal and did not often answer questions. Patient was mod assist for bed mobility and transfer to recliner. Therapist provided setup and verbal cues to initiate self feeding but patient required assistance with loading utensils and often hand over hand to initiate self feeding. Patient had soiled self before we return to bed and patient stood with rw with mod to max assist for balance while tech assist with cleaning. Patient was returned to supine.  Acute OT to continue to follow.    Recommendations for follow up therapy are one component of a multi-disciplinary discharge planning process, led by the attending physician.  Recommendations may be updated based on patient status, additional functional criteria and insurance authorization.    Follow Up Recommendations  SNF    Equipment Recommendations  Wheelchair (measurements OT);Wheelchair cushion (measurements OT)    Recommendations for Other Services      Precautions / Restrictions Precautions Precautions: Fall Precaution Comments: LUE contracture       Mobility Bed Mobility Overal bed mobility: Needs Assistance Bed Mobility: Supine to Sit;Sit to Supine     Supine to sit: Mod assist Sit to supine: Mod assist   General bed mobility comments: required assistance with LEs and mod verbal cues    Transfers Overall transfer level: Needs assistance Equipment used: None Transfers:  Squat Pivot Transfers;Sit to/from Stand Sit to Stand: Mod assist Stand pivot transfers: Mod assist       General transfer comment: Performed transfer from eob to recliner and back with SPT    Balance Overall balance assessment: Needs assistance Sitting-balance support: No upper extremity supported;Feet supported Sitting balance-Leahy Scale: Poor Sitting balance - Comments: min guard for sitting balance sitting on eob Postural control: Right lateral lean Standing balance support: Single extremity supported;During functional activity Standing balance-Leahy Scale: Poor Standing balance comment: Stood with RUE holding walker and therapist assisting with LUE and trunk                           ADL either performed or assessed with clinical judgement   ADL Overall ADL's : Needs assistance/impaired Eating/Feeding: Sitting;Moderate assistance Eating/Feeding Details (indicate cue type and reason): required assistance with loading utencil and occasional Hand over hand to intiate self feeding Grooming: Wash/dry hands;Wash/dry face;Minimal assistance;Sitting Grooming Details (indicate cue type and reason): performed seated                               General ADL Comments: performed self feeding and grooming seated in recliner. Stood from recliner for cleaning following BM.     Vision       Perception     Praxis      Cognition Arousal/Alertness: Awake/alert Behavior During Therapy: Flat affect Overall Cognitive Status: No family/caregiver present to determine baseline cognitive functioning Area of Impairment: Orientation;Following commands;Safety/judgement;Awareness;Problem solving  Orientation Level: Disoriented to;Time;Situation     Following Commands: Follows one step commands inconsistently;Follows one step commands with increased time Safety/Judgement: Decreased awareness of deficits;Decreased awareness of safety Awareness:  Intellectual Problem Solving: Slow processing;Requires verbal cues;Requires tactile cues General Comments: Patient did not respond to orientation question        Exercises     Shoulder Instructions       General Comments      Pertinent Vitals/ Pain       Pain Assessment: No/denies pain  Home Living                                          Prior Functioning/Environment              Frequency  Min 2X/week        Progress Toward Goals  OT Goals(current goals can now be found in the care plan section)  Progress towards OT goals: Progressing toward goals  Acute Rehab OT Goals Patient Stated Goal: did not state OT Goal Formulation: Patient unable to participate in goal setting Time For Goal Achievement: 07/23/21 Potential to Achieve Goals: Good ADL Goals Pt Will Perform Eating: with set-up;sitting Pt Will Perform Grooming: with set-up;sitting Pt Will Transfer to Toilet: with modified independence;ambulating;grab bars Pt Will Perform Toileting - Clothing Manipulation and hygiene: with modified independence;sit to/from stand;sitting/lateral leans Pt/caregiver will Perform Home Exercise Program: Increased strength;Right Upper extremity;Independently;With written HEP provided Additional ADL Goal #1: Patient will supine to sit with Min a of one to increase independence with toileting.  Plan Discharge plan remains appropriate    Co-evaluation                 AM-PAC OT "6 Clicks" Daily Activity     Outcome Measure   Help from another person eating meals?: A Lot Help from another person taking care of personal grooming?: A Lot Help from another person toileting, which includes using toliet, bedpan, or urinal?: Total Help from another person bathing (including washing, rinsing, drying)?: Total Help from another person to put on and taking off regular upper body clothing?: Total Help from another person to put on and taking off regular lower  body clothing?: Total 6 Click Score: 8    End of Session Equipment Utilized During Treatment: Rolling walker  OT Visit Diagnosis: Muscle weakness (generalized) (M62.81);Unsteadiness on feet (R26.81);Other symptoms and signs involving cognitive function;Feeding difficulties (R63.3)   Activity Tolerance Patient tolerated treatment well   Patient Left in chair;with call bell/phone within reach;with bed alarm set   Nurse Communication Other (comment) (discussed with nurse patient's intake)        Time: 1610-9604 OT Time Calculation (min): 45 min  Charges: OT General Charges $OT Visit: 1 Visit OT Treatments $Self Care/Home Management : 38-52 mins  Lodema Hong, [place] 07/14/2021, 12:56 PM

## 2021-07-14 NOTE — Plan of Care (Signed)
  Problem: Clinical Measurements: Goal: Will remain free from infection Outcome: Progressing Goal: Diagnostic test results will improve Outcome: Progressing   Problem: Nutrition: Goal: Adequate nutrition will be maintained Outcome: Progressing   Problem: Coping: Goal: Level of anxiety will decrease Outcome: Progressing   Problem: Safety: Goal: Ability to remain free from injury will improve Outcome: Progressing

## 2021-07-14 NOTE — Progress Notes (Signed)
PHARMACY NOTE:  ANTIMICROBIAL RENAL DOSAGE ADJUSTMENT  Current antimicrobial regimen includes a mismatch between antimicrobial dosage and estimated renal function.  As per policy approved by the Pharmacy & Therapeutics and Medical Executive Committees, the antimicrobial dosage will be adjusted accordingly.  Current antimicrobial dosage:  Augmentin 875mg  every 12 hours x 3 days  Indication:  pneumonia  Renal Function:   AKI on CKD stage II Estimated Creatinine Clearance: 28.2 mL/min (A) (by C-G formula based on SCr of 1.49 mg/dL (H)).  []      On intermittent HD, scheduled: []      On CRRT    Antimicrobial dosage has been changed to:  Augmentin 500 mg po every 12 hours x 3 days  Additional comments:   Thank you for allowing pharmacy to be a part of this patient's care.  Nicole Cella, RPh Clinical Pharmacist [phone] 07/14/2021 11:36 AM

## 2021-07-15 DIAGNOSIS — N179 Acute kidney failure, unspecified: Secondary | ICD-10-CM | POA: Diagnosis not present

## 2021-07-15 LAB — CULTURE, BLOOD (ROUTINE X 2)
Culture: NO GROWTH
Culture: NO GROWTH

## 2021-07-15 LAB — CBC
HCT: 26.4 % — ABNORMAL LOW (ref 36.0–46.0)
Hemoglobin: 8.3 g/dL — ABNORMAL LOW (ref 12.0–15.0)
MCH: 28.8 pg (ref 26.0–34.0)
MCHC: 31.4 g/dL (ref 30.0–36.0)
MCV: 91.7 fL (ref 80.0–100.0)
Platelets: 216 10*3/uL (ref 150–400)
RBC: 2.88 MIL/uL — ABNORMAL LOW (ref 3.87–5.11)
RDW: 14.2 % (ref 11.5–15.5)
WBC: 6.2 10*3/uL (ref 4.0–10.5)
nRBC: 0 % (ref 0.0–0.2)

## 2021-07-15 LAB — BASIC METABOLIC PANEL
Anion gap: 6 (ref 5–15)
BUN: 20 mg/dL (ref 8–23)
CO2: 20 mmol/L — ABNORMAL LOW (ref 22–32)
Calcium: 9.7 mg/dL (ref 8.9–10.3)
Chloride: 114 mmol/L — ABNORMAL HIGH (ref 98–111)
Creatinine, Ser: 1.55 mg/dL — ABNORMAL HIGH (ref 0.44–1.00)
GFR, Estimated: 36 mL/min — ABNORMAL LOW (ref >60)
Glucose, Bld: 128 mg/dL — ABNORMAL HIGH (ref 70–99)
Potassium: 3.7 mmol/L (ref 3.5–5.1)
Sodium: 140 mmol/L (ref 135–145)

## 2021-07-15 LAB — GLUCOSE, CAPILLARY
Glucose-Capillary: 91 mg/dL (ref 70–99)
Glucose-Capillary: 154 mg/dL — ABNORMAL HIGH (ref 70–99)

## 2021-07-15 NOTE — Progress Notes (Signed)
Physical Therapy Treatment Patient Details Name: Joanna [name] MRN: [id number] DOB: 11/08/1950 Today's Date: 07/15/2021   History of Present Illness 70 y.o. female presents to [facility] hspital on 07/07/2021 with increasing confusion and fall. Pt found to be COVID-19+ in ED. Pt admitted for AKI and acute metabolic encephalopathy. PMH includes IIDM, CKD stage II, HTN, hypothyroidism, idiopathic left upper extremity spasm hyperplasia,, chronic urinary incontinence, recent UTI.    PT Comments    Pt tolerates treatment well but remains dependent on physical assistance to safely mobilize at this time. Pt demonstrates poor initiation and requires multiple tactile and verbal cues to participate in mobility. Pt is at a high falls risk due to imbalance and strength deficits. Pt will benefit from aggressive mobilization and PT services to improve mobility quality and reduce falls risk. PT continues to recommend Snf placement.   Recommendations for follow up therapy are one component of a multi-disciplinary discharge planning process, led by the attending physician.  Recommendations may be updated based on patient status, additional functional criteria and insurance authorization.  Follow Up Recommendations  SNF     Equipment Recommendations  Wheelchair (measurements PT);Wheelchair cushion (measurements PT);Hospital bed    Recommendations for Other Services       Precautions / Restrictions Precautions Precautions: Fall Precaution Comments: LUE contracture Restrictions Weight Bearing Restrictions: No     Mobility  Bed Mobility Overal bed mobility: Needs Assistance Bed Mobility: Supine to Sit     Supine to sit: Max assist;HOB elevated     General bed mobility comments: pt requires physical assistance for LEs as well as tactile cues and assistance to elevate trunk into sitting, no use of LUE noted despite frequent encouragement from PT    Transfers Overall transfer level: Needs  assistance Equipment used: 1 person hand held assist Transfers: Sit to/from [employer] Sit to Stand: Mod assist   Squat pivot transfers: Max assist     General transfer comment: pt benefits from UE support of PT as well as knee block to initiate stand. Pt requiring physical assistance to initiate pivot from bed to recliner  Ambulation/Gait                 Stairs             Wheelchair Mobility    Modified Rankin (Stroke Patients Only)       Balance Overall balance assessment: Needs assistance Sitting-balance support: Single extremity supported;Feet supported Sitting balance-Leahy Scale: Poor Sitting balance - Comments: pt with rightward loss of balance Postural control: Right lateral lean Standing balance support: Bilateral upper extremity supported Standing balance-Leahy Scale: Poor Standing balance comment: reliant on UE support of hand hold                            Cognition Arousal/Alertness: Awake/alert Behavior During Therapy: Flat affect Overall Cognitive Status: Impaired/Different from baseline Area of Impairment: Following commands;Safety/judgement;Awareness;Problem solving;Memory                   Current Attention Level: Focused Memory: Decreased short-term memory Following Commands: Follows one step commands with increased time;Follows one step commands inconsistently Safety/Judgement: Decreased awareness of safety;Decreased awareness of deficits Awareness: Intellectual Problem Solving: Slow processing;Decreased initiation;Requires verbal cues;Requires tactile cues        Exercises      General Comments General comments (skin integrity, edema, etc.): VSS on RA. Pt incontinent of stool upon PT arrival  Pertinent Vitals/Pain Pain Assessment: Faces Faces Pain Scale: Hurts little more Pain Location: generalized- pt reports being unable to identify where the pain is coming from Pain Descriptors /  Indicators: Grimacing Pain Intervention(s): Monitored during session    Home Living                      Prior Function            PT Goals (current goals can now be found in the care plan section) Acute Rehab PT Goals Patient Stated Goal: did not state Progress towards PT goals: Progressing toward goals (slowly)    Frequency    Min 2X/week      PT Plan Current plan remains appropriate    Co-evaluation              AM-PAC PT "6 Clicks" Mobility   Outcome Measure  Help needed turning from your back to your side while in a flat bed without using bedrails?: A Lot Help needed moving from lying on your back to sitting on the side of a flat bed without using bedrails?: A Lot Help needed moving to and from a bed to a chair (including a wheelchair)?: A Lot Help needed standing up from a chair using your arms (e.g., wheelchair or bedside chair)?: A Lot Help needed to walk in hospital room?: Total Help needed climbing 3-5 steps with a railing? : Total 6 Click Score: 10    End of Session   Activity Tolerance: Patient tolerated treatment well Patient left: in chair;with call bell/phone within reach;with chair alarm set Nurse Communication: Mobility status PT Visit Diagnosis: Other abnormalities of gait and mobility (R26.89);Muscle weakness (generalized) (M62.81);History of falling (Z91.81);Other symptoms and signs involving the nervous system (R29.898)     Time: 3154-0086 PT Time Calculation (min) (ACUTE ONLY): 42 min  Charges:  $Therapeutic Activity: 38-52 mins                     Zenaida Niece, PT, DPT Acute Rehabilitation Pager: [phone]    Zenaida Niece 07/15/2021, 3:33 PM

## 2021-07-15 NOTE — Plan of Care (Signed)
  Problem: Clinical Measurements: Goal: Ability to maintain clinical measurements within normal limits will improve Outcome: Progressing Goal: Will remain free from infection Outcome: Progressing   

## 2021-07-15 NOTE — TOC Progression Note (Signed)
Transition of Care [facility]) - Progression Note    Patient Details  Name: IYSHA MISHKIN MRN: [id number] Date of Birth: April 03, 1951  Transition of [facility]) CM/SW Contact  Sharlet Salina Mila Homer, LCSW Phone Number: 07/15/2021, 11:50 AM  Clinical Narrative:  Follow-up call made to [facility] at [facility] regarding accepting patient. Discussed her COVID status and was advised that they would not be able to take patient until day 11 from testing positive for COVID, and because day 11, October 2nd is on a Sunday, they could take her on Monday, pending insurance auth and a negative COVID test and no COVID symptoms.  Narda Rutherford, admissions director at [facility] contacted regarding patient and they can take pending bed availability and insurance auth.  MD updated.      Expected Discharge Plan: Skilled Nursing Facility Barriers to Discharge: Continued Medical Work up  Expected Discharge Plan and Services Expected Discharge Plan: [place] In-house Referral: Clinical Social Work   Post Acute Care Choice: [place] Living arrangements for the past 2 months: Apartment                           HH Arranged: NA [place] Agency: NA         Social Determinants of Health (SDOH) Interventions  NonSDOH interventions requested or needed at this time  Readmission Risk Interventions Readmission Risk Prevention Plan 06/27/2021  Transportation Screening Complete  HRI or [place] Complete  Social Work Consult for [place] Planning/Counseling Complete  Palliative Care Screening Not Applicable  Medication Review Press photographer) Complete  Some recent data might be hidden

## 2021-07-15 NOTE — Progress Notes (Signed)
PROGRESS NOTE    Joanna [name]  KPT:[id number] DOB: 02-Aug-1951 DOA: 07/07/2021 PCP: [name], MD   Brief Narrative/Hospital Course: 70 year old female with past medical history of diabetes mellitus, CKD stage II, hypertension, hypothyroidism, idiopathic left upper extremity spasm hyperplasia, chronic urinary incontinence, recent UTI was sent from the skilled nursing facility with complaints of confusion decreased oral intake.  In the ED patient was noted to have elevated blood pressure and had mild acute kidney injury with creatinine of 2.2 from baseline of 1.1.  COVID-19 was positive.  CT head scan was negative for acute finding.  Chest x-ray was unremarkable.  Ultrasound of the kidneys showed hyperechoic kidneys.  Patient was then admitted to hospital with IV fluids.  She did have fever spike during hospitalization concerning for pneumonia so Zosyn and Zithromax was initiated and speech therapy was consulted.  Patient was then admitted hospital for further evaluation and treatment. She was treated for acute metabolic encephalopathy deconditioning in the setting of a COVID-19 infection but not hypoxic treated with remdesivir, IV fluid hydration due to AKI and CKD.  At this time she is medically stabilized improved will need further PT OT and rehab placement and waiting on placement 9/29 patient has not been eating well and also had multiple episodes of loose stool  Subjective Seen and examined this morning.  Patient is alert awake at baseline. Received Imodium yesterday afternoon, 3 PM yesterday, nursing this morning is not aware of diarrhea.    Assessment & Plan:  Acute metabolic encephalopathy: Resolved.  Suspect multifactorial from volume depletion dehydration COVID-19 infection.  CT head scan MRI was negative for any acute findings but cerebral atrophy.  Continue supportive care  Fall/near syncope, lightheadedness Resolved.  Suspect due to dehydration and acute kidney injury.  COVID  IV fluid hydration.  2D echocardiogram 2 months back was within normal limits with LV ejection fraction of 60 to 65%.  Managed w/ IV fluids, continue PT OT, pending placement   AKI on CKD 2 Baseline creatinine 1.1.  Creatinine is further uptrending likely from poor intake and also diarrhea.  Diarrhea seems to have subsided we will need to monitor next 24 hours, IV fluids has been increased 200 mill per hour, repeat BMP in the morning.  Continue to encourage oral intake. Recent Labs  Lab 07/11/21 0856 07/12/21 0639 07/13/21 0509 07/14/21 0656 07/15/21 0200  BUN 31* 28* 17 17 20   CREATININE 1.76* 1.61* 1.33* 1.49* 1.55*    Loose stool: No abdominal pain or leukocytosis or fever.  Could be in the setting of her COVID-19 infection. Seems to have improved with 1 dose of Imodium.   Hypercalcemia: Resolved suspect due to contraction/dehydration  Hypoglycemia resolved at this time.  off D5 normal saline for now.  On normal saline. Recent Labs  Lab 07/14/21 0634 07/14/21 1154 07/14/21 1734 07/14/21 2241 07/15/21 0632  GLUCAP 122* 92 87 82 91     COVID-19 positive: Patient had generalized weakness. CRP mildly elevated.  Not hypoxic so no need for steroids.  Completed course of remdesivir.  Pneumonia likely bacterial:Blood cultures negative in 3 days.  Chest x-ray shows right infra hilar airspace disease.  Procalcitonin was elevated at 1.8.  managed with IV antibiotics Zosyn and Zithromax.-In addition to Augmentin end date- 07/17/21 SLP  has seen. And tolerating po  Iron deficiency anemia. Hemoglobin stable.  Continue iron supplementation  Recent Labs  Lab 07/11/21 0856 07/12/21 0639 07/13/21 0509 07/14/21 0656 07/15/21 0200  HGB 7.7* 7.9* 7.9* 8.8*  8.3*  HCT 24.9* 24.9* 25.3* 27.3* 26.4*    Hypertension:controlled.HCTZ and ARB on hold due to AKI and will not resume on D/C. Continue hydralazine prn for now.   Hypothyroidism: Continue Synthroid   Chronic left upper extremity  spastic weakness/Debility/deconditioning; Physical therapy Occupational Therapy on board and recommended skilled nursing facility placement on discharge.  Patient unable to take care of herself   DVT prophylaxis: heparin injection 5,000 Units Start: 07/07/21 2200 Code Status:    Code Status: Full Code   Family Communication: No one at bedside. Status is: Inpatient Patient remains inpatient due to need for IV medications, IV antibiotic, rehabilitation placement Dispo: The patient is from: SNF              Anticipated d/c is to: SNF when insurance is authorized and AKI improves tomorrow.              Patient currently is not medically stable to d/c.   Difficult to place patient No  Objective:  Today's Vitals   07/14/21 2255 07/15/21 0000 07/15/21 0450 07/15/21 1018  BP: (!) 131/96  139/90 (!) 140/100  Pulse: (!) 101  92 90  Resp: 18  18 17   Temp: 97.9 F (36.6 C)  98.3 F (36.8 C) 98 F (36.7 C)  TempSrc: Oral  Oral Oral  SpO2: 99%  100% 100%  Weight:      Height:      PainSc:  0-No pain     Physical examination: General exam: AAOx3, weak frail, older than stated age, weak appearing. HEENT:Oral mucosa dry , Ear/Nose WNL grossly, dentition normal. Respiratory system: bilaterally clear breath sounds, no use of accessory muscle Cardiovascular system: S1 & S2 +, No JVD,. Gastrointestinal system: Abdomen soft, NT,ND, BS+ Nervous System:Alert, awake, moving extremities and grossly nonfocal Extremities: no edema, distal peripheral pulses palpable.  Skin: No rashes,no icterus.decreased skin turgor  MSK: Normal muscle bulk,tone, power   Intake/Output Summary (Last 24 hours) at 07/15/2021 1118 Last data filed at 07/15/2021 1020 Gross per 24 hour  Intake 669.31 ml  Output 300 ml  Net 369.31 ml    Filed Weights   07/12/21 2123 07/13/21 0637 07/14/21 0500  Weight: 52.9 kg 52.9 kg 53.1 kg   Weight change:     Consultants: None  Procedures: None  Antimicrobials:  Anti-infectives (From admission, onward)    Start     Dose/Rate Route Frequency Ordered Stop   07/14/21 1200  amoxicillin-clavulanate (AUGMENTIN) 500-125 MG per tablet 500 mg        1 tablet Oral Every 12 hours 07/14/21 1114 07/17/21 0959   07/10/21 1045  piperacillin-tazobactam (ZOSYN) IVPB 3.375 g  Status:  Discontinued        3.375 g 12.5 mL/hr over 240 Minutes Intravenous Every 8 hours 07/10/21 0954 07/14/21 1114   07/10/21 1000  azithromycin (ZITHROMAX) tablet 500 mg        500 mg Oral Daily 07/10/21 0842 07/12/21 1114   07/09/21 1000  remdesivir 100 mg in sodium chloride 0.9 % 100 mL IVPB       See Hyperspace for full Linked Orders Report.   100 mg 200 mL/hr over 30 Minutes Intravenous Daily 07/08/21 1040 07/10/21 1014   07/08/21 1130  remdesivir 200 mg in sodium chloride 0.9% 250 mL IVPB       See Hyperspace for full Linked Orders Report.   200 mg 580 mL/hr over 30 Minutes Intravenous Once 07/08/21 1040 07/08/21 1415      Culture/Microbiology  Component Value Date/Time   SDES BLOOD SITE NOT SPECIFIED 07/10/2021 0303   SDES BLOOD SITE NOT SPECIFIED 07/10/2021 0303   SPECREQUEST  07/10/2021 0303    AEROBIC BOTTLE ONLY Blood Culture results may not be optimal due to an inadequate volume of blood received in culture bottles   SPECREQUEST  07/10/2021 0303    AEROBIC BOTTLE ONLY Blood Culture results may not be optimal due to an inadequate volume of blood received in culture bottles   CULT  07/10/2021 0303    NO GROWTH 5 DAYS Performed at [facility], [place] [address], [place], [place] [address]    CULT  07/10/2021 0303    NO GROWTH 5 DAYS Performed at [facility] [address], [place], [place] [address]    REPTSTATUS 07/15/2021 FINAL 07/10/2021 0303   REPTSTATUS 07/15/2021 FINAL 07/10/2021 0303    Other culture-see note  Unresulted Labs (From admission, onward)     Start     Ordered    07/09/21 2330  Urinalysis, Complete w Microscopic Urine, Clean Catch  Once,   R        07/09/21 2329          medications reviewed:  Scheduled Meds:  amoxicillin-clavulanate  1 tablet Oral Q12H   vitamin C  500 mg Oral Daily   famotidine  20 mg Oral Daily   feeding supplement  237 mL Oral BID BM   ferrous sulfate  325 mg Oral Q supper   heparin  5,000 Units Subcutaneous Q12H   levothyroxine  88 mcg Oral Q0600   loratadine  10 mg Oral Daily   multivitamin with minerals  1 tablet Oral Daily   rosuvastatin  20 mg Oral Daily   thiamine  100 mg Oral Daily   topiramate  50 mg Oral BID   traZODone  25-50 mg Oral QHS   zinc sulfate  220 mg Oral Daily   Continuous Infusions:  sodium chloride 100 mL/hr at 07/15/21 1036     Intake/Output from previous day: 09/29 0701 - 09/30 0700 In: 929.3 [P.O.:740; I.V.:189.3] Out: -  Intake/Output this shift: Total I/O In: 100 [P.O.:100] Out: 300 [Urine:300] Filed Weights   07/12/21 2123 07/13/21 0637 07/14/21 0500  Weight: 52.9 kg 52.9 kg 53.1 kg   Data Reviewed: I have personally reviewed the following labs and imaging studies.    CBC: Recent Labs  Lab 07/11/21 0856 07/12/21 0639 07/13/21 0509 07/14/21 0656 07/15/21 0200  WBC 3.6* 4.1 4.4 4.3 6.2  HGB 7.7* 7.9* 7.9* 8.8* 8.3*  HCT 24.9* 24.9* 25.3* 27.3* 26.4*  MCV 93.3 90.9 92.3 90.7 91.7  PLT 151 112* 167 188 798    Basic Metabolic Panel: Recent Labs  Lab 07/09/21 0402 07/10/21 0303 07/11/21 0856 07/12/21 0639 07/13/21 0509 07/14/21 0656 07/15/21 0200  NA 138   < > 140 140 140 140 140  K 4.1   < > 3.7 3.9 3.6 3.8 3.7  CL 107   < > 114* 116* 117* 114* 114*  CO2 20*   < > 19* 19* 18* 20* 20*  GLUCOSE 66*   < > 100* 97 116* 132* 128*  BUN 26*   < > 31* 28* 17 17 20   CREATININE 1.64*   < > 1.76* 1.61* 1.33* 1.49* 1.55*  CALCIUM 10.3   < > 9.2 9.2 9.1 9.9 9.7  MG 1.9  --   --   --   --  2.1  --    < > =  values in this interval not displayed.    GFR: Estimated  Creatinine Clearance: 27.1 mL/min (A) (by C-G formula based on SCr of 1.55 mg/dL (H)). Liver Function Tests: Recent Labs  Lab 07/09/21 0402 07/10/21 0303  AST 27 26  ALT 17 16  ALKPHOS 76 62  BILITOT 0.6 0.5  PROT 6.5 5.5*  ALBUMIN 3.2* 2.8*    No results for input(s): LIPASE, AMYLASE in the last 168 hours. Recent Labs  Lab 07/09/21 0756  AMMONIA 15    Coagulation Profile: No results for input(s): INR, PROTIME in the last 168 hours. Cardiac Enzymes: No results for input(s): CKTOTAL, CKMB, CKMBINDEX, TROPONINI in the last 168 hours. BNP (last 3 results) No results for input(s): PROBNP in the last 8760 hours. HbA1C: No results for input(s): HGBA1C in the last 72 hours. CBG: Recent Labs  Lab 07/14/21 0634 07/14/21 1154 07/14/21 1734 07/14/21 2241 07/15/21 0632  GLUCAP 122* 92 87 82 91    Lipid Profile: No results for input(s): CHOL, HDL, LDLCALC, TRIG, CHOLHDL, LDLDIRECT in the last 72 hours. Thyroid Function Tests: No results for input(s): TSH, T4TOTAL, FREET4, T3FREE, THYROIDAB in the last 72 hours.  Anemia Panel: No results for input(s): VITAMINB12, FOLATE, FERRITIN, TIBC, IRON, RETICCTPCT in the last 72 hours.  Sepsis Labs: Recent Labs  Lab 07/10/21 0303  PROCALCITON 1.80  LATICACIDVEN 1.0     Recent Results (from the past 240 hour(s))  Resp Panel by RT-PCR (Flu A&B, Covid) Nasopharyngeal Swab     Status: Abnormal   Collection Time: 07/07/21  5:23 PM   Specimen: Nasopharyngeal Swab; Nasopharyngeal(NP) swabs in vial transport medium  Result Value Ref Range Status   SARS Coronavirus 2 by RT PCR POSITIVE (A) NEGATIVE Final    Comment: RESULT CALLED TO, READ BACK BY AND VERIFIED WITH: MELVIN, L.,RN 07/07/2021 2035 BY PAW. (NOTE) SARS-CoV-2 target nucleic acids are DETECTED.  The SARS-CoV-2 RNA is generally detectable in upper respiratory specimens during the acute phase of infection. Positive results are indicative of the presence of the identified  virus, but do not rule out bacterial infection or co-infection with other pathogens not detected by the test. Clinical correlation with patient history and other diagnostic information is necessary to determine patient infection status. The expected result is Negative.  Fact Sheet for Patients: EntrepreneurPulse.com.au  Fact Sheet for Healthcare Providers: IncredibleEmployment.be  This test is not yet approved or cleared by the [place] FDA and  has been authorized for detection and/or diagnosis of SARS-CoV-2 by FDA under an Emergency Use Authorization (EUA).  This EUA will remain in effect (meaning this test ca n be used) for the duration of  the COVID-19 declaration under Section 564(b)(1) of the Act, 21 U.S.C. section 360bbb-3(b)(1), unless the authorization is terminated or revoked sooner.     Influenza A by PCR NEGATIVE NEGATIVE Final   Influenza B by PCR NEGATIVE NEGATIVE Final    Comment: (NOTE) The Xpert Xpress SARS-CoV-2/FLU/RSV plus assay is intended as an aid in the diagnosis of influenza from Nasopharyngeal swab specimens and should not be used as a sole basis for treatment. Nasal washings and aspirates are unacceptable for Xpert Xpress SARS-CoV-2/FLU/RSV testing.  Fact Sheet for Patients: EntrepreneurPulse.com.au  Fact Sheet for Healthcare Providers: IncredibleEmployment.be  This test is not yet approved or cleared by the [place] FDA and has been authorized for detection and/or diagnosis of SARS-CoV-2 by FDA under an Emergency Use Authorization (EUA). This EUA will remain in effect (meaning this test can be used)  for the duration of the COVID-19 declaration under Section 564(b)(1) of the Act, 21 U.S.C. section 360bbb-3(b)(1), unless the authorization is terminated or revoked.  Performed at [facility], [place] [address] [place], [place] [address]   MRSA Next Gen by PCR,  Nasal     Status: None   Collection Time: 07/09/21  1:39 AM   Specimen: Nasal Mucosa; Nasal Swab  Result Value Ref Range Status   MRSA by PCR Next Gen NOT DETECTED NOT DETECTED Final    Comment: (NOTE) The GeneXpert MRSA Assay (FDA approved for NASAL specimens only), is one component of a comprehensive MRSA colonization surveillance program. It is not intended to diagnose MRSA infection nor to guide or monitor treatment for MRSA infections. Test performance is not FDA approved in patients less than 58 years old. Performed at [facility], [place] [address] [place], [place] [address]   Culture, blood (routine x 2)     Status: None   Collection Time: 07/10/21  3:03 AM   Specimen: BLOOD  Result Value Ref Range Status   Specimen Description BLOOD SITE NOT SPECIFIED  Final   Special Requests   Final    AEROBIC BOTTLE ONLY Blood Culture results may not be optimal due to an inadequate volume of blood received in culture bottles   Culture   Final    NO GROWTH 5 DAYS Performed at [facility], [place] [address], [place], [place] [address]    Report Status 07/15/2021 FINAL  Final  Culture, blood (routine x 2)     Status: None   Collection Time: 07/10/21  3:03 AM   Specimen: BLOOD  Result Value Ref Range Status   Specimen Description BLOOD SITE NOT SPECIFIED  Final   Special Requests   Final    AEROBIC BOTTLE ONLY Blood Culture results may not be optimal due to an inadequate volume of blood received in culture bottles   Culture   Final    NO GROWTH 5 DAYS Performed at [facility], [place] [address], [place], [place] [address]    Report Status 07/15/2021 FINAL  Final      Radiology Studies: No results found.   LOS: 7 days   Antonieta Pert, MD [facility] 07/15/2021, 11:18 AM

## 2021-07-15 NOTE — Progress Notes (Signed)
Nutrition Follow-up  DOCUMENTATION CODES:  Severe malnutrition in context of chronic illness  INTERVENTION:  -Continue regular diet -Continue Ensure Plus High Protein po BID, each supplement provides 350 kcal and 20 grams of protein. -Continue Magic cup TID with meals, each supplement provides 290 kcal and 9 grams of protein. -Continue MVI with minerals daily.  If PO intake does not improve, recommend placement of Cortrak/NGT for initiation of nutrition support to help pt meet nutritional needs. Consider: Osmolite 1.2 @ 36ml/hr w/ 20ml Prosource TF daily to provide 1912 kcals and 97 grams protein.   NUTRITION DIAGNOSIS:  Severe Malnutrition related to chronic illness as evidenced by severe fat depletion, severe muscle depletion, percent weight loss. -- ongoing  GOAL:  Patient will meet greater than or equal to 90% of their needs -- progressing   MONITOR:  PO intake, Supplement acceptance, Diet advancement, Labs, Weight trends, I & O's  REASON FOR ASSESSMENT:  Consult Calorie Count  ASSESSMENT:  70 yo female with a PMH of T2DM, CKD stage 2, HTN, hypothyroidism, idiopathic left upper extremity spasm hyperplasia, chronic urinary incontinence, and recent UTI came in for increasing confusion. Patient fell, but unsure if she remained conscious or not. Admitted with AKI and AMS. Found to be COVID+ on 9/23.  Acute metabolic encephalopathy resolved and pt awaiting placement though is noted to appear weak. Pt has not been eating well and has had multiple episodes of loose stool which RN reports has improved with administration on Imodium yesterday. Per CM/SW, pt may be able to d/c to SNF Monday pending bed availability and insurance authorization.   Per weight history, pt weighed 64.8 kg on 01/06/21 and now weighs 53.1 kg, indicating a clinically significant 18% wt loss over 6 months.   PO Intake: 0-50% x last 8 meal completions (15% average meal intake).   If PO intake does not improve,  recommend placement of Cortrak/NGT to supplement intake to help pt meet nutritional needs.   Medications: abx, vitamin C, pepcid, Ensure Enlive/Plus BID, ferrous sulfate, mvi with minerals, thiamine, zinc sulfate Labs: Cr 1.55 (H)  Admission weight: 51.1 kg Current weight: 53.1 kg   UOP: 326ml documented x24 hours I/O: +5L since admit  NUTRITION - FOCUSED PHYSICAL EXAM: Flowsheet Row Most Recent Value  Orbital Region Moderate depletion  Upper Arm Region Moderate depletion  Thoracic and Lumbar Region Severe depletion  Buccal Region Severe depletion  Temple Region Moderate depletion  Clavicle Bone Region Severe depletion  Clavicle and Acromion Bone Region Severe depletion  Scapular Bone Region Severe depletion  Dorsal Hand Moderate depletion  Patellar Region Severe depletion  Anterior Thigh Region Severe depletion  Posterior Calf Region Severe depletion  Edema (RD Assessment) None  Hair Reviewed  Eyes Reviewed  Mouth Reviewed  Skin Reviewed  Nails Reviewed      Diet Order:   Diet Order             Diet regular Room service appropriate? Yes; Fluid consistency: Thin  Diet effective now                  EDUCATION NEEDS:  No education needs have been identified at this time  Skin:  Skin Assessment: Reviewed RN Assessment  Last BM:  9/28  Height:  Ht Readings from Last 1 Encounters:  07/12/21 5\' 2"  (1.575 m)   Weight:  Wt Readings from Last 1 Encounters:  07/14/21 53.1 kg   BMI:  Body mass index is 21.41 kg/m.  Estimated Nutritional Needs:  Kcal:  1850-2050 Protein:  90-100 grams Fluid:  >1.85 L   Larkin Ina, MS, RD, LDN (she/her/hers) RD pager number and weekend/on-call pager number located in [place].

## 2021-07-16 DIAGNOSIS — D508 Other iron deficiency anemias: Secondary | ICD-10-CM | POA: Diagnosis not present

## 2021-07-16 DIAGNOSIS — U071 COVID-19: Secondary | ICD-10-CM | POA: Diagnosis not present

## 2021-07-16 DIAGNOSIS — N179 Acute kidney failure, unspecified: Secondary | ICD-10-CM | POA: Diagnosis not present

## 2021-07-16 DIAGNOSIS — E43 Unspecified severe protein-calorie malnutrition: Secondary | ICD-10-CM | POA: Insufficient documentation

## 2021-07-16 LAB — BASIC METABOLIC PANEL
Anion gap: 8 (ref 5–15)
BUN: 15 mg/dL (ref 8–23)
CO2: 17 mmol/L — ABNORMAL LOW (ref 22–32)
Calcium: 9.6 mg/dL (ref 8.9–10.3)
Chloride: 113 mmol/L — ABNORMAL HIGH (ref 98–111)
Creatinine, Ser: 1.29 mg/dL — ABNORMAL HIGH (ref 0.44–1.00)
GFR, Estimated: 45 mL/min — ABNORMAL LOW (ref >60)
Glucose, Bld: 86 mg/dL (ref 70–99)
Potassium: 3.8 mmol/L (ref 3.5–5.1)
Sodium: 138 mmol/L (ref 135–145)

## 2021-07-16 LAB — CBC
HCT: 26 % — ABNORMAL LOW (ref 36.0–46.0)
Hemoglobin: 8.4 g/dL — ABNORMAL LOW (ref 12.0–15.0)
MCH: 29.4 pg (ref 26.0–34.0)
MCHC: 32.3 g/dL (ref 30.0–36.0)
MCV: 90.9 fL (ref 80.0–100.0)
Platelets: 241 10*3/uL (ref 150–400)
RBC: 2.86 MIL/uL — ABNORMAL LOW (ref 3.87–5.11)
RDW: 14.2 % (ref 11.5–15.5)
WBC: 6.3 10*3/uL (ref 4.0–10.5)
nRBC: 0 % (ref 0.0–0.2)

## 2021-07-16 LAB — GLUCOSE, CAPILLARY
Glucose-Capillary: 85 mg/dL (ref 70–99)
Glucose-Capillary: 92 mg/dL (ref 70–99)
Glucose-Capillary: 94 mg/dL (ref 70–99)
Glucose-Capillary: 99 mg/dL (ref 70–99)

## 2021-07-16 NOTE — Progress Notes (Addendum)
PROGRESS NOTE  Joanna [name] MVH:[id number] DOB: Jul 17, 1951 DOA: 07/07/2021 PCP: [name], MD  HPI/Recap of past 24 hours:  Subjective : patient seen and examined at bedside37 year old female with past medical history significant for hypertension type 2 diabetes mellitus chronic kidney disease stage II hypothyroidism recent UTI who was sent from the nursing home with complaints of confusion and decreased output  Subjective : patient seen and examined at bedside denies any complaints   Assessment/Plan: Active Problems:   AKI (acute kidney injury) ([place])   Protein-calorie malnutrition, severe  #1 acute metabolic encephalopathy resolved Continue supportive care  2.  Fall near syncope lightheadedness Suspect due to dehydration 2D echo was done 2 months ago and showed ejection fraction of 60 to 60%.  Patient was given IV hydration and she will continue physical therapy  3.  AKI on chronic kidney disease  patient is back at baseline Patient had diarrhea which could have contributed to this AKI.  Continue IV hydration diarrhea has subsided  4.  Diarrhea.  She does not have any leukocytosis or abdominal pain.  Suspect this is due to COVID infection Diarrhea has improved with 1 Imodium  5.  Pneumonia likely bacterial Her procalcitonin was elevated initially. She was started on  Zosyn and Zithromax.  She will complete her azithromycin tomorrow July 17, 2021  6.  Mild hypertension: Patient was on hydrochlorothiazide and ARB but was held due to AKI Continue as needed hydralazine Will discontinue azithromycin and ARB on discharge  7.  Iron deficiency anemia also may be due to chronic kidney disease Continue iron supplements  8.  Hypothyroidism.  Continue Synthroid   Code Status: full   Severity of Illness: The appropriate patient status for this patient is INPATIENT. Inpatient status is judged to be reasonable and necessary in order to provide the required intensity of  service to ensure the patient's safety. The patient's presenting symptoms, physical exam findings, and initial radiographic and laboratory data in the context of their chronic comorbidities is felt to place them at high risk for further clinical deterioration. Furthermore, it is not anticipated that the patient will be medically stable for discharge from the hospital within 2 midnights of admission. The following factors support the patient status of inpatient.   " Dehydration on iv fluids  covid  * I certify that at the point of admission it is my clinical judgment that the patient will require inpatient hospital care spanning beyond 2 midnights from the point of admission due to high intensity of service, high risk for further deterioration and high frequency of surveillance required.*   Family Communication: non  ar bedside   Disposition Plan: Back to nursing home Status is: Inpatient   Dispo: The patient is from: SNF              Anticipated d/c is to:               Anticipated d/c date is:               Patient currently not medically stable for discharge  Consultants: 1  Procedures: [place]  Antimicrobials: Zosyn Zithromax  DVT prophylaxis: Subacute heparin   Objective: Vitals:   07/15/21 1018 07/15/21 1755 07/15/21 2106 07/16/21 0533  BP: (!) 140/100 135/85 132/80 (!) 148/101  Pulse: 90 100 98 92  Resp: 17 17 18 18   Temp: 98 F (36.7 C) 98.4 F (36.9 C) 98.4 F (36.9 C) 99.6 F (37.6 C)  TempSrc: Oral Oral  Oral Oral  SpO2: 100% 100% 100% 99%  Weight:      Height:        Intake/Output Summary (Last 24 hours) at 07/16/2021 1101 Last data filed at 07/16/2021 0600 Gross per 24 hour  Intake 2017.4 ml  Output --  Net 2017.4 ml   Filed Weights   07/12/21 2123 07/13/21 0637 07/14/21 0500  Weight: 52.9 kg 52.9 kg 53.1 kg   Body mass index is 21.41 kg/m.  Exam:  General: 70 y.o. year-old female well developed well nourished in no acute distress.  Alert and  oriented x3. Cardiovascular: Regular rate and rhythm with no rubs or gallops.  No thyromegaly or JVD noted.   Respiratory: Clear to auscultation with no wheezes or rales. Good inspiratory effort. Abdomen: Soft nontender nondistended with normal bowel sounds x4 quadrants. Musculoskeletal: No lower extremity edema. 2/4 pulses in all 4 extremities. Skin: No ulcerative lesions noted or rashes, Psychiatry: Mood is appropriate for condition and setting Neurology:    Data Reviewed: CBC: Recent Labs  Lab 07/12/21 0639 07/13/21 0509 07/14/21 0656 07/15/21 0200 07/16/21 0350  WBC 4.1 4.4 4.3 6.2 6.3  HGB 7.9* 7.9* 8.8* 8.3* 8.4*  HCT 24.9* 25.3* 27.3* 26.4* 26.0*  MCV 90.9 92.3 90.7 91.7 90.9  PLT 112* 167 188 216 505   Basic Metabolic Panel: Recent Labs  Lab 07/12/21 0639 07/13/21 0509 07/14/21 0656 07/15/21 0200 07/16/21 0350  NA 140 140 140 140 138  K 3.9 3.6 3.8 3.7 3.8  CL 116* 117* 114* 114* 113*  CO2 19* 18* 20* 20* 17*  GLUCOSE 97 116* 132* 128* 86  BUN 28* 17 17 20 15   CREATININE 1.61* 1.33* 1.49* 1.55* 1.29*  CALCIUM 9.2 9.1 9.9 9.7 9.6  MG  --   --  2.1  --   --    GFR: Estimated Creatinine Clearance: 32.6 mL/min (A) (by C-G formula based on SCr of 1.29 mg/dL (H)). Liver Function Tests: Recent Labs  Lab 07/10/21 0303  AST 26  ALT 16  ALKPHOS 62  BILITOT 0.5  PROT 5.5*  ALBUMIN 2.8*   No results for input(s): LIPASE, AMYLASE in the last 168 hours. No results for input(s): AMMONIA in the last 168 hours. Coagulation Profile: No results for input(s): INR, PROTIME in the last 168 hours. Cardiac Enzymes: No results for input(s): CKTOTAL, CKMB, CKMBINDEX, TROPONINI in the last 168 hours. BNP (last 3 results) No results for input(s): PROBNP in the last 8760 hours. HbA1C: No results for input(s): HGBA1C in the last 72 hours. CBG: Recent Labs  Lab 07/14/21 2241 07/15/21 0632 07/15/21 1754 07/16/21 0029 07/16/21 0522  GLUCAP 82 91 154* 85 92   Lipid  Profile: No results for input(s): CHOL, HDL, LDLCALC, TRIG, CHOLHDL, LDLDIRECT in the last 72 hours. Thyroid Function Tests: No results for input(s): TSH, T4TOTAL, FREET4, T3FREE, THYROIDAB in the last 72 hours. Anemia Panel: No results for input(s): VITAMINB12, FOLATE, FERRITIN, TIBC, IRON, RETICCTPCT in the last 72 hours. Urine analysis:    Component Value Date/Time   COLORURINE YELLOW 07/07/2021 1622   APPEARANCEUR CLEAR 07/07/2021 1622   LABSPEC 1.016 07/07/2021 1622   PHURINE 5.0 07/07/2021 1622   GLUCOSEU NEGATIVE 07/07/2021 1622   HGBUR NEGATIVE 07/07/2021 1622   BILIRUBINUR NEGATIVE 07/07/2021 1622   KETONESUR NEGATIVE 07/07/2021 1622   PROTEINUR NEGATIVE 07/07/2021 1622   NITRITE NEGATIVE 07/07/2021 1622   LEUKOCYTESUR NEGATIVE 07/07/2021 1622   Sepsis Labs: @LABRCNTIP (procalcitonin:4,lacticidven:4)  ) Recent Results (from the past 240 hour(s))  Resp Panel by RT-PCR (Flu A&B, Covid) Nasopharyngeal Swab     Status: Abnormal   Collection Time: 07/07/21  5:23 PM   Specimen: Nasopharyngeal Swab; Nasopharyngeal(NP) swabs in vial transport medium  Result Value Ref Range Status   SARS Coronavirus 2 by RT PCR POSITIVE (A) NEGATIVE Final    Comment: RESULT CALLED TO, READ BACK BY AND VERIFIED WITH: MELVIN, L.,RN 07/07/2021 2035 BY PAW. (NOTE) SARS-CoV-2 target nucleic acids are DETECTED.  The SARS-CoV-2 RNA is generally detectable in upper respiratory specimens during the acute phase of infection. Positive results are indicative of the presence of the identified virus, but do not rule out bacterial infection or co-infection with other pathogens not detected by the test. Clinical correlation with patient history and other diagnostic information is necessary to determine patient infection status. The expected result is Negative.  Fact Sheet for Patients: EntrepreneurPulse.com.au  Fact Sheet for Healthcare  Providers: IncredibleEmployment.be  This test is not yet approved or cleared by the [place] FDA and  has been authorized for detection and/or diagnosis of SARS-CoV-2 by FDA under an Emergency Use Authorization (EUA).  This EUA will remain in effect (meaning this test ca n be used) for the duration of  the COVID-19 declaration under Section 564(b)(1) of the Act, 21 U.S.C. section 360bbb-3(b)(1), unless the authorization is terminated or revoked sooner.     Influenza A by PCR NEGATIVE NEGATIVE Final   Influenza B by PCR NEGATIVE NEGATIVE Final    Comment: (NOTE) The Xpert Xpress SARS-CoV-2/FLU/RSV plus assay is intended as an aid in the diagnosis of influenza from Nasopharyngeal swab specimens and should not be used as a sole basis for treatment. Nasal washings and aspirates are unacceptable for Xpert Xpress SARS-CoV-2/FLU/RSV testing.  Fact Sheet for Patients: EntrepreneurPulse.com.au  Fact Sheet for Healthcare Providers: IncredibleEmployment.be  This test is not yet approved or cleared by the [place] FDA and has been authorized for detection and/or diagnosis of SARS-CoV-2 by FDA under an Emergency Use Authorization (EUA). This EUA will remain in effect (meaning this test can be used) for the duration of the COVID-19 declaration under Section 564(b)(1) of the Act, 21 U.S.C. section 360bbb-3(b)(1), unless the authorization is terminated or revoked.  Performed at [facility], [place] [address] [place], [place] [address]   MRSA Next Gen by PCR, Nasal     Status: None   Collection Time: 07/09/21  1:39 AM   Specimen: Nasal Mucosa; Nasal Swab  Result Value Ref Range Status   MRSA by PCR Next Gen NOT DETECTED NOT DETECTED Final    Comment: (NOTE) The GeneXpert MRSA Assay (FDA approved for NASAL specimens only), is one component of a comprehensive MRSA colonization surveillance program. It is not intended to  diagnose MRSA infection nor to guide or monitor treatment for MRSA infections. Test performance is not FDA approved in patients less than 73 years old. Performed at [facility], [place] [address], [place], [place] [address]   Culture, blood (routine x 2)     Status: None   Collection Time: 07/10/21  3:03 AM   Specimen: BLOOD  Result Value Ref Range Status   Specimen Description BLOOD SITE NOT SPECIFIED  Final   Special Requests   Final    AEROBIC BOTTLE ONLY Blood Culture results may not be optimal due to an inadequate volume of blood received in culture bottles   Culture   Final    NO GROWTH 5 DAYS Performed at [facility],  [address], [place], [place] [address]    Report Status 07/15/2021 FINAL  Final  Culture, blood (routine x 2)     Status: None   Collection Time: 07/10/21  3:03 AM   Specimen: BLOOD  Result Value Ref Range Status   Specimen Description BLOOD SITE NOT SPECIFIED  Final   Special Requests   Final    AEROBIC BOTTLE ONLY Blood Culture results may not be optimal due to an inadequate volume of blood received in culture bottles   Culture   Final    NO GROWTH 5 DAYS Performed at [facility], [place] [address], [place], [place] [address]    Report Status 07/15/2021 FINAL  Final      Studies: No results found.  Scheduled Meds:  amoxicillin-clavulanate  1 tablet Oral Q12H   vitamin C  500 mg Oral Daily   famotidine  20 mg Oral Daily   feeding supplement  237 mL Oral BID BM   ferrous sulfate  325 mg Oral Q supper   heparin  5,000 Units Subcutaneous Q12H   levothyroxine  88 mcg Oral Q0600   loratadine  10 mg Oral Daily   multivitamin with minerals  1 tablet Oral Daily   rosuvastatin  20 mg Oral Daily   thiamine  100 mg Oral Daily   topiramate  50 mg Oral BID   traZODone  25-50 mg Oral QHS   zinc sulfate  220 mg Oral Daily    Continuous Infusions:  sodium chloride Stopped (07/16/21 0600)     LOS: 8 days     Cristal Deer,  MD [facility]  To reach me or the doctor on call, go to: [email or web address] Password [email or web address]  07/16/2021, 11:01 AM

## 2021-07-17 DIAGNOSIS — U071 COVID-19: Secondary | ICD-10-CM | POA: Diagnosis not present

## 2021-07-17 DIAGNOSIS — D508 Other iron deficiency anemias: Secondary | ICD-10-CM | POA: Diagnosis not present

## 2021-07-17 DIAGNOSIS — N179 Acute kidney failure, unspecified: Secondary | ICD-10-CM | POA: Diagnosis not present

## 2021-07-17 LAB — CBC WITH DIFFERENTIAL/PLATELET
Abs Immature Granulocytes: 0.09 10*3/uL — ABNORMAL HIGH (ref 0.00–0.07)
Basophils Absolute: 0 10*3/uL (ref 0.0–0.1)
Basophils Relative: 1 %
Eosinophils Absolute: 0.2 10*3/uL (ref 0.0–0.5)
Eosinophils Relative: 3 %
HCT: 23.6 % — ABNORMAL LOW (ref 36.0–46.0)
Hemoglobin: 7.6 g/dL — ABNORMAL LOW (ref 12.0–15.0)
Immature Granulocytes: 2 %
Lymphocytes Relative: 30 %
Lymphs Abs: 1.7 10*3/uL (ref 0.7–4.0)
MCH: 29.5 pg (ref 26.0–34.0)
MCHC: 32.2 g/dL (ref 30.0–36.0)
MCV: 91.5 fL (ref 80.0–100.0)
Monocytes Absolute: 0.4 10*3/uL (ref 0.1–1.0)
Monocytes Relative: 7 %
Neutro Abs: 3.3 10*3/uL (ref 1.7–7.7)
Neutrophils Relative %: 57 %
Platelets: 215 10*3/uL (ref 150–400)
RBC: 2.58 MIL/uL — ABNORMAL LOW (ref 3.87–5.11)
RDW: 14.5 % (ref 11.5–15.5)
WBC: 5.7 10*3/uL (ref 4.0–10.5)
nRBC: 0 % (ref 0.0–0.2)

## 2021-07-17 LAB — BASIC METABOLIC PANEL
Anion gap: 8 (ref 5–15)
BUN: 15 mg/dL (ref 8–23)
CO2: 18 mmol/L — ABNORMAL LOW (ref 22–32)
Calcium: 9.8 mg/dL (ref 8.9–10.3)
Chloride: 113 mmol/L — ABNORMAL HIGH (ref 98–111)
Creatinine, Ser: 1.28 mg/dL — ABNORMAL HIGH (ref 0.44–1.00)
GFR, Estimated: 45 mL/min — ABNORMAL LOW (ref >60)
Glucose, Bld: 96 mg/dL (ref 70–99)
Potassium: 4.1 mmol/L (ref 3.5–5.1)
Sodium: 139 mmol/L (ref 135–145)

## 2021-07-17 LAB — GLUCOSE, CAPILLARY
Glucose-Capillary: 89 mg/dL (ref 70–99)
Glucose-Capillary: 122 mg/dL — ABNORMAL HIGH (ref 70–99)
Glucose-Capillary: 142 mg/dL — ABNORMAL HIGH (ref 70–99)

## 2021-07-17 NOTE — Progress Notes (Signed)
PROGRESS NOTE  Joanna [name] ZOX:[id number] DOB: 1951/06/03 DOA: 07/07/2021 PCP: [name], MD  HPI/Recap of past 41 hours:  70 year old female with past medical history significant for hypertension type 2 diabetes mellitus chronic kidney disease stage II hypothyroidism recent UTI who was sent from the nursing home with complaints of confusion and decreased output  Subjective : patient seen and examined at bedside denies any complaints  July 17, 2021 Patient seen and examined at bedside denies any new complaint even though she stated she is not worth more than $0.02.  She is eating well  Assessment/Plan: Active Problems:   AKI (acute kidney injury) ([place])   Protein-calorie malnutrition, severe  #1 acute metabolic encephalopathy resolved Continue supportive care  2.  Fall near syncope lightheadedness Suspect due to dehydration 2D echo was done 2 months ago and showed ejection fraction of 60 to 60%.  Patient was given IV hydration and she will continue physical therapy  3.  AKI on chronic kidney disease  patient is back at baseline Patient had diarrhea which could have contributed to this AKI.  Continue IV hydration diarrhea has subsided  4.  Diarrhea.  She does not have any leukocytosis or abdominal pain.  Suspect this is due to COVID infection Diarrhea has improved with 1 Imodium  5.  Pneumonia likely bacterial Her procalcitonin was elevated initially. She was started on  Zosyn and Zithromax.  She will complete her azithromycin tomorrow July 17, 2021  6.  Mild hypertension: Patient was on hydrochlorothiazide and ARB but was held due to AKI Continue as needed hydralazine Will discontinue azithromycin and ARB on discharge  7.  Iron deficiency anemia also may be due to chronic kidney disease Continue iron supplements  8.  Hypothyroidism.  Continue Synthroid   Code Status: full   Severity of Illness: The appropriate patient status for this patient is INPATIENT.  Inpatient status is judged to be reasonable and necessary in order to provide the required intensity of service to ensure the patient's safety. The patient's presenting symptoms, physical exam findings, and initial radiographic and laboratory data in the context of their chronic comorbidities is felt to place them at high risk for further clinical deterioration. Furthermore, it is not anticipated that the patient will be medically stable for discharge from the hospital within 2 midnights of admission. The following factors support the patient status of inpatient.   " Dehydration on iv fluids  covid  * I certify that at the point of admission it is my clinical judgment that the patient will require inpatient hospital care spanning beyond 2 midnights from the point of admission due to high intensity of service, high risk for further deterioration and high frequency of surveillance required.*   Family Communication: non  ar bedside   Disposition Plan: Back to nursing home Status is: Inpatient   Dispo: The patient is from: SNF              Anticipated d/c is to:               Anticipated d/c date is: SNF              Patient currently not medically stable for discharge Waiting for bed availability no words from [facility] yet  Consultants: 1  Procedures: Elvis Coil  Antimicrobials: Zosyn Zithromax  DVT prophylaxis: Subacute heparin   Objective: Vitals:   07/16/21 2334 07/17/21 0558 07/17/21 0947 07/17/21 1813  BP:  (!) 146/95 129/85 128/83  Pulse:  96  98 (!) 110  Resp:  17 16 18   Temp:  98.9 F (37.2 C) (!) 97.5 F (36.4 C) (!) 97.5 F (36.4 C)  TempSrc:   Oral Axillary  SpO2:  98% 100% 100%  Weight: 52.2 kg     Height:        Intake/Output Summary (Last 24 hours) at 07/17/2021 1846 Last data filed at 07/17/2021 1300 Gross per 24 hour  Intake 417 ml  Output 450 ml  Net -33 ml    Filed Weights   07/13/21 0637 07/14/21 0500 07/16/21 2334  Weight: 52.9 kg 53.1 kg 52.2 kg   Body  mass index is 21.05 kg/m.  Exam:  General: 70 y.o. year-old female well developed well nourished in no acute distress.  Looks older than stated age alert and oriented x3. Cardiovascular: Regular rate and rhythm with no rubs or gallops.  No thyromegaly or JVD noted.   Respiratory: Clear to auscultation with no wheezes or rales. Good inspiratory effort. Abdomen: Soft nontender nondistended with normal bowel sounds x4 quadrants. Musculoskeletal: No lower extremity edema. 2/4 pulses in all 4 extremities. Skin: No ulcerative lesions noted or rashes, Psychiatry: Mood is appropriate for condition and setting Neurology:    Data Reviewed: CBC: Recent Labs  Lab 07/13/21 0509 07/14/21 0656 07/15/21 0200 07/16/21 0350 07/17/21 0536  WBC 4.4 4.3 6.2 6.3 5.7  NEUTROABS  --   --   --   --  3.3  HGB 7.9* 8.8* 8.3* 8.4* 7.6*  HCT 25.3* 27.3* 26.4* 26.0* 23.6*  MCV 92.3 90.7 91.7 90.9 91.5  PLT 167 188 216 241 725    Basic Metabolic Panel: Recent Labs  Lab 07/13/21 0509 07/14/21 0656 07/15/21 0200 07/16/21 0350 07/17/21 0714  NA 140 140 140 138 139  K 3.6 3.8 3.7 3.8 4.1  CL 117* 114* 114* 113* 113*  CO2 18* 20* 20* 17* 18*  GLUCOSE 116* 132* 128* 86 96  BUN 17 17 20 15 15   CREATININE 1.33* 1.49* 1.55* 1.29* 1.28*  CALCIUM 9.1 9.9 9.7 9.6 9.8  MG  --  2.1  --   --   --     GFR: Estimated Creatinine Clearance: 32.8 mL/min (A) (by C-G formula based on SCr of 1.28 mg/dL (H)). Liver Function Tests: No results for input(s): AST, ALT, ALKPHOS, BILITOT, PROT, ALBUMIN in the last 168 hours.  No results for input(s): LIPASE, AMYLASE in the last 168 hours. No results for input(s): AMMONIA in the last 168 hours. Coagulation Profile: No results for input(s): INR, PROTIME in the last 168 hours. Cardiac Enzymes: No results for input(s): CKTOTAL, CKMB, CKMBINDEX, TROPONINI in the last 168 hours. BNP (last 3 results) No results for input(s): PROBNP in the last 8760 hours. HbA1C: No  results for input(s): HGBA1C in the last 72 hours. CBG: Recent Labs  Lab 07/16/21 1146 07/16/21 2331 07/17/21 0601 07/17/21 1155 07/17/21 1810  GLUCAP 99 94 89 122* 142*    Lipid Profile: No results for input(s): CHOL, HDL, LDLCALC, TRIG, CHOLHDL, LDLDIRECT in the last 72 hours. Thyroid Function Tests: No results for input(s): TSH, T4TOTAL, FREET4, T3FREE, THYROIDAB in the last 72 hours. Anemia Panel: No results for input(s): VITAMINB12, FOLATE, FERRITIN, TIBC, IRON, RETICCTPCT in the last 72 hours. Urine analysis:    Component Value Date/Time   COLORURINE YELLOW 07/07/2021 1622   APPEARANCEUR CLEAR 07/07/2021 1622   LABSPEC 1.016 07/07/2021 1622   PHURINE 5.0 07/07/2021 1622   GLUCOSEU NEGATIVE 07/07/2021 1622   HGBUR NEGATIVE  07/07/2021 [place] 07/07/2021 1622   KETONESUR NEGATIVE 07/07/2021 1622   PROTEINUR NEGATIVE 07/07/2021 1622   NITRITE NEGATIVE 07/07/2021 1622   LEUKOCYTESUR NEGATIVE 07/07/2021 1622   Sepsis Labs: @LABRCNTIP (procalcitonin:4,lacticidven:4)  ) Recent Results (from the past 240 hour(s))  MRSA Next Gen by PCR, Nasal     Status: None   Collection Time: 07/09/21  1:39 AM   Specimen: Nasal Mucosa; Nasal Swab  Result Value Ref Range Status   MRSA by PCR Next Gen NOT DETECTED NOT DETECTED Final    Comment: (NOTE) The GeneXpert MRSA Assay (FDA approved for NASAL specimens only), is one component of a comprehensive MRSA colonization surveillance program. It is not intended to diagnose MRSA infection nor to guide or monitor treatment for MRSA infections. Test performance is not FDA approved in patients less than 89 years old. Performed at [facility], [place] [address], [place], [place] [address]   Culture, blood (routine x 2)     Status: None   Collection Time: 07/10/21  3:03 AM   Specimen: BLOOD  Result Value Ref Range Status   Specimen Description BLOOD SITE NOT SPECIFIED  Final   Special Requests   Final    AEROBIC  BOTTLE ONLY Blood Culture results may not be optimal due to an inadequate volume of blood received in culture bottles   Culture   Final    NO GROWTH 5 DAYS Performed at [facility], [place] [address], [place], [place] [address]    Report Status 07/15/2021 FINAL  Final  Culture, blood (routine x 2)     Status: None   Collection Time: 07/10/21  3:03 AM   Specimen: BLOOD  Result Value Ref Range Status   Specimen Description BLOOD SITE NOT SPECIFIED  Final   Special Requests   Final    AEROBIC BOTTLE ONLY Blood Culture results may not be optimal due to an inadequate volume of blood received in culture bottles   Culture   Final    NO GROWTH 5 DAYS Performed at [facility], [place] [address], [place], [place] [address]    Report Status 07/15/2021 FINAL  Final      Studies: No results found.  Scheduled Meds:  vitamin C  500 mg Oral Daily   famotidine  20 mg Oral Daily   feeding supplement  237 mL Oral BID BM   ferrous sulfate  325 mg Oral Q supper   heparin  5,000 Units Subcutaneous Q12H   levothyroxine  88 mcg Oral Q0600   loratadine  10 mg Oral Daily   multivitamin with minerals  1 tablet Oral Daily   rosuvastatin  20 mg Oral Daily   thiamine  100 mg Oral Daily   topiramate  50 mg Oral BID   traZODone  25-50 mg Oral QHS   zinc sulfate  220 mg Oral Daily    Continuous Infusions:  sodium chloride 100 mL/hr at 07/17/21 0424     LOS: 9 days     Cristal Deer, MD [facility]  To reach me or the doctor on call, go to: [email or web address] Password [facility]  07/17/2021, 6:46 PM

## 2021-07-18 DIAGNOSIS — N179 Acute kidney failure, unspecified: Secondary | ICD-10-CM | POA: Diagnosis not present

## 2021-07-18 LAB — BASIC METABOLIC PANEL
Anion gap: 5 (ref 5–15)
BUN: 14 mg/dL (ref 8–23)
CO2: 20 mmol/L — ABNORMAL LOW (ref 22–32)
Calcium: 10 mg/dL (ref 8.9–10.3)
Chloride: 114 mmol/L — ABNORMAL HIGH (ref 98–111)
Creatinine, Ser: 1.28 mg/dL — ABNORMAL HIGH (ref 0.44–1.00)
GFR, Estimated: 45 mL/min — ABNORMAL LOW (ref >60)
Glucose, Bld: 108 mg/dL — ABNORMAL HIGH (ref 70–99)
Potassium: 4 mmol/L (ref 3.5–5.1)
Sodium: 139 mmol/L (ref 135–145)

## 2021-07-18 LAB — CBC
HCT: 28.2 % — ABNORMAL LOW (ref 36.0–46.0)
Hemoglobin: 8.9 g/dL — ABNORMAL LOW (ref 12.0–15.0)
MCH: 28.9 pg (ref 26.0–34.0)
MCHC: 31.6 g/dL (ref 30.0–36.0)
MCV: 91.6 fL (ref 80.0–100.0)
Platelets: 276 10*3/uL (ref 150–400)
RBC: 3.08 MIL/uL — ABNORMAL LOW (ref 3.87–5.11)
RDW: 14.4 % (ref 11.5–15.5)
WBC: 7.6 10*3/uL (ref 4.0–10.5)
nRBC: 0 % (ref 0.0–0.2)

## 2021-07-18 LAB — GLUCOSE, CAPILLARY
Glucose-Capillary: 99 mg/dL (ref 70–99)
Glucose-Capillary: 108 mg/dL — ABNORMAL HIGH (ref 70–99)
Glucose-Capillary: 100 mg/dL — ABNORMAL HIGH (ref 70–99)

## 2021-07-18 MED ORDER — ENSURE ENLIVE PO LIQD: 237.0000 mL | Freq: Two times a day (BID) | ORAL | 12 refills | Status: DC

## 2021-07-18 MED ORDER — THIAMINE HCL 100 MG PO TABS: 100.0000 mg | ORAL_TABLET | Freq: Every day | ORAL | Status: AC

## 2021-07-18 MED ORDER — ZINC SULFATE 220 (50 ZN) MG PO CAPS: 220.0000 mg | ORAL_CAPSULE | Freq: Every day | ORAL | Status: DC

## 2021-07-18 MED ORDER — ASCORBIC ACID 500 MG PO TABS: 500.0000 mg | ORAL_TABLET | Freq: Every day | ORAL | Status: DC

## 2021-07-18 NOTE — Discharge Summary (Addendum)
Physician Discharge Summary  Joanna [name] YDX:[id number] DOB: 07/24/1951 DOA: 07/07/2021  PCP: [name], MD  Admit date: 07/07/2021 Discharge date: 07/19/2021  Admitted From: snf Disposition:  snf  Recommendations for Outpatient Follow-up:  Follow up with PCP in 1-2 weeks Please obtain BMP/CBC in one week  Home Health:no  Equipment/Devices: none  Discharge Condition: Stable Code Status:   Code Status: Full Code Diet recommendation:  Diet Order             Diet regular Room service appropriate? Yes; Fluid consistency: Thin  Diet effective now                   Brief/Interim Summary: 70 year old female with past medical history of diabetes mellitus, CKD stage II, hypertension, hypothyroidism, idiopathic left upper extremity spasm hyperplasia, chronic urinary incontinence, recent UTI was sent from the skilled nursing facility with complaints of confusion decreased oral intake.  In the ED patient was noted to have elevated blood pressure and had mild acute kidney injury with creatinine of 2.2 from baseline of 1.1.  COVID-19 was positive.  CT head scan was negative for acute finding.  Chest x-ray was unremarkable.  Ultrasound of the kidneys showed hyperechoic kidneys.  Patient was then admitted to hospital with IV fluids.  She did have fever spike during hospitalization concerning for pneumonia so Zosyn and Zithromax was initiated and speech therapy was consulted.  Patient was then admitted hospital for further evaluation and treatment. She was treated for acute metabolic encephalopathy deconditioning in the setting of a COVID-19 infection but not hypoxic treated with remdesivir, IV fluid hydration due to AKI and CKD.  Creatinine has improved she had brief diarrhea that improved.  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  She will benefit with short-term rehab placement at this time but I suspect she will need long-term placement as she is not safe to return home and live by herself.  Discharge Diagnoses:  Acute metabolic encephalopathy Microvascular ischemic changes: suspect multifactorial, 2/2 deconditioning, COVID infection, pneumonia, and largely from her small vessel disease with multiple chronic microvascular ischemic events as evident on MRI showing "cerebral atrophy with moderately advanced chronic microvascular ischemic disease ". Had CT on admission.  At this time patient unable to go home and stay by herself and will need further rehabilitation PT OT may need long-term placement down the road. Cont asa, statins.  COVID-19 infection symptomatic with generalized weakness, not hypoxic.  Inflammatory markers were improving crp 9.7> 5.6  Right infrahilar airspace disease/pneumonia, bacterial with elevated procalcitonin patient completed antibiotics, seen by speech.   AKI on CKD stage II baseline creatinine around 1.1.Creatinine peaked to 1.7, now holding close to baseline 1.2 encourage oral intake.  Discontinue IV fluids, having net positive balance. Recent Labs (within last 365 days)              Recent Labs    07/09/21 0402 07/10/21 0303 07/11/21 0856 07/12/21 7209 07/13/21 0509 07/14/21 0656 07/15/21 0200 07/16/21 0350 07/17/21 0714 07/18/21 0116  BUN 26* 27* 31* 28* 17 17 20 15 15 14   CREATININE 1.64* 1.64* 1.76* 1.61* 1.33* 1.49* 1.55* 1.29* 1.28* 1.28*      Failure to thrive with poor oral intake in the setting of COVID-19 infection.  Continue PT OT, supportive  care nutrition supplement.   Hypertension blood pressure stable HCTZ ARB has been discontinued.  Blood pressure mostly stable if remains persistently elevated in 160s or above consider starting  low-dose amlodipine or beta-blocker   Chronic left upper extremity spastic weakness/debility: Continue PT OT   Fall/near syncope: In the setting of COVID-19 infection with dehydration echo with normal EF recently 2 months ago.  Continue PT OT   Protein-calorie malnutrition, severe: Augment nutritional status at supplement Diarrhea likely due to COVID-19.  Resolved Hypercalcemia from dehydration resolved Hypoglycemia-resolved, off fluids Iron deficiency anemia hemoglobin stable continue iron supplementation Hypothyroidism on Synthroid HLD on Crestor   Dispo: She cannot go back home alone and is not safe alone.Will benefit with short-term rehab but I suspect she will need long-term placement down the road.  Consults: TOC  Subjective: Alert awake communicative follows instruction. Discharge Exam: Vitals:   07/19/21 0550 07/19/21 1040  BP: 127/87 131/85  Pulse: (!) 108 (!) 101  Resp: 16 18  Temp: (!) 97.4 F (36.3 C) 98.7 F (37.1 C)  SpO2: 100% 100%   General: Pt is alert, awake, not in acute distress Cardiovascular: RRR, S1/S2 +, no rubs, no gallops Respiratory: CTA bilaterally, no wheezing, no rhonchi Abdominal: Soft, NT, ND, bowel sounds + Extremities: no edema, no cyanosis, left arm spastic, left-sided weakness  Discharge Instructions  Discharge Instructions     Discharge instructions   Complete by: As directed    Check CBC and BMP in 1 week  Please call call MD or return to ER for similar or worsening recurring problem that brought you to hospital or if any fever,nausea/vomiting,abdominal pain, uncontrolled pain, chest pain,  shortness of breath or any other alarming symptoms.  Please follow-up your doctor as instructed in a week time and call the office for appointment.  Please avoid alcohol, smoking, or any other illicit substance and maintain healthy habits including taking your regular medications as prescribed.  You were cared for by a hospitalist during  your hospital stay. If you have any questions about your discharge medications or the care you received while you were in the hospital after you are discharged, you can call the unit and ask to speak with the hospitalist on call if the hospitalist that took care of you is not available.  Once you are discharged, your primary care physician will handle any further medical issues. Please note that NO REFILLS for any discharge medications will be authorized once you are discharged, as it is imperative that you return to your primary care physician (or establish a relationship with a primary care physician if you do not have one) for your aftercare needs so that they can reassess your need for medications and monitor your lab values   Increase activity slowly   Complete by: As directed       Allergies as of 07/19/2021       Reactions   Zoloft [sertraline Hcl] Other (See Comments)   Made the patient feel withdrawn and had bad dreams- "Allergic," per [facility]   Ivp Dye [iodinated Diagnostic Agents] Rash, Other (See Comments)   "Allergic," per [facility]        Medication List     STOP taking these medications    indapamide 1.25 MG tablet Commonly known as: LOZOL   losartan 100 MG tablet Commonly known as: COZAAR   potassium chloride 10 MEQ tablet Commonly known as: KLOR-CON       TAKE these medications    acetaminophen 500 MG tablet  Commonly known as: TYLENOL Take 1,000 mg by mouth every 8 (eight) hours as needed for moderate pain.   albuterol 108 (90 Base) MCG/ACT inhaler Commonly known as: VENTOLIN HFA Inhale 2 puffs into the lungs every 4 (four) hours as needed for wheezing or shortness of breath.   ascorbic acid 500 MG tablet Commonly known as: VITAMIN C Take 1 tablet (500 mg total) by mouth daily.   aspirin 81 MG EC tablet Take 1 tablet (81 mg total) by mouth daily. Swallow whole.   diphenhydrAMINE 25 mg capsule Commonly known as: BENADRYL Take 1 capsule (25 mg total) by  mouth every 8 (eight) hours as needed for itching or allergies.   famotidine 20 MG tablet Commonly known as: PEPCID Take 1 tablet (20 mg total) by mouth 2 (two) times daily.   feeding supplement Liqd Take 237 mLs by mouth 2 (two) times daily between meals.   ferrous sulfate 325 (65 FE) MG tablet Take 325 mg by mouth daily with breakfast.   fexofenadine 180 MG tablet Commonly known as: ALLEGRA Take 180 mg by mouth daily as needed for allergies.   folic acid 1 MG tablet Commonly known as: FOLVITE TAKE 1 TABLET(1 MG) BY MOUTH DAILY   levothyroxine 88 MCG tablet Commonly known as: SYNTHROID Take 1 tablet (88 mcg total) by mouth daily before breakfast.   Magnesium 250 MG Tabs Take 1 tablet (250 mg total) by mouth 2 (two) times daily.   rosuvastatin 20 MG tablet Commonly known as: CRESTOR Take 1 tablet by mouth daily (Stop Pravastatin) What changed:  how much to take how to take this when to take this additional instructions   thiamine 100 MG tablet Take 1 tablet (100 mg total) by mouth daily.   topiramate 50 MG tablet Commonly known as: Topamax Take 1 tablet (50 mg total) by mouth 2 (two) times daily.   traZODone 50 MG tablet Commonly known as: DESYREL TAKE 1/2 TO 1 TABLET BY MOUTH EVERY NIGHT FOR SLEEP What changed:  how much to take how to take this when to take this additional instructions   vitamin B-12 500 MCG tablet Commonly known as: CYANOCOBALAMIN Take 1,000 mcg by mouth daily.   zinc sulfate 220 (50 Zn) MG capsule Take 1 capsule (220 mg total) by mouth daily.        Contact information for follow-up providers     [name], [name], MD Follow up in 1 week(s).   Specialty: Family Medicine Contact information: [place] [address] [phone]              Contact information for after-discharge care     Destination     [facility] Preferred SNF .   Service: Skilled Nursing Contact  information: [place] [phone]                    Allergies  Allergen Reactions   Zoloft [Sertraline Hcl] Other (See Comments)    Made the patient feel withdrawn and had bad dreams- "Allergic," per [facility]   Ivp Dye [Iodinated Diagnostic Agents] Rash and Other (See Comments)    "Allergic," per [facility]    The results of significant diagnostics from this hospitalization (including imaging, microbiology, ancillary and laboratory) are listed below for reference.    Microbiology: Recent Results (from the past 240 hour(s))  Culture, blood (routine x 2)     Status: None   Collection Time: 07/10/21  3:03  AM   Specimen: BLOOD  Result Value Ref Range Status   Specimen Description BLOOD SITE NOT SPECIFIED  Final   Special Requests   Final    AEROBIC BOTTLE ONLY Blood Culture results may not be optimal due to an inadequate volume of blood received in culture bottles   Culture   Final    NO GROWTH 5 DAYS Performed at [facility], [place] [address], [place], [place] [address]    Report Status 07/15/2021 FINAL  Final  Culture, blood (routine x 2)     Status: None   Collection Time: 07/10/21  3:03 AM   Specimen: BLOOD  Result Value Ref Range Status   Specimen Description BLOOD SITE NOT SPECIFIED  Final   Special Requests   Final    AEROBIC BOTTLE ONLY Blood Culture results may not be optimal due to an inadequate volume of blood received in culture bottles   Culture   Final    NO GROWTH 5 DAYS Performed at [facility], [place] [address] [place], [place] [address]    Report Status 07/15/2021 FINAL  Final    Procedures/Studies: DG Chest 2 View  Result Date: 06/24/2021 CLINICAL DATA:  Altered mental status EXAM: CHEST - 2 VIEW COMPARISON:  08/13/2017 FINDINGS: Lungs are well expanded, symmetric, and clear. No pneumothorax or pleural effusion. Cardiac size within normal limits. Pulmonary vascularity is normal. Osseous structures are  age-appropriate. No acute bone abnormality. IMPRESSION: No active cardiopulmonary disease. Electronically Signed   By: Fidela Salisbury M.D.   On: 06/24/2021 20:54   CT HEAD WO CONTRAST (5MM)  Result Date: 07/07/2021 CLINICAL DATA:  Altered mental status EXAM: CT HEAD WITHOUT CONTRAST TECHNIQUE: Contiguous axial images were obtained from the base of the skull through the vertex without intravenous contrast. COMPARISON:  Brain MRI 11/12/2019, CT head 06/25/2021 FINDINGS: Brain: There is no evidence of acute intracranial hemorrhage, extra-axial fluid collection, or acute infarct. Foci of hypodensity in the subcortical and periventricular white matter are again seen, likely reflecting sequela of chronic white matter microangiopathy. The ventricles are stable in size. There is no mass lesion. There is no midline shift. Vascular: There is calcification of the bilateral cavernous ICAs. Skull: Normal. Negative for fracture or focal lesion. Sinuses/Orbits: The imaged paranasal sinuses are clear. Bilateral lens implants are in place. The globes and orbits are otherwise unremarkable. Other: None. IMPRESSION: No acute intracranial pathology. Electronically Signed   By: Valetta Mole M.D.   On: 07/07/2021 16:29   CT HEAD WO CONTRAST (5MM)  Result Date: 06/25/2021 CLINICAL DATA:  Altered mental status. EXAM: CT HEAD WITHOUT CONTRAST TECHNIQUE: Contiguous axial images were obtained from the base of the skull through the vertex without intravenous contrast. COMPARISON:  Head CT dated 04/19/2021. FINDINGS: Brain: Mild age-related atrophy and chronic microvascular ischemic changes. There is no acute intracranial hemorrhage. No mass effect or midline shift. No extra-axial fluid collection. Vascular: No hyperdense vessel or unexpected calcification. Skull: Normal. Negative for fracture or focal lesion. Sinuses/Orbits: No acute finding. Other: There is apparent diffuse scalp edema. IMPRESSION: 1. No acute intracranial pathology.  2. Mild age-related atrophy and chronic microvascular ischemic changes. Electronically Signed   By: Anner Crete M.D.   On: 06/25/2021 00:52   MR BRAIN WO CONTRAST  Result Date: 07/08/2021 CLINICAL DATA:  Initial evaluation for delirium. EXAM: MRI HEAD WITHOUT CONTRAST TECHNIQUE: Multiplanar, multiecho pulse sequences of the brain and surrounding structures were obtained without intravenous contrast. COMPARISON:  Prior head CT from  07/07/2021. FINDINGS: Brain: Examination moderately degraded by motion artifact. Diffuse prominence of the CSF containing spaces compatible generalized cerebral atrophy. Patchy and confluent T2/FLAIR hyperintensity involving the periventricular and deep white matter both cerebral hemispheres consistent with chronic microvascular ischemic disease, moderately advanced in nature, and slightly more pronounced within the right cerebral hemispheres compared to the left. No abnormal foci of restricted diffusion to suggest acute or subacute ischemia. Gray-white matter differentiation maintained. No encephalomalacia to suggest chronic cortical infarction. No visible foci of susceptibility artifact to suggest acute or chronic intracranial hemorrhage. No mass lesion, midline shift or mass effect. No hydrocephalus or extra-axial fluid collection. Pituitary gland suprasellar region within normal limits. Midline structures intact. Vascular: Major intracranial vascular flow voids are maintained. Right vertebral artery somewhat dolichoectatic and partially invaginating upon the medulla. Skull and upper cervical spine: Craniocervical junction within normal limits. Postsurgical changes partially visualize within the upper cervical spine. Bone marrow signal intensity within normal limits. No scalp soft tissue abnormality. Sinuses/Orbits: Patient status post bilateral ocular lens replacement. Globes and orbital soft tissues demonstrate no acute finding. Paranasal sinuses are largely clear. No  significant mastoid effusion. Other: None. IMPRESSION: 1. No acute intracranial abnormality. 2. Generalized age-related cerebral atrophy with moderately advanced chronic microvascular ischemic disease. Electronically Signed   By: Jeannine Boga M.D.   On: 07/08/2021 23:18   US RENAL  Result Date: 07/07/2021 CLINICAL DATA:  Acute renal insufficiency EXAM: RENAL / URINARY TRACT ULTRASOUND COMPLETE COMPARISON:  04/05/2020 FINDINGS: Right Kidney: Renal measurements: 7.4 x 3.4 x 3.7 cm = volume: 51 mL. Increased renal echogenicity. No hydronephrosis. Left Kidney: Renal measurements: 6.8 x 3.5 x 3.4 cm = volume: 42.5 mL. Increased renal echogenicity. No hydronephrosis. Bladder: Partially decompressed. Other: None. IMPRESSION: No hydronephrosis. Small, hyperechoic kidneys, consistent with medical renal disease. Electronically Signed   By: Abigail Miyamoto M.D.   On: 07/07/2021 18:41   DG Chest Port 1 View  Result Date: 07/09/2021 CLINICAL DATA:  Systemic inflammatory response EXAM: PORTABLE CHEST 1 VIEW COMPARISON:  07/07/2021 FINDINGS: Interval development of right infrahilar airspace disease. No pleural effusion. Stable cardiomediastinal silhouette with aortic atherosclerosis. No pneumothorax. Hardware in the cervical spine IMPRESSION: Interval development of right infrahilar airspace disease, suspicious for pneumonia. Electronically Signed   By: Donavan Foil M.D.   On: 07/09/2021 23:52   DG Chest Portable 1 View  Result Date: 07/07/2021 CLINICAL DATA:  Altered mental status EXAM: PORTABLE CHEST 1 VIEW COMPARISON:  06/24/2021 FINDINGS: Cardiac shadow is mildly prominent but stable. Aortic calcifications are again seen and stable. Postsurgical changes are noted in the cervical spine. Lungs are clear bilaterally. Previously seen radiopaque density over the mid chest is no longer identified and likely extrinsic to the patient. IMPRESSION: No acute abnormality noted. Electronically Signed   By: Inez Catalina  M.D.   On: 07/07/2021 16:01    Labs: BNP (last 3 results) No results for input(s): BNP in the last 8760 hours. Basic Metabolic Panel: Recent Labs  Lab 07/14/21 0656 07/15/21 0200 07/16/21 0350 07/17/21 0714 07/18/21 0116  NA 140 140 138 139 139  K 3.8 3.7 3.8 4.1 4.0  CL 114* 114* 113* 113* 114*  CO2 20* 20* 17* 18* 20*  GLUCOSE 132* 128* 86 96 108*  BUN 17 20 15 15 14   CREATININE 1.49* 1.55* 1.29* 1.28* 1.28*  CALCIUM 9.9 9.7 9.6 9.8 10.0  MG 2.1  --   --   --   --    Liver Function Tests: No results for  input(s): AST, ALT, ALKPHOS, BILITOT, PROT, ALBUMIN in the last 168 hours. No results for input(s): LIPASE, AMYLASE in the last 168 hours. No results for input(s): AMMONIA in the last 168 hours. CBC: Recent Labs  Lab 07/14/21 0656 07/15/21 0200 07/16/21 0350 07/17/21 0536 07/18/21 0116  WBC 4.3 6.2 6.3 5.7 7.6  NEUTROABS  --   --   --  3.3  --   HGB 8.8* 8.3* 8.4* 7.6* 8.9*  HCT 27.3* 26.4* 26.0* 23.6* 28.2*  MCV 90.7 91.7 90.9 91.5 91.6  PLT 188 216 241 215 276   Cardiac Enzymes: No results for input(s): CKTOTAL, CKMB, CKMBINDEX, TROPONINI in the last 168 hours. BNP: Invalid input(s): POCBNP CBG: Recent Labs  Lab 07/18/21 1138 07/18/21 1633 07/19/21 0005 07/19/21 0631 07/19/21 1200  GLUCAP 108* 100* 158* 140* 128*   D-Dimer No results for input(s): DDIMER in the last 72 hours. Hgb A1c No results for input(s): HGBA1C in the last 72 hours. Lipid Profile No results for input(s): CHOL, HDL, LDLCALC, TRIG, CHOLHDL, LDLDIRECT in the last 72 hours. Thyroid function studies No results for input(s): TSH, T4TOTAL, T3FREE, THYROIDAB in the last 72 hours.  Invalid input(s): FREET3 Anemia work up No results for input(s): VITAMINB12, FOLATE, FERRITIN, TIBC, IRON, RETICCTPCT in the last 72 hours. Urinalysis    Component Value Date/Time   COLORURINE YELLOW 07/07/2021 1622   APPEARANCEUR CLEAR 07/07/2021 1622   LABSPEC 1.016 07/07/2021 1622   PHURINE 5.0  07/07/2021 1622   GLUCOSEU NEGATIVE 07/07/2021 1622   HGBUR NEGATIVE 07/07/2021 1622   BILIRUBINUR NEGATIVE 07/07/2021 1622   KETONESUR NEGATIVE 07/07/2021 1622   PROTEINUR NEGATIVE 07/07/2021 1622   NITRITE NEGATIVE 07/07/2021 1622   LEUKOCYTESUR NEGATIVE 07/07/2021 1622   Sepsis Labs Invalid input(s): PROCALCITONIN,  WBC,  LACTICIDVEN Microbiology Recent Results (from the past 240 hour(s))  Culture, blood (routine x 2)     Status: None   Collection Time: 07/10/21  3:03 AM   Specimen: BLOOD  Result Value Ref Range Status   Specimen Description BLOOD SITE NOT SPECIFIED  Final   Special Requests   Final    AEROBIC BOTTLE ONLY Blood Culture results may not be optimal due to an inadequate volume of blood received in culture bottles   Culture   Final    NO GROWTH 5 DAYS Performed at [facility], [place] [address] [place], [place] [address]    Report Status 07/15/2021 FINAL  Final  Culture, blood (routine x 2)     Status: None   Collection Time: 07/10/21  3:03 AM   Specimen: BLOOD  Result Value Ref Range Status   Specimen Description BLOOD SITE NOT SPECIFIED  Final   Special Requests   Final    AEROBIC BOTTLE ONLY Blood Culture results may not be optimal due to an inadequate volume of blood received in culture bottles   Culture   Final    NO GROWTH 5 DAYS Performed at [facility], [place] [address], [place], [place] [address]    Report Status 07/15/2021 FINAL  Final     Time coordinating discharge: 25 minutes  SIGNED: Antonieta Pert, MD  [facility] 07/19/2021, 12:45 PM  If 7PM-7AM, please contact night-coverage [email or web address]

## 2021-07-18 NOTE — TOC Progression Note (Signed)
Transition of Care [facility]) - Progression Note    Patient Details  Name: Joanna [name] MRN: [id number] Date of Birth: 08-14-1951  [facility] CM/SW Contact  Sharlet Salina Mila Homer, LCSW Phone Number: 07/18/2021, 5:21 PM  Clinical Narrative:  Patient medically stable for discharge and [facility] chosen. [employer] contacted this morning to initiate insurance auth. Followed up with [employer] (4:45 pm) and was informed that patient's information was forwarded to the medical director for review. Contact made with Narda Rutherford, admissions director at [facility] will continue to follow and will wait to hear from insurance regarding disposition.     Expected Discharge Plan: [place] Barriers to Discharge: Continued Medical Work up  Expected Discharge Plan and Services Expected Discharge Plan: [place] In-house Referral: Clinical Social Work   Post Acute Care Choice: [place] Living arrangements for the past 2 months: Apartment Expected Discharge Date: 07/18/21                         HH Arranged: NA [place] Agency: NA         Social Determinants of Health (SDOH) Interventions  No SDOH interventions requested or needed at this time.    Readmission Risk Interventions Readmission Risk Prevention Plan 06/27/2021  Transportation Screening Complete  HRI or [place] Complete  Social Work Consult for [place] Planning/Counseling Complete  Palliative Care Screening Not Applicable  Medication Review [employer]) Complete  Some recent data might be hidden

## 2021-07-18 NOTE — Progress Notes (Addendum)
PROGRESS NOTE    Joanna [name]  ACZ:[id number] DOB: 07-Jan-1951 DOA: 07/07/2021 PCP: [name], MD   Chief Complaint  Patient presents with   Altered Mental Status   Brief Narrative/Hospital Course: 70 year old female with past medical history of diabetes mellitus, CKD stage II, hypertension, hypothyroidism, idiopathic left upper extremity spasm hyperplasia, chronic urinary incontinence, recent UTI was sent from the skilled nursing facility with complaints of confusion decreased oral intake.  In the ED patient was noted to have elevated blood pressure and had mild acute kidney injury with creatinine of 2.2 from baseline of 1.1.  COVID-19 was positive.  CT head scan was negative for acute finding.  Chest x-ray was unremarkable.  Ultrasound of the kidneys showed hyperechoic kidneys.  Patient was then admitted to hospital with IV fluids.  She did have fever spike during hospitalization concerning for pneumonia so Zosyn and Zithromax was initiated and speech therapy was consulted.  Patient was then admitted hospital for further evaluation and treatment. She was treated for acute metabolic encephalopathy deconditioning in the setting of a COVID-19 infection but not hypoxic treated with remdesivir, IV fluid hydration due to AKI and CKD.  Creatinine has improved she had brief diarrhea that improved.  Remains weak frail deconditioned, at this time she is medically stabilized improved will need further PT OT and rehab placement and waiting on placement  Subjective: Seen this morning she is alert awake able to answer some questions but weak and deconditioned.  Assessment & Plan:  Acute metabolic encephalopathy: Mentation overall stable but at times mildly confused alert awake, very weak and deconditioned following commands.  CT and MRI of the head no acute finding.  Continue supportive care  COVID-19 infection symptomatic with generalized weakness, not hypoxic.  Inflammatory markers were  Right  infrahilar airspace disease/pneumonia, bacterial with elevated procalcitonin 1.8 patient completed antibiotics, seen by speech.  AKI on CKD stage II baseline creatinine around 1.1.Creatinine peaked to 1.7, now holding close to baseline 1.2 encourage oral intake.  Discontinue IV fluids, having net positive balance. Recent Labs    07/09/21 0402 07/10/21 0303 07/11/21 0856 07/12/21 1093 07/13/21 0509 07/14/21 0656 07/15/21 0200 07/16/21 0350 07/17/21 0714 07/18/21 0116  BUN 26* 27* 31* 28* 17 17 20 15 15 14   CREATININE 1.64* 1.64* 1.76* 1.61* 1.33* 1.49* 1.55* 1.29* 1.28* 1.28*    Failure to thrive with poor oral intake in the setting of COVID-19 infection.  Continue PT OT, supportive care nutrition supplement.  Hypertension blood pressure stable HCTZ ARB has been discontinued  Chronic left upper extremity spastic weakness/debility: Continue PT OT  Fall/near syncope: In the setting of COVID-19 infection with dehydration echo with normal EF recently 2 months ago.  Continue PT OT  Protein-calorie malnutrition, severe: Augment nutritional status at supplement Diarrhea likely due to COVID-19.  Resolved Hypercalcemia from dehydration resolved Hypoglycemia-resolved, off fluids Iron deficiency anemia hemoglobin stable continue iron supplementation Hypothyroidism on Synthroid HLD on Crestor  Diet Order             Diet regular Room service appropriate? Yes; Fluid consistency: Thin  Diet effective now                   Nutrition Problem: Severe Malnutrition Etiology: chronic illness Signs/Symptoms: severe fat depletion, severe muscle depletion, percent weight loss Percent weight loss: 18 % (over 6 months) Interventions: Ensure Enlive (each supplement provides 350kcal and 20 grams of protein), Magic cup, MVI, Liberalize Diet  DVT prophylaxis: heparin injection 5,000 Units Start:  07/07/21 2200 Code Status:   Code Status: Full Code  Family Communication: plan of care discussed  with patient at bedside. Status is: Inpatient Remains inpatient appropriate because:Unsafe d/c plan Dispo: The patient is from: SNF              Anticipated d/c is to: SNF              Patient currently is medically stable to d/c.   Difficult to place patient No  Objective: Vitals: Today's Vitals   07/17/21 1813 07/17/21 2309 07/17/21 2346 07/18/21 0616  BP: 128/83 (!) 149/91  (!) 161/93  Pulse: (!) 110 88  (!) 103  Resp: 18 18  18   Temp: (!) 97.5 F (36.4 C) 98.6 F (37 C)  98.8 F (37.1 C)  TempSrc: Axillary Oral  Oral  SpO2: 100% 98%  99%  Weight:      Height:      PainSc:   Asleep    Physical Examination: General exam: AA0X2-3, weak,older than stated age. HEENT:Oral mucosa moist, Ear/Nose WNL grossly,dentition normal. Respiratory system: B/l clear BS, no use of accessory muscle, non tender. Cardiovascular system: S1 & S2 +,No JVD. Gastrointestinal system: Abdomen soft, NT,ND, BS+. Nervous System:Alert, awake, weakness on left side more with spastic left upper extremity  Extremities: Leg edema NONE, distal peripheral pulses palpable.  Skin: No rashes, no icterus. MSK: Normal muscle bulk,tone, power.  Medications reviewed:  Scheduled Meds:  vitamin C  500 mg Oral Daily   famotidine  20 mg Oral Daily   feeding supplement  237 mL Oral BID BM   ferrous sulfate  325 mg Oral Q supper   heparin  5,000 Units Subcutaneous Q12H   levothyroxine  88 mcg Oral Q0600   loratadine  10 mg Oral Daily   multivitamin with minerals  1 tablet Oral Daily   rosuvastatin  20 mg Oral Daily   thiamine  100 mg Oral Daily   topiramate  50 mg Oral BID   traZODone  25-50 mg Oral QHS   zinc sulfate  220 mg Oral Daily   Continuous Infusions:  sodium chloride 100 mL/hr at 07/17/21 0424    Intake/Output  Intake/Output Summary (Last 24 hours) at 07/18/2021 1247 Last data filed at 07/18/2021 0800 Gross per 24 hour  Intake 2193.83 ml  Output 450 ml  Net 1743.83 ml   Intake/Output from  previous day: 10/02 0701 - 10/03 0700 In: 2193.8 [P.O.:477; I.V.:1716.8] Out: 450 [Urine:450] Net IO Since Admission: 9,211.02 mL [07/18/21 1247]   Weight change:   Wt Readings from Last 3 Encounters:  07/16/21 52.2 kg  06/24/21 55.2 kg  06/13/21 57 kg     Consultants:see note  Procedures:see note Antimicrobials: Anti-infectives (From admission, onward)    Start     Dose/Rate Route Frequency Ordered Stop   07/14/21 1200  amoxicillin-clavulanate (AUGMENTIN) 500-125 MG per tablet 500 mg        1 tablet Oral Every 12 hours 07/14/21 1114 07/17/21 0959   07/10/21 1045  piperacillin-tazobactam (ZOSYN) IVPB 3.375 g  Status:  Discontinued        3.375 g 12.5 mL/hr over 240 Minutes Intravenous Every 8 hours 07/10/21 0954 07/14/21 1114   07/10/21 1000  azithromycin (ZITHROMAX) tablet 500 mg        500 mg Oral Daily 07/10/21 0842 07/12/21 1114   07/09/21 1000  remdesivir 100 mg in sodium chloride 0.9 % 100 mL IVPB       See Hyperspace for full  Linked Orders Report.   100 mg 200 mL/hr over 30 Minutes Intravenous Daily 07/08/21 1040 07/10/21 1014   07/08/21 1130  remdesivir 200 mg in sodium chloride 0.9% 250 mL IVPB       See Hyperspace for full Linked Orders Report.   200 mg 580 mL/hr over 30 Minutes Intravenous Once 07/08/21 1040 07/08/21 1415      Culture/Microbiology    Component Value Date/Time   SDES BLOOD SITE NOT SPECIFIED 07/10/2021 0303   SDES BLOOD SITE NOT SPECIFIED 07/10/2021 0303   SPECREQUEST  07/10/2021 0303    AEROBIC BOTTLE ONLY Blood Culture results may not be optimal due to an inadequate volume of blood received in culture bottles   SPECREQUEST  07/10/2021 0303    AEROBIC BOTTLE ONLY Blood Culture results may not be optimal due to an inadequate volume of blood received in culture bottles   CULT  07/10/2021 0303    NO GROWTH 5 DAYS Performed at [facility], [place] [address], [place], [place] [address]    CULT  07/10/2021 0303    NO GROWTH 5  DAYS Performed at [facility] [address], [place], [place] [address]    REPTSTATUS 07/15/2021 FINAL 07/10/2021 0303   REPTSTATUS 07/15/2021 FINAL 07/10/2021 0303    Other culture-see note  Unresulted Labs (From admission, onward)     Start     Ordered   07/16/21 0500  CBC  Daily,   R     Question:  Specimen collection method  Answer:  Lab=Lab collect   07/15/21 1124          Data Reviewed: I have personally reviewed following labs and imaging studies CBC: Recent Labs  Lab 07/14/21 0656 07/15/21 0200 07/16/21 0350 07/17/21 0536 07/18/21 0116  WBC 4.3 6.2 6.3 5.7 7.6  NEUTROABS  --   --   --  3.3  --   HGB 8.8* 8.3* 8.4* 7.6* 8.9*  HCT 27.3* 26.4* 26.0* 23.6* 28.2*  MCV 90.7 91.7 90.9 91.5 91.6  PLT 188 216 241 215 269   Basic Metabolic Panel: Recent Labs  Lab 07/14/21 0656 07/15/21 0200 07/16/21 0350 07/17/21 0714 07/18/21 0116  NA 140 140 138 139 139  K 3.8 3.7 3.8 4.1 4.0  CL 114* 114* 113* 113* 114*  CO2 20* 20* 17* 18* 20*  GLUCOSE 132* 128* 86 96 108*  BUN 17 20 15 15 14   CREATININE 1.49* 1.55* 1.29* 1.28* 1.28*  CALCIUM 9.9 9.7 9.6 9.8 10.0  MG 2.1  --   --   --   --    GFR: Estimated Creatinine Clearance: 32.8 mL/min (A) (by C-G formula based on SCr of 1.28 mg/dL (H)). Liver Function Tests: No results for input(s): AST, ALT, ALKPHOS, BILITOT, PROT, ALBUMIN in the last 168 hours. No results for input(s): LIPASE, AMYLASE in the last 168 hours. No results for input(s): AMMONIA in the last 168 hours. Coagulation Profile: No results for input(s): INR, PROTIME in the last 168 hours. Cardiac Enzymes: No results for input(s): CKTOTAL, CKMB, CKMBINDEX, TROPONINI in the last 168 hours. BNP (last 3 results) No results for input(s): PROBNP in the last 8760 hours. HbA1C: No results for input(s): HGBA1C in the last 72 hours. CBG: Recent Labs  Lab 07/17/21 0601 07/17/21 1155 07/17/21 1810 07/18/21 0631 07/18/21 1138  GLUCAP 89 122* 142* 99  108*   Lipid Profile: No results for input(s): CHOL, HDL, LDLCALC, TRIG, CHOLHDL, LDLDIRECT in the last 72 hours. Thyroid  Function Tests: No results for input(s): TSH, T4TOTAL, FREET4, T3FREE, THYROIDAB in the last 72 hours. Anemia Panel: No results for input(s): VITAMINB12, FOLATE, FERRITIN, TIBC, IRON, RETICCTPCT in the last 72 hours. Sepsis Labs: No results for input(s): PROCALCITON, LATICACIDVEN in the last 168 hours.  Recent Results (from the past 240 hour(s))  MRSA Next Gen by PCR, Nasal     Status: None   Collection Time: 07/09/21  1:39 AM   Specimen: Nasal Mucosa; Nasal Swab  Result Value Ref Range Status   MRSA by PCR Next Gen NOT DETECTED NOT DETECTED Final    Comment: (NOTE) The GeneXpert MRSA Assay (FDA approved for NASAL specimens only), is one component of a comprehensive MRSA colonization surveillance program. It is not intended to diagnose MRSA infection nor to guide or monitor treatment for MRSA infections. Test performance is not FDA approved in patients less than 74 years old. Performed at [facility], [place] [address] [place], [place] [address]   Culture, blood (routine x 2)     Status: None   Collection Time: 07/10/21  3:03 AM   Specimen: BLOOD  Result Value Ref Range Status   Specimen Description BLOOD SITE NOT SPECIFIED  Final   Special Requests   Final    AEROBIC BOTTLE ONLY Blood Culture results may not be optimal due to an inadequate volume of blood received in culture bottles   Culture   Final    NO GROWTH 5 DAYS Performed at [facility], [place] [address] [place], [place] [address]    Report Status 07/15/2021 FINAL  Final  Culture, blood (routine x 2)     Status: None   Collection Time: 07/10/21  3:03 AM   Specimen: BLOOD  Result Value Ref Range Status   Specimen Description BLOOD SITE NOT SPECIFIED  Final   Special Requests   Final    AEROBIC BOTTLE ONLY Blood Culture results may not be optimal due to an inadequate volume of blood  received in culture bottles   Culture   Final    NO GROWTH 5 DAYS Performed at [facility], [place] [address], [place], [place] [address]    Report Status 07/15/2021 FINAL  Final     Radiology Studies: No results found.   LOS: 10 days   Antonieta Pert, MD [facility]  07/18/2021, 12:47 PM

## 2021-07-19 DIAGNOSIS — N179 Acute kidney failure, unspecified: Secondary | ICD-10-CM | POA: Diagnosis not present

## 2021-07-19 LAB — GLUCOSE, CAPILLARY
Glucose-Capillary: 128 mg/dL — ABNORMAL HIGH (ref 70–99)
Glucose-Capillary: 140 mg/dL — ABNORMAL HIGH (ref 70–99)
Glucose-Capillary: 158 mg/dL — ABNORMAL HIGH (ref 70–99)

## 2021-07-19 MED ORDER — ASPIRIN EC 81 MG PO TBEC
81.0000 mg | DELAYED_RELEASE_TABLET | Freq: Every day | ORAL | Status: DC
  Administered 2021-07-19 – 2021-07-25 (×7): 81 mg via ORAL
  Filled 2021-07-19 (×7): qty 1

## 2021-07-19 MED ORDER — ASPIRIN 81 MG PO TBEC: 81.0000 mg | DELAYED_RELEASE_TABLET | Freq: Every day | ORAL | Status: DC

## 2021-07-19 NOTE — Plan of Care (Signed)
  Problem: Clinical Measurements: Goal: Ability to maintain clinical measurements within normal limits will improve Outcome: Progressing Goal: Will remain free from infection Outcome: Progressing Goal: Respiratory complications will improve Outcome: Progressing   Problem: Nutrition: Goal: Adequate nutrition will be maintained Outcome: Progressing   Problem: Coping: Goal: Level of anxiety will decrease Outcome: Progressing

## 2021-07-19 NOTE — Progress Notes (Signed)
Occupational Therapy Treatment Patient Details Name: LANETRA HARTLEY MRN: [id number] DOB: January 10, 1951 Today's Date: 07/19/2021   History of present illness 70 y.o. female presents to [facility] on 07/07/2021 with increasing confusion and fall. Pt found to be COVID-19+ in ED. Pt admitted for AKI and acute metabolic encephalopathy. PMH includes IIDM, CKD stage II, HTN, hypothyroidism, idiopathic left upper extremity spasm hyperplasia,, chronic urinary incontinence, recent UTI.   OT comments  Patient seen to address bed mobility and sitting balance. Patient rolled side to side in bed with assistance to change gown and pad and perform toilet hygiene. Patient sat on eob requiring assistance for balance.  Trunk stretching and rom performed to improve balance with temporary results. Patient returned to supine.     Recommendations for follow up therapy are one component of a multi-disciplinary discharge planning process, led by the attending physician.  Recommendations may be updated based on patient status, additional functional criteria and insurance authorization.    Follow Up Recommendations  SNF    Equipment Recommendations  Wheelchair (measurements OT);Wheelchair cushion (measurements OT)    Recommendations for Other Services      Precautions / Restrictions Precautions Precautions: Fall Precaution Comments: LUE contracture       Mobility Bed Mobility Overal bed mobility: Needs Assistance Bed Mobility: Supine to Sit;Rolling;Sit to Supine Rolling: Total assist   Supine to sit: HOB elevated;Total assist Sit to supine: Total assist;+2 for physical assistance   General bed mobility comments: pt with very stiff LEs, LUE and torso and unable to isolate movements/components of rolling; no assist with RUE to come to sit    Transfers                 General transfer comment: not attempted based on pt's limited ability to participate    Balance Overall balance assessment: Needs  assistance Sitting-balance support: Single extremity supported;Feet supported Sitting balance-Leahy Scale: Poor Sitting balance - Comments: pt with leftward lean; sat EOB ~10 minutes working on rt lateral lean with prop on elbow (pt could maintain, but not able to assist with return to upright posture) and bil trunk rotation with brief periods of improved midline sitting that quickly were lost to her resuming left lateral lean Postural control: Right lateral lean                                 ADL either performed or assessed with clinical judgement   ADL Overall ADL's : Needs assistance/impaired                             Toileting- Clothing Manipulation and Hygiene: Maximal assistance;Bed level Toileting - Clothing Manipulation Details (indicate cue type and reason): assisted patient with toilet hygiene in bed       General ADL Comments: Patient had completed feeding with family     Vision       Perception     Praxis      Cognition Arousal/Alertness: Awake/alert Behavior During Therapy: Flat affect Overall Cognitive Status: No family/caregiver present to determine baseline cognitive functioning Area of Impairment: Following commands;Problem solving                   Current Attention Level: Focused   Following Commands: Follows one step commands with increased time;Follows one step commands inconsistently     Problem Solving: Slow processing;Decreased initiation;Requires verbal cues;Requires tactile cues  General Comments: Pt mostly non-verbal throughout session and therefore difficult to assess some aspects of cognition; very slow to respond to multi-modal cues        Exercises     Shoulder Instructions       General Comments      Pertinent Vitals/ Pain       Pain Assessment: Faces Faces Pain Scale: No hurt  Home Living                                          Prior Functioning/Environment               Frequency  Min 2X/week        Progress Toward Goals  OT Goals(current goals can now be found in the care plan section)  Progress towards OT goals: Progressing toward goals  Acute Rehab OT Goals Patient Stated Goal: did not state OT Goal Formulation: Patient unable to participate in goal setting Time For Goal Achievement: 07/23/21 Potential to Achieve Goals: Good ADL Goals Pt Will Perform Eating: with set-up;sitting Pt Will Perform Grooming: with set-up;sitting Pt Will Transfer to Toilet: with modified independence;ambulating;grab bars Pt Will Perform Toileting - Clothing Manipulation and hygiene: with modified independence;sit to/from stand;sitting/lateral leans Pt/caregiver will Perform Home Exercise Program: Increased strength;Right Upper extremity;Independently;With written HEP provided Additional ADL Goal #1: Patient will supine to sit with Min a of one to increase independence with toileting.  Plan Discharge plan remains appropriate    Co-evaluation      Reason for Co-Treatment: Complexity of the patient's impairments (multi-system involvement);For patient/therapist safety PT goals addressed during session: Mobility/safety with mobility;Balance;Strengthening/ROM OT goals addressed during session: Strengthening/ROM      AM-PAC OT "6 Clicks" Daily Activity     Outcome Measure   Help from another person eating meals?: A Lot Help from another person taking care of personal grooming?: A Lot Help from another person toileting, which includes using toliet, bedpan, or urinal?: Total Help from another person bathing (including washing, rinsing, drying)?: Total Help from another person to put on and taking off regular upper body clothing?: Total Help from another person to put on and taking off regular lower body clothing?: Total 6 Click Score: 8    End of Session    OT Visit Diagnosis: Muscle weakness (generalized) (M62.81);Unsteadiness on feet (R26.81);Other  symptoms and signs involving cognitive function;Feeding difficulties (R63.3)   Activity Tolerance     Patient Left in bed;with call bell/phone within reach;with bed alarm set   Nurse Communication Other (comment) (discussed patient participation with therapy)        Time: 1040-1103 OT Time Calculation (min): 23 min  Charges: OT General Charges $OT Visit: 1 Visit OT Treatments $Therapeutic Activity: 8-22 mins  Lodema Hong, [place] 07/19/2021, 12:01 PM

## 2021-07-19 NOTE — Progress Notes (Signed)
Physical Therapy Treatment Patient Details Name: Joanna [name] MRN: [id number] DOB: 1951-01-04 Today's Date: 07/19/2021   History of Present Illness 70 y.o. female presents to [facility] on 07/07/2021 with increasing confusion and fall. Pt found to be COVID-19+ in ED. Pt admitted for AKI and acute metabolic encephalopathy. PMH includes IIDM, CKD stage II, HTN, hypothyroidism, idiopathic left upper extremity spasm hyperplasia,, chronic urinary incontinence, recent UTI.    PT Comments    Patient with flat affect and very little attempts at verbal interaction. Opens eyes to name called and makes eye contact when therapy on her right side. Worked on bed mobility and sitting balance with little active participation by patient. Tolerated trunk stretching/ROM to try to improve sitting balance with only momentary improvements and returns to leftward lean.     Recommendations for follow up therapy are one component of a multi-disciplinary discharge planning process, led by the attending physician.  Recommendations may be updated based on patient status, additional functional criteria and insurance authorization.  Follow Up Recommendations  SNF     Equipment Recommendations  Wheelchair (measurements PT);Wheelchair cushion (measurements PT);Hospital bed    Recommendations for Other Services       Precautions / Restrictions Precautions Precautions: Fall Precaution Comments: LUE contracture     Mobility  Bed Mobility Overal bed mobility: Needs Assistance Bed Mobility: Supine to Sit;Rolling;Sit to Supine Rolling: Total assist   Supine to sit: HOB elevated;Total assist Sit to supine: Total assist;+2 for physical assistance   General bed mobility comments: pt with very stiff LEs, LUE and torso and unable to isolate movements/components of rolling; no assist with RUE to come to sit    Transfers                 General transfer comment: not attempted based on pt's limited ability to  participate  Ambulation/Gait                 Stairs             Wheelchair Mobility    Modified Rankin (Stroke Patients Only)       Balance Overall balance assessment: Needs assistance Sitting-balance support: Single extremity supported;Feet supported Sitting balance-Leahy Scale: Poor Sitting balance - Comments: pt with leftward lean; sat EOB ~10 minutes working on rt lateral lean with prop on elbow (pt could maintain, but not able to assist with return to upright posture) and bil trunk rotation with brief periods of improved midline sitting that quickly were lost to her resuming left lateral lean Postural control: Right lateral lean                                  Cognition Arousal/Alertness: Awake/alert Behavior During Therapy: Flat affect Overall Cognitive Status: No family/caregiver present to determine baseline cognitive functioning Area of Impairment: Following commands;Problem solving                   Current Attention Level: Focused   Following Commands: Follows one step commands with increased time;Follows one step commands inconsistently     Problem Solving: Slow processing;Decreased initiation;Requires verbal cues;Requires tactile cues General Comments: Pt mostly non-verbal throughout session and therefore difficult to assess some aspects of cognition; very slow to respond to multi-modal cues      Exercises      General Comments        Pertinent Vitals/Pain Pain Assessment: Faces Faces Pain Scale: No  hurt    Home Living                      Prior Function            PT Goals (current goals can now be found in the care plan section) Acute Rehab PT Goals Patient Stated Goal: did not state PT Goal Formulation: With family Time For Goal Achievement: 07/22/21 Potential to Achieve Goals: Fair Progress towards PT goals: Not progressing toward goals - comment (less participatory this date)     Frequency    Min 2X/week      PT Plan Current plan remains appropriate    Co-evaluation PT/OT/SLP Co-Evaluation/Treatment: Yes Reason for Co-Treatment: Complexity of the patient's impairments (multi-system involvement);For patient/therapist safety PT goals addressed during session: Mobility/safety with mobility;Balance;Strengthening/ROM        AM-PAC PT "6 Clicks" Mobility   Outcome Measure  Help needed turning from your back to your side while in a flat bed without using bedrails?: Total Help needed moving from lying on your back to sitting on the side of a flat bed without using bedrails?: Total Help needed moving to and from a bed to a chair (including a wheelchair)?: Total Help needed standing up from a chair using your arms (e.g., wheelchair or bedside chair)?: Total Help needed to walk in hospital room?: Total Help needed climbing 3-5 steps with a railing? : Total 6 Click Score: 6    End of Session   Activity Tolerance: Patient tolerated treatment well Patient left: with call bell/phone within reach;in bed;with bed alarm set Nurse Communication: Mobility status PT Visit Diagnosis: Other abnormalities of gait and mobility (R26.89);Muscle weakness (generalized) (M62.81);History of falling (Z91.81);Other symptoms and signs involving the nervous system (R29.898)     Time: 1040-1103 PT Time Calculation (min) (ACUTE ONLY): 23 min  Charges:  $Therapeutic Activity: 8-22 mins                      Arby Barrette, PT Pager [phone]    Rexanne Mano 07/19/2021, 11:15 AM

## 2021-07-19 NOTE — Care Management Important Message (Signed)
Important Message  Patient Details  Name: Joanna [name] MRN: [id number] Date of Birth: Feb 26, 1951   Medicare Important Message Given:  Yes     Orbie Pyo 07/19/2021, 4:09 PM

## 2021-07-19 NOTE — Plan of Care (Signed)
  Problem: Clinical Measurements: Goal: Ability to maintain clinical measurements within normal limits will improve Outcome: Progressing Goal: Will remain free from infection Outcome: Progressing   

## 2021-07-19 NOTE — TOC Progression Note (Signed)
Transition of Care [facility]) - Progression Note    Patient Details  Name: Joanna [name] MRN: [id number] Date of Birth: 06-05-1951  Transition of Care [facility]) CM/SW Contact  Sharlet Salina Mila Homer, LCSW Phone Number: 07/19/2021, 12:57 PM  Clinical Narrative:  CSW contacted by [employer] and informed that after medical review, patient denied for SNF placement for ST rehab: Reason given, patient needs: "More custodial than rehab potential due to cognitive decline and pre-existing left upper extremity contracture".  The medical director Lynder Parents) is open to a Peer-to-Peer. His number is [phone], and the appeal must be done by 10 am on Wednesday  The ambulance request was approved and the approval number is [id number].   MD contacted, advised of denial and provided with peer-to-peer information.   10:50 am: Contacted daughter [name] [phone]) and informed her of insurance denial. Advised daughter that a discharge plan will have to be in place for her mother is insurance upholds the denial and this was discussed. Per daughter, no one is on her mother's bank account so she does not have access to her funds for [facility] bills or placement. Discussed her mother's current mobility status and provided her information from recent PT note. Ms. Redmond Pulling informed CSW that she need to talk with her family and will get back with CSW.   Expected Discharge Plan: [place] Barriers to Discharge: Continued Medical Work up  Expected Discharge Plan and Services Expected Discharge Plan: [place] In-house Referral: Clinical Social Work   Post Acute Care Choice: [place] Living arrangements for the past 2 months: Apartment Expected Discharge Date: 07/18/21                         HH Arranged: NA [place] Agency: NA         Social Determinants of Health (SDOH) Interventions    Readmission Risk Interventions Readmission Risk Prevention Plan  06/27/2021  Transportation Screening Complete  HRI or Home Care Consult Complete  Social Work Consult for [place] Planning/Counseling Complete  Palliative Care Screening Not Applicable  Medication Review Press [employer]) Complete  Some recent data might be hidden

## 2021-07-20 ENCOUNTER — Inpatient Hospital Stay (HOSPITAL_COMMUNITY)
Admit: 2021-07-20 | Discharge: 2021-07-20 | Disposition: A | Discharge status: Home / Self Care | Payer: PPO | Attending: Family Medicine | Admitting: Family Medicine

## 2021-07-20 DIAGNOSIS — N179 Acute kidney failure, unspecified: Secondary | ICD-10-CM | POA: Diagnosis not present

## 2021-07-20 DIAGNOSIS — U071 COVID-19: Secondary | ICD-10-CM | POA: Diagnosis not present

## 2021-07-20 DIAGNOSIS — R4182 Altered mental status, unspecified: Secondary | ICD-10-CM | POA: Diagnosis not present

## 2021-07-20 LAB — GLUCOSE, CAPILLARY
Glucose-Capillary: 126 mg/dL — ABNORMAL HIGH (ref 70–99)
Glucose-Capillary: 92 mg/dL (ref 70–99)
Glucose-Capillary: 94 mg/dL (ref 70–99)
Glucose-Capillary: 96 mg/dL (ref 70–99)

## 2021-07-20 NOTE — Progress Notes (Signed)
Rapid COVID test ordered for patient D/C. Patient tested positive for COVID on 07/07/21. Dr. Florene Glen made aware, order to obtain COVID test cancelled. Social work and patient made aware.

## 2021-07-20 NOTE — TOC Progression Note (Addendum)
Transition of Care [facility]) - Progression Note    Patient Details  Name: Joanna [name] MRN: [id number] Date of Birth: Feb 11, 1951  Transition of Care [facility]) CM/SW Contact  Sharlet Salina Mila Homer, LCSW Phone Number: 07/20/2021, 2:32 PM  Clinical Narrative:  Patient in need of SNF placement and came to hospital from [facility]. Talked with daughter [name] regarding placement and she requested that patient return to [place].   9/28: Narda Rutherford, admissions director at [facility] contacted regarding patient and her returning to their facility at discharge. Narda Rutherford also informed that patient tested COVID positive. CSW was informed by Narda Rutherford that patient was with them from 9/13 - 9/22 and did not test positive for COVID. They would not be able to accept her back until day 11 (10/2//22). Facility search was initiated and [facility] responded that could accept patient. CSW later learned that they would not be able to accept patient until 10/2. Talked with Janie on the 3rd, and they were able to accept patient. Daughter [name] [phone]) contacted and informed and was agreeable to her mother returning to [place].  10/4: CSW advised that insurance denied patient and MD can do Peer-to-Peer. Information provided to MD and peer-to-peer done. CSW advised at 11:08 am that patient approved for 7 days, effective 10/5. Facility authorization [id number] and ambulance auth number [id number].    10/5: CSW informed by MD that neurology will be consulted. CSW informed Narda Rutherford at [place] and was informed that if patient can't come today, she can't hold bed. CSW talked later with [facility] and she may be able to take patient on 10/6.     Expected Discharge Plan: Skilled Nursing Facility Barriers to Discharge: Barriers Resolved  Expected Discharge Plan and Services Expected Discharge Plan: [employer] In-house Referral: Clinical Social Work   Post Acute Care Choice: [place] Living arrangements for the past 2 months: Apartment Expected Discharge Date: 07/18/21                         HH Arranged: NA [place] Agency: NA         Social Determinants of Health (SDOH) Interventions  No SDOH interventions requested or needed at this time  Readmission Risk Interventions Readmission Risk Prevention Plan 06/27/2021  Transportation Screening Complete  HRI or [place] Complete  Social Work Consult for [place] Planning/Counseling Complete  Palliative Care Screening Not Applicable  Medication Review Press photographer) Complete  Some recent data might be hidden

## 2021-07-20 NOTE — Progress Notes (Signed)
EEG done at bedside on pleasantly confused patient. Sleep obtained. Results pending

## 2021-07-20 NOTE — Progress Notes (Signed)
PROGRESS NOTE    Joanna [name]  CVE:[id number] DOB: 11-23-1950 DOA: 07/07/2021 PCP: [name], MD   Chief Complaint  Patient presents with   Altered Mental Status    Brief Narrative:  70 year old female with past medical history of diabetes mellitus, CKD stage II, hypertension, hypothyroidism, idiopathic left upper extremity spasm hyperplasia, chronic urinary incontinence, recent UTI was sent from the skilled nursing facility with complaints of confusion decreased oral intake.  In the ED patient was noted to have elevated blood pressure and had mild acute kidney injury with creatinine of 2.2 from baseline of 1.1.  COVID-19 was positive.  CT head scan was negative for acute finding.  Chest x-ray was unremarkable.  Ultrasound of the kidneys showed hyperechoic kidneys.  Patient was then admitted to hospital with IV fluids.  She did have fever spike during hospitalization concerning for pneumonia so Zosyn and Zithromax was initiated and speech therapy was consulted.  Patient was then admitted hospital for further evaluation and treatment. She was treated for acute metabolic encephalopathy deconditioning in the setting of Joanna [name] COVID-19 infection but not hypoxic treated with remdesivir, IV fluid hydration due to AKI and CKD.  Creatinine has improved she had brief diarrhea that improved.  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  She will benefit with short-term rehab placement at this time but I suspect she will need long-term placement as she is not safe to return home and live by herself.   Assessment & Plan:   Active Problems:   AKI (acute kidney injury) ([place])   Protein-calorie malnutrition,  severe  Acute metabolic encephalopathy  Microvascular ischemic changes  Left Sided Weaknss: She's had Joanna [name] rapid decline over what sounds like the past month Living independently in July - sounds like she was close to her baseline until early Sept?  At baseline, walks without assistance and able to live independently.  Decline seems more impressive than might be expected related to deconditioning, covid, pneumonia, and chronic microvascular changes -> given abrupt decline, will ask neurology to comment LUE weakness is chronic, she's following outpatient for that LLE weakness noted today MRI brain without acute intracranial abnormality, age related cerebral atrophy, moderately advanced chronic microvascular ischemic disease TSH 9/22 wnl, B12 >7500 on 9/24.  RPR pending.  EEG with mild diffuse encephalopathy Appreciate neurology recommendations At this time patient unable to go home and stay by herself and will need further rehabilitation PT OT may need long-term placement down the road. Cont asa, statins.   COVID-19 infection symptomatic with generalized weakness, not hypoxic.  Inflammatory markers were improving crp 9.7> 5.6 She's completed remdesivir   Right infrahilar airspace disease/pneumonia, bacterial with elevated procalcitonin patient completed antibiotics, seen by speech.   AKI on CKD stage II baseline creatinine around 1.1.Creatinine peaked to 1.7, now holding close to baseline 1.2 encourage oral intake.  Discontinue IV fluids, having net positive balance.  Failure to thrive with poor oral intake in the setting of COVID-19 infection.  Continue PT OT, supportive care nutrition supplement.   Hypertension blood pressure stable HCTZ ARB has been discontinued.  Prn oral hydral ordered.  Follow BP.   Chronic left upper extremity spastic weakness/debility: Continue PT OT   Fall/near syncope: In the setting of COVID-19 infection with dehydration echo with normal EF recently 2  months ago.   Continue PT OT   Protein-calorie malnutrition, severe: Augment nutritional status at supplement  Diarrhea likely due to COVID-19.  Resolved  Hypercalcemia from dehydration resolved  Hypoglycemia-resolved, off fluids  Iron deficiency anemia hemoglobin stable continue iron supplementation  Hypothyroidism on Synthroid  HLD on Crestor  DVT prophylaxis: heparin Code Status: full  Family Communication: daughter over phone Disposition:   Status is: Inpatient  Remains inpatient appropriate because:Inpatient level of care appropriate due to severity of illness  Dispo: The patient is from: Home              Anticipated d/c is to: SNF              Patient currently is not medically stable to d/c.   Difficult to place patient No       Consultants:  neurology  Procedures: EEG IMPRESSION: This study is suggestive of mild diffuse encephalopathy, nonspecific etiology. No seizures or epileptiform discharges were seen throughout the recording.  Antimicrobials:  Anti-infectives (From admission, onward)    Start     Dose/Rate Route Frequency Ordered Stop   07/14/21 1200  amoxicillin-clavulanate (AUGMENTIN) 500-125 MG per tablet 500 mg        1 tablet Oral Every 12 hours 07/14/21 1114 07/17/21 0959   07/10/21 1045  piperacillin-tazobactam (ZOSYN) IVPB 3.375 g  Status:  Discontinued        3.375 g 12.5 mL/hr over 240 Minutes Intravenous Every 8 hours 07/10/21 0954 07/14/21 1114   07/10/21 1000  azithromycin (ZITHROMAX) tablet 500 mg        500 mg Oral Daily 07/10/21 0842 07/12/21 1114   07/09/21 1000  remdesivir 100 mg in sodium chloride 0.9 % 100 mL IVPB       See Hyperspace for full Linked Orders Report.   100 mg 200 mL/hr over 30 Minutes Intravenous Daily 07/08/21 1040 07/10/21 1014   07/08/21 1130  remdesivir 200 mg in sodium chloride 0.9% 250 mL IVPB       See Hyperspace for full Linked Orders Report.   200 mg 580 mL/hr over 30 Minutes Intravenous Once 07/08/21 1040  07/08/21 1415          Subjective: Doesn't say anything to me today  Objective: Vitals:   07/19/21 2132 07/20/21 0539 07/20/21 0955 07/20/21 1744  BP: (!) 140/95 125/80 (!) 137/99 (!) 123/92  Pulse: (!) 110 (!) 102 98 (!) 108  Resp: 18 17 18 18   Temp: 97.9 F (36.6 C) 98.6 F (37 C) (!) 97.4 F (36.3 C) 97.6 F (36.4 C)  TempSrc: Oral  Axillary   SpO2: 98% 100% 98% 100%  Weight:      Height:        Intake/Output Summary (Last 24 hours) at 07/20/2021 2053 Last data filed at 07/20/2021 1700 Gross per 24 hour  Intake 474 ml  Output --  Net 474 ml   Filed Weights   07/14/21 0500 07/16/21 2334 07/18/21 2052  Weight: 53.1 kg 52.2 kg 51.6 kg    Examination:  General exam: Appears calm and comfortable  Respiratory system: unlabored Cardiovascular system: RRR Gastrointestinal system: Abdomen is nondistended, soft and nontender. Central nervous system: does not consistently follow commands, withdraws right LE to irritative stimuli, moves R arm spontaneously.  L arm contracted without spontaneous movement, LLE without withdrawal from painful or irritative stimuli.  PERRL  Extremities: no LEE     Data Reviewed: I have personally reviewed following labs and imaging studies  CBC: Recent  Labs  Lab 07/14/21 0656 07/15/21 0200 07/16/21 0350 07/17/21 0536 07/18/21 0116  WBC 4.3 6.2 6.3 5.7 7.6  NEUTROABS  --   --   --  3.3  --   HGB 8.8* 8.3* 8.4* 7.6* 8.9*  HCT 27.3* 26.4* 26.0* 23.6* 28.2*  MCV 90.7 91.7 90.9 91.5 91.6  PLT 188 216 241 215 096    Basic Metabolic Panel: Recent Labs  Lab 07/14/21 0656 07/15/21 0200 07/16/21 0350 07/17/21 0714 07/18/21 0116  NA 140 140 138 139 139  K 3.8 3.7 3.8 4.1 4.0  CL 114* 114* 113* 113* 114*  CO2 20* 20* 17* 18* 20*  GLUCOSE 132* 128* 86 96 108*  BUN 17 20 15 15 14   CREATININE 1.49* 1.55* 1.29* 1.28* 1.28*  CALCIUM 9.9 9.7 9.6 9.8 10.0  MG 2.1  --   --   --   --     GFR: Estimated Creatinine Clearance: 32.8  mL/min (Greg Eckrich) (by C-G formula based on SCr of 1.28 mg/dL (H)).  Liver Function Tests: No results for input(s): AST, ALT, ALKPHOS, BILITOT, PROT, ALBUMIN in the last 168 hours.  CBG: Recent Labs  Lab 07/19/21 1200 08/14/2021 0058 08/14/21 0559 14-Aug-2021 1136 2021/08/14 1747  GLUCAP 128* 92 94 96 126*     No results found for this or any previous visit (from the past 240 hour(s)).       Radiology Studies: EEG adult  Result Date: 08/14/21 Joanna Havens, MD     08/14/21  4:57 PM Patient Name: TYNEKA SCAFIDI MRN: [id number] Epilepsy Attending: Lora [name] Referring Physician/Provider: Dr Fayrene Helper Date: 2021/08/14 Duration: 22.57 mins Patient history: 70yo F with ams. EEG to evaluate for seizure Level of alertness: Awake AEDs during EEG study: TPM Technical aspects: This EEG study was done with scalp electrodes positioned according to the 10-20 International system of electrode placement. Electrical activity was acquired at Joanna [name] sampling rate of 500Hz  and reviewed with Joanna [name] high frequency filter of 70Hz  and Joanna [name] low frequency filter of 1Hz . EEG data were recorded continuously and digitally stored. Description: The posterior dominant rhythm consists of 8Hz  activity of moderate voltage (25-35 uV) seen predominantly in posterior head regions, symmetric and reactive to eye opening and eye closing. EEG showed intermittent generalized 3 to 6 Hz theta-delta slowing. Physiologic photic driving was not seen during photic stimulation.  Hyperventilation was not performed.   ABNORMALITY - Intermittent slow, generalized IMPRESSION: This study is suggestive of mild diffuse encephalopathy, nonspecific etiology. No seizures or epileptiform discharges were seen throughout the recording. Joanna [name]        Scheduled Meds:  vitamin C  500 mg Oral Daily   aspirin EC  81 mg Oral Daily   famotidine  20 mg Oral Daily   feeding supplement  237 mL Oral BID BM   ferrous sulfate  325 mg Oral Q supper    heparin  5,000 Units Subcutaneous Q12H   levothyroxine  88 mcg Oral Q0600   loratadine  10 mg Oral Daily   multivitamin with minerals  1 tablet Oral Daily   rosuvastatin  20 mg Oral Daily   thiamine  100 mg Oral Daily   topiramate  50 mg Oral BID   traZODone  25-50 mg Oral QHS   zinc sulfate  220 mg Oral Daily   Continuous Infusions:   LOS: 12 days    Time spent: over 30 min    Fayrene Helper, MD [facility]   To contact  the attending provider between 7A-7P or the covering provider during after hours 7P-7A, please log into the web site [email or web address] and access using universal [place] password for that web site. If you do not have the password, please call the hospital operator.  07/20/2021, 8:53 PM

## 2021-07-20 NOTE — Consult Note (Signed)
NEURO HOSPITALIST CONSULT NOTE   Requestig physician: Dr. Florene Glen  Reason for Consult: Encephalopathy  History obtained from:   Chart     HPI:                                                                                                                                          Joanna [name] is an 70 y.o. female with a PMHx of DM, CKD stage II, hypertension, hypothyroidism, idiopathic left upper extremity spasm hyperplasia, chronic urinary incontinence, recent UTI who was brought over from her SNF and admitted to the hospital on 9/22 with complaints of confusion and decreased oral intake.  In the ED she was noted to have elevated BP and mild AKI with a creatinine of 2.2 from her baseline of 1.1.  Additionally, her COVID-19 test came back positive. She did have fever spike during hospitalization concerning for pneumonia so Zosyn and Zithromax was initiated. During her stay, she has been treated for acute metabolic encephalopathy and deconditioning in the setting of COVID-19 infection.  Creatinine has improved with conservative management including IVF.   She has remained weak, frail and deconditioned. She also continues to be confused. Hospitalist service suspects her encephalopathy/weakness to be multifactorial  secondary to deconditioning, COVID infection and pneumonia, in the setting of moderately advanced chronic microvascular disease with multiple chronic microvascular ischemic changes as evident on MRI which also showed cerebral atrophy. She has been deemed not safe to return home and live by herself.  Neurology has been consulted for further input regarding her encephalopathy.   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 [facility]   Bilateral headaches    Depression    Diabetes mellitus    Diverticulosis    Dyslipidemia    GERD (gastroesophageal reflux disease)    Hypertension    Insomnia     Microproteinuria    Renal insufficiency 2010   Right carpal tunnel syndrome 12/24/2017   Sleep apnea    Sleep apnea 12/25/2017   Abnormal sleep study February 2019 CPAP ordered   Thyroid disease    hypothyroid    Past Surgical History:  Procedure Laterality Date   ABDOMINAL HYSTERECTOMY  1984   BACK SURGERY     BILATERAL OOPHORECTOMY     BIOPSY  02/19/2020   Procedure: BIOPSY;  Surgeon: [name], MD;  [facility]: [facility];  Service: Endoscopy;;  gastric   CERVICAL DISC SURGERY  01/16/2019   COLONOSCOPY  01/29/12   Fields-2-3 hyperplastic polyps, mod internal hemorrhoids, diverticulosis throughout colon   COLONOSCOPY N/A 07/03/2014   Dr. Oneida Alar: Hyperplastic polyps   COLONOSCOPY N/A 06/06/2019   Dr. Oneida Alar: Diverticulosis, hemorrhoids, single polyp removed, pathology revealed lipoma.  Next colonoscopy 10 years  ESOPHAGOGASTRODUODENOSCOPY   01/29/2012   Fields-2-3cm sliding HH, fundic gland polyps, NEGATIVE H pylori   ESOPHAGOGASTRODUODENOSCOPY N/A 07/03/2014   Dr. Oneida Alar: gastritis and normal small bowel biopsies   ESOPHAGOGASTRODUODENOSCOPY N/A 02/19/2020   Fields: hypertrophic folds in the stomach. benign polyp removed   [facility] CAPSULE STUDY N/A 10/26/2014   SMALL BOWEL AVMs   KNEE ARTHROSCOPY Right    POLYPECTOMY  06/06/2019   Procedure: POLYPECTOMY;  Surgeon: [name], MD;  Location: [facility];  Service: Endoscopy;;   TOTAL ABDOMINAL HYSTERECTOMY W/ BILATERAL SALPINGOOPHORECTOMY      Family History  Problem Relation Age of Onset   Cardiomyopathy Father    Heart failure Mother    Hyperlipidemia Mother    Stroke Brother    Kidney cancer Brother    Diabetes Paternal Uncle    Heart disease Paternal Grandmother    Colon cancer Neg Hx    Colon polyps Neg Hx              Social History:  reports that she has been smoking cigarettes. She has a 7.50 pack-year smoking history. She quit smokeless tobacco use about 10 years ago. She reports current alcohol use. She  reports that she does not use drugs.  Allergies  Allergen Reactions   Zoloft [Sertraline Hcl] Other (See Comments)    Made the patient feel withdrawn and had bad dreams- "Allergic," per [facility]   Ivp Dye [Iodinated Diagnostic Agents] Rash and Other (See Comments)    "Allergic," per [facility]    MEDICATIONS:                                                                                                                     Scheduled:  vitamin C  500 mg Oral Daily   aspirin EC  81 mg Oral Daily   famotidine  20 mg Oral Daily   feeding supplement  237 mL Oral BID BM   ferrous sulfate  325 mg Oral Q supper   heparin  5,000 Units Subcutaneous Q12H   levothyroxine  88 mcg Oral Q0600   loratadine  10 mg Oral Daily   multivitamin with minerals  1 tablet Oral Daily   rosuvastatin  20 mg Oral Daily   thiamine  100 mg Oral Daily   topiramate  50 mg Oral BID   traZODone  25-50 mg Oral QHS   zinc sulfate  220 mg Oral Daily     ROS:  Unable to obtain a detailed ROS due to confusion.    Blood pressure (!) 123/92, pulse (!) 108, temperature 97.6 F (36.4 C), resp. rate 18, height 5\' 2"  (1.575 m), weight 51.6 kg, SpO2 100 %.   General Examination:                                                                                                       Physical Exam  HEENT-  [place]/AT   Lungs- Respirations unlabored Extremities- No edema   Neurological Examination Mental Status: Awake with decreased level of alertness. [place] with decreased spontaneous movement and speech. Will respond to questions with 1 word answers. Other than correctly identifying the city, she is not oriented to place, time or situation but able to state her name. Perseverates "[place]" to subsequent questions. Follows only simple commands, often requiring repetition by examiner to perform. Naming is  impaired. Flattened affect with hypomimia. Increased latencies of verbal and motor responses.  Cranial Nerves: II: Blinks to threat in right visual field briskly, less brisk blink to threat in left visual field. PERRL.   III,IV, VI: EOMI with saccadic visual pursuits noted. No nystagmus. Left palpebral fissure somewhat smaller than right.  V: Reacts to cool object on skin bilaterally VII: Does not smile to command.  VIII: Hearing intact to voice IX,X: Unable to assess XI: Head is midline XII: Does not protrude tongue to command.  Motor: RUE with normal bulk and tone. Resists examiner with 5/5 strength.  LUE with increased flexor tone at elbow, wrist and digits. Does not elevate antigravity to command. Will move LUE slightly when asked.  RLE 5/5 withdrawal to noxious. Not following lower extremity motor commands.  LLE with weak withdrawal to noxious.  Sensory: Reacts less briskly to pinch applied to LLE as compared to RLE.  Deep Tendon Reflexes: Left side with low amplitude hyperreflexia.  Plantars: Right: downgoing   Left: equivocal Cerebellar: No gross ataxia in the context of inability to follow complex commands for formal assessment of FNF and H-S.  Gait: Deferred   Lab Results: Basic Metabolic Panel: Recent Labs  Lab 07/14/21 0656 07/15/21 0200 07/16/21 0350 07/17/21 0714 07/18/21 0116  NA 140 140 138 139 139  K 3.8 3.7 3.8 4.1 4.0  CL 114* 114* 113* 113* 114*  CO2 20* 20* 17* 18* 20*  GLUCOSE 132* 128* 86 96 108*  BUN 17 20 15 15 14   CREATININE 1.49* 1.55* 1.29* 1.28* 1.28*  CALCIUM 9.9 9.7 9.6 9.8 10.0  MG 2.1  --   --   --   --     CBC: Recent Labs  Lab 07/14/21 0656 07/15/21 0200 07/16/21 0350 07/17/21 0536 07/18/21 0116  WBC 4.3 6.2 6.3 5.7 7.6  NEUTROABS  --   --   --  3.3  --   HGB 8.8* 8.3* 8.4* 7.6* 8.9*  HCT 27.3* 26.4* 26.0* 23.6* 28.2*  MCV 90.7 91.7 90.9 91.5 91.6  PLT 188 216 241 215 276    Cardiac Enzymes: No results for input(s): CKTOTAL,  CKMB, CKMBINDEX, TROPONINI in the  last 168 hours.  Lipid Panel: No results for input(s): CHOL, TRIG, HDL, CHOLHDL, VLDL, LDLCALC in the last 168 hours.  Imaging: EEG adult  Result Date: 07/20/2021 Lora Havens, MD     07/20/2021  4:57 PM Patient Name: JAHDAI PADOVANO MRN: [id number] Epilepsy Attending: Lora Havens Referring Physician/Provider: Dr Fayrene Helper Date: 07/20/2021 Duration: 22.57 mins Patient history: 70yo F with ams. EEG to evaluate for seizure Level of alertness: Awake AEDs during EEG study: TPM Technical aspects: This EEG study was done with scalp electrodes positioned according to the 10-20 International system of electrode placement. Electrical activity was acquired at a sampling rate of 500Hz  and reviewed with a high frequency filter of 70Hz  and a low frequency filter of 1Hz . EEG data were recorded continuously and digitally stored. Description: The posterior dominant rhythm consists of 8Hz  activity of moderate voltage (25-35 uV) seen predominantly in posterior head regions, symmetric and reactive to eye opening and eye closing. EEG showed intermittent generalized 3 to 6 Hz theta-delta slowing. Physiologic photic driving was not seen during photic stimulation.  Hyperventilation was not performed.   ABNORMALITY - Intermittent slow, generalized IMPRESSION: This study is suggestive of mild diffuse encephalopathy, nonspecific etiology. No seizures or epileptiform discharges were seen throughout the recording. Lora Havens     Assessment: 70 year old female with persistent confusion  1. Exam reveals cognitive impairment without agitation or waxing/waning level of consciousness. RN states that the patient's impaired cognition and disorientation have been stable during the latter part of her admission with the exception that she becomes somewhat more talkative at different times of the day.  2. EEG showed intermittent generalized slowing without epileptiform activity. The study is  suggestive of mild diffuse encephalopathy, nonspecific regarding etiology. 3. Her encephalopathy is most likely to be multifactorial  secondary to deconditioning and recent treatment of COVID infection and pneumonia, in the setting of moderately advanced chronic microvascular disease with multiple chronic microvascular ischemic changes as evident on MRI, which also showed cerebral atrophy.  Recommendations:  1. Maintain normal diurnal cycle. Lights on during day with blinds open and OOB to chair. Lights off at night with TV off.  2. May benefit from recreational therapy.  3. Avoid sedating meds, except for her trazodone PRN sleep.  4. Outpatient Neurology follow up for dementia evaluation.  5. Neurohospitalist service will sign off. Please call if there are additional questions.   Electronically signed: Dr. Kerney Elbe 07/20/2021, 7:37 PM

## 2021-07-20 NOTE — Procedures (Signed)
Patient Name: Joanna [name]  MRN: [id number]  Epilepsy Attending: Lora Havens  Referring Physician/Provider: Dr Fayrene Helper Date: 07/20/2021 Duration: 22.57 mins  Patient history: 70yo F with ams. EEG to evaluate for seizure  Level of alertness: Awake  AEDs during EEG study: TPM  Technical aspects: This EEG study was done with scalp electrodes positioned according to the 10-20 International system of electrode placement. Electrical activity was acquired at a sampling rate of 500Hz  and reviewed with a high frequency filter of 70Hz  and a low frequency filter of 1Hz . EEG data were recorded continuously and digitally stored.   Description: The posterior dominant rhythm consists of 8Hz  activity of moderate voltage (25-35 uV) seen predominantly in posterior head regions, symmetric and reactive to eye opening and eye closing. EEG showed intermittent generalized 3 to 6 Hz theta-delta slowing. Physiologic photic driving was not seen during photic stimulation.  Hyperventilation was not performed.     ABNORMALITY - Intermittent slow, generalized  IMPRESSION: This study is suggestive of mild diffuse encephalopathy, nonspecific etiology. No seizures or epileptiform discharges were seen throughout the recording.  Camri Molloy Barbra Sarks

## 2021-07-20 NOTE — Plan of Care (Signed)
  Problem: Clinical Measurements: Goal: Ability to maintain clinical measurements within normal limits will improve Outcome: Progressing Goal: Will remain free from infection Outcome: Progressing Goal: Diagnostic test results will improve Outcome: Progressing Goal: Respiratory complications will improve Outcome: Progressing Goal: Cardiovascular complication will be avoided Outcome: Progressing   Problem: Nutrition: Goal: Adequate nutrition will be maintained Outcome: Progressing   Problem: Coping: Goal: Level of anxiety will decrease Outcome: Progressing   Problem: Elimination: Goal: Will not experience complications related to bowel motility Outcome: Progressing Goal: Will not experience complications related to urinary retention Outcome: Progressing   Problem: Pain Managment: Goal: General experience of comfort will improve Outcome: Progressing   Problem: Safety: Goal: Ability to remain free from injury will improve Outcome: Progressing   Problem: Skin Integrity: Goal: Risk for impaired skin integrity will decrease Outcome: Progressing   Problem: Education: Goal: Knowledge of risk factors and measures for prevention of condition will improve Outcome: Progressing   Problem: Coping: Goal: Psychosocial and spiritual needs will be supported Outcome: Progressing   Problem: Respiratory: Goal: Will maintain a patent airway Outcome: Progressing Goal: Complications related to the disease process, condition or treatment will be avoided or minimized Outcome: Progressing

## 2021-07-21 ENCOUNTER — Inpatient Hospital Stay (HOSPITAL_COMMUNITY): Payer: PPO

## 2021-07-21 DIAGNOSIS — R4182 Altered mental status, unspecified: Secondary | ICD-10-CM | POA: Diagnosis not present

## 2021-07-21 LAB — COMPREHENSIVE METABOLIC PANEL
ALT: 62 U/L — ABNORMAL HIGH (ref 0–44)
AST: 68 U/L — ABNORMAL HIGH (ref 15–41)
Albumin: 2.7 g/dL — ABNORMAL LOW (ref 3.5–5.0)
Alkaline Phosphatase: 69 U/L (ref 38–126)
Anion gap: 10 (ref 5–15)
BUN: 29 mg/dL — ABNORMAL HIGH (ref 8–23)
CO2: 21 mmol/L — ABNORMAL LOW (ref 22–32)
Calcium: 10.2 mg/dL (ref 8.9–10.3)
Chloride: 109 mmol/L (ref 98–111)
Creatinine, Ser: 1.48 mg/dL — ABNORMAL HIGH (ref 0.44–1.00)
GFR, Estimated: 38 mL/min — ABNORMAL LOW (ref >60)
Glucose, Bld: 85 mg/dL (ref 70–99)
Potassium: 4.2 mmol/L (ref 3.5–5.1)
Sodium: 140 mmol/L (ref 135–145)
Total Bilirubin: 0.4 mg/dL (ref 0.3–1.2)
Total Protein: 6 g/dL — ABNORMAL LOW (ref 6.5–8.1)

## 2021-07-21 LAB — CBC WITH DIFFERENTIAL/PLATELET
Abs Immature Granulocytes: 0.04 10*3/uL (ref 0.00–0.07)
Basophils Absolute: 0 10*3/uL (ref 0.0–0.1)
Basophils Relative: 1 %
Eosinophils Absolute: 0.1 10*3/uL (ref 0.0–0.5)
Eosinophils Relative: 2 %
HCT: 27 % — ABNORMAL LOW (ref 36.0–46.0)
Hemoglobin: 8.4 g/dL — ABNORMAL LOW (ref 12.0–15.0)
Immature Granulocytes: 1 %
Lymphocytes Relative: 32 %
Lymphs Abs: 1.9 10*3/uL (ref 0.7–4.0)
MCH: 28.6 pg (ref 26.0–34.0)
MCHC: 31.1 g/dL (ref 30.0–36.0)
MCV: 91.8 fL (ref 80.0–100.0)
Monocytes Absolute: 0.4 10*3/uL (ref 0.1–1.0)
Monocytes Relative: 7 %
Neutro Abs: 3.4 10*3/uL (ref 1.7–7.7)
Neutrophils Relative %: 57 %
Platelets: 334 10*3/uL (ref 150–400)
RBC: 2.94 MIL/uL — ABNORMAL LOW (ref 3.87–5.11)
RDW: 14.5 % (ref 11.5–15.5)
WBC: 5.8 10*3/uL (ref 4.0–10.5)
nRBC: 0 % (ref 0.0–0.2)

## 2021-07-21 LAB — GLUCOSE, CAPILLARY
Glucose-Capillary: 95 mg/dL (ref 70–99)
Glucose-Capillary: 146 mg/dL — ABNORMAL HIGH (ref 70–99)

## 2021-07-21 LAB — PHOSPHORUS: Phosphorus: 3.5 mg/dL (ref 2.5–4.6)

## 2021-07-21 LAB — MAGNESIUM: Magnesium: 2.2 mg/dL (ref 1.7–2.4)

## 2021-07-21 LAB — RPR: RPR Ser Ql: NONREACTIVE

## 2021-07-21 MED ORDER — LORAZEPAM 2 MG/ML IJ SOLN
1.0000 mg | Freq: Once | INTRAMUSCULAR | Status: AC | PRN
  Administered 2021-07-21: 1 mg via INTRAVENOUS
  Filled 2021-07-21: qty 1

## 2021-07-21 NOTE — Progress Notes (Signed)
The patient refused to eat breakfast and dinner; She consumed 25% during lunch. Provided the patient with 1 bottle of Ensure between meals, and she drank 100% of both.

## 2021-07-21 NOTE — TOC Progression Note (Signed)
Transition of Care [facility]) - Progression Note    Patient Details  Name: Joanna [name] MRN: [id number] Date of Birth: 1951/05/01  Transition of [facility]) CM/SW Contact  Sharlet Salina Mila Homer, LCSW Phone Number: 07/21/2021, 1:40 PM  Clinical Narrative:   CSW informed by MD that patient not ready for discharge today. Per MD still has left sided weakness (not explained in neuro note), so he  asked them about further work-up ad they recommended additional MRI's.  Narda Rutherford, admissions director at [facility] contacted and updated.  CSW will f/u with Narda Rutherford once patient medically stable for discharge.    Expected Discharge Plan: Skilled Nursing Facility Barriers to Discharge: Barriers Resolved  Expected Discharge Plan and Services Expected Discharge Plan: [place] In-house Referral: Clinical Social Work   Post Acute Care Choice: [place] Living arrangements for the past 2 months: Apartment Expected Discharge Date: 07/18/21                         HH Arranged: NA [place] Agency: NA         Social Determinants of Health (SDOH) Interventions  No SDOH interventions requested or needed at this time.  Readmission Risk Interventions Readmission Risk Prevention Plan 06/27/2021  Transportation Screening Complete  HRI or [place] Complete  Social Work Consult for [place] Planning/Counseling Complete  Palliative Care Screening Not Applicable  Medication Review [employer] photographer) Complete  Some recent data might be hidden

## 2021-07-21 NOTE — Progress Notes (Signed)
PROGRESS NOTE    Joanna [name]  FXT:[id number] DOB: 1950/11/01 DOA: 07/07/2021 PCP: [name], MD   Chief Complaint  Patient presents with   Altered Mental Status    Brief Narrative:  70 year old female with past medical history of diabetes mellitus, CKD stage II, hypertension, hypothyroidism, idiopathic left upper extremity spasm hyperplasia, chronic urinary incontinence, recent UTI was sent from the skilled nursing facility with complaints of confusion decreased oral intake.  In the ED patient was noted to have elevated blood pressure and had mild acute kidney injury with creatinine of 2.2 from baseline of 1.1.  COVID-19 was positive.  CT head scan was negative for acute finding.  Chest x-ray was unremarkable.  Ultrasound of the kidneys showed hyperechoic kidneys.  Patient was then admitted to hospital with IV fluids.  She did have fever spike during hospitalization concerning for pneumonia so Zosyn and Zithromax was initiated and speech therapy was consulted.  Patient was then admitted hospital for further evaluation and treatment. She was treated for acute metabolic encephalopathy deconditioning in the setting of Joanna [name] COVID-19 infection but not hypoxic treated with remdesivir, IV fluid hydration due to AKI and CKD.  Creatinine has improved she had brief diarrhea that improved.  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  She will benefit with short-term rehab placement at this time but I suspect she will need long-term placement as she is not safe to return home and live by herself.   Assessment & Plan:   Active Problems:   AKI (acute kidney injury) ([place])   Protein-calorie malnutrition, severe    COVID  Acute metabolic encephalopathy  Microvascular ischemic changes  Left Sided Weaknss: She's had Joanna [name] rapid decline over what sounds like the past month Living independently in July - sounds like she was close to her baseline until early Sept?  At baseline, walked without assistance and able to live independently.  Decline seems more impressive than might be expected related to deconditioning, covid, pneumonia, and chronic microvascular changes -> given abrupt decline, will ask neurology to comment Appreciate neurology recommendations - they suspect her encephalopathy is multifactorial (deconditioning, covid/pneumonia, moderately advanced chronic microvascular disease) I discussed with Dr. Maurine Minister today question of left lower extremity weakness, which was less well documented in past (note of mild lower extremity weakness in 9/24 progress note, but I don't see it well explained)  - recommended T/L/C spine MRI -> motion degraded, no high grade stenosis noted throughout cervical, thoracic, lumbar spines.  Moderate right and mild L neural foraminal narrowing at L2-3.  Mild right and moderate left neural foraminal narrowing at L3-4.    - neurology recommended outpatient folow up EMG/NCS LUE weakness is chronic, she's following outpatient for that MRI brain without acute intracranial abnormality, age related cerebral atrophy, moderately advanced chronic microvascular ischemic disease TSH 9/22 wnl, B12 >7500 on 9/24.  RPR non reactive.  EEG with mild diffuse encephalopathy Appreciate neurology recommendations At this time patient unable to go home and stay by herself and will need further rehabilitation PT OT may need long-term placement down the road. Cont asa, statins.   Failure to thrive with poor oral intake in the setting of COVID-19 infection.  Continue PT OT, supportive care nutrition supplement. Will need to discuss PO intake prior to discussion of discharge  COVID-19 infection symptomatic  with generalized weakness, not hypoxic.  Inflammatory markers were improving crp 9.7> 5.6 She's completed remdesivir   Right infrahilar airspace disease/pneumonia, bacterial with elevated procalcitonin patient completed antibiotics, seen by speech.   AKI on CKD stage IIIb baseline creatinine flcutuates widely, appears to be mostly in CKD III b range looking back  Creatinine fluctuating    Hypertension blood pressure stable HCTZ ARB has been discontinued.  Prn oral hydral ordered.  Follow BP.   Chronic left upper extremity spastic weakness/debility: Continue PT OT   Fall/near syncope: In the setting of COVID-19 infection with dehydration echo with normal EF recently 2 months ago.  Continue PT OT   Protein-calorie malnutrition, severe: Augment nutritional status at supplement  Diarrhea likely due to COVID-19.  Resolved  Hypercalcemia from dehydration resolved  Hypoglycemia-resolved, off fluids  Iron deficiency anemia hemoglobin stable continue iron supplementation  Hypothyroidism on Synthroid  HLD on Crestor  DVT prophylaxis: heparin Code Status: full  Family Communication: daughter over phone - no answer 10/6 Disposition:   Status is: Inpatient  Remains inpatient appropriate because:Inpatient level of care appropriate due to severity of illness  Dispo: The patient is from: Home              Anticipated d/c is to: SNF              Patient currently is not medically stable to d/c.   Difficult to place patient No       Consultants:  neurology  Procedures: EEG IMPRESSION: This study is suggestive of mild diffuse encephalopathy, nonspecific etiology. No seizures or epileptiform discharges were seen throughout the recording.  Antimicrobials:  Anti-infectives (From admission, onward)    Start     Dose/Rate Route Frequency Ordered Stop   07/14/21 1200  amoxicillin-clavulanate (AUGMENTIN) 500-125 MG per tablet 500 mg        1 tablet Oral Every 12 hours 07/14/21 1114  07/17/21 0959   07/10/21 1045  piperacillin-tazobactam (ZOSYN) IVPB 3.375 g  Status:  Discontinued        3.375 g 12.5 mL/hr over 240 Minutes Intravenous Every 8 hours 07/10/21 0954 07/14/21 1114   07/10/21 1000  azithromycin (ZITHROMAX) tablet 500 mg        500 mg Oral Daily 07/10/21 0842 07/12/21 1114   07/09/21 1000  remdesivir 100 mg in sodium chloride 0.9 % 100 mL IVPB       See Hyperspace for full Linked Orders Report.   100 mg 200 mL/hr over 30 Minutes Intravenous Daily 07/08/21 1040 07/10/21 1014   07/08/21 1130  remdesivir 200 mg in sodium chloride 0.9% 250 mL IVPB       See Hyperspace for full Linked Orders Report.   200 mg 580 mL/hr over 30 Minutes Intravenous Once 07/08/21 1040 07/08/21 1415          Subjective: Confused, doesn't say much No pain  Objective: Vitals:   07/20/21 1744 07/20/21 2145 07/21/21 0553 07/21/21 0918  BP: (!) 123/92 (!) 128/92 122/87 (!) 141/84  Pulse: (!) 108 (!) 105 100 100  Resp: 18 18 16 16   Temp: 97.6 F (36.4 C) 98 F (36.7 C) 98.4 F (36.9 C) 98.2 F (36.8 C)  TempSrc:   Oral   SpO2: 100% 99% 100% 98%  Weight:      Height:        Intake/Output Summary (Last 24 hours) at 07/21/2021 1751 Last data filed at 07/21/2021 1300 Gross per 24 hour  Intake 400  ml  Output 550 ml  Net -150 ml   Filed Weights   07/14/21 0500 07/16/21 2334 07/18/21 2052  Weight: 53.1 kg 52.2 kg 51.6 kg    Examination:  General: No acute distress. Cardiovascular: RRR Lungs: unlabored Abdomen: Soft, nontender, nondistended  Neurological: disoriented, more alert this AM.  LUE contracture, LLE withdraws from pain more notably today.  Moving RUE and RLE spontaneously. Skin: Warm and dry. No rashes or lesions. Extremities: No clubbing or cyanosis. No edema.      Data Reviewed: I have personally reviewed following labs and imaging studies  CBC: Recent Labs  Lab 07/15/21 0200 07/16/21 0350 07/17/21 0536 07/18/21 0116 07/21/21 0346  WBC 6.2  6.3 5.7 7.6 5.8  NEUTROABS  --   --  3.3  --  3.4  HGB 8.3* 8.4* 7.6* 8.9* 8.4*  HCT 26.4* 26.0* 23.6* 28.2* 27.0*  MCV 91.7 90.9 91.5 91.6 91.8  PLT 216 241 215 276 326    Basic Metabolic Panel: Recent Labs  Lab 07/15/21 0200 07/16/21 0350 07/17/21 0714 07/18/21 0116 07/21/21 0346  NA 140 138 139 139 140  K 3.7 3.8 4.1 4.0 4.2  CL 114* 113* 113* 114* 109  CO2 20* 17* 18* 20* 21*  GLUCOSE 128* 86 96 108* 85  BUN 20 15 15 14  29*  CREATININE 1.55* 1.29* 1.28* 1.28* 1.48*  CALCIUM 9.7 9.6 9.8 10.0 10.2  MG  --   --   --   --  2.2  PHOS  --   --   --   --  3.5    GFR: Estimated Creatinine Clearance: 28.4 mL/min (Miku Udall) (by C-G formula based on SCr of 1.48 mg/dL (H)).  Liver Function Tests: Recent Labs  Lab 07/21/21 0346  AST 68*  ALT 62*  ALKPHOS 69  BILITOT 0.4  PROT 6.0*  ALBUMIN 2.7*    CBG: Recent Labs  Lab 07/20/21 0559 07/20/21 1136 07/20/21 1747 07/21/21 0642 07/21/21 1201  GLUCAP 94 96 126* 95 146*     No results found for this or any previous visit (from the past 240 hour(s)).       Radiology Studies: MR CERVICAL SPINE WO CONTRAST  Result Date: 07/21/2021 CLINICAL DATA:  Left-sided weakness. EXAM: MRI CERVICAL, THORACIC AND LUMBAR SPINE WITHOUT CONTRAST TECHNIQUE: Multiplanar and multiecho pulse sequences of the cervical spine, to include the craniocervical junction and cervicothoracic junction, and thoracic and lumbar spine, were obtained without intravenous contrast. COMPARISON:  MRI of the cervical spine January 03, 2019. MRI of the lumbar spine Feb 25, 2013. FINDINGS: MRI CERVICAL SPINE FINDINGS The study is severely degraded by motion. Axial images are essentially nondiagnostic. Alignment: Straightening of the cervical curvature. Vertebrae: Postsurgical changes from C4 through C6 ACDF. No gross marrow signal abnormality or edema. Cord: Grossly unremarkable. Posterior Fossa, vertebral arteries, paraspinal tissues: Negative. Disc levels: Posterior  disc osteophyte complexes are noted at C3-4, C6-7 and C7-T1 causing indentation on the thecal sac without significant spinal canal stenosis. Small endplate ridging at Z1-2 also causes small indentation of the thecal sac without significant spinal canal stenosis. No significant mass effect on the cord. Assessment for neural foraminal stenosis is precluded by motion artifacts. MRI THORACIC SPINE FINDINGS The study is severely degraded by motion. Images are essentially nondiagnostic. Alignment:  Physiologic. Vertebrae: Grossly unremarkable. Cord:  No high-grade cord compression. Paraspinal and other soft tissues: Negative. Disc levels: Posterior disc protrusion at T1-2 and prominence of the ligamentum flavum on the left Millard Bautch causing indentation on  the thecal sac but does not appear to cause high-grade spinal canal stenosis. No high-grade spinal canal stenosis seen throughout the thoracic spine. Evaluation of the neural foramen is precluded by motion artifacts. MRI LUMBAR SPINE FINDINGS The study is degraded by motion, particularly the axial images and sagittal STIR. Segmentation: Standard. Alignment:  Physiologic. Vertebrae:  No fracture, evidence of discitis, or bone lesion. Conus medullaris and cauda equina: Conus extends to the L1-2 level. Conus and cauda equina appear grossly unremarkable. Paraspinal and other soft tissues: Negative. Disc levels: T12-L1: No spinal canal or neural foraminal stenosis. L1-2: No spinal canal or neural foraminal stenosis. L2-3: Right asymmetric disc bulge resulting in moderate right and mild left neural foraminal narrowing. No spinal canal stenosis. Findings have progressed since prior MRI. L3-4: Left asymmetric disc bulge and mild facet degenerative changes resulting in narrowing of the left subarticular zone, mild right and moderate left neural foraminal narrowing. No significant spinal canal stenosis. Findings have progressed since prior MRI. L4-5: Shallow disc bulge and facet  degenerative changes without significant spinal canal or neural foraminal stenosis. L5-S1: Facet degenerative changes. No spinal canal or neural foraminal stenosis. IMPRESSION: 1. Severely degraded study by motion artifacts with most sequences being essentially nondiagnostic, particularly in the cervical and thoracic spine. 2. No high-grade spinal canal stenosis noted throughout the cervical, thoracic and lumbar spine. 3. Moderate right and mild left neural foraminal narrowing at L2-3. 4. Mild right and moderate left neural foraminal narrowing at L3-4. 5. Assessment of neural foraminal stenosis in the cervical and thoracic spine Electronically Signed   By: Pedro Earls M.D.   On: 07/21/2021 16:15   MR THORACIC SPINE WO CONTRAST  Result Date: 07/21/2021 CLINICAL DATA:  Left-sided weakness. EXAM: MRI CERVICAL, THORACIC AND LUMBAR SPINE WITHOUT CONTRAST TECHNIQUE: Multiplanar and multiecho pulse sequences of the cervical spine, to include the craniocervical junction and cervicothoracic junction, and thoracic and lumbar spine, were obtained without intravenous contrast. COMPARISON:  MRI of the cervical spine January 03, 2019. MRI of the lumbar spine Feb 25, 2013. FINDINGS: MRI CERVICAL SPINE FINDINGS The study is severely degraded by motion. Axial images are essentially nondiagnostic. Alignment: Straightening of the cervical curvature. Vertebrae: Postsurgical changes from C4 through C6 ACDF. No gross marrow signal abnormality or edema. Cord: Grossly unremarkable. Posterior Fossa, vertebral arteries, paraspinal tissues: Negative. Disc levels: Posterior disc osteophyte complexes are noted at C3-4, C6-7 and C7-T1 causing indentation on the thecal sac without significant spinal canal stenosis. Small endplate ridging at M8-4 also causes small indentation of the thecal sac without significant spinal canal stenosis. No significant mass effect on the cord. Assessment for neural foraminal stenosis is precluded  by motion artifacts. MRI THORACIC SPINE FINDINGS The study is severely degraded by motion. Images are essentially nondiagnostic. Alignment:  Physiologic. Vertebrae: Grossly unremarkable. Cord:  No high-grade cord compression. Paraspinal and other soft tissues: Negative. Disc levels: Posterior disc protrusion at T1-2 and prominence of the ligamentum flavum on the left Katera Rybka causing indentation on the thecal sac but does not appear to cause high-grade spinal canal stenosis. No high-grade spinal canal stenosis seen throughout the thoracic spine. Evaluation of the neural foramen is precluded by motion artifacts. MRI LUMBAR SPINE FINDINGS The study is degraded by motion, particularly the axial images and sagittal STIR. Segmentation: Standard. Alignment:  Physiologic. Vertebrae:  No fracture, evidence of discitis, or bone lesion. Conus medullaris and cauda equina: Conus extends to the L1-2 level. Conus and cauda equina appear grossly unremarkable. Paraspinal and other  soft tissues: Negative. Disc levels: T12-L1: No spinal canal or neural foraminal stenosis. L1-2: No spinal canal or neural foraminal stenosis. L2-3: Right asymmetric disc bulge resulting in moderate right and mild left neural foraminal narrowing. No spinal canal stenosis. Findings have progressed since prior MRI. L3-4: Left asymmetric disc bulge and mild facet degenerative changes resulting in narrowing of the left subarticular zone, mild right and moderate left neural foraminal narrowing. No significant spinal canal stenosis. Findings have progressed since prior MRI. L4-5: Shallow disc bulge and facet degenerative changes without significant spinal canal or neural foraminal stenosis. L5-S1: Facet degenerative changes. No spinal canal or neural foraminal stenosis. IMPRESSION: 1. Severely degraded study by motion artifacts with most sequences being essentially nondiagnostic, particularly in the cervical and thoracic spine. 2. No high-grade spinal canal stenosis  noted throughout the cervical, thoracic and lumbar spine. 3. Moderate right and mild left neural foraminal narrowing at L2-3. 4. Mild right and moderate left neural foraminal narrowing at L3-4. 5. Assessment of neural foraminal stenosis in the cervical and thoracic spine Electronically Signed   By: Pedro Earls M.D.   On: 07/21/2021 16:15   MR LUMBAR SPINE WO CONTRAST  Result Date: 07/21/2021 CLINICAL DATA:  Left-sided weakness. EXAM: MRI CERVICAL, THORACIC AND LUMBAR SPINE WITHOUT CONTRAST TECHNIQUE: Multiplanar and multiecho pulse sequences of the cervical spine, to include the craniocervical junction and cervicothoracic junction, and thoracic and lumbar spine, were obtained without intravenous contrast. COMPARISON:  MRI of the cervical spine January 03, 2019. MRI of the lumbar spine Feb 25, 2013. FINDINGS: MRI CERVICAL SPINE FINDINGS The study is severely degraded by motion. Axial images are essentially nondiagnostic. Alignment: Straightening of the cervical curvature. Vertebrae: Postsurgical changes from C4 through C6 ACDF. No gross marrow signal abnormality or edema. Cord: Grossly unremarkable. Posterior Fossa, vertebral arteries, paraspinal tissues: Negative. Disc levels: Posterior disc osteophyte complexes are noted at C3-4, C6-7 and C7-T1 causing indentation on the thecal sac without significant spinal canal stenosis. Small endplate ridging at K4-8 also causes small indentation of the thecal sac without significant spinal canal stenosis. No significant mass effect on the cord. Assessment for neural foraminal stenosis is precluded by motion artifacts. MRI THORACIC SPINE FINDINGS The study is severely degraded by motion. Images are essentially nondiagnostic. Alignment:  Physiologic. Vertebrae: Grossly unremarkable. Cord:  No high-grade cord compression. Paraspinal and other soft tissues: Negative. Disc levels: Posterior disc protrusion at T1-2 and prominence of the ligamentum flavum on the  left Jaleiyah Alas causing indentation on the thecal sac but does not appear to cause high-grade spinal canal stenosis. No high-grade spinal canal stenosis seen throughout the thoracic spine. Evaluation of the neural foramen is precluded by motion artifacts. MRI LUMBAR SPINE FINDINGS The study is degraded by motion, particularly the axial images and sagittal STIR. Segmentation: Standard. Alignment:  Physiologic. Vertebrae:  No fracture, evidence of discitis, or bone lesion. Conus medullaris and cauda equina: Conus extends to the L1-2 level. Conus and cauda equina appear grossly unremarkable. Paraspinal and other soft tissues: Negative. Disc levels: T12-L1: No spinal canal or neural foraminal stenosis. L1-2: No spinal canal or neural foraminal stenosis. L2-3: Right asymmetric disc bulge resulting in moderate right and mild left neural foraminal narrowing. No spinal canal stenosis. Findings have progressed since prior MRI. L3-4: Left asymmetric disc bulge and mild facet degenerative changes resulting in narrowing of the left subarticular zone, mild right and moderate left neural foraminal narrowing. No significant spinal canal stenosis. Findings have progressed since prior MRI. L4-5: Shallow disc  bulge and facet degenerative changes without significant spinal canal or neural foraminal stenosis. L5-S1: Facet degenerative changes. No spinal canal or neural foraminal stenosis. IMPRESSION: 1. Severely degraded study by motion artifacts with most sequences being essentially nondiagnostic, particularly in the cervical and thoracic spine. 2. No high-grade spinal canal stenosis noted throughout the cervical, thoracic and lumbar spine. 3. Moderate right and mild left neural foraminal narrowing at L2-3. 4. Mild right and moderate left neural foraminal narrowing at L3-4. 5. Assessment of neural foraminal stenosis in the cervical and thoracic spine Electronically Signed   By: Pedro Earls M.D.   On: 07/21/2021 16:15   EEG  adult  Result Date: 07/20/2021 Lora Havens, MD     07/20/2021  4:57 PM Patient Name: Joanna [name] MRN: [id number] Epilepsy Attending: Lora Havens Referring Physician/Provider: Dr Fayrene Helper Date: 07/20/2021 Duration: 22.57 mins Patient history: 70yo F with ams. EEG to evaluate for seizure Level of alertness: Awake AEDs during EEG study: TPM Technical aspects: This EEG study was done with scalp electrodes positioned according to the 10-20 International system of electrode placement. Electrical activity was acquired at [place] sampling rate of 500Hz  and reviewed with Rosi Secrist high frequency filter of 70Hz  and Lilley Hubble low frequency filter of 1Hz . EEG data were recorded continuously and digitally stored. Description: The posterior dominant rhythm consists of 8Hz  activity of moderate voltage (25-35 uV) seen predominantly in posterior head regions, symmetric and reactive to eye opening and eye closing. EEG showed intermittent generalized 3 to 6 Hz theta-delta slowing. Physiologic photic driving was not seen during photic stimulation.  Hyperventilation was not performed.   ABNORMALITY - Intermittent slow, generalized IMPRESSION: This study is suggestive of mild diffuse encephalopathy, nonspecific etiology. No seizures or epileptiform discharges were seen throughout the recording. Priyanka Barbra Sarks        Scheduled Meds:  vitamin C  500 mg Oral Daily   aspirin EC  81 mg Oral Daily   famotidine  20 mg Oral Daily   feeding supplement  237 mL Oral BID BM   ferrous sulfate  325 mg Oral Q supper   heparin  5,000 Units Subcutaneous Q12H   levothyroxine  88 mcg Oral Q0600   loratadine  10 mg Oral Daily   multivitamin with minerals  1 tablet Oral Daily   rosuvastatin  20 mg Oral Daily   thiamine  100 mg Oral Daily   topiramate  50 mg Oral BID   traZODone  25-50 mg Oral QHS   zinc sulfate  220 mg Oral Daily   Continuous Infusions:   LOS: 13 days    Time spent: over 30 min    Fayrene Helper, MD [facility]   To contact the attending provider between 7A-7P or the covering provider during after hours 7P-7A, please log into the web site [email or web address] and access using universal [place] password for that web site. If you do not have the password, please call the hospital operator.  07/21/2021, 5:51 PM

## 2021-07-22 DIAGNOSIS — U071 COVID-19: Secondary | ICD-10-CM | POA: Diagnosis not present

## 2021-07-22 DIAGNOSIS — Z66 Do not resuscitate: Secondary | ICD-10-CM | POA: Diagnosis not present

## 2021-07-22 DIAGNOSIS — Z7189 Other specified counseling: Secondary | ICD-10-CM

## 2021-07-22 DIAGNOSIS — E43 Unspecified severe protein-calorie malnutrition: Secondary | ICD-10-CM

## 2021-07-22 DIAGNOSIS — Z515 Encounter for palliative care: Secondary | ICD-10-CM

## 2021-07-22 LAB — CBC WITH DIFFERENTIAL/PLATELET
Abs Immature Granulocytes: 0.03 10*3/uL (ref 0.00–0.07)
Basophils Absolute: 0.1 10*3/uL (ref 0.0–0.1)
Basophils Relative: 1 %
Eosinophils Absolute: 0.1 10*3/uL (ref 0.0–0.5)
Eosinophils Relative: 2 %
HCT: 28.7 % — ABNORMAL LOW (ref 36.0–46.0)
Hemoglobin: 8.8 g/dL — ABNORMAL LOW (ref 12.0–15.0)
Immature Granulocytes: 1 %
Lymphocytes Relative: 29 %
Lymphs Abs: 1.9 10*3/uL (ref 0.7–4.0)
MCH: 28.5 pg (ref 26.0–34.0)
MCHC: 30.7 g/dL (ref 30.0–36.0)
MCV: 92.9 fL (ref 80.0–100.0)
Monocytes Absolute: 0.5 10*3/uL (ref 0.1–1.0)
Monocytes Relative: 7 %
Neutro Abs: 4 10*3/uL (ref 1.7–7.7)
Neutrophils Relative %: 60 %
Platelets: 352 10*3/uL (ref 150–400)
RBC: 3.09 MIL/uL — ABNORMAL LOW (ref 3.87–5.11)
RDW: 14.6 % (ref 11.5–15.5)
WBC: 6.6 10*3/uL (ref 4.0–10.5)
nRBC: 0 % (ref 0.0–0.2)

## 2021-07-22 LAB — COMPREHENSIVE METABOLIC PANEL
ALT: 52 U/L — ABNORMAL HIGH (ref 0–44)
AST: 40 U/L (ref 15–41)
Albumin: 2.9 g/dL — ABNORMAL LOW (ref 3.5–5.0)
Alkaline Phosphatase: 67 U/L (ref 38–126)
Anion gap: 8 (ref 5–15)
BUN: 30 mg/dL — ABNORMAL HIGH (ref 8–23)
CO2: 23 mmol/L (ref 22–32)
Calcium: 11 mg/dL — ABNORMAL HIGH (ref 8.9–10.3)
Chloride: 108 mmol/L (ref 98–111)
Creatinine, Ser: 1.43 mg/dL — ABNORMAL HIGH (ref 0.44–1.00)
GFR, Estimated: 40 mL/min — ABNORMAL LOW (ref >60)
Glucose, Bld: 112 mg/dL — ABNORMAL HIGH (ref 70–99)
Potassium: 4.3 mmol/L (ref 3.5–5.1)
Sodium: 139 mmol/L (ref 135–145)
Total Bilirubin: 0.4 mg/dL (ref 0.3–1.2)
Total Protein: 6.1 g/dL — ABNORMAL LOW (ref 6.5–8.1)

## 2021-07-22 LAB — PHOSPHORUS: Phosphorus: 3.7 mg/dL (ref 2.5–4.6)

## 2021-07-22 LAB — GLUCOSE, CAPILLARY
Glucose-Capillary: 134 mg/dL — ABNORMAL HIGH (ref 70–99)
Glucose-Capillary: 106 mg/dL — ABNORMAL HIGH (ref 70–99)
Glucose-Capillary: 124 mg/dL — ABNORMAL HIGH (ref 70–99)
Glucose-Capillary: 143 mg/dL — ABNORMAL HIGH (ref 70–99)

## 2021-07-22 LAB — MAGNESIUM: Magnesium: 2.4 mg/dL (ref 1.7–2.4)

## 2021-07-22 MED ORDER — ENSURE ENLIVE PO LIQD
237.0000 mL | Freq: Three times a day (TID) | ORAL | Status: DC
  Administered 2021-07-22 – 2021-07-25 (×9): 237 mL via ORAL

## 2021-07-22 NOTE — Progress Notes (Signed)
Physical Therapy Treatment Patient Details Name: Joanna [name] MRN: [id number] DOB: 10-23-1950 Today's Date: 07/22/2021   History of Present Illness 70 y.o. female presents to [facility] hsp[facility] on 07/07/2021 with increasing confusion and fall. Pt found to be COVID-19+ in ED. Pt admitted for AKI and acute metabolic encephalopathy. PMH includes IIDM, CKD stage II, HTN, hypothyroidism, idiopathic left upper extremity spasm hyperplasia,, chronic urinary incontinence, recent UTI.    PT Comments    Pt is unable to consistently follow commands or converse during session at this time. PT assist pt in multiple totalA transfers, with little effort from pt to stand during session. Pt does demonstrate some postural reaction to release of PT support, descending more slowly than expected compared to the little muscular effort put forth during standing. Pt also demonstrate significant waxing and waning of cognition, alert and conversing with family when present upon PT's first attempt this morning, yet lethargic and not responding to PT for most of this session. Pt then becomes very alert when her cell phone rings during session, turning head and asking for phone. PT will consider reducing frequency if patient participation remains limited.   Recommendations for follow up therapy are one component of a multi-disciplinary discharge planning process, led by the attending physician.  Recommendations may be updated based on patient status, additional functional criteria and insurance authorization.  Follow Up Recommendations  SNF     Equipment Recommendations  Wheelchair (measurements PT);Wheelchair cushion (measurements PT);Hospital bed (hoyer lift)    Recommendations for Other Services       Precautions / Restrictions Precautions Precautions: Fall Precaution Comments: LUE contracture Restrictions Weight Bearing Restrictions: No     Mobility  Bed Mobility                    Transfers Overall  transfer level: Needs assistance Equipment used: 1 person hand held assist Transfers: Sit to/from Stand Sit to Stand: Total assist         General transfer comment: PT provides knee block and total initiation of stand. Pt appears to provide some effort from LEs to extend knees however is unable to stand without significant PT assistance. When PT releases support pt does not quickly fall to chair as expected but shows a more slow descent.  Ambulation/Gait                 Stairs             Wheelchair Mobility    Modified Rankin (Stroke Patients Only)       Balance Overall balance assessment: Needs assistance Sitting-balance support: No upper extremity supported;Feet supported Sitting balance-Leahy Scale: Poor   Postural control: Posterior lean Standing balance support: Bilateral upper extremity supported Standing balance-Leahy Scale: Zero Standing balance comment: maxA-totalA                            Cognition Arousal/Alertness: Awake/alert (pt intermittently closing eyes or with eyes partially opened) Behavior During Therapy: Flat affect Overall Cognitive Status: Impaired/Different from baseline Area of Impairment: Attention;Memory;Following commands;Safety/judgement;Awareness;Problem solving                   Current Attention Level: Focused Memory: Decreased recall of precautions;Decreased short-term memory Following Commands: Follows one step commands inconsistently Safety/Judgement: Decreased awareness of safety;Decreased awareness of deficits Awareness: Intellectual Problem Solving: Slow processing;Decreased initiation General Comments: pt with very inconsistent response to PT verbal and tactile cues. Often not participating in  PT conversation. Pt does quickly become alert when her cell phone rings, turning head to phone, reaching toward phone. Pt also does smile at points during PT conversation, but does not respond after initial  2-3 minutes.      Exercises      General Comments General comments (skin integrity, edema, etc.): VSS on RA      Pertinent Vitals/Pain Pain Assessment: Faces Faces Pain Scale: No hurt    Home Living                      Prior Function            PT Goals (current goals can now be found in the care plan section) Acute Rehab PT Goals Patient Stated Goal: did not state PT Goal Formulation: Patient unable to participate in goal setting Time For Goal Achievement: 08/05/21 Potential to Achieve Goals: Poor Progress towards PT goals: Not progressing toward goals - comment (poor pt participation)    Frequency    Min 2X/week (may need to reduce to 1x/week if pt participation remains limited)      PT Plan Current plan remains appropriate    Co-evaluation              AM-PAC PT "6 Clicks" Mobility   Outcome Measure  Help needed turning from your back to your side while in a flat bed without using bedrails?: Total Help needed moving from lying on your back to sitting on the side of a flat bed without using bedrails?: Total Help needed moving to and from a bed to a chair (including a wheelchair)?: Total Help needed standing up from a chair using your arms (e.g., wheelchair or bedside chair)?: Total Help needed to walk in hospital room?: Total Help needed climbing 3-5 steps with a railing? : Total 6 Click Score: 6    End of Session   Activity Tolerance: Other (comment) (limited by pt participation) Patient left: in chair;with call bell/phone within reach;with chair alarm set Nurse Communication: Mobility status;Need for lift equipment PT Visit Diagnosis: Other abnormalities of gait and mobility (R26.89);Muscle weakness (generalized) (M62.81);History of falling (Z91.81);Other symptoms and signs involving the nervous system (R29.898)     Time: 7741-2878 PT Time Calculation (min) (ACUTE ONLY): 25 min  Charges:  $Therapeutic Activity: 23-37 mins                      Zenaida Niece, PT, DPT Acute Rehabilitation Pager: [phone]    Zenaida Niece 07/22/2021, 5:04 PM

## 2021-07-22 NOTE — Consult Note (Addendum)
Palliative Medicine Inpatient Consult Note  Consulting Provider: Elodia Florence., MD  Reason for consult:   [place] Palliative Medicine Consult  Reason for Consult? goc    HPI:  Per intake H&P --> 70 year old female with past medical history of diabetes mellitus, CKD stage II, hypertension, hypothyroidism, idiopathic left upper extremity spasm hyperplasia, chronic urinary incontinence, recent UTI was sent from the skilled nursing facility with complaints of confusion decreased oral intake.  Admitted and treated for acute metabolic encephalopathy deconditioning in the setting of a COVID-19 infection but not hypoxic treated with remdesivir, IV fluid hydration due to AKI and CKD.  Creatinine has improved she had brief diarrhea that improved.  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  She will benefit with short-term rehab placement at this time but I suspect she will need long-term placement as she is not safe to return home and live by herself.  Palliative care has been asked to get involved to further aid in goals of care conversations in the setting of increased frailty and FTT. Patient will discharge to [facility] once medically optimized.   Palliative care will offer an introduction of concepts related to care.   Clinical Assessment/Goals of Care:  *Please note that this is a verbal dictation therefore any spelling or grammatical errors are due to the "[facility] One" system interpretation.  I have reviewed medical records including EPIC notes, labs and imaging, received report from bedside RN, assessed the patient who is resting in bed minimally responsive to me  though she does share, "I ain't worth two penny's". I requested that she tell me more about what she means by this but     I called patients daughter, Fonda Kinder, son, Gerald Stabs and daughter, Lilian Kapur to further discuss diagnosis prognosis, GOC, EOL wishes, disposition and options. I was joined during this conversation by Dr. Florene Glen.  We reviewed Perline's decline since July 4th. She had at that time been able to mobilize well with a FWW despite her known L hand deficits which have been progressively worsening. She was talking clearly. She endured a fall which has led to a series of downward events suspected to be in the setting of COVID infection.   Kaleia has a past history of  LUE hyperplasia, HTN, CKD II, DM, and urinary incontinent.    I introduced Palliative Medicine as specialized medical care for people living with serious illness. It focuses on providing relief from the symptoms and stress of a serious illness. The goal is to improve quality of life for both the patient and the family.  Collen is from [place]. She had been living independently prior to hospitalizations. She is widowed and has three children, two of whom are out of state.  Prior to admission she had been living by herself and was independent for all basic activities and instrumental activities of daily living.  She is identified by her family is a woman who took great pride in her independence.  She is a woman of faith though is nondenominational.  A detailed discussion was had today regarding advanced directives, we have none filed.    Concepts specific to code status, artifical feeding and hydration, continued IV antibiotics and rehospitalization was had.  A comprehensive conversation pertaining to CODE STATUS was held.  Patient  is now DO NOT RESUSCITATE DO NOT INTUBATE CODE STATUS.  Her family attest that she would never want prolonged heroic measures to sustain life.  Patient's family says "you will break her" if you try to pursue those  measures.   The difference between a aggressive medical intervention path  and a palliative comfort care path for this patient at this time was had.  We reviewed the differences between continuing with the comfort level of care and placing a core track feeding tube versus allowing Len to be comfortable and passed away as peacefully as possible.  We discussed the difficulties associated that with this decision.  Patient's daughter Fonda Kinder and son Gerald Stabs are both of the mindset that she would not wish for prolonged measures to sustain life.  Patient's daughter Virgilio Belling is having a very hard time and shares "I do not know what to do".   Is a family - daughter Marissa Calamity, son, Gerald Stabs and daughter, Lilian Kapur all acknowledge how difficult these decisions are.  Virgilio Belling shares that she had asked her mom earlier what her wishes would be though was unable to get a clear answer.  She did ask her "are you scared" and the patient said "I am not scared".  It is unclear what this was pertaining to though it does seem as if the patient has some wherewithal as to the direction her life may be heading.  Dr. Florene Glen and I allowed time for questions and shared that there is no urgency to make any decisions presently.  We shared that it would be of benefit to take a night to talk amongst each other and think about the next steps.  The final thought which was left with patient's family is that if family were able to talk for herself which she would wish for.  Discussed the importance of continued conversation with family and their  medical providers regarding overall plan of care and treatment options, ensuring decisions are within the context of the patients values and GOCs.  Decision Maker: Patient has 3 children who are all joint decision makers  SUMMARY OF RECOMMENDATIONS   DNAR/DNI  Continue current measures of care  Palliative care will speak to patient's family again tomorrow to further identify goals moving  forward  Ongoing palliative care support during this difficult time  Please refer to below addendum as plan of care has changed patient's family would now like to pursue gastrostomy tube placement  Code Status/Advance Care Planning: DNAR/DNI  Palliative Prophylaxis:  Oral care, turn every 2 hours, delirium precautions  Additional Recommendations (Limitations, Scope, Preferences): Continue current level of care   Psycho-social/Spiritual:  Desire for further Chaplaincy support: No Additional Recommendations: 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 intake.  Discharge Planning: Discharge plan presently uncertain.  Vitals:   07/21/21 2039 07/22/21 0539  BP: (!) 183/123 129/86  Pulse: (!) 109 (!) 105  Resp: 18 18  Temp: (!) 97.5 F (36.4 C) 98.2 F (36.8 C)  SpO2: 97% 100%    Intake/Output Summary (Last 24 hours) at 07/22/2021 0650 Last data filed at 07/22/2021 0421 Gross per 24 hour  Intake 457 ml  Output 250 ml  Net 207 ml   Last Weight  Most recent update: 07/22/2021  4:22 AM    Weight  51.2 kg (112 lb 14 oz)            Gen: Frail elderly African-American female in no acute distress  HEENT: moist mucous membranes CV: Regular rate and rhythm, no murmurs rubs or gallops PULM: clear to auscultation bilaterally ABD: soft/nontender EXT: No edema Neuro: Extremely somnolent though episodically responsive  PPS: 10%   This conversation/these recommendations were discussed with patient primary care team, Dr. Florene Glen  Time In: 1300 Time Out: 1430 Total Time: 90 minutes Greater than 50%  of this time was spent counseling and coordinating care related to the above assessment and plan. _____________________________________________________________ Addendum:  I spoke to patient's daughter, Fonda Kinder this evening.  She shares with me that her sister, Lilian Kapur had met with [facility].  Per their conversation Navpreet had  endorsed not being ready to "give up".  She shared that she still wanted to fight and improve her present situation.  Patient's family has then decided to proceed with gastrostomy tube placement.  I went to Evalie's bedside to better understand her perspective.  Any did share with me that she is not ready to give up.  It does not seem that she is certain of what a gastrostomy tube is therefore we will need additional conversations with her to assert that she realizes the long-term effect of that.  I was able to get a meeting into the bedside chair with the help of her nursing student.  Zanayah was two-person assistance.  Time in: 1515 Time Out: 1555 Additional Time: 56 minutes  [place] Team Team Cell Phone: [phone] Please utilize secure chat with additional questions, if there is no response within 30 minutes please call the above phone number  Palliative Medicine Team providers are available by phone from 7am to 7pm daily and can be reached through the team cell phone.  Should this patient require assistance outside of these hours, please call the patient's attending physician.

## 2021-07-22 NOTE — Progress Notes (Signed)
PROGRESS NOTE    Joanna [name]  WYO:[id number] DOB: 1951-03-24 DOA: 07/07/2021 PCP: [name], MD   Chief Complaint  Patient presents with   Altered Mental Status    Brief Narrative:  70 year old female with past medical history of diabetes mellitus, CKD stage II, hypertension, hypothyroidism, idiopathic left upper extremity spasm hyperplasia, chronic urinary incontinence, recent UTI was sent from the skilled nursing facility with complaints of confusion decreased oral intake.  In the ED patient was noted to have elevated blood pressure and had mild acute kidney injury with creatinine of 2.2 from baseline of 1.1.  COVID-19 was positive.  CT head scan was negative for acute finding.  Chest x-ray was unremarkable.  Ultrasound of the kidneys showed hyperechoic kidneys.  Patient was then admitted to hospital with IV fluids.  She did have fever spike during hospitalization concerning for pneumonia so Zosyn and Zithromax was initiated and speech therapy was consulted.  Patient was then admitted hospital for further evaluation and treatment. She was treated for acute metabolic encephalopathy deconditioning in the setting of Moishy Laday COVID-19 infection but not hypoxic treated with remdesivir, IV fluid hydration due to AKI and CKD.  Creatinine has improved she had brief diarrhea that improved.  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  She will benefit with short-term rehab placement at this time but I suspect she will need long-term placement as she is not safe to return home and live by herself.   Assessment & Plan:   Active Problems:   AKI (acute kidney injury) ([place])   Protein-calorie malnutrition, severe    COVID  Severe Malnutrition  Poor PO Intake 9/30 RD recommended consideration of cortrak/NGT placement for nutrition support PO intake remains poor -> yesterday no breakfast/dinner, 25% lunch, 2 ensure Follow calorie count May need to consider NGT/tube feeding  Goals of Care Discussion with children today over phone.  Discussed her delirium/decline with recent hospitalizations.  Multiple potential causes, covid likely contributing.  Most concerning thing is her poor oral intake.  Discussed options including focus on comfort vs tube feeding.  Family thought about this together, when they saw her again, she was more interactive.  Family is interested in tube feeding if this is necessary.  Acute metabolic encephalopathy  Microvascular ischemic changes  Left Sided Weaknss: She's had Moani Weipert rapid decline over what sounds like the past month Living independently in July - sounds like she was close to her baseline until early Sept?  At baseline, walked without assistance and able to live independently.  Decline seems more impressive than might be expected related to deconditioning, covid, pneumonia, and chronic microvascular changes -> given abrupt decline, will ask neurology to comment Appreciate neurology recommendations - they suspect her encephalopathy is multifactorial (deconditioning, covid/pneumonia, moderately advanced chronic microvascular disease) I discussed with Dr. Maurine Minister today question of left lower extremity weakness, which was less well documented in past (note of mild lower extremity weakness in 9/24 progress note, but I don't see it well explained)  - recommended T/L/C spine MRI -> motion degraded, no high grade stenosis noted throughout cervical, thoracic, lumbar spines.  Moderate right and mild L neural foraminal narrowing at L2-3.  Mild right and moderate left neural foraminal narrowing at L3-4.    - neurology recommended outpatient folow up EMG/NCS LUE  weakness is chronic, she's  following outpatient for that MRI brain without acute intracranial abnormality, age related cerebral atrophy, moderately advanced chronic microvascular ischemic disease TSH 9/22 wnl, B12 >7500 on 9/24.  RPR non reactive.  EEG with mild diffuse encephalopathy Appreciate neurology recommendations At this time patient unable to go home and stay by herself and will need further rehabilitation PT OT may need long-term placement down the road. Cont asa, statins.   Failure to thrive with poor oral intake in the setting of COVID-19 infection.  Continue PT OT, supportive care nutrition supplement. Will need to discuss PO intake prior to discussion of discharge   COVID-19 infection symptomatic with generalized weakness, not hypoxic.  Inflammatory markers were improving crp 9.7> 5.6 She's completed remdesivir   Right infrahilar airspace disease/pneumonia, bacterial with elevated procalcitonin patient completed antibiotics, seen by speech.   AKI on CKD stage IIIb baseline creatinine flcutuates widely, appears to be mostly in CKD III b range looking back  Creatinine fluctuating    Hypertension blood pressure stable HCTZ ARB has been discontinued.  Prn oral hydral ordered.  Follow BP.   Chronic left upper extremity spastic weakness/debility: Continue PT OT   Fall/near syncope: In the setting of COVID-19 infection with dehydration echo with normal EF recently 2 months ago.  Continue PT OT   Protein-calorie malnutrition, severe: Augment nutritional status at supplement  Diarrhea likely due to COVID-19.  Resolved  Hypercalcemia from dehydration resolved  Hypoglycemia-resolved, off fluids  Iron deficiency anemia hemoglobin stable continue iron supplementation  Hypothyroidism on Synthroid  HLD on Crestor  DVT prophylaxis: heparin Code Status: full  Family Communication: daughter Fonda Kinder) over phone, children as well during family meeting Disposition:   Status is: Inpatient  Remains  inpatient appropriate because:Inpatient level of care appropriate due to severity of illness  Dispo: The patient is from: Home              Anticipated d/c is to: SNF              Patient currently is not medically stable to d/c.   Difficult to place patient No       Consultants:  neurology  Procedures: EEG IMPRESSION: This study is suggestive of mild diffuse encephalopathy, nonspecific etiology. No seizures or epileptiform discharges were seen throughout the recording.  Antimicrobials:  Anti-infectives (From admission, onward)    Start     Dose/Rate Route Frequency Ordered Stop   07/14/21 1200  amoxicillin-clavulanate (AUGMENTIN) 500-125 MG per tablet 500 mg        1 tablet Oral Every 12 hours 07/14/21 1114 07/17/21 0959   07/10/21 1045  piperacillin-tazobactam (ZOSYN) IVPB 3.375 g  Status:  Discontinued        3.375 g 12.5 mL/hr over 240 Minutes Intravenous Every 8 hours 07/10/21 0954 07/14/21 1114   07/10/21 1000  azithromycin (ZITHROMAX) tablet 500 mg        500 mg Oral Daily 07/10/21 0842 07/12/21 1114   07/09/21 1000  remdesivir 100 mg in sodium chloride 0.9 % 100 mL IVPB       See Hyperspace for full Linked Orders Report.   100 mg 200 mL/hr over 30 Minutes Intravenous Daily 07/08/21 1040 07/10/21 1014   07/08/21 1130  remdesivir 200 mg in sodium chloride 0.9% 250 mL IVPB       See Hyperspace for full Linked Orders Report.   200 mg 580 mL/hr over 30 Minutes Intravenous Once 07/08/21 1040 07/08/21 1415  Subjective: Doeesn't say much today  Objective: Vitals:   07/22/21 0422 07/22/21 0539 07/22/21 1028 07/22/21 1630  BP:  129/86 (!) 130/95 (!) 120/96  Pulse:  (!) 105 96 (!) 110  Resp:  18 18 18   Temp:  98.2 F (36.8 C) (!) 97.4 F (36.3 C) 98.9 F (37.2 C)  TempSrc:  Oral Oral Oral  SpO2:  100% 100% (!) 77%  Weight: 51.2 kg     Height:        Intake/Output Summary (Last 24 hours) at 07/22/2021 1853 Last data filed at 07/22/2021 1300 Gross per  24 hour  Intake 220 ml  Output 250 ml  Net -30 ml   Filed Weights   07/16/21 2334 07/18/21 2052 07/22/21 0422  Weight: 52.2 kg 51.6 kg 51.2 kg    Examination:  General: No acute distress. Cardiovascular: RRR Lungs: unlabored Abdomen: Soft, nontender, nondistended Neurological: Pleasantly confused, doesn't say much Skin: Warm and dry. No rashes or lesions. Extremities: No clubbing or cyanosis. No edema.      Data Reviewed: I have personally reviewed following labs and imaging studies  CBC: Recent Labs  Lab 07/16/21 0350 07/17/21 0536 07/18/21 0116 07/21/21 0346 07/22/21 0323  WBC 6.3 5.7 7.6 5.8 6.6  NEUTROABS  --  3.3  --  3.4 4.0  HGB 8.4* 7.6* 8.9* 8.4* 8.8*  HCT 26.0* 23.6* 28.2* 27.0* 28.7*  MCV 90.9 91.5 91.6 91.8 92.9  PLT 241 215 276 334 045    Basic Metabolic Panel: Recent Labs  Lab 07/16/21 0350 07/17/21 0714 07/18/21 0116 07/21/21 0346 07/22/21 0323  NA 138 139 139 140 139  K 3.8 4.1 4.0 4.2 4.3  CL 113* 113* 114* 109 108  CO2 17* 18* 20* 21* 23  GLUCOSE 86 96 108* 85 112*  BUN 15 15 14  29* 30*  CREATININE 1.29* 1.28* 1.28* 1.48* 1.43*  CALCIUM 9.6 9.8 10.0 10.2 11.0*  MG  --   --   --  2.2 2.4  PHOS  --   --   --  3.5 3.7    GFR: Estimated Creatinine Clearance: 29.4 mL/min ([place]) (by C-G formula based on SCr of 1.43 mg/dL (H)).  Liver Function Tests: Recent Labs  Lab 07/21/21 0346 07/22/21 0323  AST 68* 40  ALT 62* 52*  ALKPHOS 69 67  BILITOT 0.4 0.4  PROT 6.0* 6.1*  ALBUMIN 2.7* 2.9*    CBG: Recent Labs  Lab 07/21/21 1201 07/22/21 0110 07/22/21 0603 07/22/21 1114 07/22/21 1817  GLUCAP 146* 134* 106* 124* 143*     No results found for this or any previous visit (from the past 240 hour(s)).       Radiology Studies: MR CERVICAL SPINE WO CONTRAST  Result Date: 07/21/2021 CLINICAL DATA:  Left-sided weakness. EXAM: MRI CERVICAL, THORACIC AND LUMBAR SPINE WITHOUT CONTRAST TECHNIQUE: Multiplanar and multiecho pulse  sequences of the cervical spine, to include the craniocervical junction and cervicothoracic junction, and thoracic and lumbar spine, were obtained without intravenous contrast. COMPARISON:  MRI of the cervical spine January 03, 2019. MRI of the lumbar spine Feb 25, 2013. FINDINGS: MRI CERVICAL SPINE FINDINGS The study is severely degraded by motion. Axial images are essentially nondiagnostic. Alignment: Straightening of the cervical curvature. Vertebrae: Postsurgical changes from C4 through C6 ACDF. No gross marrow signal abnormality or edema. Cord: Grossly unremarkable. Posterior Fossa, vertebral arteries, paraspinal tissues: Negative. Disc levels: Posterior disc osteophyte complexes are noted at C3-4, C6-7 and C7-T1 causing indentation on the thecal sac without significant  spinal canal stenosis. Small endplate ridging at J8-5 also causes small indentation of the thecal sac without significant spinal canal stenosis. No significant mass effect on the cord. Assessment for neural foraminal stenosis is precluded by motion artifacts. MRI THORACIC SPINE FINDINGS The study is severely degraded by motion. Images are essentially nondiagnostic. Alignment:  Physiologic. Vertebrae: Grossly unremarkable. Cord:  No high-grade cord compression. Paraspinal and other soft tissues: Negative. Disc levels: Posterior disc protrusion at T1-2 and prominence of the ligamentum flavum on the left Kjerstin Abrigo causing indentation on the thecal sac but does not appear to cause high-grade spinal canal stenosis. No high-grade spinal canal stenosis seen throughout the thoracic spine. Evaluation of the neural foramen is precluded by motion artifacts. MRI LUMBAR SPINE FINDINGS The study is degraded by motion, particularly the axial images and sagittal STIR. Segmentation: Standard. Alignment:  Physiologic. Vertebrae:  No fracture, evidence of discitis, or bone lesion. Conus medullaris and cauda equina: Conus extends to the L1-2 level. Conus and cauda equina  appear grossly unremarkable. Paraspinal and other soft tissues: Negative. Disc levels: T12-L1: No spinal canal or neural foraminal stenosis. L1-2: No spinal canal or neural foraminal stenosis. L2-3: Right asymmetric disc bulge resulting in moderate right and mild left neural foraminal narrowing. No spinal canal stenosis. Findings have progressed since prior MRI. L3-4: Left asymmetric disc bulge and mild facet degenerative changes resulting in narrowing of the left subarticular zone, mild right and moderate left neural foraminal narrowing. No significant spinal canal stenosis. Findings have progressed since prior MRI. L4-5: Shallow disc bulge and facet degenerative changes without significant spinal canal or neural foraminal stenosis. L5-S1: Facet degenerative changes. No spinal canal or neural foraminal stenosis. IMPRESSION: 1. Severely degraded study by motion artifacts with most sequences being essentially nondiagnostic, particularly in the cervical and thoracic spine. 2. No high-grade spinal canal stenosis noted throughout the cervical, thoracic and lumbar spine. 3. Moderate right and mild left neural foraminal narrowing at L2-3. 4. Mild right and moderate left neural foraminal narrowing at L3-4. 5. Assessment of neural foraminal stenosis in the cervical and thoracic spine Electronically Signed   By: Pedro Earls M.D.   On: 07/21/2021 16:15   MR THORACIC SPINE WO CONTRAST  Result Date: 07/21/2021 CLINICAL DATA:  Left-sided weakness. EXAM: MRI CERVICAL, THORACIC AND LUMBAR SPINE WITHOUT CONTRAST TECHNIQUE: Multiplanar and multiecho pulse sequences of the cervical spine, to include the craniocervical junction and cervicothoracic junction, and thoracic and lumbar spine, were obtained without intravenous contrast. COMPARISON:  MRI of the cervical spine January 03, 2019. MRI of the lumbar spine Feb 25, 2013. FINDINGS: MRI CERVICAL SPINE FINDINGS The study is severely degraded by motion. Axial images  are essentially nondiagnostic. Alignment: Straightening of the cervical curvature. Vertebrae: Postsurgical changes from C4 through C6 ACDF. No gross marrow signal abnormality or edema. Cord: Grossly unremarkable. Posterior Fossa, vertebral arteries, paraspinal tissues: Negative. Disc levels: Posterior disc osteophyte complexes are noted at C3-4, C6-7 and C7-T1 causing indentation on the thecal sac without significant spinal canal stenosis. Small endplate ridging at U3-1 also causes small indentation of the thecal sac without significant spinal canal stenosis. No significant mass effect on the cord. Assessment for neural foraminal stenosis is precluded by motion artifacts. MRI THORACIC SPINE FINDINGS The study is severely degraded by motion. Images are essentially nondiagnostic. Alignment:  Physiologic. Vertebrae: Grossly unremarkable. Cord:  No high-grade cord compression. Paraspinal and other soft tissues: Negative. Disc levels: Posterior disc protrusion at T1-2 and prominence of the ligamentum flavum on the  left Juda Lajeunesse causing indentation on the thecal sac but does not appear to cause high-grade spinal canal stenosis. No high-grade spinal canal stenosis seen throughout the thoracic spine. Evaluation of the neural foramen is precluded by motion artifacts. MRI LUMBAR SPINE FINDINGS The study is degraded by motion, particularly the axial images and sagittal STIR. Segmentation: Standard. Alignment:  Physiologic. Vertebrae:  No fracture, evidence of discitis, or bone lesion. Conus medullaris and cauda equina: Conus extends to the L1-2 level. Conus and cauda equina appear grossly unremarkable. Paraspinal and other soft tissues: Negative. Disc levels: T12-L1: No spinal canal or neural foraminal stenosis. L1-2: No spinal canal or neural foraminal stenosis. L2-3: Right asymmetric disc bulge resulting in moderate right and mild left neural foraminal narrowing. No spinal canal stenosis. Findings have progressed since prior MRI.  L3-4: Left asymmetric disc bulge and mild facet degenerative changes resulting in narrowing of the left subarticular zone, mild right and moderate left neural foraminal narrowing. No significant spinal canal stenosis. Findings have progressed since prior MRI. L4-5: Shallow disc bulge and facet degenerative changes without significant spinal canal or neural foraminal stenosis. L5-S1: Facet degenerative changes. No spinal canal or neural foraminal stenosis. IMPRESSION: 1. Severely degraded study by motion artifacts with most sequences being essentially nondiagnostic, particularly in the cervical and thoracic spine. 2. No high-grade spinal canal stenosis noted throughout the cervical, thoracic and lumbar spine. 3. Moderate right and mild left neural foraminal narrowing at L2-3. 4. Mild right and moderate left neural foraminal narrowing at L3-4. 5. Assessment of neural foraminal stenosis in the cervical and thoracic spine Electronically Signed   By: Pedro Earls M.D.   On: 07/21/2021 16:15   MR LUMBAR SPINE WO CONTRAST  Result Date: 07/21/2021 CLINICAL DATA:  Left-sided weakness. EXAM: MRI CERVICAL, THORACIC AND LUMBAR SPINE WITHOUT CONTRAST TECHNIQUE: Multiplanar and multiecho pulse sequences of the cervical spine, to include the craniocervical junction and cervicothoracic junction, and thoracic and lumbar spine, were obtained without intravenous contrast. COMPARISON:  MRI of the cervical spine January 03, 2019. MRI of the lumbar spine Feb 25, 2013. FINDINGS: MRI CERVICAL SPINE FINDINGS The study is severely degraded by motion. Axial images are essentially nondiagnostic. Alignment: Straightening of the cervical curvature. Vertebrae: Postsurgical changes from C4 through C6 ACDF. No gross marrow signal abnormality or edema. Cord: Grossly unremarkable. Posterior Fossa, vertebral arteries, paraspinal tissues: Negative. Disc levels: Posterior disc osteophyte complexes are noted at C3-4, C6-7 and C7-T1  causing indentation on the thecal sac without significant spinal canal stenosis. Small endplate ridging at Z6-0 also causes small indentation of the thecal sac without significant spinal canal stenosis. No significant mass effect on the cord. Assessment for neural foraminal stenosis is precluded by motion artifacts. MRI THORACIC SPINE FINDINGS The study is severely degraded by motion. Images are essentially nondiagnostic. Alignment:  Physiologic. Vertebrae: Grossly unremarkable. Cord:  No high-grade cord compression. Paraspinal and other soft tissues: Negative. Disc levels: Posterior disc protrusion at T1-2 and prominence of the ligamentum flavum on the left Jacqualynn Parco causing indentation on the thecal sac but does not appear to cause high-grade spinal canal stenosis. No high-grade spinal canal stenosis seen throughout the thoracic spine. Evaluation of the neural foramen is precluded by motion artifacts. MRI LUMBAR SPINE FINDINGS The study is degraded by motion, particularly the axial images and sagittal STIR. Segmentation: Standard. Alignment:  Physiologic. Vertebrae:  No fracture, evidence of discitis, or bone lesion. Conus medullaris and cauda equina: Conus extends to the L1-2 level. Conus and cauda equina appear  grossly unremarkable. Paraspinal and other soft tissues: Negative. Disc levels: T12-L1: No spinal canal or neural foraminal stenosis. L1-2: No spinal canal or neural foraminal stenosis. L2-3: Right asymmetric disc bulge resulting in moderate right and mild left neural foraminal narrowing. No spinal canal stenosis. Findings have progressed since prior MRI. L3-4: Left asymmetric disc bulge and mild facet degenerative changes resulting in narrowing of the left subarticular zone, mild right and moderate left neural foraminal narrowing. No significant spinal canal stenosis. Findings have progressed since prior MRI. L4-5: Shallow disc bulge and facet degenerative changes without significant spinal canal or neural  foraminal stenosis. L5-S1: Facet degenerative changes. No spinal canal or neural foraminal stenosis. IMPRESSION: 1. Severely degraded study by motion artifacts with most sequences being essentially nondiagnostic, particularly in the cervical and thoracic spine. 2. No high-grade spinal canal stenosis noted throughout the cervical, thoracic and lumbar spine. 3. Moderate right and mild left neural foraminal narrowing at L2-3. 4. Mild right and moderate left neural foraminal narrowing at L3-4. 5. Assessment of neural foraminal stenosis in the cervical and thoracic spine Electronically Signed   By: Pedro Earls M.D.   On: 07/21/2021 16:15        Scheduled Meds:  vitamin C  500 mg Oral Daily   aspirin EC  81 mg Oral Daily   famotidine  20 mg Oral Daily   feeding supplement  237 mL Oral BID BM   ferrous sulfate  325 mg Oral Q supper   heparin  5,000 Units Subcutaneous Q12H   levothyroxine  88 mcg Oral Q0600   loratadine  10 mg Oral Daily   multivitamin with minerals  1 tablet Oral Daily   rosuvastatin  20 mg Oral Daily   thiamine  100 mg Oral Daily   topiramate  50 mg Oral BID   traZODone  25-50 mg Oral QHS   zinc sulfate  220 mg Oral Daily   Continuous Infusions:   LOS: 14 days    Time spent: over 30 min    Fayrene Helper, MD [facility]   To contact the attending provider between 7A-7P or the covering provider during after hours 7P-7A, please log into the web site [email or web address] and access using universal [place] password for that web site. If you do not have the password, please call the hospital operator.  07/22/2021, 6:53 PM

## 2021-07-22 NOTE — Progress Notes (Signed)
PT Cancellation Note  Patient Details Name: Joanna [name] MRN: [id number] DOB: 30-May-1951   Cancelled Treatment:    Reason Eval/Treat Not Completed: Patient declined, no reason specified. Pt declines PT session at this time despite encouragement. PT will attempt to return as time allows.   Zenaida Niece 07/22/2021, 3:02 PM

## 2021-07-22 NOTE — Progress Notes (Signed)
Nutrition Follow-up  DOCUMENTATION CODES:  Severe malnutrition in context of chronic illness  INTERVENTION:  -48 hour calorie count, RD will follow-up with results on Monday, 10/10 -Continue regular diet -Continue Ensure Enlive/Plus po TID, each supplement provides 350 kcal and 20 grams of protein. -Continue Magic cup TID with meals, each supplement provides 290 kcal and 9 grams of protein. -Continue MVI with minerals daily.  If PO intake does not improve, recommend placement of Cortrak/NGT for initiation of nutrition support to help pt meet nutritional needs. Consider: Osmolite 1.2 @ 82m/hr w/ 442mProsource TF daily to provide 1912 kcals and 97 grams protein.   NUTRITION DIAGNOSIS:  Severe Malnutrition related to chronic illness as evidenced by severe fat depletion, severe muscle depletion, percent weight loss. -- ongoing  GOAL:  Patient will meet greater than or equal to 90% of their needs -- progressing   MONITOR:  PO intake, Supplement acceptance, Diet advancement, Labs, Weight trends, I & O's  REASON FOR ASSESSMENT:  Consult Assessment of nutrition requirement/status, Calorie Count  ASSESSMENT:  [age]o female with a PMH of T2DM, CKD stage 2, HTN, hypothyroidism, idiopathic left upper extremity spasm hyperplasia, chronic urinary incontinence, and recent UTI came in for increasing confusion. Patient fell, but unsure if she remained conscious or not. Admitted with AKI and AMS. Found to be COVID+ on 9/23.  PMT met with pt today to further aid in [place] in the setting of increased frailty and FTT. [facility]. Plan to continue current plan of care at this time and re-visit GOW[place]iscussions tomorrow.  PO Intake: 0-100% x last 8 meal completions (28% average meal intake).   Per RN, pt with variable supplement intake. If PO intake does not improve, recommend placement of Cortrak/NGT to supplement intake to help pt meet nutritional  needs.   Medications: vitamin C, pepcid, Ensure Enlive/Plus BID, ferrous sulfate, mvi with minerals, thiamine, zinc sulfate Labs: Cr 1.43 (H) CBGs: 134-106-124-143  Admission weight: 51.1 kg Current weight: 51.2 kg   UOP: 25064mocumented x24 hours I/O: +10.4L since admit  Diet Order:   Diet Order             Diet regular Room service appropriate? Yes; Fluid consistency: Thin  Diet effective now                  EDUCATION NEEDS:  No education needs have been identified at this time  Skin:  Skin Assessment: Reviewed RN Assessment  Last BM:  10/5  Height:  Ht Readings from Last 1 Encounters:  07/12/21 5' 2" (1.575 m)   Weight:  Wt Readings from Last 1 Encounters:  07/22/21 51.2 kg   BMI:  Body mass index is 20.65 kg/m.  Estimated Nutritional Needs:  Kcal:  1850-2050 Protein:  90-100 grams Fluid:  >1.85 L   AmaLarkin InaS, RD, LDN (she/her/hers) RD pager number and weekend/on-call pager number located in [place]

## 2021-07-22 NOTE — TOC Progression Note (Addendum)
Transition of Care [facility]) - Progression Note    Patient Details  Name: Joanna [name] MRN: [id number] Date of Birth: 1951/05/05  [facility] CM/SW Contact  Sharlet Salina Mila Homer, LCSW Phone Number: 07/22/2021, 2:55 PM  Clinical Narrative:  CSW received a secure chat regarding daughter "leaning towards inpatient hospice in the setting of failure to thrive. Daughter wants to speak to the rest of her family this morning before making a final decision".  CSW contacted daughter [name] [phone]) regarding patient and was informed that she and her siblings are doing a phone conversation with Sharyn Lull either later today or tomorrow. CSW expressed understanding.   2:48 pm: CSW received a call from [place] reminding CSW that patient's authorization expires Tuesday, 07/26/21. CSW will continue to follow and provide SW intervention services as needed or requested through discharge.  5:17 pm: Read Ms. Ferolito's note and family wants patient to d/c to [facility] via text (HIPPA compliant to confidential voice mail) and message left that family wants patient to d/c to [facility]. Will f/u with Janie on Monday.   Expected Discharge Plan: [place] Barriers to Discharge: Barriers Resolved  Expected Discharge Plan and Services Expected Discharge Plan: [place] In-house Referral: Clinical Social Work   Post Acute Care Choice: [place] Living arrangements for the past 2 months: Apartment Expected Discharge Date: 07/18/21                         HH Arranged: NA [place] Agency: NA         Social Determinants of Health (SDOH) Interventions  No SDOH interventions services needed at this time.  Readmission Risk Interventions Readmission Risk Prevention Plan 06/27/2021  Transportation Screening Complete  HRI or [place] Complete  Social Work Consult for [place] Planning/Counseling  Complete  Palliative Care Screening Not Applicable  Medication Review Press photographer) Complete  Some recent data might be hidden

## 2021-07-23 DIAGNOSIS — R4182 Altered mental status, unspecified: Secondary | ICD-10-CM | POA: Diagnosis not present

## 2021-07-23 DIAGNOSIS — Z7189 Other specified counseling: Secondary | ICD-10-CM | POA: Diagnosis not present

## 2021-07-23 DIAGNOSIS — Z515 Encounter for palliative care: Secondary | ICD-10-CM | POA: Diagnosis not present

## 2021-07-23 LAB — CBC WITH DIFFERENTIAL/PLATELET
Abs Immature Granulocytes: 0.05 10*3/uL (ref 0.00–0.07)
Basophils Absolute: 0 10*3/uL (ref 0.0–0.1)
Basophils Relative: 1 %
Eosinophils Absolute: 0.1 10*3/uL (ref 0.0–0.5)
Eosinophils Relative: 2 %
HCT: 27.9 % — ABNORMAL LOW (ref 36.0–46.0)
Hemoglobin: 8.6 g/dL — ABNORMAL LOW (ref 12.0–15.0)
Immature Granulocytes: 1 %
Lymphocytes Relative: 28 %
Lymphs Abs: 2.1 10*3/uL (ref 0.7–4.0)
MCH: 28.5 pg (ref 26.0–34.0)
MCHC: 30.8 g/dL (ref 30.0–36.0)
MCV: 92.4 fL (ref 80.0–100.0)
Monocytes Absolute: 0.4 10*3/uL (ref 0.1–1.0)
Monocytes Relative: 6 %
Neutro Abs: 4.9 10*3/uL (ref 1.7–7.7)
Neutrophils Relative %: 62 %
Platelets: 375 10*3/uL (ref 150–400)
RBC: 3.02 MIL/uL — ABNORMAL LOW (ref 3.87–5.11)
RDW: 14.5 % (ref 11.5–15.5)
WBC: 7.7 10*3/uL (ref 4.0–10.5)
nRBC: 0 % (ref 0.0–0.2)

## 2021-07-23 LAB — COMPREHENSIVE METABOLIC PANEL
ALT: 45 U/L — ABNORMAL HIGH (ref 0–44)
AST: 31 U/L (ref 15–41)
Albumin: 2.9 g/dL — ABNORMAL LOW (ref 3.5–5.0)
Alkaline Phosphatase: 64 U/L (ref 38–126)
Anion gap: 7 (ref 5–15)
BUN: 39 mg/dL — ABNORMAL HIGH (ref 8–23)
CO2: 23 mmol/L (ref 22–32)
Calcium: 10.8 mg/dL — ABNORMAL HIGH (ref 8.9–10.3)
Chloride: 110 mmol/L (ref 98–111)
Creatinine, Ser: 1.52 mg/dL — ABNORMAL HIGH (ref 0.44–1.00)
GFR, Estimated: 37 mL/min — ABNORMAL LOW (ref >60)
Glucose, Bld: 124 mg/dL — ABNORMAL HIGH (ref 70–99)
Potassium: 4.3 mmol/L (ref 3.5–5.1)
Sodium: 140 mmol/L (ref 135–145)
Total Bilirubin: 0.3 mg/dL (ref 0.3–1.2)
Total Protein: 6.4 g/dL — ABNORMAL LOW (ref 6.5–8.1)

## 2021-07-23 LAB — GLUCOSE, CAPILLARY
Glucose-Capillary: 144 mg/dL — ABNORMAL HIGH (ref 70–99)
Glucose-Capillary: 108 mg/dL — ABNORMAL HIGH (ref 70–99)
Glucose-Capillary: 256 mg/dL — ABNORMAL HIGH (ref 70–99)

## 2021-07-23 LAB — MAGNESIUM: Magnesium: 2.5 mg/dL — ABNORMAL HIGH (ref 1.7–2.4)

## 2021-07-23 LAB — PHOSPHORUS: Phosphorus: 3.5 mg/dL (ref 2.5–4.6)

## 2021-07-23 MED ORDER — SODIUM CHLORIDE 0.9 % IV SOLN: INTRAVENOUS | Status: DC

## 2021-07-23 NOTE — Plan of Care (Signed)
  Problem: Clinical Measurements: Goal: Will remain free from infection Outcome: Progressing Goal: Diagnostic test results will improve Outcome: Progressing   Problem: Nutrition: Goal: Adequate nutrition will be maintained Outcome: Progressing   Problem: Coping: Goal: Level of anxiety will decrease Outcome: Progressing

## 2021-07-23 NOTE — Progress Notes (Addendum)
PROGRESS NOTE    Joanna [name]  OIB:[id number] DOB: [birth year] DOA: 07/07/2021 PCP: [name], MD   Chief Complaint  Patient presents with   Altered Mental Status    Brief Narrative:  70 year old female with past medical history of diabetes mellitus, CKD stage II, hypertension, hypothyroidism, idiopathic left upper extremity spasm hyperplasia, chronic urinary incontinence, recent UTI was sent from the skilled nursing facility with complaints of confusion decreased oral intake.  In the ED patient was noted to have elevated blood pressure and had mild acute kidney injury with creatinine of 2.2 from baseline of 1.1.  COVID-19 was positive.  CT head scan was negative for acute finding.  Chest x-ray was unremarkable.  Ultrasound of the kidneys showed hyperechoic kidneys.  Patient was then admitted to hospital with IV fluids.  She did have fever spike during hospitalization concerning for pneumonia so Zosyn and Zithromax was initiated and speech therapy was consulted.  Patient was then admitted hospital for further evaluation and treatment. She was treated for acute metabolic encephalopathy deconditioning in the setting of Rae Sutcliffe COVID-19 infection but not hypoxic treated with remdesivir, IV fluid hydration due to AKI and CKD.  Creatinine has improved she had brief diarrhea that improved.  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  She will benefit with short-term rehab placement at this time but I suspect she will need long-term placement as she is not safe to return home and live by herself.   Assessment & Plan:   Active Problems:   AKI (acute kidney injury) ([place])   Protein-calorie malnutrition, severe    COVID  Severe Malnutrition  Poor PO Intake 9/30 RD recommended consideration of cortrak/NGT placement for nutrition support PO intake remains poor -> needs 1:1 assistance with feeding Follow calorie count May need to consider NGT/tube feeding  Goals of Care Discussion with children 10/7 over phone.  Discussed her delirium/decline with recent hospitalizations.  Multiple potential causes, covid likely contributing.  Most concerning thing is her poor oral intake.  Discussed options including focus on comfort vs tube feeding.  Family thought about this together, when they saw her again, she was more interactive.  Family is interested in tube feeding if this is necessary.  Acute metabolic encephalopathy  Microvascular ischemic changes  Left Sided Weaknss: She's had Janelie Goltz rapid decline over what sounds like the past month Living independently in July - sounds like she was close to her baseline until early Sept?  At baseline, walked without assistance and able to live independently.  Decline seems more impressive than might be expected related to deconditioning, covid, pneumonia, and chronic microvascular changes -> given abrupt decline, will ask neurology to comment Appreciate neurology recommendations - they suspect her encephalopathy is multifactorial (deconditioning, covid/pneumonia, moderately advanced chronic microvascular disease) I discussed with Dr. Maurine Minister today question of left lower extremity weakness, which was less well documented in past (note of mild lower extremity weakness in 9/24 progress note, but I don't see it well explained)  - recommended T/L/C spine MRI -> motion degraded, no high grade stenosis noted throughout cervical, thoracic, lumbar spines.  Moderate right and mild L neural foraminal narrowing at L2-3.  Mild right and moderate left neural foraminal narrowing at L3-4.    - neurology recommended outpatient folow up EMG/NCS LUE weakness is  chronic, she's following outpatient  for that MRI brain without acute intracranial abnormality, age related cerebral atrophy, moderately advanced chronic microvascular ischemic disease TSH 9/22 wnl, B12 >7500 on 9/24.  RPR non reactive.  EEG with mild diffuse encephalopathy Appreciate neurology recommendations At this time patient unable to go home and stay by herself and will need further rehabilitation PT OT may need long-term placement down the road. Cont asa, statins.   Hypercalcemia IVF PTH  Failure to thrive with poor oral intake in the setting of COVID-19 infection.  Continue PT OT, supportive care nutrition supplement. Will need to discuss PO intake prior to discussion of discharge   COVID-19 infection symptomatic with generalized weakness, not hypoxic.  Inflammatory markers were improving crp 9.7> 5.6 She's completed remdesivir   Right infrahilar airspace disease/pneumonia, bacterial with elevated procalcitonin patient completed antibiotics, seen by speech.   AKI on CKD stage IIIb baseline creatinine flcutuates widely, appears to be mostly in CKD III b range looking back  Creatinine fluctuating    Hypertension blood pressure stable HCTZ ARB has been discontinued.  Prn oral hydral ordered.  Follow BP.   Chronic left upper extremity spastic weakness/debility: Continue PT OT   Fall/near syncope: In the setting of COVID-19 infection with dehydration echo with normal EF recently 2 months ago.  Continue PT OT   Protein-calorie malnutrition, severe: Augment nutritional status at supplement  Diarrhea likely due to COVID-19.  Resolved  Hypercalcemia from dehydration resolved  Hypoglycemia-resolved, off fluids  Iron deficiency anemia hemoglobin stable continue iron supplementation  Hypothyroidism on Synthroid  HLD on Crestor  DVT prophylaxis: heparin Code Status: full  Family Communication: son, Gerald Stabs and grandson at bedside Disposition:   Status is: Inpatient  Remains inpatient appropriate  because:Inpatient level of care appropriate due to severity of illness  Dispo: The patient is from: Home              Anticipated d/c is to: SNF              Patient currently is not medically stable to d/c.   Difficult to place patient No       Consultants:  neurology  Procedures: EEG IMPRESSION: This study is suggestive of mild diffuse encephalopathy, nonspecific etiology. No seizures or epileptiform discharges were seen throughout the recording.  Antimicrobials:  Anti-infectives (From admission, onward)    Start     Dose/Rate Route Frequency Ordered Stop   07/14/21 1200  amoxicillin-clavulanate (AUGMENTIN) 500-125 MG per tablet 500 mg        1 tablet Oral Every 12 hours 07/14/21 1114 07/17/21 0959   07/10/21 1045  piperacillin-tazobactam (ZOSYN) IVPB 3.375 g  Status:  Discontinued        3.375 g 12.5 mL/hr over 240 Minutes Intravenous Every 8 hours 07/10/21 0954 07/14/21 1114   07/10/21 1000  azithromycin (ZITHROMAX) tablet 500 mg        500 mg Oral Daily 07/10/21 0842 07/12/21 1114   07/09/21 1000  remdesivir 100 mg in sodium chloride 0.9 % 100 mL IVPB       See Hyperspace for full Linked Orders Report.   100 mg 200 mL/hr over 30 Minutes Intravenous Daily 07/08/21 1040 07/10/21 1014   07/08/21 1130  remdesivir 200 mg in sodium chloride 0.9% 250 mL IVPB       See Hyperspace for full Linked Orders Report.   200 mg 580 mL/hr over 30 Minutes Intravenous Once 07/08/21 1040 07/08/21 1415  Subjective: Answers some questions, but infrequently   Objective: Vitals:   07/22/21 2112 07/23/21 0452 07/23/21 0500 07/23/21 0905  BP: (!) 134/96 (!) 128/98  107/76  Pulse: (!) 102 99  (!) 102  Resp: 18 18  16   Temp: 98.6 F (37 C) 97.8 F (36.6 C)  98 F (36.7 C)  TempSrc: Axillary Axillary    SpO2: 99% 100%  100%  Weight:   51.4 kg   Height:        Intake/Output Summary (Last 24 hours) at 07/23/2021 1709 Last data filed at 07/23/2021 0800 Gross per 24 hour   Intake 534 ml  Output 125 ml  Net 409 ml   Filed Weights   07/18/21 2052 07/22/21 0422 07/23/21 0500  Weight: 51.6 kg 51.2 kg 51.4 kg    Examination:  General: No acute distress. Cardiovascular: RRR Lungs: unlabored Abdomen: Soft, nontender, nondistended  Neurological: L sided weakness, awake, but confused - poor attention, answers some questions, but disoriented Skin: Warm and dry. No rashes or lesions. Extremities: No clubbing or cyanosis. No edema.  Data Reviewed: I have personally reviewed following labs and imaging studies  CBC: Recent Labs  Lab 07/17/21 0536 07/18/21 0116 07/21/21 0346 07/22/21 0323 07/23/21 0250  WBC 5.7 7.6 5.8 6.6 7.7  NEUTROABS 3.3  --  3.4 4.0 4.9  HGB 7.6* 8.9* 8.4* 8.8* 8.6*  HCT 23.6* 28.2* 27.0* 28.7* 27.9*  MCV 91.5 91.6 91.8 92.9 92.4  PLT 215 276 334 352 009    Basic Metabolic Panel: Recent Labs  Lab 07/17/21 0714 07/18/21 0116 07/21/21 0346 07/22/21 0323 07/23/21 0250  NA 139 139 140 139 140  K 4.1 4.0 4.2 4.3 4.3  CL 113* 114* 109 108 110  CO2 18* 20* 21* 23 23  GLUCOSE 96 108* 85 112* 124*  BUN 15 14 29* 30* 39*  CREATININE 1.28* 1.28* 1.48* 1.43* 1.52*  CALCIUM 9.8 10.0 10.2 11.0* 10.8*  MG  --   --  2.2 2.4 2.5*  PHOS  --   --  3.5 3.7 3.5    GFR: Estimated Creatinine Clearance: 27.6 mL/min (Zora Glendenning) (by C-G formula based on SCr of 1.52 mg/dL (H)).  Liver Function Tests: Recent Labs  Lab 07/21/21 0346 07/22/21 0323 07/23/21 0250  AST 68* 40 31  ALT 62* 52* 45*  ALKPHOS 69 67 64  BILITOT 0.4 0.4 0.3  PROT 6.0* 6.1* 6.4*  ALBUMIN 2.7* 2.9* 2.9*    CBG: Recent Labs  Lab 07/22/21 1114 07/22/21 1817 07/23/21 0114 07/23/21 0602 07/23/21 1138  GLUCAP 124* 143* 144* 108* 256*     No results found for this or any previous visit (from the past 240 hour(s)).       Radiology Studies: No results found.      Scheduled Meds:  vitamin C  500 mg Oral Daily   aspirin EC  81 mg Oral Daily    famotidine  20 mg Oral Daily   feeding supplement  237 mL Oral TID BM   ferrous sulfate  325 mg Oral Q supper   heparin  5,000 Units Subcutaneous Q12H   levothyroxine  88 mcg Oral Q0600   loratadine  10 mg Oral Daily   multivitamin with minerals  1 tablet Oral Daily   rosuvastatin  20 mg Oral Daily   thiamine  100 mg Oral Daily   topiramate  50 mg Oral BID   traZODone  25-50 mg Oral QHS   zinc sulfate  220 mg Oral Daily  Continuous Infusions:   LOS: 15 days    Time spent: over 30 min    Fayrene Helper, MD [facility]   To contact the attending provider between 7A-7P or the covering provider during after hours 7P-7A, please log into the web site [email or web address] and access using universal [place] password for that web site. If you do not have the password, please call the hospital operator.  07/23/2021, 5:09 PM

## 2021-07-23 NOTE — Progress Notes (Addendum)
Palliative Medicine Inpatient Follow Up Note  Consulting Provider: Elodia Florence., MD   Reason for consult:   [place] Palliative Medicine Consult  Reason for Consult? goc    HPI:  Per intake H&P --> 70 year old female with past medical history of diabetes mellitus, CKD stage II, hypertension, hypothyroidism, idiopathic left upper extremity spasm hyperplasia, chronic urinary incontinence, recent UTI was sent from the skilled nursing facility with complaints of confusion decreased oral intake.  Admitted and treated for acute metabolic encephalopathy deconditioning in the setting of a COVID-19 infection but not hypoxic treated with remdesivir, IV fluid hydration due to AKI and CKD.  Creatinine has improved she had brief diarrhea that improved.  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  She will benefit with short-term rehab placement at this time but I suspect she will need long-term placement as she is not safe to return home and live by herself.   Palliative care has been asked to get involved to further aid in goals of care conversations in the setting of increased frailty and FTT. Patient will [facility] to [facility] once medically optimized.    Palliative care will offer an introduction of concepts related to care.   Today's Discussion (07/23/2021):  *Please note that this is a verbal dictation therefore any spelling or grammatical errors are due to the "[place]" system interpretation.  Chart reviewed.  I met this morning with Arayah, she was more alert this morning and able to tell me who she was, where she was, and that she was hospitalized due to Covid.  She shared with me the hope for improvements. I asked her if she was feeling hungry this morning and she shared that she was. I set up her breakfast for her and started feeding her. She tolerated this well.  I was able to afterwards speak to the nursing technician to continue feeding [place]. I suspect if she is prompted to eat and has 1:1 feeding she may be more successful than set up to self feed.   Patients family would like for patient to make improvements. They share that the patient herself attests to not being "ready to give up".  Questions and concerns addressed   Objective Assessment: Vital Signs Vitals:   07/22/21 2112 07/23/21 0452  BP: (!) 134/96 (!) 128/98  Pulse: (!) 102 99  Resp: 18 18  Temp: 98.6 F (37 C) 97.8 F (36.6 C)  SpO2: 99% 100%    Intake/Output Summary (Last 24 hours) at 07/23/2021 1884 Last data filed at 07/23/2021 1660 Gross per 24 hour  Intake 694 ml  Output 125 ml  Net 569 ml   Last Weight  Most recent update: 07/23/2021  6:12 AM    Weight  51.4 kg (113 lb 5.1 oz)            Gen: Frail elderly African-American female in no acute distress HEENT: moist mucous membranes CV: Irregular rate and rhythm  PULM: clear to auscultation bilaterally ABD: soft/nontender EXT: No edema Neuro: A&O x2 this morning  SUMMARY OF RECOMMENDATIONS   DNAR/DNI   Continue current measures of care   Goals: For improvements and muscular strengthening   Ongoing palliative care support    Symptoms: Failure to thrive in an Adult, in the setting  of post-COVID infection: - 1:1 Feeding - Appreciate dietician for 24 hours calorie count - Supplemental nutrition TID - Patient and family are open to artificial nutrition if it evolves into that  Muscular Weakness: - Physical Therapy - Occupational Therapy - Nursing to get OOB daily - Will need rehabilitation post-hospitalization  Delirium: -Lights and TV off, minimize interruptions at night -Blinds open and lights  on during day -Glasses/hearing aid with patient -Get up during the day -Encourage a familiar face to remain present throughout the day -Frequent reorientation  Time Spent: 37 Greater than 50% of the time was spent in counseling and coordination of care ______________________________________________________________________________________ [place] Team Team Cell Phone: [phone] Please utilize secure chat with additional questions, if there is no response within 30 minutes please call the above phone number  Palliative Medicine Team providers are available by phone from 7am to 7pm daily and can be reached through the team cell phone.  Should this patient require assistance outside of these hours, please call the patient's attending physician.

## 2021-07-24 DIAGNOSIS — Z515 Encounter for palliative care: Secondary | ICD-10-CM | POA: Diagnosis not present

## 2021-07-24 DIAGNOSIS — R4182 Altered mental status, unspecified: Secondary | ICD-10-CM | POA: Diagnosis not present

## 2021-07-24 DIAGNOSIS — Z7189 Other specified counseling: Secondary | ICD-10-CM | POA: Diagnosis not present

## 2021-07-24 LAB — BASIC METABOLIC PANEL
Anion gap: 6 (ref 5–15)
BUN: 45 mg/dL — ABNORMAL HIGH (ref 8–23)
CO2: 22 mmol/L (ref 22–32)
Calcium: 10.3 mg/dL (ref 8.9–10.3)
Chloride: 111 mmol/L (ref 98–111)
Creatinine, Ser: 1.43 mg/dL — ABNORMAL HIGH (ref 0.44–1.00)
GFR, Estimated: 40 mL/min — ABNORMAL LOW (ref >60)
Glucose, Bld: 116 mg/dL — ABNORMAL HIGH (ref 70–99)
Potassium: 4.3 mmol/L (ref 3.5–5.1)
Sodium: 139 mmol/L (ref 135–145)

## 2021-07-24 LAB — GLUCOSE, CAPILLARY
Glucose-Capillary: 118 mg/dL — ABNORMAL HIGH (ref 70–99)
Glucose-Capillary: 132 mg/dL — ABNORMAL HIGH (ref 70–99)
Glucose-Capillary: 139 mg/dL — ABNORMAL HIGH (ref 70–99)
Glucose-Capillary: 97 mg/dL (ref 70–99)

## 2021-07-24 NOTE — Progress Notes (Signed)
PROGRESS NOTE    Joanna [name]  VOZ:[id number] DOB: 1951/07/02 DOA: 07/07/2021 PCP: [name], MD   Chief Complaint  Patient presents with   Altered Mental Status    Brief Narrative:  70 year old female with past medical history of diabetes mellitus, CKD stage II, hypertension, hypothyroidism, idiopathic left upper extremity spasm hyperplasia, chronic urinary incontinence, recent UTI was sent from the skilled nursing facility with complaints of confusion decreased oral intake.  In the ED patient was noted to have elevated blood pressure and had mild acute kidney injury with creatinine of 2.2 from baseline of 1.1.  COVID-19 was positive.  CT head scan was negative for acute finding.  Chest x-ray was unremarkable.  Ultrasound of the kidneys showed hyperechoic kidneys.  Patient was then admitted to hospital with IV fluids.  She did have fever spike during hospitalization concerning for pneumonia so Zosyn and Zithromax was initiated and speech therapy was consulted.  Patient was then admitted hospital for further evaluation and treatment. She was treated for acute metabolic encephalopathy deconditioning in the setting of Lanna Labella COVID-19 infection but not hypoxic treated with remdesivir, IV fluid hydration due to AKI and CKD.  Creatinine has improved she had brief diarrhea that improved.  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  She will benefit with short-term rehab placement at this time but I suspect she will need long-term placement as she is not safe to return home and live by herself.   Assessment & Plan:   Active Problems:   AKI (acute kidney injury) ([place])   Protein-calorie malnutrition, severe    COVID  Severe Malnutrition  Poor PO Intake 9/30 RD recommended consideration of cortrak/NGT placement for nutrition support PO intake remains poor -> needs 1:1 assistance with feeding Follow calorie count - pending May need to consider NGT/tube feeding  Goals of Care Discussion with children 10/7 over phone.  Discussed her delirium/decline with recent hospitalizations.  Multiple potential causes, covid likely contributing.  Most concerning thing is her poor oral intake.  Discussed options including focus on comfort vs tube feeding.  Family thought about this together, when they saw her again, she was more interactive.  Family is interested in tube feeding if this is necessary.  Acute metabolic encephalopathy  Microvascular ischemic changes  Left Sided Weaknss: She's had Sukanya Goldblatt rapid decline over what sounds like the past month Living independently in July - sounds like she was close to her baseline until early Sept?  At baseline, walked without assistance and able to live independently.  Decline seems more impressive than might be expected related to deconditioning, covid, pneumonia, and chronic microvascular changes -> given abrupt decline, will ask neurology to comment Appreciate neurology recommendations - they suspect her encephalopathy is multifactorial (deconditioning, covid/pneumonia, moderately advanced chronic microvascular disease) I discussed with Dr. Maurine Minister today question of left lower extremity weakness, which was less well documented in past (note of mild lower extremity weakness in 9/24 progress note, but I don't see it well explained)  - recommended T/L/C spine MRI -> motion degraded, no high grade stenosis noted throughout cervical, thoracic, lumbar spines.  Moderate right and mild L neural foraminal narrowing at L2-3.  Mild right and moderate left neural foraminal narrowing at L3-4.    - neurology recommended outpatient folow up EMG/NCS LUE  weakness is chronic, she's following  outpatient for that MRI brain without acute intracranial abnormality, age related cerebral atrophy, moderately advanced chronic microvascular ischemic disease TSH 9/22 wnl, B12 >7500 on 9/24.  RPR non reactive.  EEG with mild diffuse encephalopathy Appreciate neurology recommendations At this time patient unable to go home and stay by herself and will need further rehabilitation PT OT may need long-term placement down the road. Cont asa, statins.   Hypercalcemia IVF PTH pending  Failure to thrive with poor oral intake in the setting of COVID-19 infection.  Continue PT OT, supportive care nutrition supplement. Will need to discuss PO intake prior to discussion of discharge   COVID-19 infection symptomatic with generalized weakness, not hypoxic.  Inflammatory markers were improving crp 9.7> 5.6 She's completed remdesivir   Right infrahilar airspace disease/pneumonia, bacterial with elevated procalcitonin patient completed antibiotics, seen by speech.   AKI on CKD stage IIIb baseline creatinine flcutuates widely, appears to be mostly in CKD III b range looking back  Creatinine fluctuating    Hypertension blood pressure stable HCTZ ARB has been discontinued.  Prn oral hydral ordered.  Follow BP.   Chronic left upper extremity spastic weakness/debility: Continue PT OT   Fall/near syncope: In the setting of COVID-19 infection with dehydration echo with normal EF recently 2 months ago.  Continue PT OT   Protein-calorie malnutrition, severe: Augment nutritional status at supplement  Diarrhea likely due to COVID-19.  Resolved  Hypercalcemia from dehydration resolved  Hypoglycemia-resolved, off fluids  Iron deficiency anemia hemoglobin stable continue iron supplementation  Hypothyroidism on Synthroid  HLD on Crestor  DVT prophylaxis: heparin Code Status: full  Family Communication: son, Gerald Stabs and grandson at bedside Disposition:   Status is: Inpatient  Remains inpatient  appropriate because:Inpatient level of care appropriate due to severity of illness  Dispo: The patient is from: Home              Anticipated d/c is to: SNF              Patient currently is not medically stable to d/c.   Difficult to place patient No       Consultants:  neurology  Procedures: EEG IMPRESSION: This study is suggestive of mild diffuse encephalopathy, nonspecific etiology. No seizures or epileptiform discharges were seen throughout the recording.  Antimicrobials:  Anti-infectives (From admission, onward)    Start     Dose/Rate Route Frequency Ordered Stop   07/14/21 1200  amoxicillin-clavulanate (AUGMENTIN) 500-125 MG per tablet 500 mg        1 tablet Oral Every 12 hours 07/14/21 1114 07/17/21 0959   07/10/21 1045  piperacillin-tazobactam (ZOSYN) IVPB 3.375 g  Status:  Discontinued        3.375 g 12.5 mL/hr over 240 Minutes Intravenous Every 8 hours 07/10/21 0954 07/14/21 1114   07/10/21 1000  azithromycin (ZITHROMAX) tablet 500 mg        500 mg Oral Daily 07/10/21 0842 07/12/21 1114   07/09/21 1000  remdesivir 100 mg in sodium chloride 0.9 % 100 mL IVPB       See Hyperspace for full Linked Orders Report.   100 mg 200 mL/hr over 30 Minutes Intravenous Daily 07/08/21 1040 07/10/21 1014   07/08/21 1130  remdesivir 200 mg in sodium chloride 0.9% 250 mL IVPB       See Hyperspace for full Linked Orders Report.   200 mg 580 mL/hr over 30 Minutes Intravenous Once 07/08/21 1040 07/08/21 1415  Subjective: Answers some questions Confused, knows hospital   Objective: Vitals:   07/24/21 0023 07/24/21 0500 07/24/21 0521 07/24/21 0931  BP: (!) 115/91  116/80 (!) 146/75  Pulse: (!) 102  86 94  Resp: 18  16 15   Temp: 98 F (36.7 C)  97.7 F (36.5 C) 97.7 F (36.5 C)  TempSrc: Oral  Oral   SpO2: 100%  100% 100%  Weight:  50.6 kg    Height:        Intake/Output Summary (Last 24 hours) at 07/24/2021 1638 Last data filed at 07/24/2021 0800 Gross per  24 hour  Intake 690.33 ml  Output 150 ml  Net 540.33 ml   Filed Weights   07/22/21 0422 07/23/21 0500 07/24/21 0500  Weight: 51.2 kg 51.4 kg 50.6 kg    Examination:  General: No acute distress. Cardiovascular:RRR Lungs: unlabored Abdomen: Soft, nontender, nondistended  Neurological: knew hospital, but otherwise confused, L sided weakness Extremities: No clubbing or cyanosis. No edema.   Data Reviewed: I have personally reviewed following labs and imaging studies  CBC: Recent Labs  Lab 07/18/21 0116 07/21/21 0346 07/22/21 0323 07/23/21 0250  WBC 7.6 5.8 6.6 7.7  NEUTROABS  --  3.4 4.0 4.9  HGB 8.9* 8.4* 8.8* 8.6*  HCT 28.2* 27.0* 28.7* 27.9*  MCV 91.6 91.8 92.9 92.4  PLT 276 334 352 834    Basic Metabolic Panel: Recent Labs  Lab 07/18/21 0116 07/21/21 0346 07/22/21 0323 07/23/21 0250 07/24/21 0849  NA 139 140 139 140 139  K 4.0 4.2 4.3 4.3 4.3  CL 114* 109 108 110 111  CO2 20* 21* 23 23 22   GLUCOSE 108* 85 112* 124* 116*  BUN 14 29* 30* 39* 45*  CREATININE 1.28* 1.48* 1.43* 1.52* 1.43*  CALCIUM 10.0 10.2 11.0* 10.8* 10.3  MG  --  2.2 2.4 2.5*  --   PHOS  --  3.5 3.7 3.5  --     GFR: Estimated Creatinine Clearance: 29.4 mL/min (Roniqua Kintz) (by C-G formula based on SCr of 1.43 mg/dL (H)).  Liver Function Tests: Recent Labs  Lab 07/21/21 0346 07/22/21 0323 07/23/21 0250  AST 68* 40 31  ALT 62* 52* 45*  ALKPHOS 69 67 64  BILITOT 0.4 0.4 0.3  PROT 6.0* 6.1* 6.4*  ALBUMIN 2.7* 2.9* 2.9*    CBG: Recent Labs  Lab 07/23/21 0602 07/23/21 1138 07/24/21 0025 07/24/21 0623 07/24/21 1134  GLUCAP 108* 256* 97 118* 132*     No results found for this or any previous visit (from the past 240 hour(s)).       Radiology Studies: No results found.      Scheduled Meds:  vitamin C  500 mg Oral Daily   aspirin EC  81 mg Oral Daily   famotidine  20 mg Oral Daily   feeding supplement  237 mL Oral TID BM   ferrous sulfate  325 mg Oral Q supper    heparin  5,000 Units Subcutaneous Q12H   levothyroxine  88 mcg Oral Q0600   loratadine  10 mg Oral Daily   multivitamin with minerals  1 tablet Oral Daily   rosuvastatin  20 mg Oral Daily   thiamine  100 mg Oral Daily   topiramate  50 mg Oral BID   traZODone  25-50 mg Oral QHS   zinc sulfate  220 mg Oral Daily   Continuous Infusions:  sodium chloride 75 mL/hr at 07/24/21 1050     LOS: 16 days  Time spent: over 30 min    Fayrene Helper, MD [facility]   To contact the attending provider between 7A-7P or the covering provider during after hours 7P-7A, please log into the web site [email or web address] and access using universal [place] password for that web site. If you do not have the password, please call the hospital operator.  07/24/2021, 4:38 PM

## 2021-07-24 NOTE — Progress Notes (Signed)
Palliative Medicine Inpatient Follow Up Note  Consulting Provider: Elodia Florence., MD   Reason for consult:   [place] Palliative Medicine Consult  Reason for Consult? goc    HPI:  Per intake H&P --> 70 year old female with past medical history of diabetes mellitus, CKD stage II, hypertension, hypothyroidism, idiopathic left upper extremity spasm hyperplasia, chronic urinary incontinence, recent UTI was sent from the skilled nursing facility with complaints of confusion decreased oral intake.  Admitted and treated for acute metabolic encephalopathy deconditioning in the setting of a COVID-19 infection but not hypoxic treated with remdesivir, IV fluid hydration due to AKI and CKD.  Creatinine has improved she had brief diarrhea that improved.  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  She will benefit with short-term rehab placement at this time but I suspect she will need long-term placement as she is not safe to return home and live by herself.   Palliative care has been asked to get involved to further aid in goals of care conversations in the setting of increased frailty and FTT. Patient will discharge to [facility] once medically optimized.    Palliative care will offer an introduction of concepts related to care.   Today's Discussion (07/24/2021):  *Please note that this is a verbal dictation therefore any spelling or grammatical errors are due to the "[place] [facility]" system interpretation.  Chart reviewed.  I met with patients bedside RN this morning. He shares that Joanna [name] drank an ensure last night and one this morning. He endorses some episodes of  restlessness.   Upon assessment this morning, Joanna [name] was in bed noted to be in no distress. She was comfortable and able to answer questions appropriately. We set the goals together for the day of getting out of bed and trying to eat as tolerated.   Joanna [name] was also noted to have a more contracted appearing left hand, she allowed me to place a rolled up washcloth in her palm. We reviewed the importance of ongoing exercises of that hand to preclude long term contractures.   There was no family present at bedside during my exam this morning.   Questions and concerns addressed   Objective Assessment: Vital Signs Vitals:   07/24/21 0023 07/24/21 0521  BP: (!) 115/91 116/80  Pulse: (!) 102 86  Resp: 18 16  Temp: 98 F (36.7 C) 97.7 F (36.5 C)  SpO2: 100% 100%    Intake/Output Summary (Last 24 hours) at 07/24/2021 0737 Last data filed at 07/24/2021 0300 Gross per 24 hour  Intake 630.33 ml  Output 150 ml  Net 480.33 ml    Last Weight  Most recent update: 07/24/2021  5:47 AM    Weight  50.6 kg (111 lb 8.8 oz)            Gen: Frail elderly African-American female in no acute distress HEENT: moist mucous membranes CV: Irregular rate and rhythm  PULM: clear to auscultation bilaterally ABD: soft/nontender EXT: No edema Neuro: A&O x2 this morning  SUMMARY OF RECOMMENDATIONS   DNAR/DNI, family is open to artificial nutrition as they feel this is consistent with patient wishes   Continue current measures of care   Goals: For improvements and muscular strengthening   Ongoing palliative care support    Symptoms:  Failure to thrive in an Adult, in the setting of post-COVID infection: - 1:1 Feeding - Appreciate Dietician for 48 hour calorie count - Supplemental nutrition TID - Patient and family are open to artificial nutrition if it evolves into that  Muscular Weakness: - Physical Therapy - Occupational Therapy - Nursing to get OOB daily - Will need rehabilitation  post-hospitalization  L Hand contracture: -Appreciate OT involvement -ROM exercises daily  Delirium: -Lights and TV off, minimize interruptions at night -Blinds open and lights on during day -Glasses/hearing aid with patient -Get up during the day -Encourage a familiar face to remain present throughout the day -Frequent reorientation  Time Spent: 25 Greater than 50% of the time was spent in counseling and coordination of care ______________________________________________________________________________________ [facility] Team Team Cell Phone: [phone] Please utilize secure chat with additional questions, if there is no response within 30 minutes please call the above phone number  Palliative Medicine Team providers are available by phone from 7am to 7pm daily and can be reached through the team cell phone.  Should this patient require assistance outside of these hours, please call the patient's attending physician.

## 2021-07-24 NOTE — Plan of Care (Signed)
  Problem: Clinical Measurements: Goal: Ability to maintain clinical measurements within normal limits will improve Outcome: Progressing   Problem: Coping: Goal: Level of anxiety will decrease Outcome: Progressing   Problem: Elimination: Goal: Will not experience complications related to bowel motility Outcome: Progressing

## 2021-07-24 NOTE — Progress Notes (Signed)
Nt tried to feed breakfast and patient only ate couple bites.  I offered bites at lunch and ate 1/2 banana pudding after numerous attempts with other foods.

## 2021-07-25 DIAGNOSIS — G8114 Spastic hemiplegia affecting left nondominant side: Secondary | ICD-10-CM | POA: Diagnosis not present

## 2021-07-25 DIAGNOSIS — E1122 Type 2 diabetes mellitus with diabetic chronic kidney disease: Secondary | ICD-10-CM | POA: Diagnosis not present

## 2021-07-25 DIAGNOSIS — N179 Acute kidney failure, unspecified: Secondary | ICD-10-CM | POA: Diagnosis not present

## 2021-07-25 DIAGNOSIS — D649 Anemia, unspecified: Secondary | ICD-10-CM | POA: Diagnosis not present

## 2021-07-25 DIAGNOSIS — R627 Adult failure to thrive: Secondary | ICD-10-CM | POA: Diagnosis not present

## 2021-07-25 DIAGNOSIS — R531 Weakness: Secondary | ICD-10-CM | POA: Diagnosis not present

## 2021-07-25 DIAGNOSIS — G9341 Metabolic encephalopathy: Secondary | ICD-10-CM | POA: Diagnosis not present

## 2021-07-25 DIAGNOSIS — N184 Chronic kidney disease, stage 4 (severe): Secondary | ICD-10-CM | POA: Diagnosis not present

## 2021-07-25 DIAGNOSIS — R519 Headache, unspecified: Secondary | ICD-10-CM | POA: Diagnosis not present

## 2021-07-25 DIAGNOSIS — N183 Chronic kidney disease, stage 3 unspecified: Secondary | ICD-10-CM | POA: Diagnosis not present

## 2021-07-25 DIAGNOSIS — E42 Marasmic kwashiorkor: Secondary | ICD-10-CM | POA: Diagnosis not present

## 2021-07-25 DIAGNOSIS — E1165 Type 2 diabetes mellitus with hyperglycemia: Secondary | ICD-10-CM | POA: Diagnosis not present

## 2021-07-25 DIAGNOSIS — R262 Difficulty in walking, not elsewhere classified: Secondary | ICD-10-CM | POA: Diagnosis not present

## 2021-07-25 DIAGNOSIS — I10 Essential (primary) hypertension: Secondary | ICD-10-CM | POA: Diagnosis not present

## 2021-07-25 DIAGNOSIS — U071 COVID-19: Secondary | ICD-10-CM | POA: Diagnosis not present

## 2021-07-25 DIAGNOSIS — N390 Urinary tract infection, site not specified: Secondary | ICD-10-CM | POA: Diagnosis not present

## 2021-07-25 DIAGNOSIS — Z741 Need for assistance with personal care: Secondary | ICD-10-CM | POA: Diagnosis not present

## 2021-07-25 DIAGNOSIS — R279 Unspecified lack of coordination: Secondary | ICD-10-CM | POA: Diagnosis not present

## 2021-07-25 DIAGNOSIS — R2681 Unsteadiness on feet: Secondary | ICD-10-CM | POA: Diagnosis not present

## 2021-07-25 DIAGNOSIS — Z743 Need for continuous supervision: Secondary | ICD-10-CM | POA: Diagnosis not present

## 2021-07-25 DIAGNOSIS — R4182 Altered mental status, unspecified: Secondary | ICD-10-CM | POA: Diagnosis not present

## 2021-07-25 DIAGNOSIS — E039 Hypothyroidism, unspecified: Secondary | ICD-10-CM | POA: Diagnosis not present

## 2021-07-25 DIAGNOSIS — R41841 Cognitive communication deficit: Secondary | ICD-10-CM | POA: Diagnosis not present

## 2021-07-25 DIAGNOSIS — G8194 Hemiplegia, unspecified affecting left nondominant side: Secondary | ICD-10-CM | POA: Diagnosis not present

## 2021-07-25 DIAGNOSIS — K219 Gastro-esophageal reflux disease without esophagitis: Secondary | ICD-10-CM | POA: Diagnosis not present

## 2021-07-25 DIAGNOSIS — M6281 Muscle weakness (generalized): Secondary | ICD-10-CM | POA: Diagnosis not present

## 2021-07-25 DIAGNOSIS — E119 Type 2 diabetes mellitus without complications: Secondary | ICD-10-CM | POA: Diagnosis not present

## 2021-07-25 DIAGNOSIS — R1312 Dysphagia, oropharyngeal phase: Secondary | ICD-10-CM | POA: Diagnosis not present

## 2021-07-25 LAB — GLUCOSE, CAPILLARY
Glucose-Capillary: 139 mg/dL — ABNORMAL HIGH (ref 70–99)
Glucose-Capillary: 97 mg/dL (ref 70–99)

## 2021-07-25 LAB — CBC
HCT: 24.4 % — ABNORMAL LOW (ref 36.0–46.0)
Hemoglobin: 7.5 g/dL — ABNORMAL LOW (ref 12.0–15.0)
MCH: 29.4 pg (ref 26.0–34.0)
MCHC: 30.7 g/dL (ref 30.0–36.0)
MCV: 95.7 fL (ref 80.0–100.0)
Platelets: 312 10*3/uL (ref 150–400)
RBC: 2.55 MIL/uL — ABNORMAL LOW (ref 3.87–5.11)
RDW: 14.6 % (ref 11.5–15.5)
WBC: 10.2 10*3/uL (ref 4.0–10.5)
nRBC: 0 % (ref 0.0–0.2)

## 2021-07-25 LAB — MAGNESIUM: Magnesium: 2 mg/dL (ref 1.7–2.4)

## 2021-07-25 LAB — BASIC METABOLIC PANEL WITH GFR
Anion gap: 7 (ref 5–15)
BUN: 43 mg/dL — ABNORMAL HIGH (ref 8–23)
CO2: 18 mmol/L — ABNORMAL LOW (ref 22–32)
Calcium: 10 mg/dL (ref 8.9–10.3)
Chloride: 113 mmol/L — ABNORMAL HIGH (ref 98–111)
Creatinine, Ser: 1.36 mg/dL — ABNORMAL HIGH (ref 0.44–1.00)
GFR, Estimated: 42 mL/min — ABNORMAL LOW (ref >60)
Glucose, Bld: 102 mg/dL — ABNORMAL HIGH (ref 70–99)
Potassium: 4.5 mmol/L (ref 3.5–5.1)
Sodium: 138 mmol/L (ref 135–145)

## 2021-07-25 LAB — PHOSPHORUS: Phosphorus: 2.7 mg/dL (ref 2.5–4.6)

## 2021-07-25 LAB — HEMOGLOBIN AND HEMATOCRIT, BLOOD
HCT: 24.7 % — ABNORMAL LOW (ref 36.0–46.0)
Hemoglobin: 7.5 g/dL — ABNORMAL LOW (ref 12.0–15.0)

## 2021-07-25 LAB — PTH, INTACT AND CALCIUM
Calcium, Total (PTH): 10.9 mg/dL — ABNORMAL HIGH (ref 8.7–10.3)
PTH: 39 pg/mL (ref 15–65)

## 2021-07-25 MED ORDER — ENSURE PLUS HIGH PROTEIN PO LIQD: 237.0000 mL | Freq: Three times a day (TID) | ORAL | Status: AC

## 2021-07-25 MED ORDER — ADULT MULTIVITAMIN W/MINERALS CH: 1.0000 | ORAL_TABLET | Freq: Every day | ORAL | Status: DC

## 2021-07-25 NOTE — Discharge Summary (Signed)
Physician Discharge Summary  Joanna [name] QTM:[id number] DOB: [birth year] DOA: 07/07/2021  PCP: [name], MD  Admit date: 07/07/2021 Discharge date: 07/25/2021  Time spent: 40 minutes  Recommendations for Outpatient Follow-up:  Follow outpatient CBC/CMP Continue ensure plus high protein TID and magic cup TID as well as multivitamins Recommend palliative care to follow outpatient for goals of care Recommend neurology to follow outpatient for cognitive deficits/delirium and left sided weakness - consider EMG/NCS for LE weakness Hypercalcemia, follow outpatient for further workup  Outpatient CXR    Discharge Diagnoses:  Active Problems:   AKI (acute kidney injury) ([place])   Protein-calorie malnutrition, severe   COVID   Discharge Condition: stable  Diet recommendation: heart healthy  Filed Weights   07/24/21 0500 07/24/21 2109 07/25/21 0533  Weight: 50.6 kg 51 kg 51.3 kg    History of present illness:  70 year old female with past medical history of diabetes mellitus, CKD stage II, hypertension, hypothyroidism, idiopathic left upper extremity spasm hyperplasia, chronic urinary incontinence, recent UTI was sent from the skilled nursing facility with complaints of confusion decreased oral intake.  In the ED patient was noted to have elevated blood pressure and had mild acute kidney injury with creatinine of 2.2 from baseline of 1.1.  COVID-19 was positive.  CT head scan was negative for acute finding.  Chest x-ray was unremarkable.  Ultrasound of the kidneys showed hyperechoic kidneys.  Patient was then admitted to hospital with IV fluids.  She did have fever spike during hospitalization concerning for pneumonia so Zosyn and Zithromax was initiated and speech therapy was consulted.  Patient was then admitted hospital for further evaluation and treatment.  She was treated for acute metabolic encephalopathy deconditioning in the setting of Lysle Yero COVID-19 infection.  She was treated with  remdesivir, IVF.   She remains weak frail deconditioned, suspect her encephalopathy/weakness is multifactorial  2/2 deconditioning, COVID infection, pneumonia, and largely from her small vessel disease with multiple chronic microvascular ischemic events as evident on MRI showing "cerebral atrophy with moderately advanced chronic microvascular ischemic disease ".   She was seen by neurology who agreed her encephalopathy likely multifactorial and recommended delirium precautions.  Her spine was imaged as well, but poor quality, outpatient neurology follow up recommended.  Hospitalization complicated by poor PO intake, she had calorie count.  Plan is to continue supplementation with ensures and magic cups.  Goals of care conversations with family, palliative care will need to follow outpatient.  She will benefit with short-term rehab placement at this time but I suspect she will need long-term placement as she is not safe to return home and live by herself.  Hospital Course:  Goals of Care Discussion with children 10/7 over phone.  Discussed her delirium/decline with recent hospitalizations.  Multiple potential causes, covid likely contributing.  Most concerning thing is her poor oral intake.  Discussed options including focus on comfort vs tube feeding.  Family thought about this together, when they saw her again, she was more interactive.  Family is interested in tube feeding if this is necessary.  Did well with supplements (ensure) during calorie count.   Will need to follow outpatient with palliative  Severe Malnutrition  Poor PO Intake 9/30 RD recommended consideration of cortrak/NGT placement for nutrition support PO intake remains poor -> needs 1:1 assistance with feeding Met 87% of needs - continue ensure plus high protein TID and magic cup TID and MVI with minerals    Acute metabolic encephalopathy  Microvascular ischemic changes  Left Sided Weaknss: She's had Joanna [name] rapid decline over what sounds  like the past month Living independently in July - sounds like she was close to her baseline until early Sept?  At baseline, walked without assistance and able to live independently.  Decline seems more impressive than might be expected related to deconditioning, covid, pneumonia, and chronic microvascular changes -> given abrupt decline, will ask neurology to comment Appreciate neurology recommendations - they suspect her encephalopathy is multifactorial (deconditioning, covid/pneumonia, moderately advanced chronic microvascular disease) I discussed with Dr. Maurine Minister with question of left lower extremity weakness, which was less well documented in past (note of mild lower extremity weakness in 9/24 progress note, but I don't see it well explained)             - recommended T/L/C spine MRI -> motion degraded, no high grade stenosis noted throughout cervical, thoracic, lumbar spines.  Moderate right and mild L neural foraminal narrowing at L2-3.  Mild right and moderate left neural foraminal narrowing at L3-4.               - neurology recommended outpatient follow up EMG/NCS LUE weakness is chronic, she's following outpatient for that MRI brain without acute intracranial abnormality, age related cerebral atrophy, moderately advanced chronic microvascular ischemic disease TSH 9/22 wnl, B12 >7500 on 9/24.  RPR non reactive.  EEG with mild diffuse encephalopathy Appreciate neurology recommendations At this time patient unable to go home and stay by herself and will need further rehabilitation PT OT may need long-term placement down the road. Cont asa, statins.   Hypercalcemia IVF PTH suggests non parathyroid hypercalcemia   Failure to thrive with poor oral intake in the setting of COVID-19 infection.  Continue PT OT, supportive care nutrition supplement. Continue goals of care discussions   COVID-19 infection symptomatic with generalized weakness, not hypoxic.  Inflammatory markers were improving  crp 9.7> 5.6 She's completed remdesivir   Right infrahilar airspace disease/pneumonia, bacterial with elevated procalcitonin patient completed antibiotics, seen by speech.   AKI on CKD stage IIIb baseline creatinine flcutuates widely, appears to be mostly in CKD III b range looking back  Creatinine fluctuating    Hypertension blood pressure stable HCTZ ARB has been discontinued.  Prn oral hydral ordered.  Follow BP. Follow BP outpatient    Chronic left upper extremity spastic weakness/debility: Continue PT OT   Fall/near syncope: In the setting of COVID-19 infection with dehydration echo with normal EF recently 2 months ago.  Continue PT OT   Protein-calorie malnutrition, severe: Augment nutritional status at supplement   Diarrhea likely due to COVID-19.  Resolved   Hypercalcemia from dehydration resolved   Hypoglycemia-resolved, off fluids   Iron deficiency anemia hemoglobin stable continue iron supplementation   Hypothyroidism on Synthroid   HLD on Crestor  Procedures: none  Consultations: Palliative neurology  Discharge Exam: Vitals:   07/25/21 0419 07/25/21 0802  BP: 123/80 127/82  Pulse: 97 99  Resp: 18 20  Temp: 98.4 F (36.9 C) 98.3 F (36.8 C)  SpO2: 96% 100%   Less interactive today Shakes her head no when asked if she needs anything Discussed d/c plan with daughter  General: No acute distress. Cardiovascular: RRR Lungs: unlabored Abdomen: Soft, nontender, nondistended Neurological: minimally interactive today, L sided weakness  Extremities: No clubbing or cyanosis. No edema.  Discharge Instructions   Discharge Instructions     Ambulatory referral to Neurology   Complete by: As directed    An appointment is requested in approximately:  2 weeks   Call MD for:  difficulty breathing, headache or visual disturbances   Complete by: As directed    Call MD for:  extreme fatigue   Complete by: As directed    Call MD for:  hives   Complete by: As  directed    Call MD for:  persistant dizziness or light-headedness   Complete by: As directed    Call MD for:  persistant nausea and vomiting   Complete by: As directed    Call MD for:  redness, tenderness, or signs of infection (pain, swelling, redness, odor or green/yellow discharge around incision site)   Complete by: As directed    Call MD for:  severe uncontrolled pain   Complete by: As directed    Call MD for:  temperature >100.4   Complete by: As directed    Diet - low sodium heart healthy   Complete by: As directed    Discharge instructions   Complete by: As directed    Check CBC and BMP in 1 week  Please call call MD or return to ER for similar or worsening recurring problem that brought you to hospital or if any fever,nausea/vomiting,abdominal pain, uncontrolled pain, chest pain,  shortness of breath or any other alarming symptoms.  Please follow-up your doctor as instructed in Joanna [name] week time and call the office for appointment.  Please avoid alcohol, smoking, or any other illicit substance and maintain healthy habits including taking your regular medications as prescribed.  You were cared for by Aniel Hubble hospitalist during your hospital stay. If you have any questions about your discharge medications or the care you received while you were in the hospital after you are discharged, you can call the unit and ask to speak with the hospitalist on call if the hospitalist that took care of you is not available.  Once you are discharged, your primary care physician will handle any further medical issues. Please note that NO REFILLS for any discharge medications will be authorized once you are discharged, as it is imperative that you return to your primary care physician (or establish Joanna [name] relationship with Chenika Nevils primary care physician if you do not have one) for your aftercare needs so that they can reassess your need for medications and monitor your lab values   Discharge instructions   Complete by:  As directed    You were seen with covid.  You've declined after this and your other recent hospitalization.   You should see neurology outpatient for your cognitive decline and right sided weakness.  We'll place Mikko Lewellen referral.  You should follow up with your outpatient providers for further workup of your high calcium levels.   Continue to take 3 ensures Leola Fiore day with 3 magic cups Edson Deridder day.  You'll need to keep an eye on your nutrition.  I recommend palliative care follow you as an outpatient to help make additional goals of care decisions depending on your overall trajectory.   Return for new, recurrent, or worsening symptoms.  Please ask your PCP to request records from this hospitalization so they know what was done and what the next steps will be.   Increase activity slowly   Complete by: As directed    Increase activity slowly   Complete by: As directed       Allergies as of 07/25/2021       Reactions   Zoloft [sertraline Hcl] Other (See Comments)   Made the patient feel withdrawn and had bad dreams- "Allergic,"  per [facility]   Ivp Dye [iodinated Diagnostic Agents] Rash, Other (See Comments)   "Allergic," per [facility]        Medication List     STOP taking these medications    indapamide 1.25 MG tablet Commonly known as: LOZOL   losartan 100 MG tablet Commonly known as: COZAAR   potassium chloride 10 MEQ tablet Commonly known as: KLOR-CON       TAKE these medications    acetaminophen 500 MG tablet Commonly known as: TYLENOL Take 1,000 mg by mouth every 8 (eight) hours as needed for moderate pain.   albuterol 108 (90 Base) MCG/ACT inhaler Commonly known as: VENTOLIN HFA Inhale 2 puffs into the lungs every 4 (four) hours as needed for wheezing or shortness of breath.   ascorbic acid 500 MG tablet Commonly known as: VITAMIN C Take 1 tablet (500 mg total) by mouth daily.   aspirin 81 MG EC tablet Take 1 tablet (81 mg total) by mouth daily. Swallow whole.    diphenhydrAMINE 25 mg capsule Commonly known as: BENADRYL Take 1 capsule (25 mg total) by mouth every 8 (eight) hours as needed for itching or allergies.   Ensure Plus High Protein Liqd Take 237 mLs by mouth 3 (three) times daily.   famotidine 20 MG tablet Commonly known as: PEPCID Take 1 tablet (20 mg total) by mouth 2 (two) times daily.   ferrous sulfate 325 (65 FE) MG tablet Take 325 mg by mouth daily with breakfast.   fexofenadine 180 MG tablet Commonly known as: ALLEGRA Take 180 mg by mouth daily as needed for allergies.   folic acid 1 MG tablet Commonly known as: FOLVITE TAKE 1 TABLET(1 MG) BY MOUTH DAILY   levothyroxine 88 MCG tablet Commonly known as: SYNTHROID Take 1 tablet (88 mcg total) by mouth daily before breakfast.   Magnesium 250 MG Tabs Take 1 tablet (250 mg total) by mouth 2 (two) times daily.   multivitamin with minerals Tabs tablet Take 1 tablet by mouth daily. Start taking on: July 26, 2021   rosuvastatin 20 MG tablet Commonly known as: CRESTOR Take 1 tablet by mouth daily (Stop Pravastatin) What changed:  how much to take how to take this when to take this additional instructions   thiamine 100 MG tablet Take 1 tablet (100 mg total) by mouth daily.   topiramate 50 MG tablet Commonly known as: Topamax Take 1 tablet (50 mg total) by mouth 2 (two) times daily.   traZODone 50 MG tablet Commonly known as: DESYREL TAKE 1/2 TO 1 TABLET BY MOUTH EVERY NIGHT FOR SLEEP What changed:  how much to take how to take this when to take this additional instructions   vitamin B-12 500 MCG tablet Commonly known as: CYANOCOBALAMIN Take 1,000 mcg by mouth daily.   zinc sulfate 220 (50 Zn) MG capsule Take 1 capsule (220 mg total) by mouth daily.       Allergies  Allergen Reactions   Zoloft [Sertraline Hcl] Other (See Comments)    Made the patient feel withdrawn and had bad dreams- "Allergic," per [facility]   Ivp Dye [Iodinated Diagnostic Agents]  Rash and Other (See Comments)    "Allergic," per [facility]    Contact information for follow-up providers     [name], Scott Lynessa Almanzar, MD Follow up in 1 week(s).   Specialty: Family Medicine Contact information: [address]  [place] [address] [phone]              Contact information  for after-discharge care     Destination     [facility] .   Service: Skilled Nursing Contact information: [place] [phone]                      The results of significant diagnostics from this hospitalization (including imaging, microbiology, ancillary and laboratory) are listed below for reference.    Significant Diagnostic Studies: CT HEAD WO CONTRAST (5MM)  Result Date: 07/07/2021 CLINICAL DATA:  Altered mental status EXAM: CT HEAD WITHOUT CONTRAST TECHNIQUE: Contiguous axial images were obtained from the base of the skull through the vertex without intravenous contrast. COMPARISON:  Brain MRI 11/12/2019, CT head 06/25/2021 FINDINGS: Brain: There is no evidence of acute intracranial hemorrhage, extra-axial fluid collection, or acute infarct. Foci of hypodensity in the subcortical and periventricular white matter are again seen, likely reflecting sequela of chronic white matter microangiopathy. The ventricles are stable in size. There is no mass lesion. There is no midline shift. Vascular: There is calcification of the bilateral cavernous ICAs. Skull: Normal. Negative for fracture or focal lesion. Sinuses/Orbits: The imaged paranasal sinuses are clear. Bilateral lens implants are in place. The globes and orbits are otherwise unremarkable. Other: None. IMPRESSION: No acute intracranial pathology. Electronically Signed   By: Valetta Mole M.D.   On: 07/07/2021 16:29   MR BRAIN WO CONTRAST  Result Date: 07/08/2021 CLINICAL DATA:  Initial evaluation for delirium. EXAM: MRI HEAD WITHOUT CONTRAST  TECHNIQUE: Multiplanar, multiecho pulse sequences of the brain and surrounding structures were obtained without intravenous contrast. COMPARISON:  Prior head CT from 07/07/2021. FINDINGS: Brain: Examination moderately degraded by motion artifact. Diffuse prominence of the CSF containing spaces compatible generalized cerebral atrophy. Patchy and confluent T2/FLAIR hyperintensity involving the periventricular and deep white matter both cerebral hemispheres consistent with chronic microvascular ischemic disease, moderately advanced in nature, and slightly more pronounced within the right cerebral hemispheres compared to the left. No abnormal foci of restricted diffusion to suggest acute or subacute ischemia. Gray-white matter differentiation maintained. No encephalomalacia to suggest chronic cortical infarction. No visible foci of susceptibility artifact to suggest acute or chronic intracranial hemorrhage. No mass lesion, midline shift or mass effect. No hydrocephalus or extra-axial fluid collection. Pituitary gland suprasellar region within normal limits. Midline structures intact. Vascular: Major intracranial vascular flow voids are maintained. Right vertebral artery somewhat dolichoectatic and partially invaginating upon the medulla. Skull and upper cervical spine: Craniocervical junction within normal limits. Postsurgical changes partially visualize within the upper cervical spine. Bone marrow signal intensity within normal limits. No scalp soft tissue abnormality. Sinuses/Orbits: Patient status post bilateral ocular lens replacement. Globes and orbital soft tissues demonstrate no acute finding. Paranasal sinuses are largely clear. No significant mastoid effusion. Other: None. IMPRESSION: 1. No acute intracranial abnormality. 2. Generalized age-related cerebral atrophy with moderately advanced chronic microvascular ischemic disease. Electronically Signed   By: Jeannine Boga M.D.   On: 07/08/2021 23:18   MR  CERVICAL SPINE WO CONTRAST  Result Date: 07/21/2021 CLINICAL DATA:  Left-sided weakness. EXAM: MRI CERVICAL, THORACIC AND LUMBAR SPINE WITHOUT CONTRAST TECHNIQUE: Multiplanar and multiecho pulse sequences of the cervical spine, to include the craniocervical junction and cervicothoracic junction, and thoracic and lumbar spine, were obtained without intravenous contrast. COMPARISON:  MRI of the cervical spine January 03, 2019. MRI of the lumbar spine Feb 25, 2013. FINDINGS: MRI CERVICAL SPINE FINDINGS The study is severely degraded by motion. Axial images are essentially nondiagnostic. Alignment: Straightening  of the cervical curvature. Vertebrae: Postsurgical changes from C4 through C6 ACDF. No gross marrow signal abnormality or edema. Cord: Grossly unremarkable. Posterior Fossa, vertebral arteries, paraspinal tissues: Negative. Disc levels: Posterior disc osteophyte complexes are noted at C3-4, C6-7 and C7-T1 causing indentation on the thecal sac without significant spinal canal stenosis. Small endplate ridging at H8-4 also causes small indentation of the thecal sac without significant spinal canal stenosis. No significant mass effect on the cord. Assessment for neural foraminal stenosis is precluded by motion artifacts. MRI THORACIC SPINE FINDINGS The study is severely degraded by motion. Images are essentially nondiagnostic. Alignment:  Physiologic. Vertebrae: Grossly unremarkable. Cord:  No high-grade cord compression. Paraspinal and other soft tissues: Negative. Disc levels: Posterior disc protrusion at T1-2 and prominence of the ligamentum flavum on the left Ruthvik Barnaby causing indentation on the thecal sac but does not appear to cause high-grade spinal canal stenosis. No high-grade spinal canal stenosis seen throughout the thoracic spine. Evaluation of the neural foramen is precluded by motion artifacts. MRI LUMBAR SPINE FINDINGS The study is degraded by motion, particularly the axial images and sagittal STIR.  Segmentation: Standard. Alignment:  Physiologic. Vertebrae:  No fracture, evidence of discitis, or bone lesion. Conus medullaris and cauda equina: Conus extends to the L1-2 level. Conus and cauda equina appear grossly unremarkable. Paraspinal and other soft tissues: Negative. Disc levels: T12-L1: No spinal canal or neural foraminal stenosis. L1-2: No spinal canal or neural foraminal stenosis. L2-3: Right asymmetric disc bulge resulting in moderate right and mild left neural foraminal narrowing. No spinal canal stenosis. Findings have progressed since prior MRI. L3-4: Left asymmetric disc bulge and mild facet degenerative changes resulting in narrowing of the left subarticular zone, mild right and moderate left neural foraminal narrowing. No significant spinal canal stenosis. Findings have progressed since prior MRI. L4-5: Shallow disc bulge and facet degenerative changes without significant spinal canal or neural foraminal stenosis. L5-S1: Facet degenerative changes. No spinal canal or neural foraminal stenosis. IMPRESSION: 1. Severely degraded study by motion artifacts with most sequences being essentially nondiagnostic, particularly in the cervical and thoracic spine. 2. No high-grade spinal canal stenosis noted throughout the cervical, thoracic and lumbar spine. 3. Moderate right and mild left neural foraminal narrowing at L2-3. 4. Mild right and moderate left neural foraminal narrowing at L3-4. 5. Assessment of neural foraminal stenosis in the cervical and thoracic spine Electronically Signed   By: Pedro Earls M.D.   On: 07/21/2021 16:15   MR THORACIC SPINE WO CONTRAST  Result Date: 07/21/2021 CLINICAL DATA:  Left-sided weakness. EXAM: MRI CERVICAL, THORACIC AND LUMBAR SPINE WITHOUT CONTRAST TECHNIQUE: Multiplanar and multiecho pulse sequences of the cervical spine, to include the craniocervical junction and cervicothoracic junction, and thoracic and lumbar spine, were obtained without  intravenous contrast. COMPARISON:  MRI of the cervical spine January 03, 2019. MRI of the lumbar spine Feb 25, 2013. FINDINGS: MRI CERVICAL SPINE FINDINGS The study is severely degraded by motion. Axial images are essentially nondiagnostic. Alignment: Straightening of the cervical curvature. Vertebrae: Postsurgical changes from C4 through C6 ACDF. No gross marrow signal abnormality or edema. Cord: Grossly unremarkable. Posterior Fossa, vertebral arteries, paraspinal tissues: Negative. Disc levels: Posterior disc osteophyte complexes are noted at C3-4, C6-7 and C7-T1 causing indentation on the thecal sac without significant spinal canal stenosis. Small endplate ridging at O9-6 also causes small indentation of the thecal sac without significant spinal canal stenosis. No significant mass effect on the cord. Assessment for neural foraminal stenosis is precluded by  motion artifacts. MRI THORACIC SPINE FINDINGS The study is severely degraded by motion. Images are essentially nondiagnostic. Alignment:  Physiologic. Vertebrae: Grossly unremarkable. Cord:  No high-grade cord compression. Paraspinal and other soft tissues: Negative. Disc levels: Posterior disc protrusion at T1-2 and prominence of the ligamentum flavum on the left Ayauna Mcnay causing indentation on the thecal sac but does not appear to cause high-grade spinal canal stenosis. No high-grade spinal canal stenosis seen throughout the thoracic spine. Evaluation of the neural foramen is precluded by motion artifacts. MRI LUMBAR SPINE FINDINGS The study is degraded by motion, particularly the axial images and sagittal STIR. Segmentation: Standard. Alignment:  Physiologic. Vertebrae:  No fracture, evidence of discitis, or bone lesion. Conus medullaris and cauda equina: Conus extends to the L1-2 level. Conus and cauda equina appear grossly unremarkable. Paraspinal and other soft tissues: Negative. Disc levels: T12-L1: No spinal canal or neural foraminal stenosis. L1-2: No spinal  canal or neural foraminal stenosis. L2-3: Right asymmetric disc bulge resulting in moderate right and mild left neural foraminal narrowing. No spinal canal stenosis. Findings have progressed since prior MRI. L3-4: Left asymmetric disc bulge and mild facet degenerative changes resulting in narrowing of the left subarticular zone, mild right and moderate left neural foraminal narrowing. No significant spinal canal stenosis. Findings have progressed since prior MRI. L4-5: Shallow disc bulge and facet degenerative changes without significant spinal canal or neural foraminal stenosis. L5-S1: Facet degenerative changes. No spinal canal or neural foraminal stenosis. IMPRESSION: 1. Severely degraded study by motion artifacts with most sequences being essentially nondiagnostic, particularly in the cervical and thoracic spine. 2. No high-grade spinal canal stenosis noted throughout the cervical, thoracic and lumbar spine. 3. Moderate right and mild left neural foraminal narrowing at L2-3. 4. Mild right and moderate left neural foraminal narrowing at L3-4. 5. Assessment of neural foraminal stenosis in the cervical and thoracic spine Electronically Signed   By: Pedro Earls M.D.   On: 07/21/2021 16:15   MR LUMBAR SPINE WO CONTRAST  Result Date: 07/21/2021 CLINICAL DATA:  Left-sided weakness. EXAM: MRI CERVICAL, THORACIC AND LUMBAR SPINE WITHOUT CONTRAST TECHNIQUE: Multiplanar and multiecho pulse sequences of the cervical spine, to include the craniocervical junction and cervicothoracic junction, and thoracic and lumbar spine, were obtained without intravenous contrast. COMPARISON:  MRI of the cervical spine January 03, 2019. MRI of the lumbar spine Feb 25, 2013. FINDINGS: MRI CERVICAL SPINE FINDINGS The study is severely degraded by motion. Axial images are essentially nondiagnostic. Alignment: Straightening of the cervical curvature. Vertebrae: Postsurgical changes from C4 through C6 ACDF. No gross marrow  signal abnormality or edema. Cord: Grossly unremarkable. Posterior Fossa, vertebral arteries, paraspinal tissues: Negative. Disc levels: Posterior disc osteophyte complexes are noted at C3-4, C6-7 and C7-T1 causing indentation on the thecal sac without significant spinal canal stenosis. Small endplate ridging at B0-4 also causes small indentation of the thecal sac without significant spinal canal stenosis. No significant mass effect on the cord. Assessment for neural foraminal stenosis is precluded by motion artifacts. MRI THORACIC SPINE FINDINGS The study is severely degraded by motion. Images are essentially nondiagnostic. Alignment:  Physiologic. Vertebrae: Grossly unremarkable. Cord:  No high-grade cord compression. Paraspinal and other soft tissues: Negative. Disc levels: Posterior disc protrusion at T1-2 and prominence of the ligamentum flavum on the left Keithan Dileonardo causing indentation on the thecal sac but does not appear to cause high-grade spinal canal stenosis. No high-grade spinal canal stenosis seen throughout the thoracic spine. Evaluation of the neural foramen is precluded by  motion artifacts. MRI LUMBAR SPINE FINDINGS The study is degraded by motion, particularly the axial images and sagittal STIR. Segmentation: Standard. Alignment:  Physiologic. Vertebrae:  No fracture, evidence of discitis, or bone lesion. Conus medullaris and cauda equina: Conus extends to the L1-2 level. Conus and cauda equina appear grossly unremarkable. Paraspinal and other soft tissues: Negative. Disc levels: T12-L1: No spinal canal or neural foraminal stenosis. L1-2: No spinal canal or neural foraminal stenosis. L2-3: Right asymmetric disc bulge resulting in moderate right and mild left neural foraminal narrowing. No spinal canal stenosis. Findings have progressed since prior MRI. L3-4: Left asymmetric disc bulge and mild facet degenerative changes resulting in narrowing of the left subarticular zone, mild right and moderate left  neural foraminal narrowing. No significant spinal canal stenosis. Findings have progressed since prior MRI. L4-5: Shallow disc bulge and facet degenerative changes without significant spinal canal or neural foraminal stenosis. L5-S1: Facet degenerative changes. No spinal canal or neural foraminal stenosis. IMPRESSION: 1. Severely degraded study by motion artifacts with most sequences being essentially nondiagnostic, particularly in the cervical and thoracic spine. 2. No high-grade spinal canal stenosis noted throughout the cervical, thoracic and lumbar spine. 3. Moderate right and mild left neural foraminal narrowing at L2-3. 4. Mild right and moderate left neural foraminal narrowing at L3-4. 5. Assessment of neural foraminal stenosis in the cervical and thoracic spine Electronically Signed   By: Pedro Earls M.D.   On: 07/21/2021 16:15   US RENAL  Result Date: 07/07/2021 CLINICAL DATA:  Acute renal insufficiency EXAM: RENAL / URINARY TRACT ULTRASOUND COMPLETE COMPARISON:  04/05/2020 FINDINGS: Right Kidney: Renal measurements: 7.4 x 3.4 x 3.7 cm = volume: 51 mL. Increased renal echogenicity. No hydronephrosis. Left Kidney: Renal measurements: 6.8 x 3.5 x 3.4 cm = volume: 42.5 mL. Increased renal echogenicity. No hydronephrosis. Bladder: Partially decompressed. Other: None. IMPRESSION: No hydronephrosis. Small, hyperechoic kidneys, consistent with medical renal disease. Electronically Signed   By: Abigail Miyamoto M.D.   On: 07/07/2021 18:41   DG Chest Port 1 View  Result Date: 07/09/2021 CLINICAL DATA:  Systemic inflammatory response EXAM: PORTABLE CHEST 1 VIEW COMPARISON:  07/07/2021 FINDINGS: Interval development of right infrahilar airspace disease. No pleural effusion. Stable cardiomediastinal silhouette with aortic atherosclerosis. No pneumothorax. Hardware in the cervical spine IMPRESSION: Interval development of right infrahilar airspace disease, suspicious for pneumonia. Electronically  Signed   By: Donavan Foil M.D.   On: 07/09/2021 23:52   DG Chest Portable 1 View  Result Date: 07/07/2021 CLINICAL DATA:  Altered mental status EXAM: PORTABLE CHEST 1 VIEW COMPARISON:  06/24/2021 FINDINGS: Cardiac shadow is mildly prominent but stable. Aortic calcifications are again seen and stable. Postsurgical changes are noted in the cervical spine. Lungs are clear bilaterally. Previously seen radiopaque density over the mid chest is no longer identified and likely extrinsic to the patient. IMPRESSION: No acute abnormality noted. Electronically Signed   By: Inez Catalina M.D.   On: 07/07/2021 16:01   EEG adult  Result Date: 07/20/2021 Lora Havens, MD     07/20/2021  4:57 PM Patient Name: IREENE BALLOWE MRN: [id number] Epilepsy Attending: Lora Havens Referring Physician/Provider: Dr Fayrene Helper Date: 07/20/2021 Duration: 22.57 mins Patient history: 70yo F with ams. EEG to evaluate for seizure Level of alertness: Awake AEDs during EEG study: TPM Technical aspects: This EEG study was done with scalp electrodes positioned according to the 10-20 International system of electrode placement. Electrical activity was acquired at Neveen Daponte sampling rate of '500Hz'   and reviewed with Niesha Bame high frequency filter of '70Hz'  and Sojourner Behringer low frequency filter of '1Hz' . EEG data were recorded continuously and digitally stored. Description: The posterior dominant rhythm consists of '8Hz'  activity of moderate voltage (25-35 uV) seen predominantly in posterior head regions, symmetric and reactive to eye opening and eye closing. EEG showed intermittent generalized 3 to 6 Hz theta-delta slowing. Physiologic photic driving was not seen during photic stimulation.  Hyperventilation was not performed.   ABNORMALITY - Intermittent slow, generalized IMPRESSION: This study is suggestive of mild diffuse encephalopathy, nonspecific etiology. No seizures or epileptiform discharges were seen throughout the recording. Lora Havens     Microbiology: No results found for this or any previous visit (from the past 240 hour(s)).   Labs: Basic Metabolic Panel: Recent Labs  Lab 07/21/21 0346 07/22/21 0323 07/23/21 0250 07/24/21 0406 07/24/21 0849 07/25/21 0330  NA 140 139 140  --  139 138  K 4.2 4.3 4.3  --  4.3 4.5  CL 109 108 110  --  111 113*  CO2 21* 23 23  --  22 18*  GLUCOSE 85 112* 124*  --  116* 102*  BUN 29* 30* 39*  --  45* 43*  CREATININE 1.48* 1.43* 1.52*  --  1.43* 1.36*  CALCIUM 10.2 11.0* 10.8* 10.9* 10.3 10.0  MG 2.2 2.4 2.5*  --   --  2.0  PHOS 3.5 3.7 3.5  --   --  2.7   Liver Function Tests: Recent Labs  Lab 07/21/21 0346 07/22/21 0323 07/23/21 0250  AST 68* 40 31  ALT 62* 52* 45*  ALKPHOS 69 67 64  BILITOT 0.4 0.4 0.3  PROT 6.0* 6.1* 6.4*  ALBUMIN 2.7* 2.9* 2.9*   No results for input(s): LIPASE, AMYLASE in the last 168 hours. No results for input(s): AMMONIA in the last 168 hours. CBC: Recent Labs  Lab 07/21/21 0346 07/22/21 0323 07/23/21 0250 07/25/21 0330  WBC 5.8 6.6 7.7 10.2  NEUTROABS 3.4 4.0 4.9  --   HGB 8.4* 8.8* 8.6* 7.5*  HCT 27.0* 28.7* 27.9* 24.4*  MCV 91.8 92.9 92.4 95.7  PLT 334 352 375 312   Cardiac Enzymes: No results for input(s): CKTOTAL, CKMB, CKMBINDEX, TROPONINI in the last 168 hours. BNP: BNP (last 3 results) No results for input(s): BNP in the last 8760 hours.  ProBNP (last 3 results) No results for input(s): PROBNP in the last 8760 hours.  CBG: Recent Labs  Lab 07/24/21 0623 07/24/21 1134 07/24/21 2349 07/25/21 0601 07/25/21 1153  GLUCAP 118* 132* 139* 139* 97       Signed:  Fayrene Helper MD.  [facility] 07/25/2021, 2:48 PM

## 2021-07-25 NOTE — Progress Notes (Signed)
Calorie Count Note  48-hour calorie count ordered.  Diet: Regular Supplements: Ensure TID, Magic Cup TID  Day 1: 10/7 Dinner: 271 kcal, 13 grams protein Supplements 350 kcal, 20 grams protein (1 Ensure)  Day 2: 10/8 Breakfast: 0 kcal, 0 grams protein Lunch: No ticket for this meal Dinner: 140 kcal, 8 grams protein Supplements: 990 kcal, 49 grams protein (2 Ensures, 1 Magic Cup)  Day 3: 10/9 Breakfast: No ticket for this meal Lunch: 115 kcal, 2 grams protein Dinner: 42 kcal, 3 grams protein Supplements: 1300 kcal, 60 grams protein (3 Ensures, 1 Boost Breeze)  Total intake: 1604 kcal (87% of minimum estimated needs)  78 grams of protein (86% of minimum estimated needs)  Nutrition Dx: Severe Malnutrition related to chronic illness as evidenced by severe fat depletion, severe muscle depletion, percent weight loss. - ongoing  Goal: Patient will meet greater than or equal to 90% of their needs. - progressing, almost met  Intervention:  Discontinue calorie count. Continue Ensure Plus High Protein TID. Continue Magic Cup TID. Continue MVI with minerals daily. Continue assistance with feeding as needed. Continue to encourage supplement intake.  Derrel Nip, RD, LDN (she/her/hers) Registered Dietitian I After-Hours/Weekend Pager # in [place]

## 2021-07-25 NOTE — Plan of Care (Signed)
  Problem: Clinical Measurements: Goal: Ability to maintain clinical measurements within normal limits will improve Outcome: Progressing   Problem: Nutrition: Goal: Adequate nutrition will be maintained Outcome: Progressing   Problem: Coping: Goal: Level of anxiety will decrease Outcome: Progressing   Problem: Safety: Goal: Ability to remain free from injury will improve Outcome: Progressing   

## 2021-07-26 DIAGNOSIS — R519 Headache, unspecified: Secondary | ICD-10-CM | POA: Diagnosis not present

## 2021-07-26 DIAGNOSIS — N179 Acute kidney failure, unspecified: Secondary | ICD-10-CM | POA: Diagnosis not present

## 2021-07-26 DIAGNOSIS — I10 Essential (primary) hypertension: Secondary | ICD-10-CM | POA: Diagnosis not present

## 2021-07-26 DIAGNOSIS — D649 Anemia, unspecified: Secondary | ICD-10-CM | POA: Diagnosis not present

## 2021-07-26 DIAGNOSIS — N390 Urinary tract infection, site not specified: Secondary | ICD-10-CM | POA: Diagnosis not present

## 2021-07-26 DIAGNOSIS — G8194 Hemiplegia, unspecified affecting left nondominant side: Secondary | ICD-10-CM | POA: Diagnosis not present

## 2021-07-26 DIAGNOSIS — E119 Type 2 diabetes mellitus without complications: Secondary | ICD-10-CM | POA: Diagnosis not present

## 2021-07-26 DIAGNOSIS — K219 Gastro-esophageal reflux disease without esophagitis: Secondary | ICD-10-CM | POA: Diagnosis not present

## 2021-07-26 DIAGNOSIS — U071 COVID-19: Secondary | ICD-10-CM | POA: Diagnosis not present

## 2021-07-26 DIAGNOSIS — N184 Chronic kidney disease, stage 4 (severe): Secondary | ICD-10-CM | POA: Diagnosis not present

## 2021-07-26 DIAGNOSIS — R627 Adult failure to thrive: Secondary | ICD-10-CM | POA: Diagnosis not present

## 2021-07-27 DIAGNOSIS — E1165 Type 2 diabetes mellitus with hyperglycemia: Secondary | ICD-10-CM | POA: Diagnosis not present

## 2021-07-27 DIAGNOSIS — N390 Urinary tract infection, site not specified: Secondary | ICD-10-CM | POA: Diagnosis not present

## 2021-07-27 DIAGNOSIS — N183 Chronic kidney disease, stage 3 unspecified: Secondary | ICD-10-CM | POA: Diagnosis not present

## 2021-07-27 DIAGNOSIS — I10 Essential (primary) hypertension: Secondary | ICD-10-CM | POA: Diagnosis not present

## 2021-08-04 ENCOUNTER — Other Ambulatory Visit (HOSPITAL_COMMUNITY): Payer: Self-pay | Admitting: Hematology

## 2021-08-05 DIAGNOSIS — N184 Chronic kidney disease, stage 4 (severe): Secondary | ICD-10-CM | POA: Diagnosis not present

## 2021-08-05 DIAGNOSIS — E039 Hypothyroidism, unspecified: Secondary | ICD-10-CM | POA: Diagnosis not present

## 2021-08-05 DIAGNOSIS — I10 Essential (primary) hypertension: Secondary | ICD-10-CM | POA: Diagnosis not present

## 2021-08-05 DIAGNOSIS — D649 Anemia, unspecified: Secondary | ICD-10-CM | POA: Diagnosis not present

## 2021-08-05 DIAGNOSIS — R627 Adult failure to thrive: Secondary | ICD-10-CM | POA: Diagnosis not present

## 2021-08-08 ENCOUNTER — Ambulatory Visit: Payer: PPO | Admitting: Neurology

## 2021-08-08 ENCOUNTER — Encounter: Payer: Self-pay | Admitting: Neurology

## 2021-08-11 ENCOUNTER — Other Ambulatory Visit: Payer: Self-pay

## 2021-08-11 MED ORDER — LEVOTHYROXINE SODIUM 88 MCG PO TABS: 88.0000 ug | ORAL_TABLET | Freq: Every day | ORAL | 0 refills | Status: DC

## 2021-08-23 ENCOUNTER — Encounter: Payer: Self-pay | Admitting: Family Medicine

## 2021-08-23 ENCOUNTER — Other Ambulatory Visit: Payer: Self-pay

## 2021-08-23 ENCOUNTER — Ambulatory Visit (INDEPENDENT_AMBULATORY_CARE_PROVIDER_SITE_OTHER): Payer: PPO | Admitting: Family Medicine

## 2021-08-23 VITALS — BP 128/78

## 2021-08-23 DIAGNOSIS — Z23 Encounter for immunization: Secondary | ICD-10-CM

## 2021-08-23 DIAGNOSIS — Z79899 Other long term (current) drug therapy: Secondary | ICD-10-CM

## 2021-08-23 DIAGNOSIS — G8114 Spastic hemiplegia affecting left nondominant side: Secondary | ICD-10-CM | POA: Diagnosis not present

## 2021-08-23 DIAGNOSIS — N1832 Chronic kidney disease, stage 3b: Secondary | ICD-10-CM

## 2021-08-23 DIAGNOSIS — G9340 Encephalopathy, unspecified: Secondary | ICD-10-CM

## 2021-08-23 DIAGNOSIS — D6489 Other specified anemias: Secondary | ICD-10-CM

## 2021-08-23 DIAGNOSIS — R7401 Elevation of levels of liver transaminase levels: Secondary | ICD-10-CM

## 2021-08-23 NOTE — Progress Notes (Signed)
Subjective:    Patient ID: Joanna [name], female    DOB: 01/29/1951, 70 y.o.   MRN: [id number]  HPI Pt here for hospital follow up. Pt was in [facility] from 07/07/21-07/25/21. Pt needs pt lift and order speech and physical therapy. Pt is needing total assistance with ADLs.  Elevated transaminase level - Plan: CBC with Differential, Comprehensive Metabolic Panel (CMET), TSH, Ambulatory referral to Speech Therapy, Ambulatory referral to Physical Therapy, Ambulatory referral to [place] 3b chronic kidney disease ([place]) - Plan: CBC with Differential, Comprehensive Metabolic Panel (CMET), TSH, Ambulatory referral to Speech Therapy, Ambulatory referral to Physical Therapy, Ambulatory referral to Home Health  Anemia due to other cause, not classified - Plan: CBC with Differential, Comprehensive Metabolic Panel (CMET), TSH, Ambulatory referral to Speech Therapy, Ambulatory referral to Physical Therapy, Ambulatory referral to [place]  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  She also had severe COVID as well as infection as a result she has not been able to be on top of things and depends on others for her ADLs.  She is wheelchair-bound.  There is hope that she will recover some.  More than likely she will need to stay at the rest home ongoing  Review of Systems     Objective:   Physical  Exam  General-in no acute distress Eyes-no discharge Lungs-respiratory rate normal, CTA CV-no murmurs,RRR Extremities skin warm dry no edema Neuro grossly normal Behavior normal, alert She is aware who she is where she is at and who I am patient know unable to give [place] the date      Assessment & Plan:   1. Elevated transaminase level We will check liver profile this was elevated when she was in the hospital - CBC with Differential - Comprehensive Metabolic Panel (CMET) - TSH - Ambulatory referral to Speech Therapy - Ambulatory referral to Physical Therapy - Ambulatory referral to Home Health  2. Stage 3b chronic kidney disease ([place]) Significant kidney dysfunction we need to do further evaluation to look into this to see where it sat currently labs ordered - CBC with Differential - Comprehensive Metabolic Panel (CMET) - TSH - Ambulatory referral to Speech Therapy - Ambulatory referral to Physical Therapy - Ambulatory referral to [place]  3. Anemia due to other cause, not classified Significant anemia do follow-up CBC - CBC with Differential - Comprehensive Metabolic Panel (CMET) - TSH - Ambulatory referral to Speech Therapy - Ambulatory referral to Physical Therapy - Ambulatory referral to [place]  4. 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  She also has less use of her left leg.  She is wheelchair-bound.  Potentially will do neurology consult upcoming but hold off on this currently patient to follow-up in 4 to 6 weeks.  Patient had MRI in the hospital but this did not reveal any type of corrective issues - CBC with Differential -  Comprehensive Metabolic Panel (CMET) - TSH - Ambulatory referral to Speech Therapy - Ambulatory referral to Physical Therapy - Ambulatory referral to [place] Patient is wheelchair-bound I believe she would benefit from speech  therapy physical therapy and home therapy.  We will try to do the best we can to get this set up with home health.  We will also need a lift chair may need other items as well 5. High risk medication use Labs ordered - CBC with Differential - Comprehensive Metabolic Panel (CMET) - TSH - Ambulatory referral to Speech Therapy - Ambulatory referral to Physical Therapy - Ambulatory referral to [place]  6. Need for vaccination Flu shot - Flu Vaccine QUAD High Dose(Fluad)  7. Encephalopathy Severe encephalopathy.  This is also resulted with dementia and related to this underlying condition unlikely that the patient will improve dramatically.  Will monitor closely

## 2021-08-24 LAB — TSH: TSH: 59.1 u[IU]/mL — ABNORMAL HIGH (ref 0.450–4.500)

## 2021-08-24 LAB — COMPREHENSIVE METABOLIC PANEL
ALT: 17 IU/L (ref 0–32)
AST: 24 IU/L (ref 0–40)
Albumin/Globulin Ratio: 2 (ref 1.2–2.2)
Albumin: 4 g/dL (ref 3.8–4.8)
Alkaline Phosphatase: 88 IU/L (ref 44–121)
BUN/Creatinine Ratio: 18 (ref 12–28)
BUN: 28 mg/dL — ABNORMAL HIGH (ref 8–27)
Bilirubin Total: <0.2 mg/dL (ref 0.0–1.2)
CO2: 21 mmol/L (ref 20–29)
Calcium: 10.8 mg/dL — ABNORMAL HIGH (ref 8.7–10.3)
Chloride: 108 mmol/L — ABNORMAL HIGH (ref 96–106)
Creatinine, Ser: 1.59 mg/dL — ABNORMAL HIGH (ref 0.57–1.00)
Globulin, Total: 2 g/dL (ref 1.5–4.5)
Glucose: 102 mg/dL — ABNORMAL HIGH (ref 70–99)
Potassium: 4.8 mmol/L (ref 3.5–5.2)
Sodium: 141 mmol/L (ref 134–144)
Total Protein: 6 g/dL (ref 6.0–8.5)
eGFR: 35 mL/min/{1.73_m2} — ABNORMAL LOW (ref >59)

## 2021-08-24 LAB — CBC WITH DIFFERENTIAL/PLATELET
Basophils Absolute: 0.1 10*3/uL (ref 0.0–0.2)
Basos: 1 %
EOS (ABSOLUTE): 0.1 10*3/uL (ref 0.0–0.4)
Eos: 1 %
Hematocrit: 26.9 % — ABNORMAL LOW (ref 34.0–46.6)
Hemoglobin: 8.2 g/dL — ABNORMAL LOW (ref 11.1–15.9)
Immature Grans (Abs): 0 10*3/uL (ref 0.0–0.1)
Immature Granulocytes: 0 %
Lymphocytes Absolute: 1.3 10*3/uL (ref 0.7–3.1)
Lymphs: 24 %
MCH: 28.6 pg (ref 26.6–33.0)
MCHC: 30.5 g/dL — ABNORMAL LOW (ref 31.5–35.7)
MCV: 94 fL (ref 79–97)
Monocytes Absolute: 0.3 10*3/uL (ref 0.1–0.9)
Monocytes: 6 %
Neutrophils Absolute: 3.8 10*3/uL (ref 1.4–7.0)
Neutrophils: 68 %
Platelets: 352 10*3/uL (ref 150–450)
RBC: 2.87 x10E6/uL — ABNORMAL LOW (ref 3.77–5.28)
RDW: 13.9 % (ref 11.7–15.4)
WBC: 5.6 10*3/uL (ref 3.4–10.8)

## 2021-08-29 ENCOUNTER — Other Ambulatory Visit: Payer: Self-pay | Admitting: Family Medicine

## 2021-08-29 DIAGNOSIS — D6489 Other specified anemias: Secondary | ICD-10-CM

## 2021-08-29 DIAGNOSIS — E039 Hypothyroidism, unspecified: Secondary | ICD-10-CM

## 2021-08-29 DIAGNOSIS — E876 Hypokalemia: Secondary | ICD-10-CM

## 2021-08-29 DIAGNOSIS — Z79899 Other long term (current) drug therapy: Secondary | ICD-10-CM

## 2021-08-29 DIAGNOSIS — R7401 Elevation of levels of liver transaminase levels: Secondary | ICD-10-CM

## 2021-08-29 DIAGNOSIS — I10 Essential (primary) hypertension: Secondary | ICD-10-CM

## 2021-08-29 MED ORDER — LEVOTHYROXINE SODIUM 100 MCG PO TABS: ORAL_TABLET | ORAL | 1 refills | Status: DC

## 2021-08-29 NOTE — Addendum Note (Signed)
Addended by: [name] on: 08/29/2021 01:57 PM   Modules accepted: Orders

## 2021-10-04 ENCOUNTER — Ambulatory Visit: Payer: PPO | Admitting: Family Medicine

## 2021-10-06 ENCOUNTER — Telehealth: Payer: Self-pay | Admitting: Family Medicine

## 2021-10-06 NOTE — Telephone Encounter (Signed)
Person from [facility] came by and dropped off papers for Dr Nicki Reaper to fill out. They are in

## 2021-10-26 ENCOUNTER — Ambulatory Visit: Payer: PPO | Admitting: Family Medicine

## 2021-10-27 ENCOUNTER — Other Ambulatory Visit: Payer: Self-pay | Admitting: Neurology

## 2021-10-27 ENCOUNTER — Ambulatory Visit (INDEPENDENT_AMBULATORY_CARE_PROVIDER_SITE_OTHER): Payer: PPO | Admitting: Neurology

## 2021-10-27 ENCOUNTER — Encounter: Payer: Self-pay | Admitting: Neurology

## 2021-10-27 VITALS — BP 132/90 | HR 107 | Ht 62.0 in

## 2021-10-27 DIAGNOSIS — R269 Unspecified abnormalities of gait and mobility: Secondary | ICD-10-CM

## 2021-10-27 DIAGNOSIS — E538 Deficiency of other specified B group vitamins: Secondary | ICD-10-CM | POA: Diagnosis not present

## 2021-10-27 DIAGNOSIS — G8114 Spastic hemiplegia affecting left nondominant side: Secondary | ICD-10-CM | POA: Diagnosis not present

## 2021-10-27 DIAGNOSIS — R7989 Other specified abnormal findings of blood chemistry: Secondary | ICD-10-CM | POA: Diagnosis not present

## 2021-10-27 DIAGNOSIS — R799 Abnormal finding of blood chemistry, unspecified: Secondary | ICD-10-CM | POA: Diagnosis not present

## 2021-10-27 MED ORDER — DULOXETINE HCL 60 MG PO CPEP: 60.0000 mg | ORAL_CAPSULE | Freq: Every day | ORAL | 11 refills | Status: DC

## 2021-10-27 NOTE — Patient Instructions (Signed)
Repeat laboratory evaluation today  Return to clinic in 1 month for electrical stimulation guided botulism toxin injection for painful spastic left upper extremity spasticity, to alleviate the pain, and spasticity some Add on Cymbalta, 60 mg daily  Extensive imaging study including MRI of the brain, cervical, lumbar, thoracic spine failed to demonstrate etiology

## 2021-10-27 NOTE — Progress Notes (Signed)
Chief Complaint  Patient presents with   New Patient (Initial Visit)    Rm 14. PCP is Dr. Sallee [name]. Accompanied by transporter and friend. ED referral for altered mental status, spastic hemiplegia and L sided weakness.      ASSESSMENT AND PLAN  Joanna [name] is [name] 71 y.o. female   Gradual onset left more than right spasticity, gait abnormality,  Differentiation diagnosis include central nervous system degenerative disorder, such as basal ganglion degeneration,  She has significant spasticity especially of left upper extremity, pain with passive movement,  Return to clinic for electrical stimulation guided xeomin injection for left upper extremity, ask for 600 units  Lab evaluations   DIAGNOSTIC DATA (LABS, IMAGING, TESTING) - I reviewed patient records, labs, notes, testing and imaging myself where available MRI of the brain without contrast September 2022  1. No acute intracranial abnormality. 2. Generalized age-related cerebral atrophy with moderately advanced chronic microvascular ischemic disease.  MRI of spinal October 2022 Severely degraded study by motion artifact, no high-grade stenosis throughout cervical, thoracic lumbar spine, variable degree of foraminal at lumbar region  Chest x-ray July 09, 2021 Right infrahilar airspace disease, suspicious for pneumonia  Laboratory evaluations 2022: Significantly elevated TSH 59, CMP showed elevated creatinine 1.59, calcium 10.8, CBC, hemoglobin of 8.2,  MEDICAL HISTORY:  Joanna [name], is [name] 71 year old female, seen in request by her primary care physician Dr. Wolfgang [name], Joanna [name], for evaluation of left hand spasticity, gait abnormality, initial evaluation was on October 27, 2021,  I reviewed and summarized the referring note. PMHX. DM Hypothyrodism HLD Cervical, Lumbar decompression surgery.  Patient was [name] poor historian, she is accompanied by Joanna [name] her longtime friend and Joanna [name] from the rest home at today's  visit,  I also reviewed hospital discharge in October 2022, when she was admitted for chronic urinary incontinence, UTI, mental status change, acute on chronic kidney failure, COVID was positive, ultrasound of kidneys showed echogenic kidneys, also was treated for pneumonia,  She has metabolic encephalopathy, deconditioning, was treated with remdesivir, IV fluid,  I personally reviewed MRI of brain July 08, 2021, no acute abnormality, generalized atrophy, moderate small vessel disease  MRI of cervical spine, lumbar, thoracic spine, severely degraded study by motion, no high-grade spinal canal stenosis was noted,  Patient was also noted to have severe malnutrition, poor p.o. intake,  Patient has 2 children were both out of state, she retired from Psychologist, educational job at age 20, deny gait difficulty at that time, she lives alone at apartment until age 67, she began to notice left hand spasticity, achiness, spreading to left arm, shoulder, then later developed gait abnormality, left worse than right, become nonambulatory since summer 2022, she was hospitalized on April 19, 2021, for near syncope, with severe hypomagnesia M, hypokalemia,  Echocardiogram, ultrasound of carotid arteries showed no significant abnormality,  She does have bowel and bladder incontinence, wear diaper,   PHYSICAL EXAM:   Vitals:   10/27/21 0906  BP: 132/90  Pulse: (!) 107  Height: 5\' 2"  (1.575 m)   Not recorded     Body mass index is 20.69 kg/m.  PHYSICAL EXAMNIATION:  Gen: NAD, conversant, well nourised, well groomed                     Cardiovascular: Regular rate rhythm, no peripheral edema, warm, nontender. Eyes: Conjunctivae clear without exudates or hemorrhage Neck: Supple, no carotid bruits. Pulmonary: Clear to auscultation bilaterally   NEUROLOGICAL EXAM:  MENTAL STATUS: Speech/cognition:  Soft voice, oriented to history taking, and casual conversation, rarely spontaneous  information.  CRANIAL NERVES: CN II: Visual fields are full to confrontation. Pupils are round equal and briskly reactive to light. CN III, IV, VI: extraocular movement are normal. No ptosis. CN V: Facial sensation is intact to light touch CN VII: Face is symmetric with normal eye closure  CN VIII: Hearing is normal to causal conversation. CN IX, X: Phonation is normal. CN XI: Head turning and shoulder shrug are intact  MOTOR: She slumped in the wheelchair, significant spasticity, drop of left upper extremity, with left shoulder abduction, elbow flexion, wrist flexion, thumb in position, complains of pain with passive movement, but still has fairly range of motion with left shoulder, elbow, wrist, fingers with passive movement,  Able to use right arm, right leg freely, antigravity movement of left lower extremity,  REFLEXES: hypoactive and symmetric  SENSORY: Intact to light touch, pinprick and vibratory sensation are intact in fingers and toes.  COORDINATION: There is no trunk or limb dysmetria noted.  GAIT/STANCE: deferred.  REVIEW OF SYSTEMS:  Full 14 system review of systems performed and notable only for as above All other review of systems were negative.   ALLERGIES: Allergies  Allergen Reactions   Zoloft [Sertraline Hcl] Other (See Comments)    Made the patient feel withdrawn and had bad dreams- "Allergic," per [facility]   Ivp Dye [Iodinated Contrast Media] Rash and Other (See Comments)    "Allergic," per [facility]    HOME MEDICATIONS: Current Outpatient Medications  Medication Sig Dispense Refill   acetaminophen (TYLENOL) 500 MG tablet Take 1,000 mg by mouth every 8 (eight) hours as needed for moderate pain.     albuterol (PROVENTIL HFA;VENTOLIN HFA) 108 (90 Base) MCG/ACT inhaler Inhale 2 puffs into the lungs every 4 (four) hours as needed for wheezing or shortness of breath. 1 Inhaler 5   ascorbic acid (VITAMIN C) 500 MG tablet Take 1 tablet (500 mg total) by mouth daily.      aspirin EC 81 MG EC tablet Take 1 tablet (81 mg total) by mouth daily. Swallow whole. 30 tablet    diphenhydrAMINE (BENADRYL) 25 mg capsule Take 1 capsule (25 mg total) by mouth every 8 (eight) hours as needed for itching or allergies. 30 capsule 0   famotidine (PEPCID) 20 MG tablet Take 1 tablet (20 mg total) by mouth 2 (two) times daily. 60 tablet 11   ferrous sulfate 325 (65 FE) MG tablet Take 325 mg by mouth daily with breakfast.     fexofenadine (ALLEGRA) 180 MG tablet Take 180 mg by mouth daily as needed for allergies.     folic acid (FOLVITE) 1 MG tablet TAKE 1 TABLET(1 MG) BY MOUTH DAILY 30 tablet 2   levothyroxine (SYNTHROID) 100 MCG tablet Take one tablet po each morning 30 min before breakfast 90 tablet 1   Magnesium 250 MG TABS Take 1 tablet (250 mg total) by mouth 2 (two) times daily.  0   Multiple Vitamin (MULTIVITAMIN WITH MINERALS) TABS tablet Take 1 tablet by mouth daily.     Nutritional Supplements (ENSURE PLUS HIGH PROTEIN) LIQD Take 237 mLs by mouth 3 (three) times daily.     rosuvastatin (CRESTOR) 20 MG tablet Take 1 tablet by mouth daily (Stop Pravastatin) (Patient taking differently: Take 20 mg by mouth daily.) 90 tablet 1   thiamine 100 MG tablet Take 1 tablet (100 mg total) by mouth daily.     topiramate (TOPAMAX) 50 MG tablet Take  1 tablet (50 mg total) by mouth 2 (two) times daily. 180 tablet 0   traZODone (DESYREL) 50 MG tablet TAKE 1/2 TO 1 TABLET BY MOUTH EVERY NIGHT FOR SLEEP (Patient taking differently: Take 25 mg by mouth at bedtime.) 90 tablet 3   vitamin B-12 (CYANOCOBALAMIN) 500 MCG tablet Take 1,000 mcg by mouth daily.     zinc sulfate 220 (50 Zn) MG capsule Take 1 capsule (220 mg total) by mouth daily.     No current facility-administered medications for this visit.    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 GIVENS   Bilateral headaches    Depression    Diabetes  mellitus    Diverticulosis    Dyslipidemia    GERD (gastroesophageal reflux disease)    Hypertension    Insomnia    Microproteinuria    Renal insufficiency 2010   Right carpal tunnel syndrome 12/24/2017   Sleep apnea    Sleep apnea 12/25/2017   Abnormal sleep study February 2019 CPAP ordered   Thyroid disease    hypothyroid    PAST SURGICAL HISTORY: Past Surgical History:  Procedure Laterality Date   ABDOMINAL HYSTERECTOMY  1984   BACK SURGERY     BILATERAL OOPHORECTOMY     BIOPSY  02/19/2020   Procedure: BIOPSY;  Surgeon: [name], MD;  [facility];  Service: Endoscopy;;  gastric   CERVICAL DISC SURGERY  01/16/2019   COLONOSCOPY  01/29/12   Fields-2-3 hyperplastic polyps, mod internal hemorrhoids, diverticulosis throughout colon   COLONOSCOPY N/[name] 07/03/2014   Dr. Oneida Alar: Hyperplastic polyps   COLONOSCOPY N/[name] 06/06/2019   Dr. Oneida Alar: Diverticulosis, hemorrhoids, single polyp removed, pathology revealed lipoma.  Next colonoscopy 10 years   ESOPHAGOGASTRODUODENOSCOPY   01/29/2012   Fields-2-3cm sliding HH, fundic gland polyps, NEGATIVE H pylori   ESOPHAGOGASTRODUODENOSCOPY N/[name] 07/03/2014   Dr. Oneida Alar: gastritis and normal small bowel biopsies   ESOPHAGOGASTRODUODENOSCOPY N/[name] 02/19/2020   Fields: hypertrophic folds in the stomach. benign polyp removed   GIVENS CAPSULE STUDY N/[name] 10/26/2014   SMALL BOWEL AVMs   KNEE ARTHROSCOPY Right    POLYPECTOMY  06/06/2019   Procedure: POLYPECTOMY;  Surgeon: [name], MD;  [facility]: [facility];  Service: Endoscopy;;   TOTAL ABDOMINAL HYSTERECTOMY W/ BILATERAL SALPINGOOPHORECTOMY      FAMILY HISTORY: Family History  Problem Relation Age of Onset   Cardiomyopathy Father    Heart failure Mother    Hyperlipidemia Mother    Stroke Brother    Kidney cancer Brother    Diabetes Paternal Uncle    Heart disease Paternal Grandmother    Colon cancer Neg Hx    Colon polyps Neg Hx     SOCIAL HISTORY: Social History    Socioeconomic History   Marital status: Divorced    Spouse name: Not on file   Number of children: 2   Years of education: Not on file   Highest education level: Not on file  Occupational History   Occupation: retired    Comment: 2014 or so  Tobacco Use   Smoking status: Every Day    Packs/day: 0.25    Years: 30.00    Pack years: 7.50    Types: Cigarettes    Last attempt to quit: 10/16/2010    Years since quitting: 11.0   Smokeless tobacco: Former    Quit date: 10/28/2010  Vaping Use   Vaping Use: Never used  Substance and Sexual Activity   Alcohol use: Yes    Comment: socially, maybe 3 times [name] year    Drug use: No   Sexual activity: Not on file  Other Topics Concern   Not on file  Social History Narrative   Right handed    Lives alone    Caffeine- 5-6 cups per day    Social Determinants of Health   Financial Resource Strain: Low Risk    Difficulty of Paying Living Expenses: Not hard at all  Food Insecurity: No Food Insecurity   Worried About Charity fundraiser in the Last Year: Never true   Ran Out of Food in the Last Year: Never true  Transportation Needs: No Transportation Needs   Lack of Transportation (Medical): No   Lack of Transportation (Non-Medical): No  Physical Activity: Inactive   Days of Exercise per Week: 0 days   Minutes of Exercise per Session: 0 min  Stress: No Stress Concern Present   Feeling of Stress : Not at all  Social Connections: Socially Isolated   Frequency of Communication with Friends and Family: More than three times [name] week   Frequency of Social Gatherings with Friends and Family: More than three times [name] week   Attends Religious Services: Never   Marine scientist or Organizations: No   Attends Music therapist: Never   Marital Status: Divorced  Human resources officer Violence: Not At Risk   Fear of Current or Ex-Partner: No   Emotionally Abused: No   Physically Abused: No   Sexually Abused: No    Total time  spent reviewing the chart, obtaining history, examined patient, ordering tests, documentation, consultations and family, care coordination was 37 minutes    Marcial Pacas, M.D. Ph.D.  [facility] [address], [place], [place] [address] Ph: [phone] Fax: [phone]  CC:  Daryll Brod, MD [address] [place],  [place] [address]  [name], MD

## 2021-10-31 ENCOUNTER — Telehealth: Payer: Self-pay | Admitting: Neurology

## 2021-10-31 ENCOUNTER — Inpatient Hospital Stay (HOSPITAL_COMMUNITY)
Admission: EM | Admit: 2021-10-31 | Discharge: 2021-11-02 | DRG: 378 | Disposition: A | Discharge status: Skilled Nursing Facility | Payer: PPO | Source: Skilled Nursing Facility | Attending: Family Medicine | Admitting: Family Medicine

## 2021-10-31 ENCOUNTER — Encounter (HOSPITAL_COMMUNITY): Payer: Self-pay | Admitting: *Deleted

## 2021-10-31 ENCOUNTER — Telehealth: Payer: Self-pay | Admitting: Family Medicine

## 2021-10-31 ENCOUNTER — Telehealth: Payer: Self-pay

## 2021-10-31 DIAGNOSIS — K319 Disease of stomach and duodenum, unspecified: Secondary | ICD-10-CM | POA: Diagnosis not present

## 2021-10-31 DIAGNOSIS — N39498 Other specified urinary incontinence: Secondary | ICD-10-CM | POA: Diagnosis not present

## 2021-10-31 DIAGNOSIS — K5520 Angiodysplasia of colon without hemorrhage: Secondary | ICD-10-CM | POA: Diagnosis not present

## 2021-10-31 DIAGNOSIS — I129 Hypertensive chronic kidney disease with stage 1 through stage 4 chronic kidney disease, or unspecified chronic kidney disease: Secondary | ICD-10-CM | POA: Diagnosis present

## 2021-10-31 DIAGNOSIS — F419 Anxiety disorder, unspecified: Secondary | ICD-10-CM | POA: Diagnosis present

## 2021-10-31 DIAGNOSIS — I2510 Atherosclerotic heart disease of native coronary artery without angina pectoris: Secondary | ICD-10-CM | POA: Diagnosis present

## 2021-10-31 DIAGNOSIS — Z888 Allergy status to other drugs, medicaments and biological substances status: Secondary | ICD-10-CM

## 2021-10-31 DIAGNOSIS — Z8249 Family history of ischemic heart disease and other diseases of the circulatory system: Secondary | ICD-10-CM

## 2021-10-31 DIAGNOSIS — G4700 Insomnia, unspecified: Secondary | ICD-10-CM | POA: Diagnosis not present

## 2021-10-31 DIAGNOSIS — E43 Unspecified severe protein-calorie malnutrition: Secondary | ICD-10-CM | POA: Diagnosis present

## 2021-10-31 DIAGNOSIS — D500 Iron deficiency anemia secondary to blood loss (chronic): Secondary | ICD-10-CM | POA: Diagnosis not present

## 2021-10-31 DIAGNOSIS — K921 Melena: Secondary | ICD-10-CM | POA: Diagnosis not present

## 2021-10-31 DIAGNOSIS — Z8673 Personal history of transient ischemic attack (TIA), and cerebral infarction without residual deficits: Secondary | ICD-10-CM

## 2021-10-31 DIAGNOSIS — Z7401 Bed confinement status: Secondary | ICD-10-CM

## 2021-10-31 DIAGNOSIS — K254 Chronic or unspecified gastric ulcer with hemorrhage: Secondary | ICD-10-CM | POA: Diagnosis not present

## 2021-10-31 DIAGNOSIS — F0390 Unspecified dementia without behavioral disturbance: Secondary | ICD-10-CM | POA: Diagnosis present

## 2021-10-31 DIAGNOSIS — E119 Type 2 diabetes mellitus without complications: Secondary | ICD-10-CM

## 2021-10-31 DIAGNOSIS — D509 Iron deficiency anemia, unspecified: Secondary | ICD-10-CM | POA: Diagnosis not present

## 2021-10-31 DIAGNOSIS — G8114 Spastic hemiplegia affecting left nondominant side: Secondary | ICD-10-CM | POA: Diagnosis not present

## 2021-10-31 DIAGNOSIS — I10 Essential (primary) hypertension: Secondary | ICD-10-CM | POA: Diagnosis not present

## 2021-10-31 DIAGNOSIS — K922 Gastrointestinal hemorrhage, unspecified: Secondary | ICD-10-CM | POA: Diagnosis present

## 2021-10-31 DIAGNOSIS — Z7982 Long term (current) use of aspirin: Secondary | ICD-10-CM | POA: Diagnosis not present

## 2021-10-31 DIAGNOSIS — G4730 Sleep apnea, unspecified: Secondary | ICD-10-CM | POA: Diagnosis not present

## 2021-10-31 DIAGNOSIS — F17210 Nicotine dependence, cigarettes, uncomplicated: Secondary | ICD-10-CM | POA: Diagnosis not present

## 2021-10-31 DIAGNOSIS — D649 Anemia, unspecified: Secondary | ICD-10-CM | POA: Diagnosis not present

## 2021-10-31 DIAGNOSIS — Z20822 Contact with and (suspected) exposure to covid-19: Secondary | ICD-10-CM | POA: Diagnosis not present

## 2021-10-31 DIAGNOSIS — D62 Acute posthemorrhagic anemia: Secondary | ICD-10-CM | POA: Diagnosis not present

## 2021-10-31 DIAGNOSIS — Z83438 Family history of other disorder of lipoprotein metabolism and other lipidemia: Secondary | ICD-10-CM | POA: Diagnosis not present

## 2021-10-31 DIAGNOSIS — E785 Hyperlipidemia, unspecified: Secondary | ICD-10-CM | POA: Diagnosis not present

## 2021-10-31 DIAGNOSIS — I2583 Coronary atherosclerosis due to lipid rich plaque: Secondary | ICD-10-CM | POA: Diagnosis not present

## 2021-10-31 DIAGNOSIS — E039 Hypothyroidism, unspecified: Secondary | ICD-10-CM | POA: Diagnosis present

## 2021-10-31 DIAGNOSIS — Z79890 Hormone replacement therapy (postmenopausal): Secondary | ICD-10-CM

## 2021-10-31 DIAGNOSIS — K219 Gastro-esophageal reflux disease without esophagitis: Secondary | ICD-10-CM | POA: Diagnosis present

## 2021-10-31 DIAGNOSIS — N1832 Chronic kidney disease, stage 3b: Secondary | ICD-10-CM | POA: Diagnosis not present

## 2021-10-31 DIAGNOSIS — I700 Atherosclerosis of aorta: Secondary | ICD-10-CM | POA: Diagnosis not present

## 2021-10-31 DIAGNOSIS — Z79899 Other long term (current) drug therapy: Secondary | ICD-10-CM | POA: Diagnosis not present

## 2021-10-31 DIAGNOSIS — E1122 Type 2 diabetes mellitus with diabetic chronic kidney disease: Secondary | ICD-10-CM | POA: Diagnosis present

## 2021-10-31 DIAGNOSIS — Z8051 Family history of malignant neoplasm of kidney: Secondary | ICD-10-CM

## 2021-10-31 DIAGNOSIS — K259 Gastric ulcer, unspecified as acute or chronic, without hemorrhage or perforation: Secondary | ICD-10-CM | POA: Diagnosis not present

## 2021-10-31 DIAGNOSIS — Z86010 Personal history of colonic polyps: Secondary | ICD-10-CM

## 2021-10-31 DIAGNOSIS — Z91041 Radiographic dye allergy status: Secondary | ICD-10-CM

## 2021-10-31 DIAGNOSIS — Q2739 Arteriovenous malformation, other site: Secondary | ICD-10-CM

## 2021-10-31 DIAGNOSIS — Z90710 Acquired absence of both cervix and uterus: Secondary | ICD-10-CM

## 2021-10-31 DIAGNOSIS — Z823 Family history of stroke: Secondary | ICD-10-CM

## 2021-10-31 DIAGNOSIS — Z833 Family history of diabetes mellitus: Secondary | ICD-10-CM

## 2021-10-31 DIAGNOSIS — K3189 Other diseases of stomach and duodenum: Secondary | ICD-10-CM | POA: Diagnosis not present

## 2021-10-31 LAB — CBC WITH DIFFERENTIAL/PLATELET
Basophils Absolute: 0 10*3/uL (ref 0.0–0.2)
Basos: 1 %
EOS (ABSOLUTE): 0.2 10*3/uL (ref 0.0–0.4)
Eos: 3 %
Hematocrit: 20 % — ABNORMAL LOW (ref 34.0–46.6)
Hemoglobin: 6 g/dL — CL (ref 11.1–15.9)
Immature Grans (Abs): 0 10*3/uL (ref 0.0–0.1)
Immature Granulocytes: 0 %
Lymphocytes Absolute: 1.5 10*3/uL (ref 0.7–3.1)
Lymphs: 23 %
MCH: 27.6 pg (ref 26.6–33.0)
MCHC: 30 g/dL — ABNORMAL LOW (ref 31.5–35.7)
MCV: 92 fL (ref 79–97)
Monocytes Absolute: 0.3 10*3/uL (ref 0.1–0.9)
Monocytes: 5 %
Neutrophils Absolute: 4.4 10*3/uL (ref 1.4–7.0)
Neutrophils: 68 %
Platelets: 443 10*3/uL (ref 150–450)
RBC: 2.17 x10E6/uL — CL (ref 3.77–5.28)
RDW: 14.4 % (ref 11.7–15.4)
WBC: 6.5 10*3/uL (ref 3.4–10.8)

## 2021-10-31 LAB — COMPREHENSIVE METABOLIC PANEL
ALT: 12 IU/L (ref 0–32)
ALT: 13 U/L (ref 0–44)
AST: 18 IU/L (ref 0–40)
AST: 18 U/L (ref 15–41)
Albumin/Globulin Ratio: 1.8 (ref 1.2–2.2)
Albumin: 4 g/dL (ref 3.8–4.8)
Albumin: 3.4 g/dL — ABNORMAL LOW (ref 3.5–5.0)
Alkaline Phosphatase: 80 IU/L (ref 44–121)
Alkaline Phosphatase: 70 U/L (ref 38–126)
Anion gap: 7 (ref 5–15)
BUN/Creatinine Ratio: 15 (ref 12–28)
BUN: 24 mg/dL (ref 8–27)
BUN: 27 mg/dL — ABNORMAL HIGH (ref 8–23)
Bilirubin Total: <0.2 mg/dL (ref 0.0–1.2)
CO2: 21 mmol/L (ref 20–29)
CO2: 23 mmol/L (ref 22–32)
Calcium: 10.6 mg/dL — ABNORMAL HIGH (ref 8.7–10.3)
Calcium: 10.1 mg/dL (ref 8.9–10.3)
Chloride: 110 mmol/L — ABNORMAL HIGH (ref 96–106)
Chloride: 110 mmol/L (ref 98–111)
Creatinine, Ser: 1.62 mg/dL — ABNORMAL HIGH (ref 0.57–1.00)
Creatinine, Ser: 1.43 mg/dL — ABNORMAL HIGH (ref 0.44–1.00)
GFR, Estimated: 40 mL/min — ABNORMAL LOW (ref >60)
Globulin, Total: 2.2 g/dL (ref 1.5–4.5)
Glucose, Bld: 103 mg/dL — ABNORMAL HIGH (ref 70–99)
Glucose: 122 mg/dL — ABNORMAL HIGH (ref 70–99)
Potassium: 4.3 mmol/L (ref 3.5–5.2)
Potassium: 4.3 mmol/L (ref 3.5–5.1)
Sodium: 145 mmol/L — ABNORMAL HIGH (ref 134–144)
Sodium: 140 mmol/L (ref 135–145)
Total Bilirubin: 0.3 mg/dL (ref 0.3–1.2)
Total Protein: 6.2 g/dL (ref 6.0–8.5)
Total Protein: 6.4 g/dL — ABNORMAL LOW (ref 6.5–8.1)
eGFR: 34 mL/min/{1.73_m2} — ABNORMAL LOW (ref >59)

## 2021-10-31 LAB — IRON AND TIBC
Iron Saturation: 18 % (ref 15–55)
Iron: 46 ug/dL (ref 27–139)
Iron: 14 ug/dL — ABNORMAL LOW (ref 28–170)
Saturation Ratios: 5 % — ABNORMAL LOW (ref 10.4–31.8)
TIBC: 286 ug/dL (ref 250–450)
Total Iron Binding Capacity: 252 ug/dL (ref 250–450)
UIBC: 206 ug/dL (ref 118–369)
UIBC: 272 ug/dL

## 2021-10-31 LAB — RESP PANEL BY RT-PCR (FLU A&B, COVID) ARPGX2
Influenza A by PCR: NEGATIVE
Influenza B by PCR: NEGATIVE
SARS Coronavirus 2 by RT PCR: NEGATIVE

## 2021-10-31 LAB — ABO/RH: ABO/RH(D): O NEG

## 2021-10-31 LAB — CBC
HCT: 21.2 % — ABNORMAL LOW (ref 36.0–46.0)
Hemoglobin: 6.2 g/dL — CL (ref 12.0–15.0)
MCH: 27.9 pg (ref 26.0–34.0)
MCHC: 29.2 g/dL — ABNORMAL LOW (ref 30.0–36.0)
MCV: 95.5 fL (ref 80.0–100.0)
Platelets: 418 10*3/uL — ABNORMAL HIGH (ref 150–400)
RBC: 2.22 MIL/uL — ABNORMAL LOW (ref 3.87–5.11)
RDW: 16.1 % — ABNORMAL HIGH (ref 11.5–15.5)
WBC: 7.5 10*3/uL (ref 4.0–10.5)
nRBC: 0 % (ref 0.0–0.2)

## 2021-10-31 LAB — THYROID PANEL WITH TSH
Free Thyroxine Index: 1.5 (ref 1.2–4.9)
T3 Uptake Ratio: 28 % (ref 24–39)
T4, Total: 5.5 ug/dL (ref 4.5–12.0)
TSH: 49.2 u[IU]/mL — ABNORMAL HIGH (ref 0.450–4.500)

## 2021-10-31 LAB — RETICULOCYTES
Immature Retic Fract: 29.5 % — ABNORMAL HIGH (ref 2.3–15.9)
RBC.: 2.16 MIL/uL — ABNORMAL LOW (ref 3.87–5.11)
Retic Count, Absolute: 90.5 10*3/uL (ref 19.0–186.0)
Retic Ct Pct: 4.2 % — ABNORMAL HIGH (ref 0.4–3.1)

## 2021-10-31 LAB — VITAMIN B12
Vitamin B-12: 1628 pg/mL — ABNORMAL HIGH (ref 232–1245)
Vitamin B-12: 1431 pg/mL — ABNORMAL HIGH (ref 180–914)

## 2021-10-31 LAB — PREPARE RBC (CROSSMATCH)

## 2021-10-31 LAB — FERRITIN
Ferritin: 189 ng/mL — ABNORMAL HIGH (ref 15–150)
Ferritin: 97 ng/mL (ref 11–307)

## 2021-10-31 LAB — FOLATE: Folate: 50.4 ng/mL (ref >5.9)

## 2021-10-31 LAB — HIV ANTIBODY (ROUTINE TESTING W REFLEX): HIV Screen 4th Generation wRfx: NONREACTIVE

## 2021-10-31 LAB — CK TOTAL AND CKMB (NOT AT ARMC)
CK-MB Index: 2.3 ng/mL (ref 0.0–5.3)
Total CK: 62 U/L (ref 32–182)

## 2021-10-31 LAB — RPR: RPR Ser Ql: NONREACTIVE

## 2021-10-31 LAB — POC OCCULT BLOOD, ED: Fecal Occult Bld: POSITIVE — AB

## 2021-10-31 MED ORDER — VITAMIN B-12 1000 MCG PO TABS
1000.0000 ug | ORAL_TABLET | Freq: Every day | ORAL | Status: DC
  Administered 2021-10-31 – 2021-11-02 (×2): 1000 ug via ORAL
  Filled 2021-10-31 (×3): qty 1

## 2021-10-31 MED ORDER — SODIUM CHLORIDE 0.9 % IV SOLN
10.0000 mL/h | Freq: Once | INTRAVENOUS | Status: AC
  Administered 2021-11-01: 10 mL/h via INTRAVENOUS

## 2021-10-31 MED ORDER — DIPHENHYDRAMINE HCL 25 MG PO CAPS: 25.0000 mg | ORAL_CAPSULE | Freq: Three times a day (TID) | ORAL | Status: DC | PRN

## 2021-10-31 MED ORDER — THIAMINE HCL 100 MG PO TABS
100.0000 mg | ORAL_TABLET | Freq: Every day | ORAL | Status: DC
  Administered 2021-10-31 – 2021-11-02 (×2): 100 mg via ORAL
  Filled 2021-10-31 (×3): qty 1

## 2021-10-31 MED ORDER — FERROUS SULFATE 325 (65 FE) MG PO TABS
325.0000 mg | ORAL_TABLET | Freq: Every day | ORAL | Status: DC
  Administered 2021-11-02: 325 mg via ORAL
  Filled 2021-10-31 (×2): qty 1

## 2021-10-31 MED ORDER — FOLIC ACID 1 MG PO TABS
1.0000 mg | ORAL_TABLET | Freq: Every day | ORAL | Status: DC
  Administered 2021-10-31 – 2021-11-02 (×2): 1 mg via ORAL
  Filled 2021-10-31 (×3): qty 1

## 2021-10-31 MED ORDER — ALBUTEROL SULFATE (2.5 MG/3ML) 0.083% IN NEBU: 2.5000 mg | INHALATION_SOLUTION | RESPIRATORY_TRACT | Status: DC | PRN

## 2021-10-31 MED ORDER — ZINC SULFATE 220 (50 ZN) MG PO CAPS
220.0000 mg | ORAL_CAPSULE | Freq: Every day | ORAL | Status: DC
  Administered 2021-10-31 – 2021-11-02 (×2): 220 mg via ORAL
  Filled 2021-10-31 (×3): qty 1

## 2021-10-31 MED ORDER — LEVOTHYROXINE SODIUM 25 MCG PO TABS
125.0000 ug | ORAL_TABLET | Freq: Every day | ORAL | Status: DC
  Administered 2021-10-31 – 2021-11-02 (×2): 125 ug via ORAL
  Filled 2021-10-31 (×2): qty 1

## 2021-10-31 MED ORDER — METHOCARBAMOL 1000 MG/10ML IJ SOLN
500.0000 mg | Freq: Four times a day (QID) | INTRAVENOUS | Status: DC | PRN
  Filled 2021-10-31: qty 5

## 2021-10-31 MED ORDER — BISACODYL 5 MG PO TBEC: 5.0000 mg | DELAYED_RELEASE_TABLET | Freq: Every day | ORAL | Status: DC | PRN

## 2021-10-31 MED ORDER — ACETAMINOPHEN 325 MG PO TABS: 650.0000 mg | ORAL_TABLET | Freq: Four times a day (QID) | ORAL | Status: DC | PRN

## 2021-10-31 MED ORDER — ACETAMINOPHEN 650 MG RE SUPP: 650.0000 mg | Freq: Four times a day (QID) | RECTAL | Status: DC | PRN

## 2021-10-31 MED ORDER — LEVOTHYROXINE SODIUM 50 MCG PO TABS: 200.0000 ug | ORAL_TABLET | Freq: Every day | ORAL | Status: DC

## 2021-10-31 MED ORDER — LORATADINE 10 MG PO TABS
10.0000 mg | ORAL_TABLET | Freq: Every day | ORAL | Status: DC
  Administered 2021-10-31 – 2021-11-02 (×2): 10 mg via ORAL
  Filled 2021-10-31 (×3): qty 1

## 2021-10-31 MED ORDER — ROSUVASTATIN CALCIUM 20 MG PO TABS
20.0000 mg | ORAL_TABLET | Freq: Every day | ORAL | Status: DC
  Administered 2021-10-31 – 2021-11-02 (×2): 20 mg via ORAL
  Filled 2021-10-31 (×4): qty 1

## 2021-10-31 MED ORDER — LEVOTHYROXINE SODIUM 125 MCG PO TABS: ORAL_TABLET | ORAL | 1 refills | Status: DC

## 2021-10-31 MED ORDER — PANTOPRAZOLE 80MG IVPB - SIMPLE MED
80.0000 mg | Freq: Two times a day (BID) | INTRAVENOUS | Status: DC
  Administered 2021-11-01: 80 mg via INTRAVENOUS
  Filled 2021-10-31 (×5): qty 100

## 2021-10-31 MED ORDER — TRAZODONE HCL 50 MG PO TABS
25.0000 mg | ORAL_TABLET | Freq: Every day | ORAL | Status: DC
  Administered 2021-10-31 – 2021-11-01 (×2): 25 mg via ORAL
  Filled 2021-10-31 (×2): qty 1

## 2021-10-31 MED ORDER — MAGNESIUM OXIDE -MG SUPPLEMENT 400 (240 MG) MG PO TABS
200.0000 mg | ORAL_TABLET | Freq: Two times a day (BID) | ORAL | Status: DC
  Administered 2021-10-31 – 2021-11-02 (×3): 200 mg via ORAL
  Filled 2021-10-31 (×4): qty 1

## 2021-10-31 MED ORDER — TOPIRAMATE 25 MG PO TABS
50.0000 mg | ORAL_TABLET | Freq: Two times a day (BID) | ORAL | Status: DC
  Administered 2021-10-31 – 2021-11-02 (×3): 50 mg via ORAL
  Filled 2021-10-31 (×4): qty 2

## 2021-10-31 MED ORDER — ENSURE ENLIVE PO LIQD
237.0000 mL | Freq: Three times a day (TID) | ORAL | Status: DC
  Administered 2021-10-31 – 2021-11-02 (×3): 237 mL via ORAL
  Filled 2021-10-31 (×2): qty 237

## 2021-10-31 MED ORDER — ADULT MULTIVITAMIN W/MINERALS CH
1.0000 | ORAL_TABLET | Freq: Every day | ORAL | Status: DC
  Administered 2021-10-31 – 2021-11-02 (×2): 1 via ORAL
  Filled 2021-10-31 (×3): qty 1

## 2021-10-31 MED ORDER — DULOXETINE HCL 60 MG PO CPEP
60.0000 mg | ORAL_CAPSULE | Freq: Every day | ORAL | Status: DC
  Administered 2021-10-31 – 2021-11-02 (×3): 60 mg via ORAL
  Filled 2021-10-31 (×3): qty 1

## 2021-10-31 MED ORDER — OXYCODONE HCL 5 MG PO TABS: 5.0000 mg | ORAL_TABLET | ORAL | Status: DC | PRN

## 2021-10-31 MED ORDER — ASCORBIC ACID 500 MG PO TABS
500.0000 mg | ORAL_TABLET | Freq: Every day | ORAL | Status: DC
  Administered 2021-10-31 – 2021-11-02 (×2): 500 mg via ORAL
  Filled 2021-10-31 (×3): qty 1

## 2021-10-31 NOTE — ED Notes (Signed)
Daughter updated on patient plan of care at this time and telephone verbal consent to blood transfusion.

## 2021-10-31 NOTE — ED Notes (Signed)
2 failed IV attempts at this time.

## 2021-10-31 NOTE — Assessment & Plan Note (Signed)
Likely GI bleed given (+) hemoccult, will transfuse, monitor CBC, consult to GI, pt is NPO p midnight

## 2021-10-31 NOTE — Telephone Encounter (Signed)
I called patient, spoke with Joanna [name], patient's caregiver at her facility. Joanna [name] reports that she has already been called by Dr. Lance Sell office regarding these results and was instructed to take patient to the ER. I asked Joanna [name] to call us back with questions or concerns.

## 2021-10-31 NOTE — Progress Notes (Signed)
UNMATCHED BLOOD PRODUCT NOTE  Compare the patient ID on the blood tag to the patient ID on the hospital armband and Blood Bank armband. Then confirm the unit number on the blood tag matches the unit number on the blood product.  If a discrepancy is discovered return the product to blood bank immediately.   Blood Product Type: Packed Red Blood Cells  Unit #: (Found on blood product bag, begins with W) [id number]  Product Code #: (Found on blood product bag, begins with E) [id number]   Start Time: 2155  Starting Rate: 120  ml/hr  Rate increase/decreased  (if applicable):      ml/hr  Rate changed time (if applicable):    Stop Time:    Blood bank called and blood product verified with lab about D antigen.  Blood product verified  by Lawernce Keas Rn and Cyndra Numbers RN.   All Other Documentation should be documented within the Blood Admin Flowsheet per policy.

## 2021-10-31 NOTE — Telephone Encounter (Signed)
Nurses Patient recently had lab work drawn through Dr.Yan-neurology  Hemoglobin very low at critical level 6.0 This is low enough to cause a severe issue Recommend ER because patient will need transfusion of 1 to 2 units of blood and further evaluation  Also TSH dramatically elevated recommend increasing levothyroxine new dose 125 mcg 1 every morning 30 minutes before meal on empty stomach with water #90 with 1 refill  Has follow-up office visit on February 1 but potentially we may move this up after patient goes to ER

## 2021-10-31 NOTE — Telephone Encounter (Addendum)
Joanna [name](DPR) notified per Dr Nicki Reaper: Patient recently had lab work drawn through Dr.Yan-neurology   Hemoglobin very low at critical level 6.0 This is low enough to cause a severe issue Recommend ER because patient will need transfusion of 1 to 2 units of blood and further evaluation   Also TSH dramatically elevated recommend increasing levothyroxine new dose 125 mcg 1 every morning 30 minutes before meal on empty stomach with water #90 with 1 refill   Has follow-up office visit on February 1 but potentially we may move this up after patient goes to [place] verbalized understanding and will head to [facility] ER- Prescription sent electronically to pharmacy.  [facility] ER triage nurse notified the patent was headed to ER

## 2021-10-31 NOTE — Telephone Encounter (Signed)
Received charge sheet and signed patient consent (given to medical records) for 600 units of Xeomin (H2929) for G81.14 (CPT [id number], [id number], [id number], [id number], [id number], [phone], [phone]).   Called patient, she is scheduled for her first injection on 2/8 @ 3:45.

## 2021-10-31 NOTE — Telephone Encounter (Signed)
Dinkins, Cyril Mourning, RN   10:49 AM Note today I called patient, spoke with Levie Heritage, patient's caregiver at her facility. [name] reports that she has already been called by Dr. Lance Sell office regarding these results and was instructed to take patient to the ER. I asked [name] to call us back with questions or concerns.

## 2021-10-31 NOTE — ED Notes (Signed)
Per lab patient has antibodies in her type and screen and blood transfusion would be delayed a while due to having to find blood at other [facility] facilities.

## 2021-10-31 NOTE — Progress Notes (Signed)
Patient has electronic consent in chart.  Unable to be printed at this time.

## 2021-10-31 NOTE — ED Notes (Signed)
Attempted to toilet patient; patient denies toileting needs at this time. Patient is dry.

## 2021-10-31 NOTE — Telephone Encounter (Signed)
Our billing department received a message from [place] asking for a call back to discuss a critical lab value.  I called [facility], spoke with Collie Siad.  The patient's hemoglobin was 6.0.  The patient's RBC is 2.17.  I will forward to Dr. Krista Blue for further review.

## 2021-10-31 NOTE — ED Provider Notes (Signed)
[facility] Provider Note   CSN: [id number] Arrival date & time: 10/31/21  1436     History  Chief Complaint  Patient presents with   Abnormal Lab    Joanna [name] is a 71 y.o. female.   Abnormal Lab Patient presents for low hemoglobin.  Medical history is notable for DM, hypothyroidism, HLD, CKD.  She had recent lab work and hemoglobin was found be 6.0.  TSH was also significantly elevated.  She is on Synthroid.  Pressure review, she has been diagnosed with an AVM and small bowel.  She resides in a skilled nursing facility.  She has been told that she had CVA in the past.  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  She is accompanied in the ED by her nurse caregiver.  Patient is alert and oriented x1, which caregiver states is baseline.  Caregivers not aware of any recent bleeding.  She has not had any visualization of recent stools.  Patient denies any physical complaints at this time.    Home Medications Prior to Admission medications   Medication Sig Start Date End Date Taking? Authorizing Provider  acetaminophen (TYLENOL) 500 MG tablet Take 1,000 mg by mouth every 8 (eight) hours as needed for moderate pain.   Yes [provider]  albuterol (PROVENTIL HFA;VENTOLIN HFA) 108 (90 Base) MCG/ACT inhaler Inhale 2 puffs into the lungs every 4 (four) hours as needed for wheezing or shortness of breath. 12/25/18  Yes [name], MD  ascorbic acid (VITAMIN C) 500 MG tablet Take 1 tablet (500 mg total) by mouth daily. 07/19/21  Yes Antonieta Pert, MD  aspirin EC 81 MG EC tablet Take 1 tablet (81 mg total) by mouth daily. Swallow whole. 07/19/21  Yes Antonieta Pert, MD  DULoxetine (CYMBALTA) 60 MG capsule Take 1 capsule (60 mg total) by mouth daily. 10/27/21  Yes Marcial Pacas, MD  famotidine (PEPCID) 20 MG tablet Take 1 tablet (20 mg total) by mouth 2 (two) times daily. 06/28/21 06/28/22 Yes Ghimire, Dante Gang, MD  ferrous sulfate 325  (65 FE) MG tablet Take 325 mg by mouth daily with breakfast.   Yes [provider]  fexofenadine (ALLEGRA) 180 MG tablet Take 180 mg by mouth daily as needed for allergies.   Yes [provider]  levothyroxine (SYNTHROID) 100 MCG tablet Take 100 mcg by mouth daily before breakfast.   Yes [provider]  Multiple Vitamin (MULTIVITAMIN WITH MINERALS) TABS tablet Take 1 tablet by mouth daily. 07/26/21  Yes Elodia Florence., MD  rosuvastatin (CRESTOR) 20 MG tablet Take 1 tablet by mouth daily (Stop Pravastatin) Patient taking differently: Take 20 mg by mouth daily. 01/19/21  Yes [name], MD  topiramate (TOPAMAX) 50 MG tablet Take 1 tablet (50 mg total) by mouth 2 (two) times daily. 04/29/21  Yes [name], [name], MD  vitamin B-12 (CYANOCOBALAMIN) 500 MCG tablet Take 1,000 mcg by mouth daily.   Yes [provider]  zinc sulfate 220 (50 Zn) MG capsule Take 1 capsule (220 mg total) by mouth daily. 07/19/21  Yes Antonieta Pert, MD  diphenhydrAMINE (BENADRYL) 25 mg capsule Take 1 capsule (25 mg total) by mouth every 8 (eight) hours as needed for itching or allergies. Patient not taking: Reported on 11/01/2021 06/28/21   Barb Merino, MD  folic acid (FOLVITE) 1 MG tablet TAKE 1 TABLET(1 MG) BY MOUTH DAILY Patient not taking: Reported on 11/01/2021 08/04/21  [name], MD  levothyroxine (SYNTHROID) 125 MCG tablet 1 every morning 30 minutes before meal on empty stomach with water Patient not taking: Reported on 11/01/2021 10/31/21   [name], MD  Magnesium 250 MG TABS Take 1 tablet (250 mg total) by mouth 2 (two) times daily. Patient not taking: Reported on 11/01/2021 06/28/21   Barb Merino, MD  Nutritional Supplements (ENSURE PLUS HIGH PROTEIN) LIQD Take 237 mLs by mouth 3 (three) times daily. Patient not taking: Reported on 11/01/2021 07/25/21   Elodia Florence., MD  thiamine 100 MG tablet Take 1 tablet (100 mg total) by mouth daily. Patient  not taking: Reported on 11/01/2021 07/19/21   Antonieta Pert, MD  traZODone (DESYREL) 50 MG tablet TAKE 1/2 TO 1 TABLET BY MOUTH EVERY NIGHT FOR SLEEP Patient not taking: Reported on 11/01/2021 01/19/21   [name], MD      Allergies    Zoloft [sertraline hcl] and Ivp dye [iodinated contrast media]    Review of Systems   Review of Systems  Unable to perform ROS: Dementia   Physical Exam Updated Vital Signs BP (!) 129/94 (BP Location: Right Arm)    Pulse (!) 101    Temp 98.2 F (36.8 C) (Oral)    Resp 19    Ht 5\' 2"  (1.575 m)    Wt 49.1 kg    SpO2 99%    BMI 19.80 kg/m  Physical Exam Vitals and nursing note reviewed.  Constitutional:      General: She is not in acute distress.    Appearance: She is well-developed. She is not ill-appearing, toxic-appearing or diaphoretic.  HENT:     Head: Normocephalic and atraumatic.     Right Ear: External ear normal.     Left Ear: External ear normal.     Nose: Nose normal.     Mouth/Throat:     Mouth: Mucous membranes are moist.     Pharynx: Oropharynx is clear.  Eyes:     General: No scleral icterus.    Extraocular Movements: Extraocular movements intact.     Conjunctiva/sclera: Conjunctivae normal.     Comments: Conjunctival pallor present  Cardiovascular:     Rate and Rhythm: Normal rate and regular rhythm.     Heart sounds: No murmur heard. Pulmonary:     Effort: Pulmonary effort is normal. No respiratory distress.     Breath sounds: Normal breath sounds. No wheezing or rales.  Abdominal:     Palpations: Abdomen is soft.     Tenderness: There is no abdominal tenderness.  Musculoskeletal:        General: No swelling or tenderness.     Cervical back: Neck supple.     Comments: Chronic left arm contractures  Skin:    General: Skin is warm and dry.     Capillary Refill: Capillary refill takes less than 2 seconds.     Coloration: Skin is not jaundiced.     Findings: No bruising.  Neurological:     Mental Status: She is alert.  Mental status is at baseline.     Comments: Chronic weakness in all extremities  Psychiatric:        Mood and Affect: Mood normal.    ED Results / Procedures / Treatments   Labs (all labs ordered are listed, but only abnormal results are displayed) Labs Reviewed  COMPREHENSIVE METABOLIC PANEL - Abnormal; Notable for the following components:      Result Value   Glucose, Bld 103 (*)  BUN 27 (*)    Creatinine, Ser 1.43 (*)    Total Protein 6.4 (*)    Albumin 3.4 (*)    GFR, Estimated 40 (*)    All other components within normal limits  CBC - Abnormal; Notable for the following components:   RBC 2.22 (*)    Hemoglobin 6.2 (*)    HCT 21.2 (*)    MCHC 29.2 (*)    RDW 16.1 (*)    Platelets 418 (*)    All other components within normal limits  VITAMIN B12 - Abnormal; Notable for the following components:   Vitamin B-12 1,431 (*)    All other components within normal limits  IRON AND TIBC - Abnormal; Notable for the following components:   Iron 14 (*)    Saturation Ratios 5 (*)    All other components within normal limits  RETICULOCYTES - Abnormal; Notable for the following components:   Retic Ct Pct 4.2 (*)    RBC. 2.16 (*)    Immature Retic Fract 29.5 (*)    All other components within normal limits  BASIC METABOLIC PANEL - Abnormal; Notable for the following components:   CO2 21 (*)    BUN 29 (*)    Creatinine, Ser 1.27 (*)    GFR, Estimated 46 (*)    All other components within normal limits  CBC - Abnormal; Notable for the following components:   RBC 3.47 (*)    Hemoglobin 9.9 (*)    HCT 31.7 (*)    RDW 17.2 (*)    All other components within normal limits  POC OCCULT BLOOD, ED - Abnormal; Notable for the following components:   Fecal Occult Bld POSITIVE (*)    All other components within normal limits  RESP PANEL BY RT-PCR (FLU A&B, COVID) ARPGX2  FOLATE  FERRITIN  GLUCOSE, CAPILLARY  CBC  BASIC METABOLIC PANEL  TYPE AND SCREEN  ABO/RH  PREPARE RBC  (CROSSMATCH)  SURGICAL PATHOLOGY    EKG None  Radiology No results found.  Procedures Procedures    Medications Ordered in ED Medications  albuterol (PROVENTIL) (2.5 MG/3ML) 0.083% nebulizer solution 2.5 mg ( Inhalation MAR Unhold 11/01/21 1647)  ascorbic acid (VITAMIN C) tablet 500 mg ( Oral MAR Unhold 11/01/21 1647)  zinc sulfate capsule 220 mg ( Oral MAR Unhold 11/01/21 1647)  vitamin B-12 (CYANOCOBALAMIN) tablet 1,000 mcg ( Oral MAR Unhold 11/01/21 1647)  traZODone (DESYREL) tablet 25 mg (25 mg Oral Given 11/01/21 2154)  topiramate (TOPAMAX) tablet 50 mg (50 mg Oral Given 11/01/21 2155)  thiamine tablet 100 mg ( Oral MAR Unhold 11/01/21 1647)  rosuvastatin (CRESTOR) tablet 20 mg ( Oral MAR Unhold 11/01/21 1647)  feeding supplement (ENSURE ENLIVE / ENSURE PLUS) liquid 237 mL (237 mLs Oral Given 11/01/21 2153)  multivitamin with minerals tablet 1 tablet ( Oral MAR Unhold 11/01/21 1647)  magnesium oxide (MAG-OX) tablet 200 mg (200 mg Oral Given 1/49/70 2637)  folic acid (FOLVITE) tablet 1 mg ( Oral MAR Unhold 11/01/21 1647)  loratadine (CLARITIN) tablet 10 mg ( Oral MAR Unhold 11/01/21 1647)  ferrous sulfate tablet 325 mg ( Oral MAR Unhold 11/01/21 1647)  DULoxetine (CYMBALTA) DR capsule 60 mg ( Oral MAR Unhold 11/01/21 1647)  diphenhydrAMINE (BENADRYL) capsule 25 mg ( Oral MAR Unhold 11/01/21 1647)  acetaminophen (TYLENOL) tablet 650 mg ( Oral MAR Unhold 11/01/21 1647)    Or  acetaminophen (TYLENOL) suppository 650 mg ( Rectal MAR Unhold 11/01/21 1647)  oxyCODONE (Oxy IR/ROXICODONE) immediate release  tablet 5 mg ( Oral MAR Unhold 11/01/21 1647)  methocarbamol (ROBAXIN) 500 mg in dextrose 5 % 50 mL IVPB ( Intravenous MAR Unhold 11/01/21 1647)  bisacodyl (DULCOLAX) EC tablet 5 mg ( Oral MAR Unhold 11/01/21 1647)  levothyroxine (SYNTHROID) tablet 125 mcg ( Oral MAR Unhold 11/01/21 1647)  pantoprazole (PROTONIX) injection 40 mg (40 mg Intravenous Given 11/01/21 2154)  0.9 %  sodium chloride infusion  (10 mL/hr Intravenous New Bag/Given 11/01/21 0030)    ED Course/ Medical Decision Making/ A&P                           Medical Decision Making Amount and/or Complexity of Data Reviewed Labs: ordered.  Risk OTC drugs. Prescription drug management. Decision regarding hospitalization.  This patient presents to the ED for concern of acute anemia, this involves an extensive number of treatment options, and is a complaint that carries with it a high risk of complications and morbidity.  The differential diagnosis includes GI bleed, intravascular hemolysis, epistaxis.   Co morbidities that complicate the patient evaluation  DM, hypothyroidism, HLD, CKD, extremity weakness, extremity contractures, cognitive decline, bedbound status   Additional history obtained:  Additional history obtained from patient's caregiver and patient's family members External records from outside source obtained and reviewed including EMR   Lab Tests:  I Ordered, and personally interpreted labs.  The pertinent results include: Acute on chronic anemia with hemoglobin of 6.2; no leukocytosis, mildly elevated platelets; baseline CKD, baseline BUN; fecal occult blood positive consistent with GI source of blood loss   Imaging Studies ordered:  I ordered imaging studies including none I independently visualized and interpreted imaging which showed N/A   Cardiac Monitoring:  The patient was maintained on a cardiac monitor.  I personally viewed and interpreted the cardiac monitored which showed an underlying rhythm of: Sinus rhythm   Medicines ordered and prescription drug management:  I ordered medication including PRBCs for acute anemia   Critical Interventions:  Transfusion of PRBCs for acute on chronic anemia.  Problem List / ED Course:  Patient presents for low hemoglobin.  Medical history is notable for DM, hypothyroidism, HLD, CKD.  She is bedbound with chronic extremity weakness and  contractures of her left arm.  She presents to the ED from a skilled nursing facility.  She had recent lab work and hemoglobin was found be 6.0.  Per chart review, baseline hemoglobin appears to be in the range of 8.  On recent lab work, TSH was also significantly elevated.  She is on Synthroid.  Pressure review, she has been diagnosed with an AVM and small bowel in the past.  On arrival in the ED, mildly tachycardic at 109.  Blood pressure is normal.  On exam, patient is resting comfortably.  She is normotensive on bedside monitor.  Patient is alert and oriented x1.  Shortly after her arrival, her nursing caregiver arrived and states that patient is currently at her mental baseline.  The extremity weakness is also baseline for her.  I spoke with multiple family members to obtain consent for blood transfusion.  Family members were not able to offer much more history, although several of them work at the nursing facility where the patient resides.  Patient's acute on chronic anemia was confirmed on lab work in the ED.  On DRE, melena was present and this was confirmed with Hemoccult testing.  This is likely the source of the patient's blood loss.  PRBC transfusion  was ordered.  I was informed that patient will likely require transport of blood from another facility by courier due to cross reaction and blood bank.  Patient remained hemodynamically stable.  Patient was admitted to hospitalist for further management.   Reevaluation:  After the interventions noted above, I reevaluated the patient and found that they have :stayed the same   Social Determinants of Health:  Patient has severe left upper and bilateral lower extremity weakness.  She is bedbound and does require full-time nursing care.  She also has dementia.  All of these things have severe negative impact on social determinants of health.  It does appear that she has multiple family numbers who are involved in her care.   Dispostion:  After  consideration of the diagnostic results and the patients response to treatment, I feel that the patent would benefit from admission to hospital.           Final Clinical Impression(s) / ED Diagnoses Final diagnoses:  Low hemoglobin    Rx / DC Orders ED Discharge Orders     None         Godfrey Pick, MD 11/02/21 [phone]

## 2021-10-31 NOTE — Progress Notes (Signed)
Blood product transfusing 15 minute interval, no reaction noted.

## 2021-10-31 NOTE — ED Triage Notes (Signed)
Referred by pcp for low hemoglobin

## 2021-10-31 NOTE — H&P (Signed)
Joanna [name] is an 71 y.o. female.   Chief Complaint:   Chief Complaint  Patient presents with   Abnormal Lab    HPI:  Patient presents from SNF where she resides, concern for low hemoglobin 6.0 on routine labs from neurology where she is getting botulinum treatments for spasciticity. Medical history is notable for DM, hypothyroidism, HLD, CKD. TSH was also significantly elevated. On problem list, she has been diagnosed with an AVM of small bowel.  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  She is accompanied in the ED by her nurse caregiver who had left by the time I saw patient.  Patient is alert and oriented to person only which caregiver reported as baseline. Per ER attending, caregivers/family not aware of any recent bleeding and she has not had any visualization of recent stools. Patient denies pain, shortness of breath, dizziness, new weakness.   HOSPITAL COURSE ED COURSE & DATE OF ADMISSION 10/31/21: From SNF w/ routine labs showing Hgb 6.0 no other complaints. In ED Hgb 6.2, ordered to transfuse (waiting on blood as of time of admission). Hemoccult (+). RDW slight up at 16.1. Plt slight up at 418. On CMP stable CKD3, stable Glc, low protein and albumin but these also stable. On record review, EGD 02/2020 no concers, Colonoscopy 05/2019 "ileum was normal. One 6 mm polyp in the rectum. MODERATE Diverticulosis in the entire examined colon. External and internal hemorrhoids." Capsule endoscopy 08/2015.     ASSESSMENT/PLAN:   Blood loss anemia Likely GI bleed given (+) hemoccult, will transfuse, monitor CBC, consult to GI, pt is NPO p midnight, Protonix IV   CKD stage IIIb -Baseline creatinine 1.3-1.5 -Monitor BMP   Hypothyroidism -Continue Synthroid   Hyperlipidemia -Continue statin   Chronic left upper extremity spastic  weakness -PT/OT as needed   Essential hypertension -Previously on HCTZ/ARB which has been discontinued -BPs remain acceptable -Follow clinically   Goals of care -Full scope of treatment     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 [facility]   Bilateral headaches    Depression    Diabetes mellitus    Diverticulosis    Dyslipidemia    GERD (gastroesophageal reflux disease)    Hypertension    Insomnia    Microproteinuria    Renal insufficiency 2010   Right carpal tunnel syndrome 12/24/2017   Sleep apnea    Sleep apnea 12/25/2017   Abnormal sleep study February 2019 CPAP ordered   Thyroid disease    hypothyroid    Past Surgical History:  Procedure Laterality Date   ABDOMINAL HYSTERECTOMY  1984   BACK SURGERY     BILATERAL OOPHORECTOMY     BIOPSY  02/19/2020   Procedure: BIOPSY;  Surgeon: [name], MD;  Location: [facility];  Service: Endoscopy;;  gastric   CERVICAL DISC SURGERY  01/16/2019   COLONOSCOPY  01/29/12   Fields-2-3 hyperplastic polyps, mod internal hemorrhoids, diverticulosis throughout colon   COLONOSCOPY N/A 07/03/2014   Dr. Oneida Alar: Hyperplastic polyps   COLONOSCOPY N/A 06/06/2019   Dr. Oneida Alar: Diverticulosis, hemorrhoids, single polyp removed, pathology revealed lipoma.  Next colonoscopy 10 years   ESOPHAGOGASTRODUODENOSCOPY   01/29/2012   Fields-2-3cm sliding HH, fundic gland polyps, NEGATIVE H pylori   ESOPHAGOGASTRODUODENOSCOPY N/A 07/03/2014  Dr. Oneida Alar: gastritis and normal small bowel biopsies   ESOPHAGOGASTRODUODENOSCOPY N/A 02/19/2020   Fields: hypertrophic folds in the stomach. benign polyp removed   [facility] CAPSULE STUDY N/A 10/26/2014   SMALL BOWEL AVMs   KNEE ARTHROSCOPY Right    POLYPECTOMY  06/06/2019   Procedure: POLYPECTOMY;  Surgeon: [name], MD;  Location: [facility];  Service: Endoscopy;;   TOTAL ABDOMINAL HYSTERECTOMY W/ BILATERAL SALPINGOOPHORECTOMY       Family History  Problem Relation Age of Onset   Cardiomyopathy Father    Heart failure Mother    Hyperlipidemia Mother    Stroke Brother    Kidney cancer Brother    Diabetes Paternal Uncle    Heart disease Paternal Grandmother    Colon cancer Neg Hx    Colon polyps Neg Hx    Social History:  reports that she has been smoking cigarettes. She has a 7.50 pack-year smoking history. She quit smokeless tobacco use about 11 years ago. She reports current alcohol use. She reports that she does not use drugs.  Allergies:  Allergies  Allergen Reactions   Zoloft [Sertraline Hcl] Other (See Comments)    Made the patient feel withdrawn and had bad dreams- "Allergic," per MAR   Ivp Dye [Iodinated Contrast Media] Rash and Other (See Comments)    "Allergic," per [facility]    (Not in a hospital admission)   Results for orders placed or performed during the hospital encounter of 10/31/21 (from the past 48 hour(s))  Comprehensive metabolic panel     Status: Abnormal   Collection Time: 10/31/21  3:11 PM  Result Value Ref Range   Sodium 140 135 - 145 mmol/L   Potassium 4.3 3.5 - 5.1 mmol/L   Chloride 110 98 - 111 mmol/L   CO2 23 22 - 32 mmol/L   Glucose, Bld 103 (H) 70 - 99 mg/dL    Comment: Glucose reference range applies only to samples taken after fasting for at least 8 hours.   BUN 27 (H) 8 - 23 mg/dL   Creatinine, Ser 1.43 (H) 0.44 - 1.00 mg/dL   Calcium 10.1 8.9 - 10.3 mg/dL   Total Protein 6.4 (L) 6.5 - 8.1 g/dL   Albumin 3.4 (L) 3.5 - 5.0 g/dL   AST 18 15 - 41 U/L   ALT 13 0 - 44 U/L   Alkaline Phosphatase 70 38 - 126 U/L   Total Bilirubin 0.3 0.3 - 1.2 mg/dL   GFR, Estimated 40 (L) >60 mL/min    Comment: (NOTE) Calculated using the CKD-EPI Creatinine Equation (2021)    Anion gap 7 5 - 15    Comment: Performed at [facility], [address], [place], [place] [address]  CBC     Status: Abnormal   Collection Time: 10/31/21  3:11 PM  Result Value Ref Range   WBC 7.5 4.0 - 10.5  K/uL   RBC 2.22 (L) 3.87 - 5.11 MIL/uL   Hemoglobin 6.2 (LL) 12.0 - 15.0 g/dL    Comment: This critical result has verified and been called to [place] by Joaquin Courts on 01 16 2023 at 1531, and has been read back.    HCT 21.2 (L) 36.0 - 46.0 %   MCV 95.5 80.0 - 100.0 fL   MCH 27.9 26.0 - 34.0 pg   MCHC 29.2 (L) 30.0 - 36.0 g/dL   RDW 16.1 (H) 11.5 - 15.5 %   Platelets 418 (H) 150 - 400 K/uL   nRBC 0.0  0.0 - 0.2 %    Comment: Performed at [facility], [address], [place], [place] [address]  Type and screen [facility]     Status: None   Collection Time: 10/31/21  3:11 PM  Result Value Ref Range   ABO/RH(D) O NEG    Antibody Screen POS    Sample Expiration      11/03/2021,2359 Performed at [facility], [address], [place], [place] [address]   POC occult blood, ED     Status: Abnormal   Collection Time: 10/31/21  4:18 PM  Result Value Ref Range   Fecal Occult Bld POSITIVE (A) NEGATIVE  ABO/Rh     Status: None   Collection Time: 10/31/21  4:32 PM  Result Value Ref Range   ABO/RH(D)      Jenetta Downer NEG Performed at [facility], [address] [place], [place] [address]   Prepare RBC (crossmatch)     Status: None   Collection Time: 10/31/21  5:19 PM  Result Value Ref Range   Order Confirmation      ORDER PROCESSED BY BLOOD BANK Performed at [facility], [address], [place], [place] [address]    No results found.  Review of Systems  Constitutional:  Positive for fatigue. Negative for chills.  Respiratory:  Negative for cough, choking and chest tightness.   Cardiovascular:  Negative for chest pain.  Genitourinary:  Negative for dysuria.   Blood pressure 126/70, pulse 88, temperature 98.3 F (36.8 C), temperature source Oral, resp. rate 16, SpO2 100 %. Physical Exam Constitutional:      General: She is not in acute distress. Cardiovascular:     Rate and Rhythm: Normal rate and regular rhythm.  Pulmonary:     Effort: Pulmonary effort is normal.      Breath sounds: Normal breath sounds.  Neurological:     Mental Status: She is alert.      Emeterio Reeve, DO 10/31/2021, 5:28 PM

## 2021-10-31 NOTE — Telephone Encounter (Signed)
Please call patient's facility, laboratory evaluation showed further decrease of hemoglobin, only 6,  Abnormal kidney function, creatinine 1.62, calcium was elevated 10.6, significantly elevated TSH,  Order lab result to her primary care physician Dr. Wolfgang Phoenix, [name], MD   This should contact him for further evaluation, may even consider taking her to emergency room for evaluation if there noticeable sudden change for patient,

## 2021-11-01 ENCOUNTER — Encounter (HOSPITAL_COMMUNITY)
Admission: EM | Disposition: A | Discharge status: Skilled Nursing Facility | Payer: Self-pay | Source: Skilled Nursing Facility | Attending: Internal Medicine

## 2021-11-01 ENCOUNTER — Inpatient Hospital Stay (HOSPITAL_COMMUNITY): Payer: PPO | Admitting: Anesthesiology

## 2021-11-01 ENCOUNTER — Other Ambulatory Visit: Payer: Self-pay

## 2021-11-01 ENCOUNTER — Encounter (HOSPITAL_COMMUNITY): Payer: Self-pay | Admitting: Osteopathic Medicine

## 2021-11-01 DIAGNOSIS — I10 Essential (primary) hypertension: Secondary | ICD-10-CM

## 2021-11-01 DIAGNOSIS — G8114 Spastic hemiplegia affecting left nondominant side: Secondary | ICD-10-CM

## 2021-11-01 DIAGNOSIS — K921 Melena: Principal | ICD-10-CM

## 2021-11-01 DIAGNOSIS — K259 Gastric ulcer, unspecified as acute or chronic, without hemorrhage or perforation: Secondary | ICD-10-CM

## 2021-11-01 DIAGNOSIS — K3189 Other diseases of stomach and duodenum: Secondary | ICD-10-CM

## 2021-11-01 DIAGNOSIS — D500 Iron deficiency anemia secondary to blood loss (chronic): Secondary | ICD-10-CM

## 2021-11-01 DIAGNOSIS — D62 Acute posthemorrhagic anemia: Secondary | ICD-10-CM

## 2021-11-01 DIAGNOSIS — D509 Iron deficiency anemia, unspecified: Secondary | ICD-10-CM

## 2021-11-01 DIAGNOSIS — N1832 Chronic kidney disease, stage 3b: Secondary | ICD-10-CM

## 2021-11-01 HISTORY — PX: SMALL BOWEL ENTEROSCOPY: SHX2415

## 2021-11-01 HISTORY — PX: ESOPHAGOGASTRODUODENOSCOPY (EGD) WITH PROPOFOL: SHX5813

## 2021-11-01 HISTORY — PX: BIOPSY: SHX5522

## 2021-11-01 HISTORY — PX: ENTEROSCOPY: SHX5533

## 2021-11-01 LAB — BASIC METABOLIC PANEL
Anion gap: 9 (ref 5–15)
BUN: 29 mg/dL — ABNORMAL HIGH (ref 8–23)
CO2: 21 mmol/L — ABNORMAL LOW (ref 22–32)
Calcium: 9.7 mg/dL (ref 8.9–10.3)
Chloride: 108 mmol/L (ref 98–111)
Creatinine, Ser: 1.27 mg/dL — ABNORMAL HIGH (ref 0.44–1.00)
GFR, Estimated: 46 mL/min — ABNORMAL LOW (ref >60)
Glucose, Bld: 93 mg/dL (ref 70–99)
Potassium: 4.3 mmol/L (ref 3.5–5.1)
Sodium: 138 mmol/L (ref 135–145)

## 2021-11-01 LAB — CBC
HCT: 31.7 % — ABNORMAL LOW (ref 36.0–46.0)
Hemoglobin: 9.9 g/dL — ABNORMAL LOW (ref 12.0–15.0)
MCH: 28.5 pg (ref 26.0–34.0)
MCHC: 31.2 g/dL (ref 30.0–36.0)
MCV: 91.4 fL (ref 80.0–100.0)
Platelets: 301 10*3/uL (ref 150–400)
RBC: 3.47 MIL/uL — ABNORMAL LOW (ref 3.87–5.11)
RDW: 17.2 % — ABNORMAL HIGH (ref 11.5–15.5)
WBC: 7.8 10*3/uL (ref 4.0–10.5)
nRBC: 0 % (ref 0.0–0.2)

## 2021-11-01 LAB — GLUCOSE, CAPILLARY: Glucose-Capillary: 82 mg/dL (ref 70–99)

## 2021-11-01 SURGERY — ESOPHAGOGASTRODUODENOSCOPY (EGD) WITH PROPOFOL
Anesthesia: General

## 2021-11-01 MED ORDER — LIDOCAINE HCL (CARDIAC) PF 100 MG/5ML IV SOSY
PREFILLED_SYRINGE | INTRAVENOUS | Status: DC | PRN
  Administered 2021-11-01: 60 mg via INTRAVENOUS

## 2021-11-01 MED ORDER — SODIUM CHLORIDE 0.9 % IV SOLN: INTRAVENOUS | Status: DC

## 2021-11-01 MED ORDER — PANTOPRAZOLE SODIUM 40 MG IV SOLR
40.0000 mg | Freq: Two times a day (BID) | INTRAVENOUS | Status: DC
  Administered 2021-11-01 – 2021-11-02 (×3): 40 mg via INTRAVENOUS
  Filled 2021-11-01 (×3): qty 40

## 2021-11-01 MED ORDER — PROPOFOL 500 MG/50ML IV EMUL
INTRAVENOUS | Status: DC | PRN
  Administered 2021-11-01: 125 ug/kg/min via INTRAVENOUS
  Administered 2021-11-01: 60 ug/kg/min via INTRAVENOUS

## 2021-11-01 NOTE — Anesthesia Preprocedure Evaluation (Signed)
Anesthesia Evaluation  Patient identified by MRN, date of birth, ID band Patient awake    Reviewed: Allergy & Precautions, NPO status , Patient's Chart, lab work & pertinent test results  Airway Mallampati: II  TM Distance: >3 FB Neck ROM: Full    Dental no notable dental hx.    Pulmonary sleep apnea , Current Smoker,    Pulmonary exam normal        Cardiovascular Exercise Tolerance: Poor hypertension, + CAD  Normal cardiovascular exam  Left Ventricle: Left ventricular ejection fraction, by estimation, is 60 to 65%. The left ventricle has normal function. The left ventricle has no regional wall motion abnormalities. The left ventricular internal cavity size was normal in size. There is no left ventricular hypertrophy. Left ventricular diastolic parameters were normal. Right Ventricle: The right ventricular size is normal. No increase in right ventricular wall thickness. Right ventricular systolic function is normal. There is normal pulmonary artery systolic pressure. The tricuspid regurgitant velocity is 2.56 m/s, and with an assumed right atrial pressure of 3 mmHg, the estimated right ventricular systolic pressure is 43.1 mmHg. Left Atrium: Left atrial size was normal in size.   Neuro/Psych PSYCHIATRIC DISORDERS Anxiety Depression negative neurological ROS     GI/Hepatic Neg liver ROS, GERD  ,AV malformation small bowel   Endo/Other  diabetesHypothyroidism   Renal/GU CRFRenal disease     Musculoskeletal  (+) Arthritis , Osteoarthritis,    Abdominal (+) - obese,   Peds  Hematology negative hematology ROS (+) anemia ,   Anesthesia Other Findings   Reproductive/Obstetrics                             Anesthesia Physical Anesthesia Plan  ASA: 3 and emergent  Anesthesia Plan: General   Post-op Pain Management:    Induction: Intravenous  PONV Risk Score and Plan: 1 and TIVA  Airway  Management Planned: Nasal Cannula and Natural Airway  Additional Equipment:   Intra-op Plan:   Post-operative Plan:   Informed Consent: I have reviewed the patients History and Physical, chart, labs and discussed the procedure including the risks, benefits and alternatives for the proposed anesthesia with the patient or authorized representative who has indicated his/her understanding and acceptance.     Dental advisory given  Plan Discussed with: CRNA  Anesthesia Plan Comments:         Anesthesia Quick Evaluation

## 2021-11-01 NOTE — Progress Notes (Signed)
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  The patient's neurologist was drawing routine labs in preparation for Botox injection for the patient's contractures.  The patient was noted to have a hemoglobin of 6.0.  The patient's PCP was notified and directed the patient to the emergency department for further evaluation and treatment.  The patient is unable to provide any history at this time.  Review of the medical record does not reveal any history of hematochezia, melena, or hematemesis.  The patient herself denies any chest pain, shortness of breath, dizziness.  Remainder the review of systems is difficult to obtain secondary to cognitive impairment. In the ED, the patient was afebrile hemodynamically stable with oxygen saturation 100% room air.  BMP showed sodium 140, potassium 4.3, bicarbonate 23, serum creatinine 1.43.  LFTs were unremarkable.  WBC 7.5, hemoglobin 6.2, platelets 418,000.  Assessment/Plan: Acute blood loss anemia/FOBT+/ Fe deficiency anemia -2 units PRBC transfused -GI consult -Continue Protonix IV twice daily -Clear liquid diet for now -Iron saturation 5%, ferritin 97 -Y24 8250 -Folic acid 03.7  CKD stage IIIb -Baseline creatinine 1.3-1.5 -Monitor BMP  Hypothyroidism -Continue Synthroid  Hyperlipidemia -Continue statin  Chronic left upper extremity spastic weakness -PT/OT  Essential hypertension -Previously on HCTZ/ARB which has been discontinued -BPs remain acceptable -Follow clinically  Goals of care -Full scope of treatment           Family  Communication:  no Family at bedside  Consultants:  GI  Code Status:  FULL   DVT Prophylaxis:  SCDs   Procedures: As Listed in Progress Note Above  Antibiotics: None      Subjective: Denies chest pain, shortness breath, abdominal pain.  Remainder review of systems unobtainable secondary to patient's cognitive impairment Objective: Vitals:   11/01/21 0130 11/01/21 0430 11/01/21 0552 11/01/21 0835  BP: (!) 164/80 (!) 157/96 (!) (P) 157/96 137/78  Pulse: 72 80 (P) 80 84  Resp: 18 20 (P) 20 20  Temp: 97.8 F (36.6 C) 98.2 F (36.8 C) (P) 98.2 F (36.8 C) (!) 97.5 F (36.4 C)  TempSrc: Axillary Axillary (P) Oral Axillary  SpO2: 100% 99%  100%  Weight:      Height:   (P) 5\' 2"  (1.575 m)     Intake/Output Summary (Last 24 hours) at 11/01/2021 0906 Last data filed at 11/01/2021 0430 Gross per 24 hour  Intake 730 ml  Output --  Net 730 ml   Weight change:  Exam:  General:  Pt is alert,  intermittetly follows commands  not in acute distress HEENT: No icterus, No thrush, No neck mass, [place]/AT Cardiovascular: RRR, S1/S2, no rubs, no gallops Respiratory: Bibasilar crackles but no wheezing Abdomen: Soft/+BS, non tender, non distended, no guarding Extremities: No edema, No lymphangitis, No petechiae, No rashes, no synovitis; left upper extremity contracture   Data Reviewed: I have personally reviewed following labs and imaging studies Basic Metabolic Panel: Recent Labs  Lab 10/27/21 1128 10/31/21 1511 11/01/21 0626  NA 145* 140 138  K 4.3 4.3 4.3  CL 110* 110 108  CO2 21 23 21*  GLUCOSE 122* 103* 93  BUN 24 27* 29*  CREATININE 1.62* 1.43* 1.27*  CALCIUM 10.6* 10.1 9.7   Liver Function Tests: Recent Labs  Lab 10/27/21 1128 10/31/21 1511  AST 18 18  ALT 12 13  ALKPHOS 80 70  BILITOT <0.2 0.3  PROT 6.2 6.4*  ALBUMIN 4.0 3.4*   No results for input(s): LIPASE, AMYLASE in the last 168 hours. No results for input(s): AMMONIA in the last 168  hours. Coagulation Profile: No results for input(s): INR, PROTIME in the last 168 hours. CBC: Recent Labs  Lab 10/27/21 1128 10/31/21 1511 11/01/21 0626  WBC 6.5 7.5 7.8  NEUTROABS 4.4  --   --   HGB 6.0* 6.2* 9.9*  HCT 20.0* 21.2* 31.7*  MCV 92 95.5 91.4  PLT 443 418* 301   Cardiac Enzymes: Recent Labs  Lab 10/27/21 1128  CKTOTAL 62  CKMBINDEX 2.3   BNP: Invalid input(s): POCBNP CBG: No results for input(s): GLUCAP in the last 168 hours. HbA1C: No results for input(s): HGBA1C in the last 72 hours. Urine analysis:    Component Value Date/Time   COLORURINE YELLOW 07/07/2021 1622   APPEARANCEUR CLEAR 07/07/2021 1622   LABSPEC 1.016 07/07/2021 1622   PHURINE 5.0 07/07/2021 1622   GLUCOSEU NEGATIVE 07/07/2021 1622   HGBUR NEGATIVE 07/07/2021 1622   BILIRUBINUR NEGATIVE 07/07/2021 1622   KETONESUR NEGATIVE 07/07/2021 1622   PROTEINUR NEGATIVE 07/07/2021 1622   NITRITE NEGATIVE 07/07/2021 1622   LEUKOCYTESUR NEGATIVE 07/07/2021 1622   Sepsis Labs: @LABRCNTIP (procalcitonin:4,lacticidven:4) ) Recent Results (from the past 240 hour(s))  Resp Panel by RT-PCR (Flu A&B, Covid) Nasopharyngeal Swab     Status: None   Collection Time: 10/31/21  6:50 PM   Specimen: Nasopharyngeal Swab; Nasopharyngeal(NP) swabs in vial transport medium  Result Value Ref Range Status   SARS Coronavirus 2 by RT PCR NEGATIVE NEGATIVE Final    Comment: (NOTE) SARS-CoV-2 target nucleic acids are NOT DETECTED.  The SARS-CoV-2 RNA is generally detectable in upper respiratory specimens during the acute phase of infection. The lowest concentration of SARS-CoV-2 viral copies this assay can detect is 138 copies/mL. A negative result does not preclude SARS-Cov-2 infection and should not be used as the sole basis for treatment or other patient management decisions. A negative result may occur with  improper specimen collection/handling, submission of specimen other than nasopharyngeal swab, presence  of viral mutation(s) within the areas targeted by this assay, and inadequate number of viral copies(<138 copies/mL). A negative result must be combined with clinical observations, patient history, and epidemiological information. The expected result is Negative.  Fact Sheet for Patients:  EntrepreneurPulse.com.au  Fact Sheet for Healthcare Providers:  IncredibleEmployment.be  This test is no t yet approved or cleared by the [place] FDA and  has been authorized for detection and/or diagnosis of SARS-CoV-2 by FDA under an Emergency Use Authorization (EUA). This EUA will remain  in effect (meaning this test can be used) for the duration of the COVID-19 declaration under Section 564(b)(1) of the Act, 21 U.S.C.section 360bbb-3(b)(1), unless the authorization is terminated  or revoked sooner.       Influenza A by PCR NEGATIVE NEGATIVE Final   Influenza B by PCR NEGATIVE NEGATIVE Final    Comment: (NOTE) The Xpert Xpress SARS-CoV-2/FLU/RSV plus assay is intended as an aid in the diagnosis of influenza from Nasopharyngeal swab specimens and should not be used as a sole basis for treatment. Nasal washings and aspirates are unacceptable for Xpert Xpress SARS-CoV-2/FLU/RSV testing.  Fact Sheet for Patients: EntrepreneurPulse.com.au  Fact Sheet for Healthcare  Providers: IncredibleEmployment.be  This test is not yet approved or cleared by the [place] and has been authorized for detection and/or diagnosis of SARS-CoV-2 by FDA under an Emergency Use Authorization (EUA). This EUA will remain in effect (meaning this test can be used) for the duration of the COVID-19 declaration under Section 564(b)(1) of the Act, 21 U.S.C. section 360bbb-3(b)(1), unless the authorization is terminated or revoked.  Performed at [facility], [address] [place], [place] [address]      Scheduled Meds:  ascorbic  acid  500 mg Oral Daily   DULoxetine  60 mg Oral Daily   feeding supplement  237 mL Oral TID   ferrous sulfate  325 mg Oral Q breakfast   folic acid  1 mg Oral Daily   levothyroxine  125 mcg Oral Q0600   loratadine  10 mg Oral Daily   magnesium oxide  200 mg Oral BID   multivitamin with minerals  1 tablet Oral Daily   rosuvastatin  20 mg Oral Daily   thiamine  100 mg Oral Daily   topiramate  50 mg Oral BID   traZODone  25 mg Oral QHS   vitamin B-12  1,000 mcg Oral Daily   zinc sulfate  220 mg Oral Daily   Continuous Infusions:  methocarbamol (ROBAXIN) IV     pantoprazole (PROTONIX) IV 80 mg (11/01/21 0030)    Procedures/Studies: No results found.  Orson Eva, DO  [facility]  If 7PM-7AM, please contact night-coverage [email or web address] Password [email or web address] 11/01/2021, 9:06 AM   LOS: 1 day

## 2021-11-01 NOTE — Brief Op Note (Signed)
10/31/2021 - 11/01/2021  4:20 PM  PATIENT:  Joanna [name]  71 y.o. female  PRE-OPERATIVE DIAGNOSIS:  Anemia, heme positive stool, melena, history of duodenal and jejunal AVMs  POST-OPERATIVE DIAGNOSIS:  gastric erosions ans ulcers x2  PROCEDURE:  Procedure(s): ESOPHAGOGASTRODUODENOSCOPY (EGD) WITH PROPOFOL (N/A) ENTEROSCOPY (N/A) BIOPSY  SURGEON:  Surgeon(s) and Role:    * Harvel Quale, MD - Primary  Patient underwent push enteroscopy under propofol sedation.  Tolerated procedure adequately.  She was found to have normal esophagus. Two non-bleeding cratered gastric ulcers with a clean ulcer base (Forrest Class III) were found in the gastric antrum.  The largest lesion was 8 mm in largest dimension.  There were some associated erosions in the antrum. Biopsies were taken from body and antrum with a cold forceps for Helicobacter pylori testing.  Diffuse nodular mucosa was found in the gastric fundus and in the gastric body.  Biopsies were taken from an area that had some mild spontaneous oozing with a cold forceps for histology. There was no evidence of significant pathology in the entire examined duodenum.  Upon careful inspection, no presence of hematin or active bleeding was present.  RECOMMENDATIONS:. - Return patient to hospital ward for ongoing care.  - Advance diet as tolerated.  - Await pathology results.  - Pantoprazole 40 mg BID. - Restart oral iron - Repeat the small bowel enteroscopy in 3 months to check healing.  - No ibuprofen, naproxen, or other non-steroidal anti-inflammatory drugs.   Maylon Peppers, MD Gastroenterology and Hepatology [facility]

## 2021-11-01 NOTE — Op Note (Signed)
[facility] Patient Name: Joanna [name] Procedure Date: 11/01/2021 3:50 PM MRN: [id number] Date of Birth: 01-23-1951 Attending MD: Maylon Peppers ,  CSN: [id number] Age: 71 Admit Type: Outpatient Procedure:                Small bowel enteroscopy Indications:              Iron deficiency anemia, Melena Providers:                Maylon Peppers, Rosina Lowenstein, RN, Laureen Abrahams Referring MD:              Medicines:                Monitored Anesthesia Care Complications:            No immediate complications. Estimated Blood Loss:     Estimated blood loss: none. Procedure:                Pre-Anesthesia Assessment:                           - Prior to the procedure, a History and Physical                            was performed, and patient medications, allergies                            and sensitivities were reviewed. The patient's                            tolerance of previous anesthesia was reviewed.                           - The risks and benefits of the procedure and the                            sedation options and risks were discussed with the                            patient. All questions were answered and informed                            consent was obtained.                           - ASA Grade Assessment: III - A patient with severe                            systemic disease.                           After obtaining informed consent, the endoscope was                            passed under direct vision. Throughout the                            procedure, the patient's blood  pressure, pulse, and                            oxygen saturations were monitored continuously. The                            [id number]) scope was introduced through                            the mouth and advanced to the fourth part of                            duodenum. The small bowel enteroscopy was                            accomplished without difficulty. The  patient                            tolerated the procedure well. Scope In: 4:01:32 PM Scope Out: 4:14:11 PM Total Procedure Duration: 0 hours 12 minutes 39 seconds  Findings:      The examined esophagus was normal.      Two non-bleeding cratered gastric ulcers with a clean ulcer base       (Forrest Class III) were found in the gastric antrum. The largest lesion       was 8 mm in largest dimension. There were some associated erosions in       the antrum. Biopsies were taken from body and antrum with a cold forceps       for Helicobacter pylori testing.      Diffuse nodular mucosa was found in the gastric fundus and in the       gastric body. Biopsies were taken from an area that had some mild       spontaneous oozing with a cold forceps for histology.      There was no evidence of significant pathology in the entire examined       duodenum. Upon careful inspection, no presence of hematin or active       bleeding was present. Impression:               - Normal esophagus.                           - Non-bleeding gastric ulcers with a clean ulcer                            base (Forrest Class III). There were some                            associated erosions in the antrum. Biopsied.                           - Nodular mucosa in the gastric fundus and in the                            gastric body. Biopsied.                           -  Normal examined duodenum. Moderate Sedation:      Per Anesthesia Care Recommendation:           - Return patient to hospital ward for ongoing care.                           - Advance diet as tolerated.                           - Await pathology results.                           - Pantoprazole 40 mg BID.                           - Restart oral iron                           - Repeat the small bowel enteroscopy in 3 months to                            check healing.                           - No ibuprofen, naproxen, or other non-steroidal                             anti-inflammatory drugs. Procedure Code(s):        --- Professional ---                           [id number], Small intestinal endoscopy, enteroscopy                            beyond second portion of duodenum, not including                            ileum; with biopsy, single or multiple Diagnosis Code(s):        --- Professional ---                           K25.9, Gastric ulcer, unspecified as acute or                            chronic, without hemorrhage or perforation                           K31.89, Other diseases of stomach and duodenum                           D50.9, Iron deficiency anemia, unspecified                           K92.1, Melena (includes Hematochezia) CPT copyright 2019 American Medical Association. All rights reserved. The codes documented in this report are preliminary and upon coder review may  be revised to meet current compliance requirements. Maylon Peppers, MD Maylon Peppers,  11/01/2021 4:21:54 PM This report has been signed electronically. Number of Addenda: 0

## 2021-11-01 NOTE — TOC Initial Note (Signed)
Transition of Care [facility]) - Initial/Assessment Note    Patient Details  Name: Joanna [name] MRN: [id number] Date of Birth: Jan 22, 1951  Transition of [facility]) CM/SW Contact:    Salome Arnt, [place] Phone Number: 11/01/2021, 9:23 AM  Clinical Narrative:  Pt admitted due to acute blood loss anemia. Pt has been a resident at [facility] since October. Per Elberta Fortis, she requires total assist with all ADLs, including feeding. No home health prior to admission. Okay to return. LCSW spoke with pt's daughter who lives out of state. She confirms plan to return to [facility] when medically stable. Daughter is coming to visit tomorrow for a few days. TOC will continue to follow.                    Barriers to Discharge: Continued Medical Work up   Patient Goals and CMS Choice Patient states their goals for this hospitalization and ongoing recovery are:: return to [facility]   Choice offered to / list presented to : Adult Children  Expected Discharge Plan and Services   In-house Referral: Clinical Social Work   Post Acute Care Choice: Resumption of Svcs/PTA Provider Living arrangements for the past 2 months:  (Family care home)                                      Prior Living Arrangements/Services Living arrangements for the past 2 months:  (Family care home) Lives with:: Facility Resident Patient language and need for interpreter reviewed:: Yes Do you feel safe going back to the place where you live?: Yes      Need for Family Participation in Patient Care: Yes (Comment) Care giver support system in place?: Yes (comment) Current home services: DME (wheelchair) Criminal Activity/Legal Involvement Pertinent to Current Situation/Hospitalization: No - Comment as needed  Activities of Daily Living Home Assistive Devices/Equipment: Cane (specify quad or straight) ADL Screening (condition at time of admission) Patient's cognitive ability adequate to  safely complete daily activities?: Yes Is the patient deaf or have difficulty hearing?: No Does the patient have difficulty seeing, even when wearing glasses/contacts?: No Does the patient have difficulty concentrating, remembering, or making decisions?: Yes Patient able to express need for assistance with ADLs?: Yes Does the patient have difficulty dressing or bathing?: No Independently performs ADLs?: Yes (appropriate for developmental age) Does the patient have difficulty walking or climbing stairs?: Yes Weakness of Legs: Both Weakness of Arms/Hands: Left  Permission Sought/Granted         Permission granted to share info w AGENCY: [facility]  Permission granted to share info w Relationship: facility     Emotional Assessment   Attitude/Demeanor/Rapport: Unable to Assess Affect (typically observed): Unable to Assess Orientation: : Oriented to Self Alcohol / Substance Use: Not Applicable Psych Involvement: No (comment)  Admission diagnosis:  GI bleed [K92.2] Patient Active Problem List   Diagnosis Date Noted   ABLA (acute blood loss anemia) 11/01/2021   Blood loss anemia 10/31/2021   GI bleed 10/31/2021   Spastic hemiparesis of left nondominant side ([place]) 10/27/2021   Stage 3b chronic kidney disease ([place]) 08/23/2021   Left-sided weakness    COVID    Protein-calorie malnutrition, severe 07/16/2021   Altered mental status    Acute renal failure superimposed on stage 4 chronic kidney disease ([place]) 06/26/2021   Dehydration 06/25/2021   Fall at home, initial  encounter 06/25/2021   Hypernatremia 06/25/2021   Dizziness and giddiness 04/19/2021   Hypomagnesemia 04/19/2021   Hypokalemia 04/19/2021   Near syncope 04/19/2021   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 ([place]) 01/19/2021   Non-recurrent acute suppurative otitis media of left ear without spontaneous rupture of tympanic membrane 01/06/2021   Seasonal allergic rhinitis  due to pollen 01/06/2021   Abnormal CT scan, stomach 02/06/2020   Coronary artery disease due to lipid rich plaque 11/19/2019   Aortic atherosclerosis ([place]) 11/19/2019   Constipation 10/06/2019   AVM (arteriovenous malformation) of small bowel, acquired    Pain in joint of left shoulder 05/13/2019   Pain of left hand 04/24/2019   Adhesive capsulitis of left shoulder 04/16/2019   Pain in left foot 12/06/2018   IDA (iron deficiency anemia) 12/03/2018   Left hand weakness 08/16/2018   Numbness 07/10/2018   Sleep apnea 12/25/2017   Right carpal tunnel syndrome 12/24/2017   Chronic anxiety 02/13/2017   Right rotator cuff tendinitis 02/11/2015   Iron deficiency 08/25/2014   Microcytic anemia 06/17/2014   Insomnia 05/04/2014   Hypothyroidism 06/05/2013   Essential hypertension, benign 06/05/2013   Stress fracture of right foot 02/27/2013   Chronic radicular lumbar pain 02/27/2013   Hyperlipidemia 01/16/2013   Type 2 diabetes mellitus ([place]) 01/16/2013   PCP:  [name], MD Pharmacy:   [facility], [place] [facility] [address] [place] [address] Phone: [phone] Fax: [phone]     Social Determinants of Health (SDOH) Interventions    Readmission Risk Interventions Readmission Risk Prevention Plan 11/01/2021 06/27/2021  Transportation Screening Complete Complete  HRI or Home Care Consult Complete Complete  Social Work Consult for [place] Planning/Counseling Complete Complete  Palliative Care Screening Not Applicable Not Applicable  Medication Review Press photographer) Complete Complete  Some recent data might be hidden

## 2021-11-01 NOTE — Consult Note (Signed)
@LOGO @   Referring Provider: [facility]  Primary Care Physician:  [name], MD Primary Gastroenterologist:  Dr. Abbey Chatters  Date of Admission:  Date of Consultation:   Reason for Consultation:  Anemia with heme positive stool  HPI:  Joanna [name] is a 71 y.o. 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  ED course: Hemodynamically stable. Hemoglobin 6.2 (L), FOBT positive, BUN 27 (H), creatinine 1.43 (H), LFTs and electrolytes within normal limits, ferritin 97, iron 14, (L), saturation 5% (L). 2 units PRBCs were ordered.   This morning, hemoglobin is improved to 9.9.  Today:  Patient is alert and oriented x 3 and provides history though unclear on reliability. She reports she has had some black stools, but can't tell me when this started or the last occurrence.  She is chronically on oral iron.  Denies brbpr, abdominal pain, constipation, diarrhea, nausea, vomiting, reflux symptoms, dysphagia.   On aspirin 81 mg daily.  No other NSAIDs on med list.  Patient is unable to provide medication history.  Spoke with [employer].  There have been no reports of melena or rectal bleeding at the facility.  No reports of abdominal pain, nausea, or vomiting.  She is not on a blood thinner.  She does take 81 mg aspirin daily.   Discussed with daughter, Joanna [name], who gave the ok to proceed with EGD/push enteroscopy today.    Last EGD 02/19/2020: Normal esophagus, hypertrophic  gastric folds and cardia, body, and fundus biopsied, normal examined duodenum.  Pathology consistent with fundic gland polyp.  Last colonoscopy 06/06/2019: Normal TI, 6 mm polyp in the rectum removed, pancolonic diverticula, external and internal hemorrhoids.  Pathology with benign submucosal lipoma.  Recommended repeat in 10 years.  Givens Capsule 10/26/2014: Frequent AVMs in duodenum extending into jejunum.  Limited views of colon due to retained contents.  No old blood or fresh blood in the stomach.  Melena seen in small bowel and colon.   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 GIVENS   Bilateral headaches    Depression    Diabetes mellitus    Diverticulosis    Dyslipidemia    GERD (gastroesophageal reflux disease)    Hypertension    Insomnia    Microproteinuria    Renal insufficiency 2010   Right carpal tunnel syndrome 12/24/2017   Sleep apnea    Sleep apnea 12/25/2017   Abnormal sleep study February 2019 CPAP ordered   Thyroid disease    hypothyroid    Past Surgical History:  Procedure Laterality Date   ABDOMINAL HYSTERECTOMY  1984   BACK SURGERY     BILATERAL OOPHORECTOMY     BIOPSY  02/19/2020   Procedure: BIOPSY;  Surgeon: [name], MD;  Location: [facility];  Service: Endoscopy;;  gastric   CERVICAL DISC SURGERY  01/16/2019   COLONOSCOPY  01/29/12   Fields-2-3 hyperplastic polyps, mod internal hemorrhoids, diverticulosis throughout colon   COLONOSCOPY N/A 07/03/2014   Dr.  Fields: Hyperplastic polyps   COLONOSCOPY N/A 06/06/2019   Dr. Oneida Alar: Diverticulosis, hemorrhoids, single polyp removed, pathology revealed lipoma.  Next colonoscopy 10 years   ESOPHAGOGASTRODUODENOSCOPY   01/29/2012   Fields-2-3cm sliding HH, fundic gland polyps, NEGATIVE H pylori   ESOPHAGOGASTRODUODENOSCOPY N/A 07/03/2014   Dr. Oneida Alar: gastritis and normal small bowel biopsies   ESOPHAGOGASTRODUODENOSCOPY N/A 02/19/2020    Fields: hypertrophic folds in the stomach. benign polyp removed   GIVENS CAPSULE STUDY N/A 10/26/2014   SMALL BOWEL AVMs   KNEE ARTHROSCOPY Right    POLYPECTOMY  06/06/2019   Procedure: POLYPECTOMY;  Surgeon: [name], MD;  Location: [facility];  Service: Endoscopy;;   TOTAL ABDOMINAL HYSTERECTOMY W/ BILATERAL SALPINGOOPHORECTOMY      Prior to Admission medications   Medication Sig Start Date End Date Taking? Authorizing Provider  acetaminophen (TYLENOL) 500 MG tablet Take 1,000 mg by mouth every 8 (eight) hours as needed for moderate pain.    [provider]  albuterol (PROVENTIL HFA;VENTOLIN HFA) 108 (90 Base) MCG/ACT inhaler Inhale 2 puffs into the lungs every 4 (four) hours as needed for wheezing or shortness of breath. 12/25/18   [name], MD  ascorbic acid (VITAMIN C) 500 MG tablet Take 1 tablet (500 mg total) by mouth daily. 07/19/21   Antonieta Pert, MD  aspirin EC 81 MG EC tablet Take 1 tablet (81 mg total) by mouth daily. Swallow whole. 07/19/21   Antonieta Pert, MD  diphenhydrAMINE (BENADRYL) 25 mg capsule Take 1 capsule (25 mg total) by mouth every 8 (eight) hours as needed for itching or allergies. 06/28/21   Barb Merino, MD  DULoxetine (CYMBALTA) 60 MG capsule Take 1 capsule (60 mg total) by mouth daily. 10/27/21   Marcial Pacas, MD  famotidine (PEPCID) 20 MG tablet Take 1 tablet (20 mg total) by mouth 2 (two) times daily. 06/28/21 06/28/22  Barb Merino, MD  ferrous sulfate 325 (65 FE) MG tablet Take 325 mg by mouth daily with breakfast.    [provider]  fexofenadine (ALLEGRA) 180 MG tablet Take 180 mg by mouth daily as needed for allergies.    [provider]  folic acid (FOLVITE) 1 MG tablet TAKE 1 TABLET(1 MG) BY MOUTH DAILY 08/04/21   [name], MD  levothyroxine (SYNTHROID) 125 MCG tablet 1 every morning 30 minutes before meal on empty stomach with water 10/31/21   [name], MD  Magnesium 250 MG TABS Take 1 tablet (250 mg  total) by mouth 2 (two) times daily. 06/28/21   Barb Merino, MD  Multiple Vitamin (MULTIVITAMIN WITH MINERALS) TABS tablet Take 1 tablet by mouth daily. 07/26/21   Elodia Florence., MD  Nutritional Supplements (ENSURE PLUS HIGH PROTEIN) LIQD Take 237 mLs by mouth 3 (three) times daily. 07/25/21   Elodia Florence., MD  rosuvastatin (CRESTOR) 20 MG tablet Take 1 tablet by mouth daily (Stop Pravastatin) Patient taking differently: Take 20 mg by mouth daily. 01/19/21   [name], MD  thiamine 100 MG tablet Take 1 tablet (100 mg total) by mouth daily. 07/19/21   Antonieta Pert, MD  topiramate (TOPAMAX) 50 MG tablet Take 1 tablet (50 mg total) by mouth 2 (two) times daily. 04/29/21   [name], MD  traZODone (DESYREL) 50 MG tablet TAKE 1/2 TO 1 TABLET BY MOUTH EVERY NIGHT FOR SLEEP Patient taking differently: Take 25 mg by mouth at bedtime. 01/19/21   [name], MD  vitamin B-12 (CYANOCOBALAMIN) 500  MCG tablet Take 1,000 mcg by mouth daily.    [provider]  zinc sulfate 220 (50 Zn) MG capsule Take 1 capsule (220 mg total) by mouth daily. 07/19/21   Antonieta Pert, MD    Current Facility-Administered Medications  Medication Dose Route Frequency Provider Last Rate Last Admin   acetaminophen (TYLENOL) tablet 650 mg  650 mg Oral Q6H PRN Emeterio Reeve, DO       Or   acetaminophen (TYLENOL) suppository 650 mg  650 mg Rectal Q6H PRN Emeterio Reeve, DO       albuterol (PROVENTIL) (2.5 MG/3ML) 0.083% nebulizer solution 2.5 mg  2.5 mg Inhalation Q4H PRN Emeterio Reeve, DO       ascorbic acid (VITAMIN C) tablet 500 mg  500 mg Oral Daily Emeterio Reeve, DO   500 mg at 10/31/21 2204   bisacodyl (DULCOLAX) EC tablet 5 mg  5 mg Oral Daily PRN Emeterio Reeve, DO       diphenhydrAMINE (BENADRYL) capsule 25 mg  25 mg Oral Q8H PRN Emeterio Reeve, DO       DULoxetine (CYMBALTA) DR capsule 60 mg  60 mg Oral Daily Emeterio Reeve, DO   60 mg at 10/31/21 2201    feeding supplement (ENSURE ENLIVE / ENSURE PLUS) liquid 237 mL  237 mL Oral TID Emeterio Reeve, DO   237 mL at 10/31/21 2206   ferrous sulfate tablet 325 mg  325 mg Oral Q breakfast Emeterio Reeve, DO       folic acid (FOLVITE) tablet 1 mg  1 mg Oral Daily Emeterio Reeve, DO   1 mg at 10/31/21 2201   levothyroxine (SYNTHROID) tablet 125 mcg  125 mcg Oral Q0600 Emeterio Reeve, DO   125 mcg at 10/31/21 2202   loratadine (CLARITIN) tablet 10 mg  10 mg Oral Daily Emeterio Reeve, DO   10 mg at 10/31/21 2205   magnesium oxide (MAG-OX) tablet 200 mg  200 mg Oral BID Emeterio Reeve, DO   200 mg at 10/31/21 2206   methocarbamol (ROBAXIN) 500 mg in dextrose 5 % 50 mL IVPB  500 mg Intravenous Q6H PRN Emeterio Reeve, DO       multivitamin with minerals tablet 1 tablet  1 tablet Oral Daily Emeterio Reeve, DO   1 tablet at 10/31/21 2204   oxyCODONE (Oxy IR/ROXICODONE) immediate release tablet 5 mg  5 mg Oral Q4H PRN Emeterio Reeve, DO       pantoprazole (PROTONIX) 80 mg /NS 100 mL IVPB  80 mg Intravenous Q12H Emeterio Reeve, DO 300 mL/hr at 11/01/21 0030 80 mg at 11/01/21 0030   rosuvastatin (CRESTOR) tablet 20 mg  20 mg Oral Daily Emeterio Reeve, DO   20 mg at 10/31/21 2207   thiamine tablet 100 mg  100 mg Oral Daily Emeterio Reeve, DO   100 mg at 10/31/21 2204   topiramate (TOPAMAX) tablet 50 mg  50 mg Oral BID Emeterio Reeve, DO   50 mg at 10/31/21 2203   traZODone (DESYREL) tablet 25 mg  25 mg Oral QHS Emeterio Reeve, DO   25 mg at 10/31/21 2202   vitamin B-12 (CYANOCOBALAMIN) tablet 1,000 mcg  1,000 mcg Oral Daily Emeterio Reeve, DO   1,000 mcg at 10/31/21 2203   zinc sulfate capsule 220 mg  220 mg Oral Daily Emeterio Reeve, DO   220 mg at 10/31/21 2207    Allergies as of 10/31/2021 - Review Complete 10/31/2021  Allergen Reaction Noted   Zoloft [sertraline hcl] Other (See  Comments) 05/21/2017   Ivp dye [iodinated contrast media] Rash and  Other (See Comments) 06/04/2013    Family History  Problem Relation Age of Onset   Cardiomyopathy Father    Heart failure Mother    Hyperlipidemia Mother    Stroke Brother    Kidney cancer Brother    Diabetes Paternal Uncle    Heart disease Paternal Grandmother    Colon cancer Neg Hx    Colon polyps Neg Hx     Social History   Socioeconomic History   Marital status: Divorced    Spouse name: Not on file   Number of children: 2   Years of education: Not on file   Highest education level: Not on file  Occupational History   Occupation: retired    Comment: 2014 or so  Tobacco Use   Smoking status: Every Day    Packs/day: 0.25    Years: 30.00    Pack years: 7.50    Types: Cigarettes    Last attempt to quit: 10/16/2010    Years since quitting: 11.0   Smokeless tobacco: Former    Quit date: 10/28/2010  Vaping Use   Vaping Use: Never used  Substance and Sexual Activity   Alcohol use: Yes    Comment: socially, maybe 3 times a year    Drug use: No   Sexual activity: Not on file  Other Topics Concern   Not on file  Social History Narrative   Right handed    Lives alone    Caffeine- 5-6 cups per day    Social Determinants of Health   Financial Resource Strain: Low Risk    Difficulty of Paying Living Expenses: Not hard at all  Food Insecurity: No Food Insecurity   Worried About Charity fundraiser in the Last Year: Never true   [place] in the Last Year: Never true  Transportation Needs: No Transportation Needs   Lack of Transportation (Medical): No   Lack of Transportation (Non-Medical): No  Physical Activity: Inactive   Days of Exercise per Week: 0 days   Minutes of Exercise per Session: 0 min  Stress: No Stress Concern Present   Feeling of Stress : Not at all  Social Connections: Socially Isolated   Frequency of Communication with Friends and Family: More than three times a week   Frequency of Social Gatherings with Friends and Family: More than three  times a week   Attends Religious Services: Never   Marine scientist or Organizations: No   Attends Music therapist: Never   Marital Status: Divorced  Human resources officer Violence: Not At Risk   Fear of Current or Ex-Partner: No   Emotionally Abused: No   Physically Abused: No   Sexually Abused: No    Review of Systems: Gen: Denies fever, chills, cold or flulike symptoms, presyncope, syncope. CV: Denies chest pain, heart palpitations. Resp: Denies shortness of breath or cough. GI: See HPI Heme: See HPI  Physical Exam: Vital signs in last 24 hours: Temp:  [97.6 F (36.4 C)-98.8 F (37.1 C)] (P) 98.2 F (36.8 C) (01/17 0552) Pulse Rate:  [72-109] (P) 80 (01/17 0552) Resp:  [15-20] (P) 20 (01/17 0552) BP: (118-164)/(70-103) (P) 157/96 (01/17 0552) SpO2:  [85 %-100 %] 99 % (01/17 0430) Weight:  [49.1 kg] 49.1 kg (01/16 2100)   General:   Alert,  Well-developed, well-nourished, pleasant and cooperative in NAD Head:  Normocephalic and atraumatic. Eyes:  Sclera clear, no icterus.  Conjunctiva pink. Ears:  Normal auditory acuity. Lungs:  Clear throughout to auscultation.   No wheezes, crackles, or rhonchi. No acute distress. Heart:  Regular rate and rhythm; no murmurs, clicks, rubs,  or gallops. Abdomen:  Soft, nontender and nondistended. No masses, hepatosplenomegaly or hernias noted. Normal bowel sounds, without guarding, and without rebound.   Rectal:  No external or internal lesions appreciated. Melena noted.  Msk:  Contracture of left arm/hand.   Extremities:  Without edema. Neurologic:  Alert and  oriented x3 (person, place, and time, unaware of situation);  grossly normal neurologically. Skin:  Intact without significant lesions or rashes. Psych:  Flat affect.  Intake/Output from previous day: 01/16 0701 - 01/17 0700 In: 730 [I.V.:100; Blood:630] Out: -  Intake/Output this shift: No intake/output data recorded.  Lab Results: Recent Labs     10/31/21 1511 11/01/21 0626  WBC 7.5 7.8  HGB 6.2* 9.9*  HCT 21.2* 31.7*  PLT 418* 301   BMET Recent Labs    10/31/21 1511 11/01/21 0626  NA 140 138  K 4.3 4.3  CL 110 108  CO2 23 21*  GLUCOSE 103* 93  BUN 27* 29*  CREATININE 1.43* 1.27*  CALCIUM 10.1 9.7   LFT Recent Labs    10/31/21 1511  PROT 6.4*  ALBUMIN 3.4*  AST 18  ALT 13  ALKPHOS 50  BILITOT 0.3    Impression: 71 y.o. 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 She was found to have hemoglobin of 6.2 in the emergency room, FOBT positive, iron 14 (L), saturation 5% (L), ferritin 97.  No B12 or folate deficiency. BUN slightly elevated at 27 in setting of CKD with creatinine 1.43.  She was received 2 unit PRBCs with hemoglobin up to 9.9 this morning. Remains hemodynamically stable.   Patient reports she has had some black stools, but is unable to elaborate on this.  No reports of melena or bright red blood per rectum from facility.  On rectal exam today, she did have melena.  Last EGD in [name] 2021 with normal esophagus, hypertrophic gastric folds s/p biopsied (pathology with fundic gland polyp).  Colonoscopy in August 2020 with 6 mm benign submucosal lipoma removed, pancolonic diverticula, external and internal hemorrhoids.  Recommended repeat in 10 years.  Prior Givens in 2016 with frequent AVMs in duodenum extending into the jejunum.  She is currently on 81 mg aspirin, but no other NSAIDs and no anticoagulants. Denies any other significant GI  symptoms.   Suspect acute on chronic anemia with heme positive stool and melena [name] very well be secondary to bleeding small bowel AVMs, additional considerations include PUD, gastritis, duodenitis. Recommend EGD with enteroscopy today. Discussed with patient's daughter,  Joanna [name], who gave verbal consent.   Plan: EGD with enteroscopy today with propofol with Dr. Jenetta Downer. The risks, benefits, and alternatives have been discussed with the patient and her daughter (via telephone) in detail. The patient/daughter state their understanding and desires to proceed.  Keep NPO IV Protonix 40 mg BID.  Hold oral iron while inpatient.  [name] benefit from IV iron prior to discharge.  Further recommendations to follow.    LOS: 1 day    11/01/2021, 7:39 AM   Aliene Altes, PA-C [facility]

## 2021-11-02 ENCOUNTER — Encounter (HOSPITAL_COMMUNITY): Payer: Self-pay | Admitting: Osteopathic Medicine

## 2021-11-02 DIAGNOSIS — I700 Atherosclerosis of aorta: Secondary | ICD-10-CM

## 2021-11-02 DIAGNOSIS — E039 Hypothyroidism, unspecified: Secondary | ICD-10-CM

## 2021-11-02 DIAGNOSIS — E43 Unspecified severe protein-calorie malnutrition: Secondary | ICD-10-CM

## 2021-11-02 DIAGNOSIS — K254 Chronic or unspecified gastric ulcer with hemorrhage: Secondary | ICD-10-CM

## 2021-11-02 DIAGNOSIS — I2510 Atherosclerotic heart disease of native coronary artery without angina pectoris: Secondary | ICD-10-CM

## 2021-11-02 DIAGNOSIS — F419 Anxiety disorder, unspecified: Secondary | ICD-10-CM

## 2021-11-02 DIAGNOSIS — I2583 Coronary atherosclerosis due to lipid rich plaque: Secondary | ICD-10-CM

## 2021-11-02 DIAGNOSIS — K5520 Angiodysplasia of colon without hemorrhage: Secondary | ICD-10-CM

## 2021-11-02 LAB — CBC
HCT: 30.4 % — ABNORMAL LOW (ref 36.0–46.0)
Hemoglobin: 9.5 g/dL — ABNORMAL LOW (ref 12.0–15.0)
MCH: 27.6 pg (ref 26.0–34.0)
MCHC: 31.3 g/dL (ref 30.0–36.0)
MCV: 88.4 fL (ref 80.0–100.0)
Platelets: 323 10*3/uL (ref 150–400)
RBC: 3.44 MIL/uL — ABNORMAL LOW (ref 3.87–5.11)
RDW: 17.2 % — ABNORMAL HIGH (ref 11.5–15.5)
WBC: 7.7 10*3/uL (ref 4.0–10.5)
nRBC: 0 % (ref 0.0–0.2)

## 2021-11-02 LAB — TYPE AND SCREEN
ABO/RH(D): O NEG
Antibody Screen: POSITIVE
Unit division: 0
Unit division: 0

## 2021-11-02 LAB — BPAM RBC
Blood Product Expiration Date: 202301182359
Blood Product Expiration Date: 202302162359
ISSUE DATE / TIME: 202301162134
ISSUE DATE / TIME: 202301172243
Unit Type and Rh: 5100
Unit Type and Rh: 9500

## 2021-11-02 LAB — BASIC METABOLIC PANEL
Anion gap: 10 (ref 5–15)
BUN: 32 mg/dL — ABNORMAL HIGH (ref 8–23)
CO2: 19 mmol/L — ABNORMAL LOW (ref 22–32)
Calcium: 9.8 mg/dL (ref 8.9–10.3)
Chloride: 111 mmol/L (ref 98–111)
Creatinine, Ser: 1.41 mg/dL — ABNORMAL HIGH (ref 0.44–1.00)
GFR, Estimated: 40 mL/min — ABNORMAL LOW (ref >60)
Glucose, Bld: 83 mg/dL (ref 70–99)
Potassium: 4 mmol/L (ref 3.5–5.1)
Sodium: 140 mmol/L (ref 135–145)

## 2021-11-02 MED ORDER — PANTOPRAZOLE SODIUM 40 MG PO TBEC: 40.0000 mg | DELAYED_RELEASE_TABLET | Freq: Two times a day (BID) | ORAL | Status: DC

## 2021-11-02 MED ORDER — PANTOPRAZOLE SODIUM 40 MG PO TBEC: 40.0000 mg | DELAYED_RELEASE_TABLET | Freq: Two times a day (BID) | ORAL | 2 refills | Status: DC

## 2021-11-02 NOTE — Plan of Care (Signed)
Problem: Education: Goal: Knowledge of General Education information will improve Description: Including pain rating scale, medication(s)/side effects and non-pharmacologic comfort measures 11/02/2021 0645 by Cyndra Numbers, RN Outcome: Progressing 11/02/2021 0645 by Cyndra Numbers, RN Outcome: Progressing   Problem: Health Behavior/Discharge Planning: Goal: Ability to manage health-related needs will improve 11/02/2021 0645 by Cyndra Numbers, RN Outcome: Progressing 11/02/2021 0645 by Cyndra Numbers, RN Outcome: Progressing   Problem: Clinical Measurements: Goal: Ability to maintain clinical measurements within normal limits will improve 11/02/2021 0645 by Cyndra Numbers, RN Outcome: Progressing 11/02/2021 0645 by Cyndra Numbers, RN Outcome: Progressing Goal: Will remain free from infection 11/02/2021 0645 by Cyndra Numbers, RN Outcome: Progressing 11/02/2021 0645 by Cyndra Numbers, RN Outcome: Progressing Goal: Diagnostic test results will improve 11/02/2021 0645 by Cyndra Numbers, RN Outcome: Progressing 11/02/2021 0645 by Cyndra Numbers, RN Outcome: Progressing Goal: Respiratory complications will improve 11/02/2021 0645 by Cyndra Numbers, RN Outcome: Progressing 11/02/2021 0645 by Cyndra Numbers, RN Outcome: Progressing Goal: Cardiovascular complication will be avoided 11/02/2021 0645 by Cyndra Numbers, RN Outcome: Progressing 11/02/2021 0645 by Cyndra Numbers, RN Outcome: Progressing   Problem: Activity: Goal: Risk for activity intolerance will decrease 11/02/2021 0645 by Cyndra Numbers, RN Outcome: Progressing 11/02/2021 0645 by Cyndra Numbers, RN Outcome: Progressing   Problem: Nutrition: Goal: Adequate nutrition will be maintained 11/02/2021 0645 by Cyndra Numbers, RN Outcome: Progressing 11/02/2021 0645 by Cyndra Numbers, RN Outcome: Progressing   Problem: Coping: Goal: Level of anxiety will decrease 11/02/2021 0645 by Cyndra Numbers, RN Outcome: Progressing 11/02/2021 0645 by Cyndra Numbers, RN Outcome: Progressing   Problem:  Elimination: Goal: Will not experience complications related to bowel motility 11/02/2021 0645 by Cyndra Numbers, RN Outcome: Progressing 11/02/2021 0645 by Cyndra Numbers, RN Outcome: Progressing Goal: Will not experience complications related to urinary retention 11/02/2021 0645 by Cyndra Numbers, RN Outcome: Progressing 11/02/2021 0645 by Cyndra Numbers, RN Outcome: Progressing   Problem: Pain Managment: Goal: General experience of comfort will improve 11/02/2021 0645 by Cyndra Numbers, RN Outcome: Progressing 11/02/2021 0645 by Cyndra Numbers, RN Outcome: Progressing   Problem: Safety: Goal: Ability to remain free from injury will improve 11/02/2021 0645 by Cyndra Numbers, RN Outcome: Progressing 11/02/2021 0645 by Cyndra Numbers, RN Outcome: Progressing   Problem: Skin Integrity: Goal: Risk for impaired skin integrity will decrease 11/02/2021 0645 by Cyndra Numbers, RN Outcome: Progressing 11/02/2021 0645 by Cyndra Numbers, RN Outcome: Progressing   Problem: Education: Goal: Knowledge of General Education information will improve Description: Including pain rating scale, medication(s)/side effects and non-pharmacologic comfort measures 11/02/2021 0645 by Cyndra Numbers, RN Outcome: Progressing 11/02/2021 0645 by Cyndra Numbers, RN Outcome: Progressing   Problem: Health Behavior/Discharge Planning: Goal: Ability to manage health-related needs will improve 11/02/2021 0645 by Cyndra Numbers, RN Outcome: Progressing 11/02/2021 0645 by Cyndra Numbers, RN Outcome: Progressing   Problem: Clinical Measurements: Goal: Ability to maintain clinical measurements within normal limits will improve 11/02/2021 0645 by Cyndra Numbers, RN Outcome: Progressing 11/02/2021 0645 by Cyndra Numbers, RN Outcome: Progressing Goal: Will remain free from infection 11/02/2021 0645 by Cyndra Numbers, RN Outcome: Progressing 11/02/2021 0645 by Cyndra Numbers, RN Outcome: Progressing Goal: Diagnostic test results will improve 11/02/2021 0645 by Cyndra Numbers, RN Outcome:  Progressing 11/02/2021 0645 by Cyndra Numbers, RN Outcome: Progressing Goal: Respiratory complications will improve 11/02/2021 0645 by Cyndra Numbers, RN Outcome: Progressing 11/02/2021 0645 by Cyndra Numbers, RN Outcome: Progressing Goal: Cardiovascular complication will be avoided 11/02/2021 0645 by Cyndra Numbers, RN Outcome: Progressing 11/02/2021 0645 by Cyndra Numbers, RN Outcome: Progressing   Problem: Activity: Goal:  Risk for activity intolerance will decrease 11/02/2021 0645 by Cyndra Numbers, RN Outcome: Progressing 11/02/2021 0645 by Cyndra Numbers, RN Outcome: Progressing   Problem: Nutrition: Goal: Adequate nutrition will be maintained 11/02/2021 0645 by Cyndra Numbers, RN Outcome: Progressing 11/02/2021 0645 by Cyndra Numbers, RN Outcome: Progressing   Problem: Coping: Goal: Level of anxiety will decrease 11/02/2021 0645 by Cyndra Numbers, RN Outcome: Progressing 11/02/2021 0645 by Cyndra Numbers, RN Outcome: Progressing   Problem: Elimination: Goal: Will not experience complications related to bowel motility 11/02/2021 0645 by Cyndra Numbers, RN Outcome: Progressing 11/02/2021 0645 by Cyndra Numbers, RN Outcome: Progressing Goal: Will not experience complications related to urinary retention 11/02/2021 0645 by Cyndra Numbers, RN Outcome: Progressing 11/02/2021 0645 by Cyndra Numbers, RN Outcome: Progressing   Problem: Pain Managment: Goal: General experience of comfort will improve 11/02/2021 0645 by Cyndra Numbers, RN Outcome: Progressing 11/02/2021 0645 by Cyndra Numbers, RN Outcome: Progressing   Problem: Safety: Goal: Ability to remain free from injury will improve 11/02/2021 0645 by Cyndra Numbers, RN Outcome: Progressing 11/02/2021 0645 by Cyndra Numbers, RN Outcome: Progressing   Problem: Skin Integrity: Goal: Risk for impaired skin integrity will decrease 11/02/2021 0645 by Cyndra Numbers, RN Outcome: Progressing 11/02/2021 0645 by Cyndra Numbers, RN Outcome: Progressing

## 2021-11-02 NOTE — Anesthesia Postprocedure Evaluation (Signed)
Anesthesia Post Note  Patient: Jennavieve R Nordlund  Procedure(s) Performed: ESOPHAGOGASTRODUODENOSCOPY (EGD) WITH PROPOFOL ENTEROSCOPY BIOPSY  Patient location during evaluation: Endoscopy Anesthesia Type: General Level of consciousness: awake and alert Pain management: pain level controlled Vital Signs Assessment: post-procedure vital signs reviewed and stable Respiratory status: spontaneous breathing, nonlabored ventilation, respiratory function stable and patient connected to nasal cannula oxygen Cardiovascular status: blood pressure returned to baseline and stable Postop Assessment: no apparent nausea or vomiting Anesthetic complications: no   No notable events documented.   Last Vitals:  Vitals:   11/01/21 2036 11/02/21 0600  BP: (!) 129/94 (!) 125/94  Pulse: (!) 101 (!) 55  Resp: 19 18  Temp: 36.8 C 37.7 C  SpO2: 99% 99%    Last Pain:  Vitals:   11/02/21 0745  TempSrc:   PainSc: 0-No pain                 Trixie Rude

## 2021-11-02 NOTE — Discharge Instructions (Signed)
PLEASE HAVE  YOUR CBC BLOOD COUNT CHECKED IN 1 WEEK    IMPORTANT INFORMATION: PAY CLOSE ATTENTION   PHYSICIAN DISCHARGE INSTRUCTIONS  Follow with Primary care provider  [name], MD  and other consultants as instructed by your Hospitalist Physician  [place] IF SYMPTOMS COME BACK, WORSEN OR NEW PROBLEM DEVELOPS   Please note: You were cared for by a hospitalist during your hospital stay. Every effort will be made to forward records to your primary care provider.  You can request that your primary care provider send for your hospital records if they have not received them.  Once you are discharged, your primary care physician will handle any further medical issues. Please note that NO REFILLS for any discharge medications will be authorized once you are discharged, as it is imperative that you return to your primary care physician (or establish a relationship with a primary care physician if you do not have one) for your post hospital discharge needs so that they can reassess your need for medications and monitor your lab values.  Please get a complete blood count and chemistry panel checked by your Primary MD at your next visit, and again as instructed by your Primary MD.  Get Medicines reviewed and adjusted: Please take all your medications with you for your next visit with your Primary MD  Laboratory/radiological data: Please request your Primary MD to go over all hospital tests and procedure/radiological results at the follow up, please ask your primary care provider to get all Hospital records sent to his/her office.  In some cases, they will be blood work, cultures and biopsy results pending at the time of your discharge. Please request that your primary care provider follow up on these results.  If you are diabetic, please bring your blood sugar readings with you to your follow up appointment with primary care.    Please call and make your  follow up appointments as soon as possible.    Also Note the following: If you experience worsening of your admission symptoms, develop shortness of breath, life threatening emergency, suicidal or homicidal thoughts you must seek medical attention immediately by calling 911 or calling your MD immediately  if symptoms less severe.  You must read complete instructions/literature along with all the possible adverse reactions/side effects for all the Medicines you take and that have been prescribed to you. Take any new Medicines after you have completely understood and accpet all the possible adverse reactions/side effects.   Do not drive when taking Pain medications or sleeping medications (Benzodiazepines)  Do not take more than prescribed Pain, Sleep and Anxiety Medications. It is not advisable to combine anxiety,sleep and pain medications without talking with your primary care practitioner  Special Instructions: If you have smoked or chewed Tobacco  in the last 2 yrs please stop smoking, stop any regular Alcohol  and or any Recreational drug use.  Wear Seat belts while driving.  Do not drive if taking any narcotic, mind altering or controlled substances or recreational drugs or alcohol.

## 2021-11-02 NOTE — Discharge Summary (Signed)
Physician Discharge Summary  Joanna [name] HRC:[id number] DOB: 1951/07/15 DOA: 10/31/2021  PCP: [name], MD GI: [facility]   Admit date: 10/31/2021 Discharge date: 11/02/2021  Admitted From:  ALF  Disposition:  ALF   Recommendations for Outpatient Follow-up:  Follow up with PCP in 2 weeks Follow up with GI in 2-3 months for small bowel enteroscopy  Please obtain CBC in one week to follow up hemoglobin  Please avoid all NSAIDS medications  Please monitor blood pressure and treat if needed   Discharge Condition: STABLE   CODE STATUS: FULL  DIET: Heart Healthy    Brief Hospitalization Summary: Please see all hospital notes, images, labs for full details of the hospitalization. 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  The patient's neurologist was drawing routine labs in preparation for Botox injection for the patient's contractures.  The patient was noted to have a hemoglobin of 6.0.  The patient's PCP was notified and directed the patient to the emergency department for further evaluation and treatment.  The patient is unable to provide any history at this time.  Review of the medical record does not reveal any history of hematochezia, melena, or hematemesis.  The patient herself denies any chest pain, shortness of breath, dizziness.  Remainder the review of systems is difficult to obtain secondary to cognitive impairment.  In the ED, the patient was afebrile hemodynamically stable with oxygen saturation 100% room air.  BMP showed sodium 140, potassium 4.3, bicarbonate 23, serum creatinine 1.43.  LFTs were unremarkable.  WBC 7.5, hemoglobin 6.2, platelets 418,000.   Assessment/Plan: Acute  blood loss anemia/FOBT+/ Fe deficiency anemia -2 units PRBC transfused -GI consulted -Pt was treated with Protonix IV twice daily - discharge on oral protonix 40 mg BID  -EGD with findings of multiple gastric ulceration, biopsies taken -Iron saturation 5%, ferritin 97 -W80 3212 -Folic acid 24.8 -GI recommending small bowel enteroscopy in 3 months   EGD Prelim report from 11/02/21 SURGEON:  Surgeon(s) and Role:    * Harvel Quale, MD - Primary   Patient underwent push enteroscopy under propofol sedation.  Tolerated procedure adequately.  She was found to have normal esophagus. Two non-bleeding cratered gastric ulcers with a clean ulcer base (Forrest Class III) were found in the gastric antrum.  The largest lesion was 8 mm in largest dimension.  There were some associated erosions in the antrum. Biopsies were taken from body and antrum with a cold forceps for Helicobacter pylori testing.  Diffuse nodular mucosa was found in the gastric fundus and in the gastric body.  Biopsies were taken from an area that had some mild spontaneous oozing with a cold forceps for histology. There was no evidence of significant pathology in the entire examined duodenum.  Upon careful inspection, no presence of hematin or active bleeding was present.   RECOMMENDATIONS:. - Return patient to hospital ward for ongoing care.  - Advance diet as tolerated.  - Await pathology results.  - Pantoprazole 40 mg BID. - Restart oral iron - Repeat the small bowel enteroscopy in 3 months to check healing.  - No ibuprofen, naproxen, or other non-steroidal anti-inflammatory drugs.    CKD stage IIIb - STABLE  -Baseline creatinine 1.3-1.5 -Monitor BMP   Hypothyroidism - STABLE  -Continue Synthroid  Hyperlipidemia -Continue statin   Chronic left upper extremity spastic weakness -PT/OT ordered   Essential hypertension -Previously on HCTZ/ARB which has been discontinued -BPs remain acceptable -Follow  clinically   Goals of care -Full scope of treatment    Discharge Diagnoses:  Principal Problem:   Blood loss anemia Active Problems:   Hyperlipidemia   Type 2 diabetes mellitus (HCC)   Hypothyroidism   Essential hypertension, benign   Insomnia   Chronic anxiety   Sleep apnea   AVM (arteriovenous malformation) of small bowel, acquired   Coronary artery disease due to lipid rich plaque   Aortic atherosclerosis (HCC)   Protein-calorie malnutrition, severe   Stage 3b chronic kidney disease (HCC)   Spastic hemiparesis of left nondominant side (HCC)   GI bleed   ABLA (acute blood loss anemia)   Melena   Discharge Instructions:  Allergies as of 11/02/2021       Reactions   Zoloft [sertraline Hcl] Other (See Comments)   Made the patient feel withdrawn and had bad dreams- "Allergic," per MAR   Ivp Dye [iodinated Contrast Media] Rash, Other (See Comments)   "Allergic," per [facility]        Medication List     STOP taking these medications    aspirin 81 MG EC tablet   diphenhydrAMINE 25 mg capsule Commonly known as: BENADRYL   famotidine 20 MG tablet Commonly known as: PEPCID       TAKE these medications    acetaminophen 500 MG tablet Commonly known as: TYLENOL Take 1,000 mg by mouth every 8 (eight) hours as needed for moderate pain.   albuterol 108 (90 Base) MCG/ACT inhaler Commonly known as: VENTOLIN HFA Inhale 2 puffs into the lungs every 4 (four) hours as needed for wheezing or shortness of breath.   ascorbic acid 500 MG tablet Commonly known as: VITAMIN C Take 1 tablet (500 mg total) by mouth daily.   DULoxetine 60 MG capsule Commonly known as: Cymbalta Take 1 capsule (60 mg total) by mouth daily.   Ensure Plus High Protein Liqd Take 237 mLs by mouth 3 (three) times daily.   ferrous sulfate 325 (65 FE) MG tablet Take 325 mg by mouth daily with breakfast.   fexofenadine 180 MG tablet Commonly known as: ALLEGRA Take 180 mg by mouth daily as needed  for allergies.   folic acid 1 MG tablet Commonly known as: FOLVITE TAKE 1 TABLET(1 MG) BY MOUTH DAILY   levothyroxine 100 MCG tablet Commonly known as: SYNTHROID Take 100 mcg by mouth daily before breakfast. What changed: Another medication with the same name was removed. Continue taking this medication, and follow the directions you see here.   Magnesium 250 MG Tabs Take 1 tablet (250 mg total) by mouth 2 (two) times daily.   multivitamin with minerals Tabs tablet Take 1 tablet by mouth daily.   pantoprazole 40 MG tablet Commonly known as: Protonix Take 1 tablet (40 mg total) by mouth 2 (two) times daily.   rosuvastatin 20 MG tablet Commonly known as: CRESTOR Take 1 tablet by mouth daily (Stop Pravastatin) What changed:  how much to take how to take this when to take this additional instructions   thiamine 100 MG tablet Take 1 tablet (100 mg total) by mouth daily.   topiramate 50 MG tablet Commonly known as: Topamax Take 1 tablet (50 mg total) by mouth 2 (two) times daily.   traZODone 50 MG tablet Commonly known as: DESYREL TAKE 1/2 TO 1 TABLET BY MOUTH  EVERY NIGHT FOR SLEEP   vitamin B-12 500 MCG tablet Commonly known as: CYANOCOBALAMIN Take 1,000 mcg by mouth daily.   zinc sulfate 220 (50 Zn) MG capsule Take 1 capsule (220 mg total) by mouth daily.        Follow-up Information     [name], [name], MD. Schedule an appointment as soon as possible for a visit in 1 week(s).   Specialty: Family Medicine Why: Hospital Follow Up Contact information: [place] [address] [phone]         Eloise Harman, DO. Schedule an appointment as soon as possible for a visit in 2 month(s).   Specialty: Gastroenterology Why: Hospital Follow Up Contact information: [place] [address] [phone]                Allergies  Allergen Reactions   Zoloft [Sertraline Hcl] Other (See Comments)    Made the patient  feel withdrawn and had bad dreams- "Allergic," per [facility]   Ivp Dye [Iodinated Contrast Media] Rash and Other (See Comments)    "Allergic," per [facility]   Allergies as of 11/02/2021       Reactions   Zoloft [sertraline Hcl] Other (See Comments)   Made the patient feel withdrawn and had bad dreams- "Allergic," per MAR   Ivp Dye [iodinated Contrast Media] Rash, Other (See Comments)   "Allergic," per [facility]        Medication List     STOP taking these medications    aspirin 81 MG EC tablet   diphenhydrAMINE 25 mg capsule Commonly known as: BENADRYL   famotidine 20 MG tablet Commonly known as: PEPCID       TAKE these medications    acetaminophen 500 MG tablet Commonly known as: TYLENOL Take 1,000 mg by mouth every 8 (eight) hours as needed for moderate pain.   albuterol 108 (90 Base) MCG/ACT inhaler Commonly known as: VENTOLIN HFA Inhale 2 puffs into the lungs every 4 (four) hours as needed for wheezing or shortness of breath.   ascorbic acid 500 MG tablet Commonly known as: VITAMIN C Take 1 tablet (500 mg total) by mouth daily.   DULoxetine 60 MG capsule Commonly known as: Cymbalta Take 1 capsule (60 mg total) by mouth daily.   Ensure Plus High Protein Liqd Take 237 mLs by mouth 3 (three) times daily.   ferrous sulfate 325 (65 FE) MG tablet Take 325 mg by mouth daily with breakfast.   fexofenadine 180 MG tablet Commonly known as: ALLEGRA Take 180 mg by mouth daily as needed for allergies.   folic acid 1 MG tablet Commonly known as: FOLVITE TAKE 1 TABLET(1 MG) BY MOUTH DAILY   levothyroxine 100 MCG tablet Commonly known as: SYNTHROID Take 100 mcg by mouth daily before breakfast. What changed: Another medication with the same name was removed. Continue taking this medication, and follow the directions you see here.   Magnesium 250 MG Tabs Take 1 tablet (250 mg total) by mouth 2 (two) times daily.   multivitamin with minerals Tabs tablet Take 1 tablet by mouth  daily.   pantoprazole 40 MG tablet Commonly known as: Protonix Take 1 tablet (40 mg total) by mouth 2 (two) times daily.   rosuvastatin 20 MG tablet Commonly known as: CRESTOR Take 1 tablet by mouth daily (Stop Pravastatin) What changed:  how much to take how to take this when to take this additional instructions   thiamine 100 MG tablet Take 1  tablet (100 mg total) by mouth daily.   topiramate 50 MG tablet Commonly known as: Topamax Take 1 tablet (50 mg total) by mouth 2 (two) times daily.   traZODone 50 MG tablet Commonly known as: DESYREL TAKE 1/2 TO 1 TABLET BY MOUTH EVERY NIGHT FOR SLEEP   vitamin B-12 500 MCG tablet Commonly known as: CYANOCOBALAMIN Take 1,000 mcg by mouth daily.   zinc sulfate 220 (50 Zn) MG capsule Take 1 capsule (220 mg total) by mouth daily.        Procedures/Studies: No results found.   Subjective: Pt without any specific complaints.  Agreeable to going back to ALF today.    Discharge Exam: Vitals:   11/01/21 2036 11/02/21 0600  BP: (!) 129/94 (!) 125/94  Pulse: (!) 101 (!) 55  Resp: 19 18  Temp: 98.2 F (36.8 C) 99.9 F (37.7 C)  SpO2: 99% 99%   Vitals:   11/01/21 1630 11/01/21 1659 11/01/21 2036 11/02/21 0600  BP: (!) 149/76 130/89 (!) 129/94 (!) 125/94  Pulse: 98 85 (!) 101 (!) 55  Resp:  16 19 18   Temp:  98.6 F (37 C) 98.2 F (36.8 C) 99.9 F (37.7 C)  TempSrc:  Oral Oral Oral  SpO2: 97% 100% 99% 99%  Weight:      Height:       General: Pt is alert, awake, not in acute distress Cardiovascular: normal S1/S2 +, no rubs, no gallops Respiratory: CTA bilaterally, no wheezing, no rhonchi Abdominal: Soft, NT, ND, bowel sounds + Extremities: no edema, no cyanosis   The results of significant diagnostics from this hospitalization (including imaging, microbiology, ancillary and laboratory) are listed below for reference.     Microbiology: Recent Results (from the past 240 hour(s))  Resp Panel by RT-PCR (Flu A&B,  Covid) Nasopharyngeal Swab     Status: None   Collection Time: 10/31/21  6:50 PM   Specimen: Nasopharyngeal Swab; Nasopharyngeal(NP) swabs in vial transport medium  Result Value Ref Range Status   SARS Coronavirus 2 by RT PCR NEGATIVE NEGATIVE Final    Comment: (NOTE) SARS-CoV-2 target nucleic acids are NOT DETECTED.  The SARS-CoV-2 RNA is generally detectable in upper respiratory specimens during the acute phase of infection. The lowest concentration of SARS-CoV-2 viral copies this assay can detect is 138 copies/mL. A negative result does not preclude SARS-Cov-2 infection and should not be used as the sole basis for treatment or other patient management decisions. A negative result may occur with  improper specimen collection/handling, submission of specimen other than nasopharyngeal swab, presence of viral mutation(s) within the areas targeted by this assay, and inadequate number of viral copies(<138 copies/mL). A negative result must be combined with clinical observations, patient history, and epidemiological information. The expected result is Negative.  Fact Sheet for Patients:  EntrepreneurPulse.com.au  Fact Sheet for Healthcare Providers:  IncredibleEmployment.be  This test is no t yet approved or cleared by the [place] FDA and  has been authorized for detection and/or diagnosis of SARS-CoV-2 by FDA under an Emergency Use Authorization (EUA). This EUA will remain  in effect (meaning this test can be used) for the duration of the COVID-19 declaration under Section 564(b)(1) of the Act, 21 U.S.C.section 360bbb-3(b)(1), unless the authorization is terminated  or revoked sooner.       Influenza A by PCR NEGATIVE NEGATIVE Final   Influenza B by PCR NEGATIVE NEGATIVE Final    Comment: (NOTE) The Xpert Xpress SARS-CoV-2/FLU/RSV plus assay is intended as an aid in  the diagnosis of influenza from Nasopharyngeal swab specimens and should  not be used as a sole basis for treatment. Nasal washings and aspirates are unacceptable for Xpert Xpress SARS-CoV-2/FLU/RSV testing.  Fact Sheet for Patients: EntrepreneurPulse.com.au  Fact Sheet for Healthcare Providers: IncredibleEmployment.be  This test is not yet approved or cleared by the [place] FDA and has been authorized for detection and/or diagnosis of SARS-CoV-2 by FDA under an Emergency Use Authorization (EUA). This EUA will remain in effect (meaning this test can be used) for the duration of the COVID-19 declaration under Section 564(b)(1) of the Act, 21 U.S.C. section 360bbb-3(b)(1), unless the authorization is terminated or revoked.  Performed at [facility], [address], [place], [place] [address]      Labs: BNP (last 3 results) No results for input(s): BNP in the last 8760 hours. Basic Metabolic Panel: Recent Labs  Lab 10/27/21 1128 10/31/21 1511 11/01/21 0626 11/02/21 0529  NA 145* 140 138 140  K 4.3 4.3 4.3 4.0  CL 110* 110 108 111  CO2 21 23 21* 19*  GLUCOSE 122* 103* 93 83  BUN 24 27* 29* 32*  CREATININE 1.62* 1.43* 1.27* 1.41*  CALCIUM 10.6* 10.1 9.7 9.8   Liver Function Tests: Recent Labs  Lab 10/27/21 1128 10/31/21 1511  AST 18 18  ALT 12 13  ALKPHOS 80 70  BILITOT <0.2 0.3  PROT 6.2 6.4*  ALBUMIN 4.0 3.4*   No results for input(s): LIPASE, AMYLASE in the last 168 hours. No results for input(s): AMMONIA in the last 168 hours. CBC: Recent Labs  Lab 10/27/21 1128 10/31/21 1511 11/01/21 0626 11/02/21 0529  WBC 6.5 7.5 7.8 7.7  NEUTROABS 4.4  --   --   --   HGB 6.0* 6.2* 9.9* 9.5*  HCT 20.0* 21.2* 31.7* 30.4*  MCV 92 95.5 91.4 88.4  PLT 443 418* 301 323   Cardiac Enzymes: Recent Labs  Lab 10/27/21 1128  CKTOTAL 62  CKMBINDEX 2.3   BNP: Invalid input(s): POCBNP CBG: Recent Labs  Lab 11/01/21 1500  GLUCAP 82   D-Dimer No results for input(s): DDIMER in the last 72  hours. Hgb A1c No results for input(s): HGBA1C in the last 72 hours. Lipid Profile No results for input(s): CHOL, HDL, LDLCALC, TRIG, CHOLHDL, LDLDIRECT in the last 72 hours. Thyroid function studies No results for input(s): TSH, T4TOTAL, T3FREE, THYROIDAB in the last 72 hours.  Invalid input(s): FREET3 Anemia work up Recent Labs    10/31/21 1904  VITAMINB12 1,431*  FOLATE 50.4  FERRITIN 97  TIBC 286  IRON 14*  RETICCTPCT 4.2*   Urinalysis    Component Value Date/Time   COLORURINE YELLOW 07/07/2021 1622   APPEARANCEUR CLEAR 07/07/2021 1622   LABSPEC 1.016 07/07/2021 1622   PHURINE 5.0 07/07/2021 1622   GLUCOSEU NEGATIVE 07/07/2021 1622   HGBUR NEGATIVE 07/07/2021 1622   BILIRUBINUR NEGATIVE 07/07/2021 1622   KETONESUR NEGATIVE 07/07/2021 1622   PROTEINUR NEGATIVE 07/07/2021 1622   NITRITE NEGATIVE 07/07/2021 1622   LEUKOCYTESUR NEGATIVE 07/07/2021 1622   Sepsis Labs Invalid input(s): PROCALCITONIN,  WBC,  LACTICIDVEN Microbiology Recent Results (from the past 240 hour(s))  Resp Panel by RT-PCR (Flu A&B, Covid) Nasopharyngeal Swab     Status: None   Collection Time: 10/31/21  6:50 PM   Specimen: Nasopharyngeal Swab; Nasopharyngeal(NP) swabs in vial transport medium  Result Value Ref Range Status   SARS Coronavirus 2 by RT PCR NEGATIVE NEGATIVE Final    Comment: (NOTE) SARS-CoV-2 target nucleic acids  are NOT DETECTED.  The SARS-CoV-2 RNA is generally detectable in upper respiratory specimens during the acute phase of infection. The lowest concentration of SARS-CoV-2 viral copies this assay can detect is 138 copies/mL. A negative result does not preclude SARS-Cov-2 infection and should not be used as the sole basis for treatment or other patient management decisions. A negative result may occur with  improper specimen collection/handling, submission of specimen other than nasopharyngeal swab, presence of viral mutation(s) within the areas targeted by this assay,  and inadequate number of viral copies(<138 copies/mL). A negative result must be combined with clinical observations, patient history, and epidemiological information. The expected result is Negative.  Fact Sheet for Patients:  EntrepreneurPulse.com.au  Fact Sheet for Healthcare Providers:  IncredibleEmployment.be  This test is no t yet approved or cleared by the [place] FDA and  has been authorized for detection and/or diagnosis of SARS-CoV-2 by FDA under an Emergency Use Authorization (EUA). This EUA will remain  in effect (meaning this test can be used) for the duration of the COVID-19 declaration under Section 564(b)(1) of the Act, 21 U.S.C.section 360bbb-3(b)(1), unless the authorization is terminated  or revoked sooner.       Influenza A by PCR NEGATIVE NEGATIVE Final   Influenza B by PCR NEGATIVE NEGATIVE Final    Comment: (NOTE) The Xpert Xpress SARS-CoV-2/FLU/RSV plus assay is intended as an aid in the diagnosis of influenza from Nasopharyngeal swab specimens and should not be used as a sole basis for treatment. Nasal washings and aspirates are unacceptable for Xpert Xpress SARS-CoV-2/FLU/RSV testing.  Fact Sheet for Patients: EntrepreneurPulse.com.au  Fact Sheet for Healthcare Providers: IncredibleEmployment.be  This test is not yet approved or cleared by the [place] FDA and has been authorized for detection and/or diagnosis of SARS-CoV-2 by FDA under an Emergency Use Authorization (EUA). This EUA will remain in effect (meaning this test can be used) for the duration of the COVID-19 declaration under Section 564(b)(1) of the Act, 21 U.S.C. section 360bbb-3(b)(1), unless the authorization is terminated or revoked.  Performed at [facility], [address] [place], [place] [address]     Time coordinating discharge: 40 mins   SIGNED:  Irwin Brakeman, MD  [facility] 11/02/2021, 11:27 AM How to contact the [facility] Attending or Consulting provider [place] or covering provider during after hours [address], for this patient?  Check the care team in [facility] and look for a) attending/consulting [facility] provider listed and b) the [facility] team listed Log into [email or web address] and use [place]'s universal password to access. If you do not have the password, please contact the hospital operator. Locate the [facility] provider you are looking for under [facility] and page to a number that you can be directly reached. If you still have difficulty reaching the provider, please page the [facility] (Director on Call) for the Hospitalists listed on amion for assistance.

## 2021-11-02 NOTE — Care Management Important Message (Signed)
Important Message  Patient Details  Name: Joanna [name] MRN: [id number] Date of Birth: 12-18-1950   Medicare Important Message Given:  Yes (mailed to address on file)     Tommy Medal 11/02/2021, 11:57 AM

## 2021-11-02 NOTE — Transfer of Care (Signed)
Immediate Anesthesia Transfer of Care Note  Patient: Joanna [name]  Procedure(s) Performed: ESOPHAGOGASTRODUODENOSCOPY (EGD) WITH PROPOFOL ENTEROSCOPY BIOPSY  Patient Location: PACU  Anesthesia Type:General  Level of Consciousness: awake, alert  and oriented  Airway & Oxygen Therapy: Patient Spontanous Breathing and Patient connected to nasal cannula oxygen  Post-op Assessment: Report given to RN  Post vital signs: Reviewed and stable  Last Vitals:  Vitals Value Taken Time  BP 125/94 11/02/21 0600  Temp 37.7 C 11/02/21 0600  Pulse 55 11/02/21 0600  Resp 18 11/02/21 0600  SpO2 99 % 11/02/21 0600    Last Pain:  Vitals:   11/02/21 0745  TempSrc:   PainSc: 0-No pain         Complications: No notable events documented.

## 2021-11-02 NOTE — NC FL2 (Signed)
[place] MEDICAID FL2 LEVEL OF CARE SCREENING TOOL     IDENTIFICATION  Patient Name: Joanna [name] Birthdate: 02-13-1951 Sex: female Admission Date (Current Location): 10/31/2021  [facility] and [place] Number:  [employer] and Address:  [place] [address] [place]      Provider Number: [id number]  Attending Physician Name and Address:  Murlean Iba, MD  Relative Name and Phone Number:       Current Level of Care: Hospital Recommended Level of Care: [facility] Prior Approval Number:    Date Approved/Denied:   PASRR Number:    Discharge Plan: Other (Comment) ([place])    Current Diagnoses: Patient Active Problem List   Diagnosis Date Noted   ABLA (acute blood loss anemia) 11/01/2021   Melena    Blood loss anemia 10/31/2021   GI bleed 10/31/2021   Spastic hemiparesis of left nondominant side ([place]) 10/27/2021   Stage 3b chronic kidney disease ([place]) 08/23/2021   Left-sided weakness    COVID    Protein-calorie malnutrition, severe 07/16/2021   Altered mental status    Acute renal failure superimposed on stage 4 chronic kidney disease ([place]) 06/26/2021   Dehydration 06/25/2021   Fall at home, initial encounter 06/25/2021   Hypernatremia 06/25/2021   Dizziness and giddiness 04/19/2021   Hypomagnesemia 04/19/2021   Hypokalemia 04/19/2021   Near syncope 04/19/2021   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 ([place]) 01/19/2021   Non-recurrent acute suppurative otitis media of left ear without spontaneous rupture of tympanic membrane 01/06/2021   Seasonal allergic rhinitis due to pollen 01/06/2021   Abnormal CT scan, stomach 02/06/2020   Coronary artery disease due to lipid rich plaque 11/19/2019   Aortic atherosclerosis ([place]) 11/19/2019   Constipation 10/06/2019   AVM (arteriovenous malformation) of small bowel, acquired    Pain in joint of left shoulder 05/13/2019   Pain  of left hand 04/24/2019   Adhesive capsulitis of left shoulder 04/16/2019   Pain in left foot 12/06/2018   IDA (iron deficiency anemia) 12/03/2018   Left hand weakness 08/16/2018   Numbness 07/10/2018   Sleep apnea 12/25/2017   Right carpal tunnel syndrome 12/24/2017   Chronic anxiety 02/13/2017   Right rotator cuff tendinitis 02/11/2015   Iron deficiency 08/25/2014   Microcytic anemia 06/17/2014   Insomnia 05/04/2014   Hypothyroidism 06/05/2013   Essential hypertension, benign 06/05/2013   Stress fracture of right foot 02/27/2013   Chronic radicular lumbar pain 02/27/2013   Hyperlipidemia 01/16/2013   Type 2 diabetes mellitus ([place]) 01/16/2013    Orientation RESPIRATION BLADDER Height & Weight     Self  Normal Incontinent Weight: 108 lb 3.9 oz (49.1 kg) Height:  5\' 2"  (157.5 cm)  BEHAVIORAL SYMPTOMS/MOOD NEUROLOGICAL BOWEL NUTRITION STATUS      Incontinent    AMBULATORY STATUS COMMUNICATION OF NEEDS Skin   Total Care   Other (Comment) (Small abrasion to coccyx.)                       Personal Care Assistance Level of Assistance  Bathing, Feeding, Dressing, Total care           Functional Limitations Info  Sight, Hearing, Speech Sight Info: Adequate Hearing Info: Adequate Speech Info: Adequate    SPECIAL CARE FACTORS FREQUENCY                       Contractures  Additional Factors Info  Code Status, Allergies, Psychotropic Code Status Info: Full code Allergies Info: Zoloft (sertraline Hcl), Ivp Dye (iodinated Contrast Media) Psychotropic Info: Trazodone         Current Medications (11/02/2021):  This is the current hospital active medication list Current Facility-Administered Medications  Medication Dose Route Frequency Provider Last Rate Last Admin   acetaminophen (TYLENOL) tablet 650 mg  650 mg Oral Q6H PRN Emeterio Reeve, DO       Or   acetaminophen (TYLENOL) suppository 650 mg  650 mg Rectal Q6H PRN Emeterio Reeve, DO        albuterol (PROVENTIL) (2.5 MG/3ML) 0.083% nebulizer solution 2.5 mg  2.5 mg Inhalation Q4H PRN Emeterio Reeve, DO       ascorbic acid (VITAMIN C) tablet 500 mg  500 mg Oral Daily Emeterio Reeve, DO   500 mg at 11/02/21 1751   bisacodyl (DULCOLAX) EC tablet 5 mg  5 mg Oral Daily PRN Emeterio Reeve, DO       diphenhydrAMINE (BENADRYL) capsule 25 mg  25 mg Oral Q8H PRN Emeterio Reeve, DO       DULoxetine (CYMBALTA) DR capsule 60 mg  60 mg Oral Daily Emeterio Reeve, DO   60 mg at 11/02/21 0258   feeding supplement (ENSURE ENLIVE / ENSURE PLUS) liquid 237 mL  237 mL Oral TID Emeterio Reeve, DO   237 mL at 11/02/21 5277   ferrous sulfate tablet 325 mg  325 mg Oral Q breakfast Emeterio Reeve, DO   325 mg at 82/42/35 3614   folic acid (FOLVITE) tablet 1 mg  1 mg Oral Daily Emeterio Reeve, DO   1 mg at 11/02/21 4315   levothyroxine (SYNTHROID) tablet 125 mcg  125 mcg Oral Q0600 Emeterio Reeve, DO   125 mcg at 11/02/21 4008   loratadine (CLARITIN) tablet 10 mg  10 mg Oral Daily Emeterio Reeve, DO   10 mg at 11/02/21 6761   magnesium oxide (MAG-OX) tablet 200 mg  200 mg Oral BID Emeterio Reeve, DO   200 mg at 11/02/21 0940   methocarbamol (ROBAXIN) 500 mg in dextrose 5 % 50 mL IVPB  500 mg Intravenous Q6H PRN Emeterio Reeve, DO       multivitamin with minerals tablet 1 tablet  1 tablet Oral Daily Emeterio Reeve, DO   1 tablet at 11/02/21 9509   oxyCODONE (Oxy IR/ROXICODONE) immediate release tablet 5 mg  5 mg Oral Q4H PRN Emeterio Reeve, DO       pantoprazole (PROTONIX) injection 40 mg  40 mg Intravenous Q12H Aliene Altes S, PA-C   40 mg at 11/02/21 3267   rosuvastatin (CRESTOR) tablet 20 mg  20 mg Oral Daily Emeterio Reeve, DO   20 mg at 11/02/21 1245   thiamine tablet 100 mg  100 mg Oral Daily Emeterio Reeve, DO   100 mg at 11/02/21 8099   topiramate (TOPAMAX) tablet 50 mg  50 mg Oral BID Emeterio Reeve, DO   50 mg at 11/02/21 8338    traZODone (DESYREL) tablet 25 mg  25 mg Oral QHS Emeterio Reeve, DO   25 mg at 11/01/21 2154   vitamin B-12 (CYANOCOBALAMIN) tablet 1,000 mcg  1,000 mcg Oral Daily Emeterio Reeve, DO   1,000 mcg at 11/02/21 2505   zinc sulfate capsule 220 mg  220 mg Oral Daily Emeterio Reeve, DO   220 mg at 11/02/21 3976     Discharge Medications: TAKE these medications     acetaminophen 500 MG tablet  Commonly known as: TYLENOL Take 1,000 mg by mouth every 8 (eight) hours as needed for moderate pain.    albuterol 108 (90 Base) MCG/ACT inhaler Commonly known as: VENTOLIN HFA Inhale 2 puffs into the lungs every 4 (four) hours as needed for wheezing or shortness of breath.    ascorbic acid 500 MG tablet Commonly known as: VITAMIN C Take 1 tablet (500 mg total) by mouth daily.    DULoxetine 60 MG capsule Commonly known as: Cymbalta Take 1 capsule (60 mg total) by mouth daily.    Ensure Plus High Protein Liqd Take 237 mLs by mouth 3 (three) times daily.    ferrous sulfate 325 (65 FE) MG tablet Take 325 mg by mouth daily with breakfast.    fexofenadine 180 MG tablet Commonly known as: ALLEGRA Take 180 mg by mouth daily as needed for allergies.    folic acid 1 MG tablet Commonly known as: FOLVITE TAKE 1 TABLET(1 MG) BY MOUTH DAILY    levothyroxine 100 MCG tablet Commonly known as: SYNTHROID Take 100 mcg by mouth daily before breakfast. What changed: Another medication with the same name was removed. Continue taking this medication, and follow the directions you see here.    Magnesium 250 MG Tabs Take 1 tablet (250 mg total) by mouth 2 (two) times daily.    multivitamin with minerals Tabs tablet Take 1 tablet by mouth daily.    pantoprazole 40 MG tablet Commonly known as: Protonix Take 1 tablet (40 mg total) by mouth 2 (two) times daily.    rosuvastatin 20 MG tablet Commonly known as: CRESTOR Take 1 tablet by mouth daily (Stop Pravastatin) What changed:  how much to  take how to take this when to take this additional instructions    thiamine 100 MG tablet Take 1 tablet (100 mg total) by mouth daily.    topiramate 50 MG tablet Commonly known as: Topamax Take 1 tablet (50 mg total) by mouth 2 (two) times daily.    traZODone 50 MG tablet Commonly known as: DESYREL TAKE 1/2 TO 1 TABLET BY MOUTH EVERY NIGHT FOR SLEEP    vitamin B-12 500 MCG tablet Commonly known as: CYANOCOBALAMIN Take 1,000 mcg by mouth daily.    zinc sulfate 220 (50 Zn) MG capsule Take 1 capsule (220 mg total) by mouth daily.    Relevant Imaging Results:  Relevant Lab Results:   Additional Information    Shaneisha Burkel, Clydene Pugh, LCSW

## 2021-11-02 NOTE — Progress Notes (Signed)
Subjective: Patient denies pain this morning. She states she has not had any BMs today. She has not had any nausa or vomiting. Patient unable to provide much history.   Per patient's nurse, one BM last night, no documentation of melena or rectal bleeding, no BMs this morning.   Objective: Vital signs in last 24 hours: Temp:  [98.2 F (36.8 C)-99.9 F (37.7 C)] 99.9 F (37.7 C) (01/18 0600) Pulse Rate:  [55-101] 55 (01/18 0600) Resp:  [15-19] 18 (01/18 0600) BP: (125-175)/(64-99) 125/94 (01/18 0600) SpO2:  [97 %-100 %] 99 % (01/18 0600) Weight:  [49.1 kg] 49.1 kg (01/17 1446) Last BM Date: 11/02/21 General:   Alert and pleasant, NAD Head:  Normocephalic and atraumatic. Eyes:  No icterus, sclera clear. Conjuctiva pink.  Mouth:  Without lesions, mucosa pink and moist.  Heart:  S1, S2 present, no murmurs noted.  Lungs: Clear to auscultation bilaterally, without wheezing, rales, or rhonchi.  Abdomen:  Bowel sounds present, soft, non-tender, non-distended. No HSM or hernias noted. No rebound or guarding. No masses appreciated  Msk:  contracture of Left arm/ hand Pulses:  Normal pulses noted. Extremities:  Without clubbing or edema. Neurologic:  Alert  grossly normal neurologically. A/O to person, place, time, not situation Skin:  Warm and dry, intact without significant lesions. Psych:  Alert. Flat affect  Intake/Output from previous day: 01/17 0701 - 01/18 0700 In: 300 [P.O.:100; I.V.:200] Out: -   Lab Results: Recent Labs    10/31/21 1511 11/01/21 0626 11/02/21 0529  WBC 7.5 7.8 7.7  HGB 6.2* 9.9* 9.5*  HCT 21.2* 31.7* 30.4*  PLT 418* 301 323   BMET Recent Labs    10/31/21 1511 11/01/21 0626 11/02/21 0529  NA 140 138 140  K 4.3 4.3 4.0  CL 110 108 111  CO2 23 21* 19*  GLUCOSE 103* 93 83  BUN 27* 29* 32*  CREATININE 1.43* 1.27* 1.41*  CALCIUM 10.1 9.7 9.8   LFT Recent Labs    10/31/21 1511  PROT 6.4*  ALBUMIN 3.4*  AST 18  ALT 13  ALKPHOS 62   BILITOT 0.3    Assessment: 71 year old female with history of DM, Hypothyroidism, HLD, CKD, 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 Hgb of 6.2 in ED, FOBT positive, iron 14, Saturation 5%, Ferritin 97. BUN 27 in setting of CKD with creatnine 1.43. she received 2 units PRBCs with hgb improvement to 9.9. GI consulted for further evaluation.  Anemia: recent melena prior to admission, reported by patient, no reports of melena or BRBPR by facility at which she resides. Patient underwent EGD+small bowel endoscopy yesterday with findings of- Non-bleeding gastric ulcers with a clean ulcer base (For[place] Class III)/some associated erosions in the antrum, recommendations to proceed with PPI BID, restart PO iron, and repeat small bowel enteroscopy in 3 months to evaluate for healing of ulcers. Hgb 9.5 today, down slightly from 9.9 yesterday though still relatively stable. No BMs since last night, no rectal bleeding or melena reported.   Plan: Continue to monitor for overt GI bleeding Monitor H&H daily Continue PPI BID, will need to remain on this on outpatient basis as well  Repeat small bowel enteroscopy  in 3 months Avoid NSAIDs Continue with PO iron    LOS: 2 days    11/02/2021, 8:56 AM  Norman Piacentini L. Alver Sorrow, MSN, APRN, AGNP-C Adult-Gerontology Nurse Practitioner [facility]

## 2021-11-02 NOTE — Progress Notes (Signed)
Patient discharged today back to facility [facility]) today, transported by facility. Discharge paperwork given to facility driving to give to facility. Belonging sent with patient.

## 2021-11-02 NOTE — TOC Transition Note (Signed)
Transition of Care [facility]) - CM/SW Discharge Note   Patient Details  Name: Joanna [name] MRN: [id number] Date of Birth: 11/10/1950  [facility]) CM/SW Contact:  Ihor Gully, LCSW Phone Number: 11/02/2021, 3:21 PM   Clinical Narrative:    Discharge clinicals sent to facility. Facility to transport patient. TOC signing off.      Barriers to Discharge: No Barriers Identified   Patient Goals and CMS Choice Patient states their goals for this hospitalization and ongoing recovery are:: return to [facility]   Choice offered to / list presented to : Adult Children  Discharge Placement              Patient chooses bed at: [facility] Patient to be transferred to facility by: Facility Name of family member notified: Carin Primrose, nephew Patient and family notified of of transfer: 11/02/21  Discharge Plan and Services In-house Referral: Clinical Social Work   Post Acute Care Choice: Resumption of Svcs/PTA Provider                               Social Determinants of Health (SDOH) Interventions     Readmission Risk Interventions Readmission Risk Prevention Plan 11/01/2021 06/27/2021  Transportation Screening Complete Complete  HRI or [place] Complete Complete  Social Work Consult for [place] Planning/Counseling Complete Complete  Palliative Care Screening Not Applicable Not Applicable  Medication Review [employer]) Complete Complete  Some recent data might be hidden

## 2021-11-03 ENCOUNTER — Telehealth: Payer: Self-pay

## 2021-11-03 DIAGNOSIS — D62 Acute posthemorrhagic anemia: Secondary | ICD-10-CM | POA: Diagnosis not present

## 2021-11-03 DIAGNOSIS — K922 Gastrointestinal hemorrhage, unspecified: Secondary | ICD-10-CM | POA: Diagnosis not present

## 2021-11-03 NOTE — Telephone Encounter (Signed)
Transitions of care Nurses-please try to reach the patient at [facility] care-I believe that is who is taking care of her [facility] [phone] She will need to be a follow-up visit next week at 11:20 AM if possible for a hospital follow-up from pneumonia

## 2021-11-03 NOTE — Telephone Encounter (Signed)
Transition Care Management Follow-up Telephone Call Date of discharge and from where: 11/02/21 Joanna [name] Diagnosis: GI Bleed How have you been since you were released from the hospital? Spoke with pt's sister, [name], and she states pt is still weak. Any questions or concerns? No  Items Reviewed: Did the pt receive and understand the discharge instructions provided? Yes  Medications obtained and verified? Yes  Other? No  Any new allergies since your discharge? No  Dietary orders reviewed? Yes Do you have support at home? Yes   Home Care and Equipment/Supplies: Were home health services ordered? Pt currently at [facility]. If so, what is the name of the agency? N/A  Has the agency set up a time to come to the patient's home? not applicable Were any new equipment or medical supplies ordered?  No What is the name of the medical supply agency? N/a Were you able to get the supplies/equipment? not applicable Do you have any questions related to the use of the equipment or supplies? No  Functional Questionnaire: (I = Independent and D = Dependent) ADLs: D  Bathing/Dressing- D  Meal Prep- D  Eating- I  Maintaining continence- I  Transferring/Ambulation- I  Managing Meds- D  Follow up appointments reviewed:  PCP Hospital f/u appt confirmed? Yes  Scheduled to see Dr. Wolfgang Phoenix on 11/07/21 @ 11:20. [facility] f/u appt confirmed?  To see Dr. Gwenlyn Saran in 2 weeks.   Are transportation arrangements needed? No  If their condition worsens, is the pt aware to call PCP or go to the Emergency Dept.? Yes Was the patient provided with contact information for the PCP's office or ED? Yes Was to pt encouraged to call back with questions or concerns? Yes

## 2021-11-03 NOTE — Telephone Encounter (Signed)
Transition Care Management Unsuccessful Follow-up Telephone Call  Date of discharge and from where:  11/02/21 Joanna [name]  Diagnosis: GI Bleed   Attempts:  1st Attempt  Reason for unsuccessful TCM follow-up call:  Unable to leave message

## 2021-11-04 LAB — SURGICAL PATHOLOGY

## 2021-11-04 NOTE — Telephone Encounter (Signed)
We will do so-we were holding it until her visit thank you

## 2021-11-04 NOTE — Telephone Encounter (Signed)
[facility] is checking out form he down off to be completed on patient. He would like to pick up on 1/23 at her appointment

## 2021-11-07 ENCOUNTER — Inpatient Hospital Stay: Payer: PPO | Admitting: Family Medicine

## 2021-11-07 ENCOUNTER — Encounter (INDEPENDENT_AMBULATORY_CARE_PROVIDER_SITE_OTHER): Payer: Self-pay | Admitting: *Deleted

## 2021-11-11 ENCOUNTER — Other Ambulatory Visit: Payer: Self-pay | Admitting: Family Medicine

## 2021-11-14 ENCOUNTER — Other Ambulatory Visit: Payer: Self-pay | Admitting: Family Medicine

## 2021-11-15 ENCOUNTER — Other Ambulatory Visit: Payer: Self-pay | Admitting: *Deleted

## 2021-11-16 ENCOUNTER — Ambulatory Visit (INDEPENDENT_AMBULATORY_CARE_PROVIDER_SITE_OTHER): Payer: PPO | Admitting: Family Medicine

## 2021-11-16 ENCOUNTER — Other Ambulatory Visit: Payer: Self-pay

## 2021-11-16 VITALS — BP 122/82

## 2021-11-16 DIAGNOSIS — D500 Iron deficiency anemia secondary to blood loss (chronic): Secondary | ICD-10-CM | POA: Diagnosis not present

## 2021-11-16 DIAGNOSIS — N289 Disorder of kidney and ureter, unspecified: Secondary | ICD-10-CM | POA: Diagnosis not present

## 2021-11-16 DIAGNOSIS — F0390 Unspecified dementia without behavioral disturbance: Secondary | ICD-10-CM | POA: Diagnosis not present

## 2021-11-16 DIAGNOSIS — E039 Hypothyroidism, unspecified: Secondary | ICD-10-CM

## 2021-11-16 DIAGNOSIS — G8190 Hemiplegia, unspecified affecting unspecified side: Secondary | ICD-10-CM | POA: Diagnosis not present

## 2021-11-16 NOTE — Progress Notes (Addendum)
° °  Subjective:    Patient ID: Joanna [name], female    DOB: Feb 04, 1951, 71 y.o.   MRN: [id number]  HPI  Patient arrives for a follow up. Patient also having problems with right knee pain Patient has had a significant decline in her function She has contractures in the left arm. She is not eating well Her weight is gone down Cognitively she is having a more difficult time She was recently in the hospital with a GI bleed She is currently stays in a rest home she is not capable of taking care of herself Although she is aware of who I am and where she is at the same time she does not have full understanding of what is going on.  Is not able to readily respond.  In my opinion I do not feel that she is capable of controlling her finances  It should be noted that her oral intake is not good they have to feed her. At times she does not respond to them as well as she should  Review of Systems     Objective:   Physical Exam  Contractures of the left arm Right arm weak Both legs are weak with muscle atrophy Lungs are clear hearts regular Patient can state that occasionally yes to a question but otherwise is noncommunicative.  She can look at me after I get her attention but otherwise she seems to be zoned out.      Assessment & Plan:  1. Anemia due to GI blood loss Recommend checking CBC.  Stay away from all anti-inflammatories and aspirin currently. - CBC with Differential - Basic Metabolic Panel (BMET) - TSH - T4, free  2. Dementia without behavioral disturbance, psychotic disturbance, mood disturbance, or anxiety, unspecified dementia severity, unspecified dementia type (HCC) Moderate progressive dementia issues I do not feel that the patient is capable of caring for herself very necessary for her to be in complete care   - CBC with Differential - Basic Metabolic Panel (BMET) - TSH - T4, free  3. Renal insufficiency Renal acute kidney functions continue current medications  currently - CBC with Differential - Basic Metabolic Panel (BMET) - TSH - T4, free  4. Hypothyroidism, unspecified type Check thyroid function continue current dosing follow-up patient in approximately 4 weeks - CBC with Differential - Basic Metabolic Panel (BMET) - TSH - T4, free  Neurodegenerative disease-this started off with left arm weakness but has progressed to the point where she is now wheelchair-bound.  She has weakness in both arms with minimal grip worse on the left side than the right side the left side has contractures the legs have significant weakness bilateral-she is seen specialist in the past.  There is no further treatment they can offer her.  This is not curable.  It is progressive.  Her lifespan is very limited.  It is reasonable for family to strongly consider hospice services.

## 2021-11-17 LAB — BASIC METABOLIC PANEL
BUN/Creatinine Ratio: 24 (ref 12–28)
BUN: 31 mg/dL — ABNORMAL HIGH (ref 8–27)
CO2: 20 mmol/L (ref 20–29)
Calcium: 11.6 mg/dL — ABNORMAL HIGH (ref 8.7–10.3)
Chloride: 106 mmol/L (ref 96–106)
Creatinine, Ser: 1.28 mg/dL — ABNORMAL HIGH (ref 0.57–1.00)
Glucose: 117 mg/dL — ABNORMAL HIGH (ref 70–99)
Potassium: 4.5 mmol/L (ref 3.5–5.2)
Sodium: 141 mmol/L (ref 134–144)
eGFR: 45 mL/min/{1.73_m2} — ABNORMAL LOW (ref >59)

## 2021-11-17 LAB — CBC WITH DIFFERENTIAL/PLATELET
Basophils Absolute: 0.1 10*3/uL (ref 0.0–0.2)
Basos: 1 %
EOS (ABSOLUTE): 0.1 10*3/uL (ref 0.0–0.4)
Eos: 1 %
Hematocrit: 32.9 % — ABNORMAL LOW (ref 34.0–46.6)
Hemoglobin: 10.5 g/dL — ABNORMAL LOW (ref 11.1–15.9)
Immature Grans (Abs): 0 10*3/uL (ref 0.0–0.1)
Immature Granulocytes: 0 %
Lymphocytes Absolute: 1.7 10*3/uL (ref 0.7–3.1)
Lymphs: 18 %
MCH: 26.9 pg (ref 26.6–33.0)
MCHC: 31.9 g/dL (ref 31.5–35.7)
MCV: 84 fL (ref 79–97)
Monocytes Absolute: 0.4 10*3/uL (ref 0.1–0.9)
Monocytes: 4 %
Neutrophils Absolute: 7.3 10*3/uL — ABNORMAL HIGH (ref 1.4–7.0)
Neutrophils: 76 %
Platelets: 489 10*3/uL — ABNORMAL HIGH (ref 150–450)
RBC: 3.9 x10E6/uL (ref 3.77–5.28)
RDW: 14.8 % (ref 11.7–15.4)
WBC: 9.6 10*3/uL (ref 3.4–10.8)

## 2021-11-17 LAB — TSH: TSH: 45.2 u[IU]/mL — ABNORMAL HIGH (ref 0.450–4.500)

## 2021-11-17 LAB — T4, FREE: Free T4: 0.94 ng/dL (ref 0.82–1.77)

## 2021-11-17 MED ORDER — LEVOTHYROXINE SODIUM 150 MCG PO TABS: 150.0000 ug | ORAL_TABLET | Freq: Every day | ORAL | 5 refills | Status: DC

## 2021-11-23 ENCOUNTER — Ambulatory Visit: Payer: PPO | Admitting: Neurology

## 2021-11-29 ENCOUNTER — Ambulatory Visit (HOSPITAL_COMMUNITY): Admission: RE | Admit: 2021-11-29 | Payer: PPO | Source: Ambulatory Visit

## 2021-11-29 ENCOUNTER — Inpatient Hospital Stay (HOSPITAL_COMMUNITY): Payer: PPO | Attending: Hematology

## 2021-12-03 ENCOUNTER — Emergency Department (HOSPITAL_COMMUNITY): Payer: PPO

## 2021-12-03 ENCOUNTER — Other Ambulatory Visit (HOSPITAL_COMMUNITY): Payer: PPO

## 2021-12-03 ENCOUNTER — Other Ambulatory Visit: Payer: Self-pay

## 2021-12-03 ENCOUNTER — Inpatient Hospital Stay (HOSPITAL_COMMUNITY)
Admission: EM | Admit: 2021-12-03 | Discharge: 2021-12-12 | DRG: 640 | Disposition: A | Discharge status: Hospice | Payer: PPO | Source: Skilled Nursing Facility | Attending: Internal Medicine | Admitting: Internal Medicine

## 2021-12-03 DIAGNOSIS — Z20822 Contact with and (suspected) exposure to covid-19: Secondary | ICD-10-CM | POA: Diagnosis present

## 2021-12-03 DIAGNOSIS — E46 Unspecified protein-calorie malnutrition: Secondary | ICD-10-CM | POA: Diagnosis present

## 2021-12-03 DIAGNOSIS — J690 Pneumonitis due to inhalation of food and vomit: Secondary | ICD-10-CM | POA: Diagnosis present

## 2021-12-03 DIAGNOSIS — L89616 Pressure-induced deep tissue damage of right heel: Secondary | ICD-10-CM | POA: Diagnosis present

## 2021-12-03 DIAGNOSIS — R279 Unspecified lack of coordination: Secondary | ICD-10-CM | POA: Diagnosis not present

## 2021-12-03 DIAGNOSIS — L89312 Pressure ulcer of right buttock, stage 2: Secondary | ICD-10-CM | POA: Diagnosis present

## 2021-12-03 DIAGNOSIS — I69354 Hemiplegia and hemiparesis following cerebral infarction affecting left non-dominant side: Secondary | ICD-10-CM | POA: Diagnosis not present

## 2021-12-03 DIAGNOSIS — G4730 Sleep apnea, unspecified: Secondary | ICD-10-CM | POA: Diagnosis present

## 2021-12-03 DIAGNOSIS — E038 Other specified hypothyroidism: Secondary | ICD-10-CM | POA: Diagnosis not present

## 2021-12-03 DIAGNOSIS — Z8249 Family history of ischemic heart disease and other diseases of the circulatory system: Secondary | ICD-10-CM

## 2021-12-03 DIAGNOSIS — L89153 Pressure ulcer of sacral region, stage 3: Secondary | ICD-10-CM | POA: Diagnosis present

## 2021-12-03 DIAGNOSIS — Z7984 Long term (current) use of oral hypoglycemic drugs: Secondary | ICD-10-CM

## 2021-12-03 DIAGNOSIS — E870 Hyperosmolality and hypernatremia: Secondary | ICD-10-CM | POA: Diagnosis present

## 2021-12-03 DIAGNOSIS — Z7189 Other specified counseling: Secondary | ICD-10-CM | POA: Diagnosis not present

## 2021-12-03 DIAGNOSIS — G450 Vertebro-basilar artery syndrome: Secondary | ICD-10-CM | POA: Diagnosis not present

## 2021-12-03 DIAGNOSIS — E785 Hyperlipidemia, unspecified: Secondary | ICD-10-CM | POA: Diagnosis present

## 2021-12-03 DIAGNOSIS — G9341 Metabolic encephalopathy: Secondary | ICD-10-CM | POA: Diagnosis present

## 2021-12-03 DIAGNOSIS — E1122 Type 2 diabetes mellitus with diabetic chronic kidney disease: Secondary | ICD-10-CM | POA: Diagnosis present

## 2021-12-03 DIAGNOSIS — Z833 Family history of diabetes mellitus: Secondary | ICD-10-CM

## 2021-12-03 DIAGNOSIS — E860 Dehydration: Principal | ICD-10-CM | POA: Diagnosis present

## 2021-12-03 DIAGNOSIS — G9340 Encephalopathy, unspecified: Secondary | ICD-10-CM | POA: Diagnosis present

## 2021-12-03 DIAGNOSIS — D631 Anemia in chronic kidney disease: Secondary | ICD-10-CM | POA: Diagnosis present

## 2021-12-03 DIAGNOSIS — Z66 Do not resuscitate: Secondary | ICD-10-CM | POA: Diagnosis present

## 2021-12-03 DIAGNOSIS — Z95828 Presence of other vascular implants and grafts: Secondary | ICD-10-CM

## 2021-12-03 DIAGNOSIS — Z7401 Bed confinement status: Secondary | ICD-10-CM | POA: Diagnosis not present

## 2021-12-03 DIAGNOSIS — N179 Acute kidney failure, unspecified: Secondary | ICD-10-CM | POA: Diagnosis present

## 2021-12-03 DIAGNOSIS — E876 Hypokalemia: Secondary | ICD-10-CM | POA: Diagnosis present

## 2021-12-03 DIAGNOSIS — Z515 Encounter for palliative care: Secondary | ICD-10-CM

## 2021-12-03 DIAGNOSIS — I129 Hypertensive chronic kidney disease with stage 1 through stage 4 chronic kidney disease, or unspecified chronic kidney disease: Secondary | ICD-10-CM | POA: Diagnosis present

## 2021-12-03 DIAGNOSIS — E119 Type 2 diabetes mellitus without complications: Secondary | ICD-10-CM

## 2021-12-03 DIAGNOSIS — Z8051 Family history of malignant neoplasm of kidney: Secondary | ICD-10-CM

## 2021-12-03 DIAGNOSIS — F03A3 Unspecified dementia, mild, with mood disturbance: Secondary | ICD-10-CM | POA: Diagnosis present

## 2021-12-03 DIAGNOSIS — Z83438 Family history of other disorder of lipoprotein metabolism and other lipidemia: Secondary | ICD-10-CM

## 2021-12-03 DIAGNOSIS — F17210 Nicotine dependence, cigarettes, uncomplicated: Secondary | ICD-10-CM | POA: Diagnosis present

## 2021-12-03 DIAGNOSIS — L8990 Pressure ulcer of unspecified site, unspecified stage: Secondary | ICD-10-CM | POA: Diagnosis present

## 2021-12-03 DIAGNOSIS — R601 Generalized edema: Secondary | ICD-10-CM | POA: Diagnosis present

## 2021-12-03 DIAGNOSIS — I672 Cerebral atherosclerosis: Secondary | ICD-10-CM | POA: Diagnosis not present

## 2021-12-03 DIAGNOSIS — G8114 Spastic hemiplegia affecting left nondominant side: Secondary | ICD-10-CM | POA: Diagnosis not present

## 2021-12-03 DIAGNOSIS — R531 Weakness: Secondary | ICD-10-CM | POA: Diagnosis not present

## 2021-12-03 DIAGNOSIS — Z823 Family history of stroke: Secondary | ICD-10-CM

## 2021-12-03 DIAGNOSIS — E11649 Type 2 diabetes mellitus with hypoglycemia without coma: Secondary | ICD-10-CM | POA: Diagnosis not present

## 2021-12-03 DIAGNOSIS — R0902 Hypoxemia: Secondary | ICD-10-CM | POA: Diagnosis not present

## 2021-12-03 DIAGNOSIS — Z8711 Personal history of peptic ulcer disease: Secondary | ICD-10-CM

## 2021-12-03 DIAGNOSIS — N1832 Chronic kidney disease, stage 3b: Secondary | ICD-10-CM | POA: Diagnosis present

## 2021-12-03 DIAGNOSIS — R4182 Altered mental status, unspecified: Secondary | ICD-10-CM | POA: Diagnosis not present

## 2021-12-03 DIAGNOSIS — Z79899 Other long term (current) drug therapy: Secondary | ICD-10-CM

## 2021-12-03 DIAGNOSIS — I10 Essential (primary) hypertension: Secondary | ICD-10-CM | POA: Diagnosis present

## 2021-12-03 DIAGNOSIS — Z90710 Acquired absence of both cervix and uterus: Secondary | ICD-10-CM

## 2021-12-03 DIAGNOSIS — E8809 Other disorders of plasma-protein metabolism, not elsewhere classified: Secondary | ICD-10-CM | POA: Diagnosis present

## 2021-12-03 DIAGNOSIS — Z743 Need for continuous supervision: Secondary | ICD-10-CM | POA: Diagnosis not present

## 2021-12-03 DIAGNOSIS — R159 Full incontinence of feces: Secondary | ICD-10-CM | POA: Diagnosis present

## 2021-12-03 DIAGNOSIS — Z888 Allergy status to other drugs, medicaments and biological substances status: Secondary | ICD-10-CM

## 2021-12-03 DIAGNOSIS — E039 Hypothyroidism, unspecified: Secondary | ICD-10-CM | POA: Diagnosis present

## 2021-12-03 DIAGNOSIS — Z91041 Radiographic dye allergy status: Secondary | ICD-10-CM

## 2021-12-03 DIAGNOSIS — I2510 Atherosclerotic heart disease of native coronary artery without angina pectoris: Secondary | ICD-10-CM | POA: Diagnosis present

## 2021-12-03 DIAGNOSIS — R29818 Other symptoms and signs involving the nervous system: Secondary | ICD-10-CM | POA: Diagnosis not present

## 2021-12-03 DIAGNOSIS — K219 Gastro-esophageal reflux disease without esophagitis: Secondary | ICD-10-CM | POA: Diagnosis present

## 2021-12-03 DIAGNOSIS — Z90722 Acquired absence of ovaries, bilateral: Secondary | ICD-10-CM

## 2021-12-03 LAB — COMPREHENSIVE METABOLIC PANEL
ALT: 17 U/L (ref 0–44)
AST: 21 U/L (ref 15–41)
Albumin: 3.2 g/dL — ABNORMAL LOW (ref 3.5–5.0)
Alkaline Phosphatase: 72 U/L (ref 38–126)
Anion gap: 8 (ref 5–15)
BUN: 11 mg/dL (ref 8–23)
CO2: 20 mmol/L — ABNORMAL LOW (ref 22–32)
Calcium: 9.8 mg/dL (ref 8.9–10.3)
Chloride: 108 mmol/L (ref 98–111)
Creatinine, Ser: 0.69 mg/dL (ref 0.44–1.00)
GFR, Estimated: >60 mL/min (ref >60)
Glucose, Bld: 130 mg/dL — ABNORMAL HIGH (ref 70–99)
Potassium: 3.4 mmol/L — ABNORMAL LOW (ref 3.5–5.1)
Sodium: 136 mmol/L (ref 135–145)
Total Bilirubin: 0.7 mg/dL (ref 0.3–1.2)
Total Protein: 6.5 g/dL (ref 6.5–8.1)

## 2021-12-03 LAB — CBC WITH DIFFERENTIAL/PLATELET
Abs Immature Granulocytes: 0.02 10*3/uL (ref 0.00–0.07)
Basophils Absolute: 0 10*3/uL (ref 0.0–0.1)
Basophils Relative: 0 %
Eosinophils Absolute: 0 10*3/uL (ref 0.0–0.5)
Eosinophils Relative: 0 %
HCT: 36.3 % (ref 36.0–46.0)
Hemoglobin: 12.6 g/dL (ref 12.0–15.0)
Immature Granulocytes: 0 %
Lymphocytes Relative: 23 %
Lymphs Abs: 1.4 10*3/uL (ref 0.7–4.0)
MCH: 33.5 pg (ref 26.0–34.0)
MCHC: 34.7 g/dL (ref 30.0–36.0)
MCV: 96.5 fL (ref 80.0–100.0)
Monocytes Absolute: 0.4 10*3/uL (ref 0.1–1.0)
Monocytes Relative: 6 %
Neutro Abs: 4.1 10*3/uL (ref 1.7–7.7)
Neutrophils Relative %: 71 %
Platelets: 180 10*3/uL (ref 150–400)
RBC: 3.76 MIL/uL — ABNORMAL LOW (ref 3.87–5.11)
RDW: 12.8 % (ref 11.5–15.5)
WBC: 5.8 10*3/uL (ref 4.0–10.5)
nRBC: 0 % (ref 0.0–0.2)

## 2021-12-03 LAB — GLUCOSE, CAPILLARY
Glucose-Capillary: 48 mg/dL — ABNORMAL LOW (ref 70–99)
Glucose-Capillary: 121 mg/dL — ABNORMAL HIGH (ref 70–99)

## 2021-12-03 LAB — URINALYSIS, ROUTINE W REFLEX MICROSCOPIC
Bilirubin Urine: NEGATIVE
Glucose, UA: NEGATIVE mg/dL
Hgb urine dipstick: NEGATIVE
Ketones, ur: NEGATIVE mg/dL
Leukocytes,Ua: NEGATIVE
Nitrite: NEGATIVE
Protein, ur: 30 mg/dL — AB
Specific Gravity, Urine: 1.019 (ref 1.005–1.030)
pH: 5 (ref 5.0–8.0)

## 2021-12-03 LAB — OSMOLALITY: Osmolality: 391 mOsm/kg (ref 275–295)

## 2021-12-03 LAB — TSH: TSH: 42.419 u[IU]/mL — ABNORMAL HIGH (ref 0.350–4.500)

## 2021-12-03 LAB — ACETAMINOPHEN LEVEL: Acetaminophen (Tylenol), Serum: <10 ug/mL — ABNORMAL LOW (ref 10–30)

## 2021-12-03 LAB — TROPONIN I (HIGH SENSITIVITY): Troponin I (High Sensitivity): 4 ng/L (ref <18)

## 2021-12-03 LAB — LACTIC ACID, PLASMA: Lactic Acid, Venous: 0.9 mmol/L (ref 0.5–1.9)

## 2021-12-03 LAB — RESP PANEL BY RT-PCR (FLU A&B, COVID) ARPGX2
Influenza A by PCR: NEGATIVE
Influenza B by PCR: NEGATIVE
SARS Coronavirus 2 by RT PCR: NEGATIVE

## 2021-12-03 LAB — T4, FREE: Free T4: 1.03 ng/dL (ref 0.61–1.12)

## 2021-12-03 LAB — SALICYLATE LEVEL: Salicylate Lvl: <7 mg/dL — ABNORMAL LOW (ref 7.0–30.0)

## 2021-12-03 MED ORDER — ALBUTEROL SULFATE (2.5 MG/3ML) 0.083% IN NEBU: 2.5000 mg | INHALATION_SOLUTION | RESPIRATORY_TRACT | Status: DC | PRN

## 2021-12-03 MED ORDER — TRAZODONE HCL 50 MG PO TABS: 50.0000 mg | ORAL_TABLET | Freq: Every evening | ORAL | Status: DC | PRN

## 2021-12-03 MED ORDER — ACETAMINOPHEN 650 MG RE SUPP: 650.0000 mg | Freq: Four times a day (QID) | RECTAL | Status: DC | PRN

## 2021-12-03 MED ORDER — PANTOPRAZOLE SODIUM 40 MG PO TBEC: 40.0000 mg | DELAYED_RELEASE_TABLET | Freq: Two times a day (BID) | ORAL | Status: DC

## 2021-12-03 MED ORDER — BISACODYL 10 MG RE SUPP: 10.0000 mg | Freq: Every day | RECTAL | Status: DC | PRN

## 2021-12-03 MED ORDER — SODIUM CHLORIDE 0.9 % IV BOLUS
1000.0000 mL | Freq: Once | INTRAVENOUS | Status: AC
  Administered 2021-12-03: 1000 mL via INTRAVENOUS

## 2021-12-03 MED ORDER — DIPHENHYDRAMINE HCL 50 MG/ML IJ SOLN
50.0000 mg | Freq: Once | INTRAMUSCULAR | Status: AC
  Administered 2021-12-04: 50 mg via INTRAVENOUS
  Filled 2021-12-03: qty 1

## 2021-12-03 MED ORDER — ASCORBIC ACID 500 MG PO TABS
500.0000 mg | ORAL_TABLET | Freq: Every day | ORAL | Status: DC
  Filled 2021-12-03 (×3): qty 1

## 2021-12-03 MED ORDER — ROSUVASTATIN CALCIUM 20 MG PO TABS
20.0000 mg | ORAL_TABLET | Freq: Every day | ORAL | Status: DC
  Filled 2021-12-03 (×2): qty 1

## 2021-12-03 MED ORDER — FERROUS SULFATE 325 (65 FE) MG PO TABS
325.0000 mg | ORAL_TABLET | Freq: Every day | ORAL | Status: DC
  Filled 2021-12-03 (×2): qty 1

## 2021-12-03 MED ORDER — PREDNISONE 50 MG PO TABS: 50.0000 mg | ORAL_TABLET | Freq: Four times a day (QID) | ORAL | Status: DC

## 2021-12-03 MED ORDER — INSULIN ASPART 100 UNIT/ML IJ SOLN
0.0000 [IU] | Freq: Three times a day (TID) | INTRAMUSCULAR | Status: DC
  Administered 2021-12-04 (×2): 1 [IU] via SUBCUTANEOUS

## 2021-12-03 MED ORDER — ADULT MULTIVITAMIN W/MINERALS CH
1.0000 | ORAL_TABLET | Freq: Every day | ORAL | Status: DC
  Filled 2021-12-03 (×3): qty 1

## 2021-12-03 MED ORDER — DEXTROSE 5 % IV SOLN: INTRAVENOUS | Status: DC

## 2021-12-03 MED ORDER — SODIUM CHLORIDE 0.9% FLUSH
3.0000 mL | INTRAVENOUS | Status: DC | PRN
  Administered 2021-12-10 – 2021-12-11 (×2): 3 mL via INTRAVENOUS

## 2021-12-03 MED ORDER — HEPARIN SODIUM (PORCINE) 5000 UNIT/ML IJ SOLN
5000.0000 [IU] | Freq: Three times a day (TID) | INTRAMUSCULAR | Status: DC
  Administered 2021-12-03 – 2021-12-08 (×15): 5000 [IU] via SUBCUTANEOUS
  Filled 2021-12-03 (×15): qty 1

## 2021-12-03 MED ORDER — DEXTROSE 50 % IV SOLN
INTRAVENOUS | Status: AC
  Administered 2021-12-03: 50 mL
  Filled 2021-12-03: qty 50

## 2021-12-03 MED ORDER — VITAMIN B-12 1000 MCG PO TABS
1000.0000 ug | ORAL_TABLET | Freq: Every day | ORAL | Status: DC
  Filled 2021-12-03 (×3): qty 1

## 2021-12-03 MED ORDER — SODIUM CHLORIDE 0.9% FLUSH
3.0000 mL | Freq: Two times a day (BID) | INTRAVENOUS | Status: DC
  Administered 2021-12-04 – 2021-12-11 (×10): 3 mL via INTRAVENOUS

## 2021-12-03 MED ORDER — FOLIC ACID 1 MG PO TABS
1.0000 mg | ORAL_TABLET | Freq: Every day | ORAL | Status: DC
  Filled 2021-12-03 (×3): qty 1

## 2021-12-03 MED ORDER — SODIUM CHLORIDE 0.9 % IV SOLN
250.0000 mL | INTRAVENOUS | Status: DC | PRN
  Administered 2021-12-08: 250 mL via INTRAVENOUS

## 2021-12-03 MED ORDER — SODIUM CHLORIDE 0.9 % IV SOLN: INTRAVENOUS | Status: DC

## 2021-12-03 MED ORDER — ACETAMINOPHEN 325 MG PO TABS: 650.0000 mg | ORAL_TABLET | Freq: Four times a day (QID) | ORAL | Status: DC | PRN

## 2021-12-03 MED ORDER — TOPIRAMATE 25 MG PO TABS
50.0000 mg | ORAL_TABLET | Freq: Two times a day (BID) | ORAL | Status: DC
  Filled 2021-12-03 (×3): qty 2

## 2021-12-03 MED ORDER — VITAMIN B-12 500 MCG PO TABS: 1000.0000 ug | ORAL_TABLET | Freq: Every day | ORAL | Status: DC

## 2021-12-03 MED ORDER — THIAMINE HCL 100 MG PO TABS
100.0000 mg | ORAL_TABLET | Freq: Every day | ORAL | Status: DC
  Filled 2021-12-03 (×3): qty 1

## 2021-12-03 MED ORDER — DULOXETINE HCL 60 MG PO CPEP
60.0000 mg | ORAL_CAPSULE | Freq: Every day | ORAL | Status: DC
  Filled 2021-12-03 (×2): qty 1

## 2021-12-03 MED ORDER — ONDANSETRON HCL 4 MG PO TABS: 4.0000 mg | ORAL_TABLET | Freq: Four times a day (QID) | ORAL | Status: DC | PRN

## 2021-12-03 MED ORDER — DIPHENHYDRAMINE HCL 25 MG PO CAPS: 50.0000 mg | ORAL_CAPSULE | Freq: Three times a day (TID) | ORAL | Status: DC

## 2021-12-03 MED ORDER — METHYLPREDNISOLONE SODIUM SUCC 40 MG IJ SOLR
40.0000 mg | Freq: Four times a day (QID) | INTRAMUSCULAR | Status: AC
  Administered 2021-12-03 – 2021-12-04 (×3): 40 mg via INTRAVENOUS
  Filled 2021-12-03 (×3): qty 1

## 2021-12-03 MED ORDER — ASPIRIN 300 MG RE SUPP
300.0000 mg | Freq: Every day | RECTAL | Status: DC
  Administered 2021-12-03 – 2021-12-12 (×10): 300 mg via RECTAL
  Filled 2021-12-03 (×10): qty 1

## 2021-12-03 MED ORDER — POLYETHYLENE GLYCOL 3350 17 G PO PACK: 17.0000 g | PACK | Freq: Every day | ORAL | Status: DC | PRN

## 2021-12-03 MED ORDER — ZINC SULFATE 220 (50 ZN) MG PO CAPS
220.0000 mg | ORAL_CAPSULE | Freq: Every day | ORAL | Status: DC
  Filled 2021-12-03 (×3): qty 1

## 2021-12-03 MED ORDER — LEVOTHYROXINE SODIUM 100 MCG/5ML IV SOLN
100.0000 ug | Freq: Every day | INTRAVENOUS | Status: DC
  Administered 2021-12-03 – 2021-12-11 (×9): 100 ug via INTRAVENOUS
  Filled 2021-12-03 (×10): qty 5

## 2021-12-03 MED ORDER — ONDANSETRON HCL 4 MG/2ML IJ SOLN: 4.0000 mg | Freq: Four times a day (QID) | INTRAMUSCULAR | Status: DC | PRN

## 2021-12-03 MED ORDER — SODIUM CHLORIDE 0.9% FLUSH
3.0000 mL | Freq: Two times a day (BID) | INTRAVENOUS | Status: DC
  Administered 2021-12-04 – 2021-12-11 (×6): 3 mL via INTRAVENOUS

## 2021-12-03 MED ORDER — PANTOPRAZOLE SODIUM 40 MG IV SOLR
40.0000 mg | INTRAVENOUS | Status: DC
  Administered 2021-12-03 – 2021-12-07 (×5): 40 mg via INTRAVENOUS
  Filled 2021-12-03 (×7): qty 10

## 2021-12-03 MED ORDER — POTASSIUM CHLORIDE 10 MEQ/100ML IV SOLN
10.0000 meq | INTRAVENOUS | Status: AC
  Administered 2021-12-03 (×4): 10 meq via INTRAVENOUS
  Filled 2021-12-03 (×2): qty 100

## 2021-12-03 NOTE — ED Notes (Signed)
Patient transported to CT 

## 2021-12-03 NOTE — ED Notes (Signed)
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

## 2021-12-03 NOTE — ED Triage Notes (Addendum)
Patient coming from [facility] in [place] and caregiver states patient has not spoken a word since 2000 yesterday. Patient follows commands and has good strength in right side and left leg. Hx of hemiplagia

## 2021-12-03 NOTE — ED Provider Notes (Signed)
°  Face-to-face evaluation   History: Patient lives in a nursing care facility and presents for evaluation of altered mental status which apparently started yesterday.  She has history of dementia.  She recently saw her PCP for evaluation, on 11/16/2021.  Medical screening examination/treatment/procedure(s) were conducted as a shared visit with non-physician practitioner(s) and myself.  I personally evaluated the patient during the encounter admitted earlier this year for incidental anemia, requiring transfusion.  She had endoscopy with suspected but not proven GI bleeding source for anemia  Physical exam: Debilitated, under nourished elderly female who has her eyes open, but does seem to track and follow examiner.  It is unclear, but she appears to blink in response to request to close her eyes.  Her left arm has a marked flexion contracture at the elbow and appears deformed because of the weight is held.  She has significant spasticity of the left arm.  The right arm is held straight.  Patient does not answer any questions.  She is nonverbal.  MDM: Evaluation for  Chief Complaint  Patient presents with   Altered Mental Status     Patient presenting with altered mental status.  Her PCP documented "mild dementia," when he saw her on 11/16/2021.  Medical screening examination/treatment/procedure(s) were conducted as a shared visit with non-physician practitioner(s) and myself.  I personally evaluated the patient during the encounter    Daleen Bo, MD 12/04/21 2239

## 2021-12-03 NOTE — ED Provider Notes (Signed)
[facility] Provider Note   CSN: [id number] Arrival date & time: 12/03/21  1308     History  Chief Complaint  Patient presents with   Altered Mental Status    Joanna [name] is a 71 y.o. 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  She presents from her nursing home after the nursing staff noticed that she has been nonverbal since 8 PM yesterday evening.  Additionally she has had no p.o. intake today.  There has been no vomiting, no known diarrhea, no fevers.  No known injuries.  Level 5 caveat given patient presentation.  The history is provided by medical records and the nursing home.      Home Medications Prior to Admission medications   Medication Sig Start Date End Date Taking? Authorizing Provider  acetaminophen (TYLENOL) 500 MG tablet Take 1,000 mg by mouth every 8 (eight) hours as needed for moderate pain.    [provider]  albuterol (PROVENTIL HFA;VENTOLIN HFA) 108 (90 Base) MCG/ACT inhaler Inhale 2 puffs into the lungs every 4 (four) hours as needed for wheezing or shortness of breath. 12/25/18   [name], MD  ascorbic acid (VITAMIN C) 500 MG tablet Take 1 tablet (500 mg total) by mouth daily. 07/19/21   Antonieta Pert, MD  DULoxetine (CYMBALTA) 60 MG capsule Take 1 capsule (60 mg total) by mouth daily. 10/27/21   Marcial Pacas, MD  ferrous sulfate 325 (65 FE) MG tablet Take 325 mg by mouth daily with breakfast.    [provider]  fexofenadine (ALLEGRA) 180 MG tablet Take 180 mg by mouth daily as needed for allergies.    [provider]  folic acid (FOLVITE) 1 MG tablet TAKE (1) TABLET BY MOUTH ONCE DAILY. 11/14/21   [name], MD  levothyroxine (SYNTHROID) 150 MCG tablet Take 1 tablet (150 mcg total) by mouth daily. 11/17/21   [name], MD   Magnesium 250 MG TABS Take 1 tablet (250 mg total) by mouth 2 (two) times daily. Patient not taking: Reported on 11/01/2021 06/28/21   Barb Merino, MD  Multiple Vitamin (MULTIVITAMIN WITH MINERALS) TABS tablet Take 1 tablet by mouth daily. 07/26/21   Elodia Florence., MD  Nutritional Supplements (ENSURE PLUS HIGH PROTEIN) LIQD Take 237 mLs by mouth 3 (three) times daily. Patient not taking: Reported on 11/01/2021 07/25/21   Elodia Florence., MD  pantoprazole (PROTONIX) 40 MG tablet Take 1 tablet (40 mg total) by mouth 2 (two) times daily. 11/02/21 11/02/22  Johnson, Clanford L, MD  rosuvastatin (CRESTOR) 20 MG tablet Take 1 tablet by mouth daily (Stop Pravastatin) Patient taking differently: Take 20 mg by mouth daily. 01/19/21   [name], MD  thiamine 100 MG tablet Take 1 tablet (100 mg total) by mouth daily. Patient not taking: Reported on 11/01/2021 07/19/21   Antonieta Pert, MD  topiramate (TOPAMAX) 50 MG tablet Take 1 tablet (50 mg total) by mouth 2 (two) times daily. 04/29/21   [name], MD  traZODone (DESYREL) 50 MG tablet TAKE 1/2 TO 1 TABLET BY MOUTH EVERY NIGHT FOR SLEEP Patient not taking: Reported on 11/01/2021 01/19/21   [name], MD  vitamin B-12 (CYANOCOBALAMIN) 1000 MCG tablet TAKE (1) TABLET BY MOUTH ONCE DAILY. 11/14/21   [name], MD  vitamin B-12 (CYANOCOBALAMIN)  500 MCG tablet Take 1,000 mcg by mouth daily.    [provider]  zinc sulfate 220 (50 Zn) MG capsule Take 1 capsule (220 mg total) by mouth daily. 07/19/21   Antonieta Pert, MD      Allergies    Zoloft [sertraline hcl] and Ivp dye [iodinated contrast media]    Review of Systems   Review of Systems  Unable to perform ROS: Patient nonverbal   Physical Exam Updated Vital Signs BP (!) 126/92    Pulse 93    Temp 97.6 F (36.4 C) (Rectal)    Resp 17    Ht 5\' 2"  (1.575 m)    Wt 48 kg    SpO2 100%    BMI 19.35 kg/m  Physical Exam Vitals and nursing note reviewed.  Constitutional:       Appearance: She is well-developed.     Comments: Patient is alert and gives eye contact when directly spoken to, but does not respond to questions or directions.  HENT:     Head: Normocephalic and atraumatic.     Mouth/Throat:     Mouth: Mucous membranes are dry.  Eyes:     Conjunctiva/sclera: Conjunctivae normal.  Cardiovascular:     Rate and Rhythm: Regular rhythm. Tachycardia present.     Pulses: Normal pulses.     Heart sounds: Normal heart sounds.  Pulmonary:     Effort: Pulmonary effort is normal.     Breath sounds: Normal breath sounds. No wheezing.  Abdominal:     General: Bowel sounds are normal.     Palpations: Abdomen is soft.     Tenderness: There is no abdominal tenderness. There is no guarding.  Musculoskeletal:     Cervical back: Normal range of motion.     Comments: Left upper and lower spastic paresis  Skin:    General: Skin is warm and dry.  Neurological:     Mental Status: She is alert.    ED Results / Procedures / Treatments   Labs (all labs ordered are listed, but only abnormal results are displayed) Labs Reviewed  COMPREHENSIVE METABOLIC PANEL - Abnormal; Notable for the following components:      Result Value   Potassium 3.4 (*)    CO2 20 (*)    Glucose, Bld 130 (*)    Albumin 3.2 (*)    All other components within normal limits  CBC WITH DIFFERENTIAL/PLATELET - Abnormal; Notable for the following components:   RBC 3.76 (*)    All other components within normal limits  URINALYSIS, ROUTINE W REFLEX MICROSCOPIC - Abnormal; Notable for the following components:   Protein, ur 30 (*)    Bacteria, UA RARE (*)    All other components within normal limits  TSH - Abnormal; Notable for the following components:   TSH 42.419 (*)    All other components within normal limits  SALICYLATE LEVEL - Abnormal; Notable for the following components:   Salicylate Lvl <7.0 (*)    All other components within normal limits  ACETAMINOPHEN LEVEL - Abnormal; Notable  for the following components:   Acetaminophen (Tylenol), Serum <10 (*)    All other components within normal limits  BLOOD GAS, VENOUS - Abnormal; Notable for the following components:   pO2, Ven <31 (*)    Bicarbonate 29.1 (*)    Acid-Base Excess 3.3 (*)    All other components within normal limits  LACTIC ACID, PLASMA  T4, FREE  OSMOLALITY  CBG MONITORING, ED  TROPONIN I (  HIGH SENSITIVITY)    EKG EKG Interpretation  Date/Time:  Saturday December 03 2021 16:09:04 EST Ventricular Rate:  87 PR Interval:  152 QRS Duration: 90 QT Interval:  358 QTC Calculation: 431 R Axis:   -24 Text Interpretation: Sinus rhythm Borderline left axis deviation Borderline low voltage, extremity leads Abnormal R-wave progression, early transition Minimal ST elevation, inferior leads Since last tracing of earlier today No significant change was found Confirmed by Daleen Bo [phone]) on 12/03/2021 4:38:12 PM  Radiology DG Chest 1 View  Result Date: 12/03/2021 CLINICAL DATA:  Altered mental status EXAM: CHEST  1 VIEW COMPARISON:  07/09/2021 FINDINGS: The heart size and mediastinal contours are within normal limits. Both lungs are clear. The visualized skeletal structures are unremarkable. IMPRESSION: No acute abnormality of the lungs in AP portable projection. Electronically Signed   By: Delanna Ahmadi M.D.   On: 12/03/2021 14:05   CT HEAD WO CONTRAST  Result Date: 12/03/2021 CLINICAL DATA:  Mental status changes. EXAM: CT HEAD WITHOUT CONTRAST TECHNIQUE: Contiguous axial images were obtained from the base of the skull through the vertex without intravenous contrast. RADIATION DOSE REDUCTION: This exam was performed according to the departmental dose-optimization program which includes automated exposure control, adjustment of the mA and/or kV according to patient size and/or use of iterative reconstruction technique. COMPARISON:  07/07/2021 FINDINGS: Brain: There is no evidence for acute hemorrhage,  hydrocephalus, mass lesion, or abnormal extra-axial fluid collection. No definite CT evidence for acute infarction. Patchy low attenuation in the deep hemispheric and periventricular white matter is nonspecific, but likely reflects chronic microvascular ischemic demyelination. Vascular: No hyperdense vessel or unexpected calcification. Skull: No evidence for fracture. No worrisome lytic or sclerotic lesion. Sinuses/Orbits: The visualized paranasal sinuses and mastoid air cells are clear. Visualized portions of the globes and intraorbital fat are unremarkable. Other: None. IMPRESSION: 1. No acute intracranial abnormality. 2. Chronic small vessel ischemic disease. Electronically Signed   By: Misty Stanley M.D.   On: 12/03/2021 14:03    Procedures Procedures    Medications Ordered in ED Medications  sodium chloride 0.9 % bolus 1,000 mL (0 mLs Intravenous Stopped 12/03/21 1623)    And  0.9 %  sodium chloride infusion ( Intravenous New Bag/Given 12/03/21 1627)  predniSONE (DELTASONE) tablet 50 mg (has no administration in time range)  diphenhydrAMINE (BENADRYL) capsule 50 mg (has no administration in time range)    ED Course/ Medical Decision Making/ A&P                           Medical Decision Making Pt with altered mental status since 8 pm last night, nonverbal per nursing staff at her care facility including her nephew Carin Primrose, who is also the home owner.  He reports she has refused to eat today as well.  Unclear etiology.  Chronic left sided spasticity 2ndary to cerebral palsy. No other new obvious neuro deficits.  Amount and/or Complexity of Data Reviewed Independent Historian: friend    Details: former neighbor and friend Evelena Peat now at bedside reporting she last visited with pt about 2 weeks ago.  Baseline conversive, no confusion.  States had similar episode last summer triggered by UTI prompting initial stay in rehab, however been in rehab since that event.  she states that the  patient does not seem to recognize her today, but did 2 weeks ago at her last visit with her. Labs: ordered.    Details: Labs without obvios source of  AMS.  PO2 low but venous specimen.  UA negative for infection.  Lactate normal - this is not sepsis. Radiology: ordered.    Details: CT head negative for hemorrhagic stroke.  Cxr neg for infection. ECG/medicine tests: ordered.    Details: stable Discussion of management or test interpretation with external provider(s): Discussed care with Dr. Denton Brick who accepts pt for admission.  Of note,  pt's daughter, [name] has started to complete requirements to become healthcare POA, but not sure if this has been completed, per conversation with nephew Carin Primrose.  She lives in [place]. He would also like to be advised of her care and findings.  Risk Decision regarding hospitalization.           Final Clinical Impression(s) / ED Diagnoses Final diagnoses:  Encephalopathy acute    Rx / DC Orders ED Discharge Orders     None         Landis Martins 12/03/21 1728    Wynona Dove A, DO 12/04/21 1100

## 2021-12-03 NOTE — Progress Notes (Signed)
Date and time results received: 12/03/21 9:18 PM  (use smartphrase ".now" to insert current time)  Test: osmolality Critical Value: 391  Name of Provider Notified: Dr. Carlynn Purl  Orders Received? Or Actions Taken?: awaiting orders

## 2021-12-03 NOTE — Progress Notes (Signed)
Hypoglycemic Event  CBG: 48  Treatment: D5W at 75 ml/her ordered by doctor  Symptoms: None  Follow-up CBG: Time:2316 CBG Result0:121  Possible Reasons for Event: Inadequate meal intake  Comments/MD notified:notified Dr. Carlynn Purl    Santa Lighter

## 2021-12-03 NOTE — H&P (Signed)
Patient Demographics:    Joanna [name], is a 71 y.o. female  MRN: [id number]   DOB - [birth year]  Admit Date - 12/03/2021  Outpatient Primary MD for the patient is [name], MD   Assessment & Plan:   Assessment and Plan:  1)Acute Metabolic Encephalopathy --Etiology of patient's change in mentation is unclear at this time-again patient was last known well at her baseline around 8 PM on 12/02/2021 -No evidence of acute infection specifically UA and chest x-ray not concerning for acute infection, no fevers or leukocytosis, no lactic acidosis -COVID and flu negative -Blood cultures pending -No reported falls or injury -Acetaminophen and  salicylate are Not elevated -CT head without contrast without acute finding -CTA head and neck to be done after Solu-Medrol and Benadryl prep for contrast allergy -Please consider EEG if CTA head and neck are unremarkable --Patient was noted to be moving the right side spontaneously, he does have chronic left-sided weakness with left upper extremity spastic changes -No significant metabolic derangement to explain patient's altered mentation -Empiric treatment with rectal aspirin at this time -Crestor when able to take arriving to -Physical therapy and speech therapy eval requested -Restart Topamax when able to take oral intake  2)Severe Hypothyroidism--- this is not new, -TSH is 42.4,  it was 45 a couple of weeks ago and 59 about 3 months ago -This would not explain current events -Patient unable to take oral intake reliably we will start IV levothyroxine pending tolerance of oral intake  3)DM2- A1c was 5.1 in September 2022 reflecting excellent diabetic control PTA -Glucose is 133 Use Novolog/Humalog Sliding scale insulin with Accu-Cheks/Fingersticks as ordered   4)Chronic  Neuromuscular deficits--- at baseline patient has significant muscle weakness and  left-sided spastic contractures -- PT eval requested  5)History of prior GI bleed--H/o small bowel AVMs/GI bleeds requiring transfusion in the past -Give IV Protonix for GI prophylaxis while on high-dose steroids   6)Sacral decubitus--does not appear infected, please see photos in epic, wound care consult requested  Disposition/Need for in-Hospital Stay- patient unable to be discharged at this time due to --altered mentation/acute encephalopathy requiring further work-up to eliminate possibility of possible stroke, seizures or infection*  Dispo: The patient is from: Group home-- [facility]              Anticipated d/c is to: Group home              Anticipated d/c date is: 1 day              Patient currently is not medically stable to d/c. 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 [facility]   Bilateral headaches    Depression    Diabetes mellitus    Diverticulosis    Dyslipidemia    GERD (gastroesophageal reflux disease)  Hypertension    Insomnia    Microproteinuria    Renal insufficiency 2010   Right carpal tunnel syndrome 12/24/2017   Sleep apnea    Sleep apnea 12/25/2017   Abnormal sleep study February 2019 CPAP ordered   Thyroid disease    hypothyroid      Past Surgical History:  Procedure Laterality Date   ABDOMINAL HYSTERECTOMY  1984   BACK SURGERY     BILATERAL OOPHORECTOMY     BIOPSY  02/19/2020   Procedure: BIOPSY;  Surgeon: [name], MD;  [facility];  Service: Endoscopy;;  gastric   BIOPSY  11/01/2021   Procedure: BIOPSY;  Surgeon: Harvel Quale, MD;  Location: [facility];  Service: Gastroenterology;;   CERVICAL [place] SURGERY  01/16/2019   COLONOSCOPY  01/29/2012   Fields-2-3 hyperplastic polyps, mod internal  hemorrhoids, diverticulosis throughout colon   COLONOSCOPY N/A 07/03/2014   Dr. Oneida Alar: Hyperplastic polyps   COLONOSCOPY N/A 06/06/2019   Dr. Oneida Alar: Diverticulosis, hemorrhoids, single polyp removed, pathology revealed lipoma.  Next colonoscopy 10 years   ENTEROSCOPY N/A 11/01/2021   Procedure: ENTEROSCOPY;  Surgeon: Harvel Quale, MD;  [facility];  Service: Gastroenterology;  Laterality: N/A;   ESOPHAGOGASTRODUODENOSCOPY  01/29/2012   Fields-2-3cm sliding HH, fundic gland polyps, NEGATIVE H pylori   ESOPHAGOGASTRODUODENOSCOPY N/A 07/03/2014   Dr. Oneida Alar: gastritis and normal small bowel biopsies   ESOPHAGOGASTRODUODENOSCOPY N/A 02/19/2020   Fields: hypertrophic folds in the stomach. benign polyp removed   ESOPHAGOGASTRODUODENOSCOPY (EGD) WITH PROPOFOL N/A 11/01/2021   Procedure: ESOPHAGOGASTRODUODENOSCOPY (EGD) WITH PROPOFOL;  Surgeon: Harvel Quale, MD;  [facility];  Service: Gastroenterology;  Laterality: N/A;   [facility] CAPSULE STUDY N/A 10/26/2014   SMALL BOWEL AVMs   KNEE ARTHROSCOPY Right    POLYPECTOMY  06/06/2019   Procedure: POLYPECTOMY;  Surgeon: [name], MD;  [facility];  Service: Endoscopy;;   SMALL BOWEL ENTEROSCOPY  11/01/2021   normal esophagus, non bleeding gastric ulcers with clean ulcer base (forres class III) some associated erosions in antrum, nodular mucosa in gastric fundus and gastric body, normal duodenum   TOTAL ABDOMINAL HYSTERECTOMY W/ BILATERAL SALPINGOOPHORECTOMY     Chief Complaint  Patient presents with   Altered Mental Status      HPI:    Joanna [name]  is a 71 y.o. female of stroke with left-sided spastic contractures, small bowel AVMs/GI bleeds requiring transfusion,, severe hypothyroidism, diabetes mellitus type 2, hypertension, , chronic urinary incontinence who is brought in from [facility] with concerns of being noncommunicative since 8 PM on 12/02/2021 -As per staff  patient has not spoken avoid since 12/02/2021 around 8 PM -Here in the ED patient remains nonverbal/noncommunicative, does not follow instructions or commands -Patient is awake and staring -No reported fevers no falls or injuries -Rest of history is very limited due to significantly altered mentation -Patient was noted to be moving the right side spontaneously, he does have chronic left-sided weakness with left upper extremity spastic changes -UA is not indicative of an infection at this time -Chest x-ray without acute cardiopulmonary finding  -CT head without contrast without acute finding -CTA head and neck to be done after Solu-Medrol and Benadryl prep for contrast allergy -COVID and flu negative -Acetaminophen and salicylate levels are not elevated -LFTs are not elevated -Glucose is 130 potassium is 3.4 sodium is 136 bicarb is 20 with a chloride of 108 and a creatinine of 0.69 -Lactic acid is  0.9 -TSH is 42.4 it was 45 a couple of weeks ago and 59 about 3 months ago -Etiology of patient's change in mentation is unclear at this time-again patient was last known well at her baseline around 8 PM on 12/02/2021   Review of systems:    In addition to the HPI above,   A full Review of  Systems was done, all other systems reviewed are negative except as noted above in HPI , .    Social History:  Reviewed by me    Social History   Tobacco Use   Smoking status: Every Day    Packs/day: 0.25    Years: 30.00    Pack years: 7.50    Types: Cigarettes    Last attempt to quit: 10/16/2010    Years since quitting: 11.1   Smokeless tobacco: Former    Quit date: 10/28/2010  Substance Use Topics   Alcohol use: Yes    Comment: socially, maybe 3 times a year     Family History :  Reviewed by me    Family History  Problem Relation Age of Onset   Cardiomyopathy Father    Heart failure Mother    Hyperlipidemia Mother    Stroke Brother    Kidney cancer Brother    Diabetes Paternal Uncle     Heart disease Paternal Grandmother    Colon cancer Neg Hx    Colon polyps Neg Hx      Home Medications:   Prior to Admission medications   Medication Sig Start Date End Date Taking? Authorizing Provider  acetaminophen (TYLENOL) 500 MG tablet Take 1,000 mg by mouth every 8 (eight) hours as needed for moderate pain.    [provider]  albuterol (PROVENTIL HFA;VENTOLIN HFA) 108 (90 Base) MCG/ACT inhaler Inhale 2 puffs into the lungs every 4 (four) hours as needed for wheezing or shortness of breath. 12/25/18   [name], MD  ascorbic acid (VITAMIN C) 500 MG tablet Take 1 tablet (500 mg total) by mouth daily. 07/19/21   Antonieta Pert, MD  DULoxetine (CYMBALTA) 60 MG capsule Take 1 capsule (60 mg total) by mouth daily. 10/27/21   Marcial Pacas, MD  ferrous sulfate 325 (65 FE) MG tablet Take 325 mg by mouth daily with breakfast.    [provider]  fexofenadine (ALLEGRA) 180 MG tablet Take 180 mg by mouth daily as needed for allergies.    [provider]  folic acid (FOLVITE) 1 MG tablet TAKE (1) TABLET BY MOUTH ONCE DAILY. 11/14/21   [name], MD  levothyroxine (SYNTHROID) 150 MCG tablet Take 1 tablet (150 mcg total) by mouth daily. 11/17/21   [name], MD  Magnesium 250 MG TABS Take 1 tablet (250 mg total) by mouth 2 (two) times daily. Patient not taking: Reported on 11/01/2021 06/28/21   Barb Merino, MD  Multiple Vitamin (MULTIVITAMIN WITH MINERALS) TABS tablet Take 1 tablet by mouth daily. 07/26/21   Elodia Florence., MD  Nutritional Supplements (ENSURE PLUS HIGH PROTEIN) LIQD Take 237 mLs by mouth 3 (three) times daily. Patient not taking: Reported on 11/01/2021 07/25/21   Elodia Florence., MD  pantoprazole (PROTONIX) 40 MG tablet Take 1 tablet (40 mg total) by mouth 2 (two) times daily. 11/02/21 11/02/22  Johnson, Clanford L, MD  rosuvastatin (CRESTOR) 20 MG tablet Take 1 tablet by mouth daily (Stop Pravastatin) Patient taking differently: Take  20 mg by mouth daily. 01/19/21   [name], MD  thiamine 100 MG tablet Take 1 tablet (100 mg total) by mouth daily. Patient not taking: Reported on 11/01/2021 07/19/21   Antonieta Pert, MD  topiramate (TOPAMAX) 50 MG tablet Take 1 tablet (50 mg total) by mouth 2 (two) times daily. 04/29/21   [name], MD  traZODone (DESYREL) 50 MG tablet TAKE 1/2 TO 1 TABLET BY MOUTH EVERY NIGHT FOR SLEEP Patient not taking: Reported on 11/01/2021 01/19/21   [name], MD  vitamin B-12 (CYANOCOBALAMIN) 1000 MCG tablet TAKE (1) TABLET BY MOUTH ONCE DAILY. 11/14/21   [name], MD  vitamin B-12 (CYANOCOBALAMIN) 500 MCG tablet Take 1,000 mcg by mouth daily.    [provider]  zinc sulfate 220 (50 Zn) MG capsule Take 1 capsule (220 mg total) by mouth daily. 07/19/21   Antonieta Pert, MD     Allergies:     Allergies  Allergen Reactions   Zoloft [Sertraline Hcl] Other (See Comments)    Made the patient feel withdrawn and had bad dreams- "Allergic," per [facility]   Ivp Dye [Iodinated Contrast Media] Rash and Other (See Comments)    "Allergic," per [facility]     Physical Exam:   Vitals  Blood pressure (!) 135/95, pulse 95, temperature 97.6 F (36.4 C), temperature source Rectal, resp. rate 18, height 5\' 2"  (1.575 m), weight 48 kg, SpO2 100 %.  Physical Examination: General appearance -awake but nonverbal Mental status -awake, noncommunicative, not following commands, nonverbal Eyes - sclera anicteric Neck - supple, no JVD elevation , Chest - clear  to auscultation bilaterally, symmetrical air movement,  Heart - S1 and S2 normal, regular  Abdomen - soft, nontender, nondistended, +BS   neurological -Limited neuro exam due to patient being nonverbal/noncommunicative at this time, chronic left upper extremity spastic hemiplegia noted,  -extremities -left upper extremity with spastic hemiplegia, no pedal edema noted, intact peripheral pulses  Skin - warm, dry, sacral decubitus noted please see photos in  epic -  [place] to center Decubitus   12/03/2021 17:45  Attached To:  Hospital Encounter on 12/03/21   Source Information  Roxan Hockey, MD   [facility]     Data Review:    CBC Recent Labs  Lab 12/03/21 1353  WBC 5.8  HGB 12.6  HCT 36.3  PLT 180  MCV 96.5  MCH 33.5  MCHC 34.7  RDW 12.8  LYMPHSABS 1.4  MONOABS 0.4  EOSABS 0.0  BASOSABS 0.0   Chemistries  Recent Labs  Lab 12/03/21 1353  NA 136  K 3.4*  CL 108  CO2 20*  GLUCOSE 130*  BUN 11  CREATININE 0.69  CALCIUM 9.8  AST 21  ALT 17  ALKPHOS 72  BILITOT 0.7   ------------------------------------------------------------------------------------------------------------------ estimated creatinine clearance is 50.3 mL/min (by C-G formula based on SCr of 0.69 mg/dL). ------------------------------------------------------------------------------------------------------------------ Recent Labs    12/03/21 1439  TSH 42.419*     Coagulation profile No results for input(s): INR, PROTIME in the last 168 hours. ------------------------------------------------------------------------------------------------------------------- No results for input(s): DDIMER in the last 72 hours. -------------------------------------------------------------------------------------------------------------------  Cardiac Enzymes No results for input(s): CKMB, TROPONINI, MYOGLOBIN in the last 168 hours.  Invalid input(s): CK ------------------------------------------------------------------------------------------------------------------ No results found for: BNP  Urinalysis    Component Value Date/Time   COLORURINE YELLOW 12/03/2021 [place] 12/03/2021 1337   LABSPEC 1.019 12/03/2021 1337   PHURINE 5.0 12/03/2021 1337   GLUCOSEU NEGATIVE 12/03/2021 1337   HGBUR NEGATIVE 12/03/2021 1337  BILIRUBINUR NEGATIVE 12/03/2021 [place]  12/03/2021 1337   PROTEINUR 30 (A) 12/03/2021 1337   NITRITE NEGATIVE 12/03/2021 1337   [place] 12/03/2021 1337    ----------------------------------------------------------------------------------------------------------------   Imaging Results:    DG Chest 1 View  Result Date: 12/03/2021 CLINICAL DATA:  Altered mental status EXAM: CHEST  1 VIEW COMPARISON:  07/09/2021 FINDINGS: The heart size and mediastinal contours are within normal limits. Both lungs are clear. The visualized skeletal structures are unremarkable. IMPRESSION: No acute abnormality of the lungs in [facility] portable projection. Electronically Signed   By: Delanna Ahmadi M.D.   On: 12/03/2021 14:05   CT HEAD WO CONTRAST  Result Date: 12/03/2021 CLINICAL DATA:  Mental status changes. EXAM: CT HEAD WITHOUT CONTRAST TECHNIQUE: Contiguous axial images were obtained from the base of the skull through the vertex without intravenous contrast. RADIATION DOSE REDUCTION: This exam was performed according to the departmental dose-optimization program which includes automated exposure control, adjustment of the mA and/or kV according to patient size and/or use of iterative reconstruction technique. COMPARISON:  07/07/2021 FINDINGS: Brain: There is no evidence for acute hemorrhage, hydrocephalus, mass lesion, or abnormal extra-axial fluid collection. No definite CT evidence for acute infarction. Patchy low attenuation in the deep hemispheric and periventricular white matter is nonspecific, but likely reflects chronic microvascular ischemic demyelination. Vascular: No hyperdense vessel or unexpected calcification. Skull: No evidence for fracture. No worrisome lytic or sclerotic lesion. Sinuses/Orbits: The visualized paranasal sinuses and mastoid air cells are clear. Visualized portions of the globes and intraorbital fat are unremarkable. Other: None. IMPRESSION: 1. No acute intracranial abnormality. 2. Chronic small vessel ischemic disease.  Electronically Signed   By: Misty Stanley M.D.   On: 12/03/2021 14:03    Radiological Exams on Admission: DG Chest 1 View  Result Date: 12/03/2021 CLINICAL DATA:  Altered mental status EXAM: CHEST  1 VIEW COMPARISON:  07/09/2021 FINDINGS: The heart size and mediastinal contours are within normal limits. Both lungs are clear. The visualized skeletal structures are unremarkable. IMPRESSION: No acute abnormality of the lungs in [facility] portable projection. Electronically Signed   By: Delanna Ahmadi M.D.   On: 12/03/2021 14:05   CT HEAD WO CONTRAST  Result Date: 12/03/2021 CLINICAL DATA:  Mental status changes. EXAM: CT HEAD WITHOUT CONTRAST TECHNIQUE: Contiguous axial images were obtained from the base of the skull through the vertex without intravenous contrast. RADIATION DOSE REDUCTION: This exam was performed according to the departmental dose-optimization program which includes automated exposure control, adjustment of the mA and/or kV according to patient size and/or use of iterative reconstruction technique. COMPARISON:  07/07/2021 FINDINGS: Brain: There is no evidence for acute hemorrhage, hydrocephalus, mass lesion, or abnormal extra-axial fluid collection. No definite CT evidence for acute infarction. Patchy low attenuation in the deep hemispheric and periventricular white matter is nonspecific, but likely reflects chronic microvascular ischemic demyelination. Vascular: No hyperdense vessel or unexpected calcification. Skull: No evidence for fracture. No worrisome lytic or sclerotic lesion. Sinuses/Orbits: The visualized paranasal sinuses and mastoid air cells are clear. Visualized portions of the globes and intraorbital fat are unremarkable. Other: None. IMPRESSION: 1. No acute intracranial abnormality. 2. Chronic small vessel ischemic disease. Electronically Signed   By: Misty Stanley M.D.   On: 12/03/2021 14:03    DVT Prophylaxis -SCD/Heparin AM Labs Ordered, also please review Full Orders  Family  Communication: Admission, patients condition and plan of care including tests being ordered have been discussed with the patient and cousin at bedside who indicate  understanding and agree with the plan   Code Status - Full Code  Likely DC to back to her [facility]ILY group home  Condition   stable  Roxan Hockey M.D on 12/03/2021 at 6:40 PM Go to [email or web address] -  for contact info  [facility] - Office  [phone]

## 2021-12-04 ENCOUNTER — Observation Stay (HOSPITAL_COMMUNITY): Payer: PPO

## 2021-12-04 DIAGNOSIS — G450 Vertebro-basilar artery syndrome: Secondary | ICD-10-CM | POA: Diagnosis not present

## 2021-12-04 DIAGNOSIS — E876 Hypokalemia: Secondary | ICD-10-CM | POA: Diagnosis present

## 2021-12-04 DIAGNOSIS — F03A3 Unspecified dementia, mild, with mood disturbance: Secondary | ICD-10-CM | POA: Diagnosis present

## 2021-12-04 DIAGNOSIS — L89616 Pressure-induced deep tissue damage of right heel: Secondary | ICD-10-CM | POA: Diagnosis present

## 2021-12-04 DIAGNOSIS — Z20822 Contact with and (suspected) exposure to covid-19: Secondary | ICD-10-CM | POA: Diagnosis present

## 2021-12-04 DIAGNOSIS — G9340 Encephalopathy, unspecified: Secondary | ICD-10-CM | POA: Diagnosis present

## 2021-12-04 DIAGNOSIS — Z7189 Other specified counseling: Secondary | ICD-10-CM | POA: Diagnosis not present

## 2021-12-04 DIAGNOSIS — I10 Essential (primary) hypertension: Secondary | ICD-10-CM | POA: Diagnosis not present

## 2021-12-04 DIAGNOSIS — E11649 Type 2 diabetes mellitus with hypoglycemia without coma: Secondary | ICD-10-CM | POA: Diagnosis not present

## 2021-12-04 DIAGNOSIS — Z7401 Bed confinement status: Secondary | ICD-10-CM | POA: Diagnosis not present

## 2021-12-04 DIAGNOSIS — E1122 Type 2 diabetes mellitus with diabetic chronic kidney disease: Secondary | ICD-10-CM | POA: Diagnosis present

## 2021-12-04 DIAGNOSIS — N1832 Chronic kidney disease, stage 3b: Secondary | ICD-10-CM | POA: Diagnosis not present

## 2021-12-04 DIAGNOSIS — G8114 Spastic hemiplegia affecting left nondominant side: Secondary | ICD-10-CM | POA: Diagnosis not present

## 2021-12-04 DIAGNOSIS — R29818 Other symptoms and signs involving the nervous system: Secondary | ICD-10-CM | POA: Diagnosis not present

## 2021-12-04 DIAGNOSIS — I672 Cerebral atherosclerosis: Secondary | ICD-10-CM | POA: Diagnosis not present

## 2021-12-04 DIAGNOSIS — N179 Acute kidney failure, unspecified: Secondary | ICD-10-CM | POA: Diagnosis present

## 2021-12-04 DIAGNOSIS — L89153 Pressure ulcer of sacral region, stage 3: Secondary | ICD-10-CM | POA: Diagnosis present

## 2021-12-04 DIAGNOSIS — E8809 Other disorders of plasma-protein metabolism, not elsewhere classified: Secondary | ICD-10-CM | POA: Diagnosis present

## 2021-12-04 DIAGNOSIS — E860 Dehydration: Principal | ICD-10-CM

## 2021-12-04 DIAGNOSIS — R4182 Altered mental status, unspecified: Secondary | ICD-10-CM | POA: Diagnosis not present

## 2021-12-04 DIAGNOSIS — E870 Hyperosmolality and hypernatremia: Secondary | ICD-10-CM

## 2021-12-04 DIAGNOSIS — E038 Other specified hypothyroidism: Secondary | ICD-10-CM | POA: Diagnosis not present

## 2021-12-04 DIAGNOSIS — D631 Anemia in chronic kidney disease: Secondary | ICD-10-CM | POA: Diagnosis present

## 2021-12-04 DIAGNOSIS — J690 Pneumonitis due to inhalation of food and vomit: Secondary | ICD-10-CM | POA: Diagnosis present

## 2021-12-04 DIAGNOSIS — Z66 Do not resuscitate: Secondary | ICD-10-CM | POA: Diagnosis present

## 2021-12-04 DIAGNOSIS — I69354 Hemiplegia and hemiparesis following cerebral infarction affecting left non-dominant side: Secondary | ICD-10-CM | POA: Diagnosis not present

## 2021-12-04 DIAGNOSIS — L8990 Pressure ulcer of unspecified site, unspecified stage: Secondary | ICD-10-CM | POA: Diagnosis present

## 2021-12-04 DIAGNOSIS — L89312 Pressure ulcer of right buttock, stage 2: Secondary | ICD-10-CM | POA: Diagnosis present

## 2021-12-04 DIAGNOSIS — E46 Unspecified protein-calorie malnutrition: Secondary | ICD-10-CM | POA: Diagnosis present

## 2021-12-04 DIAGNOSIS — G9341 Metabolic encephalopathy: Secondary | ICD-10-CM | POA: Diagnosis present

## 2021-12-04 DIAGNOSIS — Z515 Encounter for palliative care: Secondary | ICD-10-CM | POA: Diagnosis not present

## 2021-12-04 DIAGNOSIS — E039 Hypothyroidism, unspecified: Secondary | ICD-10-CM | POA: Diagnosis present

## 2021-12-04 DIAGNOSIS — I129 Hypertensive chronic kidney disease with stage 1 through stage 4 chronic kidney disease, or unspecified chronic kidney disease: Secondary | ICD-10-CM | POA: Diagnosis present

## 2021-12-04 LAB — BLOOD GAS, VENOUS
Acid-Base Excess: 3.3 mmol/L — ABNORMAL HIGH (ref 0.0–2.0)
Acid-base deficit: 0.3 mmol/L (ref 0.0–2.0)
Bicarbonate: 29.1 mmol/L — ABNORMAL HIGH (ref 20.0–28.0)
Bicarbonate: 22.3 mmol/L (ref 20.0–28.0)
Drawn by: 1517
FIO2: 21 %
FIO2: 32 %
O2 Saturation: 32.9 %
O2 Saturation: 98.5 %
Patient temperature: 36.2
Patient temperature: 36.9
pCO2, Ven: 46 mmHg (ref 44–60)
pCO2, Ven: 30 mmHg — ABNORMAL LOW (ref 44–60)
pH, Ven: 7.4 (ref 7.25–7.43)
pH, Ven: 7.48 — ABNORMAL HIGH (ref 7.25–7.43)
pO2, Ven: <31 mmHg — CL (ref 32–45)
pO2, Ven: 114 mmHg — ABNORMAL HIGH (ref 32–45)

## 2021-12-04 LAB — COMPREHENSIVE METABOLIC PANEL
ALT: 17 U/L (ref 0–44)
AST: 18 U/L (ref 15–41)
Albumin: 2.7 g/dL — ABNORMAL LOW (ref 3.5–5.0)
Alkaline Phosphatase: 74 U/L (ref 38–126)
Anion gap: 13 (ref 5–15)
BUN: 108 mg/dL — ABNORMAL HIGH (ref 8–23)
CO2: 19 mmol/L — ABNORMAL LOW (ref 22–32)
Calcium: 10.4 mg/dL — ABNORMAL HIGH (ref 8.9–10.3)
Chloride: 129 mmol/L — ABNORMAL HIGH (ref 98–111)
Creatinine, Ser: 2.47 mg/dL — ABNORMAL HIGH (ref 0.44–1.00)
GFR, Estimated: 21 mL/min — ABNORMAL LOW (ref >60)
Glucose, Bld: 218 mg/dL — ABNORMAL HIGH (ref 70–99)
Potassium: 4.6 mmol/L (ref 3.5–5.1)
Sodium: 161 mmol/L (ref 135–145)
Total Bilirubin: 0.4 mg/dL (ref 0.3–1.2)
Total Protein: 6.8 g/dL (ref 6.5–8.1)

## 2021-12-04 LAB — GLUCOSE, CAPILLARY
Glucose-Capillary: 147 mg/dL — ABNORMAL HIGH (ref 70–99)
Glucose-Capillary: 148 mg/dL — ABNORMAL HIGH (ref 70–99)
Glucose-Capillary: 160 mg/dL — ABNORMAL HIGH (ref 70–99)
Glucose-Capillary: 174 mg/dL — ABNORMAL HIGH (ref 70–99)
Glucose-Capillary: 175 mg/dL — ABNORMAL HIGH (ref 70–99)
Glucose-Capillary: 87 mg/dL (ref 70–99)

## 2021-12-04 LAB — CBC
HCT: 32.1 % — ABNORMAL LOW (ref 36.0–46.0)
Hemoglobin: 9.4 g/dL — ABNORMAL LOW (ref 12.0–15.0)
MCH: 26.9 pg (ref 26.0–34.0)
MCHC: 29.3 g/dL — ABNORMAL LOW (ref 30.0–36.0)
MCV: 92 fL (ref 80.0–100.0)
Platelets: 209 10*3/uL (ref 150–400)
RBC: 3.49 MIL/uL — ABNORMAL LOW (ref 3.87–5.11)
RDW: 17.1 % — ABNORMAL HIGH (ref 11.5–15.5)
WBC: 5.4 10*3/uL (ref 4.0–10.5)
nRBC: 0 % (ref 0.0–0.2)

## 2021-12-04 LAB — BASIC METABOLIC PANEL
Anion gap: 12 (ref 5–15)
BUN: 106 mg/dL — ABNORMAL HIGH (ref 8–23)
CO2: 21 mmol/L — ABNORMAL LOW (ref 22–32)
Calcium: 10.9 mg/dL — ABNORMAL HIGH (ref 8.9–10.3)
Chloride: 127 mmol/L — ABNORMAL HIGH (ref 98–111)
Creatinine, Ser: 2.67 mg/dL — ABNORMAL HIGH (ref 0.44–1.00)
GFR, Estimated: 19 mL/min — ABNORMAL LOW (ref >60)
Glucose, Bld: 226 mg/dL — ABNORMAL HIGH (ref 70–99)
Potassium: 4.4 mmol/L (ref 3.5–5.1)
Sodium: 160 mmol/L — ABNORMAL HIGH (ref 135–145)

## 2021-12-04 LAB — HEMOGLOBIN A1C
Hgb A1c MFr Bld: 5.2 % (ref 4.8–5.6)
Mean Plasma Glucose: 102.54 mg/dL

## 2021-12-04 MED ORDER — SODIUM CHLORIDE 0.9 % IV SOLN
3.0000 g | Freq: Once | INTRAVENOUS | Status: AC
  Administered 2021-12-04: 3 g via INTRAVENOUS
  Filled 2021-12-04 (×2): qty 8

## 2021-12-04 MED ORDER — IOHEXOL 350 MG/ML SOLN
75.0000 mL | Freq: Once | INTRAVENOUS | Status: AC | PRN
  Administered 2021-12-04: 75 mL via INTRAVENOUS

## 2021-12-04 MED ORDER — SODIUM CHLORIDE 0.9 % IV SOLN
1.5000 g | Freq: Two times a day (BID) | INTRAVENOUS | Status: DC
  Administered 2021-12-05 – 2021-12-06 (×4): 1.5 g via INTRAVENOUS
  Filled 2021-12-04 (×7): qty 4

## 2021-12-04 NOTE — Progress Notes (Addendum)
PROGRESS NOTE    Joanna [name]  DJM:[id number] DOB: [birth year] DOA: 12/03/2021 PCP: [name], MD    Brief Narrative:  71 year old female who is a resident of a family care home, with history of hypertension, diabetes, chronic kidney disease stage IIIb, mild dementia, brought to the hospital with progressive encephalopathy.  Noted to be dehydrated with acute kidney injury, hypernatremia.  Imaging also indicated possible aspiration pneumonia.  Started on antibiotics.  She is on IV fluids for rehydration.   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 intake -Continue IV fluids and monitor labs/urine output -We will check renal ultrasound -I suspect the initial creatinine value of 0.6 on admission was an erroneous lab result  Hypernatremia -Due to dehydration and poor p.o. intake -Continue hypotonic fluids with D5W  Aspiration pneumonia -Noted on CT imaging -Started on Unasyn  Hypothyroidism TSH 42, which has been trending down over the past 3 months from 59 She is continued on levothyroxine, but will change to IV since unable to take p.o. at this time  Type 2 diabetes -Continued on sliding scale insulin -She did have some  hypoglycemia earlier today, this has corrected with dextrose infusion  Chronic neuromuscular deficits -Chronic left-sided spastic contractures -Patient has been chronically bedbound  Dementia -Recent notes from PCP comment on mild dementia -Family reports that she is normally awake, alert, able to participate in conversation appropriately -Her daughter also reports that over the past few weeks she has been having decreased p.o. intake, at times refusing to eat or eating very little -Her mood otherwise appears to be normal, engaged until she became encephalopathic -She had a similar incident last year where she became extremely dehydrated, but then began to eat and her acute issues at that time resolved -Unclear if her decreased p.o. intake is related to progression of dementia -We will need to reassess mental status once uremia and other electrolyte abnormalities have been corrected -If she continues to refuse any p.o. intake or does not take in enough p.o. intake, will need to readdress goals of care with patient's family  Pressure injuries,  Left sacrum, stage II present on admission Right medial buttocks, stage II, present on admission Right heel, deep tissue pressure injury, present on admission Pressure Injury 12/03/21 Sacrum Left;Medial Stage 2 -  Partial thickness loss of dermis presenting as a shallow open injury with a red, pink wound bed without slough. 4cm x 5 cm x 0 cm wound - pink/red wound base with some black spots medially, no draina (Active)  12/03/21 1830  Location: Sacrum  Location Orientation: Left;Medial  Staging: Stage 2 -  Partial thickness loss of dermis presenting as a shallow open injury with a red, pink wound bed without slough.  Wound Description (Comments): 4cm x 5 cm  x 0 cm wound - pink/red wound base with some black spots medially, no drainage noted  Present on Admission: Yes     Pressure Injury 12/03/21 Buttocks Right;Medial Stage 2 -  Partial thickness  loss of dermis presenting as a shallow open injury with a red, pink wound bed without slough. 1cm x 1 cm x 0 cm, no drainage (Active)  12/03/21 1830  Location: Buttocks  Location Orientation: Right;Medial  Staging: Stage 2 -  Partial thickness loss of dermis presenting as a shallow open injury with a red, pink wound bed without slough.  Wound Description (Comments): 1cm x 1 cm x 0 cm, no drainage  Present on Admission: Yes     Pressure Injury 12/03/21 Heel Right;Medial Deep Tissue Pressure Injury - Purple or maroon localized area of discolored intact skin or blood-filled blister due to damage of underlying soft tissue from pressure and/or shear. 0.5cm x 0.5 cm (Active)  12/03/21 1830  Location: Heel  Location Orientation: Right;Medial  Staging: Deep Tissue Pressure Injury - Purple or maroon localized area of discolored intact skin or blood-filled blister due to damage of underlying soft tissue from pressure and/or shear.  Wound Description (Comments): 0.5cm x 0.5 cm  Present on Admission: Yes  Seen by [place], appreciate input       DVT prophylaxis: heparin injection 5,000 Units Start: 12/03/21 2200 SCDs Start: 12/03/21 1829 Place TED hose Start: 12/03/21 1829  Code Status: Full code Family Communication: Updated patient's daughter over the phone Disposition Plan: Status is: Inpatient Remains inpatient appropriate because: Continued encephalopathy, renal failure and electrolyte derangements             Consultants:    Procedures:    Antimicrobials:  Unasyn   Subjective: Patient is awake.  She does make eye contact, but does not answer questions or follow commands.  Objective: Vitals:   12/03/21 2035 12/04/21 0008 12/04/21 0600 12/04/21 1400  BP: (!) 156/110 (!) 137/96 (!) 152/101 130/89  Pulse: 86 82 82 90  Resp: 18  19 16   Temp: 97.8 F (36.6 C) 97.7 F (36.5 C) 98.3 F (36.8 C) 97.9 F (36.6 C)  TempSrc: Oral Oral Axillary Oral  SpO2: 98% 98% 97% 97%   Weight:      Height:        Intake/Output Summary (Last 24 hours) at 12/04/2021 2102 Last data filed at 12/04/2021 1700 Gross per 24 hour  Intake 823.1 ml  Output 600 ml  Net 223.1 ml   Filed Weights   12/03/21 1326  Weight: 48 kg    Examination:  General exam: Appears calm and comfortable  Respiratory system: Clear to auscultation. Respiratory effort normal. Cardiovascular system: S1 & S2 heard, RRR. No JVD, murmurs, rubs, gallops or clicks.  Gastrointestinal system: Abdomen is nondistended, soft and nontender. No organomegaly or masses felt. Normal bowel sounds heard. Central nervous system: Limited exam since patient cannot follow commands.  Does not appear to have any focal deficits. Extremities: Symmetric  Skin: No rashes, lesions or ulcers Psychiatry: Awake, does not answer questions or follow commands    Data Reviewed: I have personally reviewed following labs and imaging studies  CBC: Recent Labs  Lab 12/03/21 1353 12/04/21 0423  WBC 5.8 5.4  NEUTROABS 4.1  --   HGB 12.6 9.4*  HCT 36.3 32.1*  MCV 96.5 92.0  PLT 180 629   Basic Metabolic Panel: Recent Labs  Lab 12/03/21 1353 12/04/21 0730 12/04/21 1319  NA 136 160* 161*  K 3.4* 4.4  4.6  CL 108 127* 129*  CO2 20* 21* 19*  GLUCOSE 130* 226* 218*  BUN 11 106* 108*  CREATININE 0.69 2.67* 2.47*  CALCIUM 9.8 10.9* 10.4*   GFR: Estimated Creatinine Clearance: 16.3 mL/min (A) (by C-G formula based on SCr of 2.47 mg/dL (H)). Liver Function Tests: Recent Labs  Lab 12/03/21 1353 12/04/21 1319  AST 21 18  ALT 17 17  ALKPHOS 72 74  BILITOT 0.7 0.4  PROT 6.5 6.8  ALBUMIN 3.2* 2.7*   No results for input(s): LIPASE, AMYLASE in the last 168 hours. No results for input(s): AMMONIA in the last 168 hours. Coagulation Profile: No results for input(s): INR, PROTIME in the last 168 hours. Cardiac Enzymes: No results for input(s): CKTOTAL, CKMB, CKMBINDEX, TROPONINI in the last 168 hours. BNP (last 3  results) No results for input(s): PROBNP in the last 8760 hours. HbA1C: Recent Labs    12/03/21 1353  HGBA1C 5.2   CBG: Recent Labs  Lab 12/04/21 0003 12/04/21 0600 12/04/21 0726 12/04/21 1136 12/04/21 1649  GLUCAP 147* 174* 160* 175* 148*   Lipid Profile: No results for input(s): CHOL, HDL, LDLCALC, TRIG, CHOLHDL, LDLDIRECT in the last 72 hours. Thyroid Function Tests: Recent Labs    12/03/21 1439  TSH 42.419*  FREET4 1.03   Anemia Panel: No results for input(s): VITAMINB12, FOLATE, FERRITIN, TIBC, IRON, RETICCTPCT in the last 72 hours. Sepsis Labs: Recent Labs  Lab 12/03/21 1353  LATICACIDVEN 0.9    Recent Results (from the past 240 hour(s))  Resp Panel by RT-PCR (Flu A&B, Covid) Nasopharyngeal Swab     Status: None   Collection Time: 12/03/21  5:39 PM   Specimen: Nasopharyngeal Swab; Nasopharyngeal(NP) swabs in vial transport medium  Result Value Ref Range Status   SARS Coronavirus 2 by RT PCR NEGATIVE NEGATIVE Final    Comment: (NOTE) SARS-CoV-2 target nucleic acids are NOT DETECTED.  The SARS-CoV-2 RNA is generally detectable in upper respiratory specimens during the acute phase of infection. The lowest concentration of SARS-CoV-2 viral copies this assay can detect is 138 copies/mL. A negative result does not preclude SARS-Cov-2 infection and should not be used as the sole basis for treatment or other patient management decisions. A negative result may occur with  improper specimen collection/handling, submission of specimen other than nasopharyngeal swab, presence of viral mutation(s) within the areas targeted by this assay, and inadequate number of viral copies(<138 copies/mL). A negative result must be combined with clinical observations, patient history, and epidemiological information. The expected result is Negative.  Fact Sheet for Patients:  EntrepreneurPulse.com.au  Fact Sheet for Healthcare Providers:   IncredibleEmployment.be  This test is no t yet approved or cleared by the [place] FDA and  has been authorized for detection and/or diagnosis of SARS-CoV-2 by FDA under an Emergency Use Authorization (EUA). This EUA will remain  in effect (meaning this test can be used) for the duration of the COVID-19 declaration under Section 564(b)(1) of the Act, 21 U.S.C.section 360bbb-3(b)(1), unless the authorization is terminated  or revoked sooner.       Influenza A by PCR NEGATIVE NEGATIVE Final   Influenza B by PCR NEGATIVE NEGATIVE Final    Comment: (NOTE) The Xpert Xpress SARS-CoV-2/FLU/RSV plus assay is intended as an aid in the diagnosis of influenza from Nasopharyngeal swab specimens and should not be used as a sole basis for treatment. Nasal washings and aspirates are unacceptable for Xpert Xpress SARS-CoV-2/FLU/RSV testing.  Fact Sheet for Patients: EntrepreneurPulse.com.au  Fact Sheet  for Healthcare Providers: IncredibleEmployment.be  This test is not yet approved or cleared by the [place] and has been authorized for detection and/or diagnosis of SARS-CoV-2 by FDA under an Emergency Use Authorization (EUA). This EUA will remain in effect (meaning this test can be used) for the duration of the COVID-19 declaration under Section 564(b)(1) of the Act, 21 U.S.C. section 360bbb-3(b)(1), unless the authorization is terminated or revoked.  Performed at [facility], [address] [place], [place] [address]   Culture, blood (Routine X 2) w Reflex to ID Panel     Status: None (Preliminary result)   Collection Time: 12/03/21  6:53 PM   Specimen: BLOOD RIGHT ARM  Result Value Ref Range Status   Specimen Description BLOOD RIGHT ARM BOTTLES DRAWN AEROBIC ONLY  Final   Special Requests   Final    Blood Culture results may not be optimal due to an inadequate volume of blood received in culture bottles   Culture    Final    NO GROWTH < 24 HOURS Performed at [facility], [address], [place], [place] [address]    Report Status PENDING  Incomplete  Culture, blood (Routine X 2) w Reflex to ID Panel     Status: None (Preliminary result)   Collection Time: 12/03/21  6:54 PM   Specimen: BLOOD RIGHT WRIST  Result Value Ref Range Status   Specimen Description BLOOD RIGHT WRIST BOTTLES DRAWN AEROBIC ONLY  Final   Special Requests   Final    Blood Culture results may not be optimal due to an inadequate volume of blood received in culture bottles   Culture   Final    NO GROWTH < 24 HOURS Performed at [facility], [address], [place],  [address]    Report Status PENDING  Incomplete         Radiology Studies: CT ANGIO HEAD NECK W WO CM  Result Date: 12/04/2021 CLINICAL DATA:  71 year old female with neurologic deficit, altered mental status. EXAM: CT ANGIOGRAPHY HEAD AND NECK TECHNIQUE: Multidetector CT imaging of the head and neck was performed using the standard protocol during bolus administration of intravenous contrast. Multiplanar CT image reconstructions and MIPs were obtained to evaluate the vascular anatomy. Carotid stenosis measurements (when applicable) are obtained utilizing NASCET criteria, using the distal internal carotid diameter as the denominator. RADIATION DOSE REDUCTION: This exam was performed according to the departmental dose-optimization program which includes automated exposure control, adjustment of the mA and/or kV according to patient size and/or use of iterative reconstruction technique. CONTRAST:  39mL OMNIPAQUE IOHEXOL 350 MG/ML SOLN COMPARISON:  Head CT 12/03/2021. Brain MRI 07/08/2021. Carotid ultrasound 04/20/2021. Chest CT 05/20/2021. FINDINGS: CT HEAD Brain: Scaphocephaly. Stable non contrast CT appearance of the brain. Patchy and confluent bilateral cerebral white matter hypodensity. Small area of cortical encephalomalacia in the right superior frontal gyrus.  Calvarium and skull base: No acute osseous abnormality identified. Paranasal sinuses: Visualized paranasal sinuses and mastoids are clear. Orbits: No acute orbit or scalp soft tissue finding. CTA NECK Skeleton: Previous C4-C5 and C5-C6 ACDF with solid arthrodesis. Superimposed cervical spine degeneration. No acute osseous abnormality identified. Upper chest: Opacified bronchus intermedius with patchy and nodular peribronchial opacity in the posterior right upper lobe and throughout the visible superior segment of the right lower lobe. Some upper lobe airway thickening and/or opacification. Negative left lung. Visible central pulmonary arteries appear intact. No superior mediastinal lymphadenopathy. Other neck: No acute neck soft tissue finding. Aortic arch: Tortuous aortic arch.  Calcified aortic atherosclerosis. 3 vessel arch configuration. Right carotid system: Tortuous brachiocephalic artery and proximal right CCA without plaque or stenosis. Negative right carotid bifurcation. Tortuous right ICA distal to the bulb but no stenosis. Left carotid system: Negative aside from tortuosity. Vertebral arteries: Tortuous proximal right subclavian artery with mild calcified plaque and no stenosis. Normal right vertebral artery origin. Tortuous right vertebral artery appears somewhat dominant and is patent to the skull base without stenosis. Left vertebral artery negative aside from tortuosity, mildly non dominant. CTA HEAD Posterior circulation: Dominant right vertebral V4 segment is tortuous with calcified atherosclerosis but no stenosis. Negative left V4 segment. Normal PICA origins. Tortuous vertebrobasilar junction and basilar artery with minimal calcified plaque and no stenosis. Patent SCA and PCA origins. Posterior communicating arteries are diminutive or absent. Bilateral PCA branches are normal aside from tortuosity. Anterior circulation: Both ICA siphons are patent. Mild siphon tortuosity. Mild calcified  atherosclerosis and irregularity of the cavernous and supraclinoid segments but no significant stenosis. Patent carotid termini. Normal MCA and ACA origins. Mildly tortuous A1 segments. Normal anterior communicating artery. Bilateral ACA branches are within normal limits. Left MCA M1 segment bifurcates early without stenosis. Right MCA M1 and bifurcation are patent without stenosis. Bilateral MCA branches appear normal aside from tortuosity. Venous sinuses: Early contrast timing but grossly patent. Anatomic variants: Dominant right vertebral artery. Review of the MIP images confirms the above findings IMPRESSION: 1. Negative for large vessel occlusion. Pronounced generalized arterial tortuosity in the head and neck. But mild for age atherosclerosis and no significant stenosis. 2. Abnormal Right Lung, with opacified bronchus intermedius, abnormal right upper and lower lobe peribronchial opacity. Constellation favors favor Multilobar Pneumonia Secondary To Aspiration. Follow-up Chest Radiographs may be valuable. 3. Stable CT appearance of the brain. Previous C4-C5 and C5-C6 ACDF with solid arthrodesis. 4. Aortic Atherosclerosis (ICD10-I70.0). Electronically Signed   By: Genevie Ann M.D.   On: 12/04/2021 08:57   DG Chest 1 View  Result Date: 12/03/2021 CLINICAL DATA:  Altered mental status EXAM: CHEST  1 VIEW COMPARISON:  07/09/2021 FINDINGS: The heart size and mediastinal contours are within normal limits. Both lungs are clear. The visualized skeletal structures are unremarkable. IMPRESSION: No acute abnormality of the lungs in AP portable projection. Electronically Signed   By: Delanna Ahmadi M.D.   On: 12/03/2021 14:05   CT HEAD WO CONTRAST  Result Date: 12/03/2021 CLINICAL DATA:  Mental status changes. EXAM: CT HEAD WITHOUT CONTRAST TECHNIQUE: Contiguous axial images were obtained from the base of the skull through the vertex without intravenous contrast. RADIATION DOSE REDUCTION: This exam was performed  according to the departmental dose-optimization program which includes automated exposure control, adjustment of the mA and/or kV according to patient size and/or use of iterative reconstruction technique. COMPARISON:  07/07/2021 FINDINGS: Brain: There is no evidence for acute hemorrhage, hydrocephalus, mass lesion, or abnormal extra-axial fluid collection. No definite CT evidence for acute infarction. Patchy low attenuation in the deep hemispheric and periventricular white matter is nonspecific, but likely reflects chronic microvascular ischemic demyelination. Vascular: No hyperdense vessel or unexpected calcification. Skull: No evidence for fracture. No worrisome lytic or sclerotic lesion. Sinuses/Orbits: The visualized paranasal sinuses and mastoid air cells are clear. Visualized portions of the globes and intraorbital fat are unremarkable. Other: None. IMPRESSION: 1. No acute intracranial abnormality. 2. Chronic small vessel ischemic disease. Electronically Signed   By: Misty Stanley M.D.   On: 12/03/2021 14:03        Scheduled Meds:  ascorbic acid  500 mg Oral Daily   aspirin  300 mg Rectal Daily   DULoxetine  60 mg Oral Daily   ferrous sulfate  325 mg Oral Q breakfast   folic acid  1 mg Oral Daily   heparin  5,000 Units Subcutaneous Q8H   insulin aspart  0-6 Units Subcutaneous TID WC   levothyroxine  100 mcg Intravenous Daily   multivitamin with minerals  1 tablet Oral Daily   pantoprazole (PROTONIX) IV  40 mg Intravenous Q24H   rosuvastatin  20 mg Oral Daily   sodium chloride flush  3 mL Intravenous Q12H   sodium chloride flush  3 mL Intravenous Q12H   thiamine  100 mg Oral Daily   topiramate  50 mg Oral BID   vitamin B-12  1,000 mcg Oral Daily   zinc sulfate  220 mg Oral Daily   Continuous Infusions:  sodium chloride     dextrose 100 mL/hr at 12/04/21 1456     LOS: 0 days    Time spent: 70mins    Kathie Dike, MD [facility]   If 7PM-7AM, please contact  night-coverage [email or web address]  12/04/2021, 9:02 PM

## 2021-12-04 NOTE — Progress Notes (Signed)
Patient is not alert enough to take oral meds this morning. MD Memon notified.

## 2021-12-04 NOTE — Progress Notes (Signed)
Pharmacy Antibiotic Note  Joanna [name] is a 71 y.o. female admitted on 12/03/2021 with pneumonia.  Pharmacy has been consulted for Unasyn dosing.  Plan: Unasyn 3gm x 1 then 1.5gm IV q12h (renally adjusted)  Height: 5\' 2"  (157.5 cm) Weight: 48 kg (105 lb 13.1 oz) IBW/kg (Calculated) : 50.1  Temp (24hrs), Avg:98.1 F (36.7 C), Min:97.7 F (36.5 C), Max:98.5 F (36.9 C)  Recent Labs  Lab 12/03/21 1353 12/04/21 0423 12/04/21 0730 12/04/21 1319  WBC 5.8 5.4  --   --   CREATININE 0.69  --  2.67* 2.47*  LATICACIDVEN 0.9  --   --   --     Estimated Creatinine Clearance: 16.3 mL/min (A) (by C-G formula based on SCr of 2.47 mg/dL (H)).    Allergies  Allergen Reactions   Zoloft [Sertraline Hcl] Other (See Comments)    Made the patient feel withdrawn and had bad dreams- "Allergic," per MAR   Ivp Dye [Iodinated Contrast Media] Rash and Other (See Comments)    "Allergic," per MAR    Antimicrobials this admission: Unasyn 2/19 >>    Dose adjustments this admission:   Microbiology results:  BCx:   UCx:    Sputum:    MRSA PCR:   Thank you for allowing pharmacy to be a part of this patients care.  Hart Robinsons A 12/04/2021 10:29 PM

## 2021-12-04 NOTE — Consult Note (Signed)
WOC Nurse Consult Note: Reason for Consult: Healing stage 3 pressure injury to right side of sacrum, also two areas of vascular infarct vs DTPI to right heel Wound type:Pressure Pressure Injury POA: Yes Measurement: Right sacral:  2cm x 4cm open area in an areas of 7cm x 7cm previously healed full thickness tissue loss (scarring). Pink (60% and white (40%) tissue. Mild maceration in surrounding area. Right heel with two small (<0.4cm) round purple places on patient's skin. No warmth, bogginess  Wound bed:As noted above Drainage (amount, consistency, odor) Small serous from sacrum, none from foot Periwound:As noted above Dressing procedure/placement/frequency: I have provided nursing with guidance for the care of these wounds using an antimicrobial nonadherent (xeroform) to the sacrum topped with a silicone foam. The heel is to be dressed daily and a povidone iodine swabstick applied to the areas and allowed to dry prior to redressing and placign foot (feet) into pressure redistribution heel boots. Patient is incontinent of bowel and bladder.  Left UE contraction inhibits turning and reposition.  [place] nursing team will not follow, but will remain available to this patient, the nursing and medical teams.  Please re-consult if needed. Thanks, Maudie Flakes, MSN, RN, [place], Arther Abbott  Pager# [phone]

## 2021-12-04 NOTE — Plan of Care (Signed)
  Problem: Education: Goal: Knowledge of General Education information will improve Description Including pain rating scale, medication(s)/side effects and non-pharmacologic comfort measures Outcome: Progressing   Problem: Health Behavior/Discharge Planning: Goal: Ability to manage health-related needs will improve Outcome: Progressing   

## 2021-12-04 NOTE — Evaluation (Signed)
Clinical/Bedside Swallow Evaluation Patient Details  Name: Joanna [name] MRN: [id number] Date of Birth: [birth year]  Today's Date: 12/04/2021 Time: SLP Start Time (ACUTE ONLY): 1006 SLP Stop Time (ACUTE ONLY): 1028 SLP Time Calculation (min) (ACUTE ONLY): 22 min  Past Medical History:  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 [facility]   Bilateral headaches    Depression    Diabetes mellitus    Diverticulosis    Dyslipidemia    GERD (gastroesophageal reflux disease)    Hypertension    Insomnia    Microproteinuria    Renal insufficiency 2010   Right carpal tunnel syndrome 12/24/2017   Sleep apnea    Sleep apnea 12/25/2017   Abnormal sleep study February 2019 CPAP ordered   Thyroid disease    hypothyroid   Past Surgical History:  Past Surgical History:  Procedure Laterality Date   ABDOMINAL HYSTERECTOMY  1984   BACK SURGERY     BILATERAL OOPHORECTOMY     BIOPSY  02/19/2020   Procedure: BIOPSY;  Surgeon: [name], MD;  [facility]: [facility];  Service: Endoscopy;;  gastric   BIOPSY  11/01/2021   Procedure: BIOPSY;  Surgeon: Harvel Quale, MD;  Location: [facility];  Service: Gastroenterology;;   CERVICAL [place] SURGERY  01/16/2019   COLONOSCOPY  01/29/2012   Fields-2-3 hyperplastic polyps, mod internal hemorrhoids, diverticulosis throughout colon   COLONOSCOPY N/A 07/03/2014   Dr. Oneida Alar: Hyperplastic polyps   COLONOSCOPY N/A 06/06/2019   Dr. Oneida Alar: Diverticulosis, hemorrhoids, single polyp removed, pathology revealed lipoma.  Next colonoscopy 10 years   ENTEROSCOPY N/A 11/01/2021   Procedure: ENTEROSCOPY;  Surgeon: Harvel Quale, MD;  Location: [facility];  Service: Gastroenterology;  Laterality: N/A;   ESOPHAGOGASTRODUODENOSCOPY  01/29/2012   Fields-2-3cm sliding HH, fundic gland polyps, NEGATIVE H pylori   ESOPHAGOGASTRODUODENOSCOPY N/A 07/03/2014   Dr. Oneida Alar: gastritis and  normal small bowel biopsies   ESOPHAGOGASTRODUODENOSCOPY N/A 02/19/2020   Fields: hypertrophic folds in the stomach. benign polyp removed   ESOPHAGOGASTRODUODENOSCOPY (EGD) WITH PROPOFOL N/A 11/01/2021   Procedure: ESOPHAGOGASTRODUODENOSCOPY (EGD) WITH PROPOFOL;  Surgeon: Harvel Quale, MD;  [facility]: [facility];  Service: Gastroenterology;  Laterality: N/A;   [facility] CAPSULE STUDY N/A 10/26/2014   SMALL BOWEL AVMs   KNEE ARTHROSCOPY Right    POLYPECTOMY  06/06/2019   Procedure: POLYPECTOMY;  Surgeon: [name], MD;  Location: [facility];  Service: Endoscopy;;   SMALL BOWEL ENTEROSCOPY  11/01/2021   normal esophagus, non bleeding gastric ulcers with clean ulcer base (forres class III) some associated erosions in antrum, nodular mucosa in gastric fundus and gastric body, normal duodenum   TOTAL ABDOMINAL HYSTERECTOMY W/ BILATERAL SALPINGOOPHORECTOMY     HPI:  Joanna [name]  is a 71 y.o. female of stroke with left-sided spastic contractures, small bowel AVMs/GI bleeds requiring transfusion,, severe hypothyroidism, diabetes mellitus type 2, hypertension, , chronic urinary incontinence who is brought in from [facility] with concerns of being noncommunicative since 8 PM on 12/02/2021.Patient was noted to be moving the right side spontaneously, she does have chronic left-sided weakness with left upper extremity spastic changes. UA is not indicative of an infection at this time. Chest x-ray without acute cardiopulmonary finding. CT head without contrast without acute finding. Pt had BSE at [facility] in September with recommendation for regular textures and thin liquids. BSE requested.    Assessment / Plan / Recommendation  Clinical Impression  Pt currently presents with cognitive based dysphagia exacerbated by lethargy. Pt attempted to follow some basic one step commands intermittently. Pt with generalized oral weakness, suspect due to lethargy. She manipulated the ice chip and  accepted tsp presentations of thin water which resulted in suspected delayed swallow trigger and delayed cough. Pt unable to suck from the straw, however when SLP provided small amount of water at the end of the straw with SLP occluding the top, she closed lips around the straw with some sucking motion and eventual swallow with delayed cough. Pt is currently not alert enough and not engaged enough for safe po intake. Recommend continue NPO with oral care and ok for single ice chips and can try oral meds crushed in a small amount of puree if necessary. Above to RN. SLP will check back tomorrow. SLP Visit Diagnosis: Dysphagia, unspecified (R13.10)    Aspiration Risk  Moderate aspiration risk;Risk for inadequate nutrition/hydration    Diet Recommendation Ice chips PRN after oral care;NPO except meds   Medication Administration: Crushed with puree    Other  Recommendations Oral Care Recommendations: Oral care prior to ice chip/H20;Staff/trained caregiver to provide oral care    Recommendations for follow up therapy are one component of a multi-disciplinary discharge planning process, led by the attending physician.  Recommendations may be updated based on patient status, additional functional criteria and insurance authorization.  Follow up Recommendations Skilled nursing-short term rehab (<3 hours/day)      Assistance Recommended at Discharge Frequent or constant Supervision/Assistance  Functional Status Assessment Patient has had a recent decline in their functional status and demonstrates the ability to make significant improvements in function in a reasonable and predictable amount of time.  Frequency and Duration min 2x/week  1 week       Prognosis Prognosis for Safe Diet Advancement: Guarded Barriers to Reach Goals: Cognitive deficits (lethargy)      Swallow Study   General Date of Onset: 12/03/21 HPI: Joanna [name]  is a 71 y.o. female of stroke with left-sided spastic contractures,  small bowel AVMs/GI bleeds requiring transfusion,, severe hypothyroidism, diabetes mellitus type 2, hypertension, , chronic urinary incontinence who is brought in from [facility] with concerns of being noncommunicative since 8 PM on 12/02/2021.Patient was noted to be moving the right side spontaneously, she does have chronic left-sided weakness with left upper extremity spastic changes. UA is not indicative of an infection at this time. Chest x-ray without acute cardiopulmonary finding. CT head without contrast without acute finding. Pt had BSE at [facility] in September with recommendation for regular textures and thin liquids. BSE requested. Type of Study: Bedside Swallow Evaluation Previous Swallow Assessment: BSE at [facility] in Septempber 2022 with recommendation for regular/thin Diet Prior to this Study: NPO Temperature Spikes Noted: No Respiratory Status: Room air History of Recent Intubation: No Behavior/Cognition: Lethargic/Drowsy Oral Cavity Assessment: Within Functional Limits Oral Care Completed by SLP: Recent completion by staff Oral Cavity - Dentition: Poor condition Vision: Impaired for self-feeding Self-Feeding Abilities: Total assist Patient Positioning: Upright in bed Baseline Vocal Quality: Not observed Volitional Cough: Congested;Cognitively unable to elicit Volitional Swallow: Unable to elicit    Oral/Motor/Sensory Function Overall Oral Motor/Sensory Function: Generalized oral weakness   Ice Chips Ice chips: Impaired Presentation: Spoon Oral Phase Impairments: Poor awareness of bolus Oral Phase Functional Implications: Prolonged oral transit Pharyngeal Phase Impairments: Cough - Delayed;Suspected delayed Swallow   Thin Liquid Thin Liquid: Impaired Presentation: Spoon;Straw Oral Phase Impairments: Reduced labial seal;Reduced lingual movement/coordination;Poor awareness of  bolus Oral Phase Functional Implications: Oral holding Pharyngeal  Phase Impairments: Cough -  Delayed;Suspected delayed Swallow    Nectar Thick Nectar Thick Liquid: Not tested   Honey Thick Honey Thick Liquid: Not tested   Puree Puree: Not tested   Solid     Solid: Not tested     Thank you,  Joanna [name], Joanna [name]  Atilano Covelli 12/04/2021,10:26 AM

## 2021-12-05 ENCOUNTER — Encounter (HOSPITAL_COMMUNITY): Payer: Self-pay | Admitting: Internal Medicine

## 2021-12-05 ENCOUNTER — Inpatient Hospital Stay: Payer: Self-pay

## 2021-12-05 ENCOUNTER — Inpatient Hospital Stay (HOSPITAL_COMMUNITY): Payer: PPO

## 2021-12-05 ENCOUNTER — Other Ambulatory Visit: Payer: Self-pay

## 2021-12-05 DIAGNOSIS — E860 Dehydration: Secondary | ICD-10-CM | POA: Diagnosis not present

## 2021-12-05 DIAGNOSIS — G9341 Metabolic encephalopathy: Secondary | ICD-10-CM | POA: Diagnosis not present

## 2021-12-05 DIAGNOSIS — N179 Acute kidney failure, unspecified: Secondary | ICD-10-CM | POA: Diagnosis not present

## 2021-12-05 DIAGNOSIS — I10 Essential (primary) hypertension: Secondary | ICD-10-CM | POA: Diagnosis not present

## 2021-12-05 LAB — GLUCOSE, CAPILLARY
Glucose-Capillary: 46 mg/dL — ABNORMAL LOW (ref 70–99)
Glucose-Capillary: 14 mg/dL — CL (ref 70–99)
Glucose-Capillary: 60 mg/dL — ABNORMAL LOW (ref 70–99)
Glucose-Capillary: 107 mg/dL — ABNORMAL HIGH (ref 70–99)
Glucose-Capillary: 147 mg/dL — ABNORMAL HIGH (ref 70–99)
Glucose-Capillary: 61 mg/dL — ABNORMAL LOW (ref 70–99)
Glucose-Capillary: 236 mg/dL — ABNORMAL HIGH (ref 70–99)

## 2021-12-05 LAB — COMPREHENSIVE METABOLIC PANEL
ALT: 14 U/L (ref 0–44)
AST: 11 U/L — ABNORMAL LOW (ref 15–41)
Albumin: 2.3 g/dL — ABNORMAL LOW (ref 3.5–5.0)
Alkaline Phosphatase: 66 U/L (ref 38–126)
Anion gap: 8 (ref 5–15)
BUN: 88 mg/dL — ABNORMAL HIGH (ref 8–23)
CO2: 19 mmol/L — ABNORMAL LOW (ref 22–32)
Calcium: 10.2 mg/dL (ref 8.9–10.3)
Chloride: 123 mmol/L — ABNORMAL HIGH (ref 98–111)
Creatinine, Ser: 2.2 mg/dL — ABNORMAL HIGH (ref 0.44–1.00)
GFR, Estimated: 24 mL/min — ABNORMAL LOW (ref >60)
Glucose, Bld: 203 mg/dL — ABNORMAL HIGH (ref 70–99)
Potassium: 3.4 mmol/L — ABNORMAL LOW (ref 3.5–5.1)
Sodium: 150 mmol/L — ABNORMAL HIGH (ref 135–145)
Total Bilirubin: 0.5 mg/dL (ref 0.3–1.2)
Total Protein: 5.8 g/dL — ABNORMAL LOW (ref 6.5–8.1)

## 2021-12-05 LAB — CBC
HCT: 29.4 % — ABNORMAL LOW (ref 36.0–46.0)
Hemoglobin: 8.5 g/dL — ABNORMAL LOW (ref 12.0–15.0)
MCH: 25.7 pg — ABNORMAL LOW (ref 26.0–34.0)
MCHC: 28.9 g/dL — ABNORMAL LOW (ref 30.0–36.0)
MCV: 88.8 fL (ref 80.0–100.0)
Platelets: 185 10*3/uL (ref 150–400)
RBC: 3.31 MIL/uL — ABNORMAL LOW (ref 3.87–5.11)
RDW: 16.7 % — ABNORMAL HIGH (ref 11.5–15.5)
WBC: 7.5 10*3/uL (ref 4.0–10.5)
nRBC: 0.4 % — ABNORMAL HIGH (ref 0.0–0.2)

## 2021-12-05 LAB — URINE CULTURE
Culture: NO GROWTH
Special Requests: NORMAL

## 2021-12-05 LAB — GLUCOSE, RANDOM: Glucose, Bld: 159 mg/dL — ABNORMAL HIGH (ref 70–99)

## 2021-12-05 MED ORDER — THIAMINE HCL 100 MG/ML IJ SOLN
100.0000 mg | Freq: Every day | INTRAMUSCULAR | Status: DC
  Administered 2021-12-05 – 2021-12-11 (×7): 100 mg via INTRAVENOUS
  Filled 2021-12-05 (×8): qty 2

## 2021-12-05 MED ORDER — DEXTROSE 50 % IV SOLN
12.5000 g | INTRAVENOUS | Status: AC
  Administered 2021-12-05: 12.5 g via INTRAVENOUS
  Filled 2021-12-05: qty 50

## 2021-12-05 MED ORDER — SODIUM CHLORIDE 0.9% FLUSH: 10.0000 mL | INTRAVENOUS | Status: DC | PRN

## 2021-12-05 MED ORDER — FOLIC ACID 5 MG/ML IJ SOLN
INTRAMUSCULAR | Status: AC
  Filled 2021-12-05: qty 0.2

## 2021-12-05 MED ORDER — POTASSIUM CHLORIDE 10 MEQ/100ML IV SOLN
10.0000 meq | INTRAVENOUS | Status: AC
  Administered 2021-12-05 (×4): 10 meq via INTRAVENOUS
  Filled 2021-12-05 (×4): qty 100

## 2021-12-05 MED ORDER — SODIUM CHLORIDE 0.9% FLUSH
10.0000 mL | Freq: Two times a day (BID) | INTRAVENOUS | Status: DC
  Administered 2021-12-06 – 2021-12-07 (×2): 10 mL
  Administered 2021-12-08: 20 mL
  Administered 2021-12-09: 10 mL
  Administered 2021-12-10: 20 mL
  Administered 2021-12-10 – 2021-12-11 (×2): 10 mL

## 2021-12-05 MED ORDER — SODIUM CHLORIDE 0.9 % IV SOLN
1.0000 mg | Freq: Once | INTRAVENOUS | Status: AC
  Administered 2021-12-06: 1 mg via INTRAVENOUS
  Filled 2021-12-05: qty 0.2

## 2021-12-05 MED ORDER — ENSURE ENLIVE PO LIQD
237.0000 mL | Freq: Two times a day (BID) | ORAL | Status: DC
  Administered 2021-12-10 – 2021-12-12 (×5): 237 mL via ORAL

## 2021-12-05 MED ORDER — CHLORHEXIDINE GLUCONATE CLOTH 2 % EX PADS
6.0000 | MEDICATED_PAD | Freq: Every day | CUTANEOUS | Status: DC
  Administered 2021-12-06 – 2021-12-12 (×8): 6 via TOPICAL

## 2021-12-05 MED ORDER — DEXTROSE 10 % IV SOLN: INTRAVENOUS | Status: DC

## 2021-12-05 NOTE — Progress Notes (Signed)
Speech Language Pathology Treatment: Dysphagia  Patient Details Name: Joanna [name] MRN: [id number] DOB: 1951/04/20 Today's Date: 12/05/2021 Time: 3570-1779 SLP Time Calculation (min) (ACUTE ONLY): 25 min  Assessment / Plan / Recommendation Clinical Impression  Pt seen for ongoing dysphagia intervention following BSE completed yesterday. Pt is more alert today and verbalized, "soda" when asked what she liked to drink, otherwise limited vocalizations. Pt required mod/max cues for attention to task and verbal cues to "swallow" at times. She had one episode of delayed throat clear after oral holding of water. Pt continues to be at risk for aspiration due to AMS and currently dependent feeder status, however can initiate D1/puree and thin liquids and nursing staff to feed Pt only when she is alert and upright, po medications crushed or whole in puree as able. SLP will follow during acute stay.     HPI HPI: Joanna [name]  is a 71 y.o. female of stroke with left-sided spastic contractures, small bowel AVMs/GI bleeds requiring transfusion,, severe hypothyroidism, diabetes mellitus type 2, hypertension, , chronic urinary incontinence who is brought in from [facility] with concerns of being noncommunicative since 8 PM on 12/02/2021.Patient was noted to be moving the right side spontaneously, she does have chronic left-sided weakness with left upper extremity spastic changes. UA is not indicative of an infection at this time. Chest x-ray without acute cardiopulmonary finding. CT head without contrast without acute finding. Pt had BSE at [facility] in September with recommendation for regular textures and thin liquids. BSE requested.      SLP Plan  Continue with current plan of care      Recommendations for follow up therapy are one component of a multi-disciplinary discharge planning process, led by the attending physician.  Recommendations may be updated based on patient status, additional functional  criteria and insurance authorization.    Recommendations  Diet recommendations: Dysphagia 1 (puree);Thin liquid Liquids provided via: Cup;Straw Medication Administration: Crushed with puree Supervision: Staff to assist with self feeding;Full supervision/cueing for compensatory strategies Compensations: Slow rate;Small sips/bites;Multiple dry swallows after each bite/sip Postural Changes and/or Swallow Maneuvers: Seated upright 90 degrees;Upright 30-60 min after meal                Oral Care Recommendations: Oral care BID;Staff/trained caregiver to provide oral care Follow Up Recommendations: Skilled nursing-short term rehab (<3 hours/day) Assistance recommended at discharge: Frequent or constant Supervision/Assistance SLP Visit Diagnosis: Dysphagia, unspecified (R13.10) Plan: Continue with current plan of care          Thank you,  Genene Churn, [place]  [place]  12/05/2021, 3:18 PM

## 2021-12-05 NOTE — Progress Notes (Signed)
Hypoglycemic Event  CBG: 61  Treatment: D50 25 mL (12.5 gm)  Symptoms: None  Follow-up CBG: Time:2251 CBG Result:236  Possible Reasons for Event: Inadequate meal intake  Comments/MD notified:Dr. Zierle-Ghosh notified. CBG obtained by ear stick. Dextrose 10 IV restarted at 2139 . IVF have not been running since 1900 due to IVF not available.  Instructed by Dr. Clearence Ped to give D50 12.5G and recheck in 30-60 minutes.     Joanna [name]

## 2021-12-05 NOTE — Progress Notes (Signed)
PICC team attempted to get consent. Spoke with nephew Carin Primrose, who states he is not primary contact for providing consent/making medical decisions. He states patient's daughter Fonda Kinder is main contact. Attempted to call [place] at number listed in chart, but unable to contact. Primary RN made aware at this time. PICC team cannot place PICC until contact made and consent obtained.

## 2021-12-05 NOTE — Progress Notes (Signed)
PROGRESS NOTE    Joanna [name]  KGU:[id number] DOB: Sep 18, 1951 DOA: 12/03/2021 PCP: [name], MD    Brief Narrative:  71 year old female who is a resident of a family care home, with history of hypertension, diabetes, chronic kidney disease stage IIIb, mild dementia, brought to the hospital with progressive encephalopathy.  Noted to be dehydrated with acute kidney injury, hypernatremia.  Imaging also indicated possible aspiration pneumonia.  Started on antibiotics.  She is on IV fluids for rehydration.   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)   Hypokalemia   Dehydration   AKI (acute kidney injury) ([place])   Hypernatremia   Stage 3b chronic kidney disease (HCC)   Spastic hemiparesis of left nondominant side (HCC)   Acute encephalopathy   Pressure injury of skin   Acute metabolic encephalopathy -Likely related to renal failure and hypernatremia due to dehydration -CT head and CTA head and neck did not show any acute findings -We will continue with IV fluids and monitor electrolytes/urine output -Since admission, appears to be making very slow improvements  Acute kidney injury on chronic kidney disease stage IIIb -Related to dehydration from decreased p.o. intake -Continue IV fluids and monitor labs/urine output -Renal ultrasound without evidence of hydronephrosis -I suspect the initial creatinine value of 0.6 on admission was an erroneous lab result -Baseline creatinine likely ranging between 1.2-1.6 -Admission creatinine 2.6, currently improved to 2.2 with hydration  Hypernatremia -Due to dehydration and poor p.o. intake -Continue hypotonic fluids  -Serum sodium is improving  Hypoalbuminemia/anasarca -We will need to hydrate cautiously with IV  fluids -She is prone to third space fluid which may lead to pulmonary edema and shortness of breath  Aspiration pneumonia -Noted on CT imaging -Started on Unasyn  Hypothyroidism TSH 42, which has been trending down over the past 3 months from 59 She is continued on levothyroxine, but will change to IV since unable to take p.o. at this time  Type 2 diabetes -Continued on sliding scale insulin -She was noted to have episodes of hypoglycemia, although these may not have been accurate since blood was drawn from area of fingers that had some cyanosis. -Confirmatory serum glucoses have not been low  Chronic neuromuscular deficits -Chronic left-sided spastic contractures -Patient has been chronically bedbound  Dementia -Recent notes from PCP comment on mild dementia -Family reports that she is normally awake, alert, able to participate in conversation appropriately -Her daughter also reports that over the past few weeks she has been having decreased p.o. intake, at times refusing to eat or eating very little -Her mood otherwise appeared to be normal/engaged until she became encephalopathic -She had a similar incident last year where she became extremely dehydrated, but then began to eat and her acute issues at that time resolved.  She was admitted to [facility] 06/2021. -Unclear if her current decreased p.o. intake is related to progression of dementia -We will need to reassess mental status once uremia and other electrolyte abnormalities have been corrected -If she continues to refuse any p.o. intake or does not take in enough p.o. intake, will need to readdress goals of care with patient's family  Goals of care -During her last admission in September, she was seen by palliative care at that time and family had agreed to DNR status -This  was reconfirmed with patient's daughter, that DNR is still in place -I mentioned that it would be helpful if palliative care follow in her hospital course  as another layer of support and to help address goals of care as her condition evolves. -Ms. Redmond Pulling is agreeable to palliative consult  Pressure injuries,  Left sacrum, stage II present on admission Right medial buttocks, stage II, present on admission Right heel, deep tissue pressure injury, present on admission Pressure Injury 12/03/21 Sacrum Left;Medial Stage 2 -  Partial thickness loss of dermis presenting as a shallow open injury with a red, pink wound bed without slough. 4cm x 5 cm x 0 cm wound - pink/red wound base with some black spots medially, no draina (Active)  12/03/21 1830  Location: Sacrum  Location Orientation: Left;Medial  Staging: Stage 2 -  Partial thickness loss of dermis presenting as a shallow open injury with a red, pink wound bed without slough.  Wound Description (Comments): 4cm x 5 cm x 0 cm wound - pink/red wound base with some black spots medially, no drainage noted  Present on Admission: Yes     Pressure Injury 12/03/21 Buttocks Right;Medial Stage 2 -  Partial thickness loss of dermis presenting as a shallow open injury with a red, pink wound bed without slough. 1cm x 1 cm x 0 cm, no drainage (Active)  12/03/21 1830  Location: Buttocks  Location Orientation: Right;Medial  Staging: Stage 2 -  Partial thickness loss of dermis presenting as a shallow open injury with a red, pink wound bed without slough.  Wound Description (Comments): 1cm x 1 cm x 0 cm, no drainage  Present on Admission: Yes     Pressure Injury 12/03/21 Heel Right;Medial Deep Tissue Pressure Injury - Purple or maroon localized area of discolored intact skin or blood-filled blister due to damage of underlying soft tissue from pressure and/or shear. 0.5cm x 0.5 cm (Active)  12/03/21 1830  Location: Heel  Location Orientation: Right;Medial  Staging: Deep Tissue Pressure Injury - Purple or maroon localized area of discolored intact skin or blood-filled blister due to damage of underlying soft tissue  from pressure and/or shear.  Wound Description (Comments): 0.5cm x 0.5 cm  Present on Admission: Yes  Seen by Edmond, appreciate input       DVT prophylaxis: heparin injection 5,000 Units Start: 12/03/21 2200 SCDs Start: 12/03/21 1829 Place TED hose Start: 12/03/21 1829  Code Status: DNR, confirmed with patient's daughter Family Communication: Updated patient's daughter over the phone Disposition Plan: Status is: Inpatient Remains inpatient appropriate because: Continued encephalopathy, renal failure and electrolyte derangements     Consultants:    Procedures:  PICC line 2/20  Antimicrobials:  Unasyn   Subjective: She is sleeping on my arrival, wakes up to voice.  Does not engage in conversation.  Staff says that she was a little bit more alert today than she has been.  Objective: Vitals:   12/04/21 1400 12/04/21 2117 12/05/21 0533 12/05/21 1238  BP: 130/89 (!) 147/99 (!) 129/99 (!) 144/94  Pulse: 90 93 89 83  Resp: 16 17 18 19   Temp: 97.9 F (36.6 C) 98.5 F (36.9 C) 98 F (36.7 C) 97.7 F (36.5 C)  TempSrc: Oral  Axillary Oral  SpO2: 97% (!) 88% 98% 99%  Weight:      Height:        Intake/Output Summary (Last 24 hours) at 12/05/2021 2030 Last data filed at 12/05/2021 1858 Gross per 24 hour  Intake 1146.67 ml  Output  301 ml  Net 845.67 ml   Filed Weights   12/03/21 1326  Weight: 48 kg    Examination:  General exam: Somnolent, briefly opens eyes to voice but falls back asleep Respiratory system: Clear to auscultation. Respiratory effort normal. Cardiovascular system:RRR. No murmurs, rubs, gallops. Gastrointestinal system: Abdomen is nondistended, soft and nontender. No organomegaly or masses felt. Normal bowel sounds heard. Central nervous system: Unable to assess due to mental status Extremities: She is starting to develop some anasarca.  Some cyanotic changes noted in the tips of her fingers of her hands bilaterally.  Pulses intact at wrist  bilaterally.  No cyanotic changes in toes. Skin: No rashes, lesions or ulcers Psychiatry: Somnolent, does not engage in conversation     Data Reviewed: I have personally reviewed following labs and imaging studies  CBC: Recent Labs  Lab 12/03/21 1353 12/04/21 0423 12/05/21 0513  WBC 5.8 5.4 7.5  NEUTROABS 4.1  --   --   HGB 12.6 9.4* 8.5*  HCT 36.3 32.1* 29.4*  MCV 96.5 92.0 88.8  PLT 180 209 761   Basic Metabolic Panel: Recent Labs  Lab 12/03/21 1353 12/04/21 0730 12/04/21 1319 12/05/21 0513 12/05/21 0918  NA 136 160* 161* 150*  --   K 3.4* 4.4 4.6 3.4*  --   CL 108 127* 129* 123*  --   CO2 20* 21* 19* 19*  --   GLUCOSE 130* 226* 218* 203* 159*  BUN 11 106* 108* 88*  --   CREATININE 0.69 2.67* 2.47* 2.20*  --   CALCIUM 9.8 10.9* 10.4* 10.2  --    GFR: Estimated Creatinine Clearance: 18.3 mL/min (A) (by C-G formula based on SCr of 2.2 mg/dL (H)). Liver Function Tests: Recent Labs  Lab 12/03/21 1353 12/04/21 1319 12/05/21 0513  AST 21 18 11*  ALT 17 17 14   ALKPHOS 72 74 66  BILITOT 0.7 0.4 0.5  PROT 6.5 6.8 5.8*  ALBUMIN 3.2* 2.7* 2.3*   No results for input(s): LIPASE, AMYLASE in the last 168 hours. No results for input(s): AMMONIA in the last 168 hours. Coagulation Profile: No results for input(s): INR, PROTIME in the last 168 hours. Cardiac Enzymes: No results for input(s): CKTOTAL, CKMB, CKMBINDEX, TROPONINI in the last 168 hours. BNP (last 3 results) No results for input(s): PROBNP in the last 8760 hours. HbA1C: Recent Labs    12/03/21 1353  HGBA1C 5.2   CBG: Recent Labs  Lab 12/05/21 0605 12/05/21 0722 12/05/21 0828 12/05/21 1232 12/05/21 1636  GLUCAP 46* 14* 60* 107* 147*   Lipid Profile: No results for input(s): CHOL, HDL, LDLCALC, TRIG, CHOLHDL, LDLDIRECT in the last 72 hours. Thyroid Function Tests: Recent Labs    12/03/21 1439  TSH 42.419*  FREET4 1.03   Anemia Panel: No results for input(s): VITAMINB12, FOLATE,  FERRITIN, TIBC, IRON, RETICCTPCT in the last 72 hours. Sepsis Labs: Recent Labs  Lab 12/03/21 1353  LATICACIDVEN 0.9    Recent Results (from the past 240 hour(s))  Urine Culture     Status: None   Collection Time: 12/03/21  1:37 PM   Specimen: In/Out Cath Urine  Result Value Ref Range Status   Specimen Description   Final    IN/OUT CATH URINE Performed at [facility], [address], [place], [place] [address]    Special Requests   Final    Normal Performed at [facility], [address], [place], [place] [address]    Culture   Final    NO  GROWTH Performed at [facility], [place] [address], [place], [place] [address]    Report Status 12/05/2021 FINAL  Final  Resp Panel by RT-PCR (Flu A&B, Covid) Nasopharyngeal Swab     Status: None   Collection Time: 12/03/21  5:39 PM   Specimen: Nasopharyngeal Swab; Nasopharyngeal(NP) swabs in vial transport medium  Result Value Ref Range Status   SARS Coronavirus 2 by RT PCR NEGATIVE NEGATIVE Final    Comment: (NOTE) SARS-CoV-2 target nucleic acids are NOT DETECTED.  The SARS-CoV-2 RNA is generally detectable in upper respiratory specimens during the acute phase of infection. The lowest concentration of SARS-CoV-2 viral copies this assay can detect is 138 copies/mL. A negative result does not preclude SARS-Cov-2 infection and should not be used as the sole basis for treatment or other patient management decisions. A negative result may occur with  improper specimen collection/handling, submission of specimen other than nasopharyngeal swab, presence of viral mutation(s) within the areas targeted by this assay, and inadequate number of viral copies(<138 copies/mL). A negative result must be combined with clinical observations, patient history, and epidemiological information. The expected result is Negative.  Fact Sheet for Patients:  EntrepreneurPulse.com.au  Fact Sheet for Healthcare Providers:   IncredibleEmployment.be  This test is no t yet approved or cleared by the [place] FDA and  has been authorized for detection and/or diagnosis of SARS-CoV-2 by FDA under an Emergency Use Authorization (EUA). This EUA will remain  in effect (meaning this test can be used) for the duration of the COVID-19 declaration under Section 564(b)(1) of the Act, 21 U.S.C.section 360bbb-3(b)(1), unless the authorization is terminated  or revoked sooner.       Influenza A by PCR NEGATIVE NEGATIVE Final   Influenza B by PCR NEGATIVE NEGATIVE Final    Comment: (NOTE) The Xpert Xpress SARS-CoV-2/FLU/RSV plus assay is intended as an aid in the diagnosis of influenza from Nasopharyngeal swab specimens and should not be used as a sole basis for treatment. Nasal washings and aspirates are unacceptable for Xpert Xpress SARS-CoV-2/FLU/RSV testing.  Fact Sheet for Patients: EntrepreneurPulse.com.au  Fact Sheet for Healthcare Providers: IncredibleEmployment.be  This test is not yet approved or cleared by the [place] FDA and has been authorized for detection and/or diagnosis of SARS-CoV-2 by FDA under an Emergency Use Authorization (EUA). This EUA will remain in effect (meaning this test can be used) for the duration of the COVID-19 declaration under Section 564(b)(1) of the Act, 21 U.S.C. section 360bbb-3(b)(1), unless the authorization is terminated or revoked.  Performed at [facility], [address] [place], [place] [address]   Culture, blood (Routine X 2) w Reflex to ID Panel     Status: None (Preliminary result)   Collection Time: 12/03/21  6:53 PM   Specimen: BLOOD RIGHT ARM  Result Value Ref Range Status   Specimen Description BLOOD RIGHT ARM BOTTLES DRAWN AEROBIC ONLY  Final   Special Requests   Final    Blood Culture results may not be optimal due to an inadequate volume of blood received in culture bottles   Culture    Final    NO GROWTH < 24 HOURS Performed at [facility], [address] [place], [place] [address]    Report Status PENDING  Incomplete  Culture, blood (Routine X 2) w Reflex to ID Panel     Status: None (Preliminary result)   Collection Time: 12/03/21  6:54 PM   Specimen: BLOOD RIGHT WRIST  Result Value Ref Range Status  Specimen Description BLOOD RIGHT WRIST BOTTLES DRAWN AEROBIC ONLY  Final   Special Requests   Final    Blood Culture results may not be optimal due to an inadequate volume of blood received in culture bottles   Culture   Final    NO GROWTH < 24 HOURS Performed at [facility], [address], [place], [place] [address]    Report Status PENDING  Incomplete         Radiology Studies: CT ANGIO HEAD NECK W WO CM  Result Date: 12/04/2021 CLINICAL DATA:  71 year old female with neurologic deficit, altered mental status. EXAM: CT ANGIOGRAPHY HEAD AND NECK TECHNIQUE: Multidetector CT imaging of the head and neck was performed using the standard protocol during bolus administration of intravenous contrast. Multiplanar CT image reconstructions and MIPs were obtained to evaluate the vascular anatomy. Carotid stenosis measurements (when applicable) are obtained utilizing NASCET criteria, using the distal internal carotid diameter as the denominator. RADIATION DOSE REDUCTION: This exam was performed according to the departmental dose-optimization program which includes automated exposure control, adjustment of the mA and/or kV according to patient size and/or use of iterative reconstruction technique. CONTRAST:  52mL OMNIPAQUE IOHEXOL 350 MG/ML SOLN COMPARISON:  Head CT 12/03/2021. Brain MRI 07/08/2021. Carotid ultrasound 04/20/2021. Chest CT 05/20/2021. FINDINGS: CT HEAD Brain: Scaphocephaly. Stable non contrast CT appearance of the brain. Patchy and confluent bilateral cerebral white matter hypodensity. Small area of cortical encephalomalacia in the right superior frontal gyrus.  Calvarium and skull base: No acute osseous abnormality identified. Paranasal sinuses: Visualized paranasal sinuses and mastoids are clear. Orbits: No acute orbit or scalp soft tissue finding. CTA NECK Skeleton: Previous C4-C5 and C5-C6 ACDF with solid arthrodesis. Superimposed cervical spine degeneration. No acute osseous abnormality identified. Upper chest: Opacified bronchus intermedius with patchy and nodular peribronchial opacity in the posterior right upper lobe and throughout the visible superior segment of the right lower lobe. Some upper lobe airway thickening and/or opacification. Negative left lung. Visible central pulmonary arteries appear intact. No superior mediastinal lymphadenopathy. Other neck: No acute neck soft tissue finding. Aortic arch: Tortuous aortic arch. Calcified aortic atherosclerosis. 3 vessel arch configuration. Right carotid system: Tortuous brachiocephalic artery and proximal right CCA without plaque or stenosis. Negative right carotid bifurcation. Tortuous right ICA distal to the bulb but no stenosis. Left carotid system: Negative aside from tortuosity. Vertebral arteries: Tortuous proximal right subclavian artery with mild calcified plaque and no stenosis. Normal right vertebral artery origin. Tortuous right vertebral artery appears somewhat dominant and is patent to the skull base without stenosis. Left vertebral artery negative aside from tortuosity, mildly non dominant. CTA HEAD Posterior circulation: Dominant right vertebral V4 segment is tortuous with calcified atherosclerosis but no stenosis. Negative left V4 segment. Normal PICA origins. Tortuous vertebrobasilar junction and basilar artery with minimal calcified plaque and no stenosis. Patent SCA and PCA origins. Posterior communicating arteries are diminutive or absent. Bilateral PCA branches are normal aside from tortuosity. Anterior circulation: Both ICA siphons are patent. Mild siphon tortuosity. Mild calcified  atherosclerosis and irregularity of the cavernous and supraclinoid segments but no significant stenosis. Patent carotid termini. Normal MCA and ACA origins. Mildly tortuous A1 segments. Normal anterior communicating artery. Bilateral ACA branches are within normal limits. Left MCA M1 segment bifurcates early without stenosis. Right MCA M1 and bifurcation are patent without stenosis. Bilateral MCA branches appear normal aside from tortuosity. Venous sinuses: Early contrast timing but grossly patent. Anatomic variants: Dominant right vertebral artery. Review of the MIP images confirms the above  findings IMPRESSION: 1. Negative for large vessel occlusion. Pronounced generalized arterial tortuosity in the head and neck. But mild for age atherosclerosis and no significant stenosis. 2. Abnormal Right Lung, with opacified bronchus intermedius, abnormal right upper and lower lobe peribronchial opacity. Constellation favors favor Multilobar Pneumonia Secondary To Aspiration. Follow-up Chest Radiographs may be valuable. 3. Stable CT appearance of the brain. Previous C4-C5 and C5-C6 ACDF with solid arthrodesis. 4. Aortic Atherosclerosis (ICD10-I70.0). Electronically Signed   By: Genevie Ann M.D.   On: 12/04/2021 08:57   US RENAL  Result Date: 12/05/2021 CLINICAL DATA:  AKI EXAM: RENAL / URINARY TRACT ULTRASOUND COMPLETE COMPARISON:  Renal ultrasound 07/07/2021, CT abdomen/pelvis 11/07/2019 FINDINGS: Right Kidney: Renal measurements: 9.3 cm x 4.3 cm x 5.6 cm = volume: 117 mL. Parenchymal echogenicity is increased. There is no hydronephrosis. Left Kidney: Renal measurements: 8.4 cm x 4.5 cm x 4.6 cm = volume: 91 mL. Parenchymal echogenicity is increased. There is no hydronephrosis. Bladder: Appears normal for degree of bladder distention. Other: None. IMPRESSION: Increased parenchymal echogenicity bilaterally consistent with medical renal disease. No hydronephrosis. Electronically Signed   By: Valetta Mole M.D.   On: 12/05/2021  11:32   [place] EKG SITE RITE  Result Date: 12/05/2021 If [facility] image not attached, placement could not be confirmed due to current cardiac rhythm.       Scheduled Meds:  aspirin  300 mg Rectal Daily   Chlorhexidine Gluconate Cloth  6 each Topical Daily   [START ON 12/06/2021] feeding supplement  237 mL Oral BID BM   heparin  5,000 Units Subcutaneous Q8H   insulin aspart  0-6 Units Subcutaneous TID WC   levothyroxine  100 mcg Intravenous Daily   pantoprazole (PROTONIX) IV  40 mg Intravenous Q24H   sodium chloride flush  10-40 mL Intracatheter Q12H   sodium chloride flush  3 mL Intravenous Q12H   sodium chloride flush  3 mL Intravenous Q12H   thiamine injection  100 mg Intravenous Daily   Continuous Infusions:  sodium chloride     ampicillin-sulbactam (UNASYN) IV 1.5 g (12/05/21 0945)   dextrose 100 mL/hr at 56/38/93 7342   folic acid (FOLVITE) IVPB     potassium chloride       LOS: 1 day    Time spent: 93mins    Kathie Dike, MD [facility]   If 7PM-7AM, please contact night-coverage [email or web address]  12/05/2021, 8:30 PM

## 2021-12-05 NOTE — TOC Initial Note (Signed)
Transition of Care [facility]) - Initial/Assessment Note    Patient Details  Name: Joanna [name] MRN: [id number] Date of Birth: 25-Jul-1951  Transition of Care [facility]) CM/SW Contact:    Ihor Gully, LCSW Phone Number: 12/05/2021, 2:19 PM  Clinical Narrative:                 Patient from [facility]. Admitted for Acute metabolic encephalopathy. Total care at baseline. Uses lift and wheelchair. Discussed d/c plan with daughter. Daughter plans for patient to return to [facility] at d/c (patient is family to facility owners/administrators).   Expected Discharge Plan:  [facility]) Barriers to Discharge: Continued Medical Work up   Patient Goals and CMS Choice        Expected Discharge Plan and Services Expected Discharge Plan:  [facility])                                              Prior Living Arrangements/Services   Lives with:: Facility Resident Patient language and need for interpreter reviewed:: Yes Do you feel safe going back to the place where you live?: Yes      Need for Family Participation in Patient Care: Yes (Comment) Care giver support system in place?: Yes (comment) Current home services: DME (w/c) Criminal Activity/Legal Involvement Pertinent to Current Situation/Hospitalization: No - Comment as needed  Activities of Daily Living      Permission Sought/Granted Permission sought to share information with : Family Supports    Share Information with NAME: Daughter, [name]           Emotional Assessment         Alcohol / Substance Use: Not Applicable Psych Involvement: No (comment)  Admission diagnosis:  Encephalopathy acute [Y63.78] Acute metabolic encephalopathy [H88.50] Acute encephalopathy [G93.40] Patient Active Problem List   Diagnosis Date Noted   Acute encephalopathy 12/04/2021   Pressure injury of skin 27/74/1287   Acute metabolic encephalopathy 86/76/7209   ABLA (acute blood loss anemia) 11/01/2021   Melena    Blood loss anemia  10/31/2021   GI bleed 10/31/2021   Spastic hemiparesis of left nondominant side ([place]) 10/27/2021   Stage 3b chronic kidney disease ([place]) 08/23/2021   Left-sided weakness    COVID    Protein-calorie malnutrition, severe 07/16/2021   Altered mental status    Acute renal failure superimposed on stage 4 chronic kidney disease ([place]) 06/26/2021   Dehydration 06/25/2021   AKI (acute kidney injury) ([place]) 06/25/2021   Fall at home, initial encounter 06/25/2021   Hypernatremia 06/25/2021   Dizziness and giddiness 04/19/2021   Hypomagnesemia 04/19/2021   Hypokalemia 04/19/2021   Near syncope 04/19/2021   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 ([place]) 01/19/2021   Non-recurrent acute suppurative otitis media of left ear without spontaneous rupture of tympanic membrane 01/06/2021   Seasonal allergic rhinitis due to pollen 01/06/2021   Abnormal CT scan, stomach 02/06/2020   Coronary artery disease due to lipid rich plaque 11/19/2019   Aortic atherosclerosis ([place]) 11/19/2019   Constipation 10/06/2019   AVM (arteriovenous malformation) of small bowel, acquired    Pain in joint of left shoulder 05/13/2019   Pain of left hand 04/24/2019   Adhesive capsulitis of left shoulder 04/16/2019   Pain in left foot 12/06/2018   IDA (iron deficiency anemia) 12/03/2018   Left hand weakness 08/16/2018   Numbness 07/10/2018  Sleep apnea 12/25/2017   Right carpal tunnel syndrome 12/24/2017   Chronic anxiety 02/13/2017   Right rotator cuff tendinitis 02/11/2015   Iron deficiency 08/25/2014   Microcytic anemia 06/17/2014   Insomnia 05/04/2014   Hypothyroidism 06/05/2013   Essential hypertension, benign 06/05/2013   Stress fracture of right foot 02/27/2013   Chronic radicular lumbar pain 02/27/2013   Hyperlipidemia 01/16/2013   Type 2 diabetes mellitus ([place]) 01/16/2013   PCP:  [name], MD Pharmacy:   [facility], [place] [address] [place] [address] Phone: [phone] Fax: [phone]     Social Determinants of Health (SDOH) Interventions    Readmission Risk Interventions Readmission Risk Prevention Plan 11/01/2021 06/27/2021  Transportation Screening Complete Complete  HRI or Home Care Consult Complete Complete  Social Work Consult for [place] Planning/Counseling Complete Complete  Palliative Care Screening Not Applicable Not Applicable  Medication Review Press photographer) Complete Complete  Some recent data might be hidden

## 2021-12-05 NOTE — Progress Notes (Signed)
PT Cancellation Note  Patient Details Name: YOLANI VO MRN: [id number] DOB: [birth year]   Cancelled Treatment:    Reason Eval/Treat Not Completed: PT screened, no needs identified, will sign off.  Patient total assist at baseline per SW/CM   12:03 PM, 12/05/21 Lonell Grandchild, MPT Physical Therapist with [facility] 336 [phone] office [phone] mobile phone

## 2021-12-05 NOTE — Care Management Important Message (Signed)
Important Message  Patient Details  Name: Joanna [name] MRN: [id number] Date of Birth: Oct 03, 1951   Medicare Important Message Given:  Yes (mailed [address], [place], [place] [address])     Tommy Medal 12/05/2021, 11:53 AM

## 2021-12-05 NOTE — Progress Notes (Signed)
Pt has orders to get an PICC line placed. Dr. Roderic Palau made me aware that Dr. Moshe Cipro in Nephrology is ok with placement of PICC line. Vascular services made aware of this as well.

## 2021-12-05 NOTE — Progress Notes (Signed)
Peripherally Inserted Central Catheter Placement  The IV Nurse has discussed with the patient and/or persons authorized to consent for the patient, the purpose of this procedure and the potential benefits and risks involved with this procedure.  The benefits include less needle sticks, lab draws from the catheter, and the patient may be discharged home with the catheter. Risks include, but not limited to, infection, bleeding, blood clot (thrombus formation), and puncture of an artery; nerve damage and irregular heartbeat and possibility to perform a PICC exchange if needed/ordered by physician.  Alternatives to this procedure were also discussed.  Bard Power PICC patient education guide, fact sheet on infection prevention and patient information card has been provided to patient /or left at bedside.   Consent obtained via telephone with daughter  PICC Placement Documentation  PICC Double Lumen 12/05/21 Right Brachial 37 cm 0 cm (Active)  Indication for Insertion or Continuance of Line Poor Vasculature-patient has had multiple peripheral attempts or PIVs lasting less than 24 hours 12/05/21 1800  Exposed Catheter (cm) 0 cm 12/05/21 1800  Site Assessment Clean, Dry, Intact 12/05/21 1800  Lumen #1 Status Flushed;Saline locked;Blood return noted 12/05/21 1800  Lumen #2 Status Flushed;Saline locked;Blood return noted 12/05/21 1800  Dressing Type Securing device;Transparent 12/05/21 1800  Dressing Status Antimicrobial disc in place 12/05/21 1800  Dressing Intervention New dressing 12/05/21 1800  Dressing Change Due 12/12/21 12/05/21 1800       Joanna [name] 12/05/2021, 6:31 PM

## 2021-12-06 ENCOUNTER — Ambulatory Visit (HOSPITAL_COMMUNITY): Payer: PPO | Admitting: Physician Assistant

## 2021-12-06 DIAGNOSIS — I10 Essential (primary) hypertension: Secondary | ICD-10-CM | POA: Diagnosis not present

## 2021-12-06 DIAGNOSIS — N179 Acute kidney failure, unspecified: Secondary | ICD-10-CM | POA: Diagnosis not present

## 2021-12-06 DIAGNOSIS — G9341 Metabolic encephalopathy: Secondary | ICD-10-CM | POA: Diagnosis not present

## 2021-12-06 DIAGNOSIS — E860 Dehydration: Secondary | ICD-10-CM | POA: Diagnosis not present

## 2021-12-06 LAB — COMPREHENSIVE METABOLIC PANEL
ALT: 15 U/L (ref 0–44)
AST: 11 U/L — ABNORMAL LOW (ref 15–41)
Albumin: 2 g/dL — ABNORMAL LOW (ref 3.5–5.0)
Alkaline Phosphatase: 68 U/L (ref 38–126)
Anion gap: 6 (ref 5–15)
BUN: 67 mg/dL — ABNORMAL HIGH (ref 8–23)
CO2: 18 mmol/L — ABNORMAL LOW (ref 22–32)
Calcium: 9.4 mg/dL (ref 8.9–10.3)
Chloride: 116 mmol/L — ABNORMAL HIGH (ref 98–111)
Creatinine, Ser: 2.04 mg/dL — ABNORMAL HIGH (ref 0.44–1.00)
GFR, Estimated: 26 mL/min — ABNORMAL LOW (ref >60)
Glucose, Bld: 500 mg/dL — ABNORMAL HIGH (ref 70–99)
Potassium: 3.8 mmol/L (ref 3.5–5.1)
Sodium: 140 mmol/L (ref 135–145)
Total Bilirubin: 0.3 mg/dL (ref 0.3–1.2)
Total Protein: 5.3 g/dL — ABNORMAL LOW (ref 6.5–8.1)

## 2021-12-06 LAB — CBC
HCT: 27.7 % — ABNORMAL LOW (ref 36.0–46.0)
Hemoglobin: 7.9 g/dL — ABNORMAL LOW (ref 12.0–15.0)
MCH: 25.8 pg — ABNORMAL LOW (ref 26.0–34.0)
MCHC: 28.5 g/dL — ABNORMAL LOW (ref 30.0–36.0)
MCV: 90.5 fL (ref 80.0–100.0)
Platelets: 163 10*3/uL (ref 150–400)
RBC: 3.06 MIL/uL — ABNORMAL LOW (ref 3.87–5.11)
RDW: 16.9 % — ABNORMAL HIGH (ref 11.5–15.5)
WBC: 11.5 10*3/uL — ABNORMAL HIGH (ref 4.0–10.5)
nRBC: 0.3 % — ABNORMAL HIGH (ref 0.0–0.2)

## 2021-12-06 LAB — GLUCOSE, CAPILLARY
Glucose-Capillary: 183 mg/dL — ABNORMAL HIGH (ref 70–99)
Glucose-Capillary: 195 mg/dL — ABNORMAL HIGH (ref 70–99)
Glucose-Capillary: 145 mg/dL — ABNORMAL HIGH (ref 70–99)
Glucose-Capillary: 171 mg/dL — ABNORMAL HIGH (ref 70–99)

## 2021-12-06 LAB — BASIC METABOLIC PANEL
Anion gap: 7 (ref 5–15)
BUN: 67 mg/dL — ABNORMAL HIGH (ref 8–23)
CO2: 20 mmol/L — ABNORMAL LOW (ref 22–32)
Calcium: 10 mg/dL (ref 8.9–10.3)
Chloride: 120 mmol/L — ABNORMAL HIGH (ref 98–111)
Creatinine, Ser: 2.16 mg/dL — ABNORMAL HIGH (ref 0.44–1.00)
GFR, Estimated: 24 mL/min — ABNORMAL LOW (ref >60)
Glucose, Bld: 151 mg/dL — ABNORMAL HIGH (ref 70–99)
Potassium: 3.7 mmol/L (ref 3.5–5.1)
Sodium: 147 mmol/L — ABNORMAL HIGH (ref 135–145)

## 2021-12-06 LAB — MAGNESIUM: Magnesium: 2 mg/dL (ref 1.7–2.4)

## 2021-12-06 LAB — PHOSPHORUS
Phosphorus: 1.5 mg/dL — ABNORMAL LOW (ref 2.5–4.6)
Phosphorus: 1.6 mg/dL — ABNORMAL LOW (ref 2.5–4.6)

## 2021-12-06 MED ORDER — INSULIN ASPART 100 UNIT/ML IJ SOLN
10.0000 [IU] | Freq: Once | INTRAMUSCULAR | Status: AC
  Administered 2021-12-06: 10 [IU] via SUBCUTANEOUS

## 2021-12-06 MED ORDER — SODIUM PHOSPHATES 45 MMOLE/15ML IV SOLN
45.0000 mmol | Freq: Once | INTRAVENOUS | Status: AC
  Administered 2021-12-06: 45 mmol via INTRAVENOUS
  Filled 2021-12-06: qty 15

## 2021-12-06 MED ORDER — SODIUM CHLORIDE 0.45 % IV SOLN: INTRAVENOUS | Status: DC

## 2021-12-06 NOTE — Progress Notes (Signed)
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  GOAL:  Patient will meet greater than or equal to 90% of their needs  MONITOR:  PO intake, Diet advancement, Supplement acceptance, Labs, Skin, Weight trends  REASON FOR ASSESSMENT:   Malnutrition Screening Tool    ASSESSMENT: Patient is a 71 yo female with hx of CKD-3b, DM2, GERD, Thyroid disease, renal insufficiency, HTN, mild dementia and pressure injuries. Patient [facility] from [facility] with altered mental status. Hx of hemiplegia, contracture left arm and non-verbal at admission.Acute metabolic encephalopathy.   2/20 PICC placed.   Tarry stool (medium) 2/21 per nursing.   Patient po intake documented 0%. NPO through lunch today.  Speech therapy and nursing with patient during RD visit. Patient able to verbalize needs at baseline per report. Dysphagia 1 diet with thin liquids for dinner tonight. Staff to assist with 1:1 feeding per ST assessment. Risk for aspiration.   Weight loss 13%  the past 5 months which is significant. Likely malnutrition will complete NFPE on return and hopefully pt will be more verbally able to provide background nutrition intake prior to admission.    Medications: insulin, protonix, thiamine, synthroid.   IVF- D10% @ 50 ml/hr   Labs reviewed: sodium 147 (H), BUN-67 (H), Cr 2.16 (H), Phos 1.5 (L), Glucose 151 (H) improved from 500 mg/dl yesterday. 10/31/21-Iron 14 (L).   CBG (last 3)  Recent Labs    12/06/21 0304 12/06/21 0619 12/06/21 1145  GLUCAP 183* 195* 145*     NUTRITION - FOCUSED PHYSICAL EXAM: Unable to complete Nutrition-Focused physical exam at  this time.  Will obtain during follow up.    Diet Order:   Diet Order             DIET - DYS 1 Room service appropriate? Yes; Fluid consistency: Thin  Diet effective now                   EDUCATION NEEDS:  Not appropriate for education at this time  Skin:  Skin Assessment: Skin Integrity Issues: Skin Integrity Issues:: Stage II Stage II: sacrum, right buttock and right heel  Last BM:  2/20 medium- black type 6 stool  Height:   Ht Readings from Last 1 Encounters:  12/03/21 5\' 2"  (1.575 m)    Weight:   Wt Readings from Last 1 Encounters:  12/03/21 48 kg    Ideal Body Weight:   50 kg  BMI:  Body mass index is 19.35 kg/m.  Estimated Nutritional Needs:   Kcal:  9509-3267  Protein:  67-72 gr  Fluid:  >1200 ml daily  Colman Cater MS,RD,CSG,LDN Contact: Shea Evans

## 2021-12-06 NOTE — Progress Notes (Signed)
Wound care done at this time.

## 2021-12-06 NOTE — Progress Notes (Signed)
Patient son Joanna [name] is at the bedside. He requested wound care to be done at a later time. Patient is alert and oriented x2 speaking to him at this time.

## 2021-12-06 NOTE — Progress Notes (Signed)
Dr. Orlin Hilding notified that patient had a medium sized black, tarry stool.

## 2021-12-06 NOTE — Progress Notes (Addendum)
PROGRESS NOTE    Joanna [name]  EYC:[id number] DOB: 14-Mar-1951 DOA: 12/03/2021 PCP: [name], MD    Brief Narrative:  71 year old female who is a resident of a family care home, with history of hypertension, diabetes, chronic kidney disease stage IIIb, mild dementia, brought to the hospital with progressive encephalopathy.  Noted to be dehydrated with acute kidney injury, hypernatremia.  Imaging also indicated possible aspiration pneumonia.  Started on antibiotics.  She is on IV fluids for rehydration.   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)   Hypokalemia   Dehydration   AKI (acute kidney injury) ([place])   Hypernatremia   Stage 3b chronic kidney disease (HCC)   Spastic hemiparesis of left nondominant side (HCC)   Acute encephalopathy   Pressure injury of skin   Acute metabolic encephalopathy -Likely related to renal failure and hypernatremia due to dehydration -CT head and CTA head and neck did not show any acute findings -We will continue with IV fluids and monitor electrolytes/urine output -Since admission, appears to be making very slow improvements  Acute kidney injury on chronic kidney disease stage IIIb -Related to dehydration from decreased p.o. intake -Continue IV fluids and monitor labs/urine output -Renal ultrasound without evidence of hydronephrosis -I suspect the initial creatinine value of 0.6 on admission was an erroneous lab result -Baseline creatinine likely ranging between 1.2-1.6 -Admission creatinine 2.6, currently improved to 2.1 with hydration  Hypernatremia -Due to dehydration and poor p.o. intake -Continue hypotonic fluids  -Serum sodium is improving  Hypoalbuminemia/anasarca -We will need to hydrate cautiously with IV  fluids -She is prone to third space fluid which may lead to pulmonary edema and shortness of breath  Aspiration pneumonia -Noted on CT imaging -Started on Unasyn  Hypothyroidism TSH 42, which has been trending down over the past 3 months from 59 She is continued on levothyroxine, but will change to IV since unable to take p.o. at this time  Type 2 diabetes -Continued on sliding scale insulin   Chronic neuromuscular deficits -Chronic left-sided spastic contractures -Patient has been chronically bedbound  Dementia -Recent notes from PCP comment on mild dementia -Family reports that she is normally awake, alert, able to participate in conversation appropriately -Her daughter also reports that over the past few weeks she has been having decreased p.o. intake, at times refusing to eat or eating very little -Her mood otherwise appeared to be normal/engaged until she became encephalopathic -She had a similar incident last year where she became extremely dehydrated, but then began to eat and her acute issues at that time resolved.  She was admitted to [facility] 06/2021. -Unclear if her current decreased p.o. intake is related to progression of dementia -We will need to reassess mental status once uremia and other electrolyte abnormalities have been corrected -If she continues to refuse any p.o. intake or does not take in enough p.o. intake, will need to readdress goals of care with patient's family  Goals of care -During her last admission in September, she was seen by palliative care at that time and family had agreed to DNR status -This was reconfirmed with patient's daughter, that DNR is still in place -I mentioned that it would be helpful if palliative care follow in her hospital course as another layer of support and to  help address goals of care as her condition evolves. -Ms. Redmond Pulling is agreeable to palliative consult  Pressure injuries,  Left sacrum, stage II present on  admission Right sacrum, stage 3, present on admission Right medial buttocks, stage II, present on admission Right heel, deep tissue pressure injury, present on admission Pressure Injury 12/03/21 Sacrum Left;Medial Stage 2 -  Partial thickness loss of dermis presenting as a shallow open injury with a red, pink wound bed without slough. 4cm x 5 cm x 0 cm wound - pink/red wound base with some black spots medially, no draina (Active)  12/03/21 1830  Location: Sacrum  Location Orientation: Left;Medial  Staging: Stage 2 -  Partial thickness loss of dermis presenting as a shallow open injury with a red, pink wound bed without slough.  Wound Description (Comments): 4cm x 5 cm x 0 cm wound - pink/red wound base with some black spots medially, no drainage noted  Present on Admission: Yes     Pressure Injury 12/03/21 Buttocks Right;Medial Stage 2 -  Partial thickness loss of dermis presenting as a shallow open injury with a red, pink wound bed without slough. 1cm x 1 cm x 0 cm, no drainage (Active)  12/03/21 1830  Location: Buttocks  Location Orientation: Right;Medial  Staging: Stage 2 -  Partial thickness loss of dermis presenting as a shallow open injury with a red, pink wound bed without slough.  Wound Description (Comments): 1cm x 1 cm x 0 cm, no drainage  Present on Admission: Yes     Pressure Injury 12/03/21 Heel Right;Medial Deep Tissue Pressure Injury - Purple or maroon localized area of discolored intact skin or blood-filled blister due to damage of underlying soft tissue from pressure and/or shear. 0.5cm x 0.5 cm (Active)  12/03/21 1830  Location: Heel  Location Orientation: Right;Medial  Staging: Deep Tissue Pressure Injury - Purple or maroon localized area of discolored intact skin or blood-filled blister due to damage of underlying soft tissue from pressure and/or shear.  Wound Description (Comments): 0.5cm x 0.5 cm  Present on Admission: Yes  Seen by [place], appreciate input        DVT prophylaxis: heparin injection 5,000 Units Start: 12/03/21 2200 SCDs Start: 12/03/21 1829 Place TED hose Start: 12/03/21 1829  Code Status: DNR, confirmed with patient's daughter Family Communication: Updated patient's daughter over the phone 2/21 Disposition Plan: Status is: Inpatient Remains inpatient appropriate because: Continued encephalopathy, renal failure and electrolyte derangements     Consultants:  Palliative care  Procedures:  PICC line 2/20  Antimicrobials:  Unasyn   Subjective: She wakes up to voice, she says she is thirsty and wants to try and drink something  Objective: Vitals:   12/06/21 0616 12/06/21 0621 12/06/21 1437 12/06/21 2054  BP: 140/87  140/78 126/74  Pulse: 73 (!) 101 88 99  Resp: 20  18 18   Temp: 98.2 F (36.8 C)  98.7 F (37.1 C) 98.2 F (36.8 C)  TempSrc: Oral  Oral   SpO2: (!) 74% 100% 94% 100%  Weight:      Height:        Intake/Output Summary (Last 24 hours) at 12/06/2021 2205 Last data filed at 12/06/2021 1600 Gross per 24 hour  Intake 1615.92 ml  Output 900 ml  Net 715.92 ml   Filed Weights   12/03/21 1326  Weight: 48 kg    Examination:  General exam: sleepy but wakes up to voice, no distress Respiratory system: Clear to auscultation. Respiratory effort normal. Cardiovascular system:RRR. No  murmurs, rubs, gallops. Gastrointestinal system: Abdomen is nondistended, soft and nontender. No organomegaly or masses felt. Normal bowel sounds heard. Central nervous system: Unable to assess due to mental status Extremities: She is starting to develop some anasarca.  Some cyanotic changes noted in the tips of her fingers of her hands bilaterally.  Pulses intact at wrist bilaterally.  No cyanotic changes in toes. Skin: No rashes, lesions or ulcers Psychiatry: sleepy but wakes up to voice     Data Reviewed: I have personally reviewed following labs and imaging studies  CBC: Recent Labs  Lab 12/03/21 1353  12/04/21 0423 12/05/21 0513 12/06/21 0346  WBC 5.8 5.4 7.5 11.5*  NEUTROABS 4.1  --   --   --   HGB 12.6 9.4* 8.5* 7.9*  HCT 36.3 32.1* 29.4* 27.7*  MCV 96.5 92.0 88.8 90.5  PLT 180 209 185 761   Basic Metabolic Panel: Recent Labs  Lab 12/04/21 0730 12/04/21 1319 12/05/21 0513 12/05/21 0918 12/06/21 0346 12/06/21 0848  NA 160* 161* 150*  --  140 147*  K 4.4 4.6 3.4*  --  3.8 3.7  CL 127* 129* 123*  --  116* 120*  CO2 21* 19* 19*  --  18* 20*  GLUCOSE 226* 218* 203* 159* 500* 151*  BUN 106* 108* 88*  --  67* 67*  CREATININE 2.67* 2.47* 2.20*  --  2.04* 2.16*  CALCIUM 10.9* 10.4* 10.2  --  9.4 10.0  MG  --   --   --   --  2.0  --   PHOS  --   --   --   --  1.5* 1.6*   GFR: Estimated Creatinine Clearance: 18.6 mL/min (A) (by C-G formula based on SCr of 2.16 mg/dL (H)). Liver Function Tests: Recent Labs  Lab 12/03/21 1353 12/04/21 1319 12/05/21 0513 12/06/21 0346  AST 21 18 11* 11*  ALT 17 17 14 15   ALKPHOS 72 74 66 68  BILITOT 0.7 0.4 0.5 0.3  PROT 6.5 6.8 5.8* 5.3*  ALBUMIN 3.2* 2.7* 2.3* 2.0*   No results for input(s): LIPASE, AMYLASE in the last 168 hours. No results for input(s): AMMONIA in the last 168 hours. Coagulation Profile: No results for input(s): INR, PROTIME in the last 168 hours. Cardiac Enzymes: No results for input(s): CKTOTAL, CKMB, CKMBINDEX, TROPONINI in the last 168 hours. BNP (last 3 results) No results for input(s): PROBNP in the last 8760 hours. HbA1C: No results for input(s): HGBA1C in the last 72 hours.  CBG: Recent Labs  Lab 12/05/21 2251 12/06/21 0304 12/06/21 0619 12/06/21 1145 12/06/21 1622  GLUCAP 236* 183* 195* 145* 171*   Lipid Profile: No results for input(s): CHOL, HDL, LDLCALC, TRIG, CHOLHDL, LDLDIRECT in the last 72 hours. Thyroid Function Tests: No results for input(s): TSH, T4TOTAL, FREET4, T3FREE, THYROIDAB in the last 72 hours.  Anemia Panel: No results for input(s): VITAMINB12, FOLATE, FERRITIN, TIBC,  IRON, RETICCTPCT in the last 72 hours. Sepsis Labs: Recent Labs  Lab 12/03/21 1353  LATICACIDVEN 0.9    Recent Results (from the past 240 hour(s))  Urine Culture     Status: None   Collection Time: 12/03/21  1:37 PM   Specimen: In/Out Cath Urine  Result Value Ref Range Status   Specimen Description   Final    IN/OUT CATH URINE Performed at [facility], [address] [place], [place] [address]    Special Requests   Final    Normal Performed at [facility], [place]  [address], [place], [place] [address]    Culture   Final    NO GROWTH Performed at [facility], [place] [address] [place], [place] [address]    Report Status 12/05/2021 FINAL  Final  Resp Panel by RT-PCR (Flu A&B, Covid) Nasopharyngeal Swab     Status: None   Collection Time: 12/03/21  5:39 PM   Specimen: Nasopharyngeal Swab; Nasopharyngeal(NP) swabs in vial transport medium  Result Value Ref Range Status   SARS Coronavirus 2 by RT PCR NEGATIVE NEGATIVE Final    Comment: (NOTE) SARS-CoV-2 target nucleic acids are NOT DETECTED.  The SARS-CoV-2 RNA is generally detectable in upper respiratory specimens during the acute phase of infection. The lowest concentration of SARS-CoV-2 viral copies this assay can detect is 138 copies/mL. A negative result does not preclude SARS-Cov-2 infection and should not be used as the sole basis for treatment or other patient management decisions. A negative result may occur with  improper specimen collection/handling, submission of specimen other than nasopharyngeal swab, presence of viral mutation(s) within the areas targeted by this assay, and inadequate number of viral copies(<138 copies/mL). A negative result must be combined with clinical observations, patient history, and epidemiological information. The expected result is Negative.  Fact Sheet for Patients:  EntrepreneurPulse.com.au  Fact Sheet for Healthcare Providers:   IncredibleEmployment.be  This test is no t yet approved or cleared by the [place] FDA and  has been authorized for detection and/or diagnosis of SARS-CoV-2 by FDA under an Emergency Use Authorization (EUA). This EUA will remain  in effect (meaning this test can be used) for the duration of the COVID-19 declaration under Section 564(b)(1) of the Act, 21 U.S.C.section 360bbb-3(b)(1), unless the authorization is terminated  or revoked sooner.       Influenza A by PCR NEGATIVE NEGATIVE Final   Influenza B by PCR NEGATIVE NEGATIVE Final    Comment: (NOTE) The Xpert Xpress SARS-CoV-2/FLU/RSV plus assay is intended as an aid in the diagnosis of influenza from Nasopharyngeal swab specimens and should not be used as a sole basis for treatment. Nasal washings and aspirates are unacceptable for Xpert Xpress SARS-CoV-2/FLU/RSV testing.  Fact Sheet for Patients: EntrepreneurPulse.com.au  Fact Sheet for Healthcare Providers: IncredibleEmployment.be  This test is not yet approved or cleared by the [place] FDA and has been authorized for detection and/or diagnosis of SARS-CoV-2 by FDA under an Emergency Use Authorization (EUA). This EUA will remain in effect (meaning this test can be used) for the duration of the COVID-19 declaration under Section 564(b)(1) of the Act, 21 U.S.C. section 360bbb-3(b)(1), unless the authorization is terminated or revoked.  Performed at [facility], [address], [place], [place] [address]   Culture, blood (Routine X 2) w Reflex to ID Panel     Status: None (Preliminary result)   Collection Time: 12/03/21  6:53 PM   Specimen: BLOOD RIGHT ARM  Result Value Ref Range Status   Specimen Description BLOOD RIGHT ARM BOTTLES DRAWN AEROBIC ONLY  Final   Special Requests   Final    Blood Culture results may not be optimal due to an inadequate volume of blood received in culture bottles   Culture   Setup Time   Final    GRAM POSITIVE COCCI Gram Stain Report Called to,Read Back By and Verified WithEstill Dooms DILDY @ 3500 ON 12/06/21 C VARNER    Culture   Final    NO GROWTH 3 DAYS Performed at [facility], [address] [place], [place]  [address]    Report Status PENDING  Incomplete  Culture, blood (Routine X 2) w Reflex to ID Panel     Status: None (Preliminary result)   Collection Time: 12/03/21  6:54 PM   Specimen: BLOOD RIGHT WRIST  Result Value Ref Range Status   Specimen Description BLOOD RIGHT WRIST BOTTLES DRAWN AEROBIC ONLY  Final   Special Requests   Final    Blood Culture results may not be optimal due to an inadequate volume of blood received in culture bottles   Culture   Final    NO GROWTH 3 DAYS Performed at [facility], [address] [place], [place] [address]    Report Status PENDING  Incomplete         Radiology Studies: US RENAL  Result Date: 12/05/2021 CLINICAL DATA:  AKI EXAM: RENAL / URINARY TRACT ULTRASOUND COMPLETE COMPARISON:  Renal ultrasound 07/07/2021, CT abdomen/pelvis 11/07/2019 FINDINGS: Right Kidney: Renal measurements: 9.3 cm x 4.3 cm x 5.6 cm = volume: 117 mL. Parenchymal echogenicity is increased. There is no hydronephrosis. Left Kidney: Renal measurements: 8.4 cm x 4.5 cm x 4.6 cm = volume: 91 mL. Parenchymal echogenicity is increased. There is no hydronephrosis. Bladder: Appears normal for degree of bladder distention. Other: None. IMPRESSION: Increased parenchymal echogenicity bilaterally consistent with medical renal disease. No hydronephrosis. Electronically Signed   By: Valetta Mole M.D.   On: 12/05/2021 11:32   [place] EKG SITE RITE  Result Date: 12/05/2021 If [facility] image not attached, placement could not be confirmed due to current cardiac rhythm.       Scheduled Meds:  aspirin  300 mg Rectal Daily   Chlorhexidine Gluconate Cloth  6 each Topical Daily   feeding supplement  237 mL Oral BID BM   heparin  5,000 Units  Subcutaneous Q8H   insulin aspart  0-6 Units Subcutaneous TID WC   levothyroxine  100 mcg Intravenous Daily   pantoprazole (PROTONIX) IV  40 mg Intravenous Q24H   sodium chloride flush  10-40 mL Intracatheter Q12H   sodium chloride flush  3 mL Intravenous Q12H   sodium chloride flush  3 mL Intravenous Q12H   thiamine injection  100 mg Intravenous Daily   Continuous Infusions:  sodium chloride 50 mL/hr at 12/06/21 2014   sodium chloride     ampicillin-sulbactam (UNASYN) IV 1.5 g (12/06/21 1121)     LOS: 2 days    Time spent: 27mins    Kathie Dike, MD [facility]   If 7PM-7AM, please contact night-coverage [email or web address]  12/06/2021, 10:05 PM

## 2021-12-06 NOTE — Progress Notes (Signed)
Speech Language Pathology Treatment: Dysphagia  Patient Details Name: Joanna [name] MRN: [id number] DOB: Mar 21, 1951 Today's Date: 12/06/2021 Time: 0092-3300 SLP Time Calculation (min) (ACUTE ONLY): 28 min  Assessment / Plan / Recommendation Clinical Impression  Pt provided ongoing diagnostic dysphagia therapy targeting diet tolerance and PO trials. Pt was easily roused and responsive for short periods requiring tactile and verbal cues to remain alert. When asked if Pt was thirsty she responded "yes" and asked for "water". Pt was presented single ice chips and tsp sips of water with no wet vocal quality or coughing noted. When presented a cup sip of thin Pt held bolus and then opened oral cavity and full presentation of water spilled anteriorly down her chest with little response. SLP then presented a small tsp presentation of D1/puree; Pt with brief oral holding requiring verbal cues to swallow. Pt continued to demonstrate a seemingly timely/brisk swallow with tsp presentations of water upon palpation of swallow. Recommend continue with D1/puree diet and thin liquids (via tsp). Note Pt's baseline is total feeder assist and she will continue to require 1:1 feeder assist with verbal cues to "swallow" and possibly needed to remain alert as mentation is still off from baseline. She does continue to be at risk for aspiration and at high risk for malnutrition and dehydration. ST will continue to follow during acute stay.    HPI HPI: Joanna [name]  is a 71 y.o. female of stroke with left-sided spastic contractures, small bowel AVMs/GI bleeds requiring transfusion,, severe hypothyroidism, diabetes mellitus type 2, hypertension, , chronic urinary incontinence who is brought in from [facility] with concerns of being noncommunicative since 8 PM on 12/02/2021.Patient was noted to be moving the right side spontaneously, she does have chronic left-sided weakness with left upper extremity spastic changes. UA  is not indicative of an infection at this time. Chest x-ray without acute cardiopulmonary finding. CT head without contrast without acute finding. Pt had BSE at [facility] in September with recommendation for regular textures and thin liquids.      SLP Plan  Continue with current plan of care      Recommendations for follow up therapy are one component of a multi-disciplinary discharge planning process, led by the attending physician.  Recommendations may be updated based on patient status, additional functional criteria and insurance authorization.    Recommendations  Diet recommendations: Dysphagia 1 (puree);Thin liquid Liquids provided via: Cup;Straw Medication Administration: Crushed with puree Supervision: Staff to assist with self feeding;Full supervision/cueing for compensatory strategies Compensations: Slow rate;Small sips/bites;Multiple dry swallows after each bite/sip Postural Changes and/or Swallow Maneuvers: Seated upright 90 degrees;Upright 30-60 min after meal                Oral Care Recommendations: Oral care BID;Staff/trained caregiver to provide oral care Follow Up Recommendations: Skilled nursing-short term rehab (<3 hours/day) Assistance recommended at discharge: Frequent or constant Supervision/Assistance SLP Visit Diagnosis: Dysphagia, unspecified (R13.10) Plan: Continue with current plan of care         Joanna [name] H. Joanna [name], CCC-SLP Speech Language Pathologist   Wende Bushy  12/06/2021, 11:29 AM

## 2021-12-07 DIAGNOSIS — Z515 Encounter for palliative care: Secondary | ICD-10-CM

## 2021-12-07 DIAGNOSIS — Z66 Do not resuscitate: Secondary | ICD-10-CM | POA: Diagnosis not present

## 2021-12-07 DIAGNOSIS — G9340 Encephalopathy, unspecified: Secondary | ICD-10-CM | POA: Diagnosis not present

## 2021-12-07 DIAGNOSIS — N1832 Chronic kidney disease, stage 3b: Secondary | ICD-10-CM

## 2021-12-07 DIAGNOSIS — G9341 Metabolic encephalopathy: Secondary | ICD-10-CM | POA: Diagnosis not present

## 2021-12-07 DIAGNOSIS — Z7189 Other specified counseling: Secondary | ICD-10-CM | POA: Diagnosis not present

## 2021-12-07 DIAGNOSIS — G8114 Spastic hemiplegia affecting left nondominant side: Secondary | ICD-10-CM

## 2021-12-07 LAB — CULTURE, BLOOD (ROUTINE X 2)

## 2021-12-07 LAB — COMPREHENSIVE METABOLIC PANEL
ALT: 109 U/L — ABNORMAL HIGH (ref 0–44)
AST: 111 U/L — ABNORMAL HIGH (ref 15–41)
Albumin: 2.1 g/dL — ABNORMAL LOW (ref 3.5–5.0)
Alkaline Phosphatase: 104 U/L (ref 38–126)
Anion gap: 10 (ref 5–15)
BUN: 63 mg/dL — ABNORMAL HIGH (ref 8–23)
CO2: 19 mmol/L — ABNORMAL LOW (ref 22–32)
Calcium: 9.2 mg/dL (ref 8.9–10.3)
Chloride: 119 mmol/L — ABNORMAL HIGH (ref 98–111)
Creatinine, Ser: 2.21 mg/dL — ABNORMAL HIGH (ref 0.44–1.00)
GFR, Estimated: 24 mL/min — ABNORMAL LOW (ref >60)
Glucose, Bld: 88 mg/dL (ref 70–99)
Potassium: 3.9 mmol/L (ref 3.5–5.1)
Sodium: 148 mmol/L — ABNORMAL HIGH (ref 135–145)
Total Bilirubin: 0.4 mg/dL (ref 0.3–1.2)
Total Protein: 5.9 g/dL — ABNORMAL LOW (ref 6.5–8.1)

## 2021-12-07 LAB — CBC
HCT: 28.3 % — ABNORMAL LOW (ref 36.0–46.0)
Hemoglobin: 8.3 g/dL — ABNORMAL LOW (ref 12.0–15.0)
MCH: 27.1 pg (ref 26.0–34.0)
MCHC: 29.3 g/dL — ABNORMAL LOW (ref 30.0–36.0)
MCV: 92.5 fL (ref 80.0–100.0)
Platelets: 148 10*3/uL — ABNORMAL LOW (ref 150–400)
RBC: 3.06 MIL/uL — ABNORMAL LOW (ref 3.87–5.11)
RDW: 17 % — ABNORMAL HIGH (ref 11.5–15.5)
WBC: 15.1 10*3/uL — ABNORMAL HIGH (ref 4.0–10.5)
nRBC: 0.1 % (ref 0.0–0.2)

## 2021-12-07 LAB — PHOSPHORUS: Phosphorus: 5 mg/dL — ABNORMAL HIGH (ref 2.5–4.6)

## 2021-12-07 LAB — GLUCOSE, CAPILLARY
Glucose-Capillary: 38 mg/dL — CL (ref 70–99)
Glucose-Capillary: 86 mg/dL (ref 70–99)
Glucose-Capillary: 134 mg/dL — ABNORMAL HIGH (ref 70–99)
Glucose-Capillary: 74 mg/dL (ref 70–99)

## 2021-12-07 LAB — MAGNESIUM: Magnesium: 1.9 mg/dL (ref 1.7–2.4)

## 2021-12-07 MED ORDER — POVIDONE-IODINE 10 % EX SOLN
CUTANEOUS | Status: AC
  Filled 2021-12-07: qty 14.8

## 2021-12-07 MED ORDER — DEXTROSE 50 % IV SOLN
INTRAVENOUS | Status: AC
  Filled 2021-12-07: qty 50

## 2021-12-07 MED ORDER — SODIUM CHLORIDE 0.9 % IV SOLN
3.0000 g | Freq: Two times a day (BID) | INTRAVENOUS | Status: DC
  Administered 2021-12-07 – 2021-12-09 (×5): 3 g via INTRAVENOUS
  Filled 2021-12-07 (×2): qty 8
  Filled 2021-12-07: qty 3
  Filled 2021-12-07 (×4): qty 8

## 2021-12-07 MED ORDER — DEXTROSE 5 % IV SOLN: INTRAVENOUS | Status: DC

## 2021-12-07 NOTE — Progress Notes (Signed)
PHARMACY NOTE:  ANTIMICROBIAL RENAL DOSAGE ADJUSTMENT  Current antimicrobial regimen includes a mismatch between antimicrobial dosage and estimated renal function.  As per policy approved by the Pharmacy & Therapeutics and Medical Executive Committees, the antimicrobial dosage will be adjusted accordingly.  Current antimicrobial dosage:  unasyn 1.5 g IV very 12 hours  Indication: aspiration pneumonia  Renal Function:  Estimated Creatinine Clearance: 18.2 mL/min (A) (by C-G formula based on SCr of 2.21 mg/dL (H)). []      On intermittent HD, scheduled: []      On CRRT    Antimicrobial dosage has been changed to:  Unasyn 3000 mg IV every 12 hours  Additional comments:   Thank you for allowing pharmacy to be a part of this patient's care.  Ramond Craver, [facility] 12/07/2021 8:48 AM

## 2021-12-07 NOTE — Progress Notes (Signed)
PROGRESS NOTE  Joanna [name] EUM:[id number] DOB: 1951/09/14 DOA: 12/03/2021 PCP: [name], MD  Brief History:  71 year old female who is a resident of a family care home, with history of hypertension, diabetes, chronic kidney disease stage IIIb, mild dementia, brought to the hospital with progressive encephalopathy.  Noted to be dehydrated with acute kidney injury, hypernatremia.  Imaging also indicated possible aspiration pneumonia.  Started on antibiotics.  She is on IV fluids for rehydration She was started on 1/2NS for hypernatremia.  This was changed to D5W when pt had hypoglycemia. Palliative medicine was consulted to assist.  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)   Hypokalemia   Dehydration   AKI (acute kidney injury) ([place])   Hypernatremia   Stage 3b chronic kidney disease (HCC)   Spastic hemiparesis of left nondominant side (HCC)   Acute encephalopathy   Pressure injury of skin     Acute metabolic encephalopathy -Likely related to renal failure and hypernatremia due to dehydration -CT head and CTA head and neck did not show any acute findings -We will continue with IV fluids and monitor electrolytes/urine output -Since admission, appears to be making very slow improvements   Acute kidney injury on chronic kidney disease stage IIIb -Related to dehydration from decreased p.o. intake -Continue IV fluids  -Renal ultrasound without evidence of hydronephrosis -I suspect the initial creatinine value of 0.6 on admission was an erroneous lab result -Baseline creatinine likely ranging between 1.2-1.5 -Admission creatinine 2.6, currently improved to 2.1 with hydration   Hypernatremia -Due to dehydration and poor p.o. intake -Continue hypotonic  fluids >>switch to D5W -increase rate on IVF to 100cc/hr   Hypoalbuminemia/anasarca -We will need to hydrate cautiously with IV fluids -She is prone to third space fluid which may lead to pulmonary edema and shortness of breath   Aspiration pneumonia -Noted on CT imaging -Continue Unasyn   Hypothyroidism TSH 42, which has been trending down over the past 3 months from 59 She is continued on levothyroxine, but will change to IV since unable to take p.o. at this time   Type 2 diabetes -Continued on sliding scale insulin     Chronic neuromuscular deficits -Chronic left-sided spastic contractures -Patient has been chronically bedbound   Dementia -Recent notes from PCP comment on mild dementia -Family reports that she is normally awake, alert, able to participate in conversation appropriately -Her daughter also reports that over the past few weeks she has been having decreased p.o. intake, at times refusing to eat or eating very little -Her mood otherwise appeared to be normal/engaged until she became encephalopathic -She had a similar incident last year where she became extremely dehydrated, but then began to eat and her acute issues at that time resolved.  She was admitted to [facility] 06/2021. -We will need to reassess mental status once uremia and other electrolyte abnormalities have been corrected -If she continues to refuse any p.o. intake or does not take in enough p.o. intake, will need to readdress goals of care with patient's family   Goals of care -During her last admission in September, she was seen by palliative care at that time and family had agreed to DNR status -This was reconfirmed with patient's daughter, that DNR is still in place -I  mentioned that it would be helpful if palliative care follow in her hospital course as another layer of support and to help address goals of care>>no PEG   Pressure injuries,  Left sacrum, stage II present on admission Right sacrum,  stage 3, present on admission Right medial buttocks, stage II, present on admission Right heel, deep tissue pressure injury, present on admission Pressure Injury 12/03/21 Sacrum Left;Medial Stage 2 -  Partial thickness loss of dermis presenting as a shallow open injury with a red, pink wound bed without slough. 4cm x 5 cm x 0 cm wound - pink/red wound base with some black spots medially, no draina (Active)  12/03/21 1830  Location: Sacrum  Location Orientation: Left;Medial  Staging: Stage 2 -  Partial thickness loss of dermis presenting as a shallow open injury with a red, pink wound bed without slough.  Wound Description (Comments): 4cm x 5 cm x 0 cm wound - pink/red wound base with some black spots medially, no drainage noted  Present on Admission: Yes     Pressure Injury 12/03/21 Buttocks Right;Medial Stage 2 -  Partial thickness loss of dermis presenting as a shallow open injury with a red, pink wound bed without slough. 1cm x 1 cm x 0 cm, no drainage (Active)  12/03/21 1830  Location: Buttocks  Location Orientation: Right;Medial  Staging: Stage 2 -  Partial thickness loss of dermis presenting as a shallow open injury with a red, pink wound bed without slough.  Wound Description (Comments): 1cm x 1 cm x 0 cm, no drainage  Present on Admission: Yes     Pressure Injury 12/03/21 Heel Right;Medial Deep Tissue Pressure Injury - Purple or maroon localized area of discolored intact skin or blood-filled blister due to damage of underlying soft tissue from pressure and/or shear. 0.5cm x 0.5 cm (Active)  12/03/21 1830  Location: Heel  Location Orientation: Right;Medial  Staging: Deep Tissue Pressure Injury - Purple or maroon localized area of discolored intact skin or blood-filled blister due to damage of underlying soft tissue from pressure and/or shear.  Wound Description (Comments): 0.5cm x 0.5 cm  Present on Admission: Yes  Seen by Platter, appreciate input           DVT prophylaxis:  heparin injection 5,000 Units Start: 12/03/21 2200 SCDs Start: 12/03/21 1829 Place TED hose Start: 12/03/21 1829   Code Status: DNR, confirmed with patient's daughter Family Communication: Updated patient's daughter over the phone 2/21 Disposition Plan: Status is: Inpatient Remains inpatient appropriate because: Continued encephalopathy, renal failure and electrolyte derangements         Consultants:  Palliative care   Procedures:  PICC line 2/20   Antimicrobials:  Unasyn                     Subjective: Pt is awake.  A&O x 1.  Does not follow commands.  Denies cp, sob.  Remainder ROS not possible due to AMS  Objective: Vitals:   12/06/21 1437 12/06/21 2054 12/07/21 0544 12/07/21 1400  BP: 140/78 126/74 131/77 110/79  Pulse: 88 99 (!) 102 (!) 108  Resp: 18 18 19 20   Temp: 98.7 F (37.1 C) 98.2 F (36.8 C) (!) 97.4 F (36.3 C) 98.2 F (36.8 C)  TempSrc: Oral   Oral  SpO2: 94% 100%  100%  Weight:      Height:        Intake/Output Summary (Last 24 hours) at 12/07/2021 1801 Last data filed at 12/07/2021 0800 Gross per  24 hour  Intake 542.49 ml  Output --  Net 542.49 ml   Weight change:  Exam:  General:  Pt is alert, follows commands appropriately, not in acute distress HEENT: No icterus, No thrush, No neck mass, Ross/AT Cardiovascular: RRR, S1/S2, no rubs, no gallops Respiratory: bibasilar crackles.  No wheeze Abdomen: Soft/+BS, non tender, non distended, no guarding Extremities: trace LE edema, No lymphangitis, No petechiae, No rashes, no synovitis   Data Reviewed: I have personally reviewed following labs and imaging studies Basic Metabolic Panel: Recent Labs  Lab 12/04/21 1319 12/05/21 0513 12/05/21 0918 12/06/21 0346 12/06/21 0848 12/07/21 0623  NA 161* 150*  --  140 147* 148*  K 4.6 3.4*  --  3.8 3.7 3.9  CL 129* 123*  --  116* 120* 119*  CO2 19* 19*  --  18* 20* 19*  GLUCOSE 218* 203* 159* 500* 151* 88  BUN 108* 88*  --  67*  67* 63*  CREATININE 2.47* 2.20*  --  2.04* 2.16* 2.21*  CALCIUM 10.4* 10.2  --  9.4 10.0 9.2  MG  --   --   --  2.0  --  1.9  PHOS  --   --   --  1.5* 1.6* 5.0*   Liver Function Tests: Recent Labs  Lab 12/03/21 1353 12/04/21 1319 12/05/21 0513 12/06/21 0346 12/07/21 0623  AST 21 18 11* 11* 111*  ALT 17 17 14 15  109*  ALKPHOS 72 74 66 68 104  BILITOT 0.7 0.4 0.5 0.3 0.4  PROT 6.5 6.8 5.8* 5.3* 5.9*  ALBUMIN 3.2* 2.7* 2.3* 2.0* 2.1*   No results for input(s): LIPASE, AMYLASE in the last 168 hours. No results for input(s): AMMONIA in the last 168 hours. Coagulation Profile: No results for input(s): INR, PROTIME in the last 168 hours. CBC: Recent Labs  Lab 12/03/21 1353 12/04/21 0423 12/05/21 0513 12/06/21 0346 12/07/21 0623  WBC 5.8 5.4 7.5 11.5* 15.1*  NEUTROABS 4.1  --   --   --   --   HGB 12.6 9.4* 8.5* 7.9* 8.3*  HCT 36.3 32.1* 29.4* 27.7* 28.3*  MCV 96.5 92.0 88.8 90.5 92.5  PLT 180 209 185 163 148*   Cardiac Enzymes: No results for input(s): CKTOTAL, CKMB, CKMBINDEX, TROPONINI in the last 168 hours. BNP: Invalid input(s): POCBNP CBG: Recent Labs  Lab 12/06/21 1622 12/07/21 0748 12/07/21 0754 12/07/21 1210 12/07/21 1744  GLUCAP 171* 38* 86 134* 74   HbA1C: No results for input(s): HGBA1C in the last 72 hours. Urine analysis:    Component Value Date/Time   COLORURINE YELLOW 12/03/2021 1337   APPEARANCEUR CLEAR 12/03/2021 1337   LABSPEC 1.019 12/03/2021 1337   PHURINE 5.0 12/03/2021 1337   GLUCOSEU NEGATIVE 12/03/2021 1337   HGBUR NEGATIVE 12/03/2021 1337   BILIRUBINUR NEGATIVE 12/03/2021 1337   KETONESUR NEGATIVE 12/03/2021 1337   PROTEINUR 30 (A) 12/03/2021 1337   NITRITE NEGATIVE 12/03/2021 1337   LEUKOCYTESUR NEGATIVE 12/03/2021 1337   Sepsis Labs: @LABRCNTIP (procalcitonin:4,lacticidven:4) ) Recent Results (from the past 240 hour(s))  Urine Culture     Status: None   Collection Time: 12/03/21  1:37 PM   Specimen: In/Out Cath Urine   Result Value Ref Range Status   Specimen Description   Final    IN/OUT CATH URINE Performed at [facility], [address], [place], [place] [address]    Special Requests   Final    Normal Performed at [facility], [address] [place], [place] [address]  Culture   Final    NO GROWTH Performed at [facility], [place] [address], [place], [place] [address]    Report Status 12/05/2021 FINAL  Final  Resp Panel by RT-PCR (Flu A&B, Covid) Nasopharyngeal Swab     Status: None   Collection Time: 12/03/21  5:39 PM   Specimen: Nasopharyngeal Swab; Nasopharyngeal(NP) swabs in vial transport medium  Result Value Ref Range Status   SARS Coronavirus 2 by RT PCR NEGATIVE NEGATIVE Final    Comment: (NOTE) SARS-CoV-2 target nucleic acids are NOT DETECTED.  The SARS-CoV-2 RNA is generally detectable in upper respiratory specimens during the acute phase of infection. The lowest concentration of SARS-CoV-2 viral copies this assay can detect is 138 copies/mL. A negative result does not preclude SARS-Cov-2 infection and should not be used as the sole basis for treatment or other patient management decisions. A negative result may occur with  improper specimen collection/handling, submission of specimen other than nasopharyngeal swab, presence of viral mutation(s) within the areas targeted by this assay, and inadequate number of viral copies(<138 copies/mL). A negative result must be combined with clinical observations, patient history, and epidemiological information. The expected result is Negative.  Fact Sheet for Patients:  EntrepreneurPulse.com.au  Fact Sheet for Healthcare Providers:  IncredibleEmployment.be  This test is no t yet approved or cleared by the [place] FDA and  has been authorized for detection and/or diagnosis of SARS-CoV-2 by FDA under an Emergency Use Authorization (EUA). This EUA will remain  in effect (meaning this test  can be used) for the duration of the COVID-19 declaration under Section 564(b)(1) of the Act, 21 U.S.C.section 360bbb-3(b)(1), unless the authorization is terminated  or revoked sooner.       Influenza A by PCR NEGATIVE NEGATIVE Final   Influenza B by PCR NEGATIVE NEGATIVE Final    Comment: (NOTE) The Xpert Xpress SARS-CoV-2/FLU/RSV plus assay is intended as an aid in the diagnosis of influenza from Nasopharyngeal swab specimens and should not be used as a sole basis for treatment. Nasal washings and aspirates are unacceptable for Xpert Xpress SARS-CoV-2/FLU/RSV testing.  Fact Sheet for Patients: EntrepreneurPulse.com.au  Fact Sheet for Healthcare Providers: IncredibleEmployment.be  This test is not yet approved or cleared by the [place] FDA and has been authorized for detection and/or diagnosis of SARS-CoV-2 by FDA under an Emergency Use Authorization (EUA). This EUA will remain in effect (meaning this test can be used) for the duration of the COVID-19 declaration under Section 564(b)(1) of the Act, 21 U.S.C. section 360bbb-3(b)(1), unless the authorization is terminated or revoked.  Performed at [facility], [address] [place], [place] [address]   Culture, blood (Routine X 2) w Reflex to ID Panel     Status: Abnormal   Collection Time: 12/03/21  6:53 PM   Specimen: BLOOD RIGHT ARM  Result Value Ref Range Status   Specimen Description   Final    BLOOD RIGHT ARM BOTTLES DRAWN AEROBIC ONLY Performed at [facility], [address] [place], [place] [address]    Special Requests   Final    Blood Culture results may not be optimal due to an inadequate volume of blood received in culture bottles Performed at [address] [place], [place] [address]    Culture  Setup Time   Final    Gram Stain Report Called to,Read Back By and Verified With: VALDESE DILDY @ 0954 ON 12/06/21 C VARNER CORRECTED RESULTS GRAM POSITIVE  RODS PREVIOUSLY REPORTED AS:  GRAM POSITIVE COCCI CORRECTED RESULTS CALLED TO: PHARMD G.COFFEE AT 6314 ON 12/07/2021 BY T.SAAD.    Culture (A)  Final    DIPHTHEROIDS(CORYNEBACTERIUM SPECIES) Standardized susceptibility testing for this organism is not available. Performed at [facility], [place] [address] [place], [place] [address]    Report Status 12/07/2021 FINAL  Final  Culture, blood (Routine X 2) w Reflex to ID Panel     Status: None (Preliminary result)   Collection Time: 12/03/21  6:54 PM   Specimen: BLOOD RIGHT WRIST  Result Value Ref Range Status   Specimen Description BLOOD RIGHT WRIST BOTTLES DRAWN AEROBIC ONLY  Final   Special Requests   Final    Blood Culture results may not be optimal due to an inadequate volume of blood received in culture bottles   Culture   Final    NO GROWTH 4 DAYS Performed at [facility], [address], [place], [place] [address]    Report Status PENDING  Incomplete     Scheduled Meds:  aspirin  300 mg Rectal Daily   Chlorhexidine Gluconate Cloth  6 each Topical Daily   feeding supplement  237 mL Oral BID BM   heparin  5,000 Units Subcutaneous Q8H   insulin aspart  0-6 Units Subcutaneous TID WC   levothyroxine  100 mcg Intravenous Daily   pantoprazole (PROTONIX) IV  40 mg Intravenous Q24H   sodium chloride flush  10-40 mL Intracatheter Q12H   sodium chloride flush  3 mL Intravenous Q12H   sodium chloride flush  3 mL Intravenous Q12H   thiamine injection  100 mg Intravenous Daily   Continuous Infusions:  sodium chloride     ampicillin-sulbactam (UNASYN) IV 3 g (12/07/21 1037)   dextrose      Procedures/Studies: CT ANGIO HEAD NECK W WO CM  Result Date: 12/04/2021 CLINICAL DATA:  71 year old female with neurologic deficit, altered mental status. EXAM: CT ANGIOGRAPHY HEAD AND NECK TECHNIQUE: Multidetector CT imaging of the head and neck was performed using the standard protocol during bolus administration of intravenous contrast.  Multiplanar CT image reconstructions and MIPs were obtained to evaluate the vascular anatomy. Carotid stenosis measurements (when applicable) are obtained utilizing NASCET criteria, using the distal internal carotid diameter as the denominator. RADIATION DOSE REDUCTION: This exam was performed according to the departmental dose-optimization program which includes automated exposure control, adjustment of the mA and/or kV according to patient size and/or use of iterative reconstruction technique. CONTRAST:  35mL OMNIPAQUE IOHEXOL 350 MG/ML SOLN COMPARISON:  Head CT 12/03/2021. Brain MRI 07/08/2021. Carotid ultrasound 04/20/2021. Chest CT 05/20/2021. FINDINGS: CT HEAD Brain: Scaphocephaly. Stable non contrast CT appearance of the brain. Patchy and confluent bilateral cerebral white matter hypodensity. Small area of cortical encephalomalacia in the right superior frontal gyrus. Calvarium and skull base: No acute osseous abnormality identified. Paranasal sinuses: Visualized paranasal sinuses and mastoids are clear. Orbits: No acute orbit or scalp soft tissue finding. CTA NECK Skeleton: Previous C4-C5 and C5-C6 ACDF with solid arthrodesis. Superimposed cervical spine degeneration. No acute osseous abnormality identified. Upper chest: Opacified bronchus intermedius with patchy and nodular peribronchial opacity in the posterior right upper lobe and throughout the visible superior segment of the right lower lobe. Some upper lobe airway thickening and/or opacification. Negative left lung. Visible central pulmonary arteries appear intact. No superior mediastinal lymphadenopathy. Other neck: No acute neck soft tissue finding. Aortic arch: Tortuous aortic arch. Calcified aortic atherosclerosis. 3 vessel arch configuration. Right carotid system: Tortuous brachiocephalic artery and proximal right CCA  without plaque or stenosis. Negative right carotid bifurcation. Tortuous right ICA distal to the bulb but no stenosis. Left carotid  system: Negative aside from tortuosity. Vertebral arteries: Tortuous proximal right subclavian artery with mild calcified plaque and no stenosis. Normal right vertebral artery origin. Tortuous right vertebral artery appears somewhat dominant and is patent to the skull base without stenosis. Left vertebral artery negative aside from tortuosity, mildly non dominant. CTA HEAD Posterior circulation: Dominant right vertebral V4 segment is tortuous with calcified atherosclerosis but no stenosis. Negative left V4 segment. Normal PICA origins. Tortuous vertebrobasilar junction and basilar artery with minimal calcified plaque and no stenosis. Patent SCA and PCA origins. Posterior communicating arteries are diminutive or absent. Bilateral PCA branches are normal aside from tortuosity. Anterior circulation: Both ICA siphons are patent. Mild siphon tortuosity. Mild calcified atherosclerosis and irregularity of the cavernous and supraclinoid segments but no significant stenosis. Patent carotid termini. Normal MCA and ACA origins. Mildly tortuous A1 segments. Normal anterior communicating artery. Bilateral ACA branches are within normal limits. Left MCA M1 segment bifurcates early without stenosis. Right MCA M1 and bifurcation are patent without stenosis. Bilateral MCA branches appear normal aside from tortuosity. Venous sinuses: Early contrast timing but grossly patent. Anatomic variants: Dominant right vertebral artery. Review of the MIP images confirms the above findings IMPRESSION: 1. Negative for large vessel occlusion. Pronounced generalized arterial tortuosity in the head and neck. But mild for age atherosclerosis and no significant stenosis. 2. Abnormal Right Lung, with opacified bronchus intermedius, abnormal right upper and lower lobe peribronchial opacity. Constellation favors favor Multilobar Pneumonia Secondary To Aspiration. Follow-up Chest Radiographs may be valuable. 3. Stable CT appearance of the brain. Previous  C4-C5 and C5-C6 ACDF with solid arthrodesis. 4. Aortic Atherosclerosis (ICD10-I70.0). Electronically Signed   By: Genevie Ann M.D.   On: 12/04/2021 08:57   DG Chest 1 View  Result Date: 12/03/2021 CLINICAL DATA:  Altered mental status EXAM: CHEST  1 VIEW COMPARISON:  07/09/2021 FINDINGS: The heart size and mediastinal contours are within normal limits. Both lungs are clear. The visualized skeletal structures are unremarkable. IMPRESSION: No acute abnormality of the lungs in AP portable projection. Electronically Signed   By: Delanna Ahmadi M.D.   On: 12/03/2021 14:05   CT HEAD WO CONTRAST  Result Date: 12/03/2021 CLINICAL DATA:  Mental status changes. EXAM: CT HEAD WITHOUT CONTRAST TECHNIQUE: Contiguous axial images were obtained from the base of the skull through the vertex without intravenous contrast. RADIATION DOSE REDUCTION: This exam was performed according to the departmental dose-optimization program which includes automated exposure control, adjustment of the mA and/or kV according to patient size and/or use of iterative reconstruction technique. COMPARISON:  07/07/2021 FINDINGS: Brain: There is no evidence for acute hemorrhage, hydrocephalus, mass lesion, or abnormal extra-axial fluid collection. No definite CT evidence for acute infarction. Patchy low attenuation in the deep hemispheric and periventricular white matter is nonspecific, but likely reflects chronic microvascular ischemic demyelination. Vascular: No hyperdense vessel or unexpected calcification. Skull: No evidence for fracture. No worrisome lytic or sclerotic lesion. Sinuses/Orbits: The visualized paranasal sinuses and mastoid air cells are clear. Visualized portions of the globes and intraorbital fat are unremarkable. Other: None. IMPRESSION: 1. No acute intracranial abnormality. 2. Chronic small vessel ischemic disease. Electronically Signed   By: Misty Stanley M.D.   On: 12/03/2021 14:03   US RENAL  Result Date: 12/05/2021 CLINICAL  DATA:  AKI EXAM: RENAL / URINARY TRACT ULTRASOUND COMPLETE COMPARISON:  Renal ultrasound 07/07/2021, CT abdomen/pelvis 11/07/2019 FINDINGS: Right  Kidney: Renal measurements: 9.3 cm x 4.3 cm x 5.6 cm = volume: 117 mL. Parenchymal echogenicity is increased. There is no hydronephrosis. Left Kidney: Renal measurements: 8.4 cm x 4.5 cm x 4.6 cm = volume: 91 mL. Parenchymal echogenicity is increased. There is no hydronephrosis. Bladder: Appears normal for degree of bladder distention. Other: None. IMPRESSION: Increased parenchymal echogenicity bilaterally consistent with medical renal disease. No hydronephrosis. Electronically Signed   By: Valetta Mole M.D.   On: 12/05/2021 11:32   [place] EKG SITE RITE  Result Date: 12/05/2021 If [facility] image not attached, placement could not be confirmed due to current cardiac rhythm.   Orson Eva, DO  [facility]  If 7PM-7AM, please contact night-coverage [email or web address] Password [email or web address] 12/07/2021, 6:01 PM   LOS: 3 days

## 2021-12-07 NOTE — Assessment & Plan Note (Deleted)
You suck

## 2021-12-07 NOTE — Progress Notes (Signed)
MD Zierle-Ghosh notified that patient had a large sized black, tarry stool. Will continue to monitor.

## 2021-12-07 NOTE — Assessment & Plan Note (Deleted)
You're awesom

## 2021-12-07 NOTE — Consult Note (Signed)
Palliative Care Consult Note                                  Date: 12/07/2021   Patient Name: Joanna [name]  DOB: 08/22/1951  MRN: [id number]  Age / Sex: 71 y.o., female  PCP: [name], MD Referring Physician: Orson Eva, MD  Reason for Consultation: Establishing goals of care  HPI/Patient Profile: 71 y.o. female  with past medical history of hypertension, diabetes, chronic kidney disease stage IIIb, mild dementia.  She was brought to the hospital due to acute/progressive encephalopathy and noted to be dehydrated with AKI and hypernatremia.  She was admitted on 12/03/2021 with acute metabolic encephalopathy, AKI on CKD 3B, hypernatremia, hypoalbuminemia, aspiration pneumonia, dementia.  PMT was consulted for goals of care conversations.  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 [facility]   Bilateral headaches    Depression    Diabetes mellitus    Diverticulosis    Dyslipidemia    GERD (gastroesophageal reflux disease)    Hypertension    Insomnia    Microproteinuria    Renal insufficiency 2010   Right carpal tunnel syndrome 12/24/2017   Sleep apnea    Sleep apnea 12/25/2017   Abnormal sleep study February 2019 CPAP ordered   Thyroid disease    hypothyroid    Subjective:   This NP Walden Field reviewed medical records, received report from team, assessed the patient and then meet at the patient's bedside to discuss diagnosis, prognosis, GOC, EOL wishes disposition and options.  I met with the patient's daughter via phone. The patient is minimally responsive and unable to meaningfully participate in conversations.   Concept of Palliative Care was introduced as specialized medical care for people and their families living with serious illness.  If focuses on providing relief from the symptoms and stress of a serious illness.  The goal is to improve quality of life for  both the patient and the family. Values and goals of care important to patient and family were attempted to be elicited.  Created space and opportunity for patient  and family to explore thoughts and feelings regarding current medical situation   Natural trajectory and current clinical status were discussed. Questions and concerns addressed. Patient  encouraged to call with questions or concerns.    Patient/Family Understanding of Illness: The patient's daughter understand there is something wrong with her brain (encephalopathy).  Sometimes she eats and she has phases where she will not eat or drink.  She had a similar episode in October.  They previously did not do a feeding tube because she was too weak and frail.  She understands that her mom will never walk again and she is very fragile.  She knows that she cannot go home, although she wants to.  Currently lives in a family owned care home.  We had a substantial discussion about her current acute and chronic illnesses such as dementia and metabolic encephalopathy.  We discussed how her acute mental status changes could be due to dehydration and malnutrition or progression of her dementia.  However, it is most likely multifactorial and a combination of both.  She seems to be in agreement with this.  Life Review: The patient worked for [employer] which became [employer] for multiple years.  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  She likes spending  time with her family and especially her grandkids.  She enjoys online shopping, especially for [employer].  She loves to bake and still bakes everything from scratch.  She visits her family as often as she can.  She loves buying her grandkids stuff.  Patient Values: Independence, family (especially grandchildren)  Goals: No feeding tube  Today's Discussion: In addition to our clinical discussion as described above, we had an in-depth discussion about the  patient, things of importance, her wishes, her goals. In our discussion we highlighted that her independence is very important to her.  Her daughter feels that she has depression due to her loss of independence and is now full care including feeding, changing/dressing, bathing.  She is unable to spend quality time with family which was her primary love.  We discussed the natural trajectory of dementia and things that can be expected as individuals approaching end-of-life.  Her daughter agrees that her quality of life is not what her mother would want.  She was very clear that she would not want a feeding tube for her mother.  She discusses that this would further remove quality from her life.  We agreed that while she is having small improvements in relation to her kidney function that she continues to be minimally responsive and not having any oral intake.  After discussion about options ranging from full and aggressive care to comfort care we decided that I would check on the patient tomorrow and call the daughter with an update.  She will discuss the current situation with her brother.  Depending on how she does tomorrow we may have further discussions about possible comfort care/hospice if she continues to be minimally responsive and not eating/drinking.  She is clear that she does not feel it is appropriate for her mom to bounce back and forth in and out of the hospital.  I provided emotional general support through therapeutic listening, therapeutic silence, empathy, sharing of stories, laughter, and other techniques.  I answered all questions and addressed all concerns to the best of my ability.  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  Constitutional:      General: She is sleeping.     Appearance: She is ill-appearing. She is not toxic-appearing.  HENT:     Head: Normocephalic and atraumatic.  Cardiovascular:     Rate and Rhythm: Normal rate.  Pulmonary:     Effort: Pulmonary effort is normal. No respiratory distress.  Abdominal:     General: Abdomen is flat.     Palpations: Abdomen is soft.  Skin:    General: Skin is warm and dry.  Neurological:     Mental Status: She is lethargic.     Comments: Minimally interactive, does open eyes to repeatedly calling her name    Vital Signs:  BP 110/79    Pulse (!) 108    Temp 98.2 F (36.8 C) (Oral)    Resp 20    Ht '5\' 2"'  (1.575 m)    Wt 48 kg  SpO2 100%    BMI 19.35 kg/m   Palliative Assessment/Data: 10-20%    Advanced Care Planning:   Primary Decision Maker: NEXT OF KIN  Code Status/Advance Care Planning: DNR  A discussion was had today regarding advanced directives. Concepts specific to code status, artifical feeding and hydration, continued IV antibiotics and rehospitalization was had.  The difference between a aggressive medical intervention path and a palliative comfort care path for this patient at this time was had.  Family is clear about not proceeding with a feeding tube.  Decisions/Changes to ACP: None today  Assessment & Plan:   Impression: 71 year old female with recurrent hospital admissions for dehydration and malnutrition.  She has what was previously described as mild dementia.  She is now admitted for acute metabolic encephalopathy likely multifactorial including progression of dementia, dehydration, malnutrition.  Her daughter notes that she is gotten progressively weak and frail.  She has  had a downward decline since July when she fell.  Family is clear that she is not having a quality of life that she would want.  She was very independent and is likely suffering some depression over this loss of independence. We have decided to reassess tomorrow and if no significant improvements then we will have conversations about looking more toward a comfort/hospice approach.  SUMMARY OF RECOMMENDATIONS   Continue to treat the treatable today Remain DNR Continued support of patient and family Reassess patient tomorrow and call daughter with update Further goals of care discussions ongoing based on evolution of clinical picture PMT will continue to follow  Symptom Management:  Per primary team PMT is available to assist as needed  Prognosis:  Unable to determine  Discharge Planning:  To Be Determined   Discussed with: Patient's family, Medical team, nursing team, [facility] team    Thank you for allowing [place] to participate in the care of Chaniah R Memon PMT will continue to support holistically.  Time Total: 90 min  Greater than 50%  of this time was spent counseling and coordinating care related to the above assessment and plan.  Signed by: Walden Field, NP Palliative Medicine Team  Team Phone # [phone] (Nights/Weekends)  12/07/2021, 2:55 PM

## 2021-12-07 NOTE — Progress Notes (Signed)
° ° ° °  Referral received for [name] for goals of care discussion. Chart reviewed and updates received from RN. Patient assessed and is unable to engage appropriately in discussions.   Attempted to contact patient's daughter [name] (primary contact per other providers' notes). Unable to reach. Voicemail left with contact information given.   PMT will re-attempt to contact family at a later time/date. Detailed note and recommendations to follow once GOC has been completed.   Thank you for your referral and allowing PMT to assist in Joanna [name]'s care.   Walden Field, NP Palliative Medicine Team Phone: [phone]  NO CHARGE

## 2021-12-08 DIAGNOSIS — Z515 Encounter for palliative care: Secondary | ICD-10-CM | POA: Diagnosis not present

## 2021-12-08 DIAGNOSIS — G9341 Metabolic encephalopathy: Secondary | ICD-10-CM | POA: Diagnosis not present

## 2021-12-08 DIAGNOSIS — Z66 Do not resuscitate: Secondary | ICD-10-CM | POA: Diagnosis not present

## 2021-12-08 DIAGNOSIS — I10 Essential (primary) hypertension: Secondary | ICD-10-CM | POA: Diagnosis not present

## 2021-12-08 DIAGNOSIS — E876 Hypokalemia: Secondary | ICD-10-CM

## 2021-12-08 DIAGNOSIS — G9340 Encephalopathy, unspecified: Secondary | ICD-10-CM | POA: Diagnosis not present

## 2021-12-08 DIAGNOSIS — E870 Hyperosmolality and hypernatremia: Secondary | ICD-10-CM | POA: Diagnosis not present

## 2021-12-08 DIAGNOSIS — Z7189 Other specified counseling: Secondary | ICD-10-CM | POA: Diagnosis not present

## 2021-12-08 LAB — CULTURE, BLOOD (ROUTINE X 2): Culture: NO GROWTH

## 2021-12-08 LAB — BASIC METABOLIC PANEL
Anion gap: 8 (ref 5–15)
BUN: 61 mg/dL — ABNORMAL HIGH (ref 8–23)
CO2: 19 mmol/L — ABNORMAL LOW (ref 22–32)
Calcium: 8.5 mg/dL — ABNORMAL LOW (ref 8.9–10.3)
Chloride: 117 mmol/L — ABNORMAL HIGH (ref 98–111)
Creatinine, Ser: 2.34 mg/dL — ABNORMAL HIGH (ref 0.44–1.00)
GFR, Estimated: 22 mL/min — ABNORMAL LOW (ref >60)
Glucose, Bld: 130 mg/dL — ABNORMAL HIGH (ref 70–99)
Potassium: 3.3 mmol/L — ABNORMAL LOW (ref 3.5–5.1)
Sodium: 144 mmol/L (ref 135–145)

## 2021-12-08 LAB — HEMOGLOBIN AND HEMATOCRIT, BLOOD
HCT: 21.8 % — ABNORMAL LOW (ref 36.0–46.0)
Hemoglobin: 6.6 g/dL — CL (ref 12.0–15.0)

## 2021-12-08 LAB — HEPATIC FUNCTION PANEL
ALT: 72 U/L — ABNORMAL HIGH (ref 0–44)
AST: 38 U/L (ref 15–41)
Albumin: 1.8 g/dL — ABNORMAL LOW (ref 3.5–5.0)
Alkaline Phosphatase: 97 U/L (ref 38–126)
Bilirubin, Direct: <0.1 mg/dL (ref 0.0–0.2)
Total Bilirubin: 0.6 mg/dL (ref 0.3–1.2)
Total Protein: 5 g/dL — ABNORMAL LOW (ref 6.5–8.1)

## 2021-12-08 LAB — URINALYSIS, ROUTINE W REFLEX MICROSCOPIC
Bilirubin Urine: NEGATIVE
Glucose, UA: NEGATIVE mg/dL
Ketones, ur: NEGATIVE mg/dL
Nitrite: NEGATIVE
Protein, ur: NEGATIVE mg/dL
Specific Gravity, Urine: 1.012 (ref 1.005–1.030)
pH: 6 (ref 5.0–8.0)

## 2021-12-08 LAB — PREPARE RBC (CROSSMATCH)

## 2021-12-08 LAB — CBC
HCT: 22 % — ABNORMAL LOW (ref 36.0–46.0)
Hemoglobin: 6.7 g/dL — CL (ref 12.0–15.0)
MCH: 27.1 pg (ref 26.0–34.0)
MCHC: 30.5 g/dL (ref 30.0–36.0)
MCV: 89.1 fL (ref 80.0–100.0)
Platelets: 117 10*3/uL — ABNORMAL LOW (ref 150–400)
RBC: 2.47 MIL/uL — ABNORMAL LOW (ref 3.87–5.11)
RDW: 17.2 % — ABNORMAL HIGH (ref 11.5–15.5)
WBC: 13 10*3/uL — ABNORMAL HIGH (ref 4.0–10.5)
nRBC: 0 % (ref 0.0–0.2)

## 2021-12-08 LAB — GLUCOSE, CAPILLARY
Glucose-Capillary: 139 mg/dL — ABNORMAL HIGH (ref 70–99)
Glucose-Capillary: 247 mg/dL — ABNORMAL HIGH (ref 70–99)

## 2021-12-08 LAB — OCCULT BLOOD X 1 CARD TO LAB, STOOL: Fecal Occult Bld: POSITIVE — AB

## 2021-12-08 LAB — MAGNESIUM: Magnesium: 1.7 mg/dL (ref 1.7–2.4)

## 2021-12-08 MED ORDER — POTASSIUM CHLORIDE 10 MEQ/100ML IV SOLN
10.0000 meq | INTRAVENOUS | Status: AC
  Administered 2021-12-08 (×3): 10 meq via INTRAVENOUS
  Filled 2021-12-08 (×3): qty 100

## 2021-12-08 MED ORDER — SODIUM CHLORIDE 0.9% IV SOLUTION: Freq: Once | INTRAVENOUS | Status: DC

## 2021-12-08 MED ORDER — PANTOPRAZOLE SODIUM 40 MG IV SOLR
40.0000 mg | Freq: Two times a day (BID) | INTRAVENOUS | Status: DC
  Administered 2021-12-08 – 2021-12-11 (×6): 40 mg via INTRAVENOUS
  Filled 2021-12-08 (×8): qty 10

## 2021-12-08 NOTE — TOC Progression Note (Signed)
Transition of Care [facility]) - Progression Note    Patient Details  Name: Joanna [name] MRN: [id number] Date of Birth: 1950/12/04  Transition of Care [facility]) CM/SW Contact  Ihor Gully, LCSW Phone Number: 12/08/2021, 2:14 PM  Clinical Narrative:    Family has elected for patient to return to [facility] with [facility] Daughter, Mrs. Redmond Pulling, confirms that she wants patient referred to [facility]   Expected Discharge Plan:  [facility]) Barriers to Discharge: Continued Medical Work up  Expected Discharge Plan and Services Expected Discharge Plan:  [facility])                                               Social Determinants of Health (SDOH) Interventions    Readmission Risk Interventions Readmission Risk Prevention Plan 11/01/2021 06/27/2021  Transportation Screening Complete Complete  HRI or Home Care Consult Complete Complete  Social Work Consult for [place] Planning/Counseling Complete Complete  Palliative Care Screening Not Applicable Not Applicable  Medication Review Press photographer) Complete Complete  Some recent data might be hidden

## 2021-12-08 NOTE — Progress Notes (Signed)
Pt has only voided < 100 ml of cloudy yellow urine so far this shift. Lower abd firm, bladder palpable and distended. Bladder scan performed, shows > 367ml x3. MD Tat notified and received order for I&O cath. I&O cath completed, 500 ml of cloudy yellow urine drained. Pt tolerated well, denies c/o. Purewick replaced. PICC line noted to have bleeding at insertion site. SWOT nurse Tamala Fothergill, RN in to evaluate, cleaned and applied new dressing to site. SWOT nurse applied soft pressure dressing to site to see if oozing will subside.

## 2021-12-08 NOTE — Plan of Care (Signed)
°  Problem: Education: Goal: Knowledge of General Education information will improve Description: Including pain rating scale, medication(s)/side effects and non-pharmacologic comfort measures 12/08/2021 0009 by Caroleen Hamman, RN Outcome: Progressing 12/08/2021 0009 by Caroleen Hamman, RN Outcome: Progressing   Problem: Health Behavior/Discharge Planning: Goal: Ability to manage health-related needs will improve 12/08/2021 0009 by Caroleen Hamman, RN Outcome: Progressing 12/08/2021 0009 by Caroleen Hamman, RN Outcome: Progressing   Problem: Clinical Measurements: Goal: Ability to maintain clinical measurements within normal limits will improve 12/08/2021 0009 by Caroleen Hamman, RN Outcome: Progressing 12/08/2021 0009 by Caroleen Hamman, RN Outcome: Progressing Goal: Will remain free from infection 12/08/2021 0009 by Caroleen Hamman, RN Outcome: Progressing 12/08/2021 0009 by Caroleen Hamman, RN Outcome: Progressing Goal: Diagnostic test results will improve 12/08/2021 0009 by Caroleen Hamman, RN Outcome: Progressing 12/08/2021 0009 by Caroleen Hamman, RN Outcome: Progressing Goal: Respiratory complications will improve 12/08/2021 0009 by Caroleen Hamman, RN Outcome: Progressing 12/08/2021 0009 by Caroleen Hamman, RN Outcome: Progressing Goal: Cardiovascular complication will be avoided 12/08/2021 0009 by Caroleen Hamman, RN Outcome: Progressing 12/08/2021 0009 by Caroleen Hamman, RN Outcome: Progressing   Problem: Activity: Goal: Risk for activity intolerance will decrease 12/08/2021 0009 by Caroleen Hamman, RN Outcome: Progressing 12/08/2021 0009 by Caroleen Hamman, RN Outcome: Progressing   Problem: Nutrition: Goal: Adequate nutrition will be maintained 12/08/2021 0009 by Caroleen Hamman, RN Outcome: Progressing 12/08/2021 0009 by Caroleen Hamman, RN Outcome: Progressing   Problem: Coping: Goal: Level of anxiety will decrease 12/08/2021  0009 by Caroleen Hamman, RN Outcome: Progressing 12/08/2021 0009 by Caroleen Hamman, RN Outcome: Progressing   Problem: Elimination: Goal: Will not experience complications related to bowel motility 12/08/2021 0009 by Caroleen Hamman, RN Outcome: Progressing 12/08/2021 0009 by Caroleen Hamman, RN Outcome: Progressing Goal: Will not experience complications related to urinary retention 12/08/2021 0009 by Caroleen Hamman, RN Outcome: Progressing 12/08/2021 0009 by Caroleen Hamman, RN Outcome: Progressing   Problem: Pain Managment: Goal: General experience of comfort will improve 12/08/2021 0009 by Caroleen Hamman, RN Outcome: Progressing 12/08/2021 0009 by Caroleen Hamman, RN Outcome: Progressing   Problem: Safety: Goal: Ability to remain free from injury will improve 12/08/2021 0009 by Caroleen Hamman, RN Outcome: Progressing 12/08/2021 0009 by Caroleen Hamman, RN Outcome: Progressing   Problem: Skin Integrity: Goal: Risk for impaired skin integrity will decrease 12/08/2021 0009 by Caroleen Hamman, RN Outcome: Progressing 12/08/2021 0009 by Caroleen Hamman, RN Outcome: Progressing

## 2021-12-08 NOTE — Progress Notes (Signed)
First unit of PRBC started per order, pt tolerated first 15 minutes without any untoward s/s reaction or complaint. VSS. Prior to beginning transfusion, noted that pt has small subconjunctival hemorrhage in left eye and had developed some petechia on right forearm just distal to PICC line insertion site s/p pressure dsg applied by SWOT nurse due to oozing of blood from PICC insertion site earlier this afternoon. Pressure dressing removed, no further bleeding from PICC site noted. MD Tat notified of above findings.

## 2021-12-08 NOTE — Progress Notes (Signed)
Daily Progress Note   Patient Name: Joanna [name]       Date: 12/08/2021 DOB: [birth year]  Age: 71 y.o. MRN#: [id number] Attending Physician: Orson Eva, MD Primary Care Physician: [name], MD Admit Date: 12/03/2021 Length of Stay: 4 days  Reason for Consultation/Follow-up: Establishing goals of care  HPI/Patient Profile:  71 y.o. female  with past medical history of hypertension, diabetes, chronic kidney disease stage IIIb, mild dementia.  She was brought to the hospital due to acute/progressive encephalopathy and noted to be dehydrated with AKI and hypernatremia.  She was admitted on 12/03/2021 with acute metabolic encephalopathy, AKI on CKD 3B, hypernatremia, hypoalbuminemia, aspiration pneumonia, dementia.   PMT was consulted for goals of care conversations.  Subjective:   Subjective: Chart Reviewed. Updates received. Patient Assessed. Created space and opportunity for patient  and family to explore thoughts and feelings regarding current medical situation.  Today's Discussion: I saw the patient at bedside, she seems more alert, still only minimally answering questions. Does answer "no" when asked if any pain. I later spoke with her daughter. She states her family has all met and agreed that she has a poor quality of life compared to how she would want to live. They have elected a more comfort focused approach and accepted consult to hospice for "home" hospice at her family care home. We had a good discussion on hospice philosophy and end of life care. She has requested that hospice notify her when "she's near the end of her time" so that she may come down from [place] to visit.   I provided emotional and general support through therapeutic listening, empathy, and other techniques. I answered all questions and addressed all concerns to the best of my ability.  ROS Limited due to mental status changes Review of Systems  Constitutional:        More alert today, but still with short,  one-word answers Denies pain in general   Objective:   Vital Signs:  BP 128/68 (BP Location: Left Leg)    Pulse (!) 109    Temp 98.7 F (37.1 C) (Oral)    Resp 18    Ht _0  (1.575 m)    Wt 48 kg    SpO2 100%    BMI 19.35 kg/m   Physical Exam: Physical Exam Vitals and nursing note reviewed.  Constitutional:      Appearance: She is ill-appearing. She is not toxic-appearing.  HENT:     Head: Normocephalic and atraumatic.  Cardiovascular:     Rate and Rhythm: Normal rate.  Pulmonary:     Effort: Pulmonary effort is normal. No respiratory distress.  Abdominal:     General: Abdomen is flat.     Palpations: Abdomen is soft.     Comments: Has eaten 25-50% of breakfast  Skin:    General: Skin is warm and dry.  Psychiatric:        Mood and Affect: Mood normal.        Behavior: Behavior normal.    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  She has what was previously described as mild dementia.  She is now admitted for acute metabolic encephalopathy likely multifactorial including progression of dementia, dehydration, malnutrition.  Her daughter notes that she is gotten progressively weak and frail.  She has had a downward decline since July when she fell.  Family is clear that she is not having a quality of life that she would want.  She was very independent and is likely suffering some depression over this loss of independence.  Given no significant improvement  and acceptance that the patient has a poor quality of life for what she would want, family has elected to d/c home to the care home with hospice services support. I have subsequently consulted [facility].  SUMMARY OF RECOMMENDATIONS   Remain DNR TOC consult for hospice services PMT will follow peripherally for any new needs Please contact us if new needs identified  Symptom Management:  Per primary team PMT is available to assist as needed No overt symptoms identified at this time.  Code Status: DNR  Prognosis: < 6 months  Discharge Planning: [place] with Hospice  Discussed with: Medical team, nursing team, [facility] team, patient, patient's family  Thank you for allowing [place] to participate in the care of Franceen R Shiflett PMT will continue to support holistically.  Billing based on MDM: High  Problems Addressed: One acute or chronic illness or injury that poses a threat to life or bodily function  Amount and/or Complexity of Data: Category 3:Discussion of management or test interpretation with external physician/other qualified health care professional/appropriate source (not separately reported)  Risks: N/A   Walden Field, NP Palliative Medicine Team  Team Phone # [phone] (Nights/Weekends)  06/14/2021, 8:17 AM

## 2021-12-08 NOTE — Progress Notes (Signed)
Came into patient's room to second verify blood. Told nurse that I would wait the 15 post transfusion to monitor patient. After 15 minutes the patient's respirations, blood pressure, and heart rate increased. I also noticed that her nose was bleeding. I notified the MD of the change in vital signs, nose bleed, and the fact that her urine has a dark color to it. At this time the blood transfusion has been stopped.

## 2021-12-08 NOTE — Progress Notes (Signed)
SLP Cancellation Note  Patient Details Name: Joanna [name] MRN: [id number] DOB: 09-01-1951   Cancelled treatment:       Reason Eval/Treat Not Completed: Patient at procedure or test/unavailable; Nursing replacing PICC line, but reported that she ate a "good breakfast" this AM. She is sleeping soundly and did not eat lunch. Palliative care notes reviewed and current diet continues to be appropriate to allow po when Pt is alert and desires it. Her intake is expected to fluctuate given her current mental status. Continue diet as ordered. SLP will sign off. Reconsult should needs arise.  Thank you,  Genene Churn, [place]    [place] 12/08/2021, 3:10 PM

## 2021-12-08 NOTE — Progress Notes (Addendum)
PROGRESS NOTE  Joanna [name] YTK:[id number] DOB: September 12, 1951 DOA: 12/03/2021 PCP: [name], MD  Brief History:  71 year old female who is a resident of a family care home, with history of hypertension, diabetes, chronic kidney disease stage IIIb, mild dementia, brought to the hospital with progressive encephalopathy.  Noted to be dehydrated with acute kidney injury, hypernatremia.  Imaging also indicated aspiration pneumonia.  She was started on IV unasyn.   She is on IV fluids for rehydration She was started on 1/2NS for hypernatremia.  This was changed to D5W when pt had hypoglycemia.  She was recently hospitalized from 10/31/21 to 11/02/21 due to ABLA and FOBT+.  During that hospitalization she underwent EGD which showed Two non-bleeding cratered gastric ulcers with a clean ulcer base (Forrest Class III) were found in the gastric antrum.   no presence of hematin or active bleeding was present.  She was transfused 2 units PRBC. Palliative medicine was consulted to assist.     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)   Hypokalemia   Dehydration   AKI (acute kidney injury) ([place])   Hypernatremia   Stage 3b chronic kidney disease (HCC)   Spastic hemiparesis of left nondominant side (HCC)   Acute encephalopathy   Pressure injury of skin     Acute metabolic encephalopathy -multifactorial including renal failure and hypernatremia due to dehydration and aspiration pneumonia -CT head and CTA head and neck did not show any acute findings -continue with IV fluids and monitor electrolytes/urine output -Since admission, appears to be making very slow improvements -2/23--A&O x 1; answers simple yes/no questions; not following commands   Acute kidney injury on chronic  kidney disease stage IIIb -Related to dehydration from decreased p.o. intake -Continue IV fluids  -Renal ultrasound without evidence of hydronephrosis -I suspect the initial creatinine value of 0.6 on admission was an erroneous lab result -Baseline creatinine likely ranging between 1.2-1.5 -Admission creatinine 2.6  Acute on chronic anemia -transfuse 2 units PRBC -discussed with daughter>>does not want any further procedures -continue PPI bid   Hypernatremia -Due to dehydration and poor p.o. intake -Continue hypotonic fluids >>switch to D5W -increase rate on IVF to 100cc/hr   Hypoalbuminemia/anasarca -We will need to hydrate cautiously with IV fluids -She is prone to third space fluid which may lead to pulmonary edema and shortness of breath   Aspiration pneumonia -Noted on CT imaging -Continue Unasyn D#4   Hypothyroidism TSH 42, which has been trending down over the past 3 months from 59 She is continued on levothyroxine, but will change to IV since unable to take p.o. at this time   Type 2 diabetes -Continued on sliding scale insulin   Hypokalemia -replete -mag 1.7   Chronic neuromuscular deficits -Chronic left-sided spastic contractures -Patient has been chronically bedbound   Major neurocognitive Disorder -Family reports that she is normally awake, alert, able to participate in conversation appropriately -Her daughter also reports that over the past few weeks she has been having decreased p.o. intake, at times refusing to eat or eating very little -Her mood otherwise appeared to be normal/engaged until she became encephalopathic -She had a similar incident last year where she became extremely dehydrated, but then began to eat and her acute issues at that time resolved.  She was admitted to [facility] 06/2021. -We will need to reassess mental status once uremia and other electrolyte abnormalities have been corrected -If she continues to refuse any p.o. intake or does  not take in enough p.o. intake, will need to readdress goals of care with patient's family -po intake remains poor -Palliative medicine re-consulted   Goals of care -During her last admission in September, she was seen by palliative care at that time and family had agreed to DNR status -This was reconfirmed with patient's daughter, that DNR is still in place -I mentioned that it would be helpful if palliative care follow in her hospital course as another layer of support and to help address goals of care>>no PEG   Pressure injuries,  Left sacrum, stage II present on admission Right sacrum, stage 3, present on admission Right medial buttocks, stage II, present on admission Right heel, deep tissue pressure injury, present on admission Pressure Injury 12/03/21 Sacrum Left;Medial Stage 2 -  Partial thickness loss of dermis presenting as a shallow open injury with a red, pink wound bed without slough. 4cm x 5 cm x 0 cm wound - pink/red wound base with some black spots medially, no draina (Active)  12/03/21 1830  Location: Sacrum  Location Orientation: Left;Medial  Staging: Stage 2 -  Partial thickness loss of dermis presenting as a shallow open injury with a red, pink wound bed without slough.  Wound Description (Comments): 4cm x 5 cm x 0 cm wound - pink/red wound base with some black spots medially, no drainage noted  Present on Admission: Yes     Pressure Injury 12/03/21 Buttocks Right;Medial Stage 2 -  Partial thickness loss of dermis presenting as a shallow open injury with a red, pink wound bed without slough. 1cm x 1 cm x 0 cm, no drainage (Active)  12/03/21 1830  Location: Buttocks  Location Orientation: Right;Medial  Staging: Stage 2 -  Partial thickness loss of dermis presenting as a shallow open injury with a red, pink wound bed without slough.  Wound Description (Comments): 1cm x 1 cm x 0 cm, no drainage  Present on Admission: Yes     Pressure Injury 12/03/21 Heel Right;Medial  Deep Tissue Pressure Injury - Purple or maroon localized area of discolored intact skin or blood-filled blister due to damage of underlying soft tissue from pressure and/or shear. 0.5cm x 0.5 cm (Active)  12/03/21 1830  Location: Heel  Location Orientation: Right;Medial  Staging: Deep Tissue Pressure Injury - Purple or maroon localized area of discolored intact skin or blood-filled blister due to damage of underlying soft tissue from pressure and/or shear.  Wound Description (Comments): 0.5cm x 0.5 cm  Present on Admission: Yes  Seen by [place], appreciate input           DVT prophylaxis: heparin injection 5,000 Units Start: 12/03/21 2200 SCDs Start: 12/03/21 1829 Place TED hose Start: 12/03/21 1829   Code Status: DNR, confirmed with patient's daughter Family Communication: Updated patient's daughter over the phone 2/23 Disposition Plan: Status is: Inpatient Remains inpatient appropriate because: Continued encephalopathy, renal failure and electrolyte derangements         Consultants:  Palliative care   Procedures:  PICC line 2/20   Antimicrobials:  Unasyn 2/2#>>>            Subjective: Patient is awake and able to answer simple yes/no questions.  Denies cp, sob, abd pain.  Does not follow commands  Objective: Vitals:   12/07/21 0544 12/07/21 1400 12/07/21  2128 12/08/21 0404  BP: 131/77 110/79 (!) 157/81 128/68  Pulse: (!) 102 (!) 108 (!) 106 (!) 109  Resp: 19 20 18 18   Temp: (!) 97.4 F (36.3 C) 98.2 F (36.8 C) 98 F (36.7 C) 98.7 F (37.1 C)  TempSrc:  Oral Oral Oral  SpO2:  100% 100% 100%  Weight:      Height:        Intake/Output Summary (Last 24 hours) at 12/08/2021 1207 Last data filed at 12/08/2021 1015 Gross per 24 hour  Intake 1375.16 ml  Output --  Net 1375.16 ml   Weight change:  Exam:  General:  Pt is alert, follows commands appropriately, not in acute distress HEENT: No icterus, No thrush, No neck mass, [place]/AT Cardiovascular: RRR, S1/S2,  no rubs, no gallops Respiratory: bibasilar rales. No wheeze Abdomen: Soft/+BS, non tender, non distended, no guarding Extremities: Nonpitting LE edema, No lymphangitis, No petechiae, No rashes, no synovitis   Data Reviewed: I have personally reviewed following labs and imaging studies Basic Metabolic Panel: Recent Labs  Lab 12/05/21 0513 12/05/21 0918 12/06/21 0346 12/06/21 0848 12/07/21 0623 12/08/21 0452  NA 150*  --  140 147* 148* 144  K 3.4*  --  3.8 3.7 3.9 3.3*  CL 123*  --  116* 120* 119* 117*  CO2 19*  --  18* 20* 19* 19*  GLUCOSE 203* 159* 500* 151* 88 130*  BUN 88*  --  67* 67* 63* 61*  CREATININE 2.20*  --  2.04* 2.16* 2.21* 2.34*  CALCIUM 10.2  --  9.4 10.0 9.2 8.5*  MG  --   --  2.0  --  1.9 1.7  PHOS  --   --  1.5* 1.6* 5.0*  --    Liver Function Tests: Recent Labs  Lab 12/04/21 1319 12/05/21 0513 12/06/21 0346 12/07/21 0623 12/08/21 0452  AST 18 11* 11* 111* 38  ALT 17 14 15  109* 72*  ALKPHOS 74 66 68 104 97  BILITOT 0.4 0.5 0.3 0.4 0.6  PROT 6.8 5.8* 5.3* 5.9* 5.0*  ALBUMIN 2.7* 2.3* 2.0* 2.1* 1.8*   No results for input(s): LIPASE, AMYLASE in the last 168 hours. No results for input(s): AMMONIA in the last 168 hours. Coagulation Profile: No results for input(s): INR, PROTIME in the last 168 hours. CBC: Recent Labs  Lab 12/03/21 1353 12/04/21 0423 12/05/21 0513 12/06/21 0346 12/07/21 0623 12/08/21 0452 12/08/21 0725  WBC 5.8 5.4 7.5 11.5* 15.1* 13.0*  --   NEUTROABS 4.1  --   --   --   --   --   --   HGB 12.6 9.4* 8.5* 7.9* 8.3* 6.7* 6.6*  HCT 36.3 32.1* 29.4* 27.7* 28.3* 22.0* 21.8*  MCV 96.5 92.0 88.8 90.5 92.5 89.1  --   PLT 180 209 185 163 148* 117*  --    Cardiac Enzymes: No results for input(s): CKTOTAL, CKMB, CKMBINDEX, TROPONINI in the last 168 hours. BNP: Invalid input(s): POCBNP CBG: Recent Labs  Lab 12/07/21 0754 12/07/21 1210 12/07/21 1744 12/08/21 0032 12/08/21 0952  GLUCAP 86 134* 74 247* 139*   HbA1C: No  results for input(s): HGBA1C in the last 72 hours. Urine analysis:    Component Value Date/Time   COLORURINE YELLOW 12/03/2021 1337   APPEARANCEUR CLEAR 12/03/2021 1337   LABSPEC 1.019 12/03/2021 1337   PHURINE 5.0 12/03/2021 1337   GLUCOSEU NEGATIVE 12/03/2021 1337   HGBUR NEGATIVE 12/03/2021 [place] 12/03/2021 [place] 12/03/2021  [place] (A) 12/03/2021 1337   NITRITE NEGATIVE 12/03/2021 1337   LEUKOCYTESUR NEGATIVE 12/03/2021 1337   Sepsis Labs: @LABRCNTIP (procalcitonin:4,lacticidven:4) ) Recent Results (from the past 240 hour(s))  Urine Culture     Status: None   Collection Time: 12/03/21  1:37 PM   Specimen: In/Out Cath Urine  Result Value Ref Range Status   Specimen Description   Final    IN/OUT CATH URINE Performed at [facility], [address] [place], [place] [address]    Special Requests   Final    Normal Performed at [facility], [address], [place], [place] [address]    Culture   Final    NO GROWTH Performed at [facility], [place] [address] [place], [place] [address]    Report Status 12/05/2021 FINAL  Final  Resp Panel by RT-PCR (Flu A&B, Covid) Nasopharyngeal Swab     Status: None   Collection Time: 12/03/21  5:39 PM   Specimen: Nasopharyngeal Swab; Nasopharyngeal(NP) swabs in vial transport medium  Result Value Ref Range Status   SARS Coronavirus 2 by RT PCR NEGATIVE NEGATIVE Final    Comment: (NOTE) SARS-CoV-2 target nucleic acids are NOT DETECTED.  The SARS-CoV-2 RNA is generally detectable in upper respiratory specimens during the acute phase of infection. The lowest concentration of SARS-CoV-2 viral copies this assay can detect is 138 copies/mL. A negative result does not preclude SARS-Cov-2 infection and should not be used as the sole basis for treatment or other patient management decisions. A negative result may occur with  improper specimen collection/handling, submission of specimen  other than nasopharyngeal swab, presence of viral mutation(s) within the areas targeted by this assay, and inadequate number of viral copies(<138 copies/mL). A negative result must be combined with clinical observations, patient history, and epidemiological information. The expected result is Negative.  Fact Sheet for Patients:  EntrepreneurPulse.com.au  Fact Sheet for Healthcare Providers:  IncredibleEmployment.be  This test is no t yet approved or cleared by the [place] FDA and  has been authorized for detection and/or diagnosis of SARS-CoV-2 by FDA under an Emergency Use Authorization (EUA). This EUA will remain  in effect (meaning this test can be used) for the duration of the COVID-19 declaration under Section 564(b)(1) of the Act, 21 U.S.C.section 360bbb-3(b)(1), unless the authorization is terminated  or revoked sooner.       Influenza A by PCR NEGATIVE NEGATIVE Final   Influenza B by PCR NEGATIVE NEGATIVE Final    Comment: (NOTE) The Xpert Xpress SARS-CoV-2/FLU/RSV plus assay is intended as an aid in the diagnosis of influenza from Nasopharyngeal swab specimens and should not be used as a sole basis for treatment. Nasal washings and aspirates are unacceptable for Xpert Xpress SARS-CoV-2/FLU/RSV testing.  Fact Sheet for Patients: EntrepreneurPulse.com.au  Fact Sheet for Healthcare Providers: IncredibleEmployment.be  This test is not yet approved or cleared by the [place] FDA and has been authorized for detection and/or diagnosis of SARS-CoV-2 by FDA under an Emergency Use Authorization (EUA). This EUA will remain in effect (meaning this test can be used) for the duration of the COVID-19 declaration under Section 564(b)(1) of the Act, 21 U.S.C. section 360bbb-3(b)(1), unless the authorization is terminated or revoked.  Performed at [facility], [address], [place], [place]  [address]   Culture, blood (Routine X 2) w Reflex to ID Panel     Status: Abnormal   Collection Time: 12/03/21  6:53 PM   Specimen: BLOOD RIGHT ARM  Result Value Ref Range Status   Specimen Description   Final    BLOOD RIGHT ARM BOTTLES DRAWN AEROBIC ONLY Performed at [facility], [address] [place], [place] [address]    Special Requests   Final    Blood Culture results may not be optimal due to an inadequate volume of blood received in culture bottles Performed at [facility], [address], [place], [place] [address]    Culture  Setup Time   Final    Gram Stain Report Called to,Read Back By and Verified With: VALDESE DILDY @ 9794 ON 12/06/21 C VARNER CORRECTED RESULTS GRAM POSITIVE RODS PREVIOUSLY REPORTED AS: GRAM POSITIVE COCCI CORRECTED RESULTS CALLED TO: PHARMD G.COFFEE AT 8016 ON 12/07/2021 BY T.SAAD.    Culture (A)  Final    DIPHTHEROIDS(CORYNEBACTERIUM SPECIES) Standardized susceptibility testing for this organism is not available. Performed at [facility], [place] [address] [place], [place] [address]    Report Status 12/07/2021 FINAL  Final  Culture, blood (Routine X 2) w Reflex to ID Panel     Status: None   Collection Time: 12/03/21  6:54 PM   Specimen: BLOOD RIGHT WRIST  Result Value Ref Range Status   Specimen Description BLOOD RIGHT WRIST BOTTLES DRAWN AEROBIC ONLY  Final   Special Requests   Final    Blood Culture results may not be optimal due to an inadequate volume of blood received in culture bottles   Culture   Final    NO GROWTH 5 DAYS Performed at [facility], [address], [place], [place] [address]    Report Status 12/08/2021 FINAL  Final     Scheduled Meds:  sodium chloride   Intravenous Once   aspirin  300 mg Rectal Daily   Chlorhexidine Gluconate Cloth  6 each Topical Daily   feeding supplement  237 mL Oral BID BM   heparin  5,000 Units Subcutaneous Q8H   levothyroxine  100 mcg Intravenous Daily   pantoprazole (PROTONIX) IV  40 mg  Intravenous Q24H   sodium chloride flush  10-40 mL Intracatheter Q12H   sodium chloride flush  3 mL Intravenous Q12H   sodium chloride flush  3 mL Intravenous Q12H   thiamine injection  100 mg Intravenous Daily   Continuous Infusions:  sodium chloride 250 mL (12/08/21 1011)   ampicillin-sulbactam (UNASYN) IV 3 g (12/08/21 1043)   dextrose 100 mL/hr at 12/08/21 1037   potassium chloride 10 mEq (12/08/21 1025)    Procedures/Studies: CT ANGIO HEAD NECK W WO CM  Result Date: 12/04/2021 CLINICAL DATA:  71 year old female with neurologic deficit, altered mental status. EXAM: CT ANGIOGRAPHY HEAD AND NECK TECHNIQUE: Multidetector CT imaging of the head and neck was performed using the standard protocol during bolus administration of intravenous contrast. Multiplanar CT image reconstructions and MIPs were obtained to evaluate the vascular anatomy. Carotid stenosis measurements (when applicable) are obtained utilizing NASCET criteria, using the distal internal carotid diameter as the denominator. RADIATION DOSE REDUCTION: This exam was performed according to the departmental dose-optimization program which includes automated exposure control, adjustment of the mA and/or kV according to patient size and/or use of iterative reconstruction technique. CONTRAST:  53mL OMNIPAQUE IOHEXOL 350 MG/ML SOLN COMPARISON:  Head CT 12/03/2021. Brain MRI 07/08/2021. Carotid ultrasound 04/20/2021. Chest CT 05/20/2021. FINDINGS: CT HEAD Brain: Scaphocephaly. Stable non contrast CT appearance of the brain. Patchy and confluent bilateral cerebral white matter hypodensity. Small area of cortical encephalomalacia in the right superior frontal gyrus. Calvarium and skull  base: No acute osseous abnormality identified. Paranasal sinuses: Visualized paranasal sinuses and mastoids are clear. Orbits: No acute orbit or scalp soft tissue finding. CTA NECK Skeleton: Previous C4-C5 and C5-C6 ACDF with solid arthrodesis. Superimposed cervical  spine degeneration. No acute osseous abnormality identified. Upper chest: Opacified bronchus intermedius with patchy and nodular peribronchial opacity in the posterior right upper lobe and throughout the visible superior segment of the right lower lobe. Some upper lobe airway thickening and/or opacification. Negative left lung. Visible central pulmonary arteries appear intact. No superior mediastinal lymphadenopathy. Other neck: No acute neck soft tissue finding. Aortic arch: Tortuous aortic arch. Calcified aortic atherosclerosis. 3 vessel arch configuration. Right carotid system: Tortuous brachiocephalic artery and proximal right CCA without plaque or stenosis. Negative right carotid bifurcation. Tortuous right ICA distal to the bulb but no stenosis. Left carotid system: Negative aside from tortuosity. Vertebral arteries: Tortuous proximal right subclavian artery with mild calcified plaque and no stenosis. Normal right vertebral artery origin. Tortuous right vertebral artery appears somewhat dominant and is patent to the skull base without stenosis. Left vertebral artery negative aside from tortuosity, mildly non dominant. CTA HEAD Posterior circulation: Dominant right vertebral V4 segment is tortuous with calcified atherosclerosis but no stenosis. Negative left V4 segment. Normal PICA origins. Tortuous vertebrobasilar junction and basilar artery with minimal calcified plaque and no stenosis. Patent SCA and PCA origins. Posterior communicating arteries are diminutive or absent. Bilateral PCA branches are normal aside from tortuosity. Anterior circulation: Both ICA siphons are patent. Mild siphon tortuosity. Mild calcified atherosclerosis and irregularity of the cavernous and supraclinoid segments but no significant stenosis. Patent carotid termini. Normal MCA and ACA origins. Mildly tortuous A1 segments. Normal anterior communicating artery. Bilateral ACA branches are within normal limits. Left MCA M1 segment  bifurcates early without stenosis. Right MCA M1 and bifurcation are patent without stenosis. Bilateral MCA branches appear normal aside from tortuosity. Venous sinuses: Early contrast timing but grossly patent. Anatomic variants: Dominant right vertebral artery. Review of the MIP images confirms the above findings IMPRESSION: 1. Negative for large vessel occlusion. Pronounced generalized arterial tortuosity in the head and neck. But mild for age atherosclerosis and no significant stenosis. 2. Abnormal Right Lung, with opacified bronchus intermedius, abnormal right upper and lower lobe peribronchial opacity. Constellation favors favor Multilobar Pneumonia Secondary To Aspiration. Follow-up Chest Radiographs may be valuable. 3. Stable CT appearance of the brain. Previous C4-C5 and C5-C6 ACDF with solid arthrodesis. 4. Aortic Atherosclerosis (ICD10-I70.0). Electronically Signed   By: Genevie Ann M.D.   On: 12/04/2021 08:57   DG Chest 1 View  Result Date: 12/03/2021 CLINICAL DATA:  Altered mental status EXAM: CHEST  1 VIEW COMPARISON:  07/09/2021 FINDINGS: The heart size and mediastinal contours are within normal limits. Both lungs are clear. The visualized skeletal structures are unremarkable. IMPRESSION: No acute abnormality of the lungs in AP portable projection. Electronically Signed   By: Delanna Ahmadi M.D.   On: 12/03/2021 14:05   CT HEAD WO CONTRAST  Result Date: 12/03/2021 CLINICAL DATA:  Mental status changes. EXAM: CT HEAD WITHOUT CONTRAST TECHNIQUE: Contiguous axial images were obtained from the base of the skull through the vertex without intravenous contrast. RADIATION DOSE REDUCTION: This exam was performed according to the departmental dose-optimization program which includes automated exposure control, adjustment of the mA and/or kV according to patient size and/or use of iterative reconstruction technique. COMPARISON:  07/07/2021 FINDINGS: Brain: There is no evidence for acute hemorrhage,  hydrocephalus, mass lesion, or abnormal extra-axial fluid collection. No  definite CT evidence for acute infarction. Patchy low attenuation in the deep hemispheric and periventricular white matter is nonspecific, but likely reflects chronic microvascular ischemic demyelination. Vascular: No hyperdense vessel or unexpected calcification. Skull: No evidence for fracture. No worrisome lytic or sclerotic lesion. Sinuses/Orbits: The visualized paranasal sinuses and mastoid air cells are clear. Visualized portions of the globes and intraorbital fat are unremarkable. Other: None. IMPRESSION: 1. No acute intracranial abnormality. 2. Chronic small vessel ischemic disease. Electronically Signed   By: Misty Stanley M.D.   On: 12/03/2021 14:03   US RENAL  Result Date: 12/05/2021 CLINICAL DATA:  AKI EXAM: RENAL / URINARY TRACT ULTRASOUND COMPLETE COMPARISON:  Renal ultrasound 07/07/2021, CT abdomen/pelvis 11/07/2019 FINDINGS: Right Kidney: Renal measurements: 9.3 cm x 4.3 cm x 5.6 cm = volume: 117 mL. Parenchymal echogenicity is increased. There is no hydronephrosis. Left Kidney: Renal measurements: 8.4 cm x 4.5 cm x 4.6 cm = volume: 91 mL. Parenchymal echogenicity is increased. There is no hydronephrosis. Bladder: Appears normal for degree of bladder distention. Other: None. IMPRESSION: Increased parenchymal echogenicity bilaterally consistent with medical renal disease. No hydronephrosis. Electronically Signed   By: Valetta Mole M.D.   On: 12/05/2021 11:32   [place] EKG SITE RITE  Result Date: 12/05/2021 If [facility] image not attached, placement could not be confirmed due to current cardiac rhythm.   Orson Eva, DO  [facility]  If 7PM-7AM, please contact night-coverage [email or web address] Password [facility] 12/08/2021, 12:07 PM   LOS: 4 days

## 2021-12-09 ENCOUNTER — Inpatient Hospital Stay (HOSPITAL_COMMUNITY): Payer: PPO

## 2021-12-09 ENCOUNTER — Telehealth: Payer: Self-pay | Admitting: Family Medicine

## 2021-12-09 DIAGNOSIS — N179 Acute kidney failure, unspecified: Secondary | ICD-10-CM | POA: Diagnosis not present

## 2021-12-09 DIAGNOSIS — G9341 Metabolic encephalopathy: Secondary | ICD-10-CM | POA: Diagnosis not present

## 2021-12-09 DIAGNOSIS — E870 Hyperosmolality and hypernatremia: Secondary | ICD-10-CM | POA: Diagnosis not present

## 2021-12-09 DIAGNOSIS — N1832 Chronic kidney disease, stage 3b: Secondary | ICD-10-CM | POA: Diagnosis not present

## 2021-12-09 LAB — CBC
HCT: 25.8 % — ABNORMAL LOW (ref 36.0–46.0)
Hemoglobin: 7.9 g/dL — ABNORMAL LOW (ref 12.0–15.0)
MCH: 26.8 pg (ref 26.0–34.0)
MCHC: 30.6 g/dL (ref 30.0–36.0)
MCV: 87.5 fL (ref 80.0–100.0)
Platelets: 106 10*3/uL — ABNORMAL LOW (ref 150–400)
RBC: 2.95 MIL/uL — ABNORMAL LOW (ref 3.87–5.11)
RDW: 16.1 % — ABNORMAL HIGH (ref 11.5–15.5)
WBC: 11.7 10*3/uL — ABNORMAL HIGH (ref 4.0–10.5)
nRBC: 0.2 % (ref 0.0–0.2)

## 2021-12-09 LAB — BASIC METABOLIC PANEL
Anion gap: 10 (ref 5–15)
BUN: 52 mg/dL — ABNORMAL HIGH (ref 8–23)
CO2: 16 mmol/L — ABNORMAL LOW (ref 22–32)
Calcium: 8.6 mg/dL — ABNORMAL LOW (ref 8.9–10.3)
Chloride: 114 mmol/L — ABNORMAL HIGH (ref 98–111)
Creatinine, Ser: 2.1 mg/dL — ABNORMAL HIGH (ref 0.44–1.00)
GFR, Estimated: 25 mL/min — ABNORMAL LOW (ref >60)
Glucose, Bld: 124 mg/dL — ABNORMAL HIGH (ref 70–99)
Potassium: 4.2 mmol/L (ref 3.5–5.1)
Sodium: 140 mmol/L (ref 135–145)

## 2021-12-09 LAB — TRANSFUSION REACTION
DAT C3: NEGATIVE
Post RXN DAT IgG: NEGATIVE

## 2021-12-09 LAB — MAGNESIUM: Magnesium: 1.6 mg/dL — ABNORMAL LOW (ref 1.7–2.4)

## 2021-12-09 MED ORDER — CEFDINIR 300 MG PO CAPS: 300.0000 mg | ORAL_CAPSULE | Freq: Every day | ORAL | 0 refills | Status: DC

## 2021-12-09 MED ORDER — CLINDAMYCIN HCL 150 MG PO CAPS
300.0000 mg | ORAL_CAPSULE | Freq: Three times a day (TID) | ORAL | Status: DC
  Administered 2021-12-09 – 2021-12-12 (×9): 300 mg via ORAL
  Filled 2021-12-09 (×9): qty 2

## 2021-12-09 MED ORDER — CLINDAMYCIN HCL 300 MG PO CAPS: 300.0000 mg | ORAL_CAPSULE | Freq: Three times a day (TID) | ORAL | 0 refills | Status: DC

## 2021-12-09 MED ORDER — MAGNESIUM SULFATE 2 GM/50ML IV SOLN
2.0000 g | Freq: Once | INTRAVENOUS | Status: AC
  Administered 2021-12-09: 2 g via INTRAVENOUS
  Filled 2021-12-09: qty 50

## 2021-12-09 MED ORDER — CEFDINIR 300 MG PO CAPS
300.0000 mg | ORAL_CAPSULE | Freq: Every day | ORAL | Status: DC
  Administered 2021-12-09 – 2021-12-12 (×4): 300 mg via ORAL
  Filled 2021-12-09 (×4): qty 1

## 2021-12-09 NOTE — Telephone Encounter (Signed)
Cassandra at [facility] notified

## 2021-12-09 NOTE — Telephone Encounter (Signed)
Yes-please let her know that I will.  Please also verify which hospice service-in other words which hospice company thank you

## 2021-12-09 NOTE — Telephone Encounter (Signed)
[facility]- Left message to return call with [place]

## 2021-12-09 NOTE — Discharge Summary (Signed)
Physician Discharge Summary   Patient: Joanna [name] MRN: [id number] DOB: June 30, 1951  Admit date:     12/03/2021  Discharge date: 12/09/21  Discharge Physician: Shanon Brow Keandrea Tapley   PCP: [name], MD   Recommendations at discharge:  [place] [facility] Course:  71 year old female who is a resident of a family care home, with history of hypertension, diabetes, chronic kidney disease stage IIIb, mild dementia, brought to the hospital with progressive encephalopathy.  Noted to be dehydrated with acute kidney injury, hypernatremia.  Imaging also indicated aspiration pneumonia.  She was started on IV unasyn.   She is on IV fluids for rehydration She was started on 1/2NS for hypernatremia.  This was changed to D5W when pt had hypoglycemia.  She was recently hospitalized from 10/31/21 to 11/02/21 due to ABLA and FOBT+.  During that hospitalization she underwent EGD which showed Two non-bleeding cratered gastric ulcers with a clean ulcer base (Forrest Class III) were found in the gastric antrum.   no presence of hematin or active bleeding was present.  She was transfused 2 units PRBC. Palliative medicine was consulted to assist and had [place] discussions with family.  Ultimately, it was decided the best disposition was to d/c back to ALF with palliative services.  Assessment and Plan: Acute metabolic encephalopathy -multifactorial including renal failure and hypernatremia due to dehydration and aspiration pneumonia -CT head and CTA head and neck did not show any acute findings -continue with IV fluids and monitor electrolytes/urine output -Since admission, appears to be making very slow improvements -2/23--A&O x 1; answers simple yes/no questions; not following commands 2/24--a&o X 2, answers simple yes/no questions, occasionally follows commands   Acute kidney injury on chronic kidney disease stage IIIb -Related to dehydration from decreased p.o.  intake -Continue IV fluids  -Renal ultrasound without evidence of hydronephrosis -I suspect the initial creatinine value of 0.6 on admission was an erroneous lab result -Baseline creatinine likely ranging between 1.2-1.5 -Admission creatinine 2.6 -serum creatinine 2.10 at time of d/c -likely has new renal baseline 2.0-2.1   Acute on chronic anemia -transfuse 2 units PRBC>>Hgb stable after transfusion -discussed with daughter>>does not want any further procedures -continue PPI bid Hgb 10.5 on day of d/c   Hypernatremia -Due to dehydration and poor p.o. intake -Continue hypotonic fluids >>switch to D5W -increase rate on IVF to 100cc/hr Improved--Na 141 on day of d/c   Hypoalbuminemia/anasarca -We will need to hydrate cautiously with IV fluids -She is prone to third space fluid which may lead to pulmonary edema and shortness of breath   Aspiration pneumonia -Noted on CT imaging -Continue Unasyn D#4 -d/c home with cefdinir and clinda x 3 more days   Hypothyroidism TSH 42, which has been trending down over the past 3 months from 59 She is continued on levothyroxine, but will change to IV since unable to take p.o. at this time   Type 2 diabetes -Continued on sliding scale insulin   Hypokalemia -repleted IV and po  Hypomagnesemia -repleted IV and po   Chronic neuromuscular deficits -Chronic left-sided spastic contractures -Patient has been chronically bedbound   Major neurocognitive Disorder -Family reports that she is normally awake, alert, able to participate in conversation appropriately -Her daughter also reports that over the past few weeks she has been having decreased p.o. intake, at times refusing to eat or eating very little -Her mood otherwise appeared to be normal/engaged until she became encephalopathic -She had a similar incident last year  where she became extremely dehydrated, but then began to eat and her acute issues at that time resolved.  She was admitted  to [facility]. -We will need to reassess mental status once uremia and other electrolyte abnormalities have been corrected -If she continues to refuse any p.o. intake or does not take in enough p.o. intake, will need to readdress goals of care with patient's family -po intake remains poor -Palliative medicine re-consulted   Goals of care -During her last admission in September, she was seen by palliative care at that time and family had agreed to DNR status -This was reconfirmed with patient's daughter, that DNR is still in place -I mentioned that it would be helpful if palliative care follow in her hospital course as another layer of support and to help address goals of care>>no PEG -after GOC discussion with family, they decided to d/c pt back to ALF with hospice/palliative services        Consultants: palliative Procedures performed: none Disposition: Assisted living Diet recommendation:  Dysphagia 1 with thin liquids  DISCHARGE MEDICATION: Allergies as of 12/09/2021       Reactions   Zoloft [sertraline Hcl] Other (See Comments)   Made the patient feel withdrawn and had bad dreams- "Allergic," per MAR   Ivp Dye [iodinated Contrast Media] Rash, Other (See Comments)   "Allergic," per [facility]        Medication List     STOP taking these medications    acetaminophen 500 MG tablet Commonly known as: TYLENOL   albuterol 108 (90 Base) MCG/ACT inhaler Commonly known as: VENTOLIN HFA   fexofenadine 180 MG tablet Commonly known as: ALLEGRA       TAKE these medications    ascorbic acid 500 MG tablet Commonly known as: VITAMIN C Take 1 tablet (500 mg total) by mouth daily.   cefdinir 300 MG capsule Commonly known as: OMNICEF Take 1 capsule (300 mg total) by mouth daily. X 3 days   clindamycin 300 MG capsule Commonly known as: CLEOCIN Take 1 capsule (300 mg total) by mouth every 8 (eight) hours. X 3 days   DULoxetine 60 MG capsule Commonly known as:  Cymbalta Take 1 capsule (60 mg total) by mouth daily.   Ensure Plus High Protein Liqd Take 237 mLs by mouth 3 (three) times daily.   ferrous sulfate 325 (65 FE) MG tablet Take 325 mg by mouth daily with breakfast.   folic acid 1 MG tablet Commonly known as: FOLVITE TAKE (1) TABLET BY MOUTH ONCE DAILY. What changed:  how much to take how to take this when to take this additional instructions   levothyroxine 150 MCG tablet Commonly known as: SYNTHROID Take 1 tablet (150 mcg total) by mouth daily.   Magnesium 250 MG Tabs Take 1 tablet (250 mg total) by mouth 2 (two) times daily.   multivitamin with minerals Tabs tablet Take 1 tablet by mouth daily.   pantoprazole 40 MG tablet Commonly known as: Protonix Take 1 tablet (40 mg total) by mouth 2 (two) times daily.   rosuvastatin 20 MG tablet Commonly known as: CRESTOR Take 1 tablet by mouth daily (Stop Pravastatin) What changed:  how much to take how to take this when to take this additional instructions   thiamine 100 MG tablet Take 1 tablet (100 mg total) by mouth daily.   topiramate 50 MG tablet Commonly known as: Topamax Take 1 tablet (50 mg total) by mouth 2 (two) times daily.   traZODone 50 MG  tablet Commonly known as: DESYREL TAKE 1/2 TO 1 TABLET BY MOUTH EVERY NIGHT FOR SLEEP What changed:  how much to take how to take this when to take this additional instructions   vitamin B-12 1000 MCG tablet Commonly known as: CYANOCOBALAMIN TAKE (1) TABLET BY MOUTH ONCE DAILY. What changed: See the new instructions.   zinc sulfate 220 (50 Zn) MG capsule Take 1 capsule (220 mg total) by mouth daily.         Discharge Exam: Filed Weights   12/03/21 1326  Weight: 48 kg   HEENT:  [place]/AT, No thrush, no icterus CV:  RRR, no rub, no S3, no S4 Lung:  bibasilar rales, no wheeze, no rhonchi Abd:  soft/+BS, NT Ext:  No edema, no lymphangitis, no synovitis, no rash   Condition at discharge: stable  The  results of significant diagnostics from this hospitalization (including imaging, microbiology, ancillary and laboratory) are listed below for reference.   Imaging Studies: CT ANGIO HEAD NECK W WO CM  Result Date: 12/04/2021 CLINICAL DATA:  71 year old female with neurologic deficit, altered mental status. EXAM: CT ANGIOGRAPHY HEAD AND NECK TECHNIQUE: Multidetector CT imaging of the head and neck was performed using the standard protocol during bolus administration of intravenous contrast. Multiplanar CT image reconstructions and MIPs were obtained to evaluate the vascular anatomy. Carotid stenosis measurements (when applicable) are obtained utilizing NASCET criteria, using the distal internal carotid diameter as the denominator. RADIATION DOSE REDUCTION: This exam was performed according to the departmental dose-optimization program which includes automated exposure control, adjustment of the mA and/or kV according to patient size and/or use of iterative reconstruction technique. CONTRAST:  72mL OMNIPAQUE IOHEXOL 350 MG/ML SOLN COMPARISON:  Head CT 12/03/2021. Brain MRI 07/08/2021. Carotid ultrasound 04/20/2021. Chest CT 05/20/2021. FINDINGS: CT HEAD Brain: Scaphocephaly. Stable non contrast CT appearance of the brain. Patchy and confluent bilateral cerebral white matter hypodensity. Small area of cortical encephalomalacia in the right superior frontal gyrus. Calvarium and skull base: No acute osseous abnormality identified. Paranasal sinuses: Visualized paranasal sinuses and mastoids are clear. Orbits: No acute orbit or scalp soft tissue finding. CTA NECK Skeleton: Previous C4-C5 and C5-C6 ACDF with solid arthrodesis. Superimposed cervical spine degeneration. No acute osseous abnormality identified. Upper chest: Opacified bronchus intermedius with patchy and nodular peribronchial opacity in the posterior right upper lobe and throughout the visible superior segment of the right lower lobe. Some upper lobe  airway thickening and/or opacification. Negative left lung. Visible central pulmonary arteries appear intact. No superior mediastinal lymphadenopathy. Other neck: No acute neck soft tissue finding. Aortic arch: Tortuous aortic arch. Calcified aortic atherosclerosis. 3 vessel arch configuration. Right carotid system: Tortuous brachiocephalic artery and proximal right CCA without plaque or stenosis. Negative right carotid bifurcation. Tortuous right ICA distal to the bulb but no stenosis. Left carotid system: Negative aside from tortuosity. Vertebral arteries: Tortuous proximal right subclavian artery with mild calcified plaque and no stenosis. Normal right vertebral artery origin. Tortuous right vertebral artery appears somewhat dominant and is patent to the skull base without stenosis. Left vertebral artery negative aside from tortuosity, mildly non dominant. CTA HEAD Posterior circulation: Dominant right vertebral V4 segment is tortuous with calcified atherosclerosis but no stenosis. Negative left V4 segment. Normal PICA origins. Tortuous vertebrobasilar junction and basilar artery with minimal calcified plaque and no stenosis. Patent SCA and PCA origins. Posterior communicating arteries are diminutive or absent. Bilateral PCA branches are normal aside from tortuosity. Anterior circulation: Both ICA siphons are patent. Mild siphon tortuosity. Mild calcified  atherosclerosis and irregularity of the cavernous and supraclinoid segments but no significant stenosis. Patent carotid termini. Normal MCA and ACA origins. Mildly tortuous A1 segments. Normal anterior communicating artery. Bilateral ACA branches are within normal limits. Left MCA M1 segment bifurcates early without stenosis. Right MCA M1 and bifurcation are patent without stenosis. Bilateral MCA branches appear normal aside from tortuosity. Venous sinuses: Early contrast timing but grossly patent. Anatomic variants: Dominant right vertebral artery. Review of  the MIP images confirms the above findings IMPRESSION: 1. Negative for large vessel occlusion. Pronounced generalized arterial tortuosity in the head and neck. But mild for age atherosclerosis and no significant stenosis. 2. Abnormal Right Lung, with opacified bronchus intermedius, abnormal right upper and lower lobe peribronchial opacity. Constellation favors favor Multilobar Pneumonia Secondary To Aspiration. Follow-up Chest Radiographs may be valuable. 3. Stable CT appearance of the brain. Previous C4-C5 and C5-C6 ACDF with solid arthrodesis. 4. Aortic Atherosclerosis (ICD10-I70.0). Electronically Signed   By: Genevie Ann M.D.   On: 12/04/2021 08:57   DG Chest 1 View  Result Date: 12/03/2021 CLINICAL DATA:  Altered mental status EXAM: CHEST  1 VIEW COMPARISON:  07/09/2021 FINDINGS: The heart size and mediastinal contours are within normal limits. Both lungs are clear. The visualized skeletal structures are unremarkable. IMPRESSION: No acute abnormality of the lungs in AP portable projection. Electronically Signed   By: Delanna Ahmadi M.D.   On: 12/03/2021 14:05   CT HEAD WO CONTRAST  Result Date: 12/03/2021 CLINICAL DATA:  Mental status changes. EXAM: CT HEAD WITHOUT CONTRAST TECHNIQUE: Contiguous axial images were obtained from the base of the skull through the vertex without intravenous contrast. RADIATION DOSE REDUCTION: This exam was performed according to the departmental dose-optimization program which includes automated exposure control, adjustment of the mA and/or kV according to patient size and/or use of iterative reconstruction technique. COMPARISON:  07/07/2021 FINDINGS: Brain: There is no evidence for acute hemorrhage, hydrocephalus, mass lesion, or abnormal extra-axial fluid collection. No definite CT evidence for acute infarction. Patchy low attenuation in the deep hemispheric and periventricular white matter is nonspecific, but likely reflects chronic microvascular ischemic demyelination.  Vascular: No hyperdense vessel or unexpected calcification. Skull: No evidence for fracture. No worrisome lytic or sclerotic lesion. Sinuses/Orbits: The visualized paranasal sinuses and mastoid air cells are clear. Visualized portions of the globes and intraorbital fat are unremarkable. Other: None. IMPRESSION: 1. No acute intracranial abnormality. 2. Chronic small vessel ischemic disease. Electronically Signed   By: Misty Stanley M.D.   On: 12/03/2021 14:03   US RENAL  Result Date: 12/05/2021 CLINICAL DATA:  AKI EXAM: RENAL / URINARY TRACT ULTRASOUND COMPLETE COMPARISON:  Renal ultrasound 07/07/2021, CT abdomen/pelvis 11/07/2019 FINDINGS: Right Kidney: Renal measurements: 9.3 cm x 4.3 cm x 5.6 cm = volume: 117 mL. Parenchymal echogenicity is increased. There is no hydronephrosis. Left Kidney: Renal measurements: 8.4 cm x 4.5 cm x 4.6 cm = volume: 91 mL. Parenchymal echogenicity is increased. There is no hydronephrosis. Bladder: Appears normal for degree of bladder distention. Other: None. IMPRESSION: Increased parenchymal echogenicity bilaterally consistent with medical renal disease. No hydronephrosis. Electronically Signed   By: Valetta Mole M.D.   On: 12/05/2021 11:32   DG Chest Port 1 View  Result Date: 12/09/2021 CLINICAL DATA:  Progressive encephalopathy, bleeding from the PICC line. EXAM: PORTABLE CHEST 1 VIEW COMPARISON:  Portable chest 12/03/2021. FINDINGS: The heart is slightly enlarged. No vascular congestion is seen. There is aortic atherosclerosis with mild tortuosity and stable mediastinum. The lungs are generally clear  as far seen. The patient's right hand superimposes over the lower right chest and partially obscures the right lower lung field. A right PICC has been inserted since prior study. The tip is in the upper right atrium. There is no pneumothorax. Multiple overlying monitor wires. Slight thoracic dextroscoliosis. IMPRESSION: Visualized lungs clear with partial obscuration of the  right lower lung field by the patient's right hand. Interval right PICC insertion with tip in the upper right atrium. Aortic atherosclerosis. Electronically Signed   By: Telford Nab M.D.   On: 12/09/2021 03:26   [place] EKG SITE RITE  Result Date: 12/05/2021 If [facility] image not attached, placement could not be confirmed due to current cardiac rhythm.   Microbiology: Results for orders placed or performed during the hospital encounter of 12/03/21  Urine Culture     Status: None   Collection Time: 12/03/21  1:37 PM   Specimen: In/Out Cath Urine  Result Value Ref Range Status   Specimen Description   Final    IN/OUT CATH URINE Performed at [facility], [address], [place], [place] [address]    Special Requests   Final    Normal Performed at [facility], [address], [place], [place] [address]    Culture   Final    NO GROWTH Performed at [facility], [place] [address] [place], [place] [address]    Report Status 12/05/2021 FINAL  Final  Resp Panel by RT-PCR (Flu A&B, Covid) Nasopharyngeal Swab     Status: None   Collection Time: 12/03/21  5:39 PM   Specimen: Nasopharyngeal Swab; Nasopharyngeal(NP) swabs in vial transport medium  Result Value Ref Range Status   SARS Coronavirus 2 by RT PCR NEGATIVE NEGATIVE Final    Comment: (NOTE) SARS-CoV-2 target nucleic acids are NOT DETECTED.  The SARS-CoV-2 RNA is generally detectable in upper respiratory specimens during the acute phase of infection. The lowest concentration of SARS-CoV-2 viral copies this assay can detect is 138 copies/mL. A negative result does not preclude SARS-Cov-2 infection and should not be used as the sole basis for treatment or other patient management decisions. A negative result may occur with  improper specimen collection/handling, submission of specimen other than nasopharyngeal swab, presence of viral mutation(s) within the areas targeted by this assay, and inadequate number of viral copies(<138  copies/mL). A negative result must be combined with clinical observations, patient history, and epidemiological information. The expected result is Negative.  Fact Sheet for Patients:  EntrepreneurPulse.com.au  Fact Sheet for Healthcare Providers:  IncredibleEmployment.be  This test is no t yet approved or cleared by the [place] FDA and  has been authorized for detection and/or diagnosis of SARS-CoV-2 by FDA under an Emergency Use Authorization (EUA). This EUA will remain  in effect (meaning this test can be used) for the duration of the COVID-19 declaration under Section 564(b)(1) of the Act, 21 U.S.C.section 360bbb-3(b)(1), unless the authorization is terminated  or revoked sooner.       Influenza A by PCR NEGATIVE NEGATIVE Final   Influenza B by PCR NEGATIVE NEGATIVE Final    Comment: (NOTE) The Xpert Xpress SARS-CoV-2/FLU/RSV plus assay is intended as an aid in the diagnosis of influenza from Nasopharyngeal swab specimens and should not be used as a sole basis for treatment. Nasal washings and aspirates are unacceptable for Xpert Xpress SARS-CoV-2/FLU/RSV testing.  Fact Sheet for Patients: EntrepreneurPulse.com.au  Fact Sheet for Healthcare Providers: IncredibleEmployment.be  This test is not yet approved or cleared by the  [place] Architectural technologist and has been authorized for detection and/or diagnosis of SARS-CoV-2 by FDA under an Print production planner (EUA). This EUA will remain in effect (meaning this test can be used) for the duration of the COVID-19 declaration under Section 564(b)(1) of the Act, 21 U.S.C. section 360bbb-3(b)(1), unless the authorization is terminated or revoked.  Performed at [facility], [address] [place], [place] [address]   Culture, blood (Routine X 2) w Reflex to ID Panel     Status: Abnormal   Collection Time: 12/03/21  6:53 PM   Specimen: BLOOD RIGHT ARM   Result Value Ref Range Status   Specimen Description   Final    BLOOD RIGHT ARM BOTTLES DRAWN AEROBIC ONLY Performed at [facility], [address] [place], [place] [address]    Special Requests   Final    Blood Culture results may not be optimal due to an inadequate volume of blood received in culture bottles Performed at [facility], [address], [place], [place] [address]    Culture  Setup Time   Final    Gram Stain Report Called to,Read Back By and Verified With: VALDESE DILDY @ 3664 ON 12/06/21 C VARNER CORRECTED RESULTS GRAM POSITIVE RODS PREVIOUSLY REPORTED AS: GRAM POSITIVE COCCI CORRECTED RESULTS CALLED TO: PHARMD G.COFFEE AT 4034 ON 12/07/2021 BY T.SAAD.    Culture (A)  Final    DIPHTHEROIDS(CORYNEBACTERIUM SPECIES) Standardized susceptibility testing for this organism is not available. Performed at [facility], [place] [address], [place], [place] [address]    Report Status 12/07/2021 FINAL  Final  Culture, blood (Routine X 2) w Reflex to ID Panel     Status: None   Collection Time: 12/03/21  6:54 PM   Specimen: BLOOD RIGHT WRIST  Result Value Ref Range Status   Specimen Description BLOOD RIGHT WRIST BOTTLES DRAWN AEROBIC ONLY  Final   Special Requests   Final    Blood Culture results may not be optimal due to an inadequate volume of blood received in culture bottles   Culture   Final    NO GROWTH 5 DAYS Performed at [facility], [address] [place], [place] [address]    Report Status 12/08/2021 FINAL  Final    Labs: CBC: Recent Labs  Lab 12/03/21 1353 12/04/21 0423 12/05/21 0513 12/06/21 0346 12/07/21 0623 12/08/21 0452 12/08/21 0725 12/09/21 0438  WBC 5.8   < > 7.5 11.5* 15.1* 13.0*  --  11.7*  NEUTROABS 4.1  --   --   --   --   --   --   --   HGB 12.6   < > 8.5* 7.9* 8.3* 6.7* 6.6* 7.9*  HCT 36.3   < > 29.4* 27.7* 28.3* 22.0* 21.8* 25.8*  MCV 96.5   < > 88.8 90.5 92.5 89.1  --  87.5  PLT 180   < > 185 163 148* 117*  --  106*   < > =  values in this interval not displayed.   Basic Metabolic Panel: Recent Labs  Lab 12/06/21 0346 12/06/21 0848 12/07/21 0623 12/08/21 0452 12/09/21 0150  NA 140 147* 148* 144 140  K 3.8 3.7 3.9 3.3* 4.2  CL 116* 120* 119* 117* 114*  CO2 18* 20* 19* 19* 16*  GLUCOSE 500* 151* 88 130* 124*  BUN 67* 67* 63* 61* 52*  CREATININE 2.04* 2.16* 2.21* 2.34* 2.10*  CALCIUM 9.4 10.0 9.2 8.5* 8.6*  MG 2.0  --  1.9 1.7 1.6*  PHOS 1.5* 1.6* 5.0*  --   --    Liver Function Tests: Recent Labs  Lab 12/04/21 1319 12/05/21 0513 12/06/21 0346 12/07/21 0623 12/08/21 0452  AST 18 11* 11* 111* 38  ALT 17 14 15  109* 72*  ALKPHOS 74 66 68 104 97  BILITOT 0.4 0.5 0.3 0.4 0.6  PROT 6.8 5.8* 5.3* 5.9* 5.0*  ALBUMIN 2.7* 2.3* 2.0* 2.1* 1.8*   CBG: Recent Labs  Lab 12/07/21 0754 12/07/21 1210 12/07/21 1744 12/08/21 0032 12/08/21 0952  GLUCAP 86 134* 74 247* 139*    Discharge time spent: greater than 30 minutes.  Signed: Orson Eva, MD [facility] 12/09/2021

## 2021-12-09 NOTE — [place] FL2 (Signed)
[place] MEDICAID FL2 LEVEL OF CARE SCREENING TOOL     IDENTIFICATION  Patient Name: Joanna [name] Birthdate: 05-10-1951 Sex: female Admission Date (Current Location): 12/03/2021  [facility] and [place] Number:  [employer] and Address:  [place] [address] [place]      Provider Number: [id number]  Attending Physician Name and Address:  Orson Eva, MD  Relative Name and Phone Number:  Carin Primrose [facility] [phone]    Current Level of Care: Hospital Recommended Level of Care: Family Care Home Prior Approval Number:    Date Approved/Denied:   PASRR Number:    Discharge Plan: Domiciliary (Rest home)    Current Diagnoses: Patient Active Problem List   Diagnosis Date Noted   Encephalopathy acute 12/04/2021   Pressure injury of skin 94/49/6759   Acute metabolic encephalopathy 16/38/4665   ABLA (acute blood loss anemia) 11/01/2021   Melena    Blood loss anemia 10/31/2021   GI bleed 10/31/2021   Spastic hemiparesis of left nondominant side ([place]) 10/27/2021   Stage 3b chronic kidney disease ([place]) 08/23/2021   Left-sided weakness    COVID    Protein-calorie malnutrition, severe 07/16/2021   Altered mental status    Acute renal failure superimposed on stage 4 chronic kidney disease ([place]) 06/26/2021   Dehydration 06/25/2021   AKI (acute kidney injury) ([place]) 06/25/2021   Fall at home, initial encounter 06/25/2021   Hypernatremia 06/25/2021   Dizziness and giddiness 04/19/2021   Hypomagnesemia 04/19/2021   Hypokalemia 04/19/2021   Near syncope 04/19/2021   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 ([place]) 01/19/2021   Non-recurrent acute suppurative otitis media of left ear without spontaneous rupture of tympanic membrane 01/06/2021   Seasonal allergic rhinitis due to pollen 01/06/2021   Abnormal CT scan, stomach 02/06/2020   Coronary artery disease due to lipid rich plaque 11/19/2019    Aortic atherosclerosis ([place]) 11/19/2019   Constipation 10/06/2019   AVM (arteriovenous malformation) of small bowel, acquired    Pain in joint of left shoulder 05/13/2019   Pain of left hand 04/24/2019   Adhesive capsulitis of left shoulder 04/16/2019   Pain in left foot 12/06/2018   IDA (iron deficiency anemia) 12/03/2018   Left hand weakness 08/16/2018   Numbness 07/10/2018   Sleep apnea 12/25/2017   Right carpal tunnel syndrome 12/24/2017   Chronic anxiety 02/13/2017   Right rotator cuff tendinitis 02/11/2015   Iron deficiency 08/25/2014   Microcytic anemia 06/17/2014   Insomnia 05/04/2014   Hypothyroidism 06/05/2013   Essential hypertension, benign 06/05/2013   Stress fracture of right foot 02/27/2013   Chronic radicular lumbar pain 02/27/2013   Hyperlipidemia 01/16/2013   Type 2 diabetes mellitus ([place]) 01/16/2013    Orientation RESPIRATION BLADDER Height & Weight     Self  Normal Incontinent Weight: 105 lb 13.1 oz (48 kg) Height:  5\' 2"  (157.5 cm)  BEHAVIORAL SYMPTOMS/MOOD NEUROLOGICAL BOWEL NUTRITION STATUS      Incontinent Diet (low sodium, heart healthy)  AMBULATORY STATUS COMMUNICATION OF NEEDS Skin   Total Care Non-Verbally PU Stage and Appropriate Care (stage 2: left, medial; right, medial. Deep tissue injury: heel, right, medial)                       Personal Care Assistance Level of Assistance  Total care       Total Care Assistance: Maximum assistance   Functional Limitations Info  Sight, Hearing, Speech Sight Info: Adequate  Hearing Info: Adequate Speech Info: Impaired    SPECIAL CARE FACTORS FREQUENCY                       Contractures Contractures Info: Present    Additional Factors Info  Code Status, Allergies Code Status Info: DNR Allergies Info: Zoloft, Ivp dye iodinated contrast media           Current Medications (12/09/2021):  This is the current hospital active medication list Current Facility-Administered Medications   Medication Dose Route Frequency Provider Last Rate Last Admin   0.9 %  sodium chloride infusion (Manually program via Guardrails IV Fluids)   Intravenous Once Tat, Shanon Brow, MD       0.9 %  sodium chloride infusion  250 mL Intravenous PRN Denton Brick, Courage, MD 10 mL/hr at 12/08/21 1011 250 mL at 12/08/21 1011   acetaminophen (TYLENOL) tablet 650 mg  650 mg Oral Q6H PRN Roxan Hockey, MD       Or   acetaminophen (TYLENOL) suppository 650 mg  650 mg Rectal Q6H PRN Emokpae, Courage, MD       albuterol (PROVENTIL) (2.5 MG/3ML) 0.083% nebulizer solution 2.5 mg  2.5 mg Inhalation Q4H PRN Denton Brick, Courage, MD       aspirin suppository 300 mg  300 mg Rectal Daily Emokpae, Courage, MD   300 mg at 12/08/21 1300   bisacodyl (DULCOLAX) suppository 10 mg  10 mg Rectal Daily PRN Roxan Hockey, MD       cefdinir (OMNICEF) capsule 300 mg  300 mg Oral Daily Tat, David, MD       Chlorhexidine Gluconate Cloth 2 % PADS 6 each  6 each Topical Daily Kathie Dike, MD   6 each at 12/08/21 1700   clindamycin (CLEOCIN) capsule 300 mg  300 mg Oral Q8H Tat, Shanon Brow, MD       dextrose 5 % solution   Intravenous Continuous Tat, David, MD 100 mL/hr at 12/08/21 1037 New Bag at 12/08/21 1037   feeding supplement (ENSURE ENLIVE / ENSURE PLUS) liquid 237 mL  237 mL Oral BID BM Kathie Dike, MD       levothyroxine (SYNTHROID, LEVOTHROID) injection 100 mcg  100 mcg Intravenous Daily Emokpae, Courage, MD   100 mcg at 12/09/21 0809   magnesium sulfate IVPB 2 g 50 mL  2 g Intravenous Once Tat, Shanon Brow, MD 50 mL/hr at 12/09/21 1020 2 g at 12/09/21 1020   ondansetron (ZOFRAN) tablet 4 mg  4 mg Oral Q6H PRN Emokpae, Courage, MD       Or   ondansetron (ZOFRAN) injection 4 mg  4 mg Intravenous Q6H PRN Emokpae, Courage, MD       pantoprazole (PROTONIX) injection 40 mg  40 mg Intravenous Q12H Orson Eva, MD   40 mg at 12/09/21 0813   polyethylene glycol (MIRALAX / GLYCOLAX) packet 17 g  17 g Oral Daily PRN Emokpae, Courage, MD        sodium chloride flush (NS) 0.9 % injection 10-40 mL  10-40 mL Intracatheter Q12H Kathie Dike, MD   10 mL at 12/09/21 0024   sodium chloride flush (NS) 0.9 % injection 10-40 mL  10-40 mL Intracatheter PRN Kathie Dike, MD       sodium chloride flush (NS) 0.9 % injection 3 mL  3 mL Intravenous Q12H Emokpae, Courage, MD   3 mL at 12/09/21 0024   sodium chloride flush (NS) 0.9 % injection 3 mL  3 mL Intravenous Q12H Roxan Hockey, MD  3 mL at 12/09/21 0024   sodium chloride flush (NS) 0.9 % injection 3 mL  3 mL Intravenous PRN Emokpae, Courage, MD       thiamine (B-1) injection 100 mg  100 mg Intravenous Daily Kathie Dike, MD   100 mg at 12/09/21 7622   traZODone (DESYREL) tablet 50 mg  50 mg Oral QHS PRN Roxan Hockey, MD         Discharge Medications: ascorbic acid 500 MG tablet Commonly known as: VITAMIN C Take 1 tablet (500 mg total) by mouth daily.    cefdinir 300 MG capsule Commonly known as: OMNICEF Take 1 capsule (300 mg total) by mouth daily. X 3 days    clindamycin 300 MG capsule Commonly known as: CLEOCIN Take 1 capsule (300 mg total) by mouth every 8 (eight) hours. X 3 days    DULoxetine 60 MG capsule Commonly known as: Cymbalta Take 1 capsule (60 mg total) by mouth daily.    Ensure Plus High Protein Liqd Take 237 mLs by mouth 3 (three) times daily.    ferrous sulfate 325 (65 FE) MG tablet Take 325 mg by mouth daily with breakfast.    folic acid 1 MG tablet Commonly known as: FOLVITE TAKE (1) TABLET BY MOUTH ONCE DAILY. What changed:  how much to take how to take this when to take this additional instructions    levothyroxine 150 MCG tablet Commonly known as: SYNTHROID Take 1 tablet (150 mcg total) by mouth daily.    Magnesium 250 MG Tabs Take 1 tablet (250 mg total) by mouth 2 (two) times daily.    multivitamin with minerals Tabs tablet Take 1 tablet by mouth daily.    pantoprazole 40 MG tablet Commonly known as: Protonix Take 1 tablet  (40 mg total) by mouth 2 (two) times daily.    rosuvastatin 20 MG tablet Commonly known as: CRESTOR Take 1 tablet by mouth daily (Stop Pravastatin) What changed:  how much to take how to take this when to take this additional instructions    thiamine 100 MG tablet Take 1 tablet (100 mg total) by mouth daily.    topiramate 50 MG tablet Commonly known as: Topamax Take 1 tablet (50 mg total) by mouth 2 (two) times daily.    traZODone 50 MG tablet Commonly known as: DESYREL TAKE 1/2 TO 1 TABLET BY MOUTH EVERY NIGHT FOR SLEEP What changed:  how much to take how to take this when to take this additional instructions    vitamin B-12 1000 MCG tablet Commonly known as: CYANOCOBALAMIN TAKE (1) TABLET BY MOUTH ONCE DAILY. What changed: See the new instructions.    zinc sulfate 220 (50 Zn) MG capsule Take 1 capsule (220 mg total) by mouth daily.    Relevant Imaging Results:  Relevant Lab Results:   Additional Information SSN: [id number], [place]

## 2021-12-09 NOTE — Progress Notes (Signed)
Chest xray confirmed proper placement of PICC line MD stated ok to continue to use this line. IVF started back and charge nurse obtained blood work for lab.

## 2021-12-09 NOTE — Care Management Important Message (Signed)
Important Message  Patient Details  Name: Joanna [name] MRN: [id number] Date of Birth: 1950/12/24   Medicare Important Message Given:  Other (see comment) (copy mailed to address on file)     Tommy Medal 12/09/2021, 11:58 AM

## 2021-12-09 NOTE — Progress Notes (Signed)
oozing of blood from PICC insertion site noted again and reported findings to Night MD and requested xray to check proper placement of PICC awaiting response.

## 2021-12-09 NOTE — Telephone Encounter (Signed)
Cassandra from Hospice calling to verify if Dr.Scott would be attending physician for patient while at hospice. Pt is currently at [facility]. Please advise. Thank you  Cassandra [phone]

## 2021-12-09 NOTE — TOC Transition Note (Addendum)
Transition of Care [facility]) - CM/SW Discharge Note   Patient Details  Name: Joanna [name] MRN: [id number] Date of Birth: 1951/09/12  Transition of Care [facility]) CM/SW Contact:  Iona Beard, [place] Phone Number: 12/09/2021, 11:07 AM   Clinical Narrative:    TOC updated that pt is ready for D/C today to [facility]. CSW reached out to [employer] who states pt can return today. He asked that [place] fax d/c summary and updated Fl2 to him at [phone]. CSW faxed requested documents to [facility]. Elberta Fortis also asked that any new scripts be sent to [place]. CSW updated MD of this. Elberta Fortis states that pt will need EMS and can return as soon as she is ready. CSW updated RN of this.   CSW to reach out to [facility] to ensure that referral was received and they will be starting care with pt in [facility]. CSW to complete med necessity and send it to the floor. EMS called for transport. TOC signing off.    Final next level of care: Other (comment) ([place]) Barriers to Discharge: No Barriers Identified   Patient Goals and CMS Choice Patient states their goals for this hospitalization and ongoing recovery are:: [facility] to [facility] CMS Medicare.gov Compare Post Acute Care list provided to:: Patient Choice offered to / list presented to : Patient  Discharge Placement              Patient chooses bed at: Other - please specify in the comment section below: ([place]) Patient to be transferred to facility by: EMS Name of family member notified: Carin Primrose Alanson Puls)   [phone] Patient and family notified of of transfer: 12/09/21  Discharge Plan and Services                                     Social Determinants of Health (SDOH) Interventions     Readmission Risk Interventions Readmission Risk Prevention Plan 11/01/2021 06/27/2021  Transportation Screening Complete Complete  HRI or [place] Complete Complete   Social Work Consult for [place] Planning/Counseling Complete Complete  Palliative Care Screening Not Applicable Not Applicable  Medication Review Press photographer) Complete Complete  Some recent data might be hidden

## 2021-12-10 DIAGNOSIS — G9341 Metabolic encephalopathy: Secondary | ICD-10-CM | POA: Diagnosis not present

## 2021-12-10 DIAGNOSIS — N179 Acute kidney failure, unspecified: Secondary | ICD-10-CM | POA: Diagnosis not present

## 2021-12-10 DIAGNOSIS — E870 Hyperosmolality and hypernatremia: Secondary | ICD-10-CM | POA: Diagnosis not present

## 2021-12-10 DIAGNOSIS — E876 Hypokalemia: Secondary | ICD-10-CM | POA: Diagnosis not present

## 2021-12-10 MED ORDER — METOPROLOL TARTRATE 25 MG PO TABS: 12.5000 mg | ORAL_TABLET | Freq: Two times a day (BID) | ORAL | 1 refills | Status: DC

## 2021-12-10 MED ORDER — METOPROLOL TARTRATE 25 MG PO TABS
12.5000 mg | ORAL_TABLET | Freq: Two times a day (BID) | ORAL | Status: DC
  Administered 2021-12-10 – 2021-12-12 (×5): 12.5 mg via ORAL
  Filled 2021-12-10 (×5): qty 1

## 2021-12-10 NOTE — Progress Notes (Signed)
CSW spoke with Jinny Blossom at [facility] EMS dispatch who to schedule transportation back to the family care home. Jinny Blossom states she will add patient to transportation list.  CSW spoke with patient's nephew Elberta Fortis to inform him of discharge plan - he is agreeable and will accept the patient upon her return.  Madilyn Fireman, MSW, LCSW Transitions of Care   Clinical Social Worker II [phone]

## 2021-12-10 NOTE — Plan of Care (Signed)

## 2021-12-10 NOTE — Discharge Summary (Signed)
Physician Discharge Summary   Patient: Joanna [name] MRN: [id number] DOB: [birth year]  Admit date:     12/03/2021  Discharge date: 12/10/21  Discharge Physician: Shanon Brow Naomy Esham   PCP: [name], MD   Recommendations at discharge:  [place] PATIENT  Discharge Diagnoses:    Hospital Course: 71 year old female who is a resident of a family care home, with history of hypertension, diabetes, chronic kidney disease stage IIIb, mild dementia, brought to the hospital with progressive encephalopathy.  Noted to be dehydrated with acute kidney injury, hypernatremia.  Imaging also indicated aspiration pneumonia.  She was started on IV unasyn.   She is on IV fluids for rehydration She was started on 1/2NS for hypernatremia.  This was changed to D5W when pt had hypoglycemia.  She was recently hospitalized from 10/31/21 to 11/02/21 due to ABLA and FOBT+.  During that hospitalization she underwent EGD which showed Two non-bleeding cratered gastric ulcers with a clean ulcer base (Forrest Class III) were found in the gastric antrum.   no presence of hematin or active bleeding was present.  She was transfused 2 units PRBC.  Hgb remained stable after transfusion Palliative medicine was consulted to assist and had [place] discussions with family.  Ultimately, it was decided the best disposition was to d/c back to ALF with palliative services.  Assessment and Plan: Acute metabolic encephalopathy -multifactorial including renal failure and hypernatremia due to dehydration and aspiration pneumonia -CT head and CTA head and neck did not show any acute findings -continue with IV fluids and monitor electrolytes/urine output -Since admission, appears to be making very slow improvements -2/23--A&O x 1; answers simple yes/no questions; not following commands 2/24--a&o X 2, answers simple yes/no questions, occasionally follows commands 2/25--a&o X 2, answers simple yes/no questions,  occasionally follows commands   Acute kidney injury on chronic kidney disease stage IIIb -Related to dehydration from decreased p.o. intake -Continue IV fluids  -Renal ultrasound without evidence of hydronephrosis -I suspect the initial creatinine value of 0.6 on admission was an erroneous lab result -Previous Baseline creatinine likely ranging between 1.2-1.5 -Admission creatinine 2.6 -serum creatinine 2.10 at time of d/c -likely has new renal baseline 2.0-2.1   Acute on chronic anemia -transfuse 2 units PRBC>>Hgb stable after transfusion -discussed with daughter>>does not want any further procedures -continue PPI bid Hgb 10.5 on day of d/c   Hypernatremia -Due to dehydration and poor p.o. intake -Continue hypotonic fluids >>switch to D5W -increase rate on IVF to 100cc/hr Improved--Na 141 on day of d/c   Hypoalbuminemia/anasarca -We will need to hydrate cautiously with IV fluids -She is prone to third space fluid which may lead to pulmonary edema and shortness of breath   Aspiration pneumonia -Noted on CT imaging -Continue Unasyn D#4 -d/c home with cefdinir and clinda x 3 more days   Hypothyroidism TSH 42, which has been trending down over the past 3 months from 59 She is continued on levothyroxine, but will change to IV since unable to take p.o. at this time   Type 2 diabetes -Continued on sliding scale insulin   Hypokalemia -repleted IV and po   Hypomagnesemia -repleted IV and po   Chronic neuromuscular deficits -Chronic left-sided spastic contractures -Patient has been chronically bedbound   Major neurocognitive Disorder -Family reports that she is normally awake, alert, able to participate in conversation appropriately -Her daughter also reports that over the past few weeks she has been having decreased p.o. intake, at times refusing to eat or  eating very little -Her mood otherwise appeared to be normal/engaged until she became encephalopathic -She had a  similar incident last year where she became extremely dehydrated, but then began to eat and her acute issues at that time resolved.  She was admitted to [facility]. -overall mental status gradually improving--A&O x 2 at time of d/c but does not carry on conversation -po intake remains poor -Palliative medicine re-consulted   Goals of care -During her last admission in September, she was seen by palliative care at that time and family had agreed to DNR status -This was reconfirmed with patient's daughter, that DNR is still in place -I mentioned that it would be helpful if palliative care follow in her hospital course as another layer of support and to help address goals of care>>no PEG -after [place] discussion with family, they decided to d/c pt back to ALF with hospice/palliative services      Consultants: PALLIATIVE Procedures performed: NONE  Disposition: Assisted living Diet recommendation:  Dysphagia type 1 THIN  Liquid  DISCHARGE MEDICATION: Allergies as of 12/10/2021       Reactions   Zoloft [sertraline Hcl] Other (See Comments)   Made the patient feel withdrawn and had bad dreams- "Allergic," per [facility]   Ivp Dye [iodinated Contrast Media] Rash, Other (See Comments)   "Allergic," per [facility]        Medication List     STOP taking these medications    acetaminophen 500 MG tablet Commonly known as: TYLENOL   albuterol 108 (90 Base) MCG/ACT inhaler Commonly known as: VENTOLIN HFA   fexofenadine 180 MG tablet Commonly known as: ALLEGRA       TAKE these medications    ascorbic acid 500 MG tablet Commonly known as: VITAMIN C Take 1 tablet (500 mg total) by mouth daily.   cefdinir 300 MG capsule Commonly known as: OMNICEF Take 1 capsule (300 mg total) by mouth daily. X 3 days   clindamycin 300 MG capsule Commonly known as: CLEOCIN Take 1 capsule (300 mg total) by mouth every 8 (eight) hours. X 3 days   DULoxetine 60 MG capsule Commonly known as:  Cymbalta Take 1 capsule (60 mg total) by mouth daily.   Ensure Plus High Protein Liqd Take 237 mLs by mouth 3 (three) times daily.   ferrous sulfate 325 (65 FE) MG tablet Take 325 mg by mouth daily with breakfast.   folic acid 1 MG tablet Commonly known as: FOLVITE TAKE (1) TABLET BY MOUTH ONCE DAILY. What changed:  how much to take how to take this when to take this additional instructions   levothyroxine 150 MCG tablet Commonly known as: SYNTHROID Take 1 tablet (150 mcg total) by mouth daily.   Magnesium 250 MG Tabs Take 1 tablet (250 mg total) by mouth 2 (two) times daily.   metoprolol tartrate 25 MG tablet Commonly known as: LOPRESSOR Take 0.5 tablets (12.5 mg total) by mouth 2 (two) times daily.   multivitamin with minerals Tabs tablet Take 1 tablet by mouth daily.   pantoprazole 40 MG tablet Commonly known as: Protonix Take 1 tablet (40 mg total) by mouth 2 (two) times daily.   rosuvastatin 20 MG tablet Commonly known as: CRESTOR Take 1 tablet by mouth daily (Stop Pravastatin) What changed:  how much to take how to take this when to take this additional instructions   thiamine 100 MG tablet Take 1 tablet (100 mg total) by mouth daily.   topiramate 50 MG tablet Commonly known  as: Topamax Take 1 tablet (50 mg total) by mouth 2 (two) times daily.   traZODone 50 MG tablet Commonly known as: DESYREL TAKE 1/2 TO 1 TABLET BY MOUTH EVERY NIGHT FOR SLEEP What changed:  how much to take how to take this when to take this additional instructions   vitamin B-12 1000 MCG tablet Commonly known as: CYANOCOBALAMIN TAKE (1) TABLET BY MOUTH ONCE DAILY. What changed: See the new instructions.   zinc sulfate 220 (50 Zn) MG capsule Take 1 capsule (220 mg total) by mouth daily.         Discharge Exam: Filed Weights   12/03/21 1326  Weight: 48 kg   HEENT:  [place]/AT, No thrush, no icterus CV:  RRR, no rub, no S3, no S4 Lung:  bibasilar rales, no wheeze, no  rhonchi Abd:  soft/+BS, NT Ext:  No edema, no lymphangitis, no synovitis, no rash   Condition at discharge: stable  The results of significant diagnostics from this hospitalization (including imaging, microbiology, ancillary and laboratory) are listed below for reference.   Imaging Studies: CT ANGIO HEAD NECK W WO CM  Result Date: 12/04/2021 CLINICAL DATA:  71 year old female with neurologic deficit, altered mental status. EXAM: CT ANGIOGRAPHY HEAD AND NECK TECHNIQUE: Multidetector CT imaging of the head and neck was performed using the standard protocol during bolus administration of intravenous contrast. Multiplanar CT image reconstructions and MIPs were obtained to evaluate the vascular anatomy. Carotid stenosis measurements (when applicable) are obtained utilizing NASCET criteria, using the distal internal carotid diameter as the denominator. RADIATION DOSE REDUCTION: This exam was performed according to the departmental dose-optimization program which includes automated exposure control, adjustment of the mA and/or kV according to patient size and/or use of iterative reconstruction technique. CONTRAST:  7mL OMNIPAQUE IOHEXOL 350 MG/ML SOLN COMPARISON:  Head CT 12/03/2021. Brain MRI 07/08/2021. Carotid ultrasound 04/20/2021. Chest CT 05/20/2021. FINDINGS: CT HEAD Brain: Scaphocephaly. Stable non contrast CT appearance of the brain. Patchy and confluent bilateral cerebral white matter hypodensity. Small area of cortical encephalomalacia in the right superior frontal gyrus. Calvarium and skull base: No acute osseous abnormality identified. Paranasal sinuses: Visualized paranasal sinuses and mastoids are clear. Orbits: No acute orbit or scalp soft tissue finding. CTA NECK Skeleton: Previous C4-C5 and C5-C6 ACDF with solid arthrodesis. Superimposed cervical spine degeneration. No acute osseous abnormality identified. Upper chest: Opacified bronchus intermedius with patchy and nodular peribronchial  opacity in the posterior right upper lobe and throughout the visible superior segment of the right lower lobe. Some upper lobe airway thickening and/or opacification. Negative left lung. Visible central pulmonary arteries appear intact. No superior mediastinal lymphadenopathy. Other neck: No acute neck soft tissue finding. Aortic arch: Tortuous aortic arch. Calcified aortic atherosclerosis. 3 vessel arch configuration. Right carotid system: Tortuous brachiocephalic artery and proximal right CCA without plaque or stenosis. Negative right carotid bifurcation. Tortuous right ICA distal to the bulb but no stenosis. Left carotid system: Negative aside from tortuosity. Vertebral arteries: Tortuous proximal right subclavian artery with mild calcified plaque and no stenosis. Normal right vertebral artery origin. Tortuous right vertebral artery appears somewhat dominant and is patent to the skull base without stenosis. Left vertebral artery negative aside from tortuosity, mildly non dominant. CTA HEAD Posterior circulation: Dominant right vertebral V4 segment is tortuous with calcified atherosclerosis but no stenosis. Negative left V4 segment. Normal PICA origins. Tortuous vertebrobasilar junction and basilar artery with minimal calcified plaque and no stenosis. Patent SCA and PCA origins. Posterior communicating arteries are diminutive or absent. Bilateral  PCA branches are normal aside from tortuosity. Anterior circulation: Both ICA siphons are patent. Mild siphon tortuosity. Mild calcified atherosclerosis and irregularity of the cavernous and supraclinoid segments but no significant stenosis. Patent carotid termini. Normal MCA and ACA origins. Mildly tortuous A1 segments. Normal anterior communicating artery. Bilateral ACA branches are within normal limits. Left MCA M1 segment bifurcates early without stenosis. Right MCA M1 and bifurcation are patent without stenosis. Bilateral MCA branches appear normal aside from  tortuosity. Venous sinuses: Early contrast timing but grossly patent. Anatomic variants: Dominant right vertebral artery. Review of the MIP images confirms the above findings IMPRESSION: 1. Negative for large vessel occlusion. Pronounced generalized arterial tortuosity in the head and neck. But mild for age atherosclerosis and no significant stenosis. 2. Abnormal Right Lung, with opacified bronchus intermedius, abnormal right upper and lower lobe peribronchial opacity. Constellation favors favor Multilobar Pneumonia Secondary To Aspiration. Follow-up Chest Radiographs may be valuable. 3. Stable CT appearance of the brain. Previous C4-C5 and C5-C6 ACDF with solid arthrodesis. 4. Aortic Atherosclerosis (ICD10-I70.0). Electronically Signed   By: Genevie Ann M.D.   On: 12/04/2021 08:57   DG Chest 1 View  Result Date: 12/03/2021 CLINICAL DATA:  Altered mental status EXAM: CHEST  1 VIEW COMPARISON:  07/09/2021 FINDINGS: The heart size and mediastinal contours are within normal limits. Both lungs are clear. The visualized skeletal structures are unremarkable. IMPRESSION: No acute abnormality of the lungs in AP portable projection. Electronically Signed   By: Delanna Ahmadi M.D.   On: 12/03/2021 14:05   CT HEAD WO CONTRAST  Result Date: 12/03/2021 CLINICAL DATA:  Mental status changes. EXAM: CT HEAD WITHOUT CONTRAST TECHNIQUE: Contiguous axial images were obtained from the base of the skull through the vertex without intravenous contrast. RADIATION DOSE REDUCTION: This exam was performed according to the departmental dose-optimization program which includes automated exposure control, adjustment of the mA and/or kV according to patient size and/or use of iterative reconstruction technique. COMPARISON:  07/07/2021 FINDINGS: Brain: There is no evidence for acute hemorrhage, hydrocephalus, mass lesion, or abnormal extra-axial fluid collection. No definite CT evidence for acute infarction. Patchy low attenuation in the deep  hemispheric and periventricular white matter is nonspecific, but likely reflects chronic microvascular ischemic demyelination. Vascular: No hyperdense vessel or unexpected calcification. Skull: No evidence for fracture. No worrisome lytic or sclerotic lesion. Sinuses/Orbits: The visualized paranasal sinuses and mastoid air cells are clear. Visualized portions of the globes and intraorbital fat are unremarkable. Other: None. IMPRESSION: 1. No acute intracranial abnormality. 2. Chronic small vessel ischemic disease. Electronically Signed   By: Misty Stanley M.D.   On: 12/03/2021 14:03   US RENAL  Result Date: 12/05/2021 CLINICAL DATA:  AKI EXAM: RENAL / URINARY TRACT ULTRASOUND COMPLETE COMPARISON:  Renal ultrasound 07/07/2021, CT abdomen/pelvis 11/07/2019 FINDINGS: Right Kidney: Renal measurements: 9.3 cm x 4.3 cm x 5.6 cm = volume: 117 mL. Parenchymal echogenicity is increased. There is no hydronephrosis. Left Kidney: Renal measurements: 8.4 cm x 4.5 cm x 4.6 cm = volume: 91 mL. Parenchymal echogenicity is increased. There is no hydronephrosis. Bladder: Appears normal for degree of bladder distention. Other: None. IMPRESSION: Increased parenchymal echogenicity bilaterally consistent with medical renal disease. No hydronephrosis. Electronically Signed   By: Valetta Mole M.D.   On: 12/05/2021 11:32   DG Chest Port 1 View  Result Date: 12/09/2021 CLINICAL DATA:  Progressive encephalopathy, bleeding from the PICC line. EXAM: PORTABLE CHEST 1 VIEW COMPARISON:  Portable chest 12/03/2021. FINDINGS: The heart is slightly enlarged. No  vascular congestion is seen. There is aortic atherosclerosis with mild tortuosity and stable mediastinum. The lungs are generally clear as far seen. The patient's right hand superimposes over the lower right chest and partially obscures the right lower lung field. A right PICC has been inserted since prior study. The tip is in the upper right atrium. There is no pneumothorax. Multiple  overlying monitor wires. Slight thoracic dextroscoliosis. IMPRESSION: Visualized lungs clear with partial obscuration of the right lower lung field by the patient's right hand. Interval right PICC insertion with tip in the upper right atrium. Aortic atherosclerosis. Electronically Signed   By: Telford Nab M.D.   On: 12/09/2021 03:26   [place] EKG SITE RITE  Result Date: 12/05/2021 If [facility] image not attached, placement could not be confirmed due to current cardiac rhythm.   Microbiology: Results for orders placed or performed during the hospital encounter of 12/03/21  Urine Culture     Status: None   Collection Time: 12/03/21  1:37 PM   Specimen: In/Out Cath Urine  Result Value Ref Range Status   Specimen Description   Final    IN/OUT CATH URINE Performed at [facility], [address], [place], [place] [address]    Special Requests   Final    Normal Performed at [facility], [address] [place], [place] [address]    Culture   Final    NO GROWTH Performed at [facility], [place] [address] [place], [place] [address]    Report Status 12/05/2021 FINAL  Final  Resp Panel by RT-PCR (Flu A&B, Covid) Nasopharyngeal Swab     Status: None   Collection Time: 12/03/21  5:39 PM   Specimen: Nasopharyngeal Swab; Nasopharyngeal(NP) swabs in vial transport medium  Result Value Ref Range Status   SARS Coronavirus 2 by RT PCR NEGATIVE NEGATIVE Final    Comment: (NOTE) SARS-CoV-2 target nucleic acids are NOT DETECTED.  The SARS-CoV-2 RNA is generally detectable in upper respiratory specimens during the acute phase of infection. The lowest concentration of SARS-CoV-2 viral copies this assay can detect is 138 copies/mL. A negative result does not preclude SARS-Cov-2 infection and should not be used as the sole basis for treatment or other patient management decisions. A negative result may occur with  improper specimen collection/handling, submission of specimen other than  nasopharyngeal swab, presence of viral mutation(s) within the areas targeted by this assay, and inadequate number of viral copies(<138 copies/mL). A negative result must be combined with clinical observations, patient history, and epidemiological information. The expected result is Negative.  Fact Sheet for Patients:  EntrepreneurPulse.com.au  Fact Sheet for Healthcare Providers:  IncredibleEmployment.be  This test is no t yet approved or cleared by the [place] FDA and  has been authorized for detection and/or diagnosis of SARS-CoV-2 by FDA under an Emergency Use Authorization (EUA). This EUA will remain  in effect (meaning this test can be used) for the duration of the COVID-19 declaration under Section 564(b)(1) of the Act, 21 U.S.C.section 360bbb-3(b)(1), unless the authorization is terminated  or revoked sooner.       Influenza A by PCR NEGATIVE NEGATIVE Final   Influenza B by PCR NEGATIVE NEGATIVE Final    Comment: (NOTE) The Xpert Xpress SARS-CoV-2/FLU/RSV plus assay is intended as an aid in the diagnosis of influenza from Nasopharyngeal swab specimens and should not be used as a sole basis for treatment. Nasal washings and aspirates are unacceptable for Xpert Xpress SARS-CoV-2/FLU/RSV testing.  Fact Sheet for Patients:  EntrepreneurPulse.com.au  Fact Sheet for Healthcare Providers: IncredibleEmployment.be  This test is not yet approved or cleared by the [place] FDA and has been authorized for detection and/or diagnosis of SARS-CoV-2 by FDA under an Emergency Use Authorization (EUA). This EUA will remain in effect (meaning this test can be used) for the duration of the COVID-19 declaration under Section 564(b)(1) of the Act, 21 U.S.C. section 360bbb-3(b)(1), unless the authorization is terminated or revoked.  Performed at [facility], [address], [place], [place] [address]    Culture, blood (Routine X 2) w Reflex to ID Panel     Status: Abnormal   Collection Time: 12/03/21  6:53 PM   Specimen: BLOOD RIGHT ARM  Result Value Ref Range Status   Specimen Description   Final    BLOOD RIGHT ARM BOTTLES DRAWN AEROBIC ONLY Performed at [facility], [address], [place], [place] [address]    Special Requests   Final    Blood Culture results may not be optimal due to an inadequate volume of blood received in culture bottles Performed at [facility], [address] [place], [place] [address]    Culture  Setup Time   Final    Gram Stain Report Called to,Read Back By and Verified With: VALDESE DILDY @ 1245 ON 12/06/21 C VARNER CORRECTED RESULTS GRAM POSITIVE RODS PREVIOUSLY REPORTED AS: GRAM POSITIVE COCCI CORRECTED RESULTS CALLED TO: PHARMD G.COFFEE AT 8099 ON 12/07/2021 BY T.SAAD.    Culture (A)  Final    DIPHTHEROIDS(CORYNEBACTERIUM SPECIES) Standardized susceptibility testing for this organism is not available. Performed at [facility], [place] [address], [place], [place] [address]    Report Status 12/07/2021 FINAL  Final  Culture, blood (Routine X 2) w Reflex to ID Panel     Status: None   Collection Time: 12/03/21  6:54 PM   Specimen: BLOOD RIGHT WRIST  Result Value Ref Range Status   Specimen Description BLOOD RIGHT WRIST BOTTLES DRAWN AEROBIC ONLY  Final   Special Requests   Final    Blood Culture results may not be optimal due to an inadequate volume of blood received in culture bottles   Culture   Final    NO GROWTH 5 DAYS Performed at [facility], [address], [place], [place] [address]    Report Status 12/08/2021 FINAL  Final    Labs: CBC: Recent Labs  Lab 12/03/21 1353 12/04/21 0423 12/05/21 0513 12/06/21 0346 12/07/21 0623 12/08/21 0452 12/08/21 0725 12/09/21 0438  WBC 5.8   < > 7.5 11.5* 15.1* 13.0*  --  11.7*  NEUTROABS 4.1  --   --   --   --   --   --   --   HGB 12.6   < > 8.5* 7.9* 8.3* 6.7* 6.6* 7.9*  HCT 36.3   < >  29.4* 27.7* 28.3* 22.0* 21.8* 25.8*  MCV 96.5   < > 88.8 90.5 92.5 89.1  --  87.5  PLT 180   < > 185 163 148* 117*  --  106*   < > = values in this interval not displayed.   Basic Metabolic Panel: Recent Labs  Lab 12/06/21 0346 12/06/21 0848 12/07/21 0623 12/08/21 0452 12/09/21 0150  NA 140 147* 148* 144 140  K 3.8 3.7 3.9 3.3* 4.2  CL 116* 120* 119* 117* 114*  CO2 18* 20* 19* 19* 16*  GLUCOSE 500* 151* 88 130* 124*  BUN 67* 67* 63* 61* 52*  CREATININE 2.04* 2.16*  2.21* 2.34* 2.10*  CALCIUM 9.4 10.0 9.2 8.5* 8.6*  MG 2.0  --  1.9 1.7 1.6*  PHOS 1.5* 1.6* 5.0*  --   --    Liver Function Tests: Recent Labs  Lab 12/04/21 1319 12/05/21 0513 12/06/21 0346 12/07/21 0623 12/08/21 0452  AST 18 11* 11* 111* 38  ALT 17 14 15  109* 72*  ALKPHOS 74 66 68 104 97  BILITOT 0.4 0.5 0.3 0.4 0.6  PROT 6.8 5.8* 5.3* 5.9* 5.0*  ALBUMIN 2.7* 2.3* 2.0* 2.1* 1.8*   CBG: Recent Labs  Lab 12/07/21 0754 12/07/21 1210 12/07/21 1744 12/08/21 0032 12/08/21 0952  GLUCAP 86 134* 74 247* 139*    Discharge time spent: greater than 30 minutes.  Signed: Orson Eva, MD [facility] 12/10/2021

## 2021-12-11 DIAGNOSIS — N179 Acute kidney failure, unspecified: Secondary | ICD-10-CM | POA: Diagnosis not present

## 2021-12-11 DIAGNOSIS — G9340 Encephalopathy, unspecified: Secondary | ICD-10-CM | POA: Diagnosis not present

## 2021-12-11 DIAGNOSIS — G9341 Metabolic encephalopathy: Secondary | ICD-10-CM | POA: Diagnosis not present

## 2021-12-11 LAB — TYPE AND SCREEN
ABO/RH(D): O NEG
Antibody Screen: POSITIVE
DAT, IgG: NEGATIVE
Unit division: 0
Unit division: 0

## 2021-12-11 LAB — BPAM RBC
Blood Product Expiration Date: 202303132359
Blood Product Expiration Date: 202303262359
ISSUE DATE / TIME: 202302231804
ISSUE DATE / TIME: 202302232101
Unit Type and Rh: 9500
Unit Type and Rh: 9500

## 2021-12-11 NOTE — Progress Notes (Signed)
PROGRESS NOTE  Joanna [name] HCW:[id number] DOB: 01-20-1951 DOA: 12/03/2021 PCP: [name], MD  Brief History:  71 year old female who is a resident of a family care home, with history of hypertension, diabetes, chronic kidney disease stage IIIb, mild dementia, brought to the hospital with progressive encephalopathy.  Noted to be dehydrated with acute kidney injury, hypernatremia.  Imaging also indicated aspiration pneumonia.  She was started on IV unasyn.   She is on IV fluids for rehydration She was started on 1/2NS for hypernatremia.  This was changed to D5W when pt had hypoglycemia.  She was recently hospitalized from 10/31/21 to 11/02/21 due to ABLA and FOBT+.  During that hospitalization she underwent EGD which showed Two non-bleeding cratered gastric ulcers with a clean ulcer base (Forrest Class III) were found in the gastric antrum.   no presence of hematin or active bleeding was present.  She was transfused 2 units PRBC. Palliative medicine was consulted to assist.    Assessment/Plan: Acute metabolic encephalopathy -multifactorial including renal failure and hypernatremia due to dehydration and aspiration pneumonia -CT head and CTA head and neck did not show any acute findings -continue with IV fluids and monitor electrolytes/urine output -Since admission, appears to be making very slow improvements -2/26--A&O x 2; answers simple yes/no questions; occasionally follows commands   Acute kidney injury on chronic kidney disease stage IIIb -Related to dehydration from decreased p.o. intake -Continue IV fluids  -Renal ultrasound without evidence of hydronephrosis -I suspect the initial creatinine value of 0.6 on admission was an erroneous lab result -Baseline creatinine likely ranging between 1.2-1.5 -Admission creatinine 2.6 -likely has new renal baseline 2.0-2.1   Acute on chronic anemia -transfuse 2 units PRBC -discussed with daughter>>does not want any further  procedures -continue PPI bid -Hgb 10.5 on day of d/c   Hypernatremia -Due to dehydration and poor p.o. intake -Continue hypotonic fluids >>switch to D5W -increased rate on IVF to 100cc/hr -improved   Hypoalbuminemia/anasarca -We will need to hydrate cautiously with IV fluids -She is prone to third space fluid which may lead to pulmonary edema and shortness of breath   Aspiration pneumonia -Noted on CT imaging -Continue Unasyn D#4   Hypothyroidism TSH 42, which has been trending down over the past 3 months from 59 She is continued on levothyroxine, but will change to IV since unable to take p.o. at this time -improved mental status--back to po at time of dc   Type 2 diabetes -Continued on sliding scale insulin -allow for liberalized controle   Hypokalemia -replete -mag 1.7   Chronic neuromuscular deficits -Chronic left-sided spastic contractures -Patient has been chronically bedbound   Major neurocognitive Disorder -Family reports that she is normally awake, alert, able to participate in conversation appropriately -Her daughter also reports that over the past few weeks she has been having decreased p.o. intake, at times refusing to eat or eating very little -Her mood otherwise appeared to be normal/engaged until she became encephalopathic -She had a similar incident last year where she became extremely dehydrated, but then began to eat and her acute issues at that time resolved.  She was admitted to [facility] 06/2021. -We will need to reassess mental status once uremia and other electrolyte abnormalities have been corrected -If she continues to refuse any p.o. intake or does not take in enough p.o. intake, will need to readdress goals of care with patient's family -po intake remains poor -Palliative medicine re-consulted  Goals of care -During her last admission in September, she was seen by palliative care at that time and family had agreed to DNR status -This was  reconfirmed with patient's daughter, that DNR is still in place -I mentioned that it would be helpful if palliative care follow in her hospital course as another layer of support and to help address goals of care>>no PEG             Family Communication:   no Family at bedside  Consultants:  palliative  Code Status:  DNR  DVT Prophylaxis:  [place] Heparin / [place] Lovenox   Procedures: As Listed in Progress Note Above  Antibiotics: None  RN Pressure Injury Documentation: Pressure Injury 12/03/21 Sacrum Left;Medial Stage 2 -  Partial thickness loss of dermis presenting as a shallow open injury with a red, pink wound bed without slough. 4cm x 5 cm x 0 cm wound - pink/red wound base with some black spots medially, no draina (Active)  12/03/21 1830  Location: Sacrum  Location Orientation: Left;Medial  Staging: Stage 2 -  Partial thickness loss of dermis presenting as a shallow open injury with a red, pink wound bed without slough.  Wound Description (Comments): 4cm x 5 cm x 0 cm wound - pink/red wound base with some black spots medially, no drainage noted  Present on Admission: Yes     Pressure Injury 12/03/21 Buttocks Right;Medial Stage 2 -  Partial thickness loss of dermis presenting as a shallow open injury with a red, pink wound bed without slough. 1cm x 1 cm x 0 cm, no drainage (Active)  12/03/21 1830  Location: Buttocks  Location Orientation: Right;Medial  Staging: Stage 2 -  Partial thickness loss of dermis presenting as a shallow open injury with a red, pink wound bed without slough.  Wound Description (Comments): 1cm x 1 cm x 0 cm, no drainage  Present on Admission: Yes     Pressure Injury 12/03/21 Heel Right;Medial Deep Tissue Pressure Injury - Purple or maroon localized area of discolored intact skin or blood-filled blister due to damage of underlying soft tissue from pressure and/or shear. 0.5cm x 0.5 cm (Active)  12/03/21 1830  Location: Heel  Location Orientation:  Right;Medial  Staging: Deep Tissue Pressure Injury - Purple or maroon localized area of discolored intact skin or blood-filled blister due to damage of underlying soft tissue from pressure and/or shear.  Wound Description (Comments): 0.5cm x 0.5 cm  Present on Admission: Yes        Subjective: Denies cp, sob, abd pain, n/v.  Remainder unobtainable due to mentation  Objective: Vitals:   12/10/21 1449 12/10/21 2017 12/11/21 0443 12/11/21 1524  BP: 134/89 (!) 146/85 (!) 147/81 (!) 156/104  Pulse: 90 97 95 90  Resp: 20 16 18 20   Temp: 98 F (36.7 C) (!) 97.3 F (36.3 C) 98.9 F (37.2 C) 97.7 F (36.5 C)  TempSrc: Axillary   Axillary  SpO2: 100% 100% 100% 100%  Weight:      Height:        Intake/Output Summary (Last 24 hours) at 12/11/2021 1757 Last data filed at 12/11/2021 1137 Gross per 24 hour  Intake 2122.61 ml  Output 1200 ml  Net 922.61 ml   Weight change:  Exam:  General:  Pt is alert,  not in acute distress HEENT: No icterus, No thrush, No neck mass, /AT Cardiovascular: RRR, S1/S2, no rubs, no gallops Respiratory:bibasilar crackles. No wheeze Abdomen: Soft/+BS, non tender, non distended, no guarding Extremities:  trace LEedema, No lymphangitis, No petechiae, No rashes, no synovitis   Data Reviewed: I have personally reviewed following labs and imaging studies Basic Metabolic Panel: Recent Labs  Lab 12/06/21 0346 12/06/21 0848 12/07/21 0623 12/08/21 0452 12/09/21 0150  NA 140 147* 148* 144 140  K 3.8 3.7 3.9 3.3* 4.2  CL 116* 120* 119* 117* 114*  CO2 18* 20* 19* 19* 16*  GLUCOSE 500* 151* 88 130* 124*  BUN 67* 67* 63* 61* 52*  CREATININE 2.04* 2.16* 2.21* 2.34* 2.10*  CALCIUM 9.4 10.0 9.2 8.5* 8.6*  MG 2.0  --  1.9 1.7 1.6*  PHOS 1.5* 1.6* 5.0*  --   --    Liver Function Tests: Recent Labs  Lab 12/05/21 0513 12/06/21 0346 12/07/21 0623 12/08/21 0452  AST 11* 11* 111* 38  ALT 14 15 109* 72*  ALKPHOS 66 68 104 97  BILITOT 0.5 0.3 0.4 0.6   PROT 5.8* 5.3* 5.9* 5.0*  ALBUMIN 2.3* 2.0* 2.1* 1.8*   No results for input(s): LIPASE, AMYLASE in the last 168 hours. No results for input(s): AMMONIA in the last 168 hours. Coagulation Profile: No results for input(s): INR, PROTIME in the last 168 hours. CBC: Recent Labs  Lab 12/05/21 0513 12/06/21 0346 12/07/21 0623 12/08/21 0452 12/08/21 0725 12/09/21 0438  WBC 7.5 11.5* 15.1* 13.0*  --  11.7*  HGB 8.5* 7.9* 8.3* 6.7* 6.6* 7.9*  HCT 29.4* 27.7* 28.3* 22.0* 21.8* 25.8*  MCV 88.8 90.5 92.5 89.1  --  87.5  PLT 185 163 148* 117*  --  106*   Cardiac Enzymes: No results for input(s): CKTOTAL, CKMB, CKMBINDEX, TROPONINI in the last 168 hours. BNP: Invalid input(s): POCBNP CBG: Recent Labs  Lab 12/07/21 0754 12/07/21 1210 12/07/21 1744 12/08/21 0032 12/08/21 0952  GLUCAP 86 134* 74 247* 139*   HbA1C: No results for input(s): HGBA1C in the last 72 hours. Urine analysis:    Component Value Date/Time   COLORURINE BROWN (A) 12/08/2021 2256   APPEARANCEUR CLOUDY (A) 12/08/2021 2256   LABSPEC 1.012 12/08/2021 2256   PHURINE 6.0 12/08/2021 2256   GLUCOSEU NEGATIVE 12/08/2021 2256   HGBUR LARGE (A) 12/08/2021 2256   BILIRUBINUR NEGATIVE 12/08/2021 2256   KETONESUR NEGATIVE 12/08/2021 2256   PROTEINUR NEGATIVE 12/08/2021 2256   NITRITE NEGATIVE 12/08/2021 2256   LEUKOCYTESUR MODERATE (A) 12/08/2021 2256   Sepsis Labs: @LABRCNTIP (procalcitonin:4,lacticidven:4) ) Recent Results (from the past 240 hour(s))  Urine Culture     Status: None   Collection Time: 12/03/21  1:37 PM   Specimen: In/Out Cath Urine  Result Value Ref Range Status   Specimen Description   Final    IN/OUT CATH URINE Performed at [facility], [address], [place], [place] [address]    Special Requests   Final    Normal Performed at [facility], [address], [place], [place] [address]    Culture   Final    NO GROWTH Performed at [facility], [place] [address], [place], [place]  [address]    Report Status 12/05/2021 FINAL  Final  Resp Panel by RT-PCR (Flu A&B, Covid) Nasopharyngeal Swab     Status: None   Collection Time: 12/03/21  5:39 PM   Specimen: Nasopharyngeal Swab; Nasopharyngeal(NP) swabs in vial transport medium  Result Value Ref Range Status   SARS Coronavirus 2 by RT PCR NEGATIVE NEGATIVE Final    Comment: (NOTE) SARS-CoV-2 target nucleic acids are NOT DETECTED.  The SARS-CoV-2 RNA is generally detectable in upper  respiratory specimens during the acute phase of infection. The lowest concentration of SARS-CoV-2 viral copies this assay can detect is 138 copies/mL. A negative result does not preclude SARS-Cov-2 infection and should not be used as the sole basis for treatment or other patient management decisions. A negative result may occur with  improper specimen collection/handling, submission of specimen other than nasopharyngeal swab, presence of viral mutation(s) within the areas targeted by this assay, and inadequate number of viral copies(<138 copies/mL). A negative result must be combined with clinical observations, patient history, and epidemiological information. The expected result is Negative.  Fact Sheet for Patients:  EntrepreneurPulse.com.au  Fact Sheet for Healthcare Providers:  IncredibleEmployment.be  This test is no t yet approved or cleared by the [place] FDA and  has been authorized for detection and/or diagnosis of SARS-CoV-2 by FDA under an Emergency Use Authorization (EUA). This EUA will remain  in effect (meaning this test can be used) for the duration of the COVID-19 declaration under Section 564(b)(1) of the Act, 21 U.S.C.section 360bbb-3(b)(1), unless the authorization is terminated  or revoked sooner.       Influenza A by PCR NEGATIVE NEGATIVE Final   Influenza B by PCR NEGATIVE NEGATIVE Final    Comment: (NOTE) The Xpert Xpress SARS-CoV-2/FLU/RSV plus assay is intended as an  aid in the diagnosis of influenza from Nasopharyngeal swab specimens and should not be used as a sole basis for treatment. Nasal washings and aspirates are unacceptable for Xpert Xpress SARS-CoV-2/FLU/RSV testing.  Fact Sheet for Patients: EntrepreneurPulse.com.au  Fact Sheet for Healthcare Providers: IncredibleEmployment.be  This test is not yet approved or cleared by the [place] FDA and has been authorized for detection and/or diagnosis of SARS-CoV-2 by FDA under an Emergency Use Authorization (EUA). This EUA will remain in effect (meaning this test can be used) for the duration of the COVID-19 declaration under Section 564(b)(1) of the Act, 21 U.S.C. section 360bbb-3(b)(1), unless the authorization is terminated or revoked.  Performed at [facility], [address], [place], [place] [address]   Culture, blood (Routine X 2) w Reflex to ID Panel     Status: Abnormal   Collection Time: 12/03/21  6:53 PM   Specimen: BLOOD RIGHT ARM  Result Value Ref Range Status   Specimen Description   Final    BLOOD RIGHT ARM BOTTLES DRAWN AEROBIC ONLY Performed at [facility], [address], [place], [place] [address]    Special Requests   Final    Blood Culture results may not be optimal due to an inadequate volume of blood received in culture bottles Performed at [facility], [address] [place], [place] [address]    Culture  Setup Time   Final    Gram Stain Report Called to,Read Back By and Verified With: VALDESE DILDY @ 3810 ON 12/06/21 C VARNER CORRECTED RESULTS GRAM POSITIVE RODS PREVIOUSLY REPORTED AS: GRAM POSITIVE COCCI CORRECTED RESULTS CALLED TO: PHARMD G.COFFEE AT 1751 ON 12/07/2021 BY T.SAAD.    Culture (A)  Final    DIPHTHEROIDS(CORYNEBACTERIUM SPECIES) Standardized susceptibility testing for this organism is not available. Performed at [facility], [place] [address], [place], [place] [address]    Report Status 12/07/2021  FINAL  Final  Culture, blood (Routine X 2) w Reflex to ID Panel     Status: None   Collection Time: 12/03/21  6:54 PM   Specimen: BLOOD RIGHT WRIST  Result Value Ref Range Status   Specimen Description BLOOD RIGHT WRIST BOTTLES DRAWN  AEROBIC ONLY  Final   Special Requests   Final    Blood Culture results may not be optimal due to an inadequate volume of blood received in culture bottles   Culture   Final    NO GROWTH 5 DAYS Performed at [facility], [address] [place], [place] [address]    Report Status 12/08/2021 FINAL  Final     Scheduled Meds:  sodium chloride   Intravenous Once   aspirin  300 mg Rectal Daily   cefdinir  300 mg Oral Daily   Chlorhexidine Gluconate Cloth  6 each Topical Daily   clindamycin  300 mg Oral Q8H   feeding supplement  237 mL Oral BID BM   levothyroxine  100 mcg Intravenous Daily   metoprolol tartrate  12.5 mg Oral BID   pantoprazole (PROTONIX) IV  40 mg Intravenous Q12H   sodium chloride flush  10-40 mL Intracatheter Q12H   sodium chloride flush  3 mL Intravenous Q12H   sodium chloride flush  3 mL Intravenous Q12H   thiamine injection  100 mg Intravenous Daily   Continuous Infusions:  sodium chloride Stopped (12/08/21 1628)    Procedures/Studies: CT ANGIO HEAD NECK W WO CM  Result Date: 12/04/2021 CLINICAL DATA:  71 year old female with neurologic deficit, altered mental status. EXAM: CT ANGIOGRAPHY HEAD AND NECK TECHNIQUE: Multidetector CT imaging of the head and neck was performed using the standard protocol during bolus administration of intravenous contrast. Multiplanar CT image reconstructions and MIPs were obtained to evaluate the vascular anatomy. Carotid stenosis measurements (when applicable) are obtained utilizing NASCET criteria, using the distal internal carotid diameter as the denominator. RADIATION DOSE REDUCTION: This exam was performed according to the departmental dose-optimization program which includes automated exposure  control, adjustment of the mA and/or kV according to patient size and/or use of iterative reconstruction technique. CONTRAST:  52mL OMNIPAQUE IOHEXOL 350 MG/ML SOLN COMPARISON:  Head CT 12/03/2021. Brain MRI 07/08/2021. Carotid ultrasound 04/20/2021. Chest CT 05/20/2021. FINDINGS: CT HEAD Brain: Scaphocephaly. Stable non contrast CT appearance of the brain. Patchy and confluent bilateral cerebral white matter hypodensity. Small area of cortical encephalomalacia in the right superior frontal gyrus. Calvarium and skull base: No acute osseous abnormality identified. Paranasal sinuses: Visualized paranasal sinuses and mastoids are clear. Orbits: No acute orbit or scalp soft tissue finding. CTA NECK Skeleton: Previous C4-C5 and C5-C6 ACDF with solid arthrodesis. Superimposed cervical spine degeneration. No acute osseous abnormality identified. Upper chest: Opacified bronchus intermedius with patchy and nodular peribronchial opacity in the posterior right upper lobe and throughout the visible superior segment of the right lower lobe. Some upper lobe airway thickening and/or opacification. Negative left lung. Visible central pulmonary arteries appear intact. No superior mediastinal lymphadenopathy. Other neck: No acute neck soft tissue finding. Aortic arch: Tortuous aortic arch. Calcified aortic atherosclerosis. 3 vessel arch configuration. Right carotid system: Tortuous brachiocephalic artery and proximal right CCA without plaque or stenosis. Negative right carotid bifurcation. Tortuous right ICA distal to the bulb but no stenosis. Left carotid system: Negative aside from tortuosity. Vertebral arteries: Tortuous proximal right subclavian artery with mild calcified plaque and no stenosis. Normal right vertebral artery origin. Tortuous right vertebral artery appears somewhat dominant and is patent to the skull base without stenosis. Left vertebral artery negative aside from tortuosity, mildly non dominant. CTA HEAD  Posterior circulation: Dominant right vertebral V4 segment is tortuous with calcified atherosclerosis but no stenosis. Negative left V4 segment. Normal PICA origins. Tortuous vertebrobasilar junction and basilar artery with minimal  calcified plaque and no stenosis. Patent SCA and PCA origins. Posterior communicating arteries are diminutive or absent. Bilateral PCA branches are normal aside from tortuosity. Anterior circulation: Both ICA siphons are patent. Mild siphon tortuosity. Mild calcified atherosclerosis and irregularity of the cavernous and supraclinoid segments but no significant stenosis. Patent carotid termini. Normal MCA and ACA origins. Mildly tortuous A1 segments. Normal anterior communicating artery. Bilateral ACA branches are within normal limits. Left MCA M1 segment bifurcates early without stenosis. Right MCA M1 and bifurcation are patent without stenosis. Bilateral MCA branches appear normal aside from tortuosity. Venous sinuses: Early contrast timing but grossly patent. Anatomic variants: Dominant right vertebral artery. Review of the MIP images confirms the above findings IMPRESSION: 1. Negative for large vessel occlusion. Pronounced generalized arterial tortuosity in the head and neck. But mild for age atherosclerosis and no significant stenosis. 2. Abnormal Right Lung, with opacified bronchus intermedius, abnormal right upper and lower lobe peribronchial opacity. Constellation favors favor Multilobar Pneumonia Secondary To Aspiration. Follow-up Chest Radiographs may be valuable. 3. Stable CT appearance of the brain. Previous C4-C5 and C5-C6 ACDF with solid arthrodesis. 4. Aortic Atherosclerosis (ICD10-I70.0). Electronically Signed   By: Genevie Ann M.D.   On: 12/04/2021 08:57   DG Chest 1 View  Result Date: 12/03/2021 CLINICAL DATA:  Altered mental status EXAM: CHEST  1 VIEW COMPARISON:  07/09/2021 FINDINGS: The heart size and mediastinal contours are within normal limits. Both lungs are clear.  The visualized skeletal structures are unremarkable. IMPRESSION: No acute abnormality of the lungs in AP portable projection. Electronically Signed   By: Delanna Ahmadi M.D.   On: 12/03/2021 14:05   CT HEAD WO CONTRAST  Result Date: 12/03/2021 CLINICAL DATA:  Mental status changes. EXAM: CT HEAD WITHOUT CONTRAST TECHNIQUE: Contiguous axial images were obtained from the base of the skull through the vertex without intravenous contrast. RADIATION DOSE REDUCTION: This exam was performed according to the departmental dose-optimization program which includes automated exposure control, adjustment of the mA and/or kV according to patient size and/or use of iterative reconstruction technique. COMPARISON:  07/07/2021 FINDINGS: Brain: There is no evidence for acute hemorrhage, hydrocephalus, mass lesion, or abnormal extra-axial fluid collection. No definite CT evidence for acute infarction. Patchy low attenuation in the deep hemispheric and periventricular white matter is nonspecific, but likely reflects chronic microvascular ischemic demyelination. Vascular: No hyperdense vessel or unexpected calcification. Skull: No evidence for fracture. No worrisome lytic or sclerotic lesion. Sinuses/Orbits: The visualized paranasal sinuses and mastoid air cells are clear. Visualized portions of the globes and intraorbital fat are unremarkable. Other: None. IMPRESSION: 1. No acute intracranial abnormality. 2. Chronic small vessel ischemic disease. Electronically Signed   By: Misty Stanley M.D.   On: 12/03/2021 14:03   US RENAL  Result Date: 12/05/2021 CLINICAL DATA:  AKI EXAM: RENAL / URINARY TRACT ULTRASOUND COMPLETE COMPARISON:  Renal ultrasound 07/07/2021, CT abdomen/pelvis 11/07/2019 FINDINGS: Right Kidney: Renal measurements: 9.3 cm x 4.3 cm x 5.6 cm = volume: 117 mL. Parenchymal echogenicity is increased. There is no hydronephrosis. Left Kidney: Renal measurements: 8.4 cm x 4.5 cm x 4.6 cm = volume: 91 mL. Parenchymal  echogenicity is increased. There is no hydronephrosis. Bladder: Appears normal for degree of bladder distention. Other: None. IMPRESSION: Increased parenchymal echogenicity bilaterally consistent with medical renal disease. No hydronephrosis. Electronically Signed   By: Valetta Mole M.D.   On: 12/05/2021 11:32   DG Chest Port 1 View  Result Date: 12/09/2021 CLINICAL DATA:  Progressive encephalopathy, bleeding from the PICC  line. EXAM: PORTABLE CHEST 1 VIEW COMPARISON:  Portable chest 12/03/2021. FINDINGS: The heart is slightly enlarged. No vascular congestion is seen. There is aortic atherosclerosis with mild tortuosity and stable mediastinum. The lungs are generally clear as far seen. The patient's right hand superimposes over the lower right chest and partially obscures the right lower lung field. A right PICC has been inserted since prior study. The tip is in the upper right atrium. There is no pneumothorax. Multiple overlying monitor wires. Slight thoracic dextroscoliosis. IMPRESSION: Visualized lungs clear with partial obscuration of the right lower lung field by the patient's right hand. Interval right PICC insertion with tip in the upper right atrium. Aortic atherosclerosis. Electronically Signed   By: Telford Nab M.D.   On: 12/09/2021 03:26   [place] EKG SITE RITE  Result Date: 12/05/2021 If [facility] image not attached, placement could not be confirmed due to current cardiac rhythm.   Orson Eva, DO  [facility]  If 7PM-7AM, please contact night-coverage [email or web address] Password [email or web address] 12/11/2021, 5:57 PM   LOS: 7 days

## 2021-12-11 NOTE — TOC Progression Note (Signed)
Transition of Care [facility]) - Progression Note    Patient Details  Name: Joanna [name] MRN: [id number] Date of Birth: 04-Nov-1950  Transition of Care [facility] CM/SW Contact  Salome Arnt, [place] Phone Number: 12/11/2021, 10:49 AM  Clinical Narrative:  TOC confirmed pt is on EMS transport list.      Expected Discharge Plan:  [facility]) Barriers to Discharge: No Barriers Identified  Expected Discharge Plan and Services Expected Discharge Plan:  [facility])         Expected Discharge Date: 12/09/21                                     Social Determinants of Health ([place]) Interventions    Readmission Risk Interventions Readmission Risk Prevention Plan 11/01/2021 06/27/2021  Transportation Screening Complete Complete  HRI or Home Care Consult Complete Complete  Social Work Consult for [place] Planning/Counseling Complete Complete  Palliative Care Screening Not Applicable Not Applicable  Medication Review Press photographer) Complete Complete  Some recent data might be hidden

## 2021-12-12 DIAGNOSIS — N179 Acute kidney failure, unspecified: Secondary | ICD-10-CM | POA: Diagnosis not present

## 2021-12-12 DIAGNOSIS — E876 Hypokalemia: Secondary | ICD-10-CM | POA: Diagnosis not present

## 2021-12-12 DIAGNOSIS — E038 Other specified hypothyroidism: Secondary | ICD-10-CM

## 2021-12-12 DIAGNOSIS — I10 Essential (primary) hypertension: Secondary | ICD-10-CM | POA: Diagnosis not present

## 2021-12-12 DIAGNOSIS — G9341 Metabolic encephalopathy: Secondary | ICD-10-CM | POA: Diagnosis not present

## 2021-12-12 NOTE — Discharge Summary (Signed)
Physician Discharge Summary   Patient: Joanna [name] MRN: [id number] DOB: 10-09-1951  Admit date:     12/03/2021  Discharge date: 12/12/21  Discharge Physician: Shanon Brow Maleko Greulich   PCP: [name], MD   Recommendations at discharge:  Follow recommendations of Palliative Care/ Hospice Attending  Discharge Diagnoses: 71 year old female who is a resident of a family care home, with history of hypertension, diabetes, chronic kidney disease stage IIIb, mild dementia, brought to the hospital with progressive encephalopathy.  Noted to be dehydrated with acute kidney injury, hypernatremia.  Imaging also indicated aspiration pneumonia.  She was started on IV unasyn.   She is on IV fluids for rehydration She was started on 1/2NS for hypernatremia.  This was changed to D5W when pt had hypoglycemia.  She was recently hospitalized from 10/31/21 to 11/02/21 due to ABLA and FOBT+.  During that hospitalization she underwent EGD which showed Two non-bleeding cratered gastric ulcers with a clean ulcer base (Forrest Class III) were found in the gastric antrum.   no presence of hematin or active bleeding was present.  She was transfused 2 units PRBC.  Hgb remained stable after transfusion.  The patient's mental status did improve with the above measures including IVF, PRBC, antibiotics.   Palliative medicine was consulted to assist and had [place] discussions with family.  Ultimately, it was decided the best disposition was to d/c back to ALF with palliative services. However, from 2/26 to 2/27, patient remained stable but mental status not as alert.  She was not following commands.  -I spoke with HPOA (Ms. Redmond Pulling) on 2/27 and updated her--Ms. Redmond Pulling does not want any further workup--no more blood work, no more radiographic studies, no further heroic measure; therefore, she agrees that the best course is to send patient back to [facility] with continued plans for palliative care/hospice care with transition to comfort measures  care     Hospital Course: Acute metabolic encephalopathy -multifactorial including renal failure and hypernatremia due to dehydration and aspiration pneumonia -CT head and CTA head and neck did not show any acute findings -continued with IV fluids and monitor electrolytes/urine output -Since admission, appears to be making very slow improvements -2/23--A&O x 1; answers simple yes/no questions; not following commands 2/24--a&o X 2, answers simple yes/no questions, occasionally follows commands 2/25--a&o X 2, answers simple yes/no questions, occasionally follows commands From 2/26 to 2/27 mental status not as alert, no as talkative -I spoke with HPOA (Ms. Redmond Pulling) on 2/27 and updated her--Ms. Redmond Pulling does not want any further workup--no more blood work, no more radiographic studies, no further heroic measure; therefore, she agrees that the best course is to send patient back to [facility] with continued plans for palliative care/hospice care with transition to comfort measures care   Acute kidney injury on chronic kidney disease stage IIIb -Related to dehydration from decreased p.o. intake -Continue IV fluids  -Renal ultrasound without evidence of hydronephrosis -I suspect the initial creatinine value of 0.6 on admission was an erroneous lab result -Previous Baseline creatinine likely ranging between 1.2-1.5 -Admission creatinine 2.6 -serum creatinine 2.10 at time of d/c -likely has new renal baseline 2.0-2.1   Acute on chronic anemia -transfuse 2 units PRBC>>Hgb stable after transfusion -discussed with daughter>>does not want any further procedures -continue PPI bid Hgb 10.5 at the time of d/c   Hypernatremia -Due to dehydration and poor p.o. intake -Continue hypotonic fluids >>switch to D5W -increase rate on IVF to 100cc/hr Improved--Na 141 at time of d/c   Hypoalbuminemia/anasarca -We  will need to hydrate cautiously with IV fluids -She is prone to third space fluid which may lead  to pulmonary edema and shortness of breath   Aspiration pneumonia -Noted on CT imaging -Continue Unasyn D#4 -d/c home with cefdinir and clinda x 2 more days   Hypothyroidism TSH 42, which has been trending down over the past 3 months from 59 She is continued on levothyroxine, but will change to IV since unable to take p.o. at this time Changed back to po at time of d/c   Type 2 diabetes -Continued on sliding scale insulin during hospitalization This was discontinued to allow for liberalized control   Hypokalemia -repleted IV and po   Hypomagnesemia -repleted IV and po   Chronic neuromuscular deficits -Chronic left-sided spastic contractures -Patient has been chronically bedbound   Major neurocognitive Disorder -Family reports that she is normally awake, alert, able to participate in conversation appropriately -Her daughter also reports that over the past few weeks she has been having decreased p.o. intake, at times refusing to eat or eating very little -Her mood otherwise appeared to be normal/engaged until she became encephalopathic -She had a similar incident last year where she became extremely dehydrated, but then began to eat and her acute issues at that time resolved.  She was [facility] to [facility]. -overall mental status gradually improving--A&O x 2 at time of d/c but does not carry on conversation -po intake remains poor -Palliative medicine re-consulted   Goals of care -During her last admission in September, she was seen by palliative care at that time and family had agreed to DNR status -This was reconfirmed with patient's daughter, that DNR is still in place -I mentioned that it would be helpful if palliative care follow in her hospital course as another layer of support and to help address goals of care>>no PEG -after [place] discussion with family, they decided to d/c pt back to ALF with hospice/palliative services -I spoke with HPOA (Ms. Redmond Pulling) on 2/27 and  updated her--Ms. Redmond Pulling does not want any further workup--no more blood work, no more radiographic studies, no further heroic measure; therefore, she agrees that the best course is to send patient back to [facility] with continued plans for palliative care/hospice care with transition to comfort measures care                Consultants: palliative Procedures performed: PICC  Disposition: Assisted living Diet recommendation:  Dysphagia type 3 thin Liquid  DISCHARGE MEDICATION: Allergies as of 12/12/2021       Reactions   Zoloft [sertraline Hcl] Other (See Comments)   Made the patient feel withdrawn and had bad dreams- "Allergic," per [facility]   Ivp Dye [iodinated Contrast Media] Rash, Other (See Comments)   "Allergic," per [facility]        Medication List     STOP taking these medications    acetaminophen 500 MG tablet Commonly known as: TYLENOL   albuterol 108 (90 Base) MCG/ACT inhaler Commonly known as: VENTOLIN HFA   fexofenadine 180 MG tablet Commonly known as: ALLEGRA       TAKE these medications    ascorbic acid 500 MG tablet Commonly known as: VITAMIN C Take 1 tablet (500 mg total) by mouth daily.   cefdinir 300 MG capsule Commonly known as: OMNICEF Take 1 capsule (300 mg total) by mouth daily. X 3 days   clindamycin 300 MG capsule Commonly known as: CLEOCIN Take 1 capsule (300 mg total) by mouth every 8 (eight)  hours. X 3 days   DULoxetine 60 MG capsule Commonly known as: Cymbalta Take 1 capsule (60 mg total) by mouth daily.   Ensure Plus High Protein Liqd Take 237 mLs by mouth 3 (three) times daily.   ferrous sulfate 325 (65 FE) MG tablet Take 325 mg by mouth daily with breakfast.   folic acid 1 MG tablet Commonly known as: FOLVITE TAKE (1) TABLET BY MOUTH ONCE DAILY. What changed:  how much to take how to take this when to take this additional instructions   levothyroxine 150 MCG tablet Commonly known as: SYNTHROID Take 1 tablet (150 mcg  total) by mouth daily.   Magnesium 250 MG Tabs Take 1 tablet (250 mg total) by mouth 2 (two) times daily.   metoprolol tartrate 25 MG tablet Commonly known as: LOPRESSOR Take 0.5 tablets (12.5 mg total) by mouth 2 (two) times daily.   multivitamin with minerals Tabs tablet Take 1 tablet by mouth daily.   pantoprazole 40 MG tablet Commonly known as: Protonix Take 1 tablet (40 mg total) by mouth 2 (two) times daily.   rosuvastatin 20 MG tablet Commonly known as: CRESTOR Take 1 tablet by mouth daily (Stop Pravastatin) What changed:  how much to take how to take this when to take this additional instructions   thiamine 100 MG tablet Take 1 tablet (100 mg total) by mouth daily.   topiramate 50 MG tablet Commonly known as: Topamax Take 1 tablet (50 mg total) by mouth 2 (two) times daily.   traZODone 50 MG tablet Commonly known as: DESYREL TAKE 1/2 TO 1 TABLET BY MOUTH EVERY NIGHT FOR SLEEP What changed:  how much to take how to take this when to take this additional instructions   vitamin B-12 1000 MCG tablet Commonly known as: CYANOCOBALAMIN TAKE (1) TABLET BY MOUTH ONCE DAILY. What changed: See the new instructions.   zinc sulfate 220 (50 Zn) MG capsule Take 1 capsule (220 mg total) by mouth daily.         Discharge Exam: Filed Weights   12/03/21 1326  Weight: 48 kg   HEENT:  [place]/AT, No thrush, no icterus CV:  RRR, no rub, no S3, no S4 Lung: bibasilar rales.  No wheeze Abd:  soft/+BS, NT Ext:  No edema, no lymphangitis, no synovitis, no rash   Condition at discharge: stable  The results of significant diagnostics from this hospitalization (including imaging, microbiology, ancillary and laboratory) are listed below for reference.   Imaging Studies: CT ANGIO HEAD NECK W WO CM  Result Date: 12/04/2021 CLINICAL DATA:  71 year old female with neurologic deficit, altered mental status. EXAM: CT ANGIOGRAPHY HEAD AND NECK TECHNIQUE: Multidetector CT imaging  of the head and neck was performed using the standard protocol during bolus administration of intravenous contrast. Multiplanar CT image reconstructions and MIPs were obtained to evaluate the vascular anatomy. Carotid stenosis measurements (when applicable) are obtained utilizing NASCET criteria, using the distal internal carotid diameter as the denominator. RADIATION DOSE REDUCTION: This exam was performed according to the departmental dose-optimization program which includes automated exposure control, adjustment of the mA and/or kV according to patient size and/or use of iterative reconstruction technique. CONTRAST:  35mL OMNIPAQUE IOHEXOL 350 MG/ML SOLN COMPARISON:  Head CT 12/03/2021. Brain MRI 07/08/2021. Carotid ultrasound 04/20/2021. Chest CT 05/20/2021. FINDINGS: CT HEAD Brain: Scaphocephaly. Stable non contrast CT appearance of the brain. Patchy and confluent bilateral cerebral white matter hypodensity. Small area of cortical encephalomalacia in the right superior frontal gyrus. Calvarium and skull  base: No acute osseous abnormality identified. Paranasal sinuses: Visualized paranasal sinuses and mastoids are clear. Orbits: No acute orbit or scalp soft tissue finding. CTA NECK Skeleton: Previous C4-C5 and C5-C6 ACDF with solid arthrodesis. Superimposed cervical spine degeneration. No acute osseous abnormality identified. Upper chest: Opacified bronchus intermedius with patchy and nodular peribronchial opacity in the posterior right upper lobe and throughout the visible superior segment of the right lower lobe. Some upper lobe airway thickening and/or opacification. Negative left lung. Visible central pulmonary arteries appear intact. No superior mediastinal lymphadenopathy. Other neck: No acute neck soft tissue finding. Aortic arch: Tortuous aortic arch. Calcified aortic atherosclerosis. 3 vessel arch configuration. Right carotid system: Tortuous brachiocephalic artery and proximal right CCA without plaque  or stenosis. Negative right carotid bifurcation. Tortuous right ICA distal to the bulb but no stenosis. Left carotid system: Negative aside from tortuosity. Vertebral arteries: Tortuous proximal right subclavian artery with mild calcified plaque and no stenosis. Normal right vertebral artery origin. Tortuous right vertebral artery appears somewhat dominant and is patent to the skull base without stenosis. Left vertebral artery negative aside from tortuosity, mildly non dominant. CTA HEAD Posterior circulation: Dominant right vertebral V4 segment is tortuous with calcified atherosclerosis but no stenosis. Negative left V4 segment. Normal PICA origins. Tortuous vertebrobasilar junction and basilar artery with minimal calcified plaque and no stenosis. Patent SCA and PCA origins. Posterior communicating arteries are diminutive or absent. Bilateral PCA branches are normal aside from tortuosity. Anterior circulation: Both ICA siphons are patent. Mild siphon tortuosity. Mild calcified atherosclerosis and irregularity of the cavernous and supraclinoid segments but no significant stenosis. Patent carotid termini. Normal MCA and ACA origins. Mildly tortuous A1 segments. Normal anterior communicating artery. Bilateral ACA branches are within normal limits. Left MCA M1 segment bifurcates early without stenosis. Right MCA M1 and bifurcation are patent without stenosis. Bilateral MCA branches appear normal aside from tortuosity. Venous sinuses: Early contrast timing but grossly patent. Anatomic variants: Dominant right vertebral artery. Review of the MIP images confirms the above findings IMPRESSION: 1. Negative for large vessel occlusion. Pronounced generalized arterial tortuosity in the head and neck. But mild for age atherosclerosis and no significant stenosis. 2. Abnormal Right Lung, with opacified bronchus intermedius, abnormal right upper and lower lobe peribronchial opacity. Constellation favors favor Multilobar Pneumonia  Secondary To Aspiration. Follow-up Chest Radiographs may be valuable. 3. Stable CT appearance of the brain. Previous C4-C5 and C5-C6 ACDF with solid arthrodesis. 4. Aortic Atherosclerosis (ICD10-I70.0). Electronically Signed   By: Genevie Ann M.D.   On: 12/04/2021 08:57   DG Chest 1 View  Result Date: 12/03/2021 CLINICAL DATA:  Altered mental status EXAM: CHEST  1 VIEW COMPARISON:  07/09/2021 FINDINGS: The heart size and mediastinal contours are within normal limits. Both lungs are clear. The visualized skeletal structures are unremarkable. IMPRESSION: No acute abnormality of the lungs in AP portable projection. Electronically Signed   By: Delanna Ahmadi M.D.   On: 12/03/2021 14:05   CT HEAD WO CONTRAST  Result Date: 12/03/2021 CLINICAL DATA:  Mental status changes. EXAM: CT HEAD WITHOUT CONTRAST TECHNIQUE: Contiguous axial images were obtained from the base of the skull through the vertex without intravenous contrast. RADIATION DOSE REDUCTION: This exam was performed according to the departmental dose-optimization program which includes automated exposure control, adjustment of the mA and/or kV according to patient size and/or use of iterative reconstruction technique. COMPARISON:  07/07/2021 FINDINGS: Brain: There is no evidence for acute hemorrhage, hydrocephalus, mass lesion, or abnormal extra-axial fluid collection. No definite  CT evidence for acute infarction. Patchy low attenuation in the deep hemispheric and periventricular white matter is nonspecific, but likely reflects chronic microvascular ischemic demyelination. Vascular: No hyperdense vessel or unexpected calcification. Skull: No evidence for fracture. No worrisome lytic or sclerotic lesion. Sinuses/Orbits: The visualized paranasal sinuses and mastoid air cells are clear. Visualized portions of the globes and intraorbital fat are unremarkable. Other: None. IMPRESSION: 1. No acute intracranial abnormality. 2. Chronic small vessel ischemic disease.  Electronically Signed   By: Misty Stanley M.D.   On: 12/03/2021 14:03   US RENAL  Result Date: 12/05/2021 CLINICAL DATA:  AKI EXAM: RENAL / URINARY TRACT ULTRASOUND COMPLETE COMPARISON:  Renal ultrasound 07/07/2021, CT abdomen/pelvis 11/07/2019 FINDINGS: Right Kidney: Renal measurements: 9.3 cm x 4.3 cm x 5.6 cm = volume: 117 mL. Parenchymal echogenicity is increased. There is no hydronephrosis. Left Kidney: Renal measurements: 8.4 cm x 4.5 cm x 4.6 cm = volume: 91 mL. Parenchymal echogenicity is increased. There is no hydronephrosis. Bladder: Appears normal for degree of bladder distention. Other: None. IMPRESSION: Increased parenchymal echogenicity bilaterally consistent with medical renal disease. No hydronephrosis. Electronically Signed   By: Valetta Mole M.D.   On: 12/05/2021 11:32   DG Chest Port 1 View  Result Date: 12/09/2021 CLINICAL DATA:  Progressive encephalopathy, bleeding from the PICC line. EXAM: PORTABLE CHEST 1 VIEW COMPARISON:  Portable chest 12/03/2021. FINDINGS: The heart is slightly enlarged. No vascular congestion is seen. There is aortic atherosclerosis with mild tortuosity and stable mediastinum. The lungs are generally clear as far seen. The patient's right hand superimposes over the lower right chest and partially obscures the right lower lung field. A right PICC has been inserted since prior study. The tip is in the upper right atrium. There is no pneumothorax. Multiple overlying monitor wires. Slight thoracic dextroscoliosis. IMPRESSION: Visualized lungs clear with partial obscuration of the right lower lung field by the patient's right hand. Interval right PICC insertion with tip in the upper right atrium. Aortic atherosclerosis. Electronically Signed   By: Telford Nab M.D.   On: 12/09/2021 03:26   [place] EKG SITE RITE  Result Date: 12/05/2021 If [facility] image not attached, placement could not be confirmed due to current cardiac rhythm.   Microbiology: Results for orders  placed or performed during the hospital encounter of 12/03/21  Urine Culture     Status: None   Collection Time: 12/03/21  1:37 PM   Specimen: In/Out Cath Urine  Result Value Ref Range Status   Specimen Description   Final    IN/OUT CATH URINE Performed at [facility], [address], [place], [place] [address]    Special Requests   Final    Normal Performed at [facility], [address] [place], [place] [address]    Culture   Final    NO GROWTH Performed at [facility], [place] [address] [place], [place] [address]    Report Status 12/05/2021 FINAL  Final  Resp Panel by RT-PCR (Flu A&B, Covid) Nasopharyngeal Swab     Status: None   Collection Time: 12/03/21  5:39 PM   Specimen: Nasopharyngeal Swab; Nasopharyngeal(NP) swabs in vial transport medium  Result Value Ref Range Status   SARS Coronavirus 2 by RT PCR NEGATIVE NEGATIVE Final    Comment: (NOTE) SARS-CoV-2 target nucleic acids are NOT DETECTED.  The SARS-CoV-2 RNA is generally detectable in upper respiratory specimens during the acute phase of infection. The lowest concentration of SARS-CoV-2 viral copies this assay can detect  is 138 copies/mL. A negative result does not preclude SARS-Cov-2 infection and should not be used as the sole basis for treatment or other patient management decisions. A negative result may occur with  improper specimen collection/handling, submission of specimen other than nasopharyngeal swab, presence of viral mutation(s) within the areas targeted by this assay, and inadequate number of viral copies(<138 copies/mL). A negative result must be combined with clinical observations, patient history, and epidemiological information. The expected result is Negative.  Fact Sheet for Patients:  EntrepreneurPulse.com.au  Fact Sheet for Healthcare Providers:  IncredibleEmployment.be  This test is no t yet approved or cleared by the [place] FDA and  has  been authorized for detection and/or diagnosis of SARS-CoV-2 by FDA under an Emergency Use Authorization (EUA). This EUA will remain  in effect (meaning this test can be used) for the duration of the COVID-19 declaration under Section 564(b)(1) of the Act, 21 U.S.C.section 360bbb-3(b)(1), unless the authorization is terminated  or revoked sooner.       Influenza A by PCR NEGATIVE NEGATIVE Final   Influenza B by PCR NEGATIVE NEGATIVE Final    Comment: (NOTE) The Xpert Xpress SARS-CoV-2/FLU/RSV plus assay is intended as an aid in the diagnosis of influenza from Nasopharyngeal swab specimens and should not be used as a sole basis for treatment. Nasal washings and aspirates are unacceptable for Xpert Xpress SARS-CoV-2/FLU/RSV testing.  Fact Sheet for Patients: EntrepreneurPulse.com.au  Fact Sheet for Healthcare Providers: IncredibleEmployment.be  This test is not yet approved or cleared by the [place] FDA and has been authorized for detection and/or diagnosis of SARS-CoV-2 by FDA under an Emergency Use Authorization (EUA). This EUA will remain in effect (meaning this test can be used) for the duration of the COVID-19 declaration under Section 564(b)(1) of the Act, 21 U.S.C. section 360bbb-3(b)(1), unless the authorization is terminated or revoked.  Performed at [facility], [address] [place], [place] [address]   Culture, blood (Routine X 2) w Reflex to ID Panel     Status: Abnormal   Collection Time: 12/03/21  6:53 PM   Specimen: BLOOD RIGHT ARM  Result Value Ref Range Status   Specimen Description   Final    BLOOD RIGHT ARM BOTTLES DRAWN AEROBIC ONLY Performed at [facility], [address] [place], [place] [address]    Special Requests   Final    Blood Culture results may not be optimal due to an inadequate volume of blood received in culture bottles Performed at [facility], [address] [place], [place] [address]     Culture  Setup Time   Final    Gram Stain Report Called to,Read Back By and Verified With: VALDESE DILDY @ [id number] ON 12/06/21 C VARNER CORRECTED RESULTS GRAM POSITIVE RODS PREVIOUSLY REPORTED AS: GRAM POSITIVE COCCI CORRECTED RESULTS CALLED TO: PHARMD G.COFFEE AT 3825 ON 12/07/2021 BY T.SAAD.    Culture (A)  Final    DIPHTHEROIDS(CORYNEBACTERIUM SPECIES) Standardized susceptibility testing for this organism is not available. Performed at [facility], [place] [address] [place], [place] [address]    Report Status 12/07/2021 FINAL  Final  Culture, blood (Routine X 2) w Reflex to ID Panel     Status: None   Collection Time: 12/03/21  6:54 PM   Specimen: BLOOD RIGHT WRIST  Result Value Ref Range Status   Specimen Description BLOOD RIGHT WRIST BOTTLES DRAWN AEROBIC ONLY  Final   Special Requests   Final    Blood Culture results may not  be optimal due to an inadequate volume of blood received in culture bottles   Culture   Final    NO GROWTH 5 DAYS Performed at [facility], [address], [place], [place] [address]    Report Status 12/08/2021 FINAL  Final    Labs: CBC: Recent Labs  Lab 12/06/21 0346 12/07/21 0623 12/08/21 0452 12/08/21 0725 12/09/21 0438  WBC 11.5* 15.1* 13.0*  --  11.7*  HGB 7.9* 8.3* 6.7* 6.6* 7.9*  HCT 27.7* 28.3* 22.0* 21.8* 25.8*  MCV 90.5 92.5 89.1  --  87.5  PLT 163 148* 117*  --  793*   Basic Metabolic Panel: Recent Labs  Lab 12/06/21 0346 12/06/21 0848 12/07/21 0623 12/08/21 0452 12/09/21 0150  NA 140 147* 148* 144 140  K 3.8 3.7 3.9 3.3* 4.2  CL 116* 120* 119* 117* 114*  CO2 18* 20* 19* 19* 16*  GLUCOSE 500* 151* 88 130* 124*  BUN 67* 67* 63* 61* 52*  CREATININE 2.04* 2.16* 2.21* 2.34* 2.10*  CALCIUM 9.4 10.0 9.2 8.5* 8.6*  MG 2.0  --  1.9 1.7 1.6*  PHOS 1.5* 1.6* 5.0*  --   --    Liver Function Tests: Recent Labs  Lab 12/06/21 0346 12/07/21 0623 12/08/21 0452  AST 11* 111* 38  ALT 15 109* 72*  ALKPHOS 68 104 97  BILITOT 0.3  0.4 0.6  PROT 5.3* 5.9* 5.0*  ALBUMIN 2.0* 2.1* 1.8*   CBG: Recent Labs  Lab 12/07/21 0754 12/07/21 1210 12/07/21 1744 12/08/21 0032 12/08/21 0952  GLUCAP 86 134* 74 247* 139*    Discharge time spent: greater than 30 minutes.  Signed: Orson Eva, MD [facility] 12/12/2021

## 2021-12-12 NOTE — TOC Progression Note (Signed)
Transition of Care [facility]) - Progression Note    Patient Details  Name: Joanna [name] MRN: [id number] Date of Birth: 1951-01-27  Transition of Care [facility]) CM/SW Contact  Salome Arnt, [place] Phone Number: 12/12/2021, 8:03 AM  Clinical Narrative:  LCSW confirmed pt remains on EMS transport list. Per Wells Guiles, there is a pre-scheduled transport at 9:45 and then pt would be next on list. Carin Primrose updated.       Expected Discharge Plan:  [facility]) Barriers to Discharge: Barriers Resolved  Expected Discharge Plan and Services Expected Discharge Plan:  [facility])         Expected Discharge Date: 12/09/21                                     Social Determinants of Health (SDOH) Interventions    Readmission Risk Interventions Readmission Risk Prevention Plan 11/01/2021 06/27/2021  Transportation Screening Complete Complete  HRI or Home Care Consult Complete Complete  Social Work Consult for [place] Planning/Counseling Complete Complete  Palliative Care Screening Not Applicable Not Applicable  Medication Review [employer]) Complete Complete  Some recent data might be hidden

## 2021-12-13 ENCOUNTER — Other Ambulatory Visit: Payer: Self-pay | Admitting: Family Medicine

## 2021-12-13 NOTE — Progress Notes (Unsigned)
Patient's condition is imminent death.  Cutting back on some of the supplements.  Cutting back on vitamins.  Also stopped rosuvastatin.

## 2021-12-14 ENCOUNTER — Encounter: Payer: PPO | Admitting: Family Medicine

## 2021-12-15 ENCOUNTER — Other Ambulatory Visit: Payer: Self-pay | Admitting: Family Medicine

## 2021-12-15 ENCOUNTER — Ambulatory Visit: Payer: PPO | Admitting: Family Medicine

## 2021-12-16 ENCOUNTER — Other Ambulatory Visit: Payer: Self-pay

## 2021-12-16 ENCOUNTER — Ambulatory Visit (INDEPENDENT_AMBULATORY_CARE_PROVIDER_SITE_OTHER): Payer: PPO | Admitting: Family Medicine

## 2021-12-16 DIAGNOSIS — D696 Thrombocytopenia, unspecified: Secondary | ICD-10-CM | POA: Diagnosis not present

## 2021-12-16 DIAGNOSIS — G959 Disease of spinal cord, unspecified: Secondary | ICD-10-CM | POA: Insufficient documentation

## 2021-12-16 NOTE — Progress Notes (Signed)
? ?  Subjective:  ? ? Patient ID: Joanna [name], female    DOB: 07/12/1951, 71 y.o.   MRN: [id number] ? ?HPI ? ?Patient currently on hospice ?Home Visit  ?Patient has neurodegenerative disease ?She is bed ridden ?Noncommunicative ?Eats very little drinks very little ?Long term expectancy of life is very limited ?Patient is DNR hospice cared for currently ?Does have pressure sores on sacrum and heel ?Being dressed by hospice nurse ?Review of Systems ? ?   ?Objective:  ? Physical Exam ? ?Lungs are clear bilateral heart regular skin warm dry muscle mass is low neurologically can open her eyes and look at me cannot talk unable to nod her head unable to grip her hand ? ? ?   ?Assessment & Plan:  ?Neurodegenerative disease ?Poor outlook ?Very limited life span ?Will try to cut back dramatically on her medications in order to lessen the burden on her ? ?A telephone message will be made regarding her med list so that it can be shared with Rx care as well as the nurses ?

## 2021-12-18 ENCOUNTER — Telehealth: Payer: Self-pay | Admitting: Family Medicine

## 2021-12-18 NOTE — Telephone Encounter (Signed)
Nurses ?I did review over her medications ? ?Please update her medicine list to reflect the following ? ?Patient is currently on ?Vitamin B1 100 mg 1 daily, vitamin B12 1000 mcg 1 daily, vitamin C 500 mg 1 daily, metoprolol 25 mg twice daily, duloxetine 60 mg 1 daily, Ensure liquid 3 times daily, folic acid 1 mg daily, levothyroxine 150 mcg 1 daily, pantoprazole 40 mg twice daily ? ?It should also be noted that I discontinued the following at the rest home ?Zinc sulfate, rosuvastatin, Theragran-M M, topiramate 50 mg, trazodone 50 mg, FeroSul 325 mg, magnesium 250 mg ? ?Please update her med list on epic ?Please also send a copy of the updated med list to Rx care thank you ?

## 2021-12-19 NOTE — Telephone Encounter (Signed)
Med list updated (metoprolol 25 mg 0.5 tab BID per Dr Hilaria Ota faxed to [facility]. ?

## 2021-12-22 ENCOUNTER — Telehealth: Payer: Self-pay | Admitting: Family Medicine

## 2021-12-22 NOTE — Telephone Encounter (Signed)
I recommend that they consult hospice directly and hospice will also get verbal orders from's but they have a protocol that they can follow to help with this ?

## 2021-12-22 NOTE — Telephone Encounter (Signed)
Drs recommendations given to Joanna [name] from [employer] ?I recommend that they consult hospice directly and hospice will also get verbal orders from's but they have a protocol that they can follow to help with this ?

## 2021-12-22 NOTE — Addendum Note (Signed)
Encounter addended by: Annie Paras on: 12/22/2021 9:49 AM  Actions taken: Letter saved

## 2021-12-22 NOTE — Telephone Encounter (Signed)
Elberta Fortis calling from [facility] and wanting to know if he needs to call Hospice or could PCP call in something. Pt wound on buttock area is bigger and is now hurting pt. Pt came back to facility with wound. Home care is doing wet to dry dressing. Please advise. Thank you ? ?[facility] ?Carin Primrose [phone] ?

## 2021-12-25 ENCOUNTER — Other Ambulatory Visit (HOSPITAL_COMMUNITY)
Admission: RE | Admit: 2021-12-25 | Discharge: 2021-12-25 | Disposition: A | Discharge status: Home / Self Care | Payer: PPO | Source: Other Acute Inpatient Hospital | Attending: Hematology and Oncology | Admitting: Hematology and Oncology

## 2021-12-25 DIAGNOSIS — R8290 Unspecified abnormal findings in urine: Secondary | ICD-10-CM | POA: Insufficient documentation

## 2021-12-25 LAB — URINALYSIS, ROUTINE W REFLEX MICROSCOPIC
Bilirubin Urine: NEGATIVE
Glucose, UA: NEGATIVE mg/dL
Ketones, ur: NEGATIVE mg/dL
Nitrite: NEGATIVE
Protein, ur: NEGATIVE mg/dL
Specific Gravity, Urine: 1.006 (ref 1.005–1.030)
pH: 5 (ref 5.0–8.0)

## 2021-12-27 ENCOUNTER — Other Ambulatory Visit: Payer: Self-pay | Admitting: *Deleted

## 2021-12-27 LAB — URINE CULTURE: Culture: >=100000 — AB

## 2022-01-12 ENCOUNTER — Other Ambulatory Visit: Payer: Self-pay | Admitting: Family Medicine

## 2022-02-10 ENCOUNTER — Other Ambulatory Visit: Payer: Self-pay | Admitting: Family Medicine

## 2022-02-13 ENCOUNTER — Other Ambulatory Visit: Payer: Self-pay | Admitting: Family Medicine

## 2022-02-20 ENCOUNTER — Ambulatory Visit: Payer: PPO | Admitting: Neurology

## 2022-03-15 ENCOUNTER — Other Ambulatory Visit: Payer: Self-pay | Admitting: Family Medicine

## 2022-04-05 ENCOUNTER — Telehealth: Payer: Self-pay | Admitting: Family Medicine

## 2022-04-05 NOTE — Telephone Encounter (Signed)
Called to schedule AWV, but found out she is in Hospice care  - Caregiver asked about getting an appointment scheduled for patient.   She was told by Hospice that Dr Wolfgang Phoenix would come see her.

## 2022-04-05 NOTE — Telephone Encounter (Signed)
Nurses Please connect with any Prout's caregiver. She is on hospice I would do a home visit for her Please verify location She may be scheduled as a 3:50 PM visit-it is my understanding she was in a records family home care on the other side of [facility] On that day we would cancel the 4:10 PM appointment  #1 please verify #2 please schedule #3 please record caretaker name, phone number, address so I can review efficiently find a me on the day of the home visit

## 2022-04-06 NOTE — Telephone Encounter (Signed)
Spoke with Levie Heritage (sister-in-law), address is : [address] [place], [place].  [name] says she is not sure who the caregiver will be on that day, but says you can call her [phone]. Appt made for 04/12/22 at 3:50 p.

## 2022-04-12 ENCOUNTER — Ambulatory Visit: Payer: PPO | Admitting: Family Medicine

## 2022-04-12 DIAGNOSIS — G959 Disease of spinal cord, unspecified: Secondary | ICD-10-CM

## 2022-04-12 DIAGNOSIS — Z66 Do not resuscitate: Secondary | ICD-10-CM | POA: Diagnosis not present

## 2022-04-12 DIAGNOSIS — Z515 Encounter for palliative care: Secondary | ICD-10-CM

## 2022-04-12 MED ORDER — DULOXETINE HCL 30 MG PO CPEP: 30.0000 mg | ORAL_CAPSULE | Freq: Every day | ORAL | 5 refills | Status: AC

## 2022-04-12 NOTE — Progress Notes (Signed)
   Subjective:    Patient ID: Joanna [name], female    DOB: January 04, 1951, 71 y.o.   MRN: [id number]  HPI Hospice patient Nursing home patient She is bedbound She has neurodegenerative disease Neuromuscular disease Started off in her left arm but then progressively got worse She now has no use of arms or legs Contractures noted in the arms When she was in the hospital she came home to the rest home with a decubiti in the sacrum as well as the heels These have now healed She does seem to eat okay according to caretakers there She is on medications    Review of Systems     Objective:   Physical Exam Lungs clear heart regular pulse normal it is noted that she is anemic appearing and her conjunctive are pale  Patient is a hospice patient No code      Assessment & Plan:  She is a hospice patient Long-term prognosis is poor 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 Due to her cachexia and weight loss recommend reducing duloxetine I did discuss the case with Carin Primrose as well as Levie Heritage

## 2022-04-13 ENCOUNTER — Other Ambulatory Visit: Payer: Self-pay | Admitting: Family Medicine

## 2022-04-24 ENCOUNTER — Other Ambulatory Visit: Payer: Self-pay

## 2022-04-24 ENCOUNTER — Telehealth: Payer: Self-pay | Admitting: *Deleted

## 2022-04-24 MED ORDER — MELATONIN 5 MG SL SUBL: 5.0000 mg | SUBLINGUAL_TABLET | Freq: Every evening | SUBLINGUAL | 1 refills | Status: AC

## 2022-04-24 NOTE — Telephone Encounter (Signed)
Jarrett Soho informed of Dr. Lance Sell order. Verbalized understanding.

## 2022-04-24 NOTE — Telephone Encounter (Signed)
Jarrett Soho from [facility], calling to report pt isn't sleeping well and wants to know if something can be ordered, like Melatonin.  Please advise. Thank you    [phone]

## 2022-04-24 NOTE — Telephone Encounter (Signed)
May go ahead with melatonin 5 mg dissolvable tablet each evening

## 2022-05-03 ENCOUNTER — Telehealth: Payer: Self-pay | Admitting: *Deleted

## 2022-05-03 NOTE — Telephone Encounter (Signed)
Jennifer from Hospice called to say that pt is having green pus coming from vagina. She says pt does have a foley and is not kept very clean. She has noticed stool around the foley. She wants to know if you want a urinalysis to be done and send in antibiotic in for her. Please advise. Thank you  [phone]

## 2022-05-03 NOTE — Telephone Encounter (Signed)
Jennifer from [facility] informed of md orders. Verbalized understanding.

## 2022-05-04 ENCOUNTER — Other Ambulatory Visit (HOSPITAL_COMMUNITY)
Admission: RE | Admit: 2022-05-04 | Discharge: 2022-05-04 | Disposition: A | Discharge status: Home / Self Care | Payer: PPO | Source: Other Acute Inpatient Hospital | Attending: Family Medicine | Admitting: Family Medicine

## 2022-05-04 DIAGNOSIS — N390 Urinary tract infection, site not specified: Secondary | ICD-10-CM | POA: Insufficient documentation

## 2022-05-04 LAB — URINALYSIS, ROUTINE W REFLEX MICROSCOPIC
Bilirubin Urine: NEGATIVE
Glucose, UA: NEGATIVE mg/dL
Ketones, ur: NEGATIVE mg/dL
Nitrite: NEGATIVE
Protein, ur: 100 mg/dL — AB
Specific Gravity, Urine: 1.015 (ref 1.005–1.030)
WBC, UA: >50 WBC/hpf — ABNORMAL HIGH (ref 0–5)
pH: 7 (ref 5.0–8.0)

## 2022-05-05 ENCOUNTER — Other Ambulatory Visit: Payer: Self-pay

## 2022-05-05 MED ORDER — CEFPROZIL 250 MG/5ML PO SUSR: 250.0000 mg | Freq: Two times a day (BID) | ORAL | 0 refills | Status: AC

## 2022-05-16 ENCOUNTER — Other Ambulatory Visit: Payer: Self-pay | Admitting: Family Medicine

## 2022-05-22 ENCOUNTER — Telehealth: Payer: Self-pay | Admitting: Neurology

## 2022-05-22 NOTE — Telephone Encounter (Signed)
Lm to get pt appt rescheduled due to provider being out.

## 2022-05-23 NOTE — Telephone Encounter (Signed)
I called pt's sister she states Joanna [name] is a living facility now, and there is no need for the appt anymore due to pt seeing hospice.

## 2022-05-24 ENCOUNTER — Ambulatory Visit: Payer: PPO | Admitting: Neurology

## 2022-05-25 ENCOUNTER — Telehealth: Payer: Self-pay | Admitting: Neurology

## 2022-05-25 NOTE — Telephone Encounter (Signed)
Pt daughter is calling to reschedule Pt Botox appointment.

## 2022-05-25 NOTE — Telephone Encounter (Signed)
Called Pt daughter Fonda Kinder) back to schedule appointment and she said Pt is now in a hospice house and she just wanted to let Provider know.

## 2022-05-25 NOTE — Telephone Encounter (Signed)
Called Pt daughter

## 2022-06-20 ENCOUNTER — Other Ambulatory Visit: Payer: Self-pay | Admitting: Family Medicine

## 2022-06-22 ENCOUNTER — Other Ambulatory Visit: Payer: Self-pay | Admitting: Family Medicine

## 2022-06-26 ENCOUNTER — Telehealth: Payer: Self-pay | Admitting: *Deleted

## 2022-06-26 NOTE — Telephone Encounter (Signed)
Hospice called and stated the patient began with vaginal bleeding over weekend and staff reports foul vagina discharge. The on call dr for hospice sent in Cipro for 5 days. Nurse checked patient today and still has some vaginal bleeding but no discharge and not sure why there is vaginal bleeding - the foley is in place and intact  Patient also has small scratch on bottom in the same area where she had a pervious sore  They also need order to D/C Melatonin because it is making the patient sleepy and groggy all day  Arbie Cookey at [facility] requesting a call [phone]

## 2022-06-28 ENCOUNTER — Ambulatory Visit: Admitting: Family Medicine

## 2022-06-28 DIAGNOSIS — R54 Age-related physical debility: Secondary | ICD-10-CM

## 2022-06-28 DIAGNOSIS — R64 Cachexia: Secondary | ICD-10-CM

## 2022-06-28 DIAGNOSIS — D500 Iron deficiency anemia secondary to blood loss (chronic): Secondary | ICD-10-CM | POA: Diagnosis not present

## 2022-06-28 DIAGNOSIS — Z515 Encounter for palliative care: Secondary | ICD-10-CM

## 2022-06-28 DIAGNOSIS — Z66 Do not resuscitate: Secondary | ICD-10-CM

## 2022-06-28 DIAGNOSIS — F0390 Unspecified dementia without behavioral disturbance: Secondary | ICD-10-CM

## 2022-06-28 NOTE — Telephone Encounter (Signed)
Dr Nicki Reaper going to see patient this afternoon for a home visit

## 2022-06-28 NOTE — Progress Notes (Signed)
   Subjective:    Patient ID: Joanna [name], female    DOB: Jul 22, 1951, 71 y.o.   MRN: [id number]  HPI Provider seeing patient for home visit at nursing facility. Pt nephew does have some questions for provider but will ask when provider gets there.  The vaginal area had been treated with Cipro then with Diflucan.  Seemingly doing better now.  Review of Systems     Objective:   Physical Exam Patient cachectic  Lungs are clear hearts regular pulse normal no bedsores noted on the legs or heels Vaginal area has a mucoid discharge much better than what it was according to the aids      Assessment & Plan:  Rest home visit Hospice patient 1. Frailty Dietary trying to be supported as best as possible  2. Cachexia ([place]) Losing muscle mass this is partly due to the degenerative issues this is also partly due to nutritional deficits Staff is doing the best they can and feeding her  3. Dementia without behavioral disturbance, psychotic disturbance, mood disturbance, or anxiety, unspecified dementia severity, unspecified dementia type ([place]) Patient with moderate dementia able to respond with one-word's him with looking  4. 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 Hospice care patient See per above  6. DNR (do not resuscitate) See per above Hospice patient

## 2022-06-29 ENCOUNTER — Telehealth: Payer: Self-pay

## 2022-06-29 NOTE — Telephone Encounter (Signed)
Caller name:Zita Cudworth   On DPR? :Yes  Call back number:[phone] (Hospice) Provider they see: Dr Nicki Reaper   Reason for call:received verbal order faxed over on Joanna [name] from [facility] in Dr Nicki Reaper yellow folder up front

## 2022-06-29 NOTE — Telephone Encounter (Signed)
Patient was seen. 

## 2022-06-29 NOTE — Telephone Encounter (Signed)
Please advise. Thank you

## 2022-07-03 NOTE — Telephone Encounter (Signed)
All forms have been signed off

## 2022-07-19 NOTE — Progress Notes (Signed)
LDCT follow-up not scheduled as the patient is enrolled in hospice care at this time. Referral closed.

## 2022-07-20 ENCOUNTER — Telehealth: Payer: Self-pay | Admitting: Family Medicine

## 2022-07-20 NOTE — Telephone Encounter (Signed)
[facility] requesting refills on OTC Vit B1, Vit C and Vit B12. Vit C. Vit B12 and Vit C on file but Vit B1 not on current med list. Pt takes these on a daily basis. Please advise. Thank you

## 2022-07-20 NOTE — Telephone Encounter (Signed)
Not quite sure why she is on the vitamin B1 but it will not hurt her.  It would be fine to give approval to Rx [facility] for all 3 of these may have 1 year refill on those thank you

## 2022-07-20 NOTE — Telephone Encounter (Signed)
Joanna [name] at [facility] contacted and verbalized understanding.

## 2022-07-24 ENCOUNTER — Other Ambulatory Visit: Payer: Self-pay | Admitting: Family Medicine

## 2022-07-24 ENCOUNTER — Telehealth: Payer: Self-pay | Admitting: Adult Health

## 2022-07-24 MED ORDER — LORAZEPAM 0.5 MG PO TABS: ORAL_TABLET | ORAL | 0 refills | Status: AC

## 2022-07-24 MED ORDER — ATROPINE SULFATE 1 % OP SOLN: OPHTHALMIC | 3 refills | Status: AC

## 2022-07-24 NOTE — Progress Notes (Signed)
I had detailed discussion with hospice nurse patient imminent death not eating or drinking mottled skin per recommendation of hospice nurse lorazepam 0.5 mg every 3 as needed, atropine eyedrops for oral secretions excessive 4 drops every 6 hours as needed Nurse was given verbal order that they may stop all other medications

## 2022-07-25 ENCOUNTER — Telehealth: Payer: Self-pay | Admitting: *Deleted

## 2022-08-07 ENCOUNTER — Telehealth: Payer: Self-pay | Admitting: Family Medicine

## 2022-08-07 NOTE — Telephone Encounter (Signed)
[place] calling about update on Death Certificate for patient. Her number is [phone]

## 2022-08-08 NOTE — Telephone Encounter (Signed)
[facility] calling and states that there were 2 cases started but that should be fixed now. Funeral home did call [place]. Case ID # to work with is [id number]. Please advise. Thank you.

## 2022-08-09 NOTE — Telephone Encounter (Signed)
This case was filled out, death certificate has been filled out and completed

## 2022-08-16 NOTE — Telephone Encounter (Signed)
Joanna [name] from [facility] called requesting a call for clarification of Hospice order   [phone]

## 2022-08-16 NOTE — Telephone Encounter (Signed)
Basically Philip needed to stop all nonessential medicines.  This order was given.

## 2022-08-16 DEATH — deceased

## 2023-01-21 IMAGING — CT CT HEAD W/O CM
4 series · 16 of 30 positions shown, 17 images · non-contrast
Comparison: Brain MRI 11/12/2019, CT head 06/25/2021

CLINICAL DATA: Altered mental status

EXAM:
CT HEAD WITHOUT CONTRAST
TECHNIQUE: Contiguous axial images were obtained from the base of the skull
through the vertex without intravenous contrast.

[Series 3: head without · axial · non-contrast · 0.43mm/px · z∈[+1221,+1271]mm · 2 of 32 slices shown, 3 images]
[im 11/32  brain]
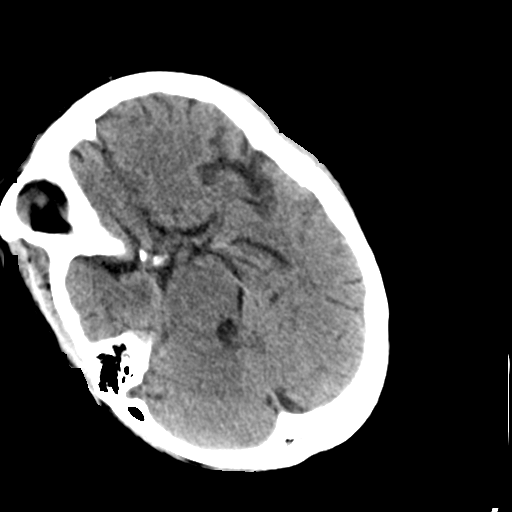
[im 11/32  bone]
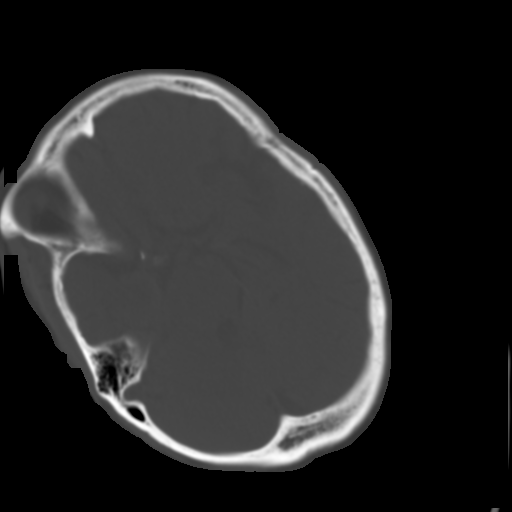
[im 21/32  brain]
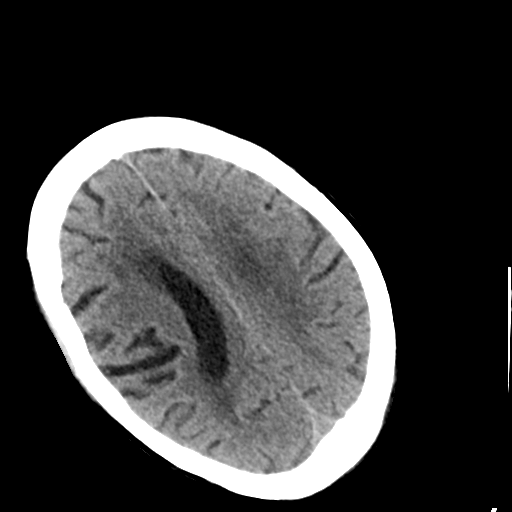

[Series 4: head bone · axial · 0.43mm/px · z∈[+1185,+1309]mm · 8 of 78 slices shown]
[im 8/78  bone]
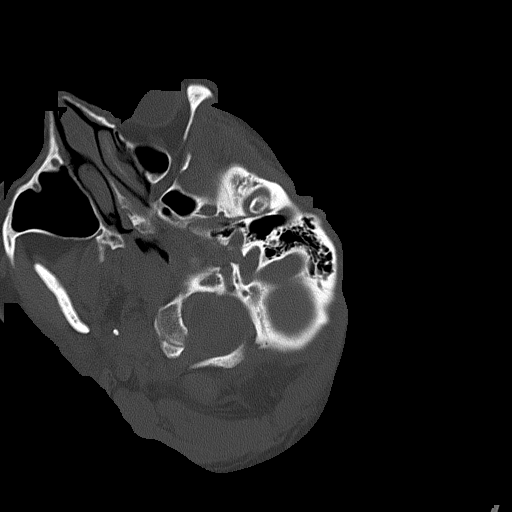
[im 16/78  bone]
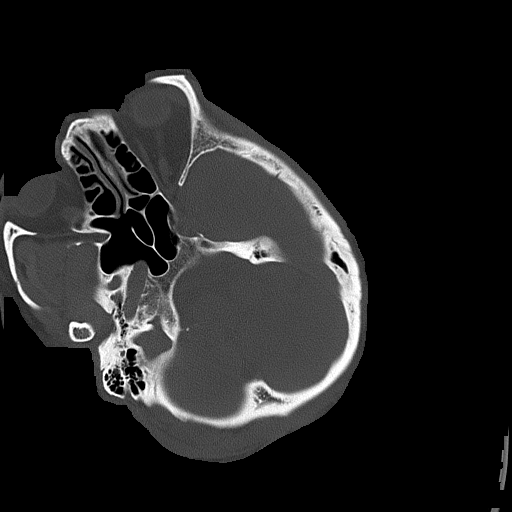
[im 24/78  bone]
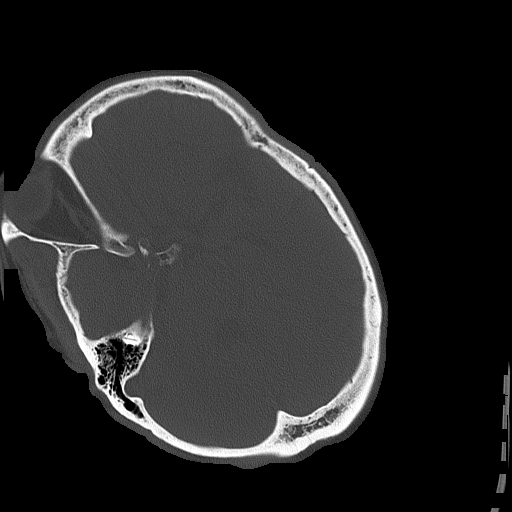
[im 31/78  bone]
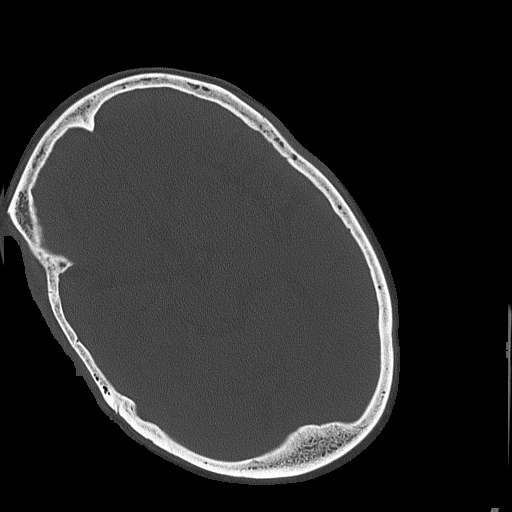
[im 47/78  bone]
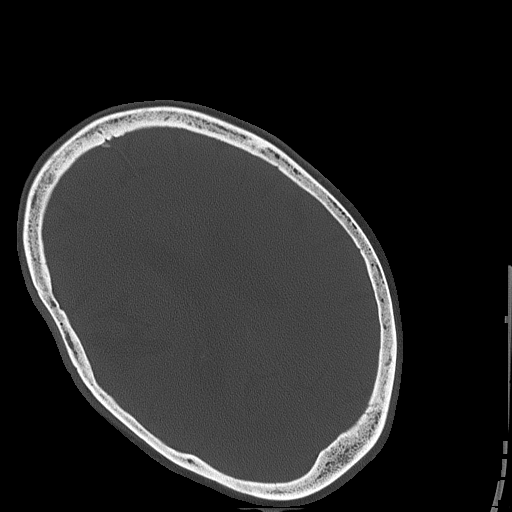
[im 54/78  bone]
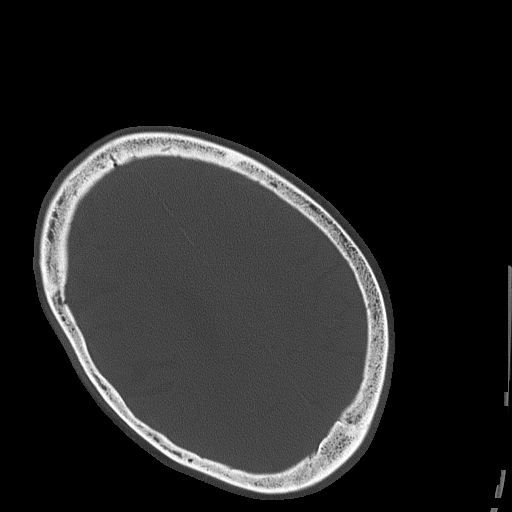
[im 62/78  bone]
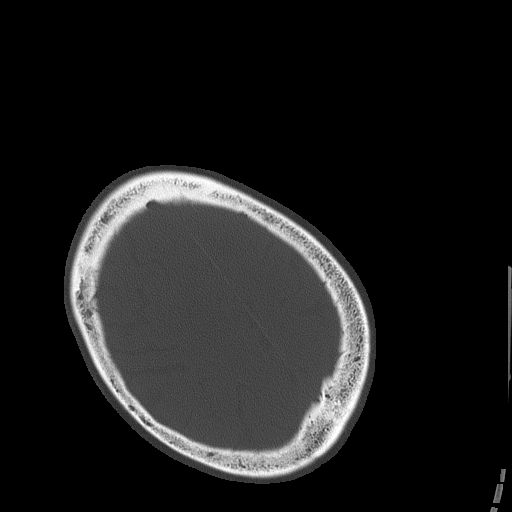
[im 70/78  bone]
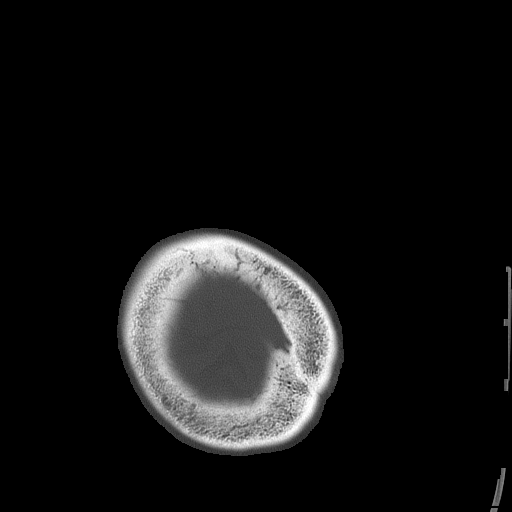

[Series 7: head without ax · axial · non-contrast · 0.32mm/px · z∈[+1201,+1251]mm · 2 of 32 slices shown]
[im 11/32  brain]
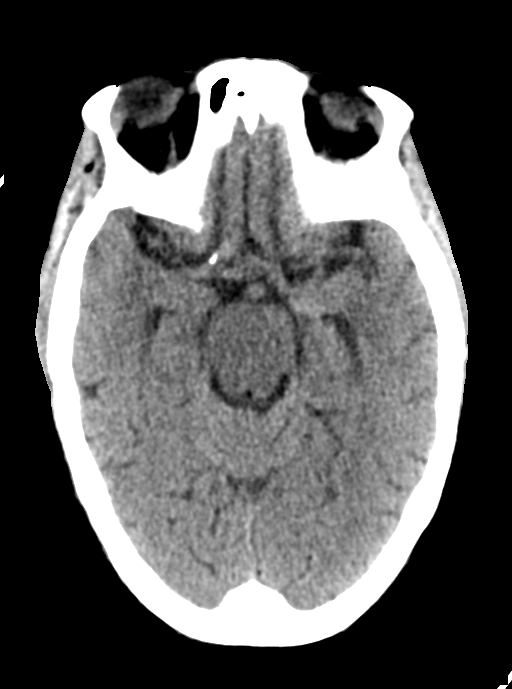
[im 21/32  brain]
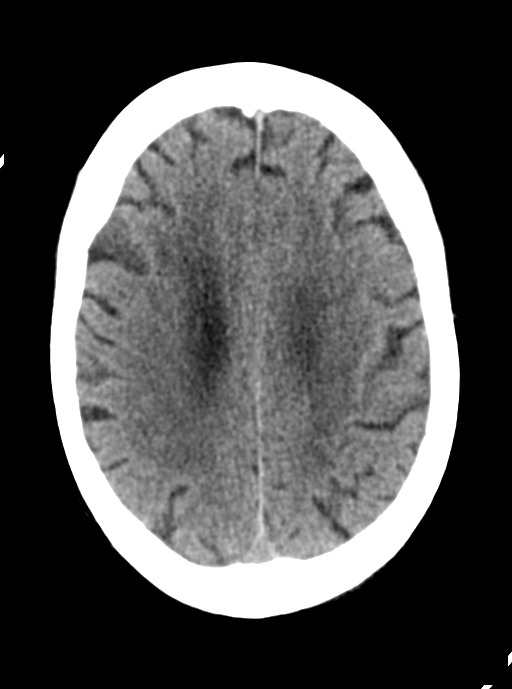

[Series 8: ax head bone · axial · 0.32mm/px · z∈[+1166,+1212]mm · 4 of 78 slices shown]
[im 8/78  bone]
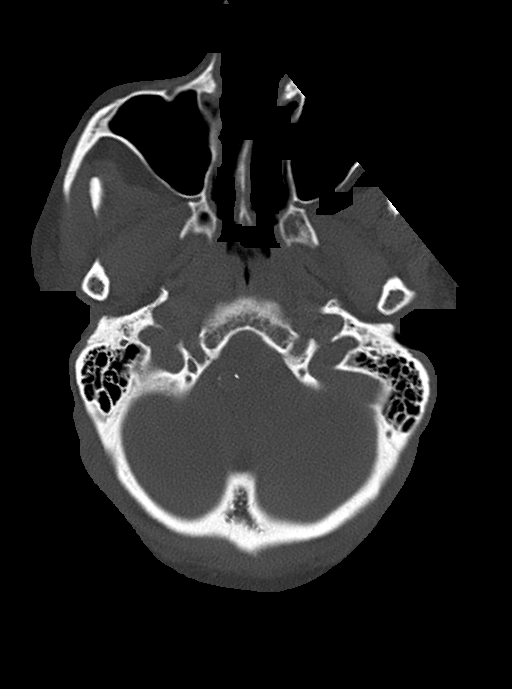
[im 16/78  bone]
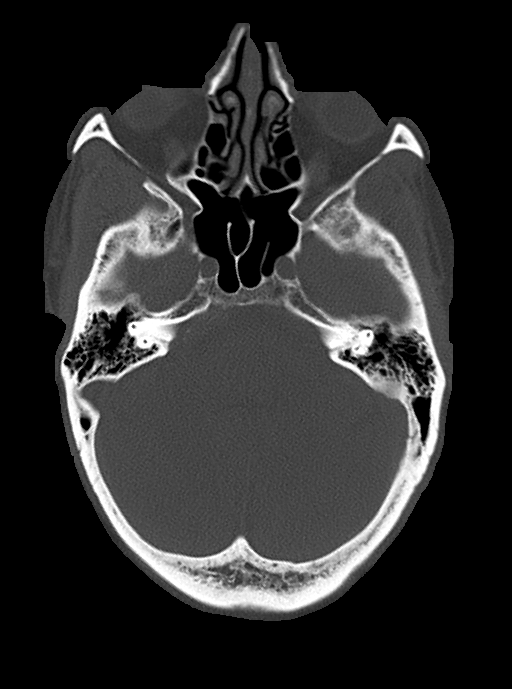
[im 24/78  bone]
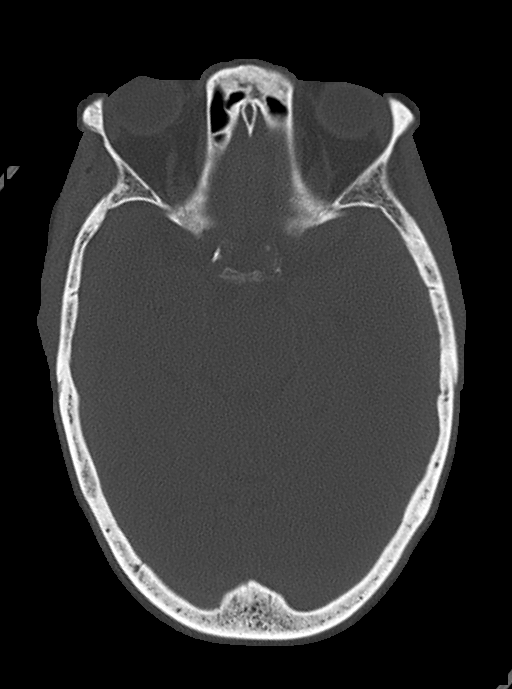
[im 31/78  bone]
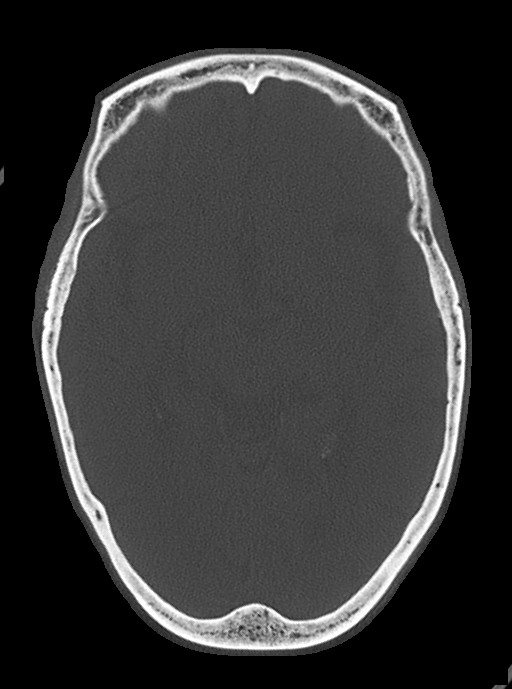

[16 of 30 positions shown; findings below may reference images not displayed]

FINDINGS: Brain: There is no evidence of acute intracranial hemorrhage,
extra-axial fluid collection, or acute infarct.

Foci of hypodensity in the subcortical and periventricular white
matter are again seen, likely reflecting sequela of chronic white
matter microangiopathy. The ventricles are stable in size. There is
no mass lesion. There is no midline shift.

Vascular: There is calcification of the bilateral cavernous ICAs.

Skull: Normal. Negative for fracture or focal lesion.

Sinuses/Orbits: The imaged paranasal sinuses are clear. Bilateral
lens implants are in place. The globes and orbits are otherwise
unremarkable.

Other: None.
IMPRESSION: No acute intracranial pathology.

## 2023-01-21 IMAGING — DX DG CHEST 1V PORT
1 series · 1 of 1 positions shown · non-contrast
Comparison: 06/24/2021

CLINICAL DATA: Altered mental status

EXAM:
PORTABLE CHEST 1 VIEW

[chest ap]
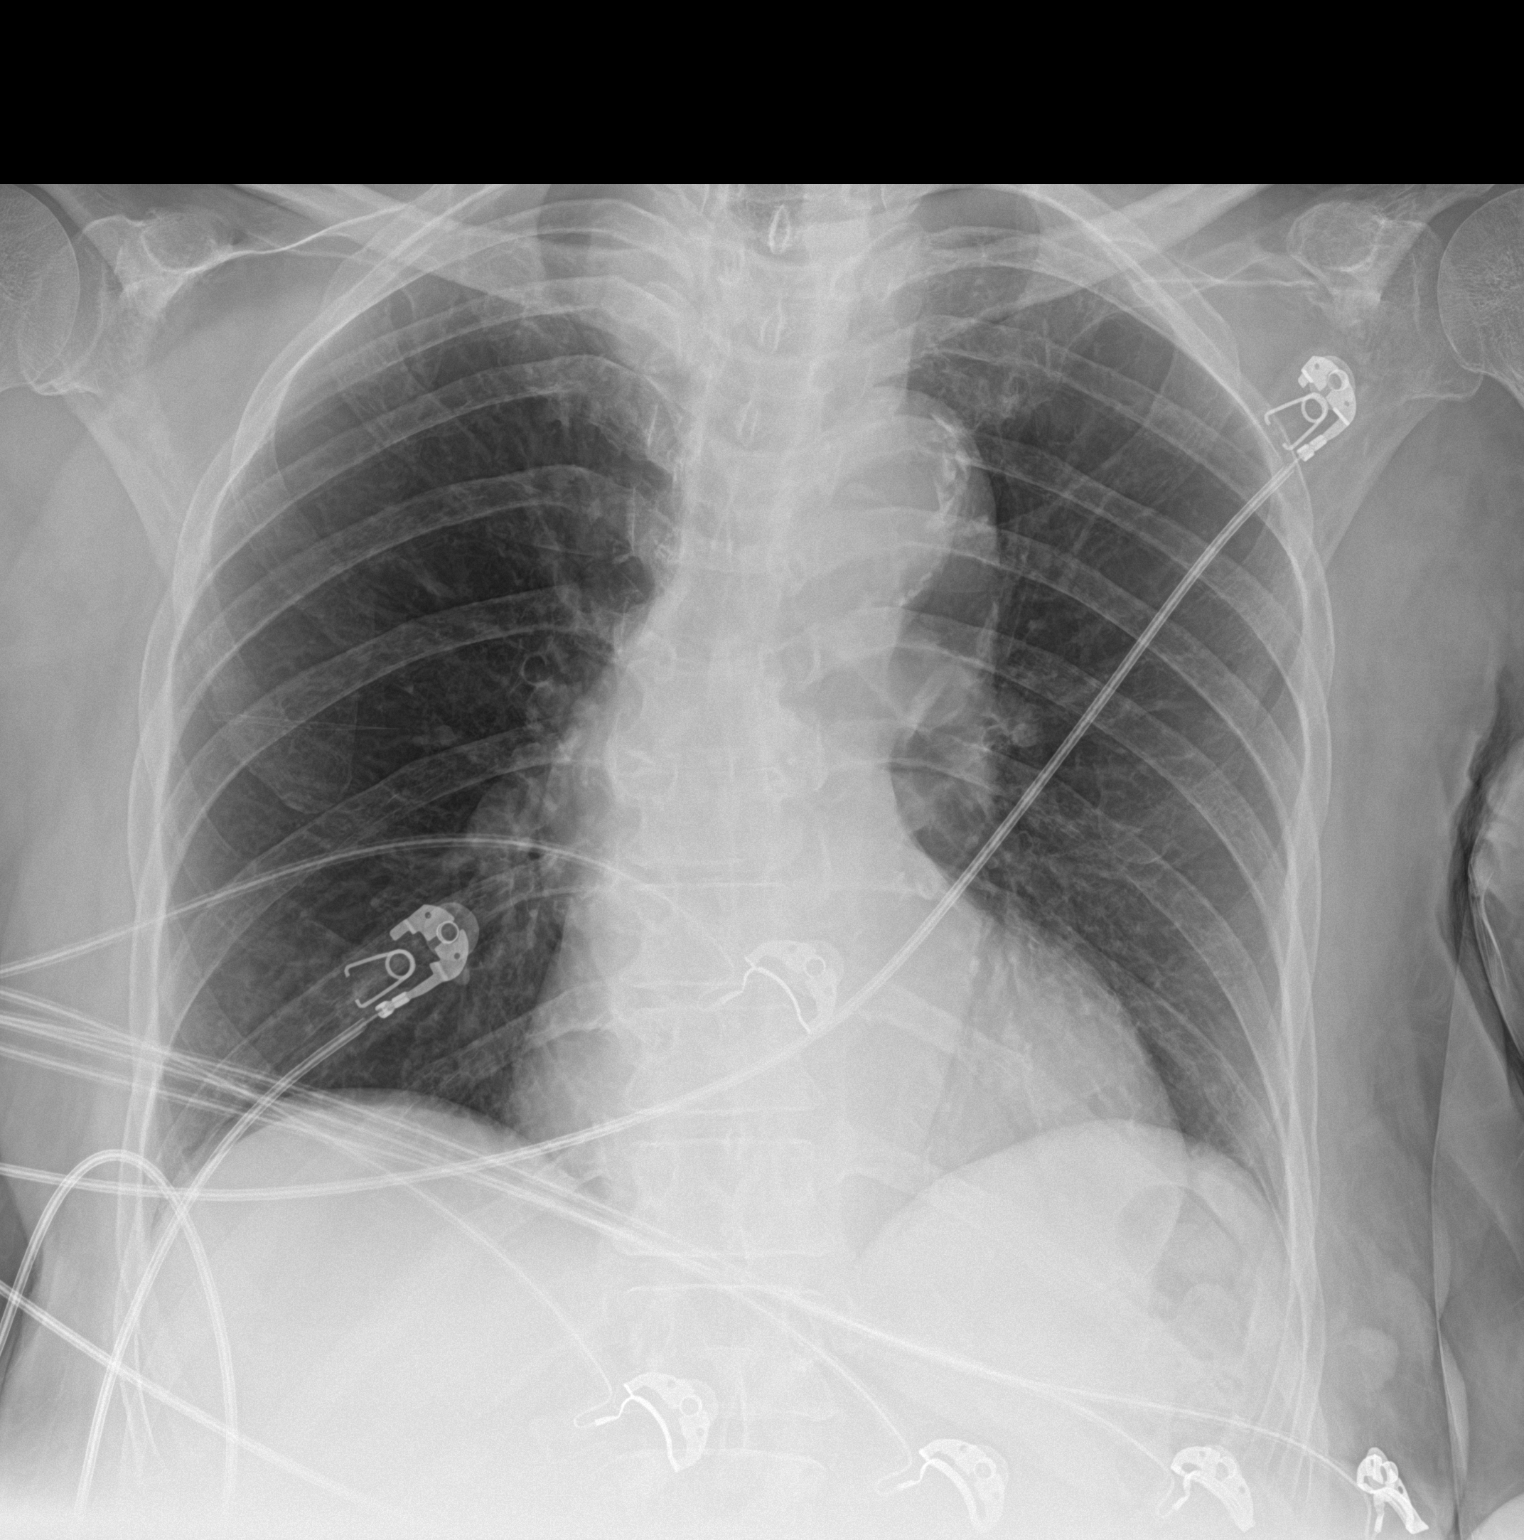

[1 of 1 positions shown; findings below may reference images not displayed]

FINDINGS: Cardiac shadow is mildly prominent but stable. Aortic calcifications
are again seen and stable. Postsurgical changes are noted in the
cervical spine. Lungs are clear bilaterally. Previously seen
radiopaque density over the mid chest is no longer identified and
likely extrinsic to the patient.
IMPRESSION: No acute abnormality noted.

## 2023-03-23 NOTE — Telephone Encounter (Signed)
Error
